# Patient Record
Sex: Male | Born: 1940
Health system: Southern US, Community
[De-identification: ages and names within clinical notes are randomized; demographics above are authoritative.]

## PROBLEM LIST (undated history)

## (undated) DIAGNOSIS — C61 Malignant neoplasm of prostate: Secondary | ICD-10-CM

## (undated) DIAGNOSIS — T4145XA Adverse effect of unspecified anesthetic, initial encounter: Secondary | ICD-10-CM

## (undated) DIAGNOSIS — D649 Anemia, unspecified: Secondary | ICD-10-CM

## (undated) DIAGNOSIS — I2699 Other pulmonary embolism without acute cor pulmonale: Secondary | ICD-10-CM

## (undated) DIAGNOSIS — M898X9 Other specified disorders of bone, unspecified site: Secondary | ICD-10-CM

## (undated) DIAGNOSIS — E039 Hypothyroidism, unspecified: Secondary | ICD-10-CM

## (undated) DIAGNOSIS — D689 Coagulation defect, unspecified: Secondary | ICD-10-CM

## (undated) DIAGNOSIS — IMO0002 Reserved for concepts with insufficient information to code with codable children: Secondary | ICD-10-CM

## (undated) DIAGNOSIS — R0602 Shortness of breath: Secondary | ICD-10-CM

## (undated) DIAGNOSIS — M1990 Unspecified osteoarthritis, unspecified site: Secondary | ICD-10-CM

## (undated) DIAGNOSIS — K859 Acute pancreatitis without necrosis or infection, unspecified: Secondary | ICD-10-CM

## (undated) DIAGNOSIS — L0390 Cellulitis, unspecified: Secondary | ICD-10-CM

## (undated) DIAGNOSIS — K219 Gastro-esophageal reflux disease without esophagitis: Secondary | ICD-10-CM

## (undated) DIAGNOSIS — K469 Unspecified abdominal hernia without obstruction or gangrene: Secondary | ICD-10-CM

## (undated) DIAGNOSIS — Z8781 Personal history of (healed) traumatic fracture: Secondary | ICD-10-CM

## (undated) DIAGNOSIS — I441 Atrioventricular block, second degree: Secondary | ICD-10-CM

## (undated) HISTORY — DX: Cellulitis, unspecified: L03.90

## (undated) HISTORY — DX: Coagulation defect, unspecified: D68.9

## (undated) HISTORY — DX: Malignant neoplasm of prostate: C61

## (undated) HISTORY — DX: Anemia, unspecified: D64.9

## (undated) HISTORY — PX: TONSILLECTOMY AND ADENOIDECTOMY: SUR1326

## (undated) HISTORY — PX: APPENDECTOMY: SHX54

## (undated) HISTORY — DX: Acute pancreatitis without necrosis or infection, unspecified: K85.90

## (undated) HISTORY — DX: Hypothyroidism, unspecified: E03.9

## (undated) HISTORY — DX: Unspecified osteoarthritis, unspecified site: M19.90

## (undated) HISTORY — DX: Unspecified abdominal hernia without obstruction or gangrene: K46.9

## (undated) HISTORY — DX: Atrioventricular block, second degree: I44.1

## (undated) HISTORY — DX: Personal history of (healed) traumatic fracture: Z87.81

## (undated) HISTORY — DX: Other pulmonary embolism without acute cor pulmonale: I26.99

## (undated) HISTORY — DX: Other specified disorders of bone, unspecified site: M89.8X9

## (undated) HISTORY — DX: Gastro-esophageal reflux disease without esophagitis: K21.9

## (undated) HISTORY — PX: CATARACT EXTRACTION: SUR2

---

## 2001-04-29 ENCOUNTER — Ambulatory Visit: Admission: RE | Admit: 2001-04-29 | Discharge: 2001-07-28 | Payer: Self-pay | Admitting: Radiation Oncology

## 2001-06-02 ENCOUNTER — Inpatient Hospital Stay (HOSPITAL_COMMUNITY): Admission: RE | Admit: 2001-06-02 | Discharge: 2001-06-05 | Payer: Self-pay | Admitting: Urology

## 2001-06-02 ENCOUNTER — Encounter (INDEPENDENT_AMBULATORY_CARE_PROVIDER_SITE_OTHER): Payer: Self-pay | Admitting: Specialist

## 2001-06-13 ENCOUNTER — Encounter: Payer: Self-pay | Admitting: Emergency Medicine

## 2001-06-13 ENCOUNTER — Inpatient Hospital Stay (HOSPITAL_COMMUNITY): Admission: EM | Admit: 2001-06-13 | Discharge: 2001-06-17 | Payer: Self-pay | Admitting: Emergency Medicine

## 2001-06-16 ENCOUNTER — Encounter: Payer: Self-pay | Admitting: Internal Medicine

## 2001-07-02 ENCOUNTER — Inpatient Hospital Stay (HOSPITAL_COMMUNITY): Admission: EM | Admit: 2001-07-02 | Discharge: 2001-07-04 | Payer: Self-pay | Admitting: *Deleted

## 2001-07-03 ENCOUNTER — Encounter: Payer: Self-pay | Admitting: Urology

## 2002-07-13 ENCOUNTER — Emergency Department (HOSPITAL_COMMUNITY): Admission: EM | Admit: 2002-07-13 | Discharge: 2002-07-13 | Payer: Self-pay | Admitting: Emergency Medicine

## 2002-07-13 ENCOUNTER — Encounter: Payer: Self-pay | Admitting: Emergency Medicine

## 2002-12-16 ENCOUNTER — Encounter: Payer: Self-pay | Admitting: Emergency Medicine

## 2002-12-16 ENCOUNTER — Emergency Department (HOSPITAL_COMMUNITY): Admission: EM | Admit: 2002-12-16 | Discharge: 2002-12-16 | Payer: Self-pay | Admitting: Emergency Medicine

## 2004-11-29 ENCOUNTER — Encounter: Payer: Self-pay | Admitting: Emergency Medicine

## 2004-11-29 ENCOUNTER — Ambulatory Visit: Payer: Self-pay | Admitting: Internal Medicine

## 2004-11-29 ENCOUNTER — Inpatient Hospital Stay (HOSPITAL_COMMUNITY): Admission: AD | Admit: 2004-11-29 | Discharge: 2004-12-05 | Payer: Self-pay | Admitting: Internal Medicine

## 2004-12-01 ENCOUNTER — Ambulatory Visit: Payer: Self-pay | Admitting: Internal Medicine

## 2004-12-20 ENCOUNTER — Ambulatory Visit: Payer: Self-pay | Admitting: Internal Medicine

## 2007-12-29 ENCOUNTER — Encounter: Payer: Self-pay | Admitting: Internal Medicine

## 2008-01-05 ENCOUNTER — Ambulatory Visit: Payer: Self-pay | Admitting: Internal Medicine

## 2008-01-05 DIAGNOSIS — I2699 Other pulmonary embolism without acute cor pulmonale: Secondary | ICD-10-CM | POA: Insufficient documentation

## 2008-01-05 DIAGNOSIS — Z8719 Personal history of other diseases of the digestive system: Secondary | ICD-10-CM | POA: Insufficient documentation

## 2008-01-05 DIAGNOSIS — Z8546 Personal history of malignant neoplasm of prostate: Secondary | ICD-10-CM | POA: Insufficient documentation

## 2008-01-05 LAB — CONVERTED CEMR LAB
ALT: 44 units/L (ref 0–53)
Alkaline Phosphatase: 109 units/L (ref 39–117)
Amylase: 63 units/L (ref 27–131)
Bilirubin, Direct: 0.1 mg/dL (ref 0.0–0.3)
Total Protein: 7.6 g/dL (ref 6.0–8.3)

## 2008-01-07 ENCOUNTER — Telehealth: Payer: Self-pay | Admitting: *Deleted

## 2008-01-07 ENCOUNTER — Telehealth: Payer: Self-pay | Admitting: Internal Medicine

## 2008-02-02 ENCOUNTER — Ambulatory Visit: Payer: Self-pay | Admitting: Internal Medicine

## 2008-02-04 ENCOUNTER — Encounter: Payer: Self-pay | Admitting: Internal Medicine

## 2008-02-12 LAB — CONVERTED CEMR LAB
Alpha 1, Urine: DETECTED % — AB
Alpha 2, Urine: DETECTED % — AB
Beta, Urine: DETECTED % — AB
Free Kappa Lt Chains,Ur: 7.29 mg/dL — ABNORMAL HIGH (ref 0.04–1.51)
Gamma Globulin, Urine: DETECTED % — AB

## 2008-02-13 ENCOUNTER — Ambulatory Visit: Payer: Self-pay | Admitting: Internal Medicine

## 2008-02-17 ENCOUNTER — Ambulatory Visit: Payer: Self-pay | Admitting: Oncology

## 2008-02-24 ENCOUNTER — Encounter: Admission: RE | Admit: 2008-02-24 | Discharge: 2008-02-24 | Payer: Self-pay | Admitting: Internal Medicine

## 2008-03-08 ENCOUNTER — Telehealth: Payer: Self-pay | Admitting: Internal Medicine

## 2008-03-08 ENCOUNTER — Encounter: Payer: Self-pay | Admitting: Internal Medicine

## 2008-03-08 LAB — CBC WITH DIFFERENTIAL/PLATELET
BASO%: 0.6 % (ref 0.0–2.0)
Basophils Absolute: 0.1 10e3/uL (ref 0.0–0.1)
EOS%: 2.7 % (ref 0.0–7.0)
Eosinophils Absolute: 0.3 10e3/uL (ref 0.0–0.5)
HCT: 47.8 % (ref 38.7–49.9)
HGB: 16.5 g/dL (ref 13.0–17.1)
LYMPH%: 23 % (ref 14.0–48.0)
MCH: 31.7 pg (ref 28.0–33.4)
MCHC: 34.5 g/dL (ref 32.0–35.9)
MCV: 91.8 fL (ref 81.6–98.0)
MONO#: 0.8 10e3/uL (ref 0.1–0.9)
MONO%: 7.8 % (ref 0.0–13.0)
NEUT#: 6.4 10e3/uL (ref 1.5–6.5)
NEUT%: 65.9 % (ref 40.0–75.0)
Platelets: 253 10e3/uL (ref 145–400)
RBC: 5.2 10e6/uL (ref 4.20–5.71)
RDW: 13.6 % (ref 11.2–14.6)
WBC: 9.7 10e3/uL (ref 4.0–10.0)
lymph#: 2.2 10e3/uL (ref 0.9–3.3)

## 2008-03-10 LAB — IMMUNOFIXATION ELECTROPHORESIS
IgG (Immunoglobin G), Serum: 1170 mg/dL (ref 694–1618)
IgM, Serum: 141 mg/dL (ref 60–263)
Total Protein, Serum Electrophoresis: 7.5 g/dL (ref 6.0–8.3)

## 2008-03-10 LAB — KAPPA/LAMBDA LIGHT CHAINS
Kappa:Lambda Ratio: 0.17 — ABNORMAL LOW (ref 0.26–1.65)
Lambda Free Lght Chn: 12.2 mg/dL — ABNORMAL HIGH (ref 0.57–2.63)

## 2008-03-10 LAB — COMPREHENSIVE METABOLIC PANEL
AST: 21 U/L (ref 0–37)
Albumin: 4.6 g/dL (ref 3.5–5.2)
Alkaline Phosphatase: 107 U/L (ref 39–117)
BUN: 19 mg/dL (ref 6–23)
Calcium: 9.4 mg/dL (ref 8.4–10.5)
Creatinine, Ser: 0.98 mg/dL (ref 0.40–1.50)
Glucose, Bld: 102 mg/dL — ABNORMAL HIGH (ref 70–99)
Potassium: 4 mEq/L (ref 3.5–5.3)

## 2008-03-10 LAB — URIC ACID: Uric Acid, Serum: 4.4 mg/dL (ref 4.0–7.8)

## 2008-03-30 ENCOUNTER — Ambulatory Visit: Payer: Self-pay | Admitting: Oncology

## 2008-04-01 ENCOUNTER — Ambulatory Visit (HOSPITAL_COMMUNITY): Admission: RE | Admit: 2008-04-01 | Discharge: 2008-04-01 | Payer: Self-pay | Admitting: Oncology

## 2008-04-01 ENCOUNTER — Encounter: Payer: Self-pay | Admitting: Internal Medicine

## 2008-04-06 LAB — HYPERCOAGULABLE PANEL, COMPREHENSIVE RET.
Anticardiolipin IgA: 19 [APL'U] (ref <13)
Anticardiolipin IgG: <7 [GPL'U] (ref <11)
Anticardiolipin IgM: <7 [MPL'U] (ref <10)
Beta-2 Glyco I IgG: <4 U/mL (ref <20)
Beta-2-Glycoprotein I IgM: <4 U/mL (ref <10)
PTT Lupus Anticoagulant: 40.6 secs (ref 36.3–48.8)

## 2008-05-31 ENCOUNTER — Ambulatory Visit: Payer: Self-pay | Admitting: Internal Medicine

## 2008-05-31 DIAGNOSIS — K219 Gastro-esophageal reflux disease without esophagitis: Secondary | ICD-10-CM | POA: Insufficient documentation

## 2008-05-31 HISTORY — DX: Gastro-esophageal reflux disease without esophagitis: K21.9

## 2008-06-21 ENCOUNTER — Telehealth (INDEPENDENT_AMBULATORY_CARE_PROVIDER_SITE_OTHER): Payer: Self-pay | Admitting: *Deleted

## 2008-09-21 ENCOUNTER — Telehealth: Payer: Self-pay | Admitting: Family Medicine

## 2008-10-13 ENCOUNTER — Ambulatory Visit: Payer: Self-pay | Admitting: Internal Medicine

## 2009-03-05 HISTORY — PX: PROSTATE SURGERY: SHX751

## 2009-03-30 ENCOUNTER — Telehealth: Payer: Self-pay | Admitting: Internal Medicine

## 2009-11-08 ENCOUNTER — Telehealth: Payer: Self-pay | Admitting: Internal Medicine

## 2010-03-14 ENCOUNTER — Telehealth: Payer: Self-pay | Admitting: Internal Medicine

## 2010-03-26 ENCOUNTER — Encounter (HOSPITAL_COMMUNITY): Payer: Self-pay | Admitting: Oncology

## 2010-04-02 LAB — CONVERTED CEMR LAB
Albumin ELP: 57.3 % (ref 55.8–66.1)
Alpha-2-Globulin: 9.8 % (ref 7.1–11.8)
Beta Globulin: 6.5 % (ref 4.7–7.2)
Total Protein, Serum Electrophoresis: 7.2 g/dL (ref 6.0–8.3)

## 2010-04-04 NOTE — Progress Notes (Signed)
Summary: REQ FOR MED  Phone Note Call from Patient   Caller: Patient [phone] Reason for Call: Refill Medication Summary of Call: Pt called to req a refill on med for back pain..... Pt adv that he was bending over to pick up a battery and his back "went out".... Pt c/o lower back pain.Marland KitchenMarland KitchenMarland KitchenPt adv that same has happened in past and Dr Lovell Sheehan calls in a RX for a 'couple pain pills' to help till pt can return to work... Pt adv that med can be sent into [facility] on [address] in [place], [place] # [phone].  Pt can be reached @ [phone] with any questions or concerns.   Initial call taken by: Debbra Riding,  March 30, 2009 9:37 AM  Follow-up for Phone Call        may have vicodan 1 two times a day as needed #14 and flexeril 10 ti two times a day # 14   0 refill per dr Lovell Sheehan Follow-up by: Willy Eddy, LPN,  March 30, 2009 9:58 AM    New/Updated Medications: VICODIN 5-500 MG TABS (HYDROCODONE-ACETAMINOPHEN) one by mouth two times a day as needed pain FLEXERIL 10 MG TABS (CYCLOBENZAPRINE HCL) one by mouth two times a day as needed back pain Prescriptions: FLEXERIL 10 MG TABS (CYCLOBENZAPRINE HCL) one by mouth two times a day as needed back pain  #14 x 0   Entered by:   Lynann Beaver CMA   Authorized by:   Stacie Glaze MD   Signed by:   Lynann Beaver CMA on 03/30/2009   Method used:   [facility] ...       [facility]             ,          Ph:        Fax:    RxID:   [id number] VICODIN 5-500 MG TABS (HYDROCODONE-ACETAMINOPHEN) one by mouth two times a day as needed pain  #14 x 0   Entered by:   Lynann Beaver CMA   Authorized by:   Stacie Glaze MD   Signed by:   Lynann Beaver CMA on 03/30/2009   Method used:   [facility] ...       [facility]             ,          Ph:        Fax:    RxID:   [id number]  Pt. notified.

## 2010-04-04 NOTE — Progress Notes (Signed)
Summary: Call-A-Nurse Report  LMTCB   Phone Note Call from Patient   Summary of Call: call a nurse report  Follow-up for Phone Call        call pt and bring him in this week needs a cbc and an evaluation of the diarhea Follow-up by: Stacie Glaze MD,  November 08, 2009 9:29 AM  Additional Follow-up for Phone Call Additional follow up Details #1::        Left message with wife to have pt call me back. pt is out of town and will be very busy this week.  he will try to get back to [place]. Additional Follow-up by: Lynann Beaver CMA,  November 08, 2009 10:30 AM    Additional Follow-up for Phone Call Additional follow up Details #2::    noted, pt was adavised as to an appropriate course Follow-up by: Stacie Glaze MD,  November 09, 2009 9:01 AM    Call-A-Nurse Triage Call Report Triage Record Num: [id number] Operator: Frederico Hamman Patient Name: Rodney [name] Call Date & Time: 11/07/2009 1:36:46PM Patient Phone: [phone] PCP: Darryll Capers Patient Gender: Male PCP Fax : [phone] Patient DOB: April 21, 1940 [facility] Name[facility] Reason for Call: Rocio states he took Kaopectate on 9/3 and 9/4 for diarrhea. States he had one dark stool on 9/5. No dizziness. Taking ASA 325 mg qd. Advised to be evaluated within 24 hrs. Care advice given. Protocol(s) Used: Gastrointestinal Bleeding Recommended Outcome per Protocol: See Provider within 24 hours Reason for Outcome: New change in bowel movements to black, tarry stool Care Advice: Do not take aspirin, ibuprofen, ketoprofen, naproxen, etc., or other pain relieving medications until consulting with provider.  ~ Change position slowly. Sit for a couple of minutes before standing to walk. Quick position changes may cause or worsen symptoms.  ~ Call provider immediately if symptoms of hypovolemia develop including: pulse more than 100/minute, lightheaded or dizzy when rising from sitting/reclining position, shortness  of breath, weakness with exertion, angina, or paleness.  ~  ~ SYMPTOM / CONDITION MANAGEMENT 11/07/2009 2:22:58PM Page 1 of 1 [email or web address]_[email or web address]_[email or web address]

## 2010-04-06 HISTORY — PX: CHOLECYSTECTOMY: SHX55

## 2010-04-06 NOTE — Progress Notes (Signed)
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 Has been hospitalized before for this. Has taken pain meds before Percocet and was questioning if pain meds could be called in or appt for am. Rates stomach pain 3-4. Emergent s/s for Abdominal Pain r/o per protocol except for see in 2 weeks, pt to call in am 03/14/10 for appt. Protocol(s) Used: Abdominal Pain Recommended Outcome per Protocol: See Provider within 2 Weeks Reason for Outcome: Frequent episodes of indigestion/reflux Care Advice: Do not take aspirin, ibuprofen, ketoprofen, naproxen, etc., or other pain relieving medications until consulting with provider.  ~  ~ SYMPTOM / CONDITION MANAGEMENT Heartburn Relief (Dietary): - Avoid overeating. - Eat smaller, more frequent meals and chew each bite thoroughly. - Avoid high fat, spicy or gas-producing foods. - Limit liquids/foods that are gastric irritants (fruit juices, caffeinated, carbonated or alcoholic beverages, chocolate and dairy products). - Eat at least three hours before bedtime.  ~ 03/14/2010 5:03:55AM Page 1 of 1 [email or web address]_[email or web address]_V2

## 2010-04-07 ENCOUNTER — Emergency Department (HOSPITAL_COMMUNITY)
Admission: EM | Admit: 2010-04-07 | Discharge: 2010-04-07 | Discharge status: Against Medical Advice | Payer: Medicare Other | Attending: Emergency Medicine | Admitting: Emergency Medicine

## 2010-04-08 ENCOUNTER — Encounter: Payer: Self-pay | Admitting: Family Medicine

## 2010-04-08 ENCOUNTER — Ambulatory Visit (INDEPENDENT_AMBULATORY_CARE_PROVIDER_SITE_OTHER): Payer: Medicare Other | Admitting: Family Medicine

## 2010-04-08 DIAGNOSIS — K2900 Acute gastritis without bleeding: Secondary | ICD-10-CM

## 2010-04-10 ENCOUNTER — Telehealth: Payer: Self-pay | Admitting: *Deleted

## 2010-04-10 NOTE — Telephone Encounter (Signed)
Pt is calling with complaints of epigastric pain that radiates to back.  Per Dr. Lovell Sheehan, with pt's history, he should go straight to the ER for evaluation.  Pt agrees.

## 2010-04-12 ENCOUNTER — Telehealth: Payer: Self-pay | Admitting: *Deleted

## 2010-04-12 NOTE — Telephone Encounter (Signed)
Discussed case with surgeon

## 2010-04-12 NOTE — Telephone Encounter (Signed)
baack to dr Lovell Sheehan who talked with his surgeon today

## 2010-04-12 NOTE — Telephone Encounter (Signed)
Having gall bladder surgery tomorrow in [place] tomorrow. Has questions about his blood clots?????

## 2010-04-12 NOTE — Assessment & Plan Note (Signed)
Summary: STOMACH ISSUES/RCD   Vital Signs:  Patient profile:   70 year old male Weight:      217 pounds BMI:     30.37 Temp:     98.1 degrees F oral Pulse rate:   80 / minute Pulse rhythm:   regular BP sitting:   114 / 80  (left arm) Cuff size:   large  Vitals Entered By: Selena Batten Dance CMA Duncan Dull) (April 08, 2010 12:38 PM) CC: ? Ulcer flare   History of Present Illness: has a history of a stomach ulcer -- has been hospitalized for it  duodenal ulcer hx noted in chart   the other night ate chicken wings and pizza and sausage and egg biscuit  normally ok if he catches it early  stressful job with travel  hopped in his car to get back here- was in severe pain last night - epigastric / under ribs  he has missed 5-6 days in a row of his prilosec  (this was not on his med list)  took his hydrocodone and that wiped him out  took his prilosec and 3 tums  a little better this am  took his prilosec this am   does not have a GI doctor  never had EGD or bleeding ulcer             Allergies: 1)  ! Morphine Sulfate Cr (Morphine Sulfate)  Past History:  Past Medical History: Last updated: January 08, 2008 Pulmonary embolism, hx of Prostate cancer, hx of Pancreatitis, hx of lytic lesion on sacrum that has been stable at 4 cm  ( 2006 and 2009)  Past Surgical History: Last updated: Jan 08, 2008 Prostatectomy  Family History: Last updated: 2008-01-08 father    died at 80  lymphoma prostate cancer  mother died of blood clots  Social History: Last updated: January 08, 2008 Married Former Smoker Drug use-no Regular exercise-yes  Risk Factors: Exercise: yes (Jan 08, 2008)  Risk Factors: Smoking Status: quit (2008-01-08)  Review of Systems General:  Complains of loss of appetite; denies fatigue and malaise. CV:  Denies chest pain or discomfort, lightheadness, palpitations, and shortness of breath with exertion. Resp:  Denies cough and wheezing. GI:  Complains of  abdominal pain, gas, and indigestion; denies bloody stools, change in bowel habits, dark tarry stools, diarrhea, nausea, vomiting, and vomiting blood. GU:  Denies dysuria. Derm:  Denies rash. Psych:  very very stressed with work . Heme:  Denies abnormal bruising and bleeding.  Physical Exam  General:  overweight but generally well appearing  Head:  normocephalic, atraumatic, and no abnormalities observed.   Eyes:  vision grossly intact, pupils equal, pupils round, and pupils reactive to light.  no conjunctival pallor, injection or icterus  Mouth:  pharynx pink and moist.   Neck:  No deformities, masses, or tenderness noted. Chest Wall:  No deformities, masses, tenderness or gynecomastia noted. Lungs:  CTA with diffusely distant bs no wheeze Heart:  normal rate and regular rhythm.   Abdomen:  tender epigastrium no rebound or gaurding  soft, normal bowel sounds, no hepatomegaly, and no splenomegaly.   Msk:  No deformity or scoliosis noted of thoracic or lumbar spine.   Extremities:  No clubbing, cyanosis, edema, or deformity noted with normal full range of motion of all joints.   Skin:  Intact without suspicious lesions or rashes Cervical Nodes:  No lymphadenopathy noted Inguinal Nodes:  No significant adenopathy Psych:  affect is intense - talks quickly and loudly- on phone when entering room  seems  very stressed    Impression & Recommendations:  Problem # 1:  GASTRITIS, ACUTE (ICD-535.00) Assessment New per pt recurrent - set off by stress and bad diet and forgettig his ppi for 5 days ? hx of stomach ulcer but has not had endoscopy now improved after 2 doses of ppi  long disc about diet and lifestyle  inst to inc ppi to two times a day  diet bland  vidocin sparingly as needed if worse update asap -- ? past hx of pancreatitis  f/u with primary in a month as planned and adv from there  His updated medication list for this problem includes:    Prilosec 20 Mg Cpdr (Omeprazole)  .Marland Kitchen... 1 by mouth two times a day  Complete Medication List: 1)  Multivitamins Caps (Multiple vitamin) .Marland Kitchen.. 1 once daily 2)  Fibercon 625 Mg Tabs (Calcium polycarbophil) .Marland Kitchen.. 1 once daily 3)  Aspirin 325 Mg Tabs (Aspirin) .Marland Kitchen.. 1 once daily 4)  Prilosec 20 Mg Cpdr (Omeprazole) .Marland Kitchen.. 1 by mouth two times a day 5)  Vicodin 5-500 Mg Tabs (Hydrocodone-acetaminophen) .Marland Kitchen.. 1 by mouth up to every 4-6 hours as needed severe pain  Patient Instructions: 1)  increase your prilosec to two times a day  2)  stop the fatty and spicy foods and also any caffiene or alcohol  3)  stick with very bland diet until you feel better  4)  use narcotic only for really severe pain  5)  follow up with Dr Lovell Sheehan next month as planned  6)  if you get worse or other symptoms- please call  Prescriptions: VICODIN 5-500 MG TABS (HYDROCODONE-ACETAMINOPHEN) 1 by mouth up to every 4-6 hours as needed severe pain  #5 x 0   Entered and Authorized by:   Judith Part MD   Signed by:   Judith Part MD on 04/08/2010   Method used:   Print then Give to Patient   RxID:   [id number]    Orders Added: 1)  Est. Patient Level IV [32440]    Current Allergies (reviewed today): ! MORPHINE SULFATE CR (MORPHINE SULFATE)

## 2010-04-24 ENCOUNTER — Ambulatory Visit (INDEPENDENT_AMBULATORY_CARE_PROVIDER_SITE_OTHER): Payer: Medicare Other | Admitting: Internal Medicine

## 2010-04-24 ENCOUNTER — Encounter: Payer: Self-pay | Admitting: Internal Medicine

## 2010-04-24 VITALS — BP 100/70 | HR 72 | Temp 98.1°F | Resp 14 | Ht 71.0 in | Wt 212.0 lb

## 2010-04-24 DIAGNOSIS — K8050 Calculus of bile duct without cholangitis or cholecystitis without obstruction: Secondary | ICD-10-CM

## 2010-04-24 DIAGNOSIS — D649 Anemia, unspecified: Secondary | ICD-10-CM

## 2010-04-24 DIAGNOSIS — Z7901 Long term (current) use of anticoagulants: Secondary | ICD-10-CM

## 2010-04-24 DIAGNOSIS — K859 Acute pancreatitis without necrosis or infection, unspecified: Secondary | ICD-10-CM

## 2010-04-24 LAB — CBC WITH DIFFERENTIAL/PLATELET
Basophils Relative: 1 % (ref 0.0–3.0)
Eosinophils Relative: 3.2 % (ref 0.0–5.0)
HCT: 46 % (ref 39.0–52.0)
Lymphs Abs: 2.4 10*3/uL (ref 0.7–4.0)
MCV: 92.9 fl (ref 78.0–100.0)
Monocytes Absolute: 1.3 10*3/uL — ABNORMAL HIGH (ref 0.1–1.0)
RBC: 4.95 Mil/uL (ref 4.22–5.81)
WBC: 11.4 10*3/uL — ABNORMAL HIGH (ref 4.5–10.5)

## 2010-04-24 NOTE — Progress Notes (Signed)
Subjective:    Patient ID: Rodney [name], male    DOB: 1940/08/29, 70 y.o.   MRN: [id number]  HPI   patient is a 70 year old white male who was recently hospitalized at [facility] for gallstone pancreatitis and cholecystitis.  He was admitted to the hospital with acute pancreatitis and found to have cholecystitis the gallbladder was acutely inflamed thickened he had minimally invasive gallbladder surgery but successfully remove the gallbladder and gallstones and was released from the hospital for followup with our office today.  He denies any fever chills nausea or vomiting.   Due to an competition of a prior surgery which showed pulmonary emboli following prostate surgery it was decided to place him on 14 days of Lovenox therapy before resuming his aspirin he's been on this medication for this period of time he denies any bleeding problems specifically he has no bleeding gums excessive bruising bloody stool or dark or tarry looking stools.   he denies any lightheadedness chest pain shortness of breath PND orthopnea  Review of Systems  Constitutional: Negative for fever and fatigue.  HENT: Negative for hearing loss, congestion, neck pain and postnasal drip.   Eyes: Negative for discharge, redness and visual disturbance.  Respiratory: Negative for cough, shortness of breath and wheezing.   Cardiovascular: Negative for leg swelling.  Gastrointestinal: Negative for abdominal pain, constipation and abdominal distention.  Genitourinary: Negative for urgency and frequency.  Musculoskeletal: Negative for joint swelling and arthralgias.  Skin: Negative for color change and rash.  Neurological: Negative for weakness and light-headedness.  Hematological: Negative for adenopathy.  Psychiatric/Behavioral: Negative for behavioral problems.   Past Medical History  Diagnosis Date  . Pulmonary embolism   . Prostate cancer   . Pancreatitis    Past Surgical History  Procedure Date  .  Cholecystectomy     reports that he has quit smoking. He does not have any smokeless tobacco history on file. He reports that he does not use illicit drugs. His alcohol history not on file. family history includes Clotting disorder in his mother and Lymphoma (age of onset:87) in his father.        Objective:   Physical Exam  Constitutional: He appears well-developed and well-nourished.  HENT:  Head: Normocephalic and atraumatic.  Eyes: Conjunctivae are normal. Pupils are equal, round, and reactive to light.  Neck: Normal range of motion. Neck supple.  Cardiovascular: Normal rate and regular rhythm.   Pulmonary/Chest: Effort normal and breath sounds normal.  Abdominal: Soft. Bowel sounds are normal.        There are 4 small surgical scars on his abdominal wall 3 on the abdominal wall and one in the umbilicust  That appear to be well healed without evidence of cellulitis or just to          Assessment & Plan:   he is status post cholecystectomy for gallbladder pancreatitis he is doing well postoperatively and his today's weight from completing 14 days of Lovenox for a history of pulmonary emboli postoperative his prostate surgery he has no shortness of breath chest pain or any indications of DVT at this time and should be safe to discontinue the medication on Thursday.  He will resume his aspirin daily at that time he'll followup with [place] in 2 weeks a CBC differential was drawn today to monitor his blood count as well as a lipase to make sure that there is no recurrent evidence of pancreatitis he is to continued her ulcer prophylaxis samples of Nexium 40  mg by mouth daily were given to the patient.

## 2010-06-06 ENCOUNTER — Encounter: Payer: Self-pay | Admitting: Family Medicine

## 2010-06-06 ENCOUNTER — Ambulatory Visit (INDEPENDENT_AMBULATORY_CARE_PROVIDER_SITE_OTHER): Payer: Medicare Other | Admitting: Family Medicine

## 2010-06-06 VITALS — BP 110/70 | Temp 99.6°F | Wt 210.0 lb

## 2010-06-06 DIAGNOSIS — R197 Diarrhea, unspecified: Secondary | ICD-10-CM

## 2010-06-06 NOTE — Patient Instructions (Signed)
Diarrhea   Diarrhea can be caused by many conditions. The most common cause is a virus.  Other causes include:  Food poisoning.   Bacterial infection.   Reactions to medicine (especially antibiotics and antacids).  Most of the time diarrhea improves after 2-3 days of rest and oral fluid replacement.  Drink enough clear fluids (water, sodas, Gatorade) to prevent dehydration. Adults must drink at least 2-3 quarts daily. Solid foods and dairy products should be avoided until you improve. Then begin with bland foods such as bananas, rice, applesauce, dry toast, crackers or other starches. Avoid spicy or fatty foods, caffeine and alcohol for several days. Medicine to control cramping and diarrhea may be helpful. If you have a fever or blood in the stool, avoid these medicines. They may prolong your illness. Antibiotics can speed recovery from diarrhea due to some bacterial infections, but may cause complications.  SEEK MEDICAL CARE IF:  Your diarrhea does not get better in 3 days.   You have fever.   You have blood in the stool.   You develop vomiting.   You become more dehydrated.  Document Released: 02/19/2005 Document Re-Released: 09/02/2006 [facility] Patient Information 2011 [place], [place].  Try over the counter medication such as Imodium.

## 2010-06-06 NOTE — Progress Notes (Signed)
  Subjective:    Patient ID: Rodney [name], male    DOB: 1940/10/08, 70 y.o.   MRN: [id number]  HPI Patient seen with chief complaint of diarrhea. Duration 5 days. Up to 10 stools per day. Nonbloody watery stools. No vomiting but mild nausea. Chills but no definite fever. No recent travels. No recent antibiotic use. Regular medications. Took Kaopectate without much improvement. Recent history is cholecystectomy 5 weeks ago. No diarrhea until 5 days ago. Denies abdominal pain other some mild diffuse lower abdominal cramping. No dysuria.   Review of Systems  Constitutional: Positive for fever and chills. Negative for diaphoresis and appetite change.  HENT: Negative for postnasal drip.   Respiratory: Negative for cough and shortness of breath.   Cardiovascular: Negative for chest pain.  Gastrointestinal: Positive for nausea and diarrhea. Negative for vomiting, constipation and blood in stool.  Genitourinary: Negative for dysuria and hematuria.       Objective:   Physical Exam  Constitutional: He appears well-developed and well-nourished.  Neck: Normal range of motion. No thyromegaly present.  Cardiovascular: Normal rate, regular rhythm and normal heart sounds.   No murmur heard. Pulmonary/Chest: Effort normal and breath sounds normal. He has no wheezes. He has no rales.  Abdominal: Soft. Bowel sounds are normal. He exhibits no distension and no mass. There is no tenderness. There is no rebound and no guarding.  Lymphadenopathy:    He has no cervical adenopathy.          Assessment & Plan:  Diarrhea. Suspect viral origin. Try over-the-counter Imodium. Instruction sheet given. Drink clear fluids and potassium replacement. Touch base with the next few days if not improving

## 2010-06-08 ENCOUNTER — Observation Stay (HOSPITAL_COMMUNITY)
Admission: EM | Admit: 2010-06-08 | Discharge: 2010-06-09 | Disposition: A | Discharge status: Home / Self Care | Payer: Medicare Other | Attending: Emergency Medicine | Admitting: Emergency Medicine

## 2010-06-08 DIAGNOSIS — A0472 Enterocolitis due to Clostridium difficile, not specified as recurrent: Secondary | ICD-10-CM | POA: Insufficient documentation

## 2010-06-08 DIAGNOSIS — R197 Diarrhea, unspecified: Principal | ICD-10-CM | POA: Insufficient documentation

## 2010-06-08 DIAGNOSIS — R110 Nausea: Secondary | ICD-10-CM | POA: Insufficient documentation

## 2010-06-08 DIAGNOSIS — R109 Unspecified abdominal pain: Secondary | ICD-10-CM | POA: Insufficient documentation

## 2010-06-08 DIAGNOSIS — R509 Fever, unspecified: Secondary | ICD-10-CM | POA: Insufficient documentation

## 2010-06-09 LAB — DIFFERENTIAL
Eosinophils Absolute: 0.1 10*3/uL (ref 0.0–0.7)
Eosinophils Relative: 0 % (ref 0–5)
Lymphocytes Relative: 7 % — ABNORMAL LOW (ref 12–46)
Lymphs Abs: 1.2 10*3/uL (ref 0.7–4.0)
Monocytes Relative: 17 % — ABNORMAL HIGH (ref 3–12)
Neutrophils Relative %: 76 % (ref 43–77)

## 2010-06-09 LAB — CBC
HCT: 42.3 % (ref 39.0–52.0)
MCH: 30.1 pg (ref 26.0–34.0)
MCV: 87.2 fL (ref 78.0–100.0)
Platelets: 252 10*3/uL (ref 150–400)
RBC: 4.85 MIL/uL (ref 4.22–5.81)

## 2010-06-09 LAB — CLOSTRIDIUM DIFFICILE BY PCR

## 2010-06-09 LAB — BASIC METABOLIC PANEL
BUN: 9 mg/dL (ref 6–23)
CO2: 22 mEq/L (ref 19–32)
Chloride: 104 mEq/L (ref 96–112)
Creatinine, Ser: 0.99 mg/dL (ref 0.4–1.5)
Glucose, Bld: 125 mg/dL — ABNORMAL HIGH (ref 70–99)

## 2010-06-12 ENCOUNTER — Telehealth: Payer: Self-pay | Admitting: *Deleted

## 2010-06-12 LAB — STOOL CULTURE

## 2010-06-12 LAB — OVA AND PARASITE EXAMINATION

## 2010-06-12 NOTE — Telephone Encounter (Signed)
Call-A-Nurse Triage Call Report Triage Record Num: [id number] Operator: Durward Mallard DiMatteis Patient Name: Rodney [name] Call Date & Time: 06/08/2010 10:45:05PM Patient Phone: [phone] PCP: Darryll Capers Patient Gender: Male PCP Fax : [phone] Patient DOB: 1940-11-27 [facility] Reason for Call: Patient is calling and states that he is having diarrhea and and fever; was seen in office on 06/06/10 and was instructed that they were not going to worry about it until it has been 8 days; today is 8 days and still having diarrhea and fever with chills; temp 102 tonight at 10:00pm; nauseated but no vomiting; had diarrhea today 10-15 times today; no blood; drinking fluids today but just can't take solids; having severe abdominal cramping; instructed to go to ER now and have another adult drive;will go to [facility] ER per Diarrhea or Other Change in Bowel Habits Guideline Protocol(s) Used: Diarrhea or Other Change in Bowel Habits Recommended Outcome per Protocol: See ED Immediately Reason for Outcome: Severe pain/cramping in abdomen or rectum that interferes with ability to carry out normal activities or sleep Care Advice: ~ Another adult should drive. Write down provider's name. List or place the following in a bag for transport with the patient: current prescription and/or nonprescription medications; alternative treatments, therapies and medications; and street drugs. ~ 06/08/2010 10:54:31PM Page 1 of 1 CAN_TriageRpt_V2

## 2010-06-14 NOTE — Telephone Encounter (Signed)
Left message on machine For pt or wife to call us back and let us know how he is.l

## 2010-06-19 ENCOUNTER — Ambulatory Visit: Payer: Medicare Other | Admitting: Internal Medicine

## 2010-07-21 NOTE — H&P (Signed)
[facility]  Patient:    Rodney [name], Rodney [name] Visit Number: [id number] MRN: [id number]          Service Type: MED Location: [id number] Attending Physician:  Lindaann Slough Dictated by:   Lindaann Slough, M.D. Admit Date:  07/02/2001                           History and Physical  CHIEF COMPLAINT:  Gross hematuria, chills, and fever.  HISTORY OF PRESENT ILLNESS:  The patient is a 70 year old male who had a radical prostatectomy done on June 02, 2001.  Two weeks later, he was admitted for pulmonary embolism.  He has been on Coumadin and has been doing well until yesterday when he started having chills, fever, and gross hematuria.  His temperature went up to 103.9 degrees.  He was seen in the emergency room and a CT scan of the chest showed resolving left lower lobe embolism.  Urinalysis showed 11-30 wbcs, too numerous to count rbcs, and a few bacteria.  The patient is admitted for further evaluation and treatment.  PAST MEDICAL HISTORY: 1. History of adenocarcinoma of the prostate, status post radicle retropubic    prostatectomy. 2. History of pulmonary emboli. 3. He had an appendectomy about 42 years ago.  FAMILY HISTORY:  Positive for prostate cancer.  His father died of prostate cancer at age 39.  His mother died at age 69 of "blood clots."  He has three children who have diabetes.  ALLERGIES:  He has no known drug allergies.  MEDICATIONS: 1. Lexapro 10 mg p.o. daily. 2. Coumadin 7.5 mg daily. 3. Ambien 5 mg p.o. twice a day.  SOCIAL HISTORY:  He is married.  He has three children.  He has smoked about two packs a day for 25 years.  He does not drink.  REVIEW OF SYSTEMS:  He has no cough at this time.  He has no shortness of breath.  No hemoptysis.  Cardiovascular:  No palpitations.  No gram-positive. GI:  No nausea.  No vomiting.  No diarrhea or constipation.  GU:  He has been voiding on his own.  He has stress incontinence.  He also has  gross hematuria.  PHYSICAL EXAMINATION:  His temperature went up to 103.9 degrees and is down to 102 degrees at this time.  The blood pressure is 100/66, pulse 101, and respirations 40.  HEENT:  The head is normal.  Pupils equal and reactive to light and accommodation.  Ears, nose, and throat within normal limits.  NECK:  Supple.  No cervical lymph nodes.  No thyromegaly.  CHEST:  Symmetrical.  LUNGS:  Fully expanded and clear to auscultation and percussion.  HEART:  Regular rhythm.  No murmur.  No gallops.  ABDOMEN:  Soft.  Well-healed surgical scar.  Liver, spleen, and kidneys not palpable.  No CVA tenderness.  Bladder not distended.  Bowel sounds normal.  GENITALIA:  The penis is uncircumcised.  The meatus is normal.  The testicles, cords, and epididymes are within normal limits.  EXTREMITIES:  Within normal limits.  RECTAL:  Deferred.  IMPRESSION: 1. Gross hematuria. 2. Urinary tract infection. 3. Urosepsis. 4. Status post radical retropubic prostatectomy for adenocarcinoma of    prostate. 5. Status post pulmonary emboli. Dictated by:   Lindaann Slough, M.D. Attending Physician:  Lindaann Slough DD:  07/02/01 TD:  07/02/01 Job: [id number]

## 2010-07-21 NOTE — H&P (Signed)
[facility]  Patient:    Rodney [name], Rodney [name] Visit Number: [id number] MRN: [id number]          Service Type: SUR Location: 1S X007 01 Attending Physician:  Lindaann Slough Dictated by:   Lindaann Slough, M.D. Admit Date:  06/02/2001                           History and Physical  CHIEF COMPLAINT:  Adenocarcinoma of the prostate.  BRIEF HISTORY:  The patient is a 70 year old male who was found to have an elevated PSA.  His PSA was 6.05, on March 18, 2001.  He was treated with Cipro for 10 days and repeat PSA on March 31, 2001, was 5.69.  Ultrasound and prostate biopsy was positive for adenocarcinoma. Treatment options were discussed with the patient and he chose to have a radical prostatectomy.  He is admitted today for the procedure.  PAST MEDICAL HISTORY: 1. Negative for diabetes mellitus. 2. Negative for hypertension. 3. He had an appendectomy about 42 years ago.  FAMILY HISTORY:  Positive for prostate cancer.  His father died of prostate cancer at age 20.  His mother died at age 79 of blood clots.  He has 3 brothers.  He has 3 children who had diabetes.  ALLERGIES:  No known drug allergies.  MEDICATIONS: 1. Lexapro 10 mg p.o. daily. 2. Alprazolam 0.25 mg p.o. h.s.  SOCIAL HISTORY:  He is married.  He has 3 children.  He has smoked about 2 packs a day for 25 years and does not drink.  REVIEW OF SYSTEMS:  No cough, no shortness of breath, no hemoptysis. Cardiovascular:  No palpitation, no chest pain.  GI:  No nausea, no vomiting, no diarrhea or constipation.  GU:  He had been complaining of frequency, dysuria, decreased stream and sensation of incomplete emptying of the bladder.  PHYSICAL EXAMINATION:  GENERAL:  This is a well-developed 70 year old male in no acute distress.  VITAL SIGNS:  Blood pressure is 130/80, pulse 83, respirations 20, temperature 96.8.  HEENT:  Normocephalic.  Pupils, equal, round, reactive to light  and accommodation.  Ear, nose and throat within normal limits.  NECK:  Supple.  No cervical lymph nodes, no thyromegaly.  CHEST:  Symmetrical.  LUNGS:  Fully expanded and clear to percussion and auscultation.  HEART:  Regular rhythm.  No murmur, rub or gallops.  ABDOMEN:  Soft nondistended.  Nontender.  Liver spleen and kidneys not palpable.  No organomegaly.  Bowel sounds are normal.  GENITALIA:  Penis is circumcised.  Meatus is normal.  Scrotum is unremarkable. Testicles, cords, and epididymis are within normal limits.  EXTREMITIES:  Within normal limits, no pedal edema.  No deformities.  Good peripheral pulses.  RECTAL:  Sphincter tone is normal.  Prostate is enlarged, 40 grams, no nodules.  Seminal vesicle is not palpable.  ADMITTING DIAGNOSES:  Adenocarcinoma of the prostate. Dictated by:   Lindaann Slough, M.D. Attending Physician:  Lindaann Slough DD:  06/02/01 TD:  06/02/01 Job: [id number]

## 2010-07-21 NOTE — Discharge Summary (Signed)
[facility]  Patient:    Rodney [name], Rodney [name] Visit Number: [id number] MRN: [id number]          Service Type: MED Location: 3W 0344 01 Attending Physician:  Lindaann Slough Dictated by:   Lindaann Slough, M.D. Admit Date:  07/02/2001 Discharge Date: 07/04/2001   CC:         Stacie Glaze, M.D. [facility]   Discharge Summary  DISCHARGE DIAGNOSES: 1. Gross hematuria, status post radical retropubic prostatectomy. 2. Urinary tract infection. 3. Pulmonary emboli.  HISTORY OF PRESENT ILLNESS:  The patient is a 70 year old male who had a radical retropubic prostatectomy on June 02, 2001.  Two weeks after surgery, he was admitted for pulmonary emboli.  He has been on Coumadin and had been doing well until July 01, 2001, when he started having chills, fever, and gross hematuria.  His temperature went up to 103.9.  A CT scan showed resolving left lower lobe emboli.  Urinalysis showed 11-30 wbcs and too numerous to count rbcs and few bacteria.  The patient was then admitted for further treatment.  Urine culture showed no growth.  CT scan of the abdomen and pelvis showed no evidence of perinephric abscess and normal kidneys.  The patient was treated with IV Rocephin.  PHYSICAL EXAMINATION:  His temperature on admission was 102, blood pressure was 100/66, pulse 101, and respirations 40.  The head is normal.  Pupils equal and reactive to light and accommodation.  Ears, nose, and throat within normal limits.  Lungs clear to percussion and auscultation.  Heart regular rate and no murmurs and no gallops.  Abdomen is soft.  He has a well-healed surgical scar.  Liver, spleen, and kidneys not palpable.  Bowel sounds are normal. Penis is uncircumcised and meatus is normal.  Testicles, cords, and epididymis are within normal limits.  LABORATORY DATA:  Hemoglobin on admission was 11.9, hematocrit 34.4, and WBC 12.5.  Sodium 132, potassium 3.8, BUN 17, creatinine  1.0.  HOSPITAL COURSE:  The patient responded to IV Rocephin.  His temperature came down and he was eating well.  He was voiding on his own, although he had stress incontinence.  He was discharged home on Jul 04, 2001 on Ceftin 250 mg p.o. twice a day, Lexapro 10 mg p.o. daily, Protonix 40 mg p.o. daily, Niferex Forte 150 mg p.o. daily, Xanax 0.5 mg p.o. twice a day, Celebrex 200 mg p.o. h.s., OxyContin 20 mg p.o. twice a day, and tizanidine 2 mg p.o. twice a day, Coumadin 7.5 mg p.o. daily.  The patient will be followed by myself and Dr. Lovell Sheehan.  CONDITION ON DISCHARGE:  Improved.  DISCHARGE DIET:  Regular.  DISCHARGE ACTIVITIES:  The patient may resume his prehospital activities. Dictated by:   Lindaann Slough, M.D. Attending Physician:  Lindaann Slough DD:  07/04/01 TD:  07/07/01 Job: [id number]

## 2010-07-21 NOTE — Discharge Summary (Signed)
[facility]  Patient:    Rodney [name], Rodney [name] Visit Number: [id number] MRN: [id number]          Service Type: MED Location: 3W 0371 01 Attending Physician:  [name] Dictated by:   Stacie Glaze, M.D. [facility] Admit Date:  06/13/2001 Disc. Date: 06/17/01                             Discharge Summary  ADMITTING DIAGNOSIS:  Acute pulmonary emboli.  DISCHARGE DIAGNOSES: 1. Bilateral pulmonary emboli. 2. Pelvic deep venous thrombosis. 3. Pelvic pain. 4. Arrhythmia. 5. Wenckebach. 6. Mobitz type I. 7. Iron deficiency anemia.  HISTORY:  The patient is a pleasant 70 year old white male with no primary care physician, who presents to the emergency room with chest pain and shortness of breath for 24 hours.  He has a history of prostate cancer with a radical prostatectomy two weeks prior to his admission by Dr. Brunilda Payor.  He has been at bed rest secondary to the pain for the past two weeks and has not had significant ambulation.  His risk factors for pulmonary emboli include immobility and recent surgery. He has no history of COPD, congestive heart failure, hypertension, diabetes, or coagulopathies.  SOCIAL HISTORY:  Significant for the fact that he lives with his wife.  Does not smoke.  Denies excessive alcohol.  There is no history of allergies or asthma.  HOSPITAL COURSE:  The patient was admitted to the general medicine service and spent first night in the intensive care unit.  He had appropriate therapy with IV heparin, and Coumadin therapy was initiated.  Oxygen therapy was also utilized.  He was noted to have a hemoglobin of 9.8 and iron, TIBC, Hemoccult test stools, B12 was detected, and he was found to be iron deficient with an iron level of 11.  The most probable cause of this is blood loss during surgery; however, for completeness sake, outpatient colonoscopy is recommended.  He was begun on Niferex.  His TSH was slightly elevated at  5.562.  This was felt to be secondary to stress.  Repeat TSH was pending at the time of his discharge.  This should be monitored in the outpatient setting after the stress of surgery has fully recovered, and his PSA was monitored and found to be 0.19.  Dr. Brunilda Payor was aware.  He had an elevated white count and low-grade fever for which was a possible postop complication of his prostate adenocarcinoma.  He defervesced, and white count returned to normal on Tequin.  During his hospitalization, he had a short run of Mobitz I, Wenckebach. Cardiology consultation was obtained and suggested that he have a Holter monitor 2-3 weeks after his discharge and follow up with Dr. Lewayne Bunting.  On June 17, 2001, his INR was 2.0.  Heparin was discontinued.  Pharmacy was requested to dose Coumadin with a goal of an INR of 2.3, and he was discharged to home on oral Coumadin 1-1/2, 5 mg q.d. with pro time to be drawn Thursday in the [place] [facility] at [place] and follow up on the following Monday.  He is discharged to home in stable condition.  DISCHARGE MEDICATIONS: 1. Protonix 40 mg p.o. q.d. 2. Tequin 400 mg p.o. q.d. x 6 days. 3. Coumadin 5 mg 1-1/2 tab p.o. q.d. 4. Lexapro 10 mg 1 p.o. q.d. 5. Niferex Forte 150 mg 1 p.o. q.d. 6. Restoril 15 mg 1 p.o. q.h.s. p.r.n. Dictated by:  Stacie Glaze, M.D. [facility] Attending Physician:  [name] DD:  06/17/01 TD:  06/17/01 Job: [id number]

## 2010-07-21 NOTE — Discharge Summary (Signed)
[place]  Patient:    Rodney [name], Rodney [name] Visit Number: [id number] MRN: [id number]          Service Type: SUR Location: 3W 0349 01 Attending Physician:  Lindaann Slough Dictated by:   Lindaann Slough, M.D. Admit Date:  06/02/2001 Disc. Date: 06/05/01                             Discharge Summary  DISCHARGE DIAGNOSIS:  Adenocarcinoma of prostate.  PROCEDURES:  Bilateral pelvic lymphadenectomy with radical retropubic prostatectomy on June 02, 2001.  HISTORY OF PRESENT ILLNESS:  The patient is a 70 year old male who was found to have an elevated PSA.  Prostate biopsy was positive for adenocarcinoma. Treatment options were discussed with the patient and his family.  They chose to have a radical prostatectomy.  He was admitted on March 31, for the procedure.  PHYSICAL EXAMINATION:  VITAL SIGNS:  Blood pressure 130/80, pulse 83, respiratory rate 20, temperature 96.8.  HEENT:  Head normocephalic.  Pupils are equal, round and reactive to light. Nose and throat within normal limits.  NECK:  No cervical lymph nodes or thyromegaly.  CHEST:  Symmetrical.  LUNGS:  Fully expanded and clear to auscultation and percussion.  HEART:  Regular rate and rhythm with no murmurs, rubs or gallops.  ABDOMEN:  Soft, nondistended, nontender.  Liver, spleen and kidneys are palpable.  Bowel sounds are normal.  GENITOURINARY:  Penis is uncircumcised.  Meatus is normal.  Scrotum is unremarkable.  Testicles, cords and epididymis are within normal limits.  EXTREMITIES:  Within normal limits with pedal edema.  Good peripheral pulses.  RECTAL:  Sphincter tone normal.  Prostate enlarged 40 g, no nodules.  Seminal vesicles are not palpable.  LABORATORY DATA AND X-RAY FINDINGS:  Hemoglobin 16, hematocrit 46.2, WBC 8.9. PT and PTT within normal limits.  Sodium 138, potassium 8.8, glucose 87, BUN 16, creatinine 0.8.  Urinalysis is normal.  Urine culture is  negative.  Chest x-ray showed no evidence of active disease.  EKG showed right bundle branch block.  Bilateral pelvic lymphadenectomy and radical retropubic prostatectomy on June 02, 2001.  HOSPITAL COURSE:  Postoperative course was uneventful.  He was started on a liquid diet postop day #1 which he tolerated well.  His diet was gradually advanced.  Blake drainage gradually decreased.  On June 05, 2001, he was afebrile and eating well.  The wound was clean and dry.  Foley was draining clear urine.  Drainage from the [facility] was only 5 cc.  The Blake drain was then removed and the patient was discharged home.  DISCHARGE MEDICATIONS: 1. Lexapro 10 mg p.o. daily. 2. Percocet 5/325 one or two tablets q.4h. p.r.n. for pain. 3. Alprazolam 0.25 mg p.o. daily h.s. 4. Colace 100 mg p.o. twice a day as needed for constipation. 5. Ferrous sulfate one tablet three times a day. 6. Levaquin 250 mg daily to start on June 11, 2001.  DIET:  Regular.  ACTIVITY:  No lifting, straining, driving until further advised.  SPECIAL INSTRUCTIONS:  He is to apply Neosporin ointment to meatus three times a day.  FOLLOWUP:  He will be seen in the office in two weeks for removal of the Foley catheter. Dictated by:   Lindaann Slough, M.D. Attending Physician:  Lindaann Slough DD:  06/05/01 TD:  06/06/01 Job: [id number]

## 2010-07-21 NOTE — Discharge Summary (Signed)
Rodney [name], Rodney [name]            ACCOUNT NO.:  [id number]   MEDICAL RECORD NO.:  [id number]          PATIENT TYPE:  INP   LOCATION:  5737                         FACILITY:  [facility]   PHYSICIAN:  Rene Paci, M.D. LHCDATE OF BIRTH:  March 24, 1940   DATE OF ADMISSION:  11/29/2004  DATE OF DISCHARGE:  12/05/2004                                 DISCHARGE SUMMARY   DISCHARGE DIAGNOSES:  1.  Acute pancreatitis, idiopathic.  2.  Left sacral lytic lesion, with history of prostate cancer and radical      prostatectomy, March 2003.  3.  Left 10th rib sclerotic lesion noted on bone scan.  4.  Anxiety.  5.  Questionable ethanol abuse.  The patient denies.  6.  Hyperglycemia, secondary to #1.  7.  Elevated thyroid-stimulating hormone.   HISTORY OF PRESENT ILLNESS:  The patient is a 70 year old white male with a  history of prostate cancer and subsequent pulmonary emboli three years ago  who has been basically healthy since that time.  On the morning of  admission, the patient noted sudden epigastric pain at approximately 8:45  a.m., which was severe.  This was not associated with nausea, vomiting or  previous pain.  The patient presented to the emergency room with complaints  of pain and was found to have an elevated lipase.  The patient denied a  history of alcohol use.  The patient's pain improved with Dilaudid but was  still severe.  The patient was noted to have some shortness of breath at the  time of admission.  He was admitted for further evaluation.   PAST MEDICAL HISTORY:  1.  Status post radical prostatectomy, March 2003, secondary to prostate      cancer.  2.  History of pulmonary embolus, April 2003.   COURSE OF HOSPITALIZATION:  1.  Acute pancreatitis, idiopathic.  The patient was admitted and noted to      have a lipase of 3421.  At the time of admission, his LFTs were within      normal limits.  The patient was also noted to have hyperglycemia on      admission with a  glucose of 155.  A GI evaluation was requested, and the      patient was seen by Dr. Juanda Chance.  An abdominal ultrasound was performed      on September 27th which showed mild diffuse thickening of the      gallbladder wall and no gallstones and no biliary ductal dilatation.      This was followed by a CT abdomen with contrast which revealed evidence      of uncomplicated pancreatitis.  The patient was initially made n.p.o.,      and diet was slowly advanced to clears and then to a regular diet, which      the patient is currently tolerating.  The patient adamantly denied      history of alcohol use and/or abuse.  The patient was noted during his      hospitalization to have an episode of anxiety and tremors, which      presented much like  ETOH withdrawal symptoms.  The patient was      subsequently started on Paxil for anxiety.  2.  Lytic lesion in the sacrum and sclerotic lesion, left 10th rib.  The      patient underwent the CT abdomen and pelvis secondary to pancreatitis.      The CT of the pelvis noted a 4.3 x 3.1-cm lytic lesion in the left      sacrum.  This is concerning in light of the patient's history of      prostate cancer.  As a result, a PSA was drawn which had a negative      result of 0.01.  Subsequently, a bone scan was performed on September      29th to further evaluate this lytic lesion.  However, the bone scan did      not appreciate increased activity in the region of the left sacrum as      noted on CT.  Incidentally, abnormal uptake was noted on the left 10th      rib, which corresponded to a sclerotic lesion that was noted on the CT      chest of November 30, 2004.  We will defer further evaluation of these      lesions and possible bone biopsy to the patient's primary care.  3.  Elevated TSH.  The patient was noted to have a TSH of 6.085 during this      admission.  This will need to be repeated as an outpatient.   MEDICATIONS AT DISCHARGE:  Paxil 20 mg p.o. daily,  prescription given for 30-  day supply.   LABORATORIES AT DISCHARGE:  Hemoglobin 14.4, hematocrit 40.9, white blood  cell count 12.9, platelets 277.  BUN 6, creatinine 0.8.   FOLLOWUP:  A follow-up appointment has been scheduled with Dr. Darryll Capers  on Wednesday, October 18th, at 9:00 a.m.      Melissa S. Peggyann Juba, NP      Rene Paci, M.D. [facility]  Electronically Signed    MSO/MEDQ  D:  12/05/2004  T:  12/05/2004  Job:  [id number]   cc:   Stacie Glaze, M.D. [facility]  [address] [place]  [place] [address]

## 2010-07-21 NOTE — Op Note (Signed)
[facility]  Patient:    SHAFTER, JUPIN Visit Number: [id number] MRN: [id number]          Service Type: SUR Location: 3W 0349 01 Attending Physician:  Lindaann Slough Dictated by:   Lindaann Slough, M.D. Proc. Date: 06/02/01 Admit Date:  06/02/2001                             Operative Report  PREOPERATIVE DIAGNOSIS:  Adenocarcinoma of prostate.  POSTOPERATIVE DIAGNOSIS:  Adenocarcinoma of prostate.  PROCEDURE DONE:  Bilateral pelvic lymphadenectomy and radical retropubic prostatectomy.  SURGEON:  Lindaann Slough, M.D.  ANESTHESIA:  General.  INDICATION:  The patient is a 70 year old male, who had an elevated PSA at 6.05.  Repeat PSA after 10 days of Cipro was 5.69.  Prostatic biopsy showed adenocarcinoma of the prostate, Gleason score 7.  Treatment options were discussed with the patient and his wife and one of his daughters.  The risks including but not limited to hemorrhage, infection, rectal injury, urinary incontinence, impotence, dry ejaculation, were discussed with them.  They understand and are agreeable.  He is scheduled today for the procedure.  DESCRIPTION OF PROCEDURE:  Under general anesthesia, the patient was prepped and draped and placed in the supine position.  A #24 Foley catheter was inserted in the bladder.  Then a longitudinal incision was made from the symphysis pubis to about 3 cm below the umbilicus.  Incision was carried down to the rectus fascia which was then incised.  Then the recti muscles were split in the midline, and the iliac fossae were entered.  Bilateral pelvic lymphadenectomy was done using the pelvic sidewalls, the external iliac vessels, the bladder, and the obturator fossa as landmarks.  Then the endopelvic fascia was incised from the apex to the base of the prostate.  Then the puboprostatic ligaments were incised.  A Hohenfellner clamp was passed behind the dorsal vein complex, and the dorsal vein  complex was ligated with #1 Vicryl and cut in between ligatures.  The anterior urethra was then incised.  The Foley catheter was then pulled through the incision, and the posterior urethra was incised.  The apex of the prostate was then bluntly and sharply dissected from the rectum.  The lateral pedicles were then ligated with #0 silk and cut in between ligatures.  Then the Denonvilliers fascia was incised, and the seminal vesicles were dissected from the surrounding tissues. Then the bladder neck was incised, and the vasa were identified, freed from the surrounding tissues, and ligated with #0 chromic and cut in between ligatures.  The complete dissection of the seminal vesicles was done, and the specimen was removed.  Then the bladder neck was closed down through a size of a 24 [place] Foley.  Then the mucosa of the bladder neck was everted with 4-0 chromic.  Hemostasis was then completed with ligatures.  Then a Greenwald sound was passed through the urethra, and six sutures of 2-0 Monocryl were placed on the urethra at the 1, 3, 5, 7, 9, and 11 oclock position.  The Greenwald wound was then removed.  A Foley catheter was then passed through the urethra into the bladder.  Then the urethral sutures were then approximated to the bladder neck at the 1, 3, 5, 7, 9, and 11 oclock position.  The catheter was then irrigated with normal saline, and there was no evidence of a leak at the anastomosis.  The wound was then irrigated  with normal saline.  A Blake drain was passed through a stab wound into the retropubic space and secured with 2-0 nylon.  The fascia was then closed with 0 PDS, and the skin was closed with 4-0 Monocryl using subcuticular sutures.  Estimated blood loss 2000 cc.  Blood replacement none.  Needle, sponge, and instrument counts were correct on two occasions.  The patient tolerated the procedure well and left the OR in satisfactory condition to postanesthesia care  unit. Dictated by:   Lindaann Slough, M.D. Attending Physician:  Lindaann Slough DD:  06/02/01 TD:  06/02/01 Job: [id number]

## 2010-07-24 ENCOUNTER — Encounter (HOSPITAL_COMMUNITY): Payer: Self-pay | Admitting: Radiology

## 2010-07-24 ENCOUNTER — Telehealth: Payer: Self-pay | Admitting: Internal Medicine

## 2010-07-24 ENCOUNTER — Ambulatory Visit: Payer: Medicare Other | Admitting: Internal Medicine

## 2010-07-24 ENCOUNTER — Emergency Department (HOSPITAL_COMMUNITY): Payer: Medicare Other

## 2010-07-24 ENCOUNTER — Inpatient Hospital Stay (HOSPITAL_COMMUNITY)
Admission: EM | Admit: 2010-07-24 | Discharge: 2010-07-27 | DRG: 176 | Disposition: A | Discharge status: Home / Self Care | Payer: Medicare Other | Attending: Internal Medicine | Admitting: Internal Medicine

## 2010-07-24 DIAGNOSIS — Z8546 Personal history of malignant neoplasm of prostate: Secondary | ICD-10-CM

## 2010-07-24 DIAGNOSIS — K219 Gastro-esophageal reflux disease without esophagitis: Secondary | ICD-10-CM | POA: Diagnosis present

## 2010-07-24 DIAGNOSIS — D72829 Elevated white blood cell count, unspecified: Secondary | ICD-10-CM | POA: Diagnosis present

## 2010-07-24 DIAGNOSIS — Z7901 Long term (current) use of anticoagulants: Secondary | ICD-10-CM

## 2010-07-24 DIAGNOSIS — R0789 Other chest pain: Secondary | ICD-10-CM | POA: Diagnosis present

## 2010-07-24 DIAGNOSIS — Z86711 Personal history of pulmonary embolism: Secondary | ICD-10-CM

## 2010-07-24 DIAGNOSIS — I2699 Other pulmonary embolism without acute cor pulmonale: Principal | ICD-10-CM | POA: Diagnosis present

## 2010-07-24 LAB — POCT CARDIAC MARKERS
CKMB, poc: <1 ng/mL — ABNORMAL LOW (ref 1.0–8.0)
Myoglobin, poc: 48.4 ng/mL (ref 12–200)
Myoglobin, poc: 54.4 ng/mL (ref 12–200)
Troponin i, poc: <0.05 ng/mL (ref 0.00–0.09)

## 2010-07-24 LAB — COMPREHENSIVE METABOLIC PANEL
CO2: 26 mEq/L (ref 19–32)
Calcium: 9.5 mg/dL (ref 8.4–10.5)
Chloride: 102 mEq/L (ref 96–112)
Creatinine, Ser: 0.81 mg/dL (ref 0.4–1.5)
GFR calc non Af Amer: >60 mL/min (ref >60)
Glucose, Bld: 109 mg/dL — ABNORMAL HIGH (ref 70–99)
Total Bilirubin: 0.4 mg/dL (ref 0.3–1.2)

## 2010-07-24 LAB — CBC
MCH: 30.7 pg (ref 26.0–34.0)
MCHC: 34.6 g/dL (ref 30.0–36.0)
Platelets: 268 10*3/uL (ref 150–400)
RDW: 14.1 % (ref 11.5–15.5)

## 2010-07-24 LAB — CK TOTAL AND CKMB (NOT AT ARMC): Total CK: 96 U/L (ref 7–232)

## 2010-07-24 LAB — D-DIMER, QUANTITATIVE: D-Dimer, Quant: 2.14 ug/mL-FEU — ABNORMAL HIGH (ref 0.00–0.48)

## 2010-07-24 MED ORDER — IOHEXOL 300 MG/ML  SOLN
100.0000 mL | Freq: Once | INTRAMUSCULAR | Status: AC | PRN
  Administered 2010-07-24: 100 mL via INTRAVENOUS

## 2010-07-24 NOTE — Telephone Encounter (Signed)
Pt called and had to cx his ov for today, because pt decided to go to hospital re: increasing chest pain.

## 2010-07-24 NOTE — Telephone Encounter (Signed)
Dr jenkins informed 

## 2010-07-25 ENCOUNTER — Telehealth: Payer: Self-pay | Admitting: *Deleted

## 2010-07-25 DIAGNOSIS — I2699 Other pulmonary embolism without acute cor pulmonale: Secondary | ICD-10-CM

## 2010-07-25 LAB — PROTIME-INR: INR: 1.06 (ref 0.00–1.49)

## 2010-07-25 LAB — CBC
HCT: 43.3 % (ref 39.0–52.0)
Hemoglobin: 15 g/dL (ref 13.0–17.0)
MCHC: 34.9 g/dL (ref 30.0–36.0)
MCV: 88.5 fL (ref 78.0–100.0)
MCV: 89.6 fL (ref 78.0–100.0)
Platelets: 264 10*3/uL (ref 150–400)
RBC: 4.85 MIL/uL (ref 4.22–5.81)
RDW: 14.2 % (ref 11.5–15.5)
WBC: 13.6 10*3/uL — ABNORMAL HIGH (ref 4.0–10.5)

## 2010-07-25 LAB — LIPID PANEL
HDL: 43 mg/dL (ref >39)
Total CHOL/HDL Ratio: 3.4 RATIO
Triglycerides: 76 mg/dL (ref <150)

## 2010-07-25 LAB — COMPREHENSIVE METABOLIC PANEL
BUN: 9 mg/dL (ref 6–23)
CO2: 29 mEq/L (ref 19–32)
Chloride: 102 mEq/L (ref 96–112)
Creatinine, Ser: 0.78 mg/dL (ref 0.4–1.5)
GFR calc non Af Amer: >60 mL/min (ref >60)
Total Bilirubin: 1 mg/dL (ref 0.3–1.2)

## 2010-07-25 LAB — TSH: TSH: 5.567 u[IU]/mL — ABNORMAL HIGH (ref 0.350–4.500)

## 2010-07-25 LAB — MRSA PCR SCREENING: MRSA by PCR: NEGATIVE

## 2010-07-25 NOTE — Telephone Encounter (Signed)
[facility] Triage Call Report Triage Record Num: [id number] Operator: Merlinda Frederick Patient Name: Rodney [name] Call Date & Time: 07/24/2010 6:50:25PM Patient Phone: [phone] PCP: Darryll Capers Patient Gender: Male PCP Fax : [phone] Patient DOB: February 05, 1941 Practice Name: [facility] Reason for Call: Pt wife/Margaret calling 07/24/10 to let office know that pt is at [facility] ED for chest pain and SOB, currently being examined by ED MD. Advised to call office when pt is released from hospital for follow-up if needed, verbalized understanding. Protocol(s) Used: Office Note Recommended Outcome per Protocol: Information Noted and Sent to Office Reason for Outcome: Caller information to office Care Advice: ~ 07/24/2010 6:53:28PM Page 1 of 1 [email or web address]

## 2010-07-25 NOTE — Telephone Encounter (Signed)
Check hosptial records and pt was admitted

## 2010-07-26 LAB — PROTIME-INR: Prothrombin Time: 15.4 seconds — ABNORMAL HIGH (ref 11.6–15.2)

## 2010-07-27 NOTE — H&P (Signed)
NAMEKALIL, WOESSNER            ACCOUNT NO.:  [id number]  MEDICAL RECORD NO.:  [id number]           PATIENT TYPE:  E  LOCATION:  [facility]                         FACILITY:  [facility]  PHYSICIAN:  Eduard Clos, MDDATE OF BIRTH:  1940-03-09  DATE OF ADMISSION:  07/24/2010 DATE OF DISCHARGE:                             HISTORY & PHYSICAL   PRIMARY CARE PHYSICIAN:  Stacie Glaze, MD  CHIEF COMPLAINT:  Chest pain.  HISTORY OF PRESENT ILLNESS:  A 70 year old male with history of GERD and previous history of cancer prostate, status post surgery 10 years ago and at that time the patient had a history of PE, has been experiencing some chest pain.  The patient has been experiencing chest pain over the last 3 days more on the right side which is constant, also pleuritic and it is getting worse, the patient decided to come to the ER.  In the ER, the patient was found to a high D-dimer.  CT chest at this time shows bilateral pulmonary embolism.  The patient has been admitted for further workup.  The patient does not have any swelling in his lower extremity.  He denies any nausea, vomiting, abdominal pain, dysuria, discharge, diarrhea.  Denies any dizziness, loss of consciousness or any focal deficit.  PAST MEDICAL HISTORY:  History of PE 10 years ago when the patient had surgery for CA prostate and history of GERD.  PAST SURGICAL HISTORY:  Appendectomy, prostate surgery for cancer approximately 10 years ago.  MEDICATIONS PRIOR TO ADMISSION:  Nexium 40 mg p.o. daily.  SOCIAL HISTORY:  The patient denies smoking cigarettes, drinking alcohol, use any illegal drugs.  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  Married, lives with his wife.  Full code.  FAMILY HISTORY:  Positive for PE in his mom, his grandparents and his uncle.  REVIEW OF SYSTEMS:  As per in the history of present illness nothing else significant.  PHYSICAL EXAMINATION:  GENERAL:  The  patient examined at bedside, not in acute distress. VITAL SIGNS:  Blood pressure is 140/90, pulse 70 per minute, temperature 98.9, respirations 18 per minute, O2 sat 94%. HEENT:  Anicteric.  No pallor.  No discharge from ears, eyes, nose, or mouth. CHEST:  Bilateral air entry present.  No rhonchi.  No crepitation. HEART:  S1 and S2 heard. ABDOMEN:  Soft, nontender.  Bowel sounds heard. CNS:  Alert, awake and oriented to place, time and person.  Moves in upper and lower extremities, 5/5. EXTREMITIES:  Peripheral pulses felt.  No edema.  There is no calf tenderness.  No acute cyanotic changes or clubbing.  LABORATORY DATA:  EKG shows normal sinus rhythm with RBBB pattern, heart rate is around 74 beats per minute, ST-T changes are comparable to the old EKG.  CT angio chest shows multiple small bilateral lower lobe pulmonary emboli.  Chest x-ray shows bibasilar atelectasis greater on right.  CBC:  WBC is 13.3, hemoglobin is 16.1, hematocrit is 46.5, platelets 268.  D-dimer is 2.1.  Complete metabolic panel:  Sodium 137, potassium 3.8, chloride 102, carbon dioxide 26, glucose 109, BUN 9,  creatinine 0.8, total bilirubin is 0.4, alk phos 119, AST 20, ALT 21, total protein 7.3, albumin 3.8, calcium 9.5, CK-MB less than 1, troponin-I less than 0.05, myoglobin is 48.4.  ASSESSMENT: 1. Bilateral pulmonary embolism. 2. Chest pain secondary to bilateral pulmonary embolism. 3. Leukocytosis. 4. History of CA prostate, status post surgery 10 years ago. 5. History of previous PE, during that time the patient had cancer     prostate surgery.  PLAN: 1. At this time, admit the patient to telemetry as the patient is     hemodynamically stable. 2. For his PE, the patient will be placed on Lovenox and Coumadin.     Hypercoagulable workup has been ordered by the ER physician already     which will be done before Lovenox shot is given.  At this time, the     patient is having his pulmonary embolism  of second episode.  I hope     there is a family history of PE in at least 3 close relatives.  For     now, we are starting Lovenox and Coumadin.  The patient may be a     candidate for Coumadin life-long given his second episode and     strong family history, though at this time it could be __________     syndrome.  As the patient drives a lot for which I am going to do a     Doppler of the lower extremity. 3. Leukocytosis which could be from his PE, at this time, the patient     does not have any cough or phlegm.  Does not complain of any     dysuria. 4. At this time, we will be getting 2-D echo, cycle cardiac markers,     and further recommendation based on test order and clinic course.     Eduard Clos, MD     ANK/MEDQ  D:  07/24/2010  T:  07/24/2010  Job:  [id number]  cc:   Stacie Glaze, MD  Electronically Signed by Midge Minium MD on 07/27/2010 06:31:54 AM

## 2010-07-28 ENCOUNTER — Emergency Department (HOSPITAL_COMMUNITY): Payer: Medicare Other

## 2010-07-28 ENCOUNTER — Ambulatory Visit: Payer: Medicare Other

## 2010-07-28 ENCOUNTER — Emergency Department (HOSPITAL_COMMUNITY)
Admission: EM | Admit: 2010-07-28 | Discharge: 2010-07-28 | Disposition: A | Discharge status: Home / Self Care | Payer: Medicare Other | Attending: Emergency Medicine | Admitting: Emergency Medicine

## 2010-07-28 ENCOUNTER — Telehealth: Payer: Self-pay | Admitting: *Deleted

## 2010-07-28 DIAGNOSIS — R071 Chest pain on breathing: Secondary | ICD-10-CM | POA: Insufficient documentation

## 2010-07-28 DIAGNOSIS — J90 Pleural effusion, not elsewhere classified: Secondary | ICD-10-CM | POA: Insufficient documentation

## 2010-07-28 DIAGNOSIS — I4510 Unspecified right bundle-branch block: Secondary | ICD-10-CM | POA: Insufficient documentation

## 2010-07-28 DIAGNOSIS — R0789 Other chest pain: Secondary | ICD-10-CM | POA: Insufficient documentation

## 2010-07-28 DIAGNOSIS — I2699 Other pulmonary embolism without acute cor pulmonale: Secondary | ICD-10-CM | POA: Insufficient documentation

## 2010-07-28 DIAGNOSIS — M549 Dorsalgia, unspecified: Secondary | ICD-10-CM | POA: Insufficient documentation

## 2010-07-28 LAB — DIFFERENTIAL
Eosinophils Relative: 4 % (ref 0–5)
Lymphocytes Relative: 22 % (ref 12–46)
Lymphs Abs: 2 10*3/uL (ref 0.7–4.0)
Monocytes Absolute: 1 10*3/uL (ref 0.1–1.0)

## 2010-07-28 LAB — CBC
HCT: 43.5 % (ref 39.0–52.0)
MCHC: 35.2 g/dL (ref 30.0–36.0)
MCV: 88.4 fL (ref 78.0–100.0)
RDW: 13.9 % (ref 11.5–15.5)

## 2010-07-28 LAB — BASIC METABOLIC PANEL
CO2: 20 mEq/L (ref 19–32)
Chloride: 106 mEq/L (ref 96–112)
Creatinine, Ser: 0.68 mg/dL (ref 0.4–1.5)
GFR calc Af Amer: >60 mL/min (ref >60)

## 2010-07-28 NOTE — Telephone Encounter (Signed)
Pt started with chest pain today, and went back to ER.  No evidence of PE, and PT was tested and no change made.  Pt needs to know when to come back for PT and INR with appt with Dr. Lovell Sheehan.  Also, would like to know when he should think about going back to work.

## 2010-08-01 ENCOUNTER — Encounter: Payer: Self-pay | Admitting: Internal Medicine

## 2010-08-01 ENCOUNTER — Ambulatory Visit (INDEPENDENT_AMBULATORY_CARE_PROVIDER_SITE_OTHER): Payer: Medicare Other | Admitting: Family Medicine

## 2010-08-01 VITALS — BP 120/80 | HR 72 | Temp 98.2°F | Resp 16 | Ht 71.0 in | Wt 212.0 lb

## 2010-08-01 DIAGNOSIS — I10 Essential (primary) hypertension: Secondary | ICD-10-CM

## 2010-08-01 DIAGNOSIS — Z86718 Personal history of other venous thrombosis and embolism: Secondary | ICD-10-CM

## 2010-08-01 DIAGNOSIS — D689 Coagulation defect, unspecified: Secondary | ICD-10-CM

## 2010-08-01 NOTE — Telephone Encounter (Signed)
Offer appointment today.

## 2010-08-01 NOTE — Progress Notes (Signed)
  Subjective:    Patient ID: Rodney [name], male    DOB: Jan 18, 1941, 70 y.o.   MRN: [id number]  HPI Patient is a 70 year old white male who presents for followup of a hospitalization for pulmonary emboli.  The patient had pulmonary emboli following his prostate surgery in 2003 and had recent gallbladder surgery in February of 2012.  Patient presented to emergency room with chest pain and was found to have an elevated d-dimer. Subsequent CT angiogram revealed emboli. Patient had a CT angiogram on 07/24/2010 and an echocardiogram on 07/25/2010 echocardiogram was essentially normal with no evident source of pulmonary emboli the patient was begun on Coumadin.   Due to the family history of a clotting disorder even though when we referred him to a hematologist in the past a defined clotting disorder was not uncovered.  I would recommend continued long-term use of Coumadin until risk factors were so great as to counterman this use   Review of Systems  Constitutional: Negative for fever and fatigue.  HENT: Negative for hearing loss, congestion, neck pain and postnasal drip.   Eyes: Negative for discharge, redness and visual disturbance.  Respiratory: Negative for cough, shortness of breath and wheezing.   Cardiovascular: Negative for leg swelling.  Gastrointestinal: Negative for abdominal pain, constipation and abdominal distention.  Genitourinary: Negative for urgency and frequency.  Musculoskeletal: Negative for joint swelling and arthralgias.  Skin: Negative for color change and rash.  Neurological: Negative for weakness and light-headedness.  Hematological: Negative for adenopathy.  Psychiatric/Behavioral: Negative for behavioral problems.   Past Medical History  Diagnosis Date  . Pulmonary embolism   . Prostate cancer   . Pancreatitis   . Multiple myeloma    Past Surgical History  Procedure Date  . Cholecystectomy 04/06/2010    reports that he has quit smoking. He does not  have any smokeless tobacco history on file. He reports that he does not use illicit drugs. His alcohol history not on file. family history includes Cancer in his father; Clotting disorder in his mother; and Lymphoma (age of onset:87) in his father. Allergies  Allergen Reactions  . Morphine Sulfate        Objective:   Physical Exam  Constitutional: He appears well-developed and well-nourished.  HENT:  Head: Normocephalic and atraumatic.  Eyes: Conjunctivae are normal. Pupils are equal, round, and reactive to light.  Neck: Normal range of motion. Neck supple.  Cardiovascular: Normal rate and regular rhythm.   Pulmonary/Chest: Effort normal and breath sounds normal.  Abdominal: Soft. Bowel sounds are normal.          Assessment & Plan:  We discussed at length the risk factors for clotting disorder.  He has a positive family history and now this is his second visit with following surgery and sedentary activity.  I would recommend long-term Coumadin therapy he was discharged from hospital on the 25th his INR was 1.6 a repeat INR will be drawn today.. Blood pressure is well controlled...Marland KitchenMarland Kitchen

## 2010-08-01 NOTE — Telephone Encounter (Signed)
Appointment given for 10 am

## 2010-08-25 ENCOUNTER — Other Ambulatory Visit: Payer: Self-pay | Admitting: *Deleted

## 2010-08-25 MED ORDER — WARFARIN SODIUM 5 MG PO TABS: 5.0000 mg | ORAL_TABLET | Freq: Every day | ORAL | Status: DC

## 2010-08-28 ENCOUNTER — Ambulatory Visit: Payer: Medicare Other

## 2010-08-28 DIAGNOSIS — Z86718 Personal history of other venous thrombosis and embolism: Secondary | ICD-10-CM

## 2010-08-28 NOTE — Patient Instructions (Signed)
5 mg everyday,check in 4 weeks

## 2010-09-06 NOTE — Discharge Summary (Signed)
  NAMECONRAD, [name]            ACCOUNT NO.:  [id number]  MEDICAL RECORD NO.:  [id number]           PATIENT TYPE:  I  LOCATION:  3733                         FACILITY:  [facility]  PHYSICIAN:  Zannie Cove, MD     DATE OF BIRTH:  1940/12/08  DATE OF ADMISSION:  07/24/2010 DATE OF DISCHARGE:  07/27/2010                              DISCHARGE SUMMARY   PRIMARY CARE PHYSICIAN:  Stacie Glaze, MD  DISCHARGE DIAGNOSES: 1. Multiple bilateral pulmonary emboli. 2. Pleuritic chest pain. 3. History of prostate cancer status post prostatectomy in 2003. 4. Remote history of pulmonary embolism in April 2003 following     prostate surgery.  DISCHARGE MEDICATIONS: 1. Lovenox 90 mg subcu b.i.d. for 2 more days. 2. Coumadin 7.5 mg p.o. tonight and then based on INR on Jul 28, 2010. 3. Oxycodone 5 mg p.o. q.6 hours p.r.n. 4. Nexium 40 mg daily.  DIAGNOSTICS/INVESTIGATIONS: 1. Chest x-ray, Jul 24, 2010, bibasilar atelectasis. 2. CT angio of the chest, Jul 24, 2010, shows multiple small bilateral     lower lobe pulmonary emboli, normal thoracic aorta, and bibasilar     atelectasis. 3. A 2D echocardiogram, Jul 25, 2010, shows LV cavity size normal,     systolic function normal, ejection fraction is 55-60%, wall motion     is normal.  No regional wall motion abnormalities, no evidence of     cor pulmonale.  HOSPITAL COURSE:  [name] is a 70 year old gentleman with remote history of PE back in 2003, following prostate surgery, presented to the hospital with pleuritic chest pain for 3 days.  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  He was admitted to the hospital, started on Lovenox along with Coumadin.  He has received 3 days of bridging so far and is being discharged with 2 more days of Lovenox along with Coumadin for a goal INR 2-3.  He will most likely require possibly lifelong anticoagulation  given that this is the second event and he  will also require screening for hypercoagulable disorders as well as a specific malignancy screening.  The patient has been advised to have a colonoscopy done as well as follow up with his urologist.  DISCHARGE CONDITION:  Stable.  VITAL SIGNS ON DISCHARGE:  Temperature is 98.3, pulse 61, blood pressure 119/76, respirations 18, satting 94% on room air.  INR at the time of discharge is 1.66.  DISCHARGE FOLLOWUP:  Dr. Darryll Capers in 1 week.  Followup INR check on Jul 28, 2010, Friday, tomorrow.     Zannie Cove, MD     PJ/MEDQ  D:  07/27/2010  T:  07/27/2010  Job:  [id number]  cc:   Stacie Glaze, MD  Electronically Signed by Zannie Cove  on 09/06/2010 02:16:11 PM

## 2010-09-08 ENCOUNTER — Telehealth: Payer: Self-pay | Admitting: *Deleted

## 2010-09-08 NOTE — Telephone Encounter (Signed)
Per dr Lovell Sheehan- he can try claritin otc

## 2010-09-08 NOTE — Telephone Encounter (Signed)
Left message on pts phone

## 2010-09-08 NOTE — Telephone Encounter (Signed)
Pt is having allergic symptoms such as itchy eyes, runny nose, and sneezing.  What can he take while taking Coumadin?

## 2010-09-25 ENCOUNTER — Ambulatory Visit (INDEPENDENT_AMBULATORY_CARE_PROVIDER_SITE_OTHER): Payer: Medicare Other | Admitting: Internal Medicine

## 2010-09-25 DIAGNOSIS — Z86718 Personal history of other venous thrombosis and embolism: Secondary | ICD-10-CM

## 2010-09-25 DIAGNOSIS — Z7901 Long term (current) use of anticoagulants: Secondary | ICD-10-CM

## 2010-09-25 LAB — POCT INR: INR: 4.2

## 2010-09-25 NOTE — Patient Instructions (Signed)
Hold today only. Then take 5mg  5mg  2.5mg . ALT

## 2010-10-26 ENCOUNTER — Ambulatory Visit: Payer: Medicare Other

## 2010-10-26 DIAGNOSIS — Z86718 Personal history of other venous thrombosis and embolism: Secondary | ICD-10-CM

## 2010-10-26 DIAGNOSIS — Z7901 Long term (current) use of anticoagulants: Secondary | ICD-10-CM

## 2010-10-26 LAB — POCT INR: INR: 1.9

## 2010-10-26 NOTE — Patient Instructions (Signed)
Same dose  Check in 4 weeks  

## 2010-11-30 ENCOUNTER — Ambulatory Visit: Payer: Medicare Other

## 2010-11-30 DIAGNOSIS — Z86718 Personal history of other venous thrombosis and embolism: Secondary | ICD-10-CM

## 2010-11-30 DIAGNOSIS — Z7901 Long term (current) use of anticoagulants: Secondary | ICD-10-CM

## 2010-11-30 LAB — POCT INR: INR: 2

## 2010-11-30 NOTE — Patient Instructions (Signed)
°  Latest dosing instructions  ° Total Sun Mon Tue Wed Thu Fri Sat  ° 30 5 mg 5 mg 2.5 mg 5 mg 5 mg 2.5 mg 5 mg  °  (5 mg×1) (5 mg×1) (5 mg×0.5) (5 mg×1) (5 mg×1) (5 mg×0.5) (5 mg×1)  °  °  ° ° °

## 2010-12-28 ENCOUNTER — Ambulatory Visit (INDEPENDENT_AMBULATORY_CARE_PROVIDER_SITE_OTHER): Payer: Medicare Other | Admitting: Internal Medicine

## 2010-12-28 DIAGNOSIS — Z86718 Personal history of other venous thrombosis and embolism: Secondary | ICD-10-CM

## 2010-12-28 LAB — POCT INR: INR: 1.9

## 2010-12-28 NOTE — Patient Instructions (Signed)
  Latest dosing instructions   Total Glynis Smiles Tue Wed Thu Fri Sat   30 5 mg 5 mg 2.5 mg 5 mg 5 mg 2.5 mg 5 mg    (5 mg1) (5 mg1) (5 mg0.5) (5 mg1) (5 mg1) (5 mg0.5) (5 mg1)

## 2010-12-31 ENCOUNTER — Inpatient Hospital Stay (HOSPITAL_COMMUNITY)
Admission: RE | Admit: 2010-12-31 | Discharge: 2011-01-15 | DRG: 453 | Disposition: A | Discharge status: Rehabilitation Facility | Payer: Medicare Other | Source: Other Acute Inpatient Hospital | Attending: Internal Medicine | Admitting: Internal Medicine

## 2010-12-31 ENCOUNTER — Encounter (HOSPITAL_BASED_OUTPATIENT_CLINIC_OR_DEPARTMENT_OTHER): Payer: Self-pay | Admitting: *Deleted

## 2010-12-31 ENCOUNTER — Emergency Department (HOSPITAL_BASED_OUTPATIENT_CLINIC_OR_DEPARTMENT_OTHER)
Admission: EM | Admit: 2010-12-31 | Discharge: 2010-12-31 | Disposition: A | Discharge status: Other Acute Inpatient Hospital | Payer: Medicare Other | Source: Home / Self Care | Attending: Emergency Medicine | Admitting: Emergency Medicine

## 2010-12-31 ENCOUNTER — Emergency Department (INDEPENDENT_AMBULATORY_CARE_PROVIDER_SITE_OTHER): Payer: Medicare Other

## 2010-12-31 DIAGNOSIS — K2900 Acute gastritis without bleeding: Secondary | ICD-10-CM

## 2010-12-31 DIAGNOSIS — M899 Disorder of bone, unspecified: Secondary | ICD-10-CM | POA: Insufficient documentation

## 2010-12-31 DIAGNOSIS — R5082 Postprocedural fever: Secondary | ICD-10-CM | POA: Diagnosis not present

## 2010-12-31 DIAGNOSIS — M5137 Other intervertebral disc degeneration, lumbosacral region: Secondary | ICD-10-CM

## 2010-12-31 DIAGNOSIS — D62 Acute posthemorrhagic anemia: Secondary | ICD-10-CM | POA: Diagnosis not present

## 2010-12-31 DIAGNOSIS — Z7901 Long term (current) use of anticoagulants: Secondary | ICD-10-CM

## 2010-12-31 DIAGNOSIS — R42 Dizziness and giddiness: Secondary | ICD-10-CM | POA: Diagnosis present

## 2010-12-31 DIAGNOSIS — D689 Coagulation defect, unspecified: Secondary | ICD-10-CM

## 2010-12-31 DIAGNOSIS — I2699 Other pulmonary embolism without acute cor pulmonale: Secondary | ICD-10-CM | POA: Diagnosis present

## 2010-12-31 DIAGNOSIS — Z86718 Personal history of other venous thrombosis and embolism: Secondary | ICD-10-CM

## 2010-12-31 DIAGNOSIS — C9000 Multiple myeloma not having achieved remission: Secondary | ICD-10-CM

## 2010-12-31 DIAGNOSIS — M8448XA Pathological fracture, other site, initial encounter for fracture: Principal | ICD-10-CM | POA: Diagnosis present

## 2010-12-31 DIAGNOSIS — Z79899 Other long term (current) drug therapy: Secondary | ICD-10-CM

## 2010-12-31 DIAGNOSIS — K263 Acute duodenal ulcer without hemorrhage or perforation: Secondary | ICD-10-CM

## 2010-12-31 DIAGNOSIS — Y92009 Unspecified place in unspecified non-institutional (private) residence as the place of occurrence of the external cause: Secondary | ICD-10-CM | POA: Insufficient documentation

## 2010-12-31 DIAGNOSIS — I951 Orthostatic hypotension: Secondary | ICD-10-CM | POA: Diagnosis not present

## 2010-12-31 DIAGNOSIS — Z01812 Encounter for preprocedural laboratory examination: Secondary | ICD-10-CM

## 2010-12-31 DIAGNOSIS — K5900 Constipation, unspecified: Secondary | ICD-10-CM | POA: Diagnosis present

## 2010-12-31 DIAGNOSIS — M549 Dorsalgia, unspecified: Secondary | ICD-10-CM

## 2010-12-31 DIAGNOSIS — S32009A Unspecified fracture of unspecified lumbar vertebra, initial encounter for closed fracture: Secondary | ICD-10-CM | POA: Insufficient documentation

## 2010-12-31 DIAGNOSIS — Z8719 Personal history of other diseases of the digestive system: Secondary | ICD-10-CM

## 2010-12-31 DIAGNOSIS — M9080 Osteopathy in diseases classified elsewhere, unspecified site: Secondary | ICD-10-CM | POA: Diagnosis present

## 2010-12-31 DIAGNOSIS — K219 Gastro-esophageal reflux disease without esophagitis: Secondary | ICD-10-CM

## 2010-12-31 DIAGNOSIS — IMO0002 Reserved for concepts with insufficient information to code with codable children: Secondary | ICD-10-CM

## 2010-12-31 DIAGNOSIS — K4090 Unilateral inguinal hernia, without obstruction or gangrene, not specified as recurrent: Secondary | ICD-10-CM

## 2010-12-31 DIAGNOSIS — M47817 Spondylosis without myelopathy or radiculopathy, lumbosacral region: Secondary | ICD-10-CM

## 2010-12-31 DIAGNOSIS — M949 Disorder of cartilage, unspecified: Secondary | ICD-10-CM | POA: Insufficient documentation

## 2010-12-31 DIAGNOSIS — Z8546 Personal history of malignant neoplasm of prostate: Secondary | ICD-10-CM

## 2010-12-31 DIAGNOSIS — K903 Pancreatic steatorrhea: Secondary | ICD-10-CM

## 2010-12-31 DIAGNOSIS — D630 Anemia in neoplastic disease: Secondary | ICD-10-CM | POA: Diagnosis present

## 2010-12-31 DIAGNOSIS — N320 Bladder-neck obstruction: Secondary | ICD-10-CM | POA: Diagnosis present

## 2010-12-31 DIAGNOSIS — J9600 Acute respiratory failure, unspecified whether with hypoxia or hypercapnia: Secondary | ICD-10-CM | POA: Diagnosis not present

## 2010-12-31 DIAGNOSIS — T8089XA Other complications following infusion, transfusion and therapeutic injection, initial encounter: Secondary | ICD-10-CM | POA: Diagnosis not present

## 2010-12-31 DIAGNOSIS — X58XXXA Exposure to other specified factors, initial encounter: Secondary | ICD-10-CM | POA: Insufficient documentation

## 2010-12-31 HISTORY — DX: Reserved for concepts with insufficient information to code with codable children: IMO0002

## 2010-12-31 LAB — URINALYSIS, ROUTINE W REFLEX MICROSCOPIC
Bilirubin Urine: NEGATIVE
Glucose, UA: NEGATIVE mg/dL
Ketones, ur: NEGATIVE mg/dL
Leukocytes, UA: NEGATIVE
Nitrite: NEGATIVE
Specific Gravity, Urine: 1.025 (ref 1.005–1.030)
pH: 7 (ref 5.0–8.0)

## 2010-12-31 LAB — CBC
MCH: 31.1 pg (ref 26.0–34.0)
MCHC: 34.6 g/dL (ref 30.0–36.0)
RDW: 13.7 % (ref 11.5–15.5)

## 2010-12-31 LAB — COMPREHENSIVE METABOLIC PANEL
ALT: 63 U/L — ABNORMAL HIGH (ref 0–53)
Albumin: 4.1 g/dL (ref 3.5–5.2)
Alkaline Phosphatase: 125 U/L — ABNORMAL HIGH (ref 39–117)
Calcium: 8.6 mg/dL (ref 8.4–10.5)
GFR calc Af Amer: >90 mL/min (ref >90)
Glucose, Bld: 98 mg/dL (ref 70–99)
Potassium: 4 mEq/L (ref 3.5–5.1)
Sodium: 138 mEq/L (ref 135–145)
Total Protein: 7.3 g/dL (ref 6.0–8.3)

## 2010-12-31 LAB — PROTIME-INR: INR: 1.85 — ABNORMAL HIGH (ref 0.00–1.49)

## 2010-12-31 LAB — URINE MICROSCOPIC-ADD ON

## 2010-12-31 LAB — APTT: aPTT: 32 seconds (ref 24–37)

## 2010-12-31 MED ORDER — LORAZEPAM 2 MG/ML IJ SOLN
0.5000 mg | Freq: Once | INTRAMUSCULAR | Status: AC
  Administered 2010-12-31: 0.5 mg via INTRAVENOUS
  Filled 2010-12-31: qty 1

## 2010-12-31 MED ORDER — HYDROMORPHONE HCL 1 MG/ML IJ SOLN
1.0000 mg | Freq: Once | INTRAMUSCULAR | Status: AC
  Administered 2010-12-31: 1 mg via INTRAVENOUS
  Filled 2010-12-31: qty 1

## 2010-12-31 MED ORDER — HYDROMORPHONE HCL 1 MG/ML IJ SOLN
1.0000 mg | Freq: Once | INTRAMUSCULAR | Status: AC
  Administered 2010-12-31: 1 mg via INTRAVENOUS

## 2010-12-31 MED ORDER — ONDANSETRON HCL 4 MG/2ML IJ SOLN
4.0000 mg | Freq: Once | INTRAMUSCULAR | Status: AC
  Administered 2010-12-31: 4 mg via INTRAMUSCULAR
  Filled 2010-12-31: qty 2

## 2010-12-31 MED ORDER — HYDROMORPHONE HCL 1 MG/ML IJ SOLN
INTRAMUSCULAR | Status: AC
  Filled 2010-12-31: qty 1

## 2010-12-31 MED ORDER — HYDROMORPHONE HCL 2 MG/ML IJ SOLN
2.0000 mg | Freq: Once | INTRAMUSCULAR | Status: AC
  Administered 2010-12-31: 2 mg via INTRAMUSCULAR
  Filled 2010-12-31: qty 1

## 2010-12-31 NOTE — ED Notes (Signed)
Pt removed from [facility] by radiology

## 2010-12-31 NOTE — ED Notes (Signed)
Pt family wife and daughter fighting in waiting room, security aware.  Pt wife and pt to request that phone in pt room to be disconnected.  Additionally, noted pt wife recording/documenting medication dose given to pt.

## 2010-12-31 NOTE — ED Notes (Signed)
I placed a call to the neurosugeon on call. Call was returned @722pm  by Dr. Franky Macho.

## 2010-12-31 NOTE — ED Notes (Signed)
Pt reports a sudden onset of back pain with rad to the left leg. Pt reports that this pain is more abnormal than his normal back pain with rad to the right side

## 2010-12-31 NOTE — ED Provider Notes (Signed)
History     CSN: [id number] Arrival date & time: 12/31/2010  3:26 PM   First MD Initiated Contact with Patient 12/31/10 1541      Chief Complaint  Patient presents with  . Back Pain    (Consider location/radiation/quality/duration/timing/severity/associated sxs/prior treatment) Patient is a 70 y.o. male presenting with back pain. The history is provided by the patient. No language interpreter was used.  Back Pain  This is a new problem. The current episode started 1 to 2 hours ago. The problem occurs constantly. The problem has been gradually worsening. The pain is associated with no known injury. The pain is present in the lumbar spine. The quality of the pain is described as stabbing and shooting. The pain is at a severity of 10/10. The pain is severe. The symptoms are aggravated by bending, twisting and certain positions. Pertinent negatives include no chest pain and no fever. He has tried analgesics for the symptoms. The treatment provided no relief.  Pt reports he walked up stairs today and began having severe pain in his back.  Pt reports he could not get up from bed.  (Daughter reports she believes pt fell earlier in the week) Pt has a history of prostate cancer.  Past Medical History  Diagnosis Date  . Pulmonary embolism   . Prostate cancer   . Pancreatitis   . Multiple myeloma   . Pulmonary embolism   . Degenerative disc disease     Past Surgical History  Procedure Date  . Cholecystectomy 04/06/2010    Family History  Problem Relation Age of Onset  . Lymphoma Father 80  . Cancer Father   . Clotting disorder Mother     blood clots    History  Substance Use Topics  . Smoking status: Former Games developer  . Smokeless tobacco: Not on file  . Alcohol Use: No      Review of Systems  Constitutional: Negative for fever.  Cardiovascular: Negative for chest pain.  Musculoskeletal: Positive for back pain.  All other systems reviewed and are negative.    Allergies    Review of patient's allergies indicates no known allergies.  Home Medications   Current Outpatient Rx  Name Route Sig Dispense Refill  . ACETAMINOPHEN 500 MG PO TABS Oral Take 500-1,000 mg by mouth every 6 (six) hours as needed. For pain     . NAPROXEN 500 MG PO TABS Oral Take 500 mg by mouth 2 (two) times daily with a meal.      . OMEPRAZOLE 20 MG PO CPDR Oral Take 20 mg by mouth daily as needed. For acid reflux    . OXYCODONE-ACETAMINOPHEN 5-325 MG PO TABS Oral Take 0.5 tablets by mouth every 4 (four) hours as needed. For pain      . WARFARIN SODIUM 5 MG PO TABS Oral Take 1 tablet (5 mg total) by mouth daily. 30 tablet 6    BP 133/83  Pulse 64  Temp(Src) 98.2 F (36.8 C) (Oral)  Resp 18  Ht 5\' 10"  (1.778 m)  Wt 212 lb (96.163 kg)  BMI 30.42 kg/m2  SpO2 98%  Physical Exam  Nursing note and vitals reviewed. Constitutional: He appears well-developed and well-nourished.  HENT:  Head: Normocephalic.  Eyes: Pupils are equal, round, and reactive to light.  Neck: Normal range of motion.  Pulmonary/Chest: Effort normal.  Abdominal: Soft.  Musculoskeletal: Normal range of motion.  Neurological: He is alert.  Skin: Skin is warm.  Psychiatric: He has a normal mood and  affect.    ED Course  Procedures (including critical care time)  Labs Reviewed - No data to display Dg Lumbar Spine Complete  12/31/2010  *RADIOLOGY REPORT*  Clinical Data: 70 year old male with sudden onset of back pain radiating to the left leg.  LUMBAR SPINE - COMPLETE 4+ VIEW  Comparison: Bone survey 04/01/2008.  Lumbar MRI 02/24/2008.  Findings: There is a fracture of the L3 vertebral body which is new.  This is somewhat vertically oriented and corresponds to the site of a chronic marrow lesion which had been previously evaluated by MRI and thought to be a hemangioma.  There is mild loss of vertebral body height on the order of 15%.  L3 fracture fragments are relatively nondisplaced.  No retropulsion is  evident.  The remaining lumbar levels appear stable.  There are surgical clips in the pelvis.  IMPRESSION: Pathologic fracture of the L3 vertebral body through the level of a chronic L3 lesion which was thought to be benign on the basis of a 2009 MRI. No significant retropulsion of bone.  Does the patient have a known malignancy?  Per CMS PQRS reporting requirements (PQRS Measure 24): Given the patient's age of greater than 50 and the fracture site (hip, distal radius, or spine), the patient should be tested for osteoporosis using DXA, and the appropriate treatment considered based on the DXA results.  Original Report Authenticated By: Harley Hallmark, M.D.     No diagnosis found.    MDM  I counseled pt on results,  Pt denies history of multiple myeloma. I spoke to Dr. Mikal Plane who advised pt needs bedrest and pain control.  He reports pt does not need Emergent neuro evaluation.  He advised hospitalist admission with consult. On reexamination with some pain control from dilaudid pt is moving lower extremities easily,  Pain worse with movement of back        [place], [place] 12/31/10 2049

## 2010-12-31 NOTE — ED Notes (Signed)
Pt requesting to remain on LBB and delay on xray until pain medication takes effect.

## 2011-01-01 ENCOUNTER — Inpatient Hospital Stay (HOSPITAL_COMMUNITY): Payer: Medicare Other

## 2011-01-01 ENCOUNTER — Telehealth: Payer: Self-pay | Admitting: *Deleted

## 2011-01-01 DIAGNOSIS — M8448XA Pathological fracture, other site, initial encounter for fracture: Secondary | ICD-10-CM

## 2011-01-01 LAB — COMPREHENSIVE METABOLIC PANEL
Albumin: 3.6 g/dL (ref 3.5–5.2)
Alkaline Phosphatase: 132 U/L — ABNORMAL HIGH (ref 39–117)
BUN: 12 mg/dL (ref 6–23)
Calcium: 8.9 mg/dL (ref 8.4–10.5)
Creatinine, Ser: 0.87 mg/dL (ref 0.50–1.35)
GFR calc Af Amer: >90 mL/min (ref >90)
Glucose, Bld: 105 mg/dL — ABNORMAL HIGH (ref 70–99)
Total Protein: 7 g/dL (ref 6.0–8.3)

## 2011-01-01 LAB — SAVE SMEAR

## 2011-01-01 LAB — CBC
HCT: 45.3 % (ref 39.0–52.0)
MCHC: 34.7 g/dL (ref 30.0–36.0)
MCV: 92.4 fL (ref 78.0–100.0)
Platelets: 236 10*3/uL (ref 150–400)
RDW: 13.8 % (ref 11.5–15.5)

## 2011-01-01 LAB — LACTATE DEHYDROGENASE: LDH: 200 U/L (ref 94–250)

## 2011-01-01 LAB — SEDIMENTATION RATE: Sed Rate: 5 mm/hr (ref 0–16)

## 2011-01-01 LAB — HEPARIN LEVEL (UNFRACTIONATED): Heparin Unfractionated: <0.1 IU/mL — ABNORMAL LOW (ref 0.30–0.70)

## 2011-01-01 NOTE — Telephone Encounter (Signed)
To er

## 2011-01-01 NOTE — Telephone Encounter (Signed)
[facility] Triage Call Report Triage Record Num: [id number] Operator: Estevan Oaks Patient Name: Rodney [name] Call Date & Time: 12/31/2010 1:49:33PM Patient Phone: [phone] PCP: Darryll Capers Patient Gender: Male PCP Fax : [phone] Patient DOB: May 31, 1940 Practice Name: [facility] Reason for Call: [name] he is unable to get up from bed due to severe lower back pain. He has not been able to get up to bathroom nor to eat or drink anything today. Rn adv'd to have adult drive to ED. He sts he cannot get to car. Rn adv'd to call 911. He agrees. Protocol(s) Used: Office Note Recommended Outcome per Protocol: Information Noted and Sent to Office Reason for Outcome: Caller information to office Care Advice: ~ 12/31/2010 1:53:12PM Page 1 of 1 [email or web address]

## 2011-01-01 NOTE — ED Provider Notes (Signed)
Medical screening examination/treatment/procedure(s) were performed by non-physician practitioner and as supervising physician I was immediately available for consultation/collaboration.  Brihany Butch K Linker, MD 01/01/11 0706 

## 2011-01-01 NOTE — H&P (Signed)
Rodney [name], Rodney [name]            ACCOUNT NO.:  [id number]  MEDICAL RECORD NO.:  [id number]  LOCATION:  [facility]07                          FACILITY:  [facility]  PHYSICIAN:  Osvaldo Shipper, MD     DATE OF BIRTH:  1940-03-27  DATE OF ADMISSION:  12/31/2010 DATE OF DISCHARGE:  12/31/2010                             HISTORY & PHYSICAL   PRIMARY CARE PHYSICIAN:  Stacie Glaze, MD with [facility].  ONCOLOGIST:  Samul Dada, MD.  ADMISSION DIAGNOSES: 1. Acute low back pain secondary to pathologic fracture. 2. Pathologic fracture of L3 with moderate-to-severe spinal stenosis. 3. Suspected multiple myeloma lytic lesions in the spine as well as in     the sacrum. 4. History of pulmonary embolism on lifelong anticoagulation. 5. History of prostate cancer, status post prostatectomy in 2003.  CHIEF COMPLAINT:  Severe back pain.  HISTORY OF PRESENT ILLNESS:  The patient is a 70 year old Caucasian male with a past medical history of pulmonary embolism and other issues as mentioned above, who was in his usual state of health until earlier today when he went to his daughter's house.  He wanted to pick up the grandchildren.  They were not ready and he was having some back pain in his lower back, so he went up to the daughter's bedroom to lie down on the bed.  When he woke up and got up, he stood on the floor and then he kind of slid down, could not stay standing up.  He had severe back pain, the daughter could not help him up either.  EMS was called and the patient was sent to [facility] Emergency Department.  The pain was 10/10 in intensity, he received Dilaudid and it came down to 4, currently it is about 7/10 in intensity.  He has been having the lingering back pain for the past few weeks now according to his wife. Usually he has right-sided pain which radiates down the right leg, but today it was the left-sided pain and did not really radiate anywhere. The pain is aggravated by  movement.  It is relieved partially by laying still.  No real precipitant factors identified.  He is able to maintain control over his bladder and his bowels.  MEDICATIONS AT HOME:  According to his wife, he is on warfarin, unknown dose.  He is on Prilosec, Naprosyn, Percocet, and Tylenol.  ALLERGIES:  No known drug allergies.  PAST MEDICAL HISTORY:  Positive for history of pulmonary embolism, currently on lifelong Coumadin.  He has had 2 episodes, one in 2003, and then one in 2012.  History of prostate cancer, status post prostatectomy.  Then he tells me that he has been evaluated by his oncologist for multiple myeloma.  It is unclear as to what sort of evaluation was done.  Reviewing his previous imaging studies, it looks like he had a bone scan in 2006, which showed sclerotic lesions, but nothing lytic.  He had bone survey mets in January 2010, which did not show any lesions suggestive of multiple myeloma.  It remains unclear as to how this diagnosis was pursued.  It is unclear if this was primarily because of bony lesions or other metabolic  issues.  Denies any other surgical procedures apart from a gallbladder surgery in March of this year and the prostate surgery in 2003.  SOCIAL HISTORY:  He lives in [place] with his wife.  He works Radio producer.  Denies smoking, alcohol, or illicit drug use.  FAMILY HISTORY:  Unremarkable.  REVIEW OF SYSTEMS:  The patient is currently partially sedated because of being on narcotics and so this could not be done.  PHYSICAL EXAMINATION:  VITAL SIGNS:  Temperature is 99.1, blood pressure is 110/76, heart rate is 76, respiratory rate 16, saturation 93% on 2.5 L by nasal cannula. GENERAL:  This is a slightly overweight white male in no distress. HEENT:  Head is normocephalic, atraumatic.  Pupils are equal, reacting. No pallor.  No icterus.  Oral mucous membrane is moist.  No oral lesions are noted. NECK:  Soft and supple.  No  thyromegaly is appreciated. LUNGS:  Clear to auscultation bilaterally with no wheezing, rales, or rhonchi. LYMPHATICS:  No cervical, supraclavicular, inguinal lymphadenopathy is present. ABDOMEN:  Soft, nontender, nondistended.  Bowel sounds are present.  No masses or organomegaly is appreciated. CARDIOVASCULAR:  S1, S2 is normal, regular.  No S3, S4, rubs, murmurs, or bruits. GU:  Deferred. MUSCULOSKELETAL:  He is able to move his feet and toe.  He is able to move his legs and bend his knees, but I did not do the straight leg raising because of the obvious fracture at this time. NEUROLOGICAL:  He is somnolent, arousable, goes back to sleep.  Does not have any focal neurological deficits.  LABORATORY DATA:  His UA showed cloudy urine with negative nitrite, negative leukocytes, many bacteria.  His white cell count is 12.2, hemoglobin is 15.8, platelet count is 245.  INR is 1.85.  Electrolytes are normal.  He does have alk phos which is 125, AST is 88, ALT is 63.  IMAGING STUDIES: 1. He had a CT of his lumbar spine which showed a pathological     fracture of L3 with lytic lesions in the spine and in the sacrum. 2. He had lumbar spine films which again showed the same fracture.  ASSESSMENT:  This is a 70 year old Caucasian male with history of pulmonary embolisms, who was on warfarin with subtherapeutic INR today, who presents with back pain and is found to have pathological fractures of L3 with lytic lesions suggestive of multiple myeloma.  He also has transaminitis, the etiology of which is not clear.  PLAN: 1. Pathological fracture of L3.  Dr. Franky Macho will evaluate this     patient in the morning.  He discussed this with the ED physician     and felt that there was no urgent need for surgery at this time.     Pain control will be provided. 2. Lytic lesions suggestive of myeloma.  We will proceed with SPEP.     We will inform Oncology in the morning. 3. Transaminitis, etiology  remains unclear.  We will repeat his LFTs,     check a hepatitis panel. 4. Leukocytosis.  His UA is mildly abnormal, but does not suggest     infection.  We will repeat another UA on him. 5. History of PE with subtherapeutic INR.  It is not clear if there     will be any role for surgery in this case or not.  Until that is     clarified, we will put the patient on IV heparin and hold his     Coumadin.  Once surgical plan is outlined, Coumadin can be     initiated as appropriate. 6. DVT prophylaxis.  He will be on a heparin drip.  We will give him     stress ulcer prophylaxis.  He is a full code.  Further management decisions will depend on results of further testing and the patient's response to treatment.  Osvaldo Shipper, MD     GK/MEDQ  D:  01/01/2011  T:  01/01/2011  Job:  [id number]  cc:   Stacie Glaze, MD Samul Dada, M.D. Coletta Memos, M.D.  Electronically Signed by Osvaldo Shipper MD on 01/01/2011 06:56:06 PM

## 2011-01-02 ENCOUNTER — Other Ambulatory Visit (HOSPITAL_COMMUNITY): Payer: Medicare Other

## 2011-01-02 ENCOUNTER — Inpatient Hospital Stay (HOSPITAL_COMMUNITY): Payer: Medicare Other

## 2011-01-02 LAB — COMPREHENSIVE METABOLIC PANEL
ALT: 56 U/L — ABNORMAL HIGH (ref 0–53)
AST: 34 U/L (ref 0–37)
CO2: 28 mEq/L (ref 19–32)
Chloride: 102 mEq/L (ref 96–112)
Creatinine, Ser: 0.91 mg/dL (ref 0.50–1.35)
GFR calc non Af Amer: 85 mL/min — ABNORMAL LOW (ref >90)
Glucose, Bld: 109 mg/dL — ABNORMAL HIGH (ref 70–99)
Total Bilirubin: 0.7 mg/dL (ref 0.3–1.2)

## 2011-01-02 LAB — CREATININE CLEARANCE, URINE, 24 HOUR
Creatinine Clearance: 124 mL/min (ref 75–125)
Creatinine, 24H Ur: 1619 mg/d (ref 800–2000)
Creatinine: 0.91 mg/dL (ref 0.50–1.35)
Urine Total Volume-CRCL: 3950 mL

## 2011-01-02 LAB — CBC
HCT: 43 % (ref 39.0–52.0)
MCH: 31.4 pg (ref 26.0–34.0)
MCV: 91.9 fL (ref 78.0–100.0)
RDW: 13.6 % (ref 11.5–15.5)
WBC: 11.7 10*3/uL — ABNORMAL HIGH (ref 4.0–10.5)

## 2011-01-02 LAB — KAPPA/LAMBDA LIGHT CHAINS
Kappa free light chain: 1.44 mg/dL (ref 0.33–1.94)
Kappa, lambda light chain ratio: 0.04 — ABNORMAL LOW (ref 0.26–1.65)
Lambda free light chains: 34.7 mg/dL — ABNORMAL HIGH (ref 0.57–2.63)

## 2011-01-02 LAB — PROTIME-INR: INR: 1.8 — ABNORMAL HIGH (ref 0.00–1.49)

## 2011-01-02 LAB — PROTEIN, URINE, 24 HOUR
Collection Interval-UPROT: 24 hours
Urine Total Volume-UPROT: 3950 mL

## 2011-01-02 LAB — HEPATITIS PANEL, ACUTE: HCV Ab: NEGATIVE

## 2011-01-02 LAB — BETA 2 MICROGLOBULIN, SERUM: Beta-2 Microglobulin: 1.34 mg/L (ref 1.01–1.73)

## 2011-01-02 MED ORDER — GADOBENATE DIMEGLUMINE 529 MG/ML IV SOLN
20.0000 mL | Freq: Once | INTRAVENOUS | Status: AC
  Administered 2011-01-02: 20 mL via INTRAVENOUS

## 2011-01-02 NOTE — Consult Note (Signed)
NAMEAUNDREY, ELAHI            ACCOUNT NO.:  [id number]  MEDICAL RECORD NO.:  [id number]  LOCATION:                                 FACILITY:  PHYSICIAN:  Coletta Memos, M.D.     DATE OF BIRTH:  03/05/1941  DATE OF CONSULTATION: DATE OF DISCHARGE:                                CONSULTATION   CHIEF COMPLAINT:  Pathologic fracture L3, lytic lesion L3, and sacrum.  INDICATIONS:  [name] is a 70 year old gentleman who presented to the [facility] with severe pain in his lower back.  As he was able to relate to me, he did not take a fall nor did he have any trauma, he was simply walking and simply doubled over with pain and was unable to walk or stand at that point.  He was at his daughter's home when this occurred.  [facility].  He reported his pain in the lumbar spine as being 10/10.  He received Dilaudid for pain control.  He underwent a CT and plain films of the lumbar spine and what it revealed was a fracture of L3 and a lytic lesion in the sacrum.  These lesions had been previously identified and at the very least 2006.  It was suspected that they might represent hemangioma or possible myeloma.  He had undergone surgeon study since that time and had had an increase in Bence-Jones, had a monoclonal gammopathy.  He had bone scans and alike, but a definitive diagnosis was never rendered.  The lesion was felt to be very slow growing if indeed it was a myeloma, has remained essentially the same size for a number of years.  He has actually no neurologic deficits.  He has maintained bowel and bladder control, both pre and post hospitalization.  He was having absolutely no difficulties prior to the incident yesterday, which led to his admission to [facility].  PAST MEDICAL HISTORY:  Includes history of pulmonary emboli, which occurred on 2 occasions.  He had a history of prostate cancer and underwent a prostatectomy  in the year 2003.  He has a history of an appendectomy also.  He does not smoke cigarettes.  He does not use alcohol.  He does not have a history of IV drug abuse.  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  He is currently married, has a daughter, and lives with his wife.  FAMILY HISTORY:  Significant for peptic ulcer disease and for pulmonary emboli in his mother, grandparents, and uncle.  He has had 2 pancreatitis.  He has had history of UTIs in the past.  Since admission, his pain is being controlled with IV medication and he is at bedrest.  At home, he was taking warfarin, Prilosec, Naprosyn, Percocet, and Tylenol.  He has no known drug allergies.  Current medications include heparin infusion, Dilaudid for pain, methocarbamol, pantoprazole, MiraLax, acetaminophen, albuterol, Zofran p.r.n., and Senokot.  PHYSICAL EXAMINATION:  VITAL SIGNS:  On examination, temperature is 98, pulse 76, respiratory rate is 19, blood pressure 132/75. GENERAL:  He is alert and oriented x4 and answers all questions appropriately. NEUROLOGIC:  Memory, language, attention span, and fund of knowledge are normal.  Pupils equal, round, and reactive to light.  Full extraocular movements.  Full visual fields.  Hearing intact to voice bilaterally. Uvula elevates in the midline.  Shoulder shrug is normal.  Tongue protrudes in the midline. LUNGS:  Clear. HEART:  Regular rhythm and rate.  No murmurs or rubs.  Pulses good at the wrists bilaterally. ABDOMEN:  Soft, nontender, significant tenderness in and around the lumbar region.  He has no clubbing, cyanosis, or edema. EYES:  Head is normocephalic and atraumatic.  Sclerae not injected. MUSCULOSKELETAL:  5/5 strength in the lower extremities.  Intact proprioception in the upper and lower extremities.  No clonus.  No Hoffman sign.  Toes are downgoing to plantar stimulation.  Normal muscle tone, bulk, and coordination.  Gait not  assessed.  CT of the lumbar spine is reviewed and shows a lytic mass at L3 which is enlarged significantly from the same mass seen on CT in 2006, performed at [facility].  The only remnants of this show some remnants of bone within, there is a classic compression configuration in terms of the fracture of L3.  He has a large sacral lesion which is in the left sacral ala.  Lesion in the sacrum measures 5.4 x 4.0 cm.  Secondary to the quality of the CT, it is not actually possible to determine how much stenosis there is or is not within the spinal canal.  Radiologist with this time, feel that the lesions are consistent with a myeloma. Alignment is otherwise normal.  ASSESSMENT:  Rodney [name] has sustained a pathologic fracture of L3 which is the cause of his low back pain.  He was neurologically intact. There are 2 surgical options which will depend on how much of the tumor mass is within the spinal canal.  I anticipate that he will need to undergo corpectomy of L3 for subsequent stabilization done from both anterolateral approach and a posterior approach.  I have explained this to Mr. Demore and his family.  I have explained that the MRI is essential for surgical planning in this case.  I have explained that if he does have to undergo the corpectomy and stabilization that he would be in the hospital for some time about a week, not withstanding what we need to be done with regards to treatment of the definitive disease.  I have explained that the spine at this time is unstable.  He has remarked that he might seek a second opinion which is perfectly understandable. I simply asked him to let me know whom it is that he would like to see, so we can proceed.  I would like to have the procedure done by this week so we can stabilize the spine and get him up.  He is on a heparin currently and he is to remain on heparin.  He will need the very least a 48-hour without heparin and this is  essential for the integrity of his spinal canal, the risk of postop hematoma.  It would also be beneficial if indeed the heparin will be restarted, that to be done without bolusing him.  I will return to evaluate the MRI and explain those findings to the patient.  I showed Mr. Churilla and his family, the CT from yesterday and the CT from 2003.  He understands and is ready to proceed because as he states he is going so crazy just lying in the bed.  ______________________________ Coletta Memos, M.D.     KC/MEDQ  D:  01/01/2011  T:  01/02/2011  Job:  [id number]  Electronically Signed by Coletta Memos M.D. on 01/02/2011 02:55:30 PM

## 2011-01-03 ENCOUNTER — Inpatient Hospital Stay (HOSPITAL_COMMUNITY): Payer: Medicare Other

## 2011-01-03 DIAGNOSIS — I80299 Phlebitis and thrombophlebitis of other deep vessels of unspecified lower extremity: Secondary | ICD-10-CM

## 2011-01-03 LAB — CBC
Hemoglobin: 14.8 g/dL (ref 13.0–17.0)
Platelets: 220 10*3/uL (ref 150–400)
RBC: 4.74 MIL/uL (ref 4.22–5.81)
WBC: 10.8 10*3/uL — ABNORMAL HIGH (ref 4.0–10.5)

## 2011-01-03 LAB — PROTEIN ELECTROPH W RFLX QUANT IMMUNOGLOBULINS
Albumin ELP: 54.1 % — ABNORMAL LOW (ref 55.8–66.1)
Albumin ELP: 54.8 % — ABNORMAL LOW (ref 55.8–66.1)
Alpha-1-Globulin: 4.6 % (ref 2.9–4.9)
Alpha-2-Globulin: 12.1 % — ABNORMAL HIGH (ref 7.1–11.8)
Alpha-2-Globulin: 11.9 % — ABNORMAL HIGH (ref 7.1–11.8)
Beta Globulin: 6.1 % (ref 4.7–7.2)
Gamma Globulin: 15.3 % (ref 11.1–18.8)
M-Spike, %: NOT DETECTED g/dL
Total Protein ELP: 6.6 g/dL (ref 6.0–8.3)
Total Protein ELP: 6.8 g/dL (ref 6.0–8.3)

## 2011-01-03 LAB — HEPARIN LEVEL (UNFRACTIONATED): Heparin Unfractionated: 0.42 IU/mL (ref 0.30–0.70)

## 2011-01-04 LAB — CBC
HCT: 44.7 % (ref 39.0–52.0)
MCHC: 33.6 g/dL (ref 30.0–36.0)
MCV: 91.8 fL (ref 78.0–100.0)
Platelets: 249 10*3/uL (ref 150–400)
RDW: 13.3 % (ref 11.5–15.5)
WBC: 10 10*3/uL (ref 4.0–10.5)

## 2011-01-04 LAB — COMPREHENSIVE METABOLIC PANEL
Albumin: 3.2 g/dL — ABNORMAL LOW (ref 3.5–5.2)
BUN: 8 mg/dL (ref 6–23)
Calcium: 9.1 mg/dL (ref 8.4–10.5)
GFR calc Af Amer: >90 mL/min (ref >90)
Glucose, Bld: 101 mg/dL — ABNORMAL HIGH (ref 70–99)
Total Protein: 6.7 g/dL (ref 6.0–8.3)

## 2011-01-04 LAB — IMMUNOFIXATION ELECTROPHORESIS
IgA: 381 mg/dL — ABNORMAL HIGH (ref 68–379)
IgG (Immunoglobin G), Serum: 987 mg/dL (ref 650–1600)
Total Protein ELP: 6.7 g/dL (ref 6.0–8.3)

## 2011-01-04 LAB — IMMUNOFIXATION, URINE

## 2011-01-05 ENCOUNTER — Other Ambulatory Visit: Payer: Self-pay | Admitting: Neurosurgery

## 2011-01-05 ENCOUNTER — Inpatient Hospital Stay (HOSPITAL_COMMUNITY): Payer: Medicare Other

## 2011-01-05 ENCOUNTER — Other Ambulatory Visit: Payer: Self-pay

## 2011-01-05 DIAGNOSIS — J9600 Acute respiratory failure, unspecified whether with hypoxia or hypercapnia: Secondary | ICD-10-CM

## 2011-01-05 DIAGNOSIS — I2699 Other pulmonary embolism without acute cor pulmonale: Secondary | ICD-10-CM

## 2011-01-05 DIAGNOSIS — Z9911 Dependence on respirator [ventilator] status: Secondary | ICD-10-CM

## 2011-01-05 LAB — CBC
HCT: 44.2 % (ref 39.0–52.0)
HCT: 30.2 % — ABNORMAL LOW (ref 39.0–52.0)
Hemoglobin: 14.9 g/dL (ref 13.0–17.0)
Platelets: 174 10*3/uL (ref 150–400)
RBC: 4.84 MIL/uL (ref 4.22–5.81)
RDW: 15.2 % (ref 11.5–15.5)
WBC: 10 10*3/uL (ref 4.0–10.5)
WBC: 11.5 10*3/uL — ABNORMAL HIGH (ref 4.0–10.5)

## 2011-01-05 LAB — BLOOD GAS, ARTERIAL
MECHVT: 500 mL
TCO2: 23 mmol/L (ref 0–100)
pCO2 arterial: 36.1 mmHg (ref 35.0–45.0)
pH, Arterial: 7.4 (ref 7.350–7.450)

## 2011-01-05 LAB — SURGICAL PCR SCREEN
MRSA, PCR: NEGATIVE
Staphylococcus aureus: NEGATIVE

## 2011-01-05 LAB — PROTIME-INR: Prothrombin Time: 14.1 seconds (ref 11.6–15.2)

## 2011-01-05 LAB — HEPARIN LEVEL (UNFRACTIONATED): Heparin Unfractionated: 0.17 IU/mL — ABNORMAL LOW (ref 0.30–0.70)

## 2011-01-05 LAB — ABO/RH: ABO/RH(D): B POS

## 2011-01-05 MED ORDER — HYDROMORPHONE 0.3 MG/ML IV SOLN: INTRAVENOUS | Status: DC

## 2011-01-05 MED ORDER — DEXTROSE-NACL 5-0.45 % IV SOLN: INTRAVENOUS | Status: DC

## 2011-01-05 MED ORDER — CHLORHEXIDINE GLUCONATE 0.12 % MT SOLN
15.0000 mL | Freq: Two times a day (BID) | OROMUCOSAL | Status: DC
Start: 2011-01-05 — End: 2011-01-15
  Administered 2011-01-07 – 2011-01-15 (×14): 15 mL via OROMUCOSAL
  Filled 2011-01-05 (×18): qty 15

## 2011-01-05 MED ORDER — METHOCARBAMOL 500 MG PO TABS
500.0000 mg | ORAL_TABLET | Freq: Four times a day (QID) | ORAL | Status: DC
  Filled 2011-01-05 (×11): qty 1

## 2011-01-05 MED ORDER — SODIUM CHLORIDE 0.9 % IV SOLN
50.0000 ug/h | INTRAVENOUS | Status: DC
  Filled 2011-01-05: qty 50

## 2011-01-05 MED ORDER — ONDANSETRON HCL 4 MG/2ML IJ SOLN: 4.0000 mg | INTRAMUSCULAR | Status: DC | PRN

## 2011-01-05 MED ORDER — POTASSIUM CHLORIDE 2 MEQ/ML IV SOLN
INTRAVENOUS | Status: DC
  Administered 2011-01-07 – 2011-01-10 (×4): via INTRAVENOUS
  Filled 2011-01-05 (×13): qty 1000

## 2011-01-05 MED ORDER — DIPHENHYDRAMINE HCL 12.5 MG/5ML PO ELIX
12.5000 mg | ORAL_SOLUTION | Freq: Three times a day (TID) | ORAL | Status: DC | PRN
  Filled 2011-01-05: qty 5

## 2011-01-05 MED ORDER — BISACODYL 10 MG RE SUPP: 10.0000 mg | Freq: Every day | RECTAL | Status: DC | PRN

## 2011-01-05 MED ORDER — POLYETHYLENE GLYCOL 3350 17 G PO PACK
17.0000 g | PACK | Freq: Every day | ORAL | Status: DC
  Administered 2011-01-07 – 2011-01-14 (×7): 17 g via ORAL
  Filled 2011-01-05 (×13): qty 1

## 2011-01-05 MED ORDER — BIOTENE DRY MOUTH MT LIQD: 15.0000 mL | Freq: Two times a day (BID) | OROMUCOSAL | Status: DC

## 2011-01-05 MED ORDER — PROPOFOL 10 MG/ML IV EMUL: 5.0000 ug/kg/min | INTRAVENOUS | Status: DC

## 2011-01-05 MED ORDER — NALOXONE HCL 0.4 MG/ML IJ SOLN: 0.4000 mg | INTRAMUSCULAR | Status: DC | PRN

## 2011-01-05 MED ORDER — SENNA 8.6 MG PO TABS
2.0000 | ORAL_TABLET | Freq: Every day | ORAL | Status: DC | PRN
  Filled 2011-01-05: qty 2

## 2011-01-05 MED ORDER — PROMETHAZINE HCL 25 MG/ML IJ SOLN
6.2500 mg | Freq: Four times a day (QID) | INTRAMUSCULAR | Status: DC | PRN
  Administered 2011-01-10: 6.25 mg via INTRAVENOUS
  Filled 2011-01-05: qty 1

## 2011-01-05 MED ORDER — OXYCODONE-ACETAMINOPHEN 5-325 MG PO TABS: 1.0000 | ORAL_TABLET | ORAL | Status: DC | PRN

## 2011-01-05 MED ORDER — SODIUM CHLORIDE 0.9 % IJ SOLN: 9.0000 mL | INTRAMUSCULAR | Status: DC | PRN

## 2011-01-05 MED ORDER — DOCUSATE SODIUM 100 MG PO CAPS
100.0000 mg | ORAL_CAPSULE | Freq: Two times a day (BID) | ORAL | Status: DC
  Filled 2011-01-05 (×5): qty 1

## 2011-01-05 MED ORDER — PHENYLEPHRINE HCL 10 MG/ML IJ SOLN
30.0000 ug/min | INTRAVENOUS | Status: DC
  Filled 2011-01-05: qty 1

## 2011-01-05 MED ORDER — CALCIUM CARBONATE ANTACID 500 MG PO CHEW
2.0000 | CHEWABLE_TABLET | ORAL | Status: DC | PRN
  Filled 2011-01-05 (×2): qty 2

## 2011-01-05 MED ORDER — FENTANYL BOLUS VIA INFUSION
50.0000 ug | Freq: Four times a day (QID) | INTRAVENOUS | Status: DC | PRN
  Filled 2011-01-05: qty 100

## 2011-01-05 MED ORDER — ALBUTEROL SULFATE (5 MG/ML) 0.5% IN NEBU
2.5000 mg | INHALATION_SOLUTION | Freq: Four times a day (QID) | RESPIRATORY_TRACT | Status: DC | PRN
  Filled 2011-01-05: qty 0.5

## 2011-01-05 MED ORDER — ACETAMINOPHEN 325 MG PO TABS: 325.0000 mg | ORAL_TABLET | ORAL | Status: DC | PRN

## 2011-01-05 MED ORDER — HYDROCODONE-ACETAMINOPHEN 5-325 MG PO TABS: 1.0000 | ORAL_TABLET | ORAL | Status: DC | PRN

## 2011-01-05 MED ORDER — PANTOPRAZOLE SODIUM 40 MG PO TBEC: 40.0000 mg | DELAYED_RELEASE_TABLET | Freq: Two times a day (BID) | ORAL | Status: DC

## 2011-01-05 MED ORDER — HYDROMORPHONE HCL PF 2 MG/ML IJ SOLN: 2.0000 mg | INTRAMUSCULAR | Status: DC | PRN

## 2011-01-05 MED ORDER — DIAZEPAM 5 MG PO TABS: 5.0000 mg | ORAL_TABLET | Freq: Four times a day (QID) | ORAL | Status: DC | PRN

## 2011-01-05 MED ORDER — ONDANSETRON HCL 4 MG/2ML IJ SOLN: 4.0000 mg | Freq: Four times a day (QID) | INTRAMUSCULAR | Status: DC | PRN

## 2011-01-05 MED ORDER — DIPHENHYDRAMINE HCL 50 MG/ML IJ SOLN
12.5000 mg | Freq: Three times a day (TID) | INTRAMUSCULAR | Status: DC | PRN
Start: 2011-01-05 — End: 2011-01-06

## 2011-01-05 MED ORDER — ACETAMINOPHEN 650 MG RE SUPP: 650.0000 mg | RECTAL | Status: DC | PRN

## 2011-01-05 NOTE — Consult Note (Signed)
Rodney [name], HARDCASTLE            ACCOUNT NO.:  [id number]  MEDICAL RECORD NO.:  [id number]  LOCATION:  [facility]07                          FACILITY:  [facility]  PHYSICIAN:  Samul Dada, M.D.DATE OF BIRTH:  1941-02-01  DATE OF CONSULTATION:  01/01/2011 DATE OF DISCHARGE:  12/31/2010                                CONSULTATION   REASON FOR CONSULT:  Multiple myeloma.  CONSULTING PHYSICIAN:  Dr. Arline Asp.  PRIMARY CARE PHYSICIAN:  Dr. Lovell Sheehan.  HISTORY OF PRESENT ILLNESS:  Mr. Melody is a 70 year old white male initially seen as an outpatient on January 2010, for multiple myeloma workup, as labs at Dr. Lovell Sheehan' office, had shown free monoclonal lambda light chains in urine.  At that time, he also had lesions on the L3 and left sacrum, with concerns about multiple myeloma.  The immediate studies from February 24, 2008, were compared with those from November 30, 2004, and it was felt by the radiologist that those lesions had not changed significantly, were most consistent with atypical hemangiomas. As for his pertinent labs, his beta-2 microglobulin was normal at 1.58, his serum immunofixation was negative for monoclonal protein, his kappa lambda ratio was 0.17, slightly low, his kappa free light chain in serum was normal.  His lambda free light chain was elevated, with IgG at 1170 (normal), IgA 432 (slightly elevated) and IgM normal at 141.  He also was evaluated for hypercoagulability due to his past history of PE without DVT, with low suspicion for hypercoagulable state, with only 2 of normal being cardiolipin IgA of 19 (being less than 13 normal), and beta-2 glycoprotein 1, IgA slightly elevated at 10, with the normal being less than 10).  Stated in Dr. Mamie Levers note of April 01, 2008, the patient required ongoing followup to include imaging and protein studies, but he was lost to followup despite those recommendations.  He was admitted on December 31, 2010, with exquisite  back pain, with a CT of the L-spine without contrast on December 31, 2010, showing a pathologic L3 vertebral body, replaced by lytic process and fracture.  There is moderate spinal stenosis as a result.  In addition, a large lytic sacral region consisting with multiple myeloma was seen on the left, measuring 5.4 x 4.0 cm.  Based on these findings, we were asked to see him in consultation.  PAST MEDICAL HISTORY: 1. History of prostate cancer, Gleason 6 (3+ 3), [id number] diagnosed on     June 03, 2010. 2. History of PE on lifelong Coumadin, since the year 2003. 3. History of Wechenbach/ Mobitz I  dysrhythmia diagnosed in April     2003. 4. History of known left tenth rib sclerotic lesion per bone scan. 5. Anxiety. 6. History of elevated TSH on October 2006. 7. History of idiopathic pancreatitis October 2006. 8. GERD. 9. Ejection fraction of 55-60% per two-D echo on Jul 25, 2010, with     normal LV function. 10.DJD.  SURGERY: 1. Status post radical prostatectomy on June 02, 2001, Dr. Brunilda Payor. 2. Status post cholecystectomy May 23, 2010. 3. Status post appendectomy, remote.  ALLERGIES:  NKDA.  MEDICATIONS:  Heparin, Dilaudid, Robaxin, Protonix, MiraLax, Tylenol, Ventolin, Dulcolax, Zofran, Phenergan, senna.  REVIEW OF SYSTEMS:  Negative for fever, chills, night sweats, headaches, mental status changes, vision changes, dysphagia, dyspnea on exertion, shortness of breath, productive cough, chest pain or palpitations. Negative for abdominal pain, failure to thrive, appetite changes, weight loss, or fatigue.  No GERD symptoms.  He denies any nausea, vomiting, diarrhea, or constipation, nor blood in the stools.  No blood in the urine.  No gum bleed or nose bleeds.  No hemoptysis.  Main concern is back pain, 10/10 on admission, better with pain medications, onset being around 2 p.m. on Sunday prior to admission, with no injury.  He denies any neuropathy or motor deficits.  The rest of  the review of systems is negative.  FAMILY HISTORY:  Mother died at 56 with blood clots, father died with prostate cancer and lymphoma at 92.  He has 3 brothers in good health, and a history of both grandparents, 1 uncle, dying with PE.  SOCIAL HISTORY:  The patient is married, 3 children, all diabetics. Lives in [place]. He sells carpets.  He was working until 2 days prior to admission.  He drives about 1000 miles a week due  the nature of his job.  He smoked until 25 years ago.  No alcohol  or recreational drug use.   PHYSICAL EXAMINATION:  GENERAL:  This is a moderately obese 70 year old white male in no acute distress.  Alert and oriented x3. VITAL SIGNS:  Blood pressure 111/71, pulse 76, respirations 18, temperature 97.6, O2 sats 92% in 3 L, weight 97.1 kg, height 68 inches. HEENT:  Normocephalic, atraumatic.  PERRLA.  Oral cavity without thrush or lesions.  NECK:  Supple.  No cervical or supraclavicular masses. LUNGS:  Clear to auscultation bilaterally.  No axillary masses. CARDIOVASCULAR:  Regular rate and rhythm without murmurs, rubs, or gallops.  ABDOMEN:  Moderately obese, nontender.  Bowel sounds x4.  No hepatosplenomegaly. GU/RECTAL:  Deferred. EXTREMITIES:  No clubbing or cyanosis.  No edema.  No inguinal masses. SKIN:  Without lesions, bruising or petechial rash. NEURO:  Essentially nonfocal.  LABORATORY DATA:  Hemoglobin 15.7, hematocrit 45.3, white count 11.1, platelets 236, MCV 92.4.  PTT 32.  PT 21.7.  INR 1.85.  Sodium 137, potassium 4.2, BUN 12, creatinine 0.87, glucose 105.  Total bilirubin 0.8, alkaline phosphatase 132, AST 59, ALT 81, total protein 7.0, albumin 3.6, calcium 8.9.  IgA 385, IgG 1140, IgM normal at 106.  Sed rate 5.  LDH 200.  Uric acid 3.4.  At this time, beta-2 microglobulin, kappa lambda light chains, serum and urine immunofixations are pending.  Hepatitis panel is pending.  Metastatic bone survey on January 01, 2011, confirms a  pathologic fracture at L3, with no other fractures.  There is lytic lesion at the left sacrum.  Three small lucencies within  the calvarium nonspecific are seen.  MRI of the L-spine without and with contrast on January 02, 2011, is pending.  Bone marrow biopsy has been postponed by Interventional Radiology.  ASSESSMENT AND PLAN:  Dr. Arline Asp has seen and evaluated the patient and reviewed the chart.  This is a 70 year old white male seen initially on March 08, 2008, for evaluation of monoclonal free lambda light chains in urine and lesions on the L3 and left sacrum.  MRI of the L- spine today shows progression of the L3 destructive lesion, consistent with MRI done on February 24, 2008.  The patient now has pathologic fracture, with impression on the thecal sac and left L4 nerve root.  Dr. Franky Macho has indicated that he  will require surgical stabilization of the L-spine.  At this point, protein studies are inconclusive.  We will still wait for urine protein studies, serum immunofixation, bone marrow aspiration biopsy.  If these studies are not diagnostic, would suggest L3 needle biopsy if the patient declines surgery.  I have asked the Radiation Oncology to see the patient in consultation.  Vis-a-vis the patient's pulmonary emboli, and hypercoagulable workup was done on March 22, 2008, and was negative.  We will continue to follow.  Thank you very much for the consultation.     Marlowe Kays, PA-C   ______________________________ Samul Dada, M.D.    SW/MEDQ  D:  01/03/2011  T:  01/03/2011  Job:  [id number]  Electronically Signed by Marlowe Kays P.A. on 01/05/2011 09:46:06 AM Electronically Signed by Kimberlee Nearing M.D. on 01/05/2011 08:10:12 PM

## 2011-01-05 NOTE — Consult Note (Signed)
  [name], [name]            ACCOUNT NO.:  [id number]  MEDICAL RECORD NO.:  [id number]  LOCATION:  [id number]                          FACILITY:  [facility]  PHYSICIAN:  Larina Earthly, M.D.    DATE OF BIRTH:  09/23/1940  DATE OF CONSULTATION:  01/03/2011 DATE OF DISCHARGE:  12/31/2010                                CONSULTATION   REASON FOR CONSULTATION:  Consideration for anterior exposure of L3 compression fracture.  HISTORY OF PRESENT ILLNESS:  Rodney [name] is a 70 year old gentleman with a known history of a lesion in his L3 vertebra.  Presumptive diagnosis was a myeloma.  This had remained stable since 2006.  He presented with pathologic compression fraction of L3 and a CT scan showed lytic lesion of this vertebra and also of his sacrum.  He had been seen by Dr. Coletta Memos for neurosurgical consultation, and he has asked for [place] to consult for potential assisted anterior exposure for stabilization of his L3 disk.  The patient currently is more comfortable, but is unable to walk due to the instability of his spine.  PAST MEDICAL HISTORY:  Significant for prostate cancer with resection. He does have a history of pulmonary embolus on 2 different occasions, 1 following his prostate surgery.  He is on chronic Coumadin therapy.  SOCIAL HISTORY:  He is not a smoker.  FAMILY HISTORY:  Positive for PE.  ALLERGIES:  None.  REVIEW OF SYSTEMS:  Does have a history of pancreatitis in the past as well.  Does have a history of prior cholecystectomy.  PHYSICAL EXAMINATION:  GENERAL:  Well-developed, well-nourished white male in no acute distress. VITAL SIGNS:  Blood pressure is 114/73, temp 97.8, heart rate 67, respirations 17, oxygen saturations are 92% on room air. HEENT:  Normal.  He has 2+ radial, femoral and 2+ dorsalis pedis pulses bilaterally. CHEST:  Respirations are nonlabored. HEART:  Regular rate and rhythm. ABDOMEN:  Soft, nontender.  Does have a relatively wide costal  margin, has old gallbladder laparoscopic scar.  He has no masses and no tenderness. EXTREMITIES:  No deformities. NEUROLOGIC:  Grossly intact. PSYCHIATRIC:  Normal affect.  I reviewed his films and discussed exposure with the patient and family. I exposed that typical anterior exposure was for degenerative disk disease.  This was somewhat higher than the typical exposure that we are accustomed to performing.  I did explain that our role was Vascular Surgery for a safe anterior exposure for procedure that is dictated as needed by Dr. Franky Macho.  I explained to him that one of my partners, Dr. Cari Caraway will be available on Friday for the surgery, and we will discuss this with him further on January 04, 2011.  All questions were answered.     Larina Earthly, M.D.     TFE/MEDQ  D:  01/03/2011  T:  01/04/2011  Job:  [id number]  Electronically Signed by Elah Avellino M.D. on 01/05/2011 06:20:09 AM

## 2011-01-06 ENCOUNTER — Inpatient Hospital Stay (HOSPITAL_COMMUNITY): Payer: Medicare Other

## 2011-01-06 DIAGNOSIS — C9000 Multiple myeloma not having achieved remission: Secondary | ICD-10-CM

## 2011-01-06 DIAGNOSIS — G8918 Other acute postprocedural pain: Secondary | ICD-10-CM

## 2011-01-06 DIAGNOSIS — J9600 Acute respiratory failure, unspecified whether with hypoxia or hypercapnia: Secondary | ICD-10-CM

## 2011-01-06 LAB — BASIC METABOLIC PANEL
BUN: 9 mg/dL (ref 6–23)
CO2: 22 mEq/L (ref 19–32)
Chloride: 105 mEq/L (ref 96–112)
Creatinine, Ser: 0.71 mg/dL (ref 0.50–1.35)
Glucose, Bld: 113 mg/dL — ABNORMAL HIGH (ref 70–99)

## 2011-01-06 LAB — PREPARE FRESH FROZEN PLASMA

## 2011-01-06 LAB — CK TOTAL AND CKMB (NOT AT ARMC)
CK, MB: 11.4 ng/mL (ref 0.3–4.0)
Relative Index: 0.7 (ref 0.0–2.5)
Total CK: 1687 U/L — ABNORMAL HIGH (ref 7–232)

## 2011-01-06 LAB — CBC
HCT: 30.2 % — ABNORMAL LOW (ref 39.0–52.0)
HCT: 26.7 % — ABNORMAL LOW (ref 39.0–52.0)
MCHC: 35.6 g/dL (ref 30.0–36.0)
MCV: 87.5 fL (ref 78.0–100.0)
RBC: 3.45 MIL/uL — ABNORMAL LOW (ref 4.22–5.81)
RDW: 15.3 % (ref 11.5–15.5)
WBC: 12 10*3/uL — ABNORMAL HIGH (ref 4.0–10.5)

## 2011-01-06 LAB — HEPARIN LEVEL (UNFRACTIONATED): Heparin Unfractionated: <0.1 IU/mL — ABNORMAL LOW (ref 0.30–0.70)

## 2011-01-06 LAB — TROPONIN I: Troponin I: <0.3 ng/mL (ref <0.30)

## 2011-01-06 LAB — PROCALCITONIN: Procalcitonin: <0.1 ng/mL

## 2011-01-06 LAB — PRO B NATRIURETIC PEPTIDE: Pro B Natriuretic peptide (BNP): 41.1 pg/mL (ref 0–125)

## 2011-01-06 MED ORDER — DIAZEPAM 5 MG/ML IJ SOLN: 5.0000 mg | Freq: Four times a day (QID) | INTRAMUSCULAR | Status: DC | PRN

## 2011-01-06 MED ORDER — MORPHINE SULFATE 2 MG/ML IJ SOLN: 2.0000 mg | INTRAMUSCULAR | Status: DC | PRN

## 2011-01-06 MED ORDER — PHENOL 1.4 % MT LIQD: 1.0000 | OROMUCOSAL | Status: DC | PRN

## 2011-01-06 MED ORDER — HYDROMORPHONE 0.3 MG/ML IV SOLN: INTRAVENOUS | Status: DC

## 2011-01-06 MED ORDER — DOCUSATE SODIUM 50 MG/5ML PO LIQD
100.0000 mg | Freq: Two times a day (BID) | ORAL | Status: DC
  Administered 2011-01-07 – 2011-01-14 (×13): 100 mg
  Filled 2011-01-06 (×23): qty 10

## 2011-01-06 MED ORDER — PANTOPRAZOLE SODIUM 40 MG IV SOLR
40.0000 mg | Freq: Two times a day (BID) | INTRAVENOUS | Status: DC
  Administered 2011-01-07: 40 mg via INTRAVENOUS
  Filled 2011-01-06 (×5): qty 40

## 2011-01-06 MED ORDER — OXYCODONE HCL 5 MG PO TABS
5.0000 mg | ORAL_TABLET | ORAL | Status: DC | PRN
  Administered 2011-01-07 (×3): 10 mg via ORAL
  Administered 2011-01-07: 5 mg via ORAL
  Administered 2011-01-07: 10 mg via ORAL
  Administered 2011-01-08 (×3): 5 mg via ORAL
  Administered 2011-01-08: 10 mg via ORAL
  Administered 2011-01-09 (×2): 5 mg via ORAL
  Administered 2011-01-10 (×3): 10 mg via ORAL
  Administered 2011-01-11 (×2): 5 mg via ORAL
  Administered 2011-01-11 – 2011-01-12 (×3): 10 mg via ORAL
  Administered 2011-01-13 – 2011-01-14 (×3): 5 mg via ORAL
  Filled 2011-01-06: qty 1
  Filled 2011-01-06 (×2): qty 2
  Filled 2011-01-06: qty 1
  Filled 2011-01-06: qty 2
  Filled 2011-01-06: qty 1
  Filled 2011-01-06: qty 2
  Filled 2011-01-06: qty 1
  Filled 2011-01-06 (×2): qty 2

## 2011-01-06 MED ORDER — DIAZEPAM 5 MG/ML IJ SOLN
5.0000 mg | Freq: Four times a day (QID) | INTRAMUSCULAR | Status: DC | PRN
  Administered 2011-01-07: 5 mg via INTRAVENOUS
  Administered 2011-01-07: 3 mg via INTRAVENOUS
  Filled 2011-01-06: qty 2

## 2011-01-06 MED ORDER — PHENYLEPHRINE HCL 10 MG/ML IJ SOLN
30.0000 ug/min | INTRAVENOUS | Status: DC
  Filled 2011-01-06: qty 2

## 2011-01-06 MED ORDER — HYPROMELLOSE (GONIOSCOPIC) 2.5 % OP SOLN: 1.0000 [drp] | Freq: Two times a day (BID) | OPHTHALMIC | Status: DC | PRN

## 2011-01-06 MED ORDER — DIAZEPAM 5 MG PO TABS: 5.0000 mg | ORAL_TABLET | Freq: Four times a day (QID) | ORAL | Status: DC | PRN

## 2011-01-06 MED ORDER — OXYCODONE HCL 10 MG PO TB12
10.0000 mg | ORAL_TABLET | Freq: Two times a day (BID) | ORAL | Status: DC
  Administered 2011-01-07 – 2011-01-13 (×12): 10 mg via ORAL
  Filled 2011-01-06 (×4): qty 1

## 2011-01-06 MED ORDER — DIAZEPAM 5 MG PO TABS
5.0000 mg | ORAL_TABLET | Freq: Four times a day (QID) | ORAL | Status: DC | PRN
  Administered 2011-01-07 – 2011-01-11 (×12): 5 mg via ORAL
  Filled 2011-01-06: qty 1

## 2011-01-07 DIAGNOSIS — J9600 Acute respiratory failure, unspecified whether with hypoxia or hypercapnia: Secondary | ICD-10-CM

## 2011-01-07 DIAGNOSIS — C9000 Multiple myeloma not having achieved remission: Secondary | ICD-10-CM

## 2011-01-07 DIAGNOSIS — G8918 Other acute postprocedural pain: Secondary | ICD-10-CM

## 2011-01-07 LAB — CBC
HCT: 23.2 % — ABNORMAL LOW (ref 39.0–52.0)
MCH: 30.9 pg (ref 26.0–34.0)
MCHC: 34.5 g/dL (ref 30.0–36.0)
MCV: 89.8 fL (ref 78.0–100.0)
MCV: 89.6 fL (ref 78.0–100.0)
Platelets: 151 10*3/uL (ref 150–400)
Platelets: 142 10*3/uL — ABNORMAL LOW (ref 150–400)
RDW: 14.7 % (ref 11.5–15.5)
RDW: 14.6 % (ref 11.5–15.5)
WBC: 14.3 10*3/uL — ABNORMAL HIGH (ref 4.0–10.5)
WBC: 13.5 10*3/uL — ABNORMAL HIGH (ref 4.0–10.5)

## 2011-01-07 MED ORDER — PANTOPRAZOLE SODIUM 40 MG PO TBEC
40.0000 mg | DELAYED_RELEASE_TABLET | Freq: Every day | ORAL | Status: DC
  Administered 2011-01-07 – 2011-01-15 (×8): 40 mg via ORAL
  Filled 2011-01-07 (×7): qty 1

## 2011-01-07 NOTE — Progress Notes (Addendum)
Follow up - Critical Care Medicine Note  Patient Details:    Rodney [name] is an 70 y.o. male. Brief history The patient is a 70/M with presumptive diagnosis of multiple myeloma. He has a lesion at L3 vertebra and has undergone L3 vertebrectomy for tumor and interbody arthrodesis L2-L4 by neurosurgery today. He is currently post-operative and CCM has been consulted for ventilator management. 01/05/11. Hx of PE x 2 on chronic anticoagulation at home  Lines/tubes  Microbiology/Sepsis markers:  Anti-infectives:    Best Practice/Protocols:    Consults:   Studies/events:  Subjective:    Overnight Issues:   Objective:  Vital signs  Patient Vitals for the past 3 hrs:  BP Temp Temp src Pulse Resp SpO2  01/07/11 1000 89/53 mmHg - - 73  19  98 %  01/07/11 0900 89/48 mmHg - - 76  18  99 %  01/07/11 0800 98/57 mmHg 98.1 F (36.7 C) Oral 73  21  100 %  ]  Hemodynamic parameters for last 24 hours:    11/03 0701 - 11/04 0700 In: 3997.3 [P.O.:330; I.V.:3667.3] Out: 4800 [Urine:4800]   Intake/Output Summary (Last 24 hours) at 01/07/11 1036 Last data filed at 01/07/11 0800  Gross per 24 hour  Intake 3997.33 ml  Output   5240 ml  Net -1242.67 ml    Physical Exam:  Genera: extubated Neuro: moves all 4s Chest: clear CVS: S1S2 PA: Soft, ND,NT Ext: No cyanosis, clubbing or edema. HEENT: No cyanosis, clubbing or edema.   Current Vent settings: [place] LAB RESULTS I have reviewed the patient's lab results. Lab Results  Component Value Date   NA 136 01/06/2011   K 4.2 01/06/2011   CL 105 01/06/2011   CO2 22 01/06/2011   Lab Results  Component Value Date   WBC 14.3* 01/07/2011   HGB 8.4* 01/07/2011   HCT 25.5* 01/07/2011   MCV 89.8 01/07/2011   PLT 151 01/07/2011   Lab Results  Component Value Date   BUN 9 01/06/2011   Lab Results  Component Value Date   CREATININE 0.71 01/06/2011   ABG    Component Value Date/Time   PHART 7.400 01/05/2011 2108   HCO3 21.9  01/05/2011 2108   TCO2 23.0 01/05/2011 2108   ACIDBASEDEF 2.1* 01/05/2011 2108   O2SAT 98.8 01/05/2011 2108    Additional lab data  Radiology none Assessment/Plan:   NEURO  Post-neurosurgery:  post spinal surgery L3 vert fx with myeloma   Plan: per NS  PULM  Acute Respiratory Failure (due to surgery for Lspine)   Plan: resolved.  Titrate oxygen, IS  CARDIO  No issues   Plan: monitor  RENAL  no issues   Plan: monitor  GI  no issues   Plan: adv diet  ID  no issues   Plan: monitor  HEME  Pulmonary Embolism (treated, prior coumadin use.  ) Anemia acute blood loss anemia Hx myeloma L3vert fx     Plan: hold all anticoag Monitor H/H  ENDO no issues   Plan: monitor  Global Issues   Apparent conflict s   In family    LOS: 7 days   Additional comments:None  Critical Care Total Time*: 30 Minutes  Shan Levans 01/07/2011  *Care during the described time interval was provided by me and/or other providers on the critical care team.  I have reviewed this patient's available data, including medical history, events of note, physical examination and test results as part of my evaluation.

## 2011-01-07 NOTE — Progress Notes (Signed)
Received PT consult order.  Reviewed chart.  Orders entered as part of automatic thoracic/lumbar spine surgery orders, and are not appropriate for this patient.  Patient on bedrest with HOB at 30 degrees only, awaiting additional surgery.  PT will sign off.

## 2011-01-07 NOTE — Progress Notes (Signed)
Subjective: Patient reports adequate analgesia, some left thigh numbness, no problems with legs  Objective: Vital signs in last 24 hours: Temp:  [98.1 F (36.7 C)-100.1 F (37.8 C)] 98.1 F (36.7 C) (11/04 0800) Pulse Rate:  [64-101] 76  (11/04 0900) Resp:  [15-24] 18  (11/04 0900) BP: (81-123)/(47-71) 89/48 mmHg (11/04 0900) SpO2:  [90 %-100 %] 99 % (11/04 0900) Weight:  [98.9 kg (218 lb 0.6 oz)] 218 lb 0.6 oz (98.9 kg) (11/04 0600)  Intake/Output from previous day: 11/03 0701 - 11/04 0700 In: 3997.3 [P.O.:330; I.V.:3667.3] Out: 4800 [Urine:4800] Intake/Output this shift: Total I/O In: -  Out: 440 [Urine:440]  Neurologic: Mental status: Alert, oriented, thought content appropriate. Lower ext. Motor exam normal  Lab Results:  [facility] 01/07/11 0850 01/06/11 1855  WBC 14.3* 14.1*  HGB 8.4* 9.5*  HCT 25.5* 26.7*  PLT 151 141*   BMET  Basename 01/06/11 1500  NA 136  K 4.2  CL 105  CO2 22  GLUCOSE 113*  BUN 9  CREATININE 0.71  CALCIUM 7.7*    Studies/Results: Dg Lumbar Spine 2-3 Views Ordered By Rmm  01/05/2011  *RADIOLOGY REPORT*  Clinical Data: L3 corpectomy, felt to L4 fusion.  LUMBAR SPINE - 2-3 VIEW  Comparison: MRI 01/02/2011  Findings: Two intraoperative spot images demonstrate corpectomy at L3 with placement of a metallic spacer from the inferior L2 to the superior L4 vertebral bodies.  Normal alignment.  IMPRESSION: As above.  Original Report Authenticated By: Cyndie Chime, M.[name].   Dg Chest Portable 1 View  01/06/2011  *RADIOLOGY REPORT*  Clinical Data: Post transfusion.  PORTABLE CHEST - 1 VIEW  Comparison: 07/28/2010  Findings: 0914 hours.  Endotracheal tube tip is 4.5 cm above the base of the carina.  Lung volumes are low.  Subsegmental atelectasis seen at the left base.  Subsegmental atelectasis at the right base on the previous study has resolved.  NG tube tip overlies the proximal stomach. Telemetry leads overlie the chest.  IMPRESSION: Low volume film  with left base atelectasis.  Original Report Authenticated By: ERIC A. MANSELL, M.[name].   Dg C-arm Gt 120 Min  01/05/2011  CLINICAL DATA: L3 corpectomy/ L2-4 fusion    C-ARM GT 120 MIN  Fluoroscopy was utilized by the requesting physician.  No radiographic  interpretation.      Assessment/Plan: Continue bedrest until 2nd stage surgery scheduled for 01/09/2011  LOS: 7 days     Rodney [name] 01/07/2011, 9:25 AM

## 2011-01-08 ENCOUNTER — Inpatient Hospital Stay (HOSPITAL_COMMUNITY): Payer: Medicare Other

## 2011-01-08 LAB — BASIC METABOLIC PANEL
Calcium: 8.2 mg/dL — ABNORMAL LOW (ref 8.4–10.5)
GFR calc Af Amer: >90 mL/min (ref >90)
GFR calc non Af Amer: >90 mL/min (ref >90)
Glucose, Bld: 97 mg/dL (ref 70–99)
Potassium: 3.6 mEq/L (ref 3.5–5.1)
Sodium: 139 mEq/L (ref 135–145)

## 2011-01-08 LAB — CBC
Hemoglobin: 8.2 g/dL — ABNORMAL LOW (ref 13.0–17.0)
Hemoglobin: 8.5 g/dL — ABNORMAL LOW (ref 13.0–17.0)
MCH: 30.6 pg (ref 26.0–34.0)
MCH: 30.1 pg (ref 26.0–34.0)
MCHC: 33.7 g/dL (ref 30.0–36.0)
Platelets: 150 10*3/uL (ref 150–400)
Platelets: 175 10*3/uL (ref 150–400)
RBC: 2.82 MIL/uL — ABNORMAL LOW (ref 4.22–5.81)
RDW: 14.6 % (ref 11.5–15.5)

## 2011-01-08 NOTE — Op Note (Signed)
NAMEARMOUR, VILLANUEVA            ACCOUNT NO.:  [id number]  MEDICAL RECORD NO.:  [id number]  LOCATION:                               FACILITY:  [facility]  PHYSICIAN:  Coletta Memos, M.D.     DATE OF BIRTH:  1941/01/11  DATE OF PROCEDURE: DATE OF DISCHARGE:                              OPERATIVE REPORT   PREOPERATIVE DIAGNOSIS:  L3 tumor.  POSTOPERATIVE DIAGNOSIS:  Multiple myeloma L3 left sacrum.  PROCEDURE: 1. Lateral approach for L3 vertebrectomy, L3 corpectomy. 2. Anterolateral arthrodesis with NuVasive instrumentation and     morselized allograft.  COMPLICATIONS:  None.  SURGEON:  Coletta Memos, MD  ASSISTANT:  Danae Orleans. Venetia Maxon, MD  ANESTHESIA:  General endotracheal.  ESTIMATED BLOOD LOSS:  4500.  Received 4 units of packed red cells and 1 unit FFP in the OR.  Pathology results was consistent with multiple myeloma.  INDICATIONS:  [name] is a 70 year old who sustained a pathologic fracture.  He has a mass in the L3 vertebral body, which has been present since 2006 and is grown since that time.  Secondary to the instability in the spine, I recommended and he agreed to undergo operative decompression and arthrodesis.  This is part one of a staged operation with placement of pedicle screw fixation to be done later.  OPERATIVE NOTE:  Mr. Fjelstad was intubated and placed under general anesthetic without difficulty.  We tried to place a Foley catheter, but were unsuccessful.  We did consult the urologist, Dr. Mena Goes who dictated under a separate note.  He stated that Mr. Heavner had strictures in his urethra.  He was able to place a catheter and at that point we were able to proceed with the case.  He positioned in a left lateral decubitus position, left side up, right side down.  We placed his iliac crest right at the break of the bed.  We taped him to the table and secured him.  We ensured that he was perpendicular to the floor and we were able to check that with  Radiology.  I then used a guide and marked on his skin.  The posterior margin of the disk space between L2 and 3 and L3 and 4.  I made an incision overlying the body of L3 about 3-4 cm in length.  I made another incision in about fingerbreadths layers posterior to that to guide in the retroperitoneal dissection.  I made a small opening and then using a Kelly clamp along with my finger, I was able to go through the external oblique, internal oblique, and then finally the transversalis entering the retroperitoneal space.  With sharp dissection and cerebral-weighted tissue, I was able to palpate the transverse processes of L4.  Using that finger as a guide, I placed a dilator and got it down to the retroperitoneal mass over the disk space docking at approximately two-thirds of the way posterior in the disk space, time is centered in the disk space.  When I had that location down, I placed a K-wire and then used a series of dilators until I was able to place a retractor and center myself in a rostral caudal direction over the disk space and then  the anterior posterior direction again approximately two-thirds of the way posterior. Then with Dr. Fredrich Birks assistance, I performed an annulotomy and we then proceeded with a diskectomy.  We used a Cobb curette to remove disk from the endplate of L4.  I removed more disk material using box cutters and curettes to remove disk material.  After extending beyond the lateral margin of the vertebral body with my Cobb curette again then used another device from the NuVasive tray to make sure I had gone through the entire length of the disk space and had gone through the anulus on the opposite side.  Once that was done, I then removed the retractor. Again in the same fashion retroperitoneally I docked the dilator over the L2-3 disk space in the same fashion approximately two-thirds the way back center rostrally and caudally in the disk space.  I placed a  K-wire and then a series of dilators and finally the retractor.  Using fluoroscopy we were able to ensure that the retractor was in good position.  At this time again used a Cobb curette to remove the disk material from the endplate of L2.  I used pituitary rongeurs, curettes, box cutter, a number of other instruments to perform the preliminary diskectomy.  Having completed that, I then removed the retractor once more and again using a retroperitoneal approach was able to place a retractor over the L3 vertebral body.  I placed an anterior retractor also and then proceeded with the corpectomy.  I decompressed the spinal canal by performing a corpectomy of L3 removing tumor sending that to the pathologist.  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  Then with again Dr. Fredrich Birks assistance performed the corpectomy and decompressed the spinal canal, but more importantly was able to achieve good bone for placement of my interbody prosthesis.  I completed my lateral arthrodesis by using NuVasive adjustable interbody titanium device.  That was packed with morselized allograft. The disk with fluoroscopic guidance, and was able to place this without difficulty, and in good position.  I then irrigated the wound.  During this entire time, there was significant bleeding from the corpectomy site, but this slowed considerably after the interbody device was placed.  I did use some FloSeal and Gelfoam and actually at that point, the bleeding has slowed significantly.  I then removed the retractor and inspected my opening.  I then irrigated the wound.  I then closed both incisions in layered fashion using Steri-Strips over the vertebral body incision and I used Dermabond for sterile dressing.  He tolerated procedure well, remained intubated, but was allowed to wake up, so we could see he was moving his legs.  He will be taken to the neuro intensive care unit for his  followup.          ______________________________ Coletta Memos, M.D.     KC/MEDQ  D:  01/05/2011  T:  01/06/2011  Job:  [id number]  Electronically Signed by Coletta Memos M.D. on 01/08/2011 06:25:17 PM

## 2011-01-08 NOTE — Progress Notes (Signed)
Subjective: Patient reports pain is the same  Objective: Vital signs in last 24 hours: Temp:  [98.1 F (36.7 C)-98.7 F (37.1 C)] 98.1 F (36.7 C) (11/04 1616) Pulse Rate:  [63-78] 70  (11/05 0900) Resp:  [11-22] 18  (11/05 0900) BP: (73-107)/(38-59) 104/59 mmHg (11/05 0900) SpO2:  [92 %-100 %] 98 % (11/05 0900)  Intake/Output from previous day: 11/04 0701 - 11/05 0700 In: 1726.7 [P.O.:900; I.V.:826.7] Out: 3105 [Urine:3105] Intake/Output this shift: Total I/O In: 40 [P.O.:40] Out: 525 [Urine:525]    Lab Results:  [facility] 01/08/11 0655 01/07/11 1643  WBC 12.4* 13.5*  HGB 8.2* 8.0*  HCT 24.3* 23.2*  PLT 150 142*   BMET  Basename 01/06/11 1500  NA 136  K 4.2  CL 105  CO2 22  GLUCOSE 113*  BUN 9  CREATININE 0.71  CALCIUM 7.7*    Studies/Results: Dg Chest Portable 1 View  01/08/2011  *RADIOLOGY REPORT*  Clinical Data: Atelectasis, follow-up  PORTABLE CHEST - 1 VIEW  Comparison: Portable chest x-ray of 01/06/2011  Findings: Linear atelectasis remains at both lung bases.  No focal infiltrate or effusion is seen.  Mild cardiomegaly is stable.  No bony abnormality is noted.  IMPRESSION: Persistent linear opacities at the lung bases most consistent with atelectasis.  Stable cardiomegaly.  Original Report Authenticated By: Juline Patch, M.D.   Dg Chest Portable 1 View  01/06/2011  *RADIOLOGY REPORT*  Clinical Data: Post transfusion.  PORTABLE CHEST - 1 VIEW  Comparison: 07/28/2010  Findings: 0914 hours.  Endotracheal tube tip is 4.5 cm above the base of the carina.  Lung volumes are low.  Subsegmental atelectasis seen at the left base.  Subsegmental atelectasis at the right base on the previous study has resolved.  NG tube tip overlies the proximal stomach. Telemetry leads overlie the chest.  IMPRESSION: Low volume film with left base atelectasis.  Original Report Authenticated By: ERIC A. MANSELL, M.D.    Assessment/Plan: Patient is scheduled for or tomorrow. noormal  movement in all extremities. 5/5 strength upper and lower extremities. Wound clean and dry. No signs infection. Plan posterior approach, prdicle screw fixation l2-l4.  LOS: 8 days     Rodney [name] L 01/08/2011, 9:24 AM

## 2011-01-08 NOTE — Progress Notes (Signed)
CSW completed psychosocial assessment, please see shadow chart for details.  CSW phoned pt's wife and offered emotional support.  CSW introduced self and explained role.  CSW provided opportunity for pt's wife to express any concerns and/or questions.  Pt's wife stated, "oh yea, it has been stressful, but we are working through it".  Wife did not offer any other details at this time.  CSW validated feelings associated with hospitalizations.  CSW encouraged wife to call with any questions and/or concerns related to psychosocial needs.  CSW to continue to follow and assist as needed.  CSW staffed case with RNCM for potential dc home, if medically appropriate.

## 2011-01-08 NOTE — Progress Notes (Signed)
OT DISCHARGE NOTE  Patient is being discharged from  OTservices secondary to:   Medical decline- MD asked therapy to hold at this time: will need to re-order to resume therapy services.   Progress and discharge plan and discussed with patient/caregiver and they   Patient unable to participate in discharge planning

## 2011-01-08 NOTE — Progress Notes (Signed)
Rodney [name] is a 70 y.o. male admitted on 12/31/2010 with low back pain 2nd to pathologic fracture and suspected multiple myeloma. PMHx PE on lifelong coumadin, Prostate cancer s/p protatectomy  Line/tube: ETT 11/02>>11/03  Best practice/protocols: Protonix Coumadin>>held  Consults: Neurosurgery  Tests/events: 10/29: Bone scan>>compression fx L3 with lytic lesion ?myeloma, lytic lesion Lt sacrum, 3 small lucencies in calvarium 10/30: MRI lumbar spine>>L3 pathologic fx with retropulsion of tumor, Lt sacral lesion incompletely assessed 11/02:  L3 vertebrectomy for tumor and interbody arthrodesis L2-L4 by neurosurgery SUBJECTIVE:  Very concerned about his work situation and how this is being impacted by his health problems.  OBJECTIVE:  Blood pressure 94/63, pulse 89, temperature 98.2 F (36.8 C), temperature source Oral, resp. rate 17, height 5\' 8"  (1.727 m), weight 218 lb 0.6 oz (98.9 kg), SpO2 96.00%.   Intake/Output Summary (Last 24 hours) at 01/08/11 1048 Last data filed at 01/08/11 1000  Gross per 24 hour  Intake 1721.67 ml  Output   3390 ml  Net -1668.33 ml   General - depressed HEENT - pupils reactive Cardiac - s1s2 regular, no murmur Chest - no wheeze/rales Abd - soft, nontender Ext - no edema Neuro - moves all extremities   Dg Chest Portable 1 View  01/08/2011  *RADIOLOGY REPORT*  Clinical Data: Atelectasis, follow-up   PORTABLE CHEST - 1 VIEW  Comparison: Portable chest x-ray of 01/06/2011   Findings: Linear atelectasis remains at both lung bases.  No focal infiltrate or effusion is seen.  Mild cardiomegaly is stable.  No bony abnormality is noted.  IMPRESSION: Persistent linear opacities at the lung bases most consistent with atelectasis.  Stable cardiomegaly.   Original Report Authenticated By: Juline Patch, M.D.   Lab Results  Component Value Date   WBC 11.5* 01/08/2011   HGB 8.5* 01/08/2011   HCT 25.4* 01/08/2011   MCV 90.1 01/08/2011   PLT 175  01/08/2011   Lab Results  Component Value Date   CREATININE 0.73 01/08/2011   BUN 11 01/08/2011   NA 139 01/08/2011   K 3.6 01/08/2011   CL 106 01/08/2011   CO2 26 01/08/2011      ASSESSMENT/PLAN:  Pathologic L3 fracture with concern for multiple myeloma -plan for return to OR 11/06 -f/u path results -continue current pain regimen  Hx of recurrent PE -coumadin on hold due to neurosurgical issues -?if he will need IVC filter  Anemia -2nd to intra-operative bleeding -f/u CBC -transfuse for Hb < 7  Hypotension -monitor BP -would try fluid bolus first if BP remains decreased  Lois Slagel 01/08/2011, 10:48 AM

## 2011-01-09 ENCOUNTER — Encounter (HOSPITAL_COMMUNITY): Payer: Self-pay | Admitting: Anesthesiology

## 2011-01-09 ENCOUNTER — Inpatient Hospital Stay (HOSPITAL_COMMUNITY): Payer: Medicare Other

## 2011-01-09 ENCOUNTER — Encounter (HOSPITAL_COMMUNITY)
Admission: RE | Disposition: A | Discharge status: Rehabilitation Facility | Payer: Self-pay | Source: Other Acute Inpatient Hospital | Attending: Internal Medicine

## 2011-01-09 ENCOUNTER — Inpatient Hospital Stay (HOSPITAL_COMMUNITY): Payer: Medicare Other | Admitting: Anesthesiology

## 2011-01-09 DIAGNOSIS — J9600 Acute respiratory failure, unspecified whether with hypoxia or hypercapnia: Secondary | ICD-10-CM

## 2011-01-09 DIAGNOSIS — G8918 Other acute postprocedural pain: Secondary | ICD-10-CM

## 2011-01-09 DIAGNOSIS — C9000 Multiple myeloma not having achieved remission: Secondary | ICD-10-CM

## 2011-01-09 LAB — CBC
HCT: 27.3 % — ABNORMAL LOW (ref 39.0–52.0)
Platelets: 221 10*3/uL (ref 150–400)
RBC: 3.02 MIL/uL — ABNORMAL LOW (ref 4.22–5.81)
RDW: 14.2 % (ref 11.5–15.5)
WBC: 10.8 10*3/uL — ABNORMAL HIGH (ref 4.0–10.5)

## 2011-01-09 LAB — TYPE AND SCREEN
ABO/RH(D): B POS
ABO/RH(D): B POS
Antibody Screen: NEGATIVE
Antibody Screen: NEGATIVE
Unit division: 0
Unit division: 0
Unit division: 0
Unit division: 0
Unit division: 0
Unit division: 0
Unit division: 0
Unit division: 0
Unit division: 0
Unit division: 0

## 2011-01-09 SURGERY — POSTERIOR LUMBAR FUSION 2 LEVEL
Anesthesia: General

## 2011-01-09 MED ORDER — THROMBIN 20000 UNITS EX KIT: PACK | CUTANEOUS | Status: DC | PRN

## 2011-01-09 MED ORDER — CEFAZOLIN SODIUM 1-5 GM-% IV SOLN
INTRAVENOUS | Status: DC | PRN
  Administered 2011-01-09: 1 g via INTRAVENOUS

## 2011-01-09 MED ORDER — MIDAZOLAM HCL 5 MG/5ML IJ SOLN
INTRAMUSCULAR | Status: DC | PRN
  Administered 2011-01-09: 2 mg via INTRAVENOUS

## 2011-01-09 MED ORDER — GLYCOPYRROLATE 0.2 MG/ML IJ SOLN
INTRAMUSCULAR | Status: DC | PRN
  Administered 2011-01-09: .6 mg via INTRAVENOUS

## 2011-01-09 MED ORDER — MORPHINE SULFATE 2 MG/ML IJ SOLN
2.0000 mg | INTRAMUSCULAR | Status: DC | PRN
  Administered 2011-01-10: 5 mg via INTRAVENOUS

## 2011-01-09 MED ORDER — NEOSTIGMINE METHYLSULFATE 1 MG/ML IJ SOLN
INTRAMUSCULAR | Status: DC | PRN
  Administered 2011-01-09: 5 mg via INTRAVENOUS

## 2011-01-09 MED ORDER — SUFENTANIL CITRATE 50 MCG/ML IV SOLN
INTRAVENOUS | Status: DC | PRN
  Administered 2011-01-09: 10 ug via INTRAVENOUS
  Administered 2011-01-09: 20 ug via INTRAVENOUS

## 2011-01-09 MED ORDER — MEPERIDINE HCL 25 MG/ML IJ SOLN: 6.2500 mg | INTRAMUSCULAR | Status: DC | PRN

## 2011-01-09 MED ORDER — PROMETHAZINE HCL 25 MG/ML IJ SOLN
6.2500 mg | INTRAMUSCULAR | Status: DC | PRN
  Filled 2011-01-09: qty 1

## 2011-01-09 MED ORDER — ROCURONIUM BROMIDE 100 MG/10ML IV SOLN
INTRAVENOUS | Status: DC | PRN
  Administered 2011-01-09: 50 mg via INTRAVENOUS

## 2011-01-09 MED ORDER — PROPOFOL 10 MG/ML IV EMUL
INTRAVENOUS | Status: DC | PRN
  Administered 2011-01-09: 130 mg via INTRAVENOUS

## 2011-01-09 MED ORDER — HYDROMORPHONE HCL PF 1 MG/ML IJ SOLN: 0.2500 mg | INTRAMUSCULAR | Status: DC | PRN

## 2011-01-09 MED ORDER — THROMBIN 20000 UNITS EX KIT
PACK | CUTANEOUS | Status: DC | PRN
  Administered 2011-01-09: 19:00:00 via TOPICAL

## 2011-01-09 MED ORDER — LIDOCAINE-EPINEPHRINE 0.5-1:200000 % IJ SOLN
INTRAMUSCULAR | Status: DC | PRN
  Administered 2011-01-09: 20 mL via INTRADERMAL

## 2011-01-09 MED ORDER — BUPIVACAINE LIPOSOME 1.3 % IJ SUSP
20.0000 mL | Freq: Once | INTRAMUSCULAR | Status: DC
  Administered 2011-01-09: 266 mg
  Filled 2011-01-09: qty 20

## 2011-01-09 MED ORDER — BUPIVACAINE LIPOSOME 1.3 % IJ SUSP
INTRAMUSCULAR | Status: DC | PRN
  Administered 2011-01-09: 20 mL

## 2011-01-09 MED ORDER — LACTATED RINGERS IV SOLN
INTRAVENOUS | Status: DC | PRN
  Administered 2011-01-09 (×3): via INTRAVENOUS

## 2011-01-09 SURGICAL SUPPLY — 61 items
BAG DECANTER FOR FLEXI CONT (MISCELLANEOUS) IMPLANT
BENZOIN TINCTURE PRP APPL 2/3 (GAUZE/BANDAGES/DRESSINGS) IMPLANT
BLADE SURG ROTATE 9660 (MISCELLANEOUS) ×2 IMPLANT
BUR MATCHSTICK NEURO 3.0 LAGG (BURR) ×2 IMPLANT
BUR ROUND FLUTED 5 RND (BURR) ×2 IMPLANT
CANISTER SUCTION 2500CC (MISCELLANEOUS) ×2 IMPLANT
CLOTH BEACON ORANGE TIMEOUT ST (SAFETY) ×2 IMPLANT
CONT SPEC 4OZ CLIKSEAL STRL BL (MISCELLANEOUS) ×2 IMPLANT
COVER BACK TABLE 24X17X13 BIG (DRAPES) IMPLANT
DECANTER SPIKE VIAL GLASS SM (MISCELLANEOUS) ×2 IMPLANT
DERMABOND ADVANCED (GAUZE/BANDAGES/DRESSINGS) ×1
DERMABOND ADVANCED .7 DNX12 (GAUZE/BANDAGES/DRESSINGS) ×1 IMPLANT
DRAPE C-ARM 42X72 X-RAY (DRAPES) ×6 IMPLANT
DRAPE LAPAROTOMY 100X72X124 (DRAPES) ×2 IMPLANT
DRAPE POUCH INSTRU U-SHP 10X18 (DRAPES) ×2 IMPLANT
DRAPE PROXIMA HALF (DRAPES) ×2 IMPLANT
DRAPE SURG 17X23 STRL (DRAPES) ×2 IMPLANT
DRESSING TELFA 8X3 (GAUZE/BANDAGES/DRESSINGS) IMPLANT
DURAPREP 26ML APPLICATOR (WOUND CARE) ×2 IMPLANT
ELECT REM PT RETURN 9FT ADLT (ELECTROSURGICAL) ×2
ELECTRODE REM PT RTRN 9FT ADLT (ELECTROSURGICAL) ×1 IMPLANT
GAUZE SPONGE 4X4 16PLY XRAY LF (GAUZE/BANDAGES/DRESSINGS) IMPLANT
GLOVE ECLIPSE 6.5 STRL STRAW (GLOVE) ×4 IMPLANT
GLOVE EXAM NITRILE LRG STRL (GLOVE) IMPLANT
GLOVE EXAM NITRILE MD LF STRL (GLOVE) IMPLANT
GLOVE EXAM NITRILE XL STR (GLOVE) IMPLANT
GLOVE EXAM NITRILE XS STR PU (GLOVE) IMPLANT
GLOVE SURG SS PI 6.5 STRL IVOR (GLOVE) ×6 IMPLANT
GOWN BRE IMP SLV AUR LG STRL (GOWN DISPOSABLE) ×4 IMPLANT
GOWN BRE IMP SLV AUR XL STRL (GOWN DISPOSABLE) ×4 IMPLANT
GOWN STRL REIN 2XL LVL4 (GOWN DISPOSABLE) IMPLANT
KIT BASIN OR (CUSTOM PROCEDURE TRAY) ×2 IMPLANT
KIT POSITION SURG JACKSON T1 (MISCELLANEOUS) ×2 IMPLANT
KIT ROOM TURNOVER OR (KITS) ×2 IMPLANT
LUMBAR 5CM MAGNIFUSE (Bone Implant) ×2 IMPLANT
Magnifuse (Bone Implant) ×2 IMPLANT
NEEDLE HYPO 25X1 1.5 SAFETY (NEEDLE) ×2 IMPLANT
NEEDLE SPNL 18GX3.5 QUINCKE PK (NEEDLE) ×2 IMPLANT
NS IRRIG 1000ML POUR BTL (IV SOLUTION) ×2 IMPLANT
PACK LAMINECTOMY NEURO (CUSTOM PROCEDURE TRAY) ×2 IMPLANT
PAD ARMBOARD 7.5X6 YLW CONV (MISCELLANEOUS) ×6 IMPLANT
ROD SOLERA 60MM (Rod) ×2 IMPLANT
SCREW MAS 4.5X30 (Screw) ×2 IMPLANT
SCREW MAS 5.5X45 (Screw) ×4 IMPLANT
SCREW MAS 6.5X45 (Screw) ×2 IMPLANT
SCREW SET SOLERA (Screw) ×4 IMPLANT
SCREW SET SOLERA TI (Screw) ×4 IMPLANT
SPONGE GAUZE 4X4 12PLY (GAUZE/BANDAGES/DRESSINGS) IMPLANT
SPONGE LAP 4X18 X RAY DECT (DISPOSABLE) IMPLANT
SPONGE SURGIFOAM ABS GEL 100 (HEMOSTASIS) ×2 IMPLANT
STRIP CLOSURE SKIN 1/2X4 (GAUZE/BANDAGES/DRESSINGS) IMPLANT
SUT PROLENE 6 0 BV (SUTURE) IMPLANT
SUT VIC AB 0 CT1 18XCR BRD8 (SUTURE) ×1 IMPLANT
SUT VIC AB 0 CT1 8-18 (SUTURE) ×1
SUT VIC AB 2-0 CT1 18 (SUTURE) ×2 IMPLANT
SUT VIC AB 3-0 SH 8-18 (SUTURE) ×2 IMPLANT
SYR 20ML ECCENTRIC (SYRINGE) ×2 IMPLANT
Solera Rod (Rod) ×2 IMPLANT
TOWEL OR 17X24 6PK STRL BLUE (TOWEL DISPOSABLE) ×2 IMPLANT
TOWEL OR 17X26 10 PK STRL BLUE (TOWEL DISPOSABLE) ×2 IMPLANT
WATER STERILE IRR 1000ML POUR (IV SOLUTION) ×2 IMPLANT

## 2011-01-09 NOTE — Op Note (Signed)
Op note preop dx: l3 pathologic fracture. 2.  Post op dx:L3 pathologic fracture Surgeon: Coletta Memos Anesthesia: General Endotracheal Complications: None Procedure:L2-L4 Posterolateral Arthrodesis, Morcellized allograft Non-segmental pedicle screw fixation(Solera)L2-L4  Indications:Rodney [name] presented with a pathologic fracture of L3. Anterior decompression and arthrodesis performed on 01/06/2011. Bx consistent with multiple myeloma.He is taken to the or today for part 2 of a staged procedure to stabilize the spine. Operative note [name] was taken to the operating room,intubated and placed under a general anesthetic without difficulty. He was then turned prone onto a Jackson table, with all pressure points properly padded. His back was prepped in a sterile fashion with duraprep. A localizing xray was performed, the needle was directed to the spinous process of L4.He was draped in a sterile fashion. I infiltrated 20cc lidocaine with 1/200,000 strength epinephrine into my proposed incision. I opened the skin with a number 10 blade down to the thoracolumbar fascia. I exposed the lamina of L2,L3,and L4 bilaterally. This was confirmed by intraoperative xray. I exposed the pars of L2 and L4 bilaterally. I placed pedicle screws into the pedicles of L2, and L4 bilaterally, using flouroscopy. I first drilled then tapped each pedicle. At L2 on the right I attempted a medial to lateral cortical pedicle screw but did breach the cortex. I then place a standard screw into the Right L2 pedicle. I did place a medial to lateral cortical pedicle screw on the Left side without difficulty. Standard pedicle screws were place at L4 bilaterally.  I performed a posterolateral arthrodesis from L2-L4 using morcellized allograft after decorticating the lamina of L2, L3 and L4 bilaterally.   I completed the non-segmental construct by connecting rods to the screwheads, placing caps, and using a mild amount of  compression. Xray showed the construct to be in good position. I closed the wound in layered fashion with vicryl sutures, reapproximating the thoracolumbar fascia, subcutaneous, and subcuticular layers. I applied dermabond for a sterile dressing.  The patient was turned supine and extubated without difficulty.

## 2011-01-09 NOTE — Brief Op Note (Signed)
12/31/2010 - 01/09/2011  8:59 PM  PATIENT:  Rodney [name]  70 y.o. male  PRE-OPERATIVE DIAGNOSIS:  Lumbar Three Pathologic Fracture  POST-OPERATIVE DIAGNOSIS:  Lumbar Three Pathologic Fracture  PROCEDURE:  Procedure(s): POSTERIOR LUMBAR FUSION 2 LEVEL, l2-l4 Posterolateral arthrodesis, morsellized Allograft Non-segmental pedicle screw fixation l2-l4  SURGEON:  Surgeon(s): Kosisochukwu Goldberg L Annalissa Murphey  PHYSICIAN ASSISTANT:   ASSISTANTS: none   ANESTHESIA:   general  EBL:  Total I/O In: 1000 [I.V.:1000] Out: 200 [Urine:200]  BLOOD ADMINISTERED:none  DRAINS: Urinary Catheter (Foley)   LOCAL MEDICATIONS USED:  MARCAINE 20CC  SPECIMEN:  No Specimen  DISPOSITION OF SPECIMEN:  N/A  COUNTS:  YES  TOURNIQUET:  * No tourniquets in log *  DICTATION: .Note written in EPIC  PLAN OF CARE: Admit to inpatient   PATIENT DISPOSITION:  PACU - hemodynamically stable.   Delay start of Pharmacological VTE agent (>24hrs) due to surgical blood loss or risk of bleeding:  yes

## 2011-01-09 NOTE — Transfer of Care (Signed)
Immediate Anesthesia Transfer of Care Note  Patient: Rodney [name]  Procedure(s) Performed:  POSTERIOR LUMBAR FUSION 2 LEVEL - L2-4 PEDICLE SCREWS AND POSTERIOR LATERAL ARTHRODESIS  Patient Location: PACU  Anesthesia Type: General  Level of Consciousness: awake  Airway & Oxygen Therapy: Patient Spontanous Breathing and Patient connected to face mask oxygen  Post-op Assessment: Report given to PACU RN, Post -op Vital signs reviewed and stable and Patient moving all extremities  Post vital signs: stable  Complications: No apparent anesthesia complications

## 2011-01-09 NOTE — Transfer of Care (Signed)
Immediate Anesthesia Transfer of Care Note  Patient: Rodney [name]  Procedure(s) Performed:  POSTERIOR LUMBAR FUSION 2 LEVEL - L2-4 PEDICLE SCREWS AND POSTERIOR LATERAL ARTHRODESIS  Patient Location: PACU  Anesthesia Type: General  Level of Consciousness: awake  Airway & Oxygen Therapy: Patient Spontanous Breathing  Post-op Assessment: Report given to PACU RN  Post vital signs: stable  Complications: No apparent anesthesia complications

## 2011-01-09 NOTE — Progress Notes (Signed)
Dr Franky Macho here to see pt

## 2011-01-09 NOTE — Anesthesia Postprocedure Evaluation (Signed)
  Anesthesia Post-op Note  Patient: Rodney [name]  Procedure(s) Performed:  POSTERIOR LUMBAR FUSION 2 LEVEL - L2-4 PEDICLE SCREWS AND POSTERIOR LATERAL ARTHRODESIS  Patient Location: PACU  Anesthesia Type: General  Level of Consciousness: sedated  Airway and Oxygen Therapy: Patient Spontanous Breathing and Patient connected to nasal cannula oxygen  Post-op Pain: none  Post-op Assessment: Post-op Vital signs reviewed, Patient's Cardiovascular Status Stable, Respiratory Function Stable, Patent Airway and No signs of Nausea or vomiting  Post-op Vital Signs: stable  Complications: No apparent anesthesia complications

## 2011-01-09 NOTE — Preoperative (Signed)
Beta Blockers   Reason not to administer Beta Blockers:Not Applicable 

## 2011-01-09 NOTE — Progress Notes (Addendum)
Rodney [name] is a 70 y.o. male admitted on 12/31/2010 with low back pain 2nd to pathologic fracture and suspected multiple myeloma. PMHx PE on lifelong coumadin, Prostate cancer s/p protatectomy  Line/tube: ETT 11/02>>11/03  Best practice/protocols: Protonix Coumadin>>held  Consults: Neurosurgery  Tests/events: 10/29: Bone scan>>compression fx L3 with lytic lesion ?myeloma, lytic lesion Lt sacrum, 3 small lucencies in calvarium 10/30: MRI lumbar spine>>L3 pathologic fx with retropulsion of tumor, Lt sacral lesion incompletely assessed 11/02: L3 vertebrectomy for tumor and interbody arthrodesis L2-L4 by neurosurgery SUBJECTIVE:  Scheduled for add-on to OR today.  OBJECTIVE:  Blood pressure 90/47, pulse 74, temperature 97.7 F (36.5 C), temperature source Oral, resp. rate 19, height 5\' 8"  (1.727 m), weight 218 lb 0.6 oz (98.9 kg), SpO2 95.00%.   Intake/Output Summary (Last 24 hours) at 01/09/11 0807 Last data filed at 01/09/11 0700  Gross per 24 hour  Intake 1143.33 ml  Output   2430 ml  Net -1286.67 ml   General - depressed HEENT - pupils reactive Cardiac - s1s2 regular, no murmur Chest - no wheeze/rales Abd - soft, nontender Ext - no edema Neuro - moves all extremities    Lab Results  Component Value Date   WBC 10.8* 01/09/2011   HGB 9.1* 01/09/2011   HCT 27.3* 01/09/2011   MCV 90.4 01/09/2011   PLT 221 01/09/2011   Lab Results  Component Value Date   CREATININE 0.73 01/08/2011   BUN 11 01/08/2011   NA 139 01/08/2011   K 3.6 01/08/2011   CL 106 01/08/2011   CO2 26 01/08/2011      ASSESSMENT/PLAN:  Pathologic L3 fracture with concern for multiple myeloma -plan for return to OR 11/06 -f/u path results -continue current pain regimen  Hx of recurrent PE -coumadin on hold due to neurosurgical issues -?if he will need IVC filter  Anemia -2nd to intra-operative bleeding -f/u CBC -transfuse for Hb < 7  Hypotension -monitor BP -would try fluid bolus  first if BP remains decreased  Rodney [name] 01/09/2011, 8:07 AM  Ok for transfer to med/surg floor per neurosurgery.  Will transfer service to [facility] team 4 from 11/07 and PCCM will sign off.    Rodney [name] 01/09/2011, 12:06 PM

## 2011-01-09 NOTE — Anesthesia Preprocedure Evaluation (Addendum)
Anesthesia Evaluation  Patient identified by MRN, date of birth, ID band Patient awake    Reviewed: Allergy & Precautions, H&P , NPO status , Patient's Chart, lab work & pertinent test results, reviewed documented beta blocker date and time   Airway Mallampati: II TM Distance: >3 FB Neck ROM: Full    Dental  (+) Teeth Intact and Caps   Pulmonary  h o of PE x 2- on coumadin therapy at home. clear to auscultation        Cardiovascular Exercise Tolerance: Poor Regular     Neuro/Psych L3 vertebral lytic lesion with pathologic fracture- multiple myeloma dx 2006.  Presented with severe back pain    GI/Hepatic   Endo/Other    Renal/GU    H/o prostatectomy    Musculoskeletal   Abdominal   Peds  Hematology   Anesthesia Other Findings   Reproductive/Obstetrics                        Anesthesia Physical Anesthesia Plan  ASA: III  Anesthesia Plan: General   Post-op Pain Management:    Induction: Intravenous  Airway Management Planned: Oral ETT  Additional Equipment:   Intra-op Plan:   Post-operative Plan: Extubation in OR and Possible Post-op intubation/ventilation  Informed Consent:   Dental advisory given  Plan Discussed with: CRNA and Surgeon  Anesthesia Plan Comments: (70 y/o WM with hx of L3 compression fracture felt secondary to multiple myeloma 4 day s/p posterolateral L3 corpectomy now for second stage procedure in prone position.  Problems: 1. L3 compression fx felt secondary to mm 2. H/O prostate ca s/p radical prostatectomy 2003 3. Urethral stricture with difficult foley insertion 01/05/11 4. H/O PE times two. First following prostatectomy 5. Required 24 hour postop ventilation following surgery 01/05/11  6. Anemia  7. Mild confusion suspect due to sedative/analgesics  Plan GA  )        Anesthesia Quick Evaluation

## 2011-01-09 NOTE — Progress Notes (Signed)
BP 99/64  Pulse 71  Temp(Src) 97.8 F (36.6 C) (Oral)  Resp 18  Ht 5\' 8"  (1.727 m)  Wt 98.9 kg (218 lb 0.6 oz)  BMI 33.15 kg/m2  SpO2 93%  Rodney [name] is doing well today. He is set to go to the or for posterior fixation and arthrodesis. Risks and benefits of the procedure have been explained. This includes bleeding, infection, fusion failure, hardware failure, nerve damage, weakness in the lower extremities, bowel/bladder dysfunction, need for further surgery, possible transfusion, and loss of use of the lower extremities. He understands and wishes to proceed. Physical exam: Moving lower extremities well.  Wound clean, dry, no signs of infection Alert and oriented by 4 Speech  Clear and fluent

## 2011-01-09 NOTE — Progress Notes (Signed)
Blood pressure 124/56, pulse 75, temperature 99 F (37.2 C), temperature source Oral, resp. rate 18, height 5\' 8"  (1.727 m), weight 98.9 kg (218 lb 0.6 oz), SpO2 96.00%.Mr poitra is moving lower extremities well. Wound is clean, no sign of infection. Following all commands.

## 2011-01-09 NOTE — Anesthesia Procedure Notes (Signed)
Procedure Name: Intubation Date/Time: 01/09/2011 6:11 PM Performed by: Brien Mates DOBSON Pre-anesthesia Checklist: Patient identified, Emergency Drugs available, Suction available, Patient being monitored and Timeout performed Patient Re-evaluated:Patient Re-evaluated prior to inductionOxygen Delivery Method: Circle System Utilized Preoxygenation: Pre-oxygenation with 100% oxygen Intubation Type: IV induction Ventilation: Mask ventilation without difficulty Laryngoscope Size: Miller and 2 Grade View: Grade I Tube type: Oral Number of attempts: 1 Placement Confirmation: ETT inserted through vocal cords under direct vision,  positive ETCO2,  CO2 detector and breath sounds checked- equal and bilateral Secured at: 23 cm Tube secured with: Tape Dental Injury: Teeth and Oropharynx as per pre-operative assessment

## 2011-01-10 LAB — BASIC METABOLIC PANEL
BUN: 10 mg/dL (ref 6–23)
CO2: 24 mEq/L (ref 19–32)
Glucose, Bld: 110 mg/dL — ABNORMAL HIGH (ref 70–99)
Potassium: 4.1 mEq/L (ref 3.5–5.1)
Sodium: 136 mEq/L (ref 135–145)

## 2011-01-10 LAB — CBC
HCT: 27.1 % — ABNORMAL LOW (ref 39.0–52.0)
Hemoglobin: 9.3 g/dL — ABNORMAL LOW (ref 13.0–17.0)
RBC: 3.03 MIL/uL — ABNORMAL LOW (ref 4.22–5.81)

## 2011-01-10 MED ORDER — MORPHINE SULFATE 2 MG/ML IJ SOLN: 1.0000 mg | INTRAMUSCULAR | Status: DC | PRN

## 2011-01-10 MED ORDER — MORPHINE SULFATE 4 MG/ML IJ SOLN
INTRAMUSCULAR | Status: AC
  Administered 2011-01-10: 4 mg via INTRAVENOUS
  Filled 2011-01-10: qty 1

## 2011-01-10 MED ORDER — MORPHINE SULFATE 4 MG/ML IJ SOLN
2.0000 mg | INTRAMUSCULAR | Status: DC | PRN
  Administered 2011-01-10 (×3): 4 mg via INTRAVENOUS
  Filled 2011-01-10 (×2): qty 1

## 2011-01-10 MED ORDER — MORPHINE SULFATE 2 MG/ML IJ SOLN: 2.0000 mg | INTRAMUSCULAR | Status: DC | PRN

## 2011-01-10 NOTE — Consult Note (Signed)
Rodney [name], Rodney [name]            ACCOUNT NO.:  [id number]  MEDICAL RECORD NO.:  [id number]  LOCATION:  3111                         FACILITY:  [facility]  PHYSICIAN:  Jerilee Field, MD   DATE OF BIRTH:  10/29/1940  DATE OF CONSULTATION: DATE OF DISCHARGE:                                CONSULTATION   REFERRING PHYSICIAN:  Requested by Coletta Memos, MD for urethral stricture and difficult Foley catheter.  HISTORY OF PRESENT ILLNESS:  [name] is a 70 year old male who is undergoing spinal surgery for a lesion in his L3 vertebral body.  The patient was brought to the operating room and put under general anesthesia.  Foley catheter could then not be placed despite multiple attempts with adequate lubrication.  Of note, Mr. Proto had a radical retropubic prostatectomy for prostate cancer in March 2003.  According to his wife, recently he has been voiding with a weak stream, straining to urinate, and had a split stream on urination.  His regular urologist is Dr. Brunilda Payor and the patient has not followed up with Dr. Brunilda Payor since 2005.  Urology was consulted for Foley catheter placement as the patient will have a lengthy surgery and postop recovery.  PAST MEDICAL HISTORY:  Prostate cancer, status post radical retropubic prostatectomy, pulmonary embolism.  He has also been evaluated by Oncology for multiple myeloma.  He also has had a cholecystectomy.  SOCIAL HISTORY:  The patient lives with his wife in [place].  He is a Careers adviser.  He denies any smoking, alcohol, or drug use.  FAMILY HISTORY:  Unremarkable.  REVIEW OF SYSTEMS:  As per HPI, history from [facility] chart, [name] chart, and family.  As the patient is sedated, no other review of systems obtained.  ALLERGIES:  No known drug allergies.  MEDICATIONS:  Coumadin, Prilosec, naproxen, Percocet, and Tylenol.  PHYSICAL EXAMINATION:  GENERAL:  The patient is intubated and under general anesthesia in the  operating room. HEENT:  His head is atraumatic and normocephalic. NECK:  Soft and supple. CARDIOVASCULAR:  Regular rate and rhythm. LUNGS:  Again, the patient is intubated, breathing at regular respiratory rate and depth. ABDOMEN:  Soft and nontender.  There is a lower mid line scar, consistent with radical prostatectomy. GU:  The patient's penis and testicles are normal. RECTAL:  Though the patient's prostate is absent, there are no masses. After catheter placement, the balloon is palpable in the bladder and noted to be mobile.  DIAGNOSTIC DATA:  His recent BUN is 8, creatinine is 0.86.  MRI of the lumbar spine showed marked progressive abnormality of the L3 vertebral body.  DESCRIPTION OF PROCEDURE:  I discussed the patient with his son before I performed cystoscopy.  We discussed giving his history of radical prostatectomy and difficult Foley placement, he likely had a bladder neck contracture.  We discussed the nature, risks, and benefits of cystoscopy with dilation of bladder neck contracture.  We discussed risks of bleeding, infection, and incontinence among others.  We also discussed that this is a temporizing measure and that scar tissue such as the bladder neck contracture can and likely will recur.  All questions were answered and the son gave consent to proceed given that  his father was under general anesthesia and his mother was not present. On cystoscopy in the operating room, after the patient was prepped and draped in the usual fashion, the penile urethra appeared normal.  The membranous urethra was normal and just proximal to this, the bladder neck had a tight contracture.  I could see through a small lumen into the bladder, but the flexible cystoscope would not advance.  Therefore, a Glidewire was advanced through the cystoscope and coiled in the bladder and the cystoscope was removed, leaving the wire in place.  An Amplatz dilating set was then opened.  The sheath  was passed over the wire and sounded into the bladder.  Starting at 12-French, the bladder neck was sequentially dilated without difficulty up to 20-French.  The sheath was then removed, leaving the wire in place.  I performed cystoscopy again beside the wire which noted the bladder neck to be nicely dilated but with a thick white scar and minimal bleeding. Cystoscopy via bladder noted the bladder to be normal without stone or mass, with normal trigone and ureteral orifices.  The scope was then removed, leaving the wire in place.  An 18-French Councill tip catheter was advanced over the wire and into the bladder.  I could feel the catheter popped into the bladder.  The wire was then removed.  The catheter was hubbed and draining clear urine.  The balloon was inflated and seated at the bladder neck.  The catheter was then completely and freely mobile as a normal catheter given the bladder neck contracture. I performed a rectal exam, and the balloon was palpable in the bladder and was mobile and sitting at the bladder neck normally.  There was no prostate palpable and no masses in the prostatic fossa.  IMPRESSION: 1. Bladder neck contracture. 2. Abnormality of L3 vertebral body with the patient undergoing neuro     surgery.  PLAN:  I would leave the catheter for at least 48 hours or until the patient is ambulatory, whichever is longer.  If the patient needs a Foley longer than 30 days, Urology should be contacted.  He can follow up with Dr. Brunilda Payor in the Outpatient Urology Clinic at [facility]. I discussed my procedure with the patient's wife and son.  We discussed again the nature of scar tissue to recur and that this may just be a temporizing measure.          ______________________________ Jerilee Field, MD     ME/MEDQ  D:  01/05/2011  T:  01/06/2011  Job:  [id number]  Electronically Signed by Jerilee Field MD on 01/10/2011 07:18:17 PM

## 2011-01-10 NOTE — Progress Notes (Signed)
CSW met with pt's wife at bedside.  Pt was asleep during intervention.  CSW provided emotional support to wife and provided opportunity for wife to express concerns and/or questions.  Wife shared she is experiencing difficulties with pt's youngest daughter who, by wife's report, is verbally aggressive towards the wife.  CSW validated the difficulty of the situation and feelings associated with the family dynamics.  CSW encouraged wife to seek individual counseling to assist with difficult feelings and emotions of having a blended family.  CSW also encouraged family counseling to help the family operate in a more healthy and appropriate manner.  Wife stated she would consider counseling and would follow up with CSW at a later time.    Wife asked for clarification on HCPOA.  CSW explained that HCPOA are legal documents utilized when a patient does not have capacity to make decisions. Wife thanked CSW for clarification and stated that she would discuss this with her husband at a more appropriate time.   CSW relayed pertinent information to CSW following patient.

## 2011-01-10 NOTE — Progress Notes (Signed)
This patient was discussed at long LOS rounds this morning. 

## 2011-01-10 NOTE — Progress Notes (Signed)
01/10/2011 Gae Bihl SPARKS [phone]      Pt s/p L2-L4 spinal surgery for pathologic L3 fracture. Spoke to Dr. Arthor Captain (attending) regarding need for PT/OT evaluation post operatively. Will await eval orders from neurosurgery. CM to continue to monitor.

## 2011-01-10 NOTE — Progress Notes (Signed)
Subjective: Patient reports pain located in lower back; improved from yesterday  Objective: Vital signs in last 24 hours: Temp:  [97.6 F (36.4 C)-100.9 F (38.3 C)] 100.9 F (38.3 C) (11/07 1400) Pulse Rate:  [65-87] 87  (11/07 1400) Resp:  [15-20] 18  (11/07 1400) BP: (99-129)/(56-70) 109/70 mmHg (11/07 1400) SpO2:  [90 %-99 %] 90 % (11/07 1400)  Intake/Output from previous day: 11/06 0701 - 11/07 0700 In: 2480 [I.V.:2480] Out: 2875 [Urine:2800; Blood:75] Intake/Output this shift: Total I/O In: 360 [P.O.:360] Out: -   Neurologic: Mental status: Alert, oriented, thought content appropriate Cranial nerves: normal Motor: Normal  Lab Results:  Basename 01/10/11 0640 01/09/11 0355  WBC 12.6* 10.8*  HGB 9.3* 9.1*  HCT 27.1* 27.3*  PLT 251 221   BMET  Basename 01/10/11 0640 01/08/11 0905  NA 136 139  K 4.1 3.6  CL 104 106  CO2 24 26  GLUCOSE 110* 97  BUN 10 11  CREATININE 0.77 0.73  CALCIUM 8.4 8.2*    Studies/Results: Dg Lumbar Spine 2-3 Views  01/09/2011  *RADIOLOGY REPORT*  Clinical Data: Lumbar fusion L2-L4.  Pathologic fracture L3.  LUMBAR SPINE - 2-3 VIEW  Comparison: 01/02/2011 MR.  01/09/2011 intraoperative lateral view.  Findings: Three intraoperative C-arm views submitted for review after surgery.  Interbody spacer has been placed between what appears to be the inferior endplate of L2 and the superior endplate of L4 traversing through the pathologically compressed L3 vertebra.  Pedicle screws L2 and L4 with posterior connecting bar.  This can be assessed on follow-up.  IMPRESSION: Stabilization L2-L4 as described above.  Original Report Authenticated By: Fuller Canada, M.D.   Dg Lumbar Spine 2-3 Views  01/09/2011  *RADIOLOGY REPORT*  Clinical Data: L3 pathologic fracture.  L2-L4 lumbar fusion.  LUMBAR SPINE - 2-3 VIEW  Comparison: Intraoperative views same date.  Lumbar MRI 01/02/2011.  Findings: Cross-table lateral view labeled #1 at 1846 hours  demonstrates unchanged hardware at L3 status post partial corpectomy and fusion.  There is a posterior localizing needle between the L3 and L4 spinous processes.  Image #2 of 1901 hours demonstrates skin spreaders posteriorly at L3.  There is a blunt surgical instrument overlapping the L4-L5 facet joint.  IMPRESSION: Intraoperative localization as described.  Original Report Authenticated By: Gerrianne Scale, M.D.   Dg C-arm 61-120 Min  01/09/2011  CLINICAL DATA: Lumbar Intraop fusion   C-ARM 61-120 MINUTES  Fluoroscopy was utilized by the requesting physician.  No radiographic  interpretation.      Assessment/Plan: Doing well. Will start physical therapy. Brace has been  Ordered.  LOS: 10 days     Rodney [name] 01/10/2011, 3:43 PM

## 2011-01-10 NOTE — Plan of Care (Signed)
Problem: Phase II Progression Outcomes Goal: PT/OT consults completed OOB as tolerated with brace; new order received per Dr. Charlynn Court, [name] 01/10/11 @ 1600

## 2011-01-10 NOTE — Progress Notes (Signed)
DAILY PROGRESS NOTE                              GENERAL INTERNAL MEDICINE [facility]  SUBJECTIVE: Patient has severe he mentioned that the oral pain medications is not controlling his pain  OBJECTIVE: BP 100/65  Pulse 79  Temp(Src) 98.3 F (36.8 C) (Oral)  Resp 20  Ht 5\' 8"  (1.727 m)  Wt 98.9 kg (218 lb 0.6 oz)  BMI 33.15 kg/m2  SpO2 95%  Intake/Output Summary (Last 24 hours) at 01/10/11 1252 Last data filed at 01/10/11 0900  Gross per 24 hour  Intake   2520 ml  Output   2325 ml  Net    195 ml                      Weight change:  Physical Exam: General: Alert and awake oriented x3 not in any acute distress. HEENT: anicteric sclera, pupils equal reactive to light and accommodation CVS: S1-S2 heard, no murmur rubs or gallops Chest: clear to auscultation bilaterally, no wheezing rales or rhonchi Abdomen:  normal bowel sounds, soft, nontender, nondistended, no organomegaly Neuro: Cranial nerves II-XII intact, no focal neurological deficits Extremities: no cyanosis, no clubbing or edema noted bilaterally   Lab Results:  [facility] 01/10/11 0640 01/08/11 0905  NA 136 139  K 4.1 3.6  CL 104 106  CO2 24 26  GLUCOSE 110* 97  BUN 10 11  CREATININE 0.77 0.73  CALCIUM 8.4 8.2*  MG -- --  PHOS -- --    Basename 01/10/11 0640 01/09/11 0355  WBC 12.6* 10.8*  NEUTROABS -- --  HGB 9.3* 9.1*  HCT 27.1* 27.3*  MCV 89.4 90.4  PLT 251 221    Micro Results: Recent Results (from the past 240 hour(s))  MRSA PCR SCREENING     Status: Normal   Collection Time   01/01/11 12:30 AM      Component Value Range Status Comment   MRSA by PCR NEGATIVE  NEGATIVE  Final   SURGICAL PCR SCREEN     Status: Normal   Collection Time   01/05/11  4:55 AM      Component Value Range Status Comment   MRSA, PCR NEGATIVE  NEGATIVE  Final    Staphylococcus aureus NEGATIVE  NEGATIVE  Final     Studies/Results: Dg Lumbar Spine 2-3 Views 01/09/2011  *RADIOLOGY REPORT*  IMPRESSION:  Stabilization L2-L4 as described above.   Dg Lumbar Spine 2-3 Views 01/09/2011  MPRESSION: Intraoperative localization as described.  Original Report Authenticated By: Gerrianne Scale, M.D.   Dg Lumbar Spine 2-3 Views Ordered By Rmm  01/05/2011  *RADIOLOGY REPORT*  Clinical Data: L3 corpectomy, felt to L4 fusion.  LUMBAR SPINE - 2-3 VIEW  Comparison: MRI 01/02/2011  Findings: Two intraoperative spot images demonstrate corpectomy at L3 with placement of a metallic spacer from the inferior L2 to the superior L4 vertebral bodies.  Normal alignment.  IMPRESSION: As above.  Original Report Authenticated By: Cyndie Chime, M.D.   Dg Lumbar Spine Complete 12/31/2010   IMPRESSION: Pathologic fracture of the L3 vertebral body through the level of a chronic L3 lesion which was thought to be benign on the basis of a 2009 MRI. No significant retropulsion of bone.  Does the patient have a known malignancy?  Per CMS PQRS reporting requirements (PQRS Measure 24): Given the patient's age of greater than 50 and the fracture site (  hip, distal radius, or spine), the patient should be tested for osteoporosis using DXA, and the appropriate treatment considered based on the DXA results.    Ct Lumbar Spine Wo Contrast 12/31/2010  **ADDENDUM** CREATED: 12/31/2010 19:17:49  Per CMS PQRS reporting requirements (PQRS Measure 24): Given the patient's age of greater than 50 and the fracture site (hip, distal radius, or spine), the patient should be tested for osteoporosis using DXA, and the appropriate treatment considered based on the DXA results.  **END ADDENDUM** SIGNED BY: Dineen Kid. Chestine Spore, M.D.   12/31/2010  * IMPRESSION: Pathologic fracture of L3.  There is a lytic lesions throughout the L3 vertebral body compatible with multiple myeloma. There is retropulsion of tumor and bone into the canal causing moderately severe spinal stenosis.  Large lytic lesion in the sacrum on the left compatible with myeloma.  Lumbar disc  degeneration and facet degeneration as above.   Mr Lumbar Spine W Wo Contrast 01/02/2011 IMPRESSION: When compared to the prior 2009 MR, there is been marked progression of abnormality involving the L3 vertebral body now with diffuse replacement of bone marrow with pathologic fracture with 25% loss of height.  Retropulsion of tumor posterior central aspect of the compressed vertebra greater to the left of midline contributes to mild impression upon the thecal sac greater to left midline and mass effect upon the origin of the left L4 nerve root.  Destructive lesion left aspect of the sacrum is incompletely assessed on the present exam.  Tumor extends towards the upper sacral neural foramen.  Small areas of altered signal intensity/enhancement involving T12 and L1.  Close attention to these areas to determine if this represents tumor versus degenerative changes.  Sagittal images raise possibility subtle enhancement of the conus.  Degenerative changes as detailed above.    Dg Chest Portable 1 View 01/08/2011  *RADIOLOGY REPORT* IMPRESSION: Persistent linear opacities at the lung bases most consistent with atelectasis.  Stable cardiomegaly.    Dg Chest Portable 1 View 01/06/2011  *RADIOLOGY REPORT*   IMPRESSION: Low volume film with left base atelectasis.   Dg Bone Survey Met 01/01/2011  *RADIOLOGY REPORT*   IMPRESSION: Pathologic fracture of L3.  No other fractures.  Lytic lesion left sacrum, better seen on the recent CT.  This may be myeloma.  Three small lucencies in the calvarium which could be due to myeloma however are nonspecific.      Medications: Scheduled Meds:    . chlorhexidine  15 mL Mouth/Throat BID  . docusate  100 mg Per Tube BID  . oxyCODONE  10 mg Oral Q12H  . pantoprazole  40 mg Oral Q1200  . polyethylene glycol  17 g Oral Daily  . DISCONTD: bupivacaine liposome  20 mL Infiltration Once   Continuous Infusions:    . sodium chloride 0.9 % 1,000 mL with potassium chloride 20  mEq infusion 10 mL/hr at 01/10/11 1057   PRN Meds:.acetaminophen, acetaminophen, albuterol, bisacodyl, calcium carbonate, diazepam, diazepam, hydroxypropyl methylcellulose, morphine injection, oxyCODONE, phenol, promethazine, senna, DISCONTD: bupivacaine liposome, DISCONTD: HYDROmorphone, DISCONTD: lidocaine-EPINEPHrine, DISCONTD: meperidine, DISCONTD:  morphine injection, DISCONTD:  morphine injection, DISCONTD:  morphine injection, DISCONTD: promethazine DISCONTD: Surgifoam 100 with Thrombin 20,000 units (20 ml) topical solution, DISCONTD: thrombin  ASSESSMENT & PLAN: Principal Problem:  *Pathologic fracture of vertebra Active Problems:  Metastatic multiple myeloma to bone  PULMONARY EMBOLISM, HX OF  1. Pathologic fracture vertebra: Patient admitted to the hospital because of followup the back pain. Patient does have lytic lesions lesions for some time  now. Which resulted as a diagnosis of multiple myeloma. Patient was seen by Dr. Mammie Russian from neurosurgery. 2 procedures was done up to date. The first procedure was done on November 3. Anterolateral approach for L3 Vertebrectomy it anterolateral arthrodesis. Second procedure yesterday which is posterolateral arthrodesis. Patient is doing fine and is complaining about a lot of pain but he is moving his legs.  Awaiting for a neurosurgery to order physical therapy to determine the disposition.  2. Bony lytic lesions: This is been going on for a long time as mentioned above patient's phone with Dr. Arline Asp, and being treated presumptively for multiple myeloma.  3. History of pulmonary embolism: The patient was on Coumadin that's on hold for now because of the neurosurgery. Patient to be restarted on Coumadin whenever it's okay with neurosurgery.  4. Anemia: Patient probably has chronic anemia from his multiple myeloma exacerbated by intra-operative blood loss. We'll monitor closely transfuse if hemoglobin less than 7.   LOS: 10 days    Tymir Terral A 01/10/2011, 12:52 PM

## 2011-01-11 ENCOUNTER — Telehealth: Payer: Self-pay | Admitting: *Deleted

## 2011-01-11 LAB — URINALYSIS, ROUTINE W REFLEX MICROSCOPIC
Glucose, UA: NEGATIVE mg/dL
Nitrite: NEGATIVE
Specific Gravity, Urine: 1.027 (ref 1.005–1.030)
pH: 5.5 (ref 5.0–8.0)

## 2011-01-11 LAB — URINE MICROSCOPIC-ADD ON

## 2011-01-11 MED ORDER — HEPARIN (PORCINE) IN NACL 100-0.45 UNIT/ML-% IJ SOLN
2100.0000 [IU]/h | INTRAMUSCULAR | Status: DC
  Administered 2011-01-12: 2100 [IU]/h via INTRAVENOUS
  Filled 2011-01-11 (×2): qty 250

## 2011-01-11 NOTE — Progress Notes (Signed)
Physical Therapy Evaluation Patient Details Name: Rodney [name] MRN: [id number] DOB: 1940-10-24 Today's Date: 01/11/2011  Problem List:  Patient Active Problem List  Diagnoses  . ESOPHAGEAL REFLUX  . ACUT DUOD ULCER W/O MENTION HEMORR/PERF W/OBST  . INGUINAL HERNIA  . PANCREATIC STEATORRHEA  . Metastatic multiple myeloma to bone  . PROSTATE CANCER, HX OF  . PULMONARY EMBOLISM, HX OF  . PANCREATITIS, HX OF  . GASTRITIS, ACUTE  . Clotting disorder  . Pathologic fracture of vertebra    Past Medical History:  Past Medical History  Diagnosis Date  . Pulmonary embolism   . Prostate cancer   . Pancreatitis   . Multiple myeloma   . Pulmonary embolism   . Degenerative disc disease    Past Surgical History:  Past Surgical History  Procedure Date  . Cholecystectomy 04/06/2010  . Back surgery     PT Assessment/Plan/Recommendation PT Assessment Clinical Impression Statement: Patient presents with significant weakness secondary to prolonged bed rest post surgery. Patient will benefit form PT to address strength issues and maximize independence with mobilty to decrease burden of care at discharge PT Recommendation/Assessment: Patient will need skilled PT in the acute care venue PT Problem List: Decreased strength;Decreased activity tolerance;Decreased balance;Decreased mobility;Decreased knowledge of use of DME;Decreased knowledge of precautions;Cardiopulmonary status limiting activity PT Therapy Diagnosis : Difficulty walking;Generalized weakness;Acute pain PT Plan PT Frequency: Min 5X/week PT Treatment/Interventions: DME instruction;Gait training;Stair training;Functional mobility training;Patient/family education PT Recommendation Recommendations for Other Services: Rehab consult Follow Up Recommendations: Inpatient Rehab Equipment Recommended: Defer to next venue PT Goals  Acute Rehab PT Goals PT Goal Formulation: With patient Pt will Roll Supine to Left Side: with  supervision Pt will go Supine/Side to Sit: with supervision;with HOB 0 degrees Pt will go Sit to Supine/Side: with supervision;with HOB 0 degrees Pt will Transfer Sit to Stand/Stand to Sit: with supervision;with upper extremity assist Pt will Transfer Bed to Chair/Chair to Bed: with supervision Pt will Ambulate: >150 feet;with supervision;with rolling walker Additional Goals Additional Goal #1: Patient will recall all back precautions and demonstrate adherence to in functional activity  PT Evaluation Precautions/Restrictions  Precautions Precautions: Back Precaution Booklet Issued: Yes (comment) Required Braces or Orthoses: Yes Spinal Brace: Lumbar corset;Applied in standing position Prior Functioning  Home Living Lives With: Spouse Receives Help From: Family Type of Home: Apartment Home Layout: One level (Apartment on second level) Home Access: Stairs to enter Entrance Stairs-Rails: Lawyer of Steps: 1 flight - approx 10 Home Adaptive Equipment: None Prior Function Level of Independence: Independent with homemaking with ambulation Driving: Yes Vocation: 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, Civil Service Overall Cognitive Status: Appears within functional limits for tasks assessed Orientation Level: Oriented X4 Sensation/Coordination Sensation Light Touch: Appears Intact Extremity Assessment RLE Assessment RLE Assessment: Exceptions to [facility] RLE Strength RLE Overall Strength: Deficits (Weakness noted grossly 3/5) LLE Assessment LLE Assessment: Exceptions to [facility] LLE Strength LLE Overall Strength: Deficits (Weakness noted - grossly 3/5) Mobility (including Balance) Bed Mobility Bed Mobility: Yes Rolling Left: 3: Mod assist Rolling Left Details (indicate cue type and reason): With education of log roll technique - increased time to complete task - patient lethargic and generally slow to process Left  Sidelying to Sit: 3: Mod assist Left Sidelying to Sit Details (indicate cue type and reason): Education on sequencing of lwer extremities and trunk Sitting - Scoot to Edge of Bed: 3: Mod assist Sitting - Scoot to Edge of Bed Details (indicate cue type and reason): Patient  with difficulty initiating Sit to Supine - Left: 2: Max assist Sit to Supine - Left Details (indicate cue type and reason): To ensure correct sequencing of lower extreimites and trunk - patient with difficulty raising lower extremities Transfers Transfers: Yes Sit to Stand: 2: Max assist;With upper extremity assist Sit to Stand Details (indicate cue type and reason): Education on hand placement to ensure safety. Difficulty extending knees and flexed posture - max cues to correct Stand to Sit: 4: Min assist Stand to Sit Details: To control descent - premature sit and not time to release rolling walker Stand Pivot Transfers: 2: Max assist Stand Pivot Transfer Details (indicate cue type and reason): Pivot back to bed secondary to low BP -  See vital signs for details (73/44) Ambulation/Gait Ambulation/Gait: Yes Ambulation/Gait Assistance: 2: Max assist Ambulation/Gait Assistance Details (indicate cue type and reason): With rolling walker - pivotal steps to chair with max cueing. Difficulty advancing lower extremities. Cues for posture Ambulation Distance (Feet): 2 Feet Assistive device: Rolling walker Gait Pattern: Shuffle;Trunk flexed (knees flexed)    End of Session PT - End of Session Equipment Utilized During Treatment: Back brace Activity Tolerance: Patient limited by pain (Limited by drop in BP with transfer to chair) Patient left: in bed;with call bell in reach;with family/visitor present Nurse Communication: Mobility status for transfers General Behavior During Session: Lethargic  [name] 01/11/2011, 12:09 PM

## 2011-01-11 NOTE — Progress Notes (Signed)
Utilization review completed. Geraline Halberstadt Watson 01/11/2011 

## 2011-01-11 NOTE — Progress Notes (Signed)
DAILY PROGRESS NOTE                              [facility]  SUBJECTIVE:  Patient's pain is improving. He rated it as a 3/10 for now. Patient did have low-grade temperature of 100.9. He did have a Foley catheter since his first surgery. PT/OT ordered for today  OBJECTIVE: BP 105/56  Pulse 80  Temp(Src) 98.1 F (36.7 C) (Oral)  Resp 20  Ht 5\' 8"  (1.727 m)  Wt 98.9 kg (218 lb 0.6 oz)  BMI 33.15 kg/m2  SpO2 94%  Intake/Output Summary (Last 24 hours) at 01/11/11 0755 Last data filed at 01/11/11 0700  Gross per 24 hour  Intake   1020 ml  Output    950 ml  Net     70 ml                      Weight change:  Physical Exam: General: Alert and awake oriented x3 not in any acute distress. HEENT: anicteric sclera, pupils equal reactive to light and accommodation CVS: S1-S2 heard, no murmur rubs or gallops Chest: clear to auscultation bilaterally, no wheezing rales or rhonchi Abdomen:  normal bowel sounds, soft, nontender, nondistended, no organomegaly Neuro: Cranial nerves II-XII intact, no focal neurological deficits Extremities: no cyanosis, no clubbing or edema noted bilaterally   Lab Results:  [facility] 01/10/11 0640 01/08/11 0905  NA 136 139  K 4.1 3.6  CL 104 106  CO2 24 26  GLUCOSE 110* 97  BUN 10 11  CREATININE 0.77 0.73  CALCIUM 8.4 8.2*  MG -- --  PHOS -- --    Basename 01/10/11 0640 01/09/11 0355  WBC 12.6* 10.8*  NEUTROABS -- --  HGB 9.3* 9.1*  HCT 27.1* 27.3*  MCV 89.4 90.4  PLT 251 221    Micro Results: Recent Results (from the past 240 hour(s))  SURGICAL PCR SCREEN     Status: Normal   Collection Time   01/05/11  4:55 AM      Component Value Range Status Comment   MRSA, PCR NEGATIVE  NEGATIVE  Final    Staphylococcus aureus NEGATIVE  NEGATIVE  Final     Studies/Results: Dg Lumbar Spine 2-3 Views 01/09/2011  *RADIOLOGY REPORT*  IMPRESSION: Stabilization L2-L4 as described above.   Dg Lumbar Spine 2-3 Views  01/09/2011  MPRESSION: Intraoperative localization as described.  Original Report Authenticated By: Gerrianne Scale, M.D.   Dg Lumbar Spine 2-3 Views Ordered By Rmm  01/05/2011  *RADIOLOGY REPORT*  Clinical Data: L3 corpectomy, felt to L4 fusion.  LUMBAR SPINE - 2-3 VIEW  Comparison: MRI 01/02/2011  Findings: Two intraoperative spot images demonstrate corpectomy at L3 with placement of a metallic spacer from the inferior L2 to the superior L4 vertebral bodies.  Normal alignment.  IMPRESSION: As above.  Original Report Authenticated By: Cyndie Chime, M.D.   Dg Lumbar Spine Complete 12/31/2010   IMPRESSION: Pathologic fracture of the L3 vertebral body through the level of a chronic L3 lesion which was thought to be benign on the basis of a 2009 MRI. No significant retropulsion of bone.  Does the patient have a known malignancy?  Per CMS PQRS reporting requirements (PQRS Measure 24): Given the patient's age of greater than 50 and the fracture site (hip, distal radius, or spine), the patient should be tested for osteoporosis using DXA, and the appropriate treatment considered  based on the DXA results.    Ct Lumbar Spine Wo Contrast 12/31/2010  **ADDENDUM** CREATED: 12/31/2010 19:17:49  Per CMS PQRS reporting requirements (PQRS Measure 24): Given the patient's age of greater than 50 and the fracture site (hip, distal radius, or spine), the patient should be tested for osteoporosis using DXA, and the appropriate treatment considered based on the DXA results.  **END ADDENDUM** SIGNED BY: Dineen Kid. Chestine Spore, M.D.   And in Medications: Scheduled Meds:    . chlorhexidine  15 mL Mouth/Throat BID  . docusate  100 mg Per Tube BID  . oxyCODONE  10 mg Oral Q12H  . pantoprazole  40 mg Oral Q1200  . polyethylene glycol  17 g Oral Daily   Continuous Infusions:    . sodium chloride 0.9 % 1,000 mL with potassium chloride 20 mEq infusion 10 mL/hr at 01/10/11 1057   PRN Meds:.acetaminophen, acetaminophen,  albuterol, bisacodyl, calcium carbonate, diazepam, diazepam, hydroxypropyl methylcellulose, morphine injection, oxyCODONE, phenol, promethazine, senna, DISCONTD:  morphine injection  ASSESSMENT & PLAN: Principal Problem:  *Pathologic fracture of vertebra Active Problems:  Metastatic multiple myeloma to bone  PULMONARY EMBOLISM, HX OF  1. Pathologic fracture vertebra: Patient admitted to the hospital because of followup the back pain. Patient does have lytic lesions lesions for some time now. Which resulted as a diagnosis of multiple myeloma. Patient was seen by Dr. Mammie Russian from neurosurgery. 2 procedures was done up to date. The first procedure was done on November 3. Anterolateral approach for L3 Vertebrectomy it anterolateral arthrodesis. Second procedure yesterday which is posterolateral arthrodesis. Patient is doing fine and is complaining about a lot of pain but he is moving his legs.  PT/OT or the development patient.   2. Bony lytic lesions: This is been going on for a long time as mentioned above patient's phone with Dr. Arline Asp, and being treated presumptively for multiple myeloma.  3. History of pulmonary embolism: The patient was on Coumadin that's on hold for now because of the neurosurgery. Patient to be restarted on Coumadin whenever it's okay with neurosurgery.  4. Anemia: Patient probably has chronic anemia from his multiple myeloma exacerbated by intra-operative blood loss. We'll monitor closely transfuse if hemoglobin less than 7.  5. Fever. Patient did have a low-grade fever yesterday of 100.9. He does have Foley catheter since admission we'll obtain a urinalysis and urine culture. I will: Antibiotics for now if patient spikes fever again or the urine cultures come back positive will start on antibiotic. Patient mentioned that his back pain is getting better so it's unlikely to have infected surgical wound site.   LOS: 11 days   Irvan Tiedt A 01/11/2011, 7:55  AM

## 2011-01-11 NOTE — Progress Notes (Signed)
Subjective: Patient reports pain located in the lower back; improved from yesterday  Objective: Vital signs in last 24 hours: Temp:  [98.1 F (36.7 C)-98.9 F (37.2 C)] 98.9 F (37.2 C) (11/08 1400) Pulse Rate:  [79-105] 80  (11/08 1400) Resp:  [18-22] 18  (11/08 1400) BP: (104-120)/(56-71) 114/71 mmHg (11/08 1400) SpO2:  [92 %-99 %] 92 % (11/08 1400)  Intake/Output from previous day: 11/07 0701 - 11/08 0700 In: 1020 [P.O.:900; I.V.:120] Out: 1450 [Urine:1450] Intake/Output this shift:    Neurologic: Mental status: Alert, oriented, thought content appropriate Cranial nerves: II: pupils equal, round, reactive to light and accommodation Motor: Normal  Lab Results:  Basename 01/10/11 0640 01/09/11 0355  WBC 12.6* 10.8*  HGB 9.3* 9.1*  HCT 27.1* 27.3*  PLT 251 221   BMET  Basename 01/10/11 0640  NA 136  K 4.1  CL 104  CO2 24  GLUCOSE 110*  BUN 10  CREATININE 0.77  CALCIUM 8.4    Studies/Results: No results found.  Assessment/Plan: Restart heparin without bolus. Continue with pt. D/C Foley tomorrow. Doing well overall.  LOS: 11 days     Yeni Jiggetts L 01/11/2011, 9:50 PM

## 2011-01-11 NOTE — Plan of Care (Signed)
Problem: Consults Goal: Diagnosis - Spinal Surgery Outcome: Progressing Thoraco/Lumbar Spine Fusion     

## 2011-01-11 NOTE — Progress Notes (Signed)
ANTICOAGULATION CONSULT NOTE - Follow Up Consult  Pharmacy Consult for Heparin Indication: H/o PE  No Known Allergies  Patient Measurements: Height: 5\' 8"  (172.7 cm) (Entered for Cutover) Weight: 218 lb 0.6 oz (98.9 kg) IBW/kg (Calculated) : 68.4  Adjusted Body Weight:   Vital Signs: Temp: 98.9 F (37.2 C) (11/08 1400) Temp src: Oral (11/08 1400) BP: 114/71 mmHg (11/08 1400) Pulse Rate: 80  (11/08 1400)  Labs:  Basename 01/10/11 0640 01/09/11 0355  HGB 9.3* 9.1*  HCT 27.1* 27.3*  PLT 251 221  APTT -- --  LABPROT -- --  INR -- --  HEPARINUNFRC -- --  CREATININE 0.77 --  CKTOTAL -- --  CKMB -- --  TROPONINI -- --   Estimated Creatinine Clearance: 100.8 ml/min (by C-G formula based on Cr of 0.77).   Medications:  Scheduled:    . chlorhexidine  15 mL Mouth/Throat BID  . docusate  100 mg Per Tube BID  . oxyCODONE  10 mg Oral Q12H  . pantoprazole  40 mg Oral Q1200  . polyethylene glycol  17 g Oral Daily   Infusions:    . sodium chloride 0.9 % 1,000 mL with potassium chloride 20 mEq infusion 10 mL/hr at 01/10/11 1057    Assessment: 70 yo who is 48hr s/p back surgery. He has h/o PE. Coumadin had been held. Order to start heparin without bolus. He was therapeutic on 2100 units/hr so we will resume him at that rate. Goal of Therapy:  Heparin level = 0.3-0.5   Plan:  infusion rate 2100 units/hr Check AM heparin level   Ulyses Southward [place] 01/11/2011,10:25 PM

## 2011-01-12 ENCOUNTER — Inpatient Hospital Stay (HOSPITAL_COMMUNITY): Payer: Medicare Other

## 2011-01-12 DIAGNOSIS — C61 Malignant neoplasm of prostate: Secondary | ICD-10-CM

## 2011-01-12 DIAGNOSIS — M8448XA Pathological fracture, other site, initial encounter for fracture: Secondary | ICD-10-CM

## 2011-01-12 LAB — URINE CULTURE

## 2011-01-12 LAB — BASIC METABOLIC PANEL
BUN: 16 mg/dL (ref 6–23)
Chloride: 103 mEq/L (ref 96–112)
GFR calc Af Amer: >90 mL/min (ref >90)
Potassium: 3.6 mEq/L (ref 3.5–5.1)

## 2011-01-12 LAB — PROTIME-INR: Prothrombin Time: 15.8 seconds — ABNORMAL HIGH (ref 11.6–15.2)

## 2011-01-12 MED ORDER — METHOCARBAMOL 100 MG/ML IJ SOLN: 1000.0000 mg | Freq: Two times a day (BID) | INTRAMUSCULAR | Status: DC | PRN

## 2011-01-12 MED ORDER — WARFARIN SODIUM 7.5 MG PO TABS
7.5000 mg | ORAL_TABLET | Freq: Once | ORAL | Status: AC
  Administered 2011-01-12: 7.5 mg via ORAL
  Filled 2011-01-12: qty 1

## 2011-01-12 MED ORDER — LACTULOSE 10 GM/15ML PO SOLN
20.0000 g | Freq: Every day | ORAL | Status: DC | PRN
  Filled 2011-01-12 (×2): qty 30

## 2011-01-12 MED ORDER — TRAZODONE HCL 50 MG PO TABS
50.0000 mg | ORAL_TABLET | Freq: Every evening | ORAL | Status: DC | PRN
  Filled 2011-01-12: qty 1

## 2011-01-12 MED ORDER — HEPARIN (PORCINE) IN NACL 100-0.45 UNIT/ML-% IJ SOLN
2500.0000 [IU]/h | INTRAMUSCULAR | Status: DC
  Administered 2011-01-12 – 2011-01-13 (×2): 2500 [IU]/h via INTRAVENOUS
  Filled 2011-01-12 (×6): qty 250

## 2011-01-12 MED ORDER — MORPHINE SULFATE 2 MG/ML IJ SOLN: 1.0000 mg | Freq: Three times a day (TID) | INTRAMUSCULAR | Status: DC | PRN

## 2011-01-12 NOTE — Consult Note (Signed)
Physical Medicine and Rehabilitation Consult Reason for Consult: Low back pain, L3 pathologic fracture with moderate to severe spinal stenosis. Referring Phsyician:Dr. Susie Cassette.Rodney [name] is an 70 y.o. male who developed acute low back pain with inability to stand on 01/01/11.  Patient with history of prostate cancer, concerns about multiple myeloma with L3 and sacral lesions since 9/06 that had not significantly changed in the past.  CT of spine done revealing L3 pathologic fracture.  Evaluated by Dr. Arline Asp and Dr. Franky Macho.  Patient underwent  L3 corpectomy with vertebrectomy by Dr. Franky Macho on 01/09/11 and was on bedrest for 2 days.  PT evaluation done and patient note to be limited by pain, weakness and decreased balance.  MD, PT recommending CIR  Review of Systems  Constitutional: Positive for malaise/fatigue.  Musculoskeletal: Positive for back pain and joint pain.  All other systems reviewed and are negative.    Past Medical History  Diagnosis Date  . Pulmonary embolism- life long coumadin.   . Prostate cancer   . Pancreatitis   . Multiple myeloma   . Pulmonary embolism   . Degenerative disc disease .Anxiety Wechenbach/Mobitz 1 arrythmia 10th rib sclerotic lesion GERD Elevated TSH     Past Surgical History  Procedure Date  . Cholecystectomy March '12 04/06/2010  . Back surgery 01/09/11   .  Radical prostatectomy March '12  Appendectomy.  Family History  Problem Relation Age of Onset  . Lymphoma Father 46  . Cancer Father   . 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  Quit smoking 25 years ago. He does not have any smokeless tobacco history on file. He reports that he does not drink alcohol or use illicit drugs.  Allergies: No Known Allergies Medications Prior to Admission  Medication Dose Route Frequency Provider Last Rate Last Dose  . acetaminophen  (TYLENOL) suppository 650 mg  650 mg Rectal Q4H PRN Meera Fernand Parkins, PHARMD      . acetaminophen (TYLENOL) tablet 325 mg  325 mg Oral Q4H PRN Annia Belt, PHARMD      . albuterol (PROVENTIL) (5 MG/ML) 0.5% nebulizer solution 2.5 mg  2.5 mg Nebulization Q6H PRN Annia Belt, PHARMD      . bisacodyl (DULCOLAX) suppository 10 mg  10 mg Rectal Daily PRN Annia Belt, PHARMD      . calcium carbonate (TUMS - dosed in mg elemental calcium) chewable tablet 400 mg of elemental calcium  2 tablet Oral Q4H PRN Annia Belt, PHARMD      . chlorhexidine (PERIDEX) 0.12 % solution 15 mL  15 mL Mouth/Throat BID Emeline Gins, PHARMD   15 mL at 01/11/11 2000  . diazepam (VALIUM) tablet 5 mg  5 mg Oral Q6H PRN Emeline Gins, PHARMD   5 mg at 01/11/11 2348  . docusate (COLACE) 50 MG/5ML liquid 100 mg  100 mg Per Tube BID Hilario Quarry Amend, PHARMD   100 mg at 01/11/11 2345  . gadobenate dimeglumine (MULTIHANCE) injection 20 mL  20 mL Intravenous Once Medication Radiologist   20 mL at 01/02/11 1115  . heparin ADULT infusion 100 units/mL (25000 units/250 mL)  2,400 Units/hr Intravenous Continuous Janice Coffin, [facility]      . HYDROmorphone (DILAUDID) injection 1 mg  1 mg Intravenous Once [place], [place]   1 mg at 12/31/10 1751  . HYDROmorphone (DILAUDID)  injection 1 mg  1 mg Intravenous Once [place], [place]   1 mg at 12/31/10 2023  . HYDROmorphone (DILAUDID) injection 2 mg  2 mg Intramuscular Once [place] Masker, [place]   2 mg at 12/31/10 1545  . hydroxypropyl methylcellulose (ISOPTO TEARS) 2.5 % ophthalmic solution 1 drop  1 drop Both Eyes Q12H PRN Ravisankar R Avva, MD      . lactulose (CHRONULAC) 10 GM/15ML solution 20 g  20 g Oral Daily PRN Nayana Abrol      . LORazepam (ATIVAN) 2 MG/ML injection 0.5 mg  0.5 mg Intravenous Once [place], [place]   0.5 mg at 12/31/10 2023  . morphine 2 MG/ML injection 1 mg  1 mg Intravenous TID BM PRN Nayana Abrol      .  ondansetron (ZOFRAN) injection 4 mg  4 mg Intramuscular Once [place], [place]   4 mg at 12/31/10 1545  . oxyCODONE (Oxy IR/ROXICODONE) immediate release tablet 5-10 mg  5-10 mg Oral Q4H PRN Crystal Salomon Fick, PHARMD   5 mg at 01/11/11 2348  . oxyCODONE (OXYCONTIN) 12 hr tablet 10 mg  10 mg Oral Q12H Crystal [place], PHARMD   10 mg at 01/11/11 2000  . pantoprazole (PROTONIX) EC tablet 40 mg  40 mg Oral Q1200 Shan Levans, MD   40 mg at 01/11/11 1200  . phenol (CHLORASEPTIC) mouth spray 1 spray  1 spray Mouth/Throat PRN Terri Arnoldo Morale, PHARMD      . polyethylene glycol (MIRALAX / GLYCOLAX) packet 17 g  17 g Oral Daily Annia Belt, PHARMD   17 g at 01/11/11 1053  . promethazine (PHENERGAN) injection 6.25 mg  6.25 mg Intravenous Q6H PRN Annia Belt, PHARMD   6.25 mg at 01/10/11 0305  . senna (SENOKOT) tablet 17.2 mg  2 tablet Oral Daily PRN Annia Belt, PHARMD      . sodium chloride 0.9 % 1,000 mL with potassium chloride 20 mEq infusion   Intravenous Continuous Shan Levans, MD 10 mL/hr at 01/10/11 1057    . traZODone (DESYREL) tablet 50 mg  50 mg Oral QHS PRN Nayana Abrol      . DISCONTD: antiseptic oral rinse (BIOTENE) solution 15 mL  15 mL Mouth Rinse BID Annia Belt, PHARMD      . DISCONTD: bupivacaine liposome (EXPAREL) 1.3 % injection 266 mg  20 mL Infiltration Once Eugene Garnet, PHARMD      . DISCONTD: bupivacaine liposome (EXPAREL) 1.3 % injection    PRN Kyle L Cabbell   20 mL at 01/09/11 2030  . DISCONTD: dextrose 5 %-0.45 % sodium chloride infusion   Intravenous Continuous Annia Belt, PHARMD      . DISCONTD: diazepam (VALIUM) injection 5 mg  5 mg Intravenous Q6H PRN Otho Bellows, PHARMD      . DISCONTD: diazepam (VALIUM) injection 5 mg  5 mg Intravenous Q6H PRN Emeline Gins, PHARMD   5 mg at 01/07/11 2259  . DISCONTD: diazepam (VALIUM) tablet 5 mg  5 mg Oral Q6H PRN Meera Fernand Parkins, PHARMD        . DISCONTD: diazepam (VALIUM) tablet 5 mg  5 mg Oral Q6H PRN Otho Bellows, PHARMD      . DISCONTD: diphenhydrAMINE (BENADRYL) 12.5 MG/5ML elixir 12.5 mg  12.5 mg Oral Q8H PRN Annia Belt, PHARMD      . DISCONTD: diphenhydrAMINE (BENADRYL) injection 12.5 mg  12.5 mg Intravenous Q8H PRN Annia Belt, PHARMD      .  DISCONTD: docusate sodium (COLACE) capsule 100 mg  100 mg Oral BID Annia Belt, PHARMD      . DISCONTD: fentaNYL (SUBLIMAZE) 10 mcg/mL in sodium chloride 0.9 % 250 mL infusion  50-300 mcg/hr Intravenous Titrated Emeline Gins, PHARMD      . DISCONTD: fentaNYL (SUBLIMAZE) bolus via infusion 50-100 mcg  50-100 mcg Intravenous Q6H PRN Emeline Gins, PHARMD      . DISCONTD: heparin ADULT infusion 100 units/mL (25000 units/250 mL)  2,100 Units/hr Intravenous Continuous [place], [place] 21 mL/hr at 01/12/11 0011 2,100 Units/hr at 01/12/11 0011  . DISCONTD: HYDROcodone-acetaminophen (NORCO) 5-325 MG per tablet 1 tablet  1 tablet Oral Q4H PRN Annia Belt, PHARMD      . DISCONTD: HYDROmorphone (DILAUDID) injection 0.25-0.5 mg  0.25-0.5 mg Intravenous Q5 min PRN W. Autumn Patty, MD      . DISCONTD: HYDROmorphone (DILAUDID) injection 2 mg  2 mg Intravenous Q3H PRN Annia Belt, PHARMD      . DISCONTD: HYDROmorphone (DILAUDID) PCA injection 0.3 mg/mL   Intravenous Q4H Emeline Gins, PHARMD      . DISCONTD: HYDROmorphone (DILAUDID) PCA injection 0.3 mg/mL   Intravenous Q4H Ravisankar R Avva, MD      . DISCONTD: lidocaine-EPINEPHrine (XYLOCAINE W/EPI) 0.5-1:200000 % (with pres) injection    PRN Kyle L Cabbell   20 mL at 01/09/11 1845  . DISCONTD: meperidine (DEMEROL) injection 6.25-12.5 mg  6.25-12.5 mg Intravenous PRN W. Autumn Patty, MD      . DISCONTD: methocarbamol (ROBAXIN) tablet 500 mg  500 mg Oral Q6H Annia Belt, [place]      . DISCONTD: morphine 2 MG/ML injection 1-2 mg  1-2 mg Intravenous  Q4H PRN Mutaz A Elmahi      . DISCONTD: morphine 2 MG/ML injection 2-4 mg  2-4 mg Intravenous Q4H PRN Crystal Salomon Fick, PHARMD      . DISCONTD: morphine 2 MG/ML injection 2-5 mg  2-5 mg Intravenous Q1H PRN Kyle L Cabbell   5 mg at 01/10/11 0007  . DISCONTD: morphine 2 MG/ML injection 2-5 mg  2-5 mg Intravenous Q1H PRN Gary Fleet Abbott, PHARMD      . DISCONTD: morphine 2 MG/ML injection 2-6 mg  2-6 mg Intravenous Q4H PRN Otho Bellows, PHARMD      . DISCONTD: morphine 4 MG/ML injection 2-5 mg  2-5 mg Intravenous Q1H PRN Gary Fleet Abbott, PHARMD   4 mg at 01/10/11 0606  . DISCONTD: naloxone [facility]) injection 0.4 mg  0.4 mg Intravenous PRN Emeline Gins, PHARMD      . DISCONTD: ondansetron [facility]) injection 4 mg  4 mg Intravenous Q6H PRN Annia Belt, PHARMD      . DISCONTD: ondansetron [facility]) injection 4 mg  4 mg Intravenous Q4H PRN Meera Fernand Parkins, PHARMD      . DISCONTD: oxyCODONE-acetaminophen (PERCOCET) 5-325 MG per tablet 1-2 tablet  1-2 tablet Oral Q4H PRN Meera Fernand Parkins, PHARMD      . DISCONTD: pantoprazole (PROTONIX) EC tablet 40 mg  40 mg Oral BID Annia Belt, PHARMD      . DISCONTD: pantoprazole (PROTONIX) injection 40 mg  40 mg Intravenous Q12H Hilario Quarry Amend, PHARMD   40 mg at 01/07/11 1015  . DISCONTD: phenylephrine (NEO-SYNEPHRINE) 10,000 mcg in dextrose 5 % 250 mL infusion  30-200 mcg/min Intravenous Titrated Hilario Quarry Amend, PHARMD      . DISCONTD: phenylephrine (NEO-SYNEPHRINE) 20,000 mcg in dextrose 5 % 250 mL  infusion  30-200 mcg/min Intravenous Titrated Hilario Quarry Amend, [place]      . DISCONTD: promethazine (PHENERGAN) injection 6.25-12.5 mg  6.25-12.5 mg Intravenous Q15 min PRN W. Autumn Patty, MD      . DISCONTD: propofol (DIPRIVAN) 10 MG/ML infusion  5-50 mcg/kg/min Intravenous Titrated Emeline Gins, PHARMD      . DISCONTD: sodium chloride 0.9 % injection 9 mL  9 mL Intravenous  PRN Emeline Gins, PHARMD      . DISCONTD: Surgifoam 100 with Thrombin 20,000 units (20 ml) topical solution    PRN Kyle L Cabbell      . DISCONTD: thrombin spray    PRN Carmela Hurt       Medications Prior to Admission  Medication Sig Dispense Refill  . omeprazole (PRILOSEC) 20 MG capsule Take 20 mg by mouth daily as needed. For acid reflux      . warfarin (COUMADIN) 5 MG tablet Take 1 tablet (5 mg total) by mouth daily.  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 Functional Status:  Mobility: Bed Mobility Bed Mobility: Yes Rolling Left: 3: Mod assist Rolling Left Details (indicate cue type and reason): With education of log roll technique - increased time to complete task - patient lethargic and generally slow to process Left Sidelying to Sit: 3: Mod assist Left Sidelying to Sit Details (indicate cue type and reason): Education on sequencing of lwer extremities and trunk Sitting - Scoot to Edge of Bed: 3: Mod assist Sitting - Scoot to Edge of Bed Details (indicate cue type and reason): Patient with difficulty initiating Sit to Supine - Left: 2: Max assist Sit to Supine - Left Details (indicate cue type and reason): To ensure correct sequencing of lower extreimites and trunk - patient with difficulty raising lower extremities Transfers Transfers: Yes Sit to Stand: 2: Max assist;With upper extremity assist Sit to Stand Details (indicate cue type and reason): Education on hand placement to ensure safety. Difficulty extending knees and flexed  posture - max cues to correct Stand to Sit: 4: Min assist Stand to Sit Details: To control descent - premature sit and not time to release rolling walker Stand Pivot Transfers: 2: Max assist Stand Pivot Transfer Details (indicate cue type and reason): Pivot back to bed secondary to low BP Ambulation/Gait Ambulation/Gait: Yes Ambulation/Gait Assistance: 2: Max assist Ambulation/Gait Assistance Details (indicate cue type and reason): With rolling walker - pivotal steps to chair with max cueing. Difficulty advancing lower extremities. Cues for posture Ambulation Distance (Feet): 2 Feet Assistive device: Rolling walker Gait Pattern: Shuffle;Trunk flexed (knees flexed)    ADL:    Cognition: Cognition Arousal/Alertness: Lethargic Orientation Level: Oriented X4 Cognition Arousal/Alertness: Lethargic Overall Cognitive Status: Appears within functional limits for tasks assessed Orientation Level: Oriented X4  Blood pressure 109/70, pulse 75, temperature 97.6 F (36.4 C), temperature source Oral, resp. rate 19, height 5\' 8"  (1.727 m), weight 98.9 kg (218 lb 0.6 oz), SpO2 93.00%. Physical Exam  Constitutional: He appears lethargic. He appears distressed.  HENT:  Nose: Nose normal.  Eyes: Pupils are equal, round, and reactive to light.  Cardiovascular: Normal  rate.   Pulmonary/Chest: Effort normal.  Abdominal: Soft.  Musculoskeletal:       Right shoulder: He exhibits bony tenderness and pain.  Neurological: He appears lethargic.       No focal motor or sensory deficits although patient was quite hard to arouse this am.  Skin:       Results for orders placed during the hospital encounter of 12/31/10 (from the past 24 hour(s))  URINALYSIS, ROUTINE W REFLEX MICROSCOPIC     Status: Abnormal   Collection Time   01/11/11  5:28 PM      Component Value Range   Color, Urine AMBER (*) YELLOW    Appearance CLEAR  CLEAR    Specific Gravity, Urine 1.027  1.005 - 1.030    pH 5.5  5.0 - 8.0     Glucose, UA NEGATIVE  NEGATIVE (mg/dL)   Hgb urine dipstick MODERATE (*) NEGATIVE    Bilirubin Urine SMALL (*) NEGATIVE    Ketones, ur NEGATIVE  NEGATIVE (mg/dL)   Protein, ur 161 (*) NEGATIVE (mg/dL)   Urobilinogen, UA 2.0 (*) 0.0 - 1.0 (mg/dL)   Nitrite NEGATIVE  NEGATIVE    Leukocytes, UA NEGATIVE  NEGATIVE   URINE MICROSCOPIC-ADD ON     Status: Abnormal   Collection Time   01/11/11  5:28 PM      Component Value Range   WBC, UA 0-2  <3 (WBC/hpf)   RBC / HPF 3-6  <3 (RBC/hpf)   Bacteria, UA FEW (*) RARE    Urine-Other MUCOUS PRESENT    BASIC METABOLIC PANEL     Status: Abnormal   Collection Time   01/12/11  6:20 AM      Component Value Range   Sodium 136  135 - 145 (mEq/L)   Potassium 3.6  3.5 - 5.1 (mEq/L)   Chloride 103  96 - 112 (mEq/L)   CO2 24  19 - 32 (mEq/L)   Glucose, Bld 108 (*) 70 - 99 (mg/dL)   BUN 16  6 - 23 (mg/dL)   Creatinine, Ser 0.96  0.50 - 1.35 (mg/dL)   Calcium 8.7  8.4 - 04.5 (mg/dL)   GFR calc non Af Amer 86 (*) >90 (mL/min)   GFR calc Af Amer >90  >90 (mL/min)  HEPARIN LEVEL     Status: Abnormal   Collection Time   01/12/11  6:20 AM      Component Value Range   Heparin Unfractionated 0.13 (*) 0.30 - 0.70 (IU/mL)  x No results found.  Assessment/Plan: Diagnosis: pathologic L3 vertebral fx s/p corpectomy and vertebrectomy.  Hx of prostate CA 1. Does the need for close, 24 hr/day medical supervision in concert with the patient's rehab needs make it unreasonable for this patient to be served in a less intensive setting? Yes 2. Co-Morbidities requiring supervision/potential complications: cancer rx, hx of PE, pancreatitis, pain mgt 3. Due to bladder management, bowel management, safety, skin/wound care, disease management, medication administration, pain management and patient education, does the patient require 24 hr/day rehab nursing? Yes 4. Does the patient require coordinated care of a physician, rehab nurse, PT (1-2 hrs/day, 5 days/week) and OT (1-2  hrs/day, 5 days/week) to address physical and functional deficits in the context of the above medical diagnosis(es)? Yes Addressing deficits in the following areas: balance, bathing, bowel/bladder control, dressing, endurance, grooming, locomotion, psychosocial adjustment, toileting and transferring 5. Can the patient actively participate in an intensive therapy program of at least 3 hrs of therapy per day at least  5 days per week? Yes and Potentially 6. The potential for patient to make measurable gains while on inpatient rehab is good 7. Anticipated functional outcomes upon discharge from inpatients are modified ind with  PT, min assist to mod ind with OT,  8. Estimated rehab length of stay to reach the above functional goals is: 8-16 days 9. Does the patient have adequate social supports to accommodate these discharge functional goals? Yes 10. Anticipated D/C setting: Home 11. Anticipated post D/C treatments: HH therapy 12. Overall Rehab/Functional Prognosis: good  RECOMMENDATIONS: This patient's condition is appropriate for continued rehabilitative care in the following setting: CIR Patient has agreed to participate in recommended program. Yes Note that insurance prior authorization may be required for reimbursement for recommended care.  Comment: We will follow along for increased activity in fair therapy tolerance. Pain is a major issue at present. Wife is quite supportive. A major obstacle is the set of stairs that the patient needs to navigate to enter his apartment.   Haroldine Laws 01/12/2011

## 2011-01-12 NOTE — Progress Notes (Signed)
ANTICOAGULATION CONSULT NOTE - Initial Consult  Pharmacy Consult for Couamdin Indication: h/o PE  No Known Allergies  Patient Measurements: Height: 5\' 8"  (172.7 cm) (Entered for Cutover) Weight: 218 lb 0.6 oz (98.9 kg) IBW/kg (Calculated) : 68.4    Vital Signs: Temp: 97.6 F (36.4 C) (11/09 0600) Temp src: Oral (11/09 0600) BP: 109/70 mmHg (11/09 0600) Pulse Rate: 75  (11/09 0600)  Labs:  Basename 01/12/11 0620 01/10/11 0640  HGB -- 9.3*  HCT -- 27.1*  PLT -- 251  APTT -- --  LABPROT -- --  INR -- --  HEPARINUNFRC 0.13* --  CREATININE 0.88 0.77  CKTOTAL -- --  CKMB -- --  TROPONINI -- --   Estimated Creatinine Clearance: 91.6 ml/min (by C-G formula based on Cr of 0.88).  Medical History: Past Medical History  Diagnosis Date  . Pulmonary embolism   . Prostate cancer   . Pancreatitis   . Multiple myeloma   . Pulmonary embolism   . Degenerative disc disease     Medications:  Prescriptions prior to admission  Medication Sig Dispense Refill  . acetaminophen (TYLENOL) 500 MG tablet Take 500-1,000 mg by mouth every 6 (six) hours as needed. For pain       . naproxen (NAPROSYN) 500 MG tablet Take 500 mg by mouth 2 (two) times daily with a meal.        . omeprazole (PRILOSEC) 20 MG capsule Take 20 mg by mouth daily as needed. For acid reflux      . oxyCODONE-acetaminophen (PERCOCET) 5-325 MG per tablet Take 0.5 tablets by mouth every 4 (four) hours as needed. For pain        . warfarin (COUMADIN) 5 MG tablet Take 1 tablet (5 mg total) by mouth daily.  30 tablet  6    Assessment: 70 y/o male patient s/p spinal surgery, on chronic coumadin for h/o PE, ok to resume today. Takes 5mg  daily at home. INR subtherapeutic, no coumadin since admit.  Goal of Therapy:  INR 2-3   Plan:  Coumadin 7.5mg  po today, f/u daily protime.  Lovelle Lema M 01/12/2011,9:17 AM

## 2011-01-12 NOTE — Progress Notes (Signed)
Physical Therapy Treatment Patient Details Name: Rodney [name] MRN: [id number] DOB: 1941-01-23 Today's Date: 01/12/2011  PT Assessment/Plan  PT - Assessment/Plan PT Plan: Discharge plan remains appropriate PT Frequency: Min 5X/week Follow Up Recommendations: Inpatient Rehab Equipment Recommended: Defer to next venue PT Goals  Acute Rehab PT Goals PT Goal: Rolling Supine to Left Side - Progress: Progressing toward goal PT Goal: Supine/Side to Sit - Progress: Progressing toward goal PT Transfer Goal: Sit to Stand/Stand to Sit - Progress: Progressing toward goal PT Transfer Goal: Bed to Chair/Chair to Bed - Progress: Progressing toward goal PT Goal: Ambulate - Progress: Progressing toward goal Additional Goals PT Goal: Additional Goal #1 - Progress: Progressing toward goal  PT Treatment Precautions/Restrictions  Precautions Precautions: Back Precaution Booklet Issued: Yes (comment) Precaution Comments: Pt unable to recall any of the back precautions--> reeducated pt on all 3 precautions (BAT) Required Braces or Orthoses: Yes Spinal Brace: Lumbar corset (apply in standing per orders but applied sitting EOB ) Mobility (including Balance) Bed Mobility Rolling Left: 3: Mod assist Rolling Left Details (indicate cue type and reason): cues for sequencing, technique, reinforcement of precautions--> no twisting;  Left Sidelying to Sit: 3: Mod assist Left Sidelying to Sit Details (indicate cue type and reason): cues for sequencing, technique; assistance for LE's and to lift shoulders/trunk to sitting position Sitting - Scoot to Edge of Bed: 4: Min assist Transfers Transfers: Yes Sit to Stand: 3: Mod assist Sit to Stand Details (indicate cue type and reason): cues for hand placement, reinforcement of technique, decrease trunk flexion; assistance to achieve standing, balance, stabilize RW; slow to achieve full hip/knee extension Stand to Sit: 2: Max assist;4: Min assist Stand to Sit  Details: cues for safe hand placement, technique, body position before sitting, no twisting, no bending Ambulation/Gait Ambulation/Gait Assistance: 1: +2 Total assist (+ 2 for safety due to low BP & weakness) Ambulation/Gait Assistance Details (indicate cue type and reason): +2 Total Assis (pt=80%), Cues to increase/maintain upright posture, use of UE's on RW for stability/balance, assistance for balance & management of RW, cues to stay inside RW Ambulation Distance (Feet): 3 Feet (3' + 8') Assistive device: Rolling walker Gait Pattern: Decreased step length - right;Decreased step length - left;Decreased hip/knee flexion - right;Decreased hip/knee flexion - left;Trunk flexed (decreased step height ) Stairs: No Wheelchair Mobility Wheelchair Mobility: No    Exercise    End of Session PT - End of Session Equipment Utilized During Treatment: Gait belt;Back brace Activity Tolerance: Patient limited by pain;Patient limited by fatigue Patient left: in chair;with family/visitor present;with call bell in reach General Behavior During Session: Amador Cunas 01/12/2011, 1:20 PM

## 2011-01-12 NOTE — Progress Notes (Signed)
  ANTICOAGULATION CONSULT NOTE - Follow Up Consult  Pharmacy Consult for Heparin Indication: H/o PE  No Known Allergies  Patient Measurements: Height: 5\' 8"  (172.7 cm) (Entered for Cutover) Weight: 218 lb 0.6 oz (98.9 kg) IBW/kg (Calculated) : 68.4  Adjusted Body Weight:   Vital Signs: Temp: 97.6 F (36.4 C) (11/09 0600) Temp src: Oral (11/09 0600) BP: 109/70 mmHg (11/09 0600) Pulse Rate: 75  (11/09 0600)  Labs:  Basename 01/12/11 0620 01/10/11 0640  HGB -- 9.3*  HCT -- 27.1*  PLT -- 251  APTT -- --  LABPROT -- --  INR -- --  HEPARINUNFRC 0.13* --  CREATININE 0.88 0.77  CKTOTAL -- --  CKMB -- --  TROPONINI -- --   Estimated Creatinine Clearance: 91.6 ml/min (by C-G formula based on Cr of 0.88).   Medications:  Scheduled:     . chlorhexidine  15 mL Mouth/Throat BID  . docusate  100 mg Per Tube BID  . oxyCODONE  10 mg Oral Q12H  . pantoprazole  40 mg Oral Q1200  . polyethylene glycol  17 g Oral Daily   Infusions:     . heparin 2,100 Units/hr (01/12/11 0011)  . sodium chloride 0.9 % 1,000 mL with potassium chloride 20 mEq infusion 10 mL/hr at 01/10/11 1057    Assessment: 70 yo who is 48hr s/p back surgery. Hx of PE previously tx on 2100 units/hr first level today 0.13 will increase heparin  Goal of Therapy:  Heparin level = 0.3-0.5   Plan:  Increase heparin to 2400 units/hr and recheck in 6 hours Check AM heparin level   Janice Coffin 01/12/2011,7:53 AM

## 2011-01-12 NOTE — Progress Notes (Signed)
ANTICOAGULATION CONSULT NOTE - Follow Up Consult  Pharmacy Consult for heparin Indication: hx of PE  No Known Allergies  Patient Measurements: Height: 5\' 8"  (172.7 cm) (Entered for Cutover) Weight: 218 lb 0.6 oz (98.9 kg) IBW/kg (Calculated) : 68.4  Adjusted Body Weight: 89.5 kg  Vital Signs: Temp: 98.6 F (37 C) (11/09 1448) Temp src: Oral (11/09 1448) BP: 91/59 mmHg (11/09 1448) Pulse Rate: 69  (11/09 1448)  Labs:  Basename 01/12/11 1601 01/12/11 0620 01/10/11 0640  HGB -- -- 9.3*  HCT -- -- 27.1*  PLT -- -- 251  APTT -- -- --  LABPROT -- -- --  INR -- -- --  HEPARINUNFRC 0.27* 0.13* --  CREATININE -- 0.88 0.77  CKTOTAL -- -- --  CKMB -- -- --  TROPONINI -- -- --   Estimated Creatinine Clearance: 91.6 ml/min (by C-G formula based on Cr of 0.88).   Medications:  Scheduled:    . chlorhexidine  15 mL Mouth/Throat BID  . docusate  100 mg Per Tube BID  . oxyCODONE  10 mg Oral Q12H  . pantoprazole  40 mg Oral Q1200  . polyethylene glycol  17 g Oral Daily  . warfarin  7.5 mg Oral ONCE-1800    Assessment: 70 yo male with hx of PE is currently subtherapeutic heparin.  Goal of Therapy:  heparin level 0.3-0.5   Plan:  1) Increase heparin drip to 2500 units/hr 2) Check a 6hr HL after rate is changed.  Manuel Dall, Tsz-Yin 01/12/2011,4:54 PM

## 2011-01-12 NOTE — Progress Notes (Signed)
Subjective: The patient complains of low back pain, lack of appetite, inability to sleep, the family has been concerned about some confusion during the day, which attribute to his narcotic medications as well as Valium.  Objective: Vital signs in last 24 hours: Filed Vitals:   01/11/11 1155 01/11/11 1400 01/11/11 2100 01/12/11 0600  BP:  114/71 120/42 109/70  Pulse: 88 80 84 75  Temp:  98.9 F (37.2 C) 97.5 F (36.4 C) 97.6 F (36.4 C)  TempSrc:  Oral Oral Oral  Resp:  18 20 19   Height:      Weight:      SpO2: 99% 92% 92% 93%    Intake/Output Summary (Last 24 hours) at 01/12/11 0844 Last data filed at 01/12/11 0600  Gross per 24 hour  Intake    240 ml  Output   1600 ml  Net  -1360 ml    Weight change:  General: Alert, awake, oriented x3, in no acute distress. HEENT: No bruits, no goiter. Heart: Regular rate and rhythm, without murmurs, rubs, gallops. Lungs: Clear to auscultation bilaterally. Abdomen: Soft, nontender, nondistended, positive bowel sounds. Extremities: No clubbing cyanosis or edema with positive pedal pulses. Neuro: Grossly intact, nonfocal.   } Lab Results: Results for orders placed during the hospital encounter of 12/31/10 (from the past 24 hour(s))  URINALYSIS, ROUTINE W REFLEX MICROSCOPIC     Status: Abnormal   Collection Time   01/11/11  5:28 PM      Component Value Range   Color, Urine AMBER (*) YELLOW    Appearance CLEAR  CLEAR    Specific Gravity, Urine 1.027  1.005 - 1.030    pH 5.5  5.0 - 8.0    Glucose, UA NEGATIVE  NEGATIVE (mg/dL)   Hgb urine dipstick MODERATE (*) NEGATIVE    Bilirubin Urine SMALL (*) NEGATIVE    Ketones, ur NEGATIVE  NEGATIVE (mg/dL)   Protein, ur 161 (*) NEGATIVE (mg/dL)   Urobilinogen, UA 2.0 (*) 0.0 - 1.0 (mg/dL)   Nitrite NEGATIVE  NEGATIVE    Leukocytes, UA NEGATIVE  NEGATIVE   URINE MICROSCOPIC-ADD ON     Status: Abnormal   Collection Time   01/11/11  5:28 PM      Component Value Range   WBC, UA 0-2  <3  (WBC/hpf)   RBC / HPF 3-6  <3 (RBC/hpf)   Bacteria, UA FEW (*) RARE    Urine-Other MUCOUS PRESENT    BASIC METABOLIC PANEL     Status: Abnormal   Collection Time   01/12/11  6:20 AM      Component Value Range   Sodium 136  135 - 145 (mEq/L)   Potassium 3.6  3.5 - 5.1 (mEq/L)   Chloride 103  96 - 112 (mEq/L)   CO2 24  19 - 32 (mEq/L)   Glucose, Bld 108 (*) 70 - 99 (mg/dL)   BUN 16  6 - 23 (mg/dL)   Creatinine, Ser 0.96  0.50 - 1.35 (mg/dL)   Calcium 8.7  8.4 - 04.5 (mg/dL)   GFR calc non Af Amer 86 (*) >90 (mL/min)   GFR calc Af Amer >90  >90 (mL/min)  HEPARIN LEVEL     Status: Abnormal   Collection Time   01/12/11  6:20 AM      Component Value Range   Heparin Unfractionated 0.13 (*) 0.30 - 0.70 (IU/mL)     Micro: Recent Results (from the past 240 hour(s))  SURGICAL PCR SCREEN     Status:  Normal   Collection Time   01/05/11  4:55 AM      Component Value Range Status Comment   MRSA, PCR NEGATIVE  NEGATIVE  Final    Staphylococcus aureus NEGATIVE  NEGATIVE  Final     Studies/Results: No results found.  Medications:    . chlorhexidine  15 mL Mouth/Throat BID  . docusate  100 mg Per Tube BID  . oxyCODONE  10 mg Oral Q12H  . pantoprazole  40 mg Oral Q1200  . polyethylene glycol  17 g Oral Daily     Assessment: Principal Problem:  *Pathologic fracture of vertebra Active Problems:  Metastatic multiple myeloma to bone  PULMONARY EMBOLISM, HX OF 1. Pathologic fracture vertebra: Patient admitted to the hospital because of followup the back pain. Patient does have lytic lesions lesions for some time now. Which resulted as a diagnosis of multiple myeloma. Patient was seen by Dr. Mammie Russian from neurosurgery. 2 procedures was done up to date. The first procedure was done on November 3. Anterolateral approach for L3 Vertebrectomy it anterolateral arthrodesis. Second procedure yesterday which is posterolateral arthrodesis. Patient is doing fine and is complaining about a lot of  pain but he is moving his legs. We will get inpatient rehabilitation consultation. We will minimize his Valium and his IV morphine and due to sedation. PT/OT or the development patient.  2. Bony lytic lesions: This is been going on for a long time as mentioned above patient's phone with Dr. Arline Asp, and being treated presumptively for multiple myeloma. His hemoglobin is stable 3. History of pulmonary embolism: The patient was on Coumadin that's on hold for now because of the neurosurgery. Patient to be restarted on Coumadin whenever it's okay with neurosurgery. We will transition him to by mouth Coumadin and monitor INR and CBC closely 4. Anemia: Patient probably has chronic anemia from his multiple myeloma exacerbated by intra-operative blood loss. We'll monitor closely transfuse if hemoglobin less than 7.  5. Fever. Patient did have a low-grade fever yesterday of 100.9. He does have  I will: Antibiotics for now if patient spikes fever again or the urine cultures come back positive will start on antibiotic. Patient mentioned that his back pain is getting better so it's unlikely to have infected surgical wound site. We will obtain a chest x-ray. We will discontinue his Foley catheter today.      Plan:    LOS: 12 days   Rodney [name] 01/12/2011, 8:44 AM

## 2011-01-12 NOTE — Progress Notes (Signed)
Clinical Social Worker reviewed chart and pt was evaluated by CIR. Per chart, pt appropriate for CIR. Clinical Social Worker signing off at this time. Please re-consult if further needs arise.  Jacklynn Lewis, MSW, LCSWA  Clinical Social Work [phone]

## 2011-01-12 NOTE — Progress Notes (Addendum)
Subjective: Patient reports pain located lower back; improved from yesterday and tolerating PO  Objective: Vital signs in last 24 hours: Temp:  [97.5 F (36.4 C)-98.6 F (37 C)] 97.9 F (36.6 C) (11/09 1840) Pulse Rate:  [69-84] 75  (11/09 1840) Resp:  [18-20] 20  (11/09 1840) BP: (91-120)/(42-70) 99/62 mmHg (11/09 1840) SpO2:  [92 %-94 %] 92 % (11/09 1840)  Intake/Output from previous day: 11/08 0701 - 11/09 0700 In: 240 [P.O.:240] Out: 1600 [Urine:1600] Intake/Output this shift:    Neurologic: Mental status: Alert, oriented, thought content appropriate Motor: 5/5 lower extremities Wound: clean, dry, no signs infection  Lab Results:  [facility] 01/10/11 0640  WBC 12.6*  HGB 9.3*  HCT 27.1*  PLT 251   BMET  Basename 01/12/11 0620 01/10/11 0640  NA 136 136  K 3.6 4.1  CL 103 104  CO2 24 24  GLUCOSE 108* 110*  BUN 16 10  CREATININE 0.88 0.77  CALCIUM 8.7 8.4    Studies/Results: Dg Chest 1 View  01/12/2011  *RADIOLOGY REPORT*  Clinical Data: Rule out pneumonia  CHEST - 1 VIEW  Comparison: 01/08/2011  Findings: Cardiomediastinal silhouette is stable.  Mild elevation of the right hemidiaphragm with right basilar atelectasis.  Stable linear atelectasis or scarring in the lingula.  No acute infiltrate or convincing edema.  IMPRESSION:  Mild elevation of the right hemidiaphragm with right basilar atelectasis.  Stable linear atelectasis or scarring in the lingula. No acute infiltrate or convincing edema.  Original Report Authenticated By: Natasha Mead, M.D.    Assessment/Plan: Not moving much. Continue to encourage ambulation Will watch wbc carefully. Hct is stable. Patient is now on heparin IV. Ok for primary service to start coumadin at any time.  LOS: 12 days     Bexley Laubach L 01/12/2011, 7:12 PM

## 2011-01-13 DIAGNOSIS — J9600 Acute respiratory failure, unspecified whether with hypoxia or hypercapnia: Secondary | ICD-10-CM

## 2011-01-13 DIAGNOSIS — I469 Cardiac arrest, cause unspecified: Secondary | ICD-10-CM

## 2011-01-13 LAB — CBC
Hemoglobin: 7.8 g/dL — ABNORMAL LOW (ref 13.0–17.0)
MCH: 29.9 pg (ref 26.0–34.0)
Platelets: 313 10*3/uL (ref 150–400)
RBC: 2.61 MIL/uL — ABNORMAL LOW (ref 4.22–5.81)
RBC: 2.88 MIL/uL — ABNORMAL LOW (ref 4.22–5.81)
WBC: 12.2 10*3/uL — ABNORMAL HIGH (ref 4.0–10.5)

## 2011-01-13 LAB — PROTIME-INR
INR: 1.18 (ref 0.00–1.49)
Prothrombin Time: 15.2 seconds (ref 11.6–15.2)

## 2011-01-13 LAB — TYPE AND SCREEN
ABO/RH(D): B POS
Antibody Screen: NEGATIVE

## 2011-01-13 MED ORDER — BISACODYL 10 MG RE SUPP
10.0000 mg | Freq: Two times a day (BID) | RECTAL | Status: DC
  Administered 2011-01-14 (×2): 10 mg via RECTAL
  Filled 2011-01-13 (×3): qty 1

## 2011-01-13 MED ORDER — METHOCARBAMOL 500 MG PO TABS
500.0000 mg | ORAL_TABLET | Freq: Three times a day (TID) | ORAL | Status: DC | PRN
  Administered 2011-01-13 – 2011-01-15 (×2): 500 mg via ORAL
  Filled 2011-01-13 (×2): qty 1

## 2011-01-13 MED ORDER — WARFARIN SODIUM 7.5 MG PO TABS
7.5000 mg | ORAL_TABLET | Freq: Once | ORAL | Status: AC
  Administered 2011-01-13: 7.5 mg via ORAL
  Filled 2011-01-13: qty 1

## 2011-01-13 MED ORDER — ENOXAPARIN SODIUM 40 MG/0.4ML ~~LOC~~ SOLN
40.0000 mg | SUBCUTANEOUS | Status: DC
  Administered 2011-01-13 – 2011-01-14 (×2): 40 mg via SUBCUTANEOUS
  Filled 2011-01-13 (×3): qty 0.4

## 2011-01-13 NOTE — Progress Notes (Signed)
  Doing well. C/o appropriate incisional soreness.  No Numbness, tingling, weakness No Nausea /vomiting Ambulating  Temp:  [97.3 F (36.3 C)-98.6 F (37 C)] 97.3 F (36.3 C) (11/10 0700) Pulse Rate:  [67-75] 69  (11/10 0700) Resp:  [18-20] 20  (11/10 0700) BP: (91-108)/(59-70) 108/70 mmHg (11/10 0700) SpO2:  [92 %-95 %] 94 % (11/10 0700) stable strength and sensation Incision CDI  Plan: CPM

## 2011-01-13 NOTE — Progress Notes (Signed)
ANTICOAGULATION CONSULT NOTE - Follow Up Consult  Pharmacy Consult for heparin Indication: hx of PE  No Known Allergies  Patient Measurements: Height: 5\' 8"  (172.7 cm) (Entered for Cutover) Weight: 218 lb 0.6 oz (98.9 kg) IBW/kg (Calculated) : 68.4  Adjusted Body Weight: 89.5 kg  Vital Signs: Temp: 97.3 F (36.3 C) (11/10 0700) Temp src: Oral (11/10 0700) BP: 108/70 mmHg (11/10 0700) Pulse Rate: 69  (11/10 0700)  Labs:  Basename 01/13/11 0625 01/12/11 2357 01/12/11 1833 01/12/11 1601 01/12/11 0620  HGB 7.8* -- -- -- --  HCT 23.6* -- -- -- --  PLT 278 -- -- -- --  APTT -- -- -- -- --  LABPROT 15.2 -- 15.8* -- --  INR 1.18 -- 1.23 -- --  HEPARINUNFRC 0.46 0.47 -- 0.27* --  CREATININE -- -- -- -- 0.88  CKTOTAL -- -- -- -- --  CKMB -- -- -- -- --  TROPONINI -- -- -- -- --   Estimated Creatinine Clearance: 91.6 ml/min (by C-G formula based on Cr of 0.88).   Medications:  Scheduled:     . chlorhexidine  15 mL Mouth/Throat BID  . docusate  100 mg Per Tube BID  . oxyCODONE  10 mg Oral Q12H  . pantoprazole  40 mg Oral Q1200  . polyethylene glycol  17 g Oral Daily  . warfarin  7.5 mg Oral ONCE-1800    Assessment: 70yo male with hx of PE, heparin level is at-goal.  Low goal due to recent surgery. INR is below-goal. Hgb of 7.6 noted, per RN - no bleeding.   Goal of Therapy:  heparin level 0.3-0.5   Plan:  1. Continue IV heparin at 2500 units/hr. 2. Coumadin 7.5mg  po today at 18:00 3. F/u heparin level and INR in AM.  Thad Ranger Mellody Drown PharmD BCPS 01/13/2011,11:51 AM

## 2011-01-13 NOTE — Progress Notes (Signed)
ANTICOAGULATION CONSULT NOTE - Follow Up Consult  Pharmacy Consult for heparin Indication: hx of PE  No Known Allergies  Patient Measurements: Height: 5\' 8"  (172.7 cm) (Entered for Cutover) Weight: 218 lb 0.6 oz (98.9 kg) IBW/kg (Calculated) : 68.4  Adjusted Body Weight: 89.5 kg  Vital Signs: Temp: 97.5 F (36.4 C) (11/09 2200) Temp src: Oral (11/09 2200) BP: 101/64 mmHg (11/09 2200) Pulse Rate: 67  (11/09 2200)  Labs:  Basename 01/12/11 2357 01/12/11 1833 01/12/11 1601 01/12/11 0620 01/10/11 0640  HGB -- -- -- -- 9.3*  HCT -- -- -- -- 27.1*  PLT -- -- -- -- 251  APTT -- -- -- -- --  LABPROT -- 15.8* -- -- --  INR -- 1.23 -- -- --  HEPARINUNFRC 0.47 -- 0.27* 0.13* --  CREATININE -- -- -- 0.88 0.77  CKTOTAL -- -- -- -- --  CKMB -- -- -- -- --  TROPONINI -- -- -- -- --   Estimated Creatinine Clearance: 91.6 ml/min (by C-G formula based on Cr of 0.88).   Medications:  Scheduled:     . chlorhexidine  15 mL Mouth/Throat BID  . docusate  100 mg Per Tube BID  . oxyCODONE  10 mg Oral Q12H  . pantoprazole  40 mg Oral Q1200  . polyethylene glycol  17 g Oral Daily  . warfarin  7.5 mg Oral ONCE-1800    Assessment: 70yo male with hx of PE now therapeutic on heparin; low goal due to recent Sx.  Goal of Therapy:  heparin level 0.3-0.5   Plan:  Will continue at current rate and confirm stable with am labs.  Colleen Can PharmD BCPS 01/13/2011,12:57 AM

## 2011-01-13 NOTE — Progress Notes (Signed)
Subjective:   Patient reports pain located lower back; improved from yesterday and tolerating PO .    Objective: Vital signs in last 24 hours: Filed Vitals:   01/12/11 1448 01/12/11 1840 01/12/11 2200 01/13/11 0700  BP: 91/59 99/62 101/64 108/70  Pulse: 69 75 67 69  Temp: 98.6 F (37 C) 97.9 F (36.6 C) 97.5 F (36.4 C) 97.3 F (36.3 C)  TempSrc: Oral Oral Oral Oral  Resp: 18 20 20 20   Height:      Weight:      SpO2: 94% 92% 95% 94%    Intake/Output Summary (Last 24 hours) at 01/13/11 1231 Last data filed at 01/13/11 1130  Gross per 24 hour  Intake 1067.08 ml  Output    550 ml  Net 517.08 ml    Weight change:   General: Alert, awake, oriented x3, in no acute distress. HEENT: No bruits, no goiter. Heart: Regular rate and rhythm, without murmurs, rubs, gallops. Lungs: Clear to auscultation bilaterally. Abdomen: Soft, nontender, nondistended, positive bowel sounds. Extremities: No clubbing cyanosis or edema with positive pedal pulses. Neuro: Grossly intact, nonfocal.    Lab Results: Results for orders placed during the hospital encounter of 12/31/10 (from the past 24 hour(s))  HEPARIN LEVEL     Status: Abnormal   Collection Time   01/12/11  4:01 PM      Component Value Range   Heparin Unfractionated 0.27 (*) 0.30 - 0.70 (IU/mL)  PROTIME-INR     Status: Abnormal   Collection Time   01/12/11  6:33 PM      Component Value Range   Prothrombin Time 15.8 (*) 11.6 - 15.2 (seconds)   INR 1.23  0.00 - 1.49   HEPARIN LEVEL     Status: Normal   Collection Time   01/12/11 11:57 PM      Component Value Range   Heparin Unfractionated 0.47  0.30 - 0.70 (IU/mL)  HEPARIN LEVEL     Status: Normal   Collection Time   01/13/11  6:25 AM      Component Value Range   Heparin Unfractionated 0.46  0.30 - 0.70 (IU/mL)  PROTIME-INR     Status: Normal   Collection Time   01/13/11  6:25 AM      Component Value Range   Prothrombin Time 15.2  11.6 - 15.2 (seconds)   INR 1.18  0.00  - 1.49   CBC     Status: Abnormal   Collection Time   01/13/11  6:25 AM      Component Value Range   WBC 11.8 (*) 4.0 - 10.5 (K/uL)   RBC 2.61 (*) 4.22 - 5.81 (MIL/uL)   Hemoglobin 7.8 (*) 13.0 - 17.0 (g/dL)   HCT 40.9 (*) 81.1 - 52.0 (%)   MCV 90.4  78.0 - 100.0 (fL)   MCH 29.9  26.0 - 34.0 (pg)   MCHC 33.1  30.0 - 36.0 (g/dL)   RDW 91.4  78.2 - 95.6 (%)   Platelets 278  150 - 400 (K/uL)     Micro: Recent Results (from the past 240 hour(s))  SURGICAL PCR SCREEN     Status: Normal   Collection Time   01/05/11  4:55 AM      Component Value Range Status Comment   MRSA, PCR NEGATIVE  NEGATIVE  Final    Staphylococcus aureus NEGATIVE  NEGATIVE  Final   URINE CULTURE     Status: Normal   Collection Time   01/11/11  5:28 PM  Component Value Range Status Comment   Specimen Description URINE, CATHETERIZED   Final    Special Requests NONE   Final    Setup Time [id number]   Final    Colony Count NO GROWTH   Final    Culture NO GROWTH   Final    Report Status 01/12/2011 FINAL   Final     Studies/Results: Dg Chest 1 View  01/12/2011  *RADIOLOGY REPORT*  Clinical Data: Rule out pneumonia  CHEST - 1 VIEW  Comparison: 01/08/2011  Findings: Cardiomediastinal silhouette is stable.  Mild elevation of the right hemidiaphragm with right basilar atelectasis.  Stable linear atelectasis or scarring in the lingula.  No acute infiltrate or convincing edema.  IMPRESSION:  Mild elevation of the right hemidiaphragm with right basilar atelectasis.  Stable linear atelectasis or scarring in the lingula. No acute infiltrate or convincing edema.  Original Report Authenticated By: Natasha Mead, M.D.    Medications:    . bisacodyl  10 mg Rectal BID  . chlorhexidine  15 mL Mouth/Throat BID  . docusate  100 mg Per Tube BID  . enoxaparin  40 mg Subcutaneous Q24H  . pantoprazole  40 mg Oral Q1200  . polyethylene glycol  17 g Oral Daily  . warfarin  7.5 mg Oral ONCE-1800  . warfarin  7.5 mg Oral  ONCE-1800  . DISCONTD: oxyCODONE  10 mg Oral Q12H    Assessment: Principal Problem:  *Pathologic fracture of vertebra Active Problems:  Metastatic multiple myeloma to bone  PULMONARY EMBOLISM, HX OF   Plan: 1. Pathologic fracture vertebra:   Patient does have lytic lesions lesions for some time now. Which resulted as a diagnosis of multiple myeloma. Patient was seen by Dr. Coletta Memos from neurosurgery. 2 procedures was done up to date. The first procedure was done on November 3. Anterolateral approach for L3 Vertebrectomy it anterolateral arthrodesis. Second procedure yesterday which is posterolateral arthrodesis. Patient is doing fine and is complaining about a lot of pain but he is moving his legs. We will get inpatient rehabilitation consultation. We will minimize his Valium and his IV morphine and due to sedation.  PT/OT have seen, he has been by CIR.He will go to inpatient rehab on Monday  2. Bony lytic lesions: This is been going on for a long time as mentioned above patient's phone with Dr. Arline Asp, and being treated presumptively for multiple myeloma. His hemoglobin is lower today at 7.8 . 3. History of pulmonary embolism: The patient was on Coumadin that's on hold for now because of the neurosurgery. Patient to be restarted on Coumadin whenever it's okay with neurosurgery. We will transition him to by mouth Coumadin and monitor INR and CBC closely .Since his HG is lower today we will dc heparin gtt and traNsition him to SQ lovenox .Continue the coumadin . 4. Anemia: Patient probably has chronic anemia from his multiple myeloma exacerbated by intra-operative blood loss. We'll monitor closely transfuse if hemoglobin less than 7. Check stool guaic's , dc heparin gtt . 5. Fever. Patient did have a low-grade fever yesterday of 100.9. He does have I will: Antibiotics for now if patient spikes fever again or the urine cultures come back positive will start on antibiotic. Patient mentioned  that his back pain is getting better so it's unlikely to have infected surgical wound site. We will obtain a chest x-ray. We will discontinue his Foley catheter today.   TO CIR Monday        LOS: 13 days  Tresea Heine 01/13/2011, 12:31 PM

## 2011-01-13 NOTE — Progress Notes (Signed)
Physical Therapy Treatment Patient Details Name: Rodney [name] MRN: [id number] DOB: 08/12/1940 Today's Date: 01/13/2011  PT Assessment/Plan  PT - Assessment/Plan Comments on Treatment Session: Pt progressing slowly with PT goals.  Wife present entire session & very supportive.   PT Plan: Discharge plan remains appropriate PT Frequency: Min 5X/week Follow Up Recommendations: Inpatient Rehab Equipment Recommended: Defer to next venue PT Goals  Acute Rehab PT Goals PT Goal: Rolling Supine to Left Side - Progress: Progressing toward goal PT Goal: Supine/Side to Sit - Progress: Progressing toward goal PT Goal: Sit to Supine/Side - Progress: Progressing toward goal PT Transfer Goal: Sit to Stand/Stand to Sit - Progress: Progressing toward goal PT Goal: Ambulate - Progress: Progressing toward goal Additional Goals PT Goal: Additional Goal #1 - Progress: Progressing toward goal  PT Treatment Precautions/Restrictions  Precautions Precautions: Back Precaution Booklet Issued: Yes (comment) Required Braces or Orthoses: Yes Spinal Brace: Lumbar corset;Applied in sitting position Mobility (including Balance) Bed Mobility Rolling Left: 3: Mod assist Rolling Left Details (indicate cue type and reason): cues for sequecing, technique, back precautions Left Sidelying to Sit: 4: Min assist Left Sidelying to Sit Details (indicate cue type and reason): assistance to lift shoulders/trunk to sitting EOB Sitting - Scoot to Edge of Bed:  (min guard) Sitting - Scoot to Edge of Bed Details (indicate cue type and reason): cues to decrease trunk flexion  Sit to Supine - Left: 3: Mod assist Sit to Supine - Left Details (indicate cue type and reason): assistance for sequencing, to lift LE's onto bed, guide shoulders/trunk to sidelying, maintain precautions Transfers Sit to Stand: 3: Mod assist Sit to Stand Details (indicate cue type and reason): assistance to achieve standing, balance, stabilize RW;  cues for technique, sequencing, hand placement, back precautions Stand to Sit: 4: Min assist Stand to Sit Details: cues for sequening, technique, hand placement, back precautions Ambulation/Gait Ambulation/Gait: Yes Ambulation/Gait Assistance: 4: Min assist Ambulation/Gait Assistance Details (indicate cue type and reason): assistance for balance, advancement of RW, safety, cues for sequencing, technique, upright posture Ambulation Distance (Feet): 20 Feet Assistive device: Rolling walker Gait Pattern: Decreased step length - right;Decreased step length - left;Decreased stride length;Trunk flexed (decreased step height) Stairs: No Wheelchair Mobility Wheelchair Mobility: No    Exercise    End of Session PT - End of Session Equipment Utilized During Treatment: Gait belt;Back brace Activity Tolerance: Patient limited by fatigue;Patient limited by pain (per pt) Patient left: in bed;with call bell in reach;with family/visitor present General Behavior During Session: [facility] for tasks performed Cognition: [facility] for tasks performed  Lara Mulch 01/13/2011, 1:07 PM

## 2011-01-14 LAB — BASIC METABOLIC PANEL
BUN: 11 mg/dL (ref 6–23)
CO2: 23 mEq/L (ref 19–32)
Chloride: 105 mEq/L (ref 96–112)
Creatinine, Ser: 0.71 mg/dL (ref 0.50–1.35)
GFR calc Af Amer: >90 mL/min (ref >90)
Glucose, Bld: 109 mg/dL — ABNORMAL HIGH (ref 70–99)
Potassium: 3.5 mEq/L (ref 3.5–5.1)

## 2011-01-14 LAB — PROTIME-INR
INR: 1.22 (ref 0.00–1.49)
Prothrombin Time: 15.7 seconds — ABNORMAL HIGH (ref 11.6–15.2)

## 2011-01-14 LAB — CBC
HCT: 24.6 % — ABNORMAL LOW (ref 39.0–52.0)
HCT: 26.5 % — ABNORMAL LOW (ref 39.0–52.0)
Hemoglobin: 8.2 g/dL — ABNORMAL LOW (ref 13.0–17.0)
Hemoglobin: 8.8 g/dL — ABNORMAL LOW (ref 13.0–17.0)
Hemoglobin: 8.8 g/dL — ABNORMAL LOW (ref 13.0–17.0)
MCH: 29.8 pg (ref 26.0–34.0)
MCV: 90.1 fL (ref 78.0–100.0)
Platelets: 338 10*3/uL (ref 150–400)
RBC: 2.74 MIL/uL — ABNORMAL LOW (ref 4.22–5.81)
RBC: 2.95 MIL/uL — ABNORMAL LOW (ref 4.22–5.81)
RBC: 2.94 MIL/uL — ABNORMAL LOW (ref 4.22–5.81)
WBC: 11.7 10*3/uL — ABNORMAL HIGH (ref 4.0–10.5)
WBC: 9.5 10*3/uL (ref 4.0–10.5)
WBC: 9.5 10*3/uL (ref 4.0–10.5)

## 2011-01-14 MED ORDER — POLYETHYLENE GLYCOL 3350 17 G PO PACK
17.0000 g | PACK | Freq: Once | ORAL | Status: AC
  Administered 2011-01-14: 17 g via ORAL
  Filled 2011-01-14: qty 1

## 2011-01-14 MED ORDER — WARFARIN SODIUM 10 MG PO TABS
10.0000 mg | ORAL_TABLET | Freq: Once | ORAL | Status: AC
  Administered 2011-01-14: 10 mg via ORAL
  Filled 2011-01-14: qty 1

## 2011-01-14 MED ORDER — PEG 3350-KCL-NA BICARB-NACL 420 G PO SOLR
500.0000 mL | Freq: Once | ORAL | Status: DC
Start: 2011-01-14 — End: 2011-01-14

## 2011-01-14 MED ORDER — FLEET ENEMA 7-19 GM/118ML RE ENEM
1.0000 | ENEMA | Freq: Every day | RECTAL | Status: DC | PRN
  Administered 2011-01-14: 16:00:00 via RECTAL
  Filled 2011-01-14: qty 1

## 2011-01-14 MED ORDER — MECLIZINE HCL 12.5 MG PO TABS
12.5000 mg | ORAL_TABLET | Freq: Three times a day (TID) | ORAL | Status: DC | PRN
  Filled 2011-01-14: qty 1

## 2011-01-14 MED ORDER — SODIUM CHLORIDE 0.9 % IV SOLN: INTRAVENOUS | Status: AC

## 2011-01-14 NOTE — Progress Notes (Addendum)
Subjective: Somewhat angry and agitated this morning, states he is frustrated with not being able to move around, his back pain is however well controlled, complains of feeling lightheaded when he sits up in bed and tries to ambulate, denies any chest pain or shortness of breath. Still constipated.  Objective: Vital signs in last 24 hours: Filed Vitals:   01/13/11 0700 01/13/11 1448 01/13/11 2308 01/14/11 0554  BP: 108/70 97/61 96/55  124/76  Pulse: 69 63 80 68  Temp: 97.3 F (36.3 C) 97.3 F (36.3 C) 98.2 F (36.8 C) 97.4 F (36.3 C)  TempSrc: Oral Oral Oral Oral  Resp: 20 18 18 20   Height:      Weight:      SpO2: 94% 92% 93% 94%    Intake/Output Summary (Last 24 hours) at 01/14/11 0957 Last data filed at 01/14/11 0700  Gross per 24 hour  Intake    540 ml  Output    700 ml  Net   -160 ml    Weight change:   General: Alert, awake, oriented x3, in no acute distress. HEENT: No bruits, no goiter. Heart: Regular rate and rhythm, without murmurs, rubs, gallops. Lungs: Clear to auscultation bilaterally. Abdomen: Soft, nontender, nondistended, positive bowel sounds. Extremities: No clubbing cyanosis or edema with positive pedal pulses. Neuro: Grossly intact, nonfocal. His back incision appears to be intact without any surrounding erythema and duration    Lab Results: Results for orders placed during the hospital encounter of 12/31/10 (from the past 24 hour(s))  CBC     Status: Abnormal   Collection Time   01/13/11  4:00 PM      Component Value Range   WBC 12.2 (*) 4.0 - 10.5 (K/uL)   RBC 2.88 (*) 4.22 - 5.81 (MIL/uL)   Hemoglobin 8.6 (*) 13.0 - 17.0 (g/dL)   HCT 16.1 (*) 09.6 - 52.0 (%)   MCV 90.3  78.0 - 100.0 (fL)   MCH 29.9  26.0 - 34.0 (pg)   MCHC 33.1  30.0 - 36.0 (g/dL)   RDW 04.5  40.9 - 81.1 (%)   Platelets 313  150 - 400 (K/uL)  CBC     Status: Abnormal   Collection Time   01/14/11 12:19 AM      Component Value Range   WBC 11.7 (*) 4.0 - 10.5 (K/uL)   RBC 2.74 (*) 4.22 - 5.81 (MIL/uL)   Hemoglobin 8.2 (*) 13.0 - 17.0 (g/dL)   HCT 91.4 (*) 78.2 - 52.0 (%)   MCV 89.8  78.0 - 100.0 (fL)   MCH 29.9  26.0 - 34.0 (pg)   MCHC 33.3  30.0 - 36.0 (g/dL)   RDW 95.6  21.3 - 08.6 (%)   Platelets 336  150 - 400 (K/uL)  PROTIME-INR     Status: Abnormal   Collection Time   01/14/11  6:15 AM      Component Value Range   Prothrombin Time 15.7 (*) 11.6 - 15.2 (seconds)   INR 1.22  0.00 - 1.49   CBC     Status: Abnormal   Collection Time   01/14/11  6:15 AM      Component Value Range   WBC 9.5  4.0 - 10.5 (K/uL)   RBC 2.95 (*) 4.22 - 5.81 (MIL/uL)   Hemoglobin 8.8 (*) 13.0 - 17.0 (g/dL)   HCT 57.8 (*) 46.9 - 52.0 (%)   MCV 90.2  78.0 - 100.0 (fL)   MCH 29.8  26.0 - 34.0 (pg)  MCHC 33.1  30.0 - 36.0 (g/dL)   RDW 11.9  14.7 - 82.9 (%)   Platelets 338  150 - 400 (K/uL)  CBC     Status: Abnormal   Collection Time   01/14/11  8:35 AM      Component Value Range   WBC 9.5  4.0 - 10.5 (K/uL)   RBC 2.94 (*) 4.22 - 5.81 (MIL/uL)   Hemoglobin 8.8 (*) 13.0 - 17.0 (g/dL)   HCT 56.2 (*) 13.0 - 52.0 (%)   MCV 90.1  78.0 - 100.0 (fL)   MCH 29.9  26.0 - 34.0 (pg)   MCHC 33.2  30.0 - 36.0 (g/dL)   RDW 86.5  78.4 - 69.6 (%)   Platelets 337  150 - 400 (K/uL)     Micro: Recent Results (from the past 240 hour(s))  SURGICAL PCR SCREEN     Status: Normal   Collection Time   01/05/11  4:55 AM      Component Value Range Status Comment   MRSA, PCR NEGATIVE  NEGATIVE  Final    Staphylococcus aureus NEGATIVE  NEGATIVE  Final   URINE CULTURE     Status: Normal   Collection Time   01/11/11  5:28 PM      Component Value Range Status Comment   Specimen Description URINE, CATHETERIZED   Final    Special Requests NONE   Final    Setup Time [id number]   Final    Colony Count NO GROWTH   Final    Culture NO GROWTH   Final    Report Status 01/12/2011 FINAL   Final     Studies/Results: Dg Chest 1 View  01/12/2011  *RADIOLOGY REPORT*  Clinical Data: Rule  out pneumonia  CHEST - 1 VIEW  Comparison: 01/08/2011  Findings: Cardiomediastinal silhouette is stable.  Mild elevation of the right hemidiaphragm with right basilar atelectasis.  Stable linear atelectasis or scarring in the lingula.  No acute infiltrate or convincing edema.  IMPRESSION:  Mild elevation of the right hemidiaphragm with right basilar atelectasis.  Stable linear atelectasis or scarring in the lingula. No acute infiltrate or convincing edema.  Original Report Authenticated By: Natasha Mead, M.D.    Medications:     . bisacodyl  10 mg Rectal BID  . chlorhexidine  15 mL Mouth/Throat BID  . docusate  100 mg Per Tube BID  . enoxaparin  40 mg Subcutaneous Q24H  . pantoprazole  40 mg Oral Q1200  . polyethylene glycol  17 g Oral Daily  . warfarin  7.5 mg Oral ONCE-1800  . DISCONTD: oxyCODONE  10 mg Oral Q12H    Assessment: Principal Problem:  *Pathologic fracture of vertebra Active Problems:  Metastatic multiple myeloma to bone  PULMONARY EMBOLISM, HX OF   Plan: 1. Pathologic fracture vertebra:  Patient was seen by Dr. Coletta Memos from neurosurgery. 2 procedures was done up to date. The first procedure was done on November 3. Anterolateral approach for L3 Vertebrectomy it anterolateral arthrodesis. Second procedure yesterday which is posterolateral arthrodesis. Patient is doing fine  .pain appears to be improved on a day-to-day basis. We will minimize his Valium and his IV morphine and due to sedation.  PT/OT have seen, he has been by CIR.He will go to inpatient rehab on Monday   2. Bony lytic lesions: This is been going on for a long time as mentioned above patient's phone with Dr. Arline Asp, and being treated presumptively for multiple myeloma. His hemoglobin is lower today  at 7.8 .  3. History of pulmonary embolism:  Patient has been restarted on Coumadin but INR is still subtherapeutic. We will transition him to by mouth Coumadin and monitor INR and CBC closely .Since his HG is  lower yesterday we dc'd  heparin gtt and traNsitioned him to SQ lovenox .Continue the coumadin . His hemoglobin is stable we'll stop serial cbc . 4. Anemia: Patient probably has chronic anemia from his multiple myeloma exacerbated by intra-operative blood loss. We'll monitor closely transfuse if hemoglobin less than 7. Check stool guaic's , dc heparin gtt . His hemoglobin is 8.8 today. 5. Fever. Resolved .monitor for signs and symptoms of infection .Patient mentioned that his back pain is getting better so it's unlikely to have infected surgical wound site. We will obtain a chest x-ray. We will discontinue his Foley catheter today. Currently on no antibiotics.  TO CIR Monday     LOS: 14 days   Rodney [name] 01/14/2011, 9:57 AM

## 2011-01-14 NOTE — Progress Notes (Signed)
ANTICOAGULATION CONSULT NOTE - Follow Up Consult  Pharmacy Consult for Coumadin Indication: h/o PE  No Known Allergies  Patient Measurements: Height: 5\' 8"  (172.7 cm) (Entered for Cutover) Weight: 218 lb 0.6 oz (98.9 kg) IBW/kg (Calculated) : 68.4    Vital Signs: Temp: 97.4 F (36.3 C) (11/11 0554) Temp src: Oral (11/11 0554) BP: 124/76 mmHg (11/11 0554) Pulse Rate: 68  (11/11 0554)  Labs:  Basename 01/14/11 0911 01/14/11 0835 01/14/11 0615 01/14/11 0019 01/13/11 0625 01/12/11 2357 01/12/11 1833 01/12/11 1601 01/12/11 0620  HGB -- 8.8* 8.8* -- -- -- -- -- --  HCT -- 26.5* 26.6* 24.6* -- -- -- -- --  PLT -- 337 338 336 -- -- -- -- --  APTT -- -- -- -- -- -- -- -- --  LABPROT -- -- 15.7* -- 15.2 -- 15.8* -- --  INR -- -- 1.22 -- 1.18 -- 1.23 -- --  HEPARINUNFRC -- -- -- -- 0.46 0.47 -- 0.27* --  CREATININE 0.71 -- -- -- -- -- -- -- 0.88  CKTOTAL -- -- -- -- -- -- -- -- --  CKMB -- -- -- -- -- -- -- -- --  TROPONINI -- -- -- -- -- -- -- -- --   Estimated Creatinine Clearance: 100.8 ml/min (by C-G formula based on Cr of 0.71).   Medications:  Scheduled:    . bisacodyl  10 mg Rectal BID  . chlorhexidine  15 mL Mouth/Throat BID  . docusate  100 mg Per Tube BID  . enoxaparin  40 mg Subcutaneous Q24H  . pantoprazole  40 mg Oral Q1200  . polyethylene glycol  17 g Oral Daily  . warfarin  7.5 mg Oral ONCE-1800  . DISCONTD: oxyCODONE  10 mg Oral Q12H    Assessment: 70 y/o male patient s/p spinal surgery, on chronic coumadin for h/o PE. UFH was d/c'd d/t low Hgb, changed to lovenox 40mg  daily. INR subtherapeutic and not moving, will increase dose. Checking for FOB. Goal of Therapy:  INR 2-3   Plan:  Coumadin 10mg  today and f/u in am. Takes 5mg  daily at home.  Nymir Ringler M 01/14/2011,11:11 AM

## 2011-01-14 NOTE — Progress Notes (Signed)
  Doing well. C/o appropriate incisional soreness.  Temp:  [97.3 F (36.3 C)-98.2 F (36.8 C)] 97.4 F (36.3 C) (11/11 0554) Pulse Rate:  [63-80] 68  (11/11 0554) Resp:  [18-20] 20  (11/11 0554) BP: (96-124)/(55-76) 124/76 mmHg (11/11 0554) SpO2:  [92 %-94 %] 94 % (11/11 0554)  Incision CDI  Plan: ?rehab in am

## 2011-01-15 ENCOUNTER — Inpatient Hospital Stay (HOSPITAL_COMMUNITY)
Admission: RE | Admit: 2011-01-15 | Discharge: 2011-01-22 | DRG: 945 | Disposition: A | Discharge status: Home / Self Care | Payer: Medicare Other | Source: Ambulatory Visit | Attending: Physical Medicine & Rehabilitation | Admitting: Physical Medicine & Rehabilitation

## 2011-01-15 DIAGNOSIS — M8448XA Pathological fracture, other site, initial encounter for fracture: Secondary | ICD-10-CM | POA: Diagnosis present

## 2011-01-15 DIAGNOSIS — D62 Acute posthemorrhagic anemia: Secondary | ICD-10-CM

## 2011-01-15 DIAGNOSIS — Z5189 Encounter for other specified aftercare: Principal | ICD-10-CM

## 2011-01-15 DIAGNOSIS — Z8546 Personal history of malignant neoplasm of prostate: Secondary | ICD-10-CM

## 2011-01-15 DIAGNOSIS — C9000 Multiple myeloma not having achieved remission: Secondary | ICD-10-CM

## 2011-01-15 DIAGNOSIS — K5900 Constipation, unspecified: Secondary | ICD-10-CM | POA: Diagnosis present

## 2011-01-15 DIAGNOSIS — Z7901 Long term (current) use of anticoagulants: Secondary | ICD-10-CM

## 2011-01-15 DIAGNOSIS — C61 Malignant neoplasm of prostate: Secondary | ICD-10-CM

## 2011-01-15 DIAGNOSIS — Z86718 Personal history of other venous thrombosis and embolism: Secondary | ICD-10-CM

## 2011-01-15 DIAGNOSIS — M9080 Osteopathy in diseases classified elsewhere, unspecified site: Secondary | ICD-10-CM | POA: Diagnosis present

## 2011-01-15 DIAGNOSIS — D630 Anemia in neoplastic disease: Secondary | ICD-10-CM | POA: Diagnosis present

## 2011-01-15 DIAGNOSIS — I951 Orthostatic hypotension: Secondary | ICD-10-CM | POA: Diagnosis present

## 2011-01-15 DIAGNOSIS — Z87891 Personal history of nicotine dependence: Secondary | ICD-10-CM

## 2011-01-15 LAB — POCT I-STAT 4, (NA,K, GLUC, HGB,HCT)
Glucose, Bld: 115 mg/dL — ABNORMAL HIGH (ref 70–99)
Glucose, Bld: 136 mg/dL — ABNORMAL HIGH (ref 70–99)
HCT: 37 % — ABNORMAL LOW (ref 39.0–52.0)
HCT: 32 % — ABNORMAL LOW (ref 39.0–52.0)
Hemoglobin: 12.6 g/dL — ABNORMAL LOW (ref 13.0–17.0)
Hemoglobin: 10.9 g/dL — ABNORMAL LOW (ref 13.0–17.0)
Potassium: 4.1 mEq/L (ref 3.5–5.1)
Sodium: 139 mEq/L (ref 135–145)
Sodium: 138 mEq/L (ref 135–145)

## 2011-01-15 LAB — CBC
Hemoglobin: 9 g/dL — ABNORMAL LOW (ref 13.0–17.0)
MCH: 30.1 pg (ref 26.0–34.0)
MCV: 90 fL (ref 78.0–100.0)
Platelets: 350 10*3/uL (ref 150–400)
RBC: 2.99 MIL/uL — ABNORMAL LOW (ref 4.22–5.81)
WBC: 11 10*3/uL — ABNORMAL HIGH (ref 4.0–10.5)

## 2011-01-15 LAB — POCT I-STAT 7, (LYTES, BLD GAS, ICA,H+H)
Acid-Base Excess: 0 mmol/L (ref 0.0–2.0)
Bicarbonate: 25.1 mEq/L — ABNORMAL HIGH (ref 20.0–24.0)
O2 Saturation: 100 %
pO2, Arterial: 340 mmHg — ABNORMAL HIGH (ref 80.0–100.0)

## 2011-01-15 MED ORDER — BISACODYL 10 MG RE SUPP: 10.0000 mg | Freq: Two times a day (BID) | RECTAL | Status: DC | PRN

## 2011-01-15 MED ORDER — POLYSACCHARIDE IRON 150 MG PO CAPS
150.0000 mg | ORAL_CAPSULE | Freq: Two times a day (BID) | ORAL | Status: DC
  Administered 2011-01-15 – 2011-01-22 (×14): 150 mg via ORAL
  Filled 2011-01-15 (×17): qty 1

## 2011-01-15 MED ORDER — ACETAMINOPHEN 325 MG PO TABS: 325.0000 mg | ORAL_TABLET | ORAL | Status: DC | PRN

## 2011-01-15 MED ORDER — GUAIFENESIN-DM 100-10 MG/5ML PO SYRP: 5.0000 mL | ORAL_SOLUTION | Freq: Four times a day (QID) | ORAL | Status: DC | PRN

## 2011-01-15 MED ORDER — OXYCODONE HCL 5 MG PO TABS
5.0000 mg | ORAL_TABLET | ORAL | Status: DC | PRN
  Administered 2011-01-15: 5 mg via ORAL
  Administered 2011-01-16: 10 mg via ORAL
  Administered 2011-01-16: 5 mg via ORAL
  Administered 2011-01-16 – 2011-01-21 (×14): 10 mg via ORAL
  Filled 2011-01-15: qty 2
  Filled 2011-01-15: qty 1
  Filled 2011-01-15 (×8): qty 2
  Filled 2011-01-15 (×2): qty 1
  Filled 2011-01-15 (×2): qty 2
  Filled 2011-01-15: qty 1
  Filled 2011-01-15 (×3): qty 2

## 2011-01-15 MED ORDER — PROMETHAZINE HCL 25 MG/ML IJ SOLN: 12.5000 mg | Freq: Four times a day (QID) | INTRAMUSCULAR | Status: DC | PRN

## 2011-01-15 MED ORDER — MECLIZINE HCL 12.5 MG PO TABS: 12.5000 mg | ORAL_TABLET | Freq: Three times a day (TID) | ORAL | Status: DC | PRN

## 2011-01-15 MED ORDER — LACTULOSE 10 GM/15ML PO SOLN: 20.0000 g | Freq: Every day | ORAL | Status: DC | PRN

## 2011-01-15 MED ORDER — POLYETHYLENE GLYCOL 3350 17 G PO PACK
17.0000 g | PACK | Freq: Every day | ORAL | Status: DC | PRN
  Filled 2011-01-15: qty 1

## 2011-01-15 MED ORDER — CHLORHEXIDINE GLUCONATE 0.12 % MT SOLN: 15.0000 mL | Freq: Two times a day (BID) | OROMUCOSAL | Status: DC

## 2011-01-15 MED ORDER — PROMETHAZINE HCL 12.5 MG RE SUPP: 12.5000 mg | Freq: Four times a day (QID) | RECTAL | Status: DC | PRN

## 2011-01-15 MED ORDER — TRAMADOL HCL 50 MG PO TABS
50.0000 mg | ORAL_TABLET | Freq: Four times a day (QID) | ORAL | Status: DC | PRN
  Administered 2011-01-16 – 2011-01-21 (×5): 50 mg via ORAL
  Filled 2011-01-15 (×5): qty 1

## 2011-01-15 MED ORDER — OXYCODONE HCL 5 MG PO TABS: 5.0000 mg | ORAL_TABLET | ORAL | Status: DC | PRN

## 2011-01-15 MED ORDER — ALBUTEROL SULFATE (5 MG/ML) 0.5% IN NEBU: 2.5000 mg | INHALATION_SOLUTION | RESPIRATORY_TRACT | Status: DC | PRN

## 2011-01-15 MED ORDER — TRAZODONE HCL 50 MG PO TABS: 50.0000 mg | ORAL_TABLET | Freq: Every evening | ORAL | Status: DC | PRN

## 2011-01-15 MED ORDER — WARFARIN SODIUM 5 MG PO TABS
5.0000 mg | ORAL_TABLET | Freq: Once | ORAL | Status: AC
  Administered 2011-01-15: 5 mg via ORAL
  Filled 2011-01-15: qty 1

## 2011-01-15 MED ORDER — BISACODYL 10 MG RE SUPP: 10.0000 mg | Freq: Two times a day (BID) | RECTAL | Status: DC

## 2011-01-15 MED ORDER — ENOXAPARIN SODIUM 40 MG/0.4ML ~~LOC~~ SOLN: SUBCUTANEOUS | Status: DC

## 2011-01-15 MED ORDER — POLYETHYLENE GLYCOL 3350 17 G PO PACK: 17.0000 g | PACK | Freq: Every day | ORAL | Status: AC

## 2011-01-15 MED ORDER — METHOCARBAMOL 500 MG PO TABS: 500.0000 mg | ORAL_TABLET | Freq: Three times a day (TID) | ORAL | Status: DC | PRN

## 2011-01-15 MED ORDER — PROMETHAZINE HCL 12.5 MG PO TABS: 12.5000 mg | ORAL_TABLET | Freq: Four times a day (QID) | ORAL | Status: DC | PRN

## 2011-01-15 MED ORDER — DIPHENHYDRAMINE HCL 12.5 MG/5ML PO ELIX: 12.5000 mg | ORAL_SOLUTION | Freq: Four times a day (QID) | ORAL | Status: DC | PRN

## 2011-01-15 MED ORDER — WARFARIN SODIUM 5 MG PO TABS: ORAL_TABLET | ORAL | Status: DC

## 2011-01-15 MED ORDER — ALUM & MAG HYDROXIDE-SIMETH 400-400-40 MG/5ML PO SUSP
30.0000 mL | ORAL | Status: DC | PRN
  Filled 2011-01-15: qty 30

## 2011-01-15 MED ORDER — CHLORHEXIDINE GLUCONATE 0.12 % MT SOLN
15.0000 mL | Freq: Two times a day (BID) | OROMUCOSAL | Status: DC
  Administered 2011-01-15 – 2011-01-22 (×14): 15 mL via OROMUCOSAL
  Filled 2011-01-15 (×16): qty 15

## 2011-01-15 MED ORDER — CALCIUM CARBONATE ANTACID 500 MG PO CHEW: 2.0000 | CHEWABLE_TABLET | ORAL | Status: DC | PRN

## 2011-01-15 MED ORDER — POLYETHYLENE GLYCOL 3350 17 G PO PACK
17.0000 g | PACK | Freq: Every day | ORAL | Status: DC
  Administered 2011-01-15 – 2011-01-22 (×8): 17 g via ORAL
  Filled 2011-01-15 (×9): qty 1

## 2011-01-15 MED ORDER — PANTOPRAZOLE SODIUM 40 MG PO TBEC
40.0000 mg | DELAYED_RELEASE_TABLET | Freq: Every day | ORAL | Status: DC
  Administered 2011-01-15 – 2011-01-22 (×8): 40 mg via ORAL
  Filled 2011-01-15 (×8): qty 1

## 2011-01-15 MED ORDER — ENOXAPARIN SODIUM 40 MG/0.4ML ~~LOC~~ SOLN
40.0000 mg | SUBCUTANEOUS | Status: DC
  Administered 2011-01-15 – 2011-01-17 (×3): 40 mg via SUBCUTANEOUS
  Filled 2011-01-15 (×4): qty 0.4

## 2011-01-15 MED ORDER — METHOCARBAMOL 500 MG PO TABS
500.0000 mg | ORAL_TABLET | Freq: Three times a day (TID) | ORAL | Status: DC | PRN
  Administered 2011-01-18 – 2011-01-21 (×4): 500 mg via ORAL
  Filled 2011-01-15 (×4): qty 1

## 2011-01-15 MED ORDER — PANTOPRAZOLE SODIUM 40 MG PO TBEC: 40.0000 mg | DELAYED_RELEASE_TABLET | Freq: Every day | ORAL | Status: DC

## 2011-01-15 MED ORDER — DOCUSATE SODIUM 50 MG/5ML PO LIQD: 100.0000 mg | Freq: Two times a day (BID) | ORAL | Status: DC

## 2011-01-15 MED ORDER — TRAZODONE HCL 50 MG PO TABS
50.0000 mg | ORAL_TABLET | Freq: Every evening | ORAL | Status: DC | PRN
  Filled 2011-01-15: qty 1

## 2011-01-15 NOTE — Progress Notes (Signed)
Patient admitted at 1505 from 3000 unit alert and oriented x3. Lumbar incision with dermabond intact  Lt flank incision with steristrips  Oriented patient and wife to room and call bell system. Reviewed lead nurse role with patient and lead nurse is [place]. Patient and  Wife verbalized understanding of orientation process . Continue with plan of care . Rodney [name]

## 2011-01-15 NOTE — Progress Notes (Signed)
Overall Plan of Care [facility]) Patient Details Name: Rodney [name] MRN: [id number] DOB: May 17, 1940  Diagnosis:    Primary Diagnosis:    <principal problem not specified> Co-morbidities: PEs, GERD, anemia  Functional Problem List  Patient demonstrates impairments in the following areas: Balance, Bowel, Endurance, Medication Management, Motor, Nutrition, Pain and Safety  Basic ADL's: grooming, bathing, dressing and toileting Advanced ADL's: simple meal preparation, full meal preparation, laundry and light housekeeping  Transfers:  bed mobility, bed to chair, toilet, tub/shower, car, furniture and floor Locomotion:  ambulation, wheelchair mobility and stairs  Additional Impairments:  Discharge Disposition  Anticipated Outcomes Item Anticipated Outcome  Eating/Swallowing    Basic self-care  Mod I  Tolieting  Mod I  Bowel/Bladder  Min assist  Transfers  Mod I  Locomotion  Mod I, except for stairs S  Communication    Cognition    Pain  <3/10 number scale  Safety/Judgment  Supervision with no falls  Other     Therapy Plan:         Team Interventions: Item RN PT OT SLP SW TR Other  Self Care/Advanced ADL Retraining   x      Neuromuscular Re-Education   x      Therapeutic Activities  x x   x   UE/LE Strength Training/ROM  x x      UE/LE Coordination Activities   x      Visual/Perceptual Remediation/Compensation         DME/Adaptive Equipment Instruction  x x   x   Therapeutic Exercise  x x   x   Balance/Vestibular Training  x x   x   Patient/Family Education x x x   x   Cognitive Remediation/Compensation         Functional Mobility Training  x    x   Ambulation/Gait Training  x       Museum/gallery curator  x       Wheelchair Propulsion/Positioning  x       Functional Tourist information centre manager Reintegration   x   x   Dysphagia/Aspiration Film/video editor         Bladder Management x        Bowel  Management x        Disease Management/Prevention         Pain Management x x x      Medication Management x        Skin Care/Wound Management x        Splinting/Orthotics x        Discharge Planning     x    Psychosocial Support     x                       Team Discharge Planning: Destination:  Home Projected Follow-up:  PT and OT Projected Equipment Needs:  Scientist, physiological ? Depends on progress. Patient/family involved in discharge planning:  Yes  MD ELOS: 9 Days Medical Rehab Prognosis:  Excellent Assessment: Patient admitted for CIR therapies consisting of PT at least 1-2 hours per day at least 5 days per week to address mobility, back precautions, pain mgt, adaptive equipment, and gait with Modified independent goals.  OT will see the patient at least 1-2 hours per day at least 5 days a week for fxnl mobility, self-care, back precautions, adaptive  equipment, and pt/family education with modified independent goals.  RN addressing pain, bowels, and bladder---min assist goals with b/b.  Patient is quite motivated

## 2011-01-15 NOTE — H&P (Signed)
Physical Medicine and Rehabilitation Admission H&P  Rodney [name] is an 70 y.o. male.    CC: Low back pain and lower extremity weakness.  HPI: Rodney [name] is an 70 y.o. male who developed acute low back pain with inability to stand on 01/01/11. Patient with history of prostate cancer, concerns about multiple myeloma with L3 and sacral lesions since 9/06 that had not significantly changed in the past. CT of spine done revealing L3 pathologic fracture. Evaluated by Dr. Arline Asp and Dr. Franky Macho. Patient underwent L3 corpectomy with vertebrectomy by Dr. Franky Macho on 01/09/11 and was on bedrest for 2 days. PT evaluation done and patient note to be limited by pain, weakness and decreased balance. MD, PT recommending CIR.  Pt required 4u PRBC for post-operative anemia as well as a unit of FFP.  Lesions were felt to be secondary to multiple myeloma. Perioperatively the patient's anticoagulation was reversed. After discussing the postoperative risk of bleeding and receiving consent from Dr. Franky Macho, the patient's anticoagulation was restarted. He was placed on a heparin drip. He was transitioned him to Coumadin and monitor INR and CBC closely . He was changed to lovenox for cross coverage prior to transfer. Anemia was felt to be due to chronic anemia from his multiple myeloma exacerbated by intra-operative blood loss. Fever resolved without any signs or symptoms of active infection.  Course also complicated by orthostatic hypotension and constipation.  BP responded to fluid resuscitation and bowel regimen  ROS:  Review of Systems  Constitutional: Negative.   HENT: Negative.   Eyes: Negative.   Respiratory: Negative.   Cardiovascular: Negative.   Gastrointestinal: Positive for constipation.  Musculoskeletal: Positive for back pain.  Skin: Negative.   Neurological: Positive for dizziness.  Psychiatric/Behavioral: The patient is nervous/anxious.   All other systems reviewed and are  negative.     Past Medical History  Diagnosis Date  . Pulmonary embolism   . Prostate cancer   . Pancreatitis   . Multiple myeloma   . Pulmonary embolism   . Degenerative disc disease     Past Surgical History  Procedure Date  . Cholecystectomy 04/06/2010  . Back surgery     Family History  Problem Relation Age of Onset  . Lymphoma Father 33  . Cancer Father   . Clotting disorder Mother     blood clots    Social History:  reports that he has quit smoking. He does not have any smokeless tobacco history on file. He reports that he does not drink alcohol or use illicit drugs.  Allergies: No Known Allergies  Medications Prior to Admission  Medication Dose Route Frequency Provider Last Rate Last Dose  . 0.9 %  sodium chloride infusion   Intravenous Continuous Nayana Abrol      . polyethylene glycol (MIRALAX / GLYCOLAX) packet 17 g  17 g Oral Once Elwin Sleight, PHARMD   17 g at 01/14/11 2205  . warfarin (COUMADIN) tablet 10 mg  10 mg Oral ONCE-1800 Christian M Home, PHARMD   10 mg at 01/14/11 1801  . DISCONTD: acetaminophen (TYLENOL) suppository 650 mg  650 mg Rectal Q4H PRN Meera Fernand Parkins, PHARMD      . DISCONTD: acetaminophen (TYLENOL) tablet 325 mg  325 mg Oral Q4H PRN Annia Belt, PHARMD      . DISCONTD: albuterol (PROVENTIL) (5 MG/ML) 0.5% nebulizer solution 2.5 mg  2.5 mg Nebulization Q6H PRN Annia Belt, PHARMD      . DISCONTD: bisacodyl (DULCOLAX) suppository  10 mg  10 mg Rectal BID Nayana Abrol   10 mg at 01/14/11 2206  . DISCONTD: calcium carbonate (TUMS - dosed in mg elemental calcium) chewable tablet 400 mg of elemental calcium  2 tablet Oral Q4H PRN Annia Belt, PHARMD      . DISCONTD: chlorhexidine (PERIDEX) 0.12 % solution 15 mL  15 mL Mouth/Throat BID Emeline Gins, PHARMD   15 mL at 01/15/11 1216  . DISCONTD: docusate (COLACE) 50 MG/5ML liquid 100 mg  100 mg Per Tube BID Hilario Quarry Amend, PHARMD   100 mg  at 01/14/11 2205  . DISCONTD: enoxaparin (LOVENOX) injection 40 mg  40 mg Subcutaneous Q24H Nayana Abrol   40 mg at 01/14/11 1801  . DISCONTD: hydroxypropyl methylcellulose (ISOPTO TEARS) 2.5 % ophthalmic solution 1 drop  1 drop Both Eyes Q12H PRN Ravisankar R Avva, MD      . DISCONTD: lactulose (CHRONULAC) 10 GM/15ML solution 20 g  20 g Oral Daily PRN Nayana Abrol      . DISCONTD: meclizine (ANTIVERT) tablet 12.5 mg  12.5 mg Oral TID PRN Nayana Abrol      . DISCONTD: methocarbamol (ROBAXIN) tablet 500 mg  500 mg Oral Q8H PRN Nayana Abrol   500 mg at 01/15/11 1225  . DISCONTD: morphine 2 MG/ML injection 1 mg  1 mg Intravenous TID BM PRN Nayana Abrol      . DISCONTD: oxyCODONE (Oxy IR/ROXICODONE) immediate release tablet 5-10 mg  5-10 mg Oral Q4H PRN Crystal Salomon Fick, PHARMD   5 mg at 01/14/11 1610  . DISCONTD: pantoprazole (PROTONIX) EC tablet 40 mg  40 mg Oral Q1200 Shan Levans, MD   40 mg at 01/15/11 1216  . DISCONTD: phenol (CHLORASEPTIC) mouth spray 1 spray  1 spray Mouth/Throat PRN Otho Bellows, PHARMD      . DISCONTD: polyethylene glycol (MIRALAX / GLYCOLAX) packet 17 g  17 g Oral Daily Annia Belt, PHARMD   17 g at 01/14/11 0924  . DISCONTD: polyethylene glycol-electrolytes (NuLYTELY/GoLYTELY) solution 500 mL  500 mL Oral Once Nayana Abrol      . DISCONTD: senna (SENOKOT) tablet 17.2 mg  2 tablet Oral Daily PRN Annia Belt, PHARMD      . DISCONTD: sodium chloride 0.9 % 1,000 mL with potassium chloride 20 mEq infusion   Intravenous Continuous Shan Levans, MD      . DISCONTD: sodium phosphate (FLEET) 7-19 GM/118ML enema 1 enema  1 enema Rectal Daily PRN Nayana Abrol      . DISCONTD: traZODone (DESYREL) tablet 50 mg  50 mg Oral QHS PRN Nayana Abrol       Medications Prior to Admission  Medication Sig Dispense Refill  . acetaminophen (TYLENOL) 500 MG tablet Take 500-1,000 mg by mouth every 6 (six) hours as needed. For pain       . albuterol (PROVENTIL) (5  MG/ML) 0.5% nebulizer solution Take 0.5 mLs (2.5 mg total) by nebulization every 4 (four) hours as needed for wheezing or shortness of breath.  20 mL  3  . bisacodyl (DULCOLAX) 10 MG suppository Place 1 suppository (10 mg total) rectally 2 (two) times daily.  12 suppository  0  . calcium carbonate (TUMS - DOSED IN MG ELEMENTAL CALCIUM) 500 MG chewable tablet Chew 2 tablets (400 mg of elemental calcium total) by mouth every 4 (four) hours as needed.  30 tablet  0  . chlorhexidine (PERIDEX) 0.12 % solution Use as directed 15 mLs in the mouth or throat  2 (two) times daily.  120 mL  0  . docusate (COLACE) 50 MG/5ML liquid Place 10 mLs (100 mg total) into feeding tube 2 (two) times daily.  100 mL  0  . enoxaparin (LOVENOX) 40 MG/0.4ML SOLN Dc when INR > 2.0  11.2 mL  0  . lactulose (CHRONULAC) 10 GM/15ML solution Take 30 mLs (20 g total) by mouth daily as needed.  240 mL  0  . meclizine (ANTIVERT) 12.5 MG tablet Take 1 tablet (12.5 mg total) by mouth 3 (three) times daily as needed for dizziness.  30 tablet  0  . methocarbamol (ROBAXIN) 500 MG tablet Take 1 tablet (500 mg total) by mouth every 8 (eight) hours as needed.  30 tablet  0  . oxyCODONE (OXY IR/ROXICODONE) 5 MG immediate release tablet Take 1-2 tablets (5-10 mg total) by mouth every 4 (four) hours as needed (pain).  30 tablet  0  . oxyCODONE-acetaminophen (PERCOCET) 5-325 MG per tablet Take 0.5 tablets by mouth every 4 (four) hours as needed. For pain        . pantoprazole (PROTONIX) 40 MG tablet Take 1 tablet (40 mg total) by mouth daily at 12 noon.  30 tablet  0  . polyethylene glycol (MIRALAX / GLYCOLAX) packet Take 17 g by mouth daily.  14 each  0  . traZODone (DESYREL) 50 MG tablet Take 1 tablet (50 mg total) by mouth at bedtime as needed for sleep.  30 tablet  0  . 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 Cues for hand placement, reinforcement of precautions.  Stand to Sit: 4: Min assist Stand to Sit Details: assistance to control descent; cues to maintain adherence to precautions- no twisting/no bending, cues for hand placement & body positioning before sitting Ambulation/Gait Ambulation/Gait: Yes Ambulation/Gait Assistance: (min guard) Ambulation/Gait Assistance Details (indicate cue type and reason): close guarding for safety; cues to increase/maintain upright posture; pt c/o of increased pain in L groin area due to levonox injection per pt.  Ambulation Distance (Feet): 50 Feet Assistive device: Rolling walker Gait Pattern: Step-through pattern Stairs: No Wheelchair Mobility Wheelchair Mobility: No            ADL:    Cognition: Showing improvement       There were no vitals taken for this visit.  Physical Exam: General appearance: alert and mild distress Head: Normocephalic, without obvious abnormality, atraumatic Eyes: conjunctivae/corneas clear. PERRL, EOM's intact. Fundi benign. Ears: normal TM's and external ear canals both ears Nose: Nares normal. Septum midline. Mucosa normal. No drainage or sinus tenderness. Throat: lips, mucosa, and tongue  normal; teeth and gums normal Neck: no adenopathy, no carotid  bruit, no JVD, supple, symmetrical, trachea midline and thyroid not enlarged, symmetric, no tenderness/mass/nodules Back: back tender with any movement.  Resp: clear to auscultation bilaterally Cardio: regular rate and rhythm, S1, S2 normal, no murmur, click, rub or gallop GI: soft, non-tender; bowel sounds normal; no masses,  no organomegaly Extremities: extremities normal, atraumatic, no cyanosis or edema Pulses: 2+ and symmetric Skin: Skin color, texture, turgor normal. No rashes or lesions Neurologic: alert and oriented.  Cranial nerve exam is normal. He moves the upper extremities with 4/5 strength with a bit more weakness proximally due to back pain. Her LE strength grossly 3/5 proximally to 5 out of 5 distally. Reflexes are bit hyperactive in lower chimneys at 2+ plus. 1+ in the upper tremor use. She exam is intact in all 4 limbs. Achilles appropriate Incision/Wound: Wound is clean dry and intact.  Results for orders placed during the hospital encounter of 12/31/10 (from the past 48 hour(s))  CBC     Status: Abnormal   Collection Time   01/13/11  4:00 PM      Component Value Range Comment   WBC 12.2 (*) 4.0 - 10.5 (K/uL)    RBC 2.88 (*) 4.22 - 5.81 (MIL/uL)    Hemoglobin 8.6 (*) 13.0 - 17.0 (g/dL)    HCT 45.4 (*) 09.8 - 52.0 (%)    MCV 90.3  78.0 - 100.0 (fL)    MCH 29.9  26.0 - 34.0 (pg)    MCHC 33.1  30.0 - 36.0 (g/dL)    RDW 11.9  14.7 - 82.9 (%)    Platelets 313  150 - 400 (K/uL)   CBC     Status: Abnormal   Collection Time   01/14/11 12:19 AM      Component Value Range Comment   WBC 11.7 (*) 4.0 - 10.5 (K/uL)    RBC 2.74 (*) 4.22 - 5.81 (MIL/uL)    Hemoglobin 8.2 (*) 13.0 - 17.0 (g/dL)    HCT 56.2 (*) 13.0 - 52.0 (%)    MCV 89.8  78.0 - 100.0 (fL)    MCH 29.9  26.0 - 34.0 (pg)    MCHC 33.3  30.0 - 36.0 (g/dL)    RDW 86.5  78.4 - 69.6 (%)    Platelets 336  150 - 400 (K/uL)   PROTIME-INR     Status:  Abnormal   Collection Time   01/14/11  6:15 AM      Component Value Range Comment   Prothrombin Time 15.7 (*) 11.6 - 15.2 (seconds)    INR 1.22  0.00 - 1.49    CBC     Status: Abnormal   Collection Time   01/14/11  6:15 AM      Component Value Range Comment   WBC 9.5  4.0 - 10.5 (K/uL)    RBC 2.95 (*) 4.22 - 5.81 (MIL/uL)    Hemoglobin 8.8 (*) 13.0 - 17.0 (g/dL)    HCT 29.5 (*) 28.4 - 52.0 (%)    MCV 90.2  78.0 - 100.0 (fL)    MCH 29.8  26.0 - 34.0 (pg)    MCHC 33.1  30.0 - 36.0 (g/dL)    RDW 13.2  44.0 - 10.2 (%)    Platelets 338  150 - 400 (K/uL)   CBC     Status: Abnormal   Collection Time   01/14/11  8:35 AM      Component Value Range Comment   WBC 9.5  4.0 - 10.5 (K/uL)  RBC 2.94 (*) 4.22 - 5.81 (MIL/uL)    Hemoglobin 8.8 (*) 13.0 - 17.0 (g/dL)    HCT 16.1 (*) 09.6 - 52.0 (%)    MCV 90.1  78.0 - 100.0 (fL)    MCH 29.9  26.0 - 34.0 (pg)    MCHC 33.2  30.0 - 36.0 (g/dL)    RDW 04.5  40.9 - 81.1 (%)    Platelets 337  150 - 400 (K/uL)   BASIC METABOLIC PANEL     Status: Abnormal   Collection Time   01/14/11  9:11 AM      Component Value Range Comment   Sodium 138  135 - 145 (mEq/L)    Potassium 3.5  3.5 - 5.1 (mEq/L)    Chloride 105  96 - 112 (mEq/L)    CO2 23  19 - 32 (mEq/L)    Glucose, Bld 109 (*) 70 - 99 (mg/dL)    BUN 11  6 - 23 (mg/dL)    Creatinine, Ser 9.14  0.50 - 1.35 (mg/dL)    Calcium 8.3 (*) 8.4 - 10.5 (mg/dL)    GFR calc non Af Amer >90  >90 (mL/min)    GFR calc Af Amer >90  >90 (mL/min)   PROTIME-INR     Status: Abnormal   Collection Time   01/15/11  6:30 AM      Component Value Range Comment   Prothrombin Time 17.6 (*) 11.6 - 15.2 (seconds)    INR 1.42  0.00 - 1.49    CBC     Status: Abnormal   Collection Time   01/15/11  6:30 AM      Component Value Range Comment   WBC 11.0 (*) 4.0 - 10.5 (K/uL)    RBC 2.99 (*) 4.22 - 5.81 (MIL/uL)    Hemoglobin 9.0 (*) 13.0 - 17.0 (g/dL)    HCT 78.2 (*) 95.6 - 52.0 (%)    MCV 90.0  78.0 - 100.0 (fL)     MCH 30.1  26.0 - 34.0 (pg)    MCHC 33.5  30.0 - 36.0 (g/dL)    RDW 21.3  08.6 - 57.8 (%)    Platelets 350  150 - 400 (K/uL)     No results found.  Post Admission Physician Evaluation: 1. Functional deficits secondary  To: L3 pathological fracture due to multiple myeloma s/p corpectomy and vertebrectomy 2. Patient admitted to receive collaborative, interdisciplinary care between the physiatrist, rehab nursing staff, and therapy team. 3. Patient's level of medical complexity and substantial therapy needs in context of that medical necessity cannot be provided at a lesser intensity of care. 4. Patient has experienced substantial functional loss from his/her baseline. Please see prior listed functional levels to see premorbid and current functional status. Judging by the patient's diagnosis, physical exam, and functional history, the patient has potential for functional progress which will result in measurable gains while on inpatient rehab.  These gains will be of substantial and practical use upon discharge in facilitating mobility and self-care at the household level. 5. Physiatrist will provide 24 hour management of medical needs as well as oversight of the therapy plan/treatment and provide guidance as appropriate regarding the interaction of the two. 6. 24 hour rehab nursing will assist in the management of  bladder management, bowel management, safety, skin/wound care, disease management, medication administration, pain management and patient education  and help integrate therapy concepts, techniques,education, etc. 7. PT will assess and treat for:  Marland Kitchen  Goals are: independent with assistive device.  8. OT will assess and treat for  .  Goals are: supervision.  9. SLP will assess and treat for  .  Goals are: N/A. 10. Case Management and Social Worker will assess and treat for psychological issues and discharge planning. 11. Team conference will be held weekly to assess progress toward goals and to  determine barriers to discharge. 12.  Patient will receive at least 3 hours of therapy per day at least 5 days per week. 13. ELOS and Prognosis: 1-2 weeks  excellent   Medical Problem List and Plan: 1. DVT Prophylaxis/Anticoagulation: Mechanical:  Antiembolism stockings, knee (TED hose) Bilateral lower extremities Sequential compression devices, below knee Bilateral lower extremities Pharmaceutical: Coumdin  and Lovenox 2. Pain Management: Tylenol, robaxin and oxycodone. Patient prefers to use minimal pain medication over fears of addiction. I encouraged the patient to utilize pain medication as needed given his history and pain symptoms. 3. Mood: Team will provide ego support 4. Constipation: Scheduled Senokot-S with as needed MiraLAX and Dulcolax suppository. 5. multiple myeloma: Workup per Dr. Arline Asp. Will discuss treatment plan although I suspect treatment will begin after discharge from rehabilitation. 6. history of orthostasis: Push by mouth fluids. TED stockings will be used as well. 7. anemia: This is due to his postoperative blood loss as well as his multiple myeloma. Iron supplementation daily. Check serial CBCs. We'll transfuse patient as appropriate. Encouraged dietary intake of iron. Patient Active Hospital Problem List: No active hospital problems.   SWARTZ,ZACHARY T 01/15/2011, 3:25 PM

## 2011-01-15 NOTE — Discharge Summary (Signed)
Physician Discharge Summary  Rodney [name] MRN: [id number] DOB/AGE: 68/10/1940 68 y.o.  PCP: [name], MD   Admit date: 12/31/2010 Discharge date: 01/15/2011  PRIMARY CARE PHYSICIAN: Stacie Glaze, MD with [facility]  Discharge Diagnoses:    Principal Problem:  Pathologic fracture of vertebra of L3   history of prostate cancer and  underwent a prostatectomy in the year 2003 appendectomy   Metastatic multiple myeloma to bone  PULMONARY EMBOLISM, HX OF   Bladder neck contracture.    Current Discharge Medication List    START taking these medications   Details  albuterol (PROVENTIL) (5 MG/ML) 0.5% nebulizer solution Take 0.5 mLs (2.5 mg total) by nebulization every 4 (four) hours as needed for wheezing or shortness of breath. Qty: 20 mL, Refills: 3    bisacodyl (DULCOLAX) 10 MG suppository Place 1 suppository (10 mg total) rectally 2 (two) times daily. Qty: 12 suppository, Refills: 0    calcium carbonate (TUMS - DOSED IN MG ELEMENTAL CALCIUM) 500 MG chewable tablet Chew 2 tablets (400 mg of elemental calcium total) by mouth every 4 (four) hours as needed. Qty: 30 tablet, Refills: 0    chlorhexidine (PERIDEX) 0.12 % solution Use as directed 15 mLs in the mouth or throat 2 (two) times daily. Qty: 120 mL, Refills: 0    docusate (COLACE) 50 MG/5ML liquid Place 10 mLs (100 mg total) into feeding tube 2 (two) times daily. Qty: 100 mL, Refills: 0    enoxaparin (LOVENOX) 40 MG/0.4ML SOLN Dc when INR > 2.0 Qty: 11.2 mL, Refills: 0    lactulose (CHRONULAC) 10 GM/15ML solution Take 30 mLs (20 g total) by mouth daily as needed. Qty: 240 mL, Refills: 0    meclizine (ANTIVERT) 12.5 MG tablet Take 1 tablet (12.5 mg total) by mouth 3 (three) times daily as needed for dizziness. Qty: 30 tablet, Refills: 0    methocarbamol (ROBAXIN) 500 MG tablet Take 1 tablet (500 mg total) by mouth every 8 (eight) hours as needed. Qty: 30 tablet,  Refills: 0    oxyCODONE (OXY IR/ROXICODONE) 5 MG immediate release tablet Take 1-2 tablets (5-10 mg total) by mouth every 4 (four) hours as needed (pain). Qty: 30 tablet, Refills: 0    pantoprazole (PROTONIX) 40 MG tablet Take 1 tablet (40 mg total) by mouth daily at 12 noon. Qty: 30 tablet, Refills: 0    polyethylene glycol (MIRALAX / GLYCOLAX) packet Take 17 g by mouth daily. Qty: 14 each, Refills: 0    traZODone (DESYREL) 50 MG tablet Take 1 tablet (50 mg total) by mouth at bedtime as needed for sleep. Qty: 30 tablet, Refills: 0      CONTINUE these medications which have CHANGED   Details  warfarin (COUMADIN) 5 MG tablet To be titrated by phramacy, goal inr 2-3 Qty: 30 tablet, Refills: 6      CONTINUE these medications which have NOT CHANGED   Details  acetaminophen (TYLENOL) 500 MG tablet Take 500-1,000 mg by mouth every 6 (six) hours as needed. For pain     oxyCODONE-acetaminophen (PERCOCET) 5-325 MG per tablet Take 0.5 tablets by mouth every 4 (four) hours as needed. For pain        STOP taking these medications     naproxen (NAPROSYN) 500 MG tablet      omeprazole (PRILOSEC) 20 MG capsule         Discharge Condition: to CIR   Disposition: TO CIR    Consults: Coletta Memos, M.D./NEUROSURGERY  Samul Dada, M.D.                  Larina Earthly, M.D.                    Enedina Finner, MD :UROLOGY      Significant Diagnostic Studies: Dg Chest 1 View  01/12/2011  *RADIOLOGY REPORT*   IMPRESSION:  Mild elevation of the right hemidiaphragm with right basilar atelectasis.  Stable linear atelectasis or scarring in the lingula. No acute infiltrate or convincing edema.  Original Report Authenticated By: Natasha Mead, M.D.   Dg Lumbar Spine 2-3 Views  01/09/2011  *RADIOLOGY REPORT*  C IMPRESSION: Stabilization L2-L4 as described above.  Original Report Authenticated By: Fuller Canada, M.D.   Dg Lumbar Spine  2-3 Views  01/09/2011  *RADIOLOGY REPORT*  IMPRESSION: Intraoperative localization as described.  Original Report Authenticated By: Gerrianne Scale, M.D.   Dg Lumbar Spine 2-3 Views Ordered By Rmm  01/05/2011  *RADIOLOGY REPORT*  Clinical Data: L3 corpectomy, felt to L4 fusion.  LUMBAR SPINE - 2-3 VIEW  Comparison: MRI 01/02/2011  Findings: Two intraoperative spot images demonstrate corpectomy at L3 with placement of a metallic spacer from the inferior L2 to the superior L4 vertebral bodies.  Normal alignment.  IMPRESSION: As above.  Original Report Authenticated By: Cyndie Chime, M.D.   Dg Lumbar Spine Complete  12/31/2010  *RADIOLOGY REPORT*  Clinical Data: 70 year old male with sudden onset of back pain radiating to the left leg.  LUMBAR SPINE - COMPLETE 4+ VIEW  Comparison: Bone survey 04/01/2008.  Lumbar MRI 02/24/2008.  IMPRESSION: Pathologic fracture of the L3 vertebral body through the level of a chronic L3 lesion which was thought to be benign on the basis of a 2009 MRI. No significant retropulsion of bone.  Does the patient have a known malignancy?  Per CMS PQRS reporting requirements (PQRS Measure 24): Given the patient's age of greater than 50 and the fracture site (hip, distal radius, or spine), the patient should be tested for osteoporosis using DXA, and the appropriate treatment considered based on the DXA results.  Original Report Authenticated By: Harley Hallmark, M.D.   Ct Lumbar Spine Wo Contrast  12/31/2010  **ADDENDUM** CREATED: 12/31/2010 19:17:49  Per CMS PQRS reporting requirements (PQRS Measure 24): Given the patient's age of greater than 50 and the fracture site (hip, distal radius, or spine), the patient should be tested for osteoporosis using DXA, and the appropriate treatment considered based on the DXA results.  **END ADDENDUM** SIGNED BY: Dineen Kid. Chestine Spore, M.D.   12/31/2010  *RADIOLOGY REPORT*  Clinical Data: Back pain.  L3 fracture.  History of multiple myeloma and  prostate cancer  CT LUMBAR SPINE WITHOUT CONTRAST  T IMPRESSION: Pathologic fracture of L3.  There is a lytic lesions throughout the L3 vertebral body compatible with multiple myeloma.  There is retropulsion of tumor and bone into the canal causing moderately severe spinal stenosis.  Large lytic lesion in the sacrum on the left compatible with myeloma.  Lumbar disc degeneration and facet degeneration as above.  Original Report Authenticated By: Camelia Phenes, M.D.   Mr Lumbar Spine W Wo Contrast  01/02/2011  *RADIOLOGY REPORT*  Clinical Data: Pathologic fracture.  Multiple myeloma.  Prostate cancer.  MRI LUMBAR SPINE WITHOUT AND WITH CONTRAST  IMPRESSION: When compared to the prior 2009  MR, there is been marked progression of abnormality involving the L3 vertebral body now with diffuse replacement of bone marrow with pathologic fracture with 25% loss of height.  Retropulsion of tumor posterior central aspect of the compressed vertebra greater to the left of midline contributes to mild impression upon the thecal sac greater to left midline and mass effect upon the origin of the left L4 nerve root.  Destructive lesion left aspect of the sacrum is incompletely assessed on the present exam.  Tumor extends towards the upper sacral neural foramen.  Small areas of altered signal intensity/enhancement involving T12 and L1.  Close attention to these areas to determine if this represents tumor versus degenerative changes.  Sagittal images raise possibility subtle enhancement of the conus.  Degenerative changes as detailed above.  Original Report Authenticated By: Fuller Canada, M.D.   Dg Chest Portable 1 View  01/08/2011  *RADIOLOGY REPORT*  Clinical Data: Atelectasis, follow-up  PORTABLE CHEST - 1 VIEW  Comparison: Portable chest x-ray of 01/06/2011  Findings: Linear atelectasis remains at both lung bases.  No focal infiltrate or effusion is seen.  Mild cardiomegaly is stable.  No bony abnormality is noted.   IMPRESSION: Persistent linear opacities at the lung bases most consistent with atelectasis.  Stable cardiomegaly.  Original Report Authenticated By: Juline Patch, M.D.   Dg Chest Portable 1 View  01/06/2011  *RADIOLOGY REPORT*  Clinical Data: Post transfusion.  PORTABLE CHEST - 1 VIEW  Comparison: 07/28/2010  Findings: 0914 hours.  Endotracheal tube tip is 4.5 cm above the base of the carina.  Lung volumes are low.  Subsegmental atelectasis seen at the left base.  Subsegmental atelectasis at the right base on the previous study has resolved.  NG tube tip overlies the proximal stomach. Telemetry leads overlie the chest.  IMPRESSION: Low volume film with left base atelectasis.  Original Report Authenticated By: ERIC A. MANSELL, M.D.   Dg Bone Survey Met  01/01/2011  *RADIOLOGY REPORT*  Clinical Data: Rule out multiple myeloma.  L3 fracture  METASTATIC BONE SURVEY    IMPRESSION: Pathologic fracture of L3.  No other fractures.  Lytic lesion left sacrum, better seen on the recent CT.  This may be myeloma.  Three small lucencies in the calvarium which could be due to myeloma however are nonspecific.  Original Report Authenticated By: Camelia Phenes, M.D.   Dg C-arm 61-120 Min  01/09/2011  CLINICAL DATA: Lumbar Intraop fusion   C-ARM 61-120 MINUTES  Fluoroscopy was utilized by the requesting physician.  No radiographic  interpretation.     Dg C-arm Gt 120 Min  01/05/2011  CLINICAL DATA: L3 corpectomy/ L2-4 fusion    C-ARM GT 120 MIN  Fluoroscopy was utilized by the requesting physician.  No radiographic  interpretation.        Microbiology: Recent Results (from the past 240 hour(s))  URINE CULTURE     Status: Normal   Collection Time   01/11/11  5:28 PM      Component Value Range Status Comment   Specimen Description URINE, CATHETERIZED   Final    Special Requests NONE   Final    Setup Time [id number]   Final    Colony Count NO GROWTH   Final    Culture NO GROWTH   Final    Report Status  01/12/2011 FINAL   Final      Labs: Results for orders placed during the hospital encounter of 12/31/10 (from the past 48 hour(s))  TYPE  AND SCREEN     Status: Normal   Collection Time   01/13/11  8:30 AM      Component Value Range Comment   ABO/RH(D) B POS      Antibody Screen NEG      Sample Expiration 01/16/2011     CBC     Status: Abnormal   Collection Time   01/13/11  4:00 PM      Component Value Range Comment   WBC 12.2 (*) 4.0 - 10.5 (K/uL)    RBC 2.88 (*) 4.22 - 5.81 (MIL/uL)    Hemoglobin 8.6 (*) 13.0 - 17.0 (g/dL)    HCT 40.9 (*) 81.1 - 52.0 (%)    MCV 90.3  78.0 - 100.0 (fL)    MCH 29.9  26.0 - 34.0 (pg)    MCHC 33.1  30.0 - 36.0 (g/dL)    RDW 91.4  78.2 - 95.6 (%)    Platelets 313  150 - 400 (K/uL)   CBC     Status: Abnormal   Collection Time   01/14/11 12:19 AM      Component Value Range Comment   WBC 11.7 (*) 4.0 - 10.5 (K/uL)    RBC 2.74 (*) 4.22 - 5.81 (MIL/uL)    Hemoglobin 8.2 (*) 13.0 - 17.0 (g/dL)    HCT 21.3 (*) 08.6 - 52.0 (%)    MCV 89.8  78.0 - 100.0 (fL)    MCH 29.9  26.0 - 34.0 (pg)    MCHC 33.3  30.0 - 36.0 (g/dL)    RDW 57.8  46.9 - 62.9 (%)    Platelets 336  150 - 400 (K/uL)   PROTIME-INR     Status: Abnormal   Collection Time   01/14/11  6:15 AM      Component Value Range Comment   Prothrombin Time 15.7 (*) 11.6 - 15.2 (seconds)    INR 1.22  0.00 - 1.49    CBC     Status: Abnormal   Collection Time   01/14/11  6:15 AM      Component Value Range Comment   WBC 9.5  4.0 - 10.5 (K/uL)    RBC 2.95 (*) 4.22 - 5.81 (MIL/uL)    Hemoglobin 8.8 (*) 13.0 - 17.0 (g/dL)    HCT 52.8 (*) 41.3 - 52.0 (%)    MCV 90.2  78.0 - 100.0 (fL)    MCH 29.8  26.0 - 34.0 (pg)    MCHC 33.1  30.0 - 36.0 (g/dL)    RDW 24.4  01.0 - 27.2 (%)    Platelets 338  150 - 400 (K/uL)   CBC     Status: Abnormal   Collection Time   01/14/11  8:35 AM      Component Value Range Comment   WBC 9.5  4.0 - 10.5 (K/uL)    RBC 2.94 (*) 4.22 - 5.81 (MIL/uL)    Hemoglobin 8.8  (*) 13.0 - 17.0 (g/dL)    HCT 53.6 (*) 64.4 - 52.0 (%)    MCV 90.1  78.0 - 100.0 (fL)    MCH 29.9  26.0 - 34.0 (pg)    MCHC 33.2  30.0 - 36.0 (g/dL)    RDW 03.4  74.2 - 59.5 (%)    Platelets 337  150 - 400 (K/uL)   BASIC METABOLIC PANEL     Status: Abnormal   Collection Time   01/14/11  9:11 AM      Component Value Range Comment   Sodium 138  135 - 145 (mEq/L)    Potassium 3.5  3.5 - 5.1 (mEq/L)    Chloride 105  96 - 112 (mEq/L)    CO2 23  19 - 32 (mEq/L)    Glucose, Bld 109 (*) 70 - 99 (mg/dL)    BUN 11  6 - 23 (mg/dL)    Creatinine, Ser 4.09  0.50 - 1.35 (mg/dL)    Calcium 8.3 (*) 8.4 - 10.5 (mg/dL)    GFR calc non Af Amer >90  >90 (mL/min)    GFR calc Af Amer >90  >90 (mL/min)   PROTIME-INR     Status: Abnormal   Collection Time   01/15/11  6:30 AM      Component Value Range Comment   Prothrombin Time 17.6 (*) 11.6 - 15.2 (seconds)    INR 1.42  0.00 - 1.49    CBC     Status: Abnormal   Collection Time   01/15/11  6:30 AM      Component Value Range Comment   WBC 11.0 (*) 4.0 - 10.5 (K/uL)    RBC 2.99 (*) 4.22 - 5.81 (MIL/uL)    Hemoglobin 9.0 (*) 13.0 - 17.0 (g/dL)    HCT 81.1 (*) 91.4 - 52.0 (%)    MCV 90.0  78.0 - 100.0 (fL)    MCH 30.1  26.0 - 34.0 (pg)    MCHC 33.5  30.0 - 36.0 (g/dL)    RDW 78.2  95.6 - 21.3 (%)    Platelets 350  150 - 400 (K/uL)      HPI  :The patient is a 70 year old Caucasian male  with a past medical history of pulmonary embolism and other issues as  mentioned above, who was in his usual state of health until 12/31/10 when he went to his daughter's house. He  was having some back pain in  his lower back, so he went up to the daughter's bedroom to lie down on  the bed. When he woke up and got up, he stood on the floor and then he  kind of slid down, could not stay standing up.He did not take a fall nor did he  have any trauma, he was simply walking and simply doubled over with pain  and was unable to walk or stand at that point  He had  severe back pain,  the daughter could not help him up either. EMS was called and the  patient was sent to [facility]. The  pain was 10/10 in intensity, he received Dilaudid and it came down to 4,  currently it is about 7/10 in intensity. He has been having the  lingering back pain for the past few weeks now according to his wife. He has actually no neurologic deficits. He has maintained bowel and  bladder control, both pre and post hospitalization.  Usually he has right-sided pain which radiates down the right leg, but  today it was the left-sided pain and did not really radiate anywhere.  The pain is aggravated by movement. It is relieved partially by laying  still. No real precipitant factors identified.  He underwent a CT and plain films of the  lumbar spine and what it revealed was a fracture of L3 and a lytic  lesion in the sacrum. These lesions had been previously identified and  at the very least 2006. It was suspected that they might represent  hemangioma or possible myeloma. He had undergone surgeon study since  that time  and had had an increase in Bence-Jones, had a monoclonal  gammopathy. He had bone scans and alike, but a definitive diagnosis was  never rendered. The lesion was felt to be very slow growing .    HOSPITAL COURSE:  #1.Pathologic fracture of L3  He underwent 1. Lateral approach for L3 vertebrectomy, L3 corpectomy.  2. Anterolateral arthrodesis with Nu[employer] instrumentation and  morselized allograft on 01/06/11. ESTIMATED BLOOD LOSS during surgery was : 4500.He received 4 units of packed red cells and 1 unit FFP in the OR. Pathology results was consistent with multiple myeloma. Post-operatively  PCCM has been consulted for ventilator  management. He underwent posterior fixation and arthrodesis on 01/09/11. He underwent POSTERIOR LUMBAR FUSION 2 LEVEL, l2-l4 Posterolateral arthrodesis, morsellized Allograft Non-segmental pedicle  screw fixation l2-l4 by Dr Coletta Memos. The patient had an inpatient rehabilitation consultation done by Dr. Harold Hedge, it was felt that the patient would be a candidate for inpatient rehabilitation and deficits such as balance bathing bowel and bladder control grooming psychosocial adjustment toileting and transferring would be addressed.  2. Bony lytic lesions: This is been going on for a long time as mentioned above and the patient will followup with Dr. Arline Asp, and being treated presumptively for multiple myeloma. His hemoglobin is lower today at 7.8 . Postoperatively he required 4 units of packed red blood cells and one unit of fresh frozen plasma. 3. History of pulmonary embolism: Perioperatively the patient's anticoagulation was reversed. After discussing the postoperative risk of bleeding and receiving consent from Dr. Franky Macho, the patient's anticoagulation was restarted. He was placed on a heparin drip. He was transitioned him to by mouth Coumadin and monitor INR and CBC closely .Since his HG is lower yesterday we dc'd heparin gtt and traNsitioned him to SQ lovenox .we will continue  the coumadin . His hemoglobin is stable for now he will have a repeat CBC done on the 14th. We daily INR monitoring. He will be continued on Lovenox until his INR is greater than 2, 4. Anemia: Patient probably has chronic anemia from his multiple myeloma exacerbated by intra-operative blood loss. We'll monitor closely transfuse if hemoglobin less than 7. His hemoglobin was stable prior to discharge, he will need repeat CBC on the 14th, as the patient's anticoagulation has been restarted.  5. Fever. Resolved .monitor for signs and symptoms of infection .Patient mentioned that his back pain is getting better so it's unlikely to have infected surgical wound site. A full infectious workup was done including a chest x-ray and a urine analysis and was found to be negative. The father the patient's postoperative fever  was probably related to atelectasis. His Foley catheter had been discontinued 3 days ago prior to his discharge.  #6 dizziness. The patient experienced some lightheadedness with ambulation, most related mostly related to his orthostatic hypotension. This significantly improved after the patient was given IV fluids. Follow precautions need to be observed while the patient is an inpatient rehabilitation.  #7 constipation the patient had a BM yesterday after a period of 6-7 days. After failure to respond to MiraLAX lactulose Dulcolax suppository and stool softeners the patient had to be administered GoLYTELY which finally caused him to more his bowels. Aggressive constipation regimen needs to be followed while the patient is an inpatient rehabilitation.  Discharge Exam:  Blood pressure 111/66, pulse 63, temperature 97.8 F (36.6 C), temperature source Oral, resp. rate 18, height 5\' 8"  (1.727 m), weight 98.9 kg (218 lb 0.6 oz), SpO2 96.00%.  General:  Alert, awake, oriented x3, in no acute distress. HEENT: No bruits, no goiter. Heart: Regular rate and rhythm, without murmurs, rubs, gallops. Lungs: Clear to auscultation bilaterally. Abdomen: Soft, nontender, nondistended, positive bowel sounds. Extremities: No clubbing cyanosis or edema with positive pedal pulses. Neuro: Grossly intact, nonfocal.     Discharge Orders    Future Appointments: Provider: Department: Dept Phone: Center:   01/30/2011 9:00 AM [facility] [phone] None   01/30/2011 9:30 AM Maryln Gottron, MD [facility] [phone] None     Future Orders Please Complete By Expires   Basic Metabolic Panel (BMET)      Questions: Responses:   Has the patient fasted?    Diet - low sodium heart healthy      Increase activity slowly      Lifting restrictions      Comments:   PER NEURODURGERY      Follow-up Information    Follow up with Samul Dada, MD.   Contact information:   [address] [place] [address] [phone]         #2 follow with PCP in 5-7 days  #3 follow up with Dr. Brunilda [facility] for his urethral stricture  Signed: [facility] 01/15/2011, 8:23 AM

## 2011-01-15 NOTE — PMR Pre-admission (Signed)
PMR Admission Coordinator Pre-Admission Assessment  Patient:  Rodney [name] is an 70 y.o., male MRN:  [id number] DOB:  Jun 14, 1940 Height:  Height: 5\' 8"  (172.7 cm) (Entered for [employer]) Weight:  Weight: 98.9 kg (218 lb 0.6 oz) S.S.N.:  [id number]  Insurance Information: PRIMARY:Medicare      Policy#:[id number]      Subscriber:self Pre-Cert#     Employer:Retired Benefits:  Phone #:     Name:[employer] Date:0701/09     Deduct:$1156      Out of Pocket ION:GEXB      Life MWU:[id number] CIR:100%      SNF:100 days    LBD+04/14/10 Outpatient:80%     Co-Pay:20% Home Health:100%      Co-Pay:none DME:80%     Co-Pay:20% Providers:patient's choice  Current Medical History:   Patient Admitting Diagnosis: Pathologic L3 vertebral fx  History of Present Illness: Underwent L3 corpectomy with vertebrectomy by Dr. Franky Macho 01/09/11.  H/O prostate cancer.  Participating with therapies and pain is better controlled. Patients Past Medical History:   Past Medical History  Diagnosis Date  . Pulmonary embolism   . Prostate cancer   . Pancreatitis   . Multiple myeloma   . Pulmonary embolism   . Degenerative disc disease    Family Medical History:  family history includes Cancer in his father; Clotting disorder in his mother; and Lymphoma (age of onset:87) in his father. NIH Stroke scale:  Height and Weight Height: 5\' 8"  (172.7 cm) (Entered for [employer]) Weight: 98.9 kg (218 lb 0.6 oz) BSA (Calculated - sq m): 2.18 sq meters BMI (Calculated): 33.4  Weight in (lb) to have BMI = 25: 164.1  Glascow Coma Scale:     Prior Rehab/Hospitalizations:   Medications PTA Medications:   Medications Prior to Admission  Medication Dose Route Frequency Provider Last Rate Last Dose  . 0.9 %  sodium chloride infusion   Intravenous Continuous Nayana Abrol      . acetaminophen (TYLENOL) suppository 650 mg  650 mg Rectal Q4H PRN Meera Fernand Parkins, PHARMD      . acetaminophen (TYLENOL) tablet 325 mg   325 mg Oral Q4H PRN Annia Belt, PHARMD      . albuterol (PROVENTIL) (5 MG/ML) 0.5% nebulizer solution 2.5 mg  2.5 mg Nebulization Q6H PRN Annia Belt, PHARMD      . bisacodyl (DULCOLAX) suppository 10 mg  10 mg Rectal BID Nayana Abrol   10 mg at 01/14/11 2206  . calcium carbonate (TUMS - dosed in mg elemental calcium) chewable tablet 400 mg of elemental calcium  2 tablet Oral Q4H PRN Annia Belt, PHARMD      . chlorhexidine (PERIDEX) 0.12 % solution 15 mL  15 mL Mouth/Throat BID Emeline Gins, PHARMD   15 mL at 01/14/11 2015  . docusate (COLACE) 50 MG/5ML liquid 100 mg  100 mg Per Tube BID Hilario Quarry Amend, PHARMD   100 mg at 01/14/11 2205  . enoxaparin (LOVENOX) injection 40 mg  40 mg Subcutaneous Q24H Nayana Abrol   40 mg at 01/14/11 1801  . gadobenate dimeglumine (MULTIHANCE) injection 20 mL  20 mL Intravenous Once Medication Radiologist   20 mL at 01/02/11 1115  . HYDROmorphone (DILAUDID) injection 1 mg  1 mg Intravenous Once [place], [place]   1 mg at 12/31/10 1751  . HYDROmorphone (DILAUDID) injection 1 mg  1 mg Intravenous Once [place], [place]   1 mg at 12/31/10 2023  . HYDROmorphone (DILAUDID) injection 2 mg  2  mg Intramuscular Once Langston Masker, [place]   2 mg at 12/31/10 1545  . hydroxypropyl methylcellulose (ISOPTO TEARS) 2.5 % ophthalmic solution 1 drop  1 drop Both Eyes Q12H PRN Ravisankar R Avva, MD      . lactulose (CHRONULAC) 10 GM/15ML solution 20 g  20 g Oral Daily PRN Nayana Abrol      . LORazepam (ATIVAN) 2 MG/ML injection 0.5 mg  0.5 mg Intravenous Once [place], [place]   0.5 mg at 12/31/10 2023  . meclizine (ANTIVERT) tablet 12.5 mg  12.5 mg Oral TID PRN Nayana Abrol      . methocarbamol (ROBAXIN) tablet 500 mg  500 mg Oral Q8H PRN Nayana Abrol   500 mg at 01/13/11 1723  . morphine 2 MG/ML injection 1 mg  1 mg Intravenous TID BM PRN Nayana Abrol      . ondansetron (ZOFRAN) injection 4 mg  4 mg Intramuscular Once [place], [place]   4 mg at  12/31/10 1545  . oxyCODONE (Oxy IR/ROXICODONE) immediate release tablet 5-10 mg  5-10 mg Oral Q4H PRN Crystal Salomon Fick, PHARMD   5 mg at 01/14/11 0981  . pantoprazole (PROTONIX) EC tablet 40 mg  40 mg Oral Q1200 Shan Levans, MD   40 mg at 01/13/11 1226  . phenol (CHLORASEPTIC) mouth spray 1 spray  1 spray Mouth/Throat PRN Otho Bellows, PHARMD      . polyethylene glycol (MIRALAX / GLYCOLAX) packet 17 g  17 g Oral Daily Annia Belt, PHARMD   17 g at 01/14/11 0924  . polyethylene glycol (MIRALAX / GLYCOLAX) packet 17 g  17 g Oral Once Elwin Sleight, PHARMD   17 g at 01/14/11 2205  . senna (SENOKOT) tablet 17.2 mg  2 tablet Oral Daily PRN Annia Belt, PHARMD      . sodium chloride 0.9 % 1,000 mL with potassium chloride 20 mEq infusion   Intravenous Continuous Shan Levans, MD      . sodium phosphate (FLEET) 7-19 GM/118ML enema 1 enema  1 enema Rectal Daily PRN Nayana Abrol      . traZODone (DESYREL) tablet 50 mg  50 mg Oral QHS PRN Nayana Abrol      . warfarin (COUMADIN) tablet 10 mg  10 mg Oral ONCE-1800 Christian M [place], PHARMD   10 mg at 01/14/11 1801  . warfarin (COUMADIN) tablet 7.5 mg  7.5 mg Oral ONCE-1800 Christian M Buettner, PHARMD   7.5 mg at 01/12/11 1750  . warfarin (COUMADIN) tablet 7.5 mg  7.5 mg Oral ONCE-1800 Emeline Gins, PHARMD   7.5 mg at 01/13/11 1723  . DISCONTD: antiseptic oral rinse (BIOTENE) solution 15 mL  15 mL Mouth Rinse BID Annia Belt, PHARMD      . DISCONTD: bisacodyl (DULCOLAX) suppository 10 mg  10 mg Rectal Daily PRN Annia Belt, PHARMD      . DISCONTD: bupivacaine liposome (EXPAREL) 1.3 % injection 266 mg  20 mL Infiltration Once Eugene Garnet, PHARMD      . DISCONTD: bupivacaine liposome (EXPAREL) 1.3 % injection    PRN Kyle L Cabbell   20 mL at 01/09/11 2030  . DISCONTD: dextrose 5 %-0.45 % sodium chloride infusion   Intravenous Continuous Annia Belt, PHARMD      . DISCONTD:  diazepam (VALIUM) injection 5 mg  5 mg Intravenous Q6H PRN Otho Bellows, PHARMD      . DISCONTD: diazepam (VALIUM) injection 5 mg  5 mg Intravenous Q6H  PRN Emeline Gins, PHARMD   5 mg at 01/07/11 2259  . DISCONTD: diazepam (VALIUM) tablet 5 mg  5 mg Oral Q6H PRN Meera Fernand Parkins, PHARMD      . DISCONTD: diazepam (VALIUM) tablet 5 mg  5 mg Oral Q6H PRN Otho Bellows, PHARMD      . DISCONTD: diazepam (VALIUM) tablet 5 mg  5 mg Oral Q6H PRN Emeline Gins, PHARMD   5 mg at 01/11/11 2348  . DISCONTD: diphenhydrAMINE (BENADRYL) 12.5 MG/5ML elixir 12.5 mg  12.5 mg Oral Q8H PRN Annia Belt, PHARMD      . DISCONTD: diphenhydrAMINE (BENADRYL) injection 12.5 mg  12.5 mg Intravenous Q8H PRN Annia Belt, PHARMD      . DISCONTD: docusate sodium (COLACE) capsule 100 mg  100 mg Oral BID Annia Belt, PHARMD      . DISCONTD: fentaNYL (SUBLIMAZE) 10 mcg/mL in sodium chloride 0.9 % 250 mL infusion  50-300 mcg/hr Intravenous Titrated Emeline Gins, PHARMD      . DISCONTD: fentaNYL (SUBLIMAZE) bolus via infusion 50-100 mcg  50-100 mcg Intravenous Q6H PRN Emeline Gins, PHARMD      . DISCONTD: heparin ADULT infusion 100 units/mL (25000 units/250 mL)  2,100 Units/hr Intravenous Continuous [place], [place] 21 mL/hr at 01/12/11 0011 2,100 Units/hr at 01/12/11 0011  . DISCONTD: heparin ADULT infusion 100 units/mL (25000 units/250 mL)  2,500 Units/hr Intravenous Continuous Tsz-Yin So, PHARMD   25 mL/hr at 01/13/11 1130  . DISCONTD: HYDROcodone-acetaminophen (NORCO) 5-325 MG per tablet 1 tablet  1 tablet Oral Q4H PRN Annia Belt, PHARMD      . DISCONTD: HYDROmorphone (DILAUDID) injection 0.25-0.5 mg  0.25-0.5 mg Intravenous Q5 min PRN W. Autumn Patty, MD      . DISCONTD: HYDROmorphone (DILAUDID) injection 2 mg  2 mg Intravenous Q3H PRN Annia Belt, PHARMD      . DISCONTD: HYDROmorphone (DILAUDID) PCA injection  0.3 mg/mL   Intravenous Q4H Emeline Gins, PHARMD      . DISCONTD: HYDROmorphone (DILAUDID) PCA injection 0.3 mg/mL   Intravenous Q4H Ravisankar R Avva, MD      . DISCONTD: lidocaine-EPINEPHrine (XYLOCAINE W/EPI) 0.5-1:200000 % (with pres) injection    PRN Kyle L Cabbell   20 mL at 01/09/11 1845  . DISCONTD: meperidine (DEMEROL) injection 6.25-12.5 mg  6.25-12.5 mg Intravenous PRN W. Autumn Patty, MD      . DISCONTD: methocarbamol (ROBAXIN) injection 1,000 mg  1,000 mg Intravenous BID PRN Nayana Abrol      . DISCONTD: methocarbamol (ROBAXIN) tablet 500 mg  500 mg Oral Q6H Annia Belt, PHARMD      . DISCONTD: morphine 2 MG/ML injection 1-2 mg  1-2 mg Intravenous Q4H PRN Mutaz A Elmahi      . DISCONTD: morphine 2 MG/ML injection 2-4 mg  2-4 mg Intravenous Q4H PRN Crystal Salomon Fick, PHARMD      . DISCONTD: morphine 2 MG/ML injection 2-5 mg  2-5 mg Intravenous Q1H PRN Kyle L Cabbell   5 mg at 01/10/11 0007  . DISCONTD: morphine 2 MG/ML injection 2-5 mg  2-5 mg Intravenous Q1H PRN Gary Fleet Abbott, PHARMD      . DISCONTD: morphine 2 MG/ML injection 2-6 mg  2-6 mg Intravenous Q4H PRN Otho Bellows, PHARMD      . DISCONTD: morphine 4 MG/ML injection 2-5 mg  2-5 mg Intravenous Q1H PRN Gary Fleet Abbott, PHARMD   4 mg at 01/10/11 0606  .  DISCONTD: naloxone [facility]) injection 0.4 mg  0.4 mg Intravenous PRN Emeline Gins, PHARMD      . DISCONTD: ondansetron [facility]) injection 4 mg  4 mg Intravenous Q6H PRN Annia Belt, PHARMD      . DISCONTD: ondansetron [facility]) injection 4 mg  4 mg Intravenous Q4H PRN Meera Fernand Parkins, PHARMD      . DISCONTD: oxyCODONE (OXYCONTIN) 12 hr tablet 10 mg  10 mg Oral [address] [place], PHARMD   10 mg at 01/13/11 6578  . DISCONTD: oxyCODONE-acetaminophen (PERCOCET) 5-325 MG per tablet 1-2 tablet  1-2 tablet Oral Q4H PRN Meera Fernand Parkins, PHARMD      . DISCONTD: pantoprazole  (PROTONIX) EC tablet 40 mg  40 mg Oral BID Annia Belt, PHARMD      . DISCONTD: pantoprazole (PROTONIX) injection 40 mg  40 mg Intravenous Q12H Hilario Quarry Amend, PHARMD   40 mg at 01/07/11 1015  . DISCONTD: phenylephrine (NEO-SYNEPHRINE) 10,000 mcg in dextrose 5 % 250 mL infusion  30-200 mcg/min Intravenous Titrated Hilario Quarry Amend, PHARMD      . DISCONTD: phenylephrine (NEO-SYNEPHRINE) 20,000 mcg in dextrose 5 % 250 mL infusion  30-200 mcg/min Intravenous Titrated Hilario Quarry Amend, PHARMD      . DISCONTD: polyethylene glycol-electrolytes (NuLYTELY/GoLYTELY) solution 500 mL  500 mL Oral Once Nayana Abrol      . DISCONTD: promethazine (PHENERGAN) injection 6.25 mg  6.25 mg Intravenous Q6H PRN Annia Belt, PHARMD   6.25 mg at 01/10/11 0305  . DISCONTD: promethazine (PHENERGAN) injection 6.25-12.5 mg  6.25-12.5 mg Intravenous Q15 min PRN W. Autumn Patty, MD      . DISCONTD: propofol (DIPRIVAN) 10 MG/ML infusion  5-50 mcg/kg/min Intravenous Titrated Emeline Gins, PHARMD      . DISCONTD: sodium chloride 0.9 % injection 9 mL  9 mL Intravenous PRN Emeline Gins, PHARMD      . DISCONTD: Surgifoam 100 with Thrombin 20,000 units (20 ml) topical solution    PRN Kyle L Cabbell      . DISCONTD: thrombin spray    PRN Carmela Hurt       Medications Prior to Admission  Medication Sig Dispense Refill  . albuterol (PROVENTIL) (5 MG/ML) 0.5% nebulizer solution Take 0.5 mLs (2.5 mg total) by nebulization every 4 (four) hours as needed for wheezing or shortness of breath.  20 mL  3  . bisacodyl (DULCOLAX) 10 MG suppository Place 1 suppository (10 mg total) rectally 2 (two) times daily.  12 suppository  0  . calcium carbonate (TUMS - DOSED IN MG ELEMENTAL CALCIUM) 500 MG chewable tablet Chew 2 tablets (400 mg of elemental calcium total) by mouth every 4 (four) hours as needed.  30 tablet  0  . chlorhexidine (PERIDEX) 0.12 % solution Use as directed 15 mLs in the  mouth or throat 2 (two) times daily.  120 mL  0  . docusate (COLACE) 50 MG/5ML liquid Place 10 mLs (100 mg total) into feeding tube 2 (two) times daily.  100 mL  0  . enoxaparin (LOVENOX) 40 MG/0.4ML SOLN Dc when INR > 2.0  11.2 mL  0  . lactulose (CHRONULAC) 10 GM/15ML solution Take 30 mLs (20 g total) by mouth daily as needed.  240 mL  0  . meclizine (ANTIVERT) 12.5 MG tablet Take 1 tablet (12.5 mg total) by mouth 3 (three) times daily as needed for dizziness.  30 tablet  0  . methocarbamol (ROBAXIN) 500 MG tablet Take  1 tablet (500 mg total) by mouth every 8 (eight) hours as needed.  30 tablet  0  . oxyCODONE (OXY IR/ROXICODONE) 5 MG immediate release tablet Take 1-2 tablets (5-10 mg total) by mouth every 4 (four) hours as needed (pain).  30 tablet  0  . pantoprazole (PROTONIX) 40 MG tablet Take 1 tablet (40 mg total) by mouth daily at 12 noon.  30 tablet  0  . polyethylene glycol (MIRALAX / GLYCOLAX) packet Take 17 g by mouth daily.  14 each  0  . traZODone (DESYREL) 50 MG tablet Take 1 tablet (50 mg total) by mouth at bedtime as needed for sleep.  30 tablet  0  . warfarin (COUMADIN) 5 MG tablet To be titrated by phramacy, goal inr 2-3  30 tablet  6   Current Medications: Current facility-administered medications:0.9 %  sodium chloride infusion, , Intravenous, Continuous, Nayana Abrol;  acetaminophen (TYLENOL) suppository 650 mg, 650 mg, Rectal, Q4H PRN, Meera Fernand Parkins, PHARMD;  acetaminophen (TYLENOL) tablet 325 mg, 325 mg, Oral, Q4H PRN, Annia Belt, PHARMD;  albuterol (PROVENTIL) (5 MG/ML) 0.5% nebulizer solution 2.5 mg, 2.5 mg, Nebulization, Q6H PRN, Annia Belt, PHARMD bisacodyl (DULCOLAX) suppository 10 mg, 10 mg, Rectal, BID, Nayana Abrol, 10 mg at 01/14/11 2206;  calcium carbonate (TUMS - dosed in mg elemental calcium) chewable tablet 400 mg of elemental calcium, 2 tablet, Oral, Q4H PRN, Annia Belt, PHARMD;  chlorhexidine (PERIDEX) 0.12 % solution 15 mL,  15 mL, Mouth/Throat, BID, Emeline Gins, PHARMD, 15 mL at 01/14/11 2015 docusate (COLACE) 50 MG/5ML liquid 100 mg, 100 mg, Per Tube, BID, Hilario Quarry Amend, PHARMD, 100 mg at 01/14/11 2205;  enoxaparin (LOVENOX) injection 40 mg, 40 mg, Subcutaneous, Q24H, Nayana Abrol, 40 mg at 01/14/11 1801;  hydroxypropyl methylcellulose (ISOPTO TEARS) 2.5 % ophthalmic solution 1 drop, 1 drop, Both Eyes, Q12H PRN, Ravisankar R Avva, MD;  lactulose (CHRONULAC) 10 GM/15ML solution 20 g, 20 g, Oral, Daily PRN, Nayana Abrol meclizine (ANTIVERT) tablet 12.5 mg, 12.5 mg, Oral, TID PRN, Nayana Abrol;  methocarbamol (ROBAXIN) tablet 500 mg, 500 mg, Oral, Q8H PRN, Nayana Abrol, 500 mg at 01/13/11 1723;  morphine 2 MG/ML injection 1 mg, 1 mg, Intravenous, TID BM PRN, Nayana Abrol;  oxyCODONE (Oxy IR/ROXICODONE) immediate release tablet 5-10 mg, 5-10 mg, Oral, Q4H PRN, [employer], PHARMD, 5 mg at 01/14/11 0937 pantoprazole (PROTONIX) EC tablet 40 mg, 40 mg, Oral, Q1200, Shan Levans, MD, 40 mg at 01/13/11 1226;  phenol (CHLORASEPTIC) mouth spray 1 spray, 1 spray, Mouth/Throat, PRN, Terri L Green, PHARMD;  polyethylene glycol (MIRALAX / GLYCOLAX) packet 17 g, 17 g, Oral, Daily, Annia Belt, PHARMD, 17 g at 01/14/11 3086;  polyethylene glycol (MIRALAX / GLYCOLAX) packet 17 g, 17 g, Oral, Once, Elwin Sleight, PHARMD, 17 g at 01/14/11 2205 senna (SENOKOT) tablet 17.2 mg, 2 tablet, Oral, Daily PRN, Annia Belt, PHARMD;  sodium chloride 0.9 % 1,000 mL with potassium chloride 20 mEq infusion, , Intravenous, Continuous, Shan Levans, MD;  sodium phosphate (FLEET) 7-19 GM/118ML enema 1 enema, 1 enema, Rectal, Daily PRN, Nayana Abrol;  traZODone (DESYREL) tablet 50 mg, 50 mg, Oral, QHS PRN, Nayana Abrol warfarin (COUMADIN) tablet 10 mg, 10 mg, Oral, ONCE-1800, Christian M Weir, PHARMD, 10 mg at 01/14/11 1801;  DISCONTD: polyethylene glycol-electrolytes (NuLYTELY/GoLYTELY) solution 500  mL, 500 mL, Oral, Once, Nayana Abrol  Additional Precautions/Restrictions: Precautions Precautions: Back Precaution Booklet Issued: Yes (comment) Precaution Comments: Pt unable to recall any of  the back precautions--> reeducated pt on all 3 precautions (BAT) Required Braces or Orthoses: Yes Spinal Brace: Lumbar corset;Applied in sitting position  Therapy Assessments Cognition Arousal/Alertness: Lethargic Overall Cognitive Status: Appears within functional limits for tasks assessed Orientation Level: Oriented X4 Cognition Arousal/Alertness: Lethargic Orientation Level: Oriented X4 Home Living Lives With: Spouse Receives Help From: Family Type of Home: Apartment Home Layout: One level (Apartment on second level) Home Access: Stairs to enter Entrance Stairs-Rails: Lawyer of Steps: 1 flight - approx 10 Home Adaptive Equipment: None Prior Function Level of Independence: Independent with homemaking with ambulation Driving: Yes 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 Touch: Appears Intact  ADLs/Mobility:    Bed Mobility Bed Mobility: Yes Rolling Left: 3: Mod assist Rolling Left Details (indicate cue type and reason): cues for sequecing, technique, back precautions Left Sidelying to Sit: 4: Min assist Left Sidelying to Sit Details (indicate cue type and reason): assistance to lift shoulders/trunk to sitting EOB Sitting - Scoot to [employer] of Bed:  (min guard) Sitting - Scoot to [employer] of Bed Details (indicate cue type and reason): cues to decrease trunk flexion  Sit to Supine - Left: 3: Mod assist Sit to Supine - Left Details (indicate cue type and reason): assistance for sequencing, to lift LE's onto bed, guide shoulders/trunk to sidelying, maintain precautions Transfers Transfers: Yes Sit to Stand: 3: Mod assist Sit to Stand Details (indicate cue type and reason): assistance to achieve standing, balance, stabilize RW; cues  for technique, sequencing, hand placement, back precautions Stand to Sit: 4: Min assist Stand to Sit Details: cues for sequening, technique, hand placement, back precautions Stand Pivot Transfers: 2: Max assist Stand Pivot Transfer Details (indicate cue type and reason): Pivot back to bed secondary to low BP Ambulation/Gait Ambulation/Gait: Yes Ambulation/Gait Assistance: 4: Min assist Ambulation/Gait Assistance Details (indicate cue type and reason): assistance for balance, advancement of RW, safety, cues for sequencing, technique, upright posture Ambulation Distance (Feet): 20 Feet Assistive device: Rolling walker Gait Pattern: Decreased step length - right;Decreased step length - left;Decreased stride length;Trunk flexed (decreased step height) Stairs: No Wheelchair Mobility Wheelchair Mobility: No  Home Assistive Devices/Equipment:  Home Assistive Devices/Equipment Home Assistive Devices/Equipment: None  Discharge Planning:  Discharge Planning Living Arrangements: Spouse/significant other Support Systems: Children Do you have any problems obtaining your medications?: No Type of Residence: Private residence Patient expects to be discharged to:: Home  Prior Functional Levels:  Prior Functional Level Bed Mobility: I Transfers: I Mobility - Walk/Wheelchair: I Upper Body Dressing: I Lower Body Dressing: I Grooming: I Eating/Drinking: I Toilet Transfer: I Bladder Continence: WNL Bowel Management: WNL Stair Climbing: I Communication: WNL Memory: WNL Cooking/Meal Prep: I Housework: I Money Management: I Driving: Yes Current Functional Levels:  Current Functional Level Bladder Continence: Voiding Bowel Management: Last BM 01/14/11 Previous Home Environment:  Previous Home Environment Living Arrangements: Spouse/significant other Support Systems: Children Do you have any problems obtaining your medications?: No Type of Residence: Private residence Patient expects to be  discharged to:: Home Discharge Living Setting:  Discharge Living Setting Plans for Discharge Living Setting: Apartment Discharge Living Setting Number of Levels: 1 Discharge Living Setting Number of Steps:  (about 10 steps entry) Discharge Living Setting is Bedroom on Main Floor?: Yes Discharge Living Setting is Bathroom on Main Floor?: Yes Social/Family/Support Systems:  Social/Family/Support Systems Patient Roles: Spouse;Parent Contact Information:  Daulton Harbaugh (c) [phone], Noelle Penner [phone]) Anticipated Caregiver: wife Anticipated Caregiver's Contact Information: wife [phone] Ability/Limitations of Caregiver: can assist  Caregiver Availability: 24/7 Discharge Plan Discussed with Primary Caregiver: Yes Is Caregiver In Agreement with Plan?: Yes Does Caregiver/Family have Issues with Lodging/Transportation while Pt is in Rehab?: No (wife has issues with patient's 2 daughters) Goals/Additional Needs:  Goals/Additional Needs Patient/Family Goal for Rehab: PT mod I and OT Min to Mod I goals (ELOS 8-16 days) Cultural Considerations: Mormon ([employer], [employer]) Dietary Needs: Regular Equipment Needs: TBD Pt/Family Agrees to Admission and willing to participate: Yes Program Orientation Provided & Reviewed with Pt/Caregiver Including Roles  & Responsibilities: Yes  Preadmission Screen Completed By:  Trish Mage, 01/15/2011 12:10 PM  Patient's condition:  Please see physician update to information in consult dated 01/12/11 0929.  Preadmission Screen Competed [id number] [place], Time/Date,1207/01/15/12.  Discussed status with Dr. Riley Kill on11/12/12 at 1209 and received telephone approval for admission today.  Admission Coordinator:  Trish Mage, time1209/Date11/12/12  .

## 2011-01-15 NOTE — Progress Notes (Signed)
Physical Therapy Treatment Patient Details Name: Rodney [name] MRN: [id number] DOB: 1940/12/17 Today's Date: 01/15/2011  PT Assessment/Plan  PT - Assessment/Plan Comments on Treatment Session: Pt progressing with PT goals.  Rodney [name], CIR admission coordinator present & speaking with pt/wife at end of session.   PT Plan: Discharge plan remains appropriate PT Frequency: Min 5X/week Follow Up Recommendations: Inpatient Rehab Equipment Recommended: Defer to next venue PT Goals  Acute Rehab PT Goals PT Transfer Goal: Sit to Stand/Stand to Sit - Progress: Progressing toward goal PT Goal: Ambulate - Progress: Progressing toward goal Additional Goals PT Goal: Additional Goal #1 - Progress: Progressing toward goal  PT Treatment Precautions/Restrictions  Precautions Precautions: Back Precaution Comments:  Required Braces or Orthoses: Yes Spinal Brace: Lumbar corset;Applied in sitting position Mobility (including Balance) Bed Mobility Bed Mobility: No (sitting EOB upon arrival & positioned in chair after session) Transfers Transfers: Yes Sit to Stand: 4: Min assist Sit to Stand Details (indicate cue type and reason): assistance to achieve standing, trunk/hip/knee extension, steady RW.  Cues for hand placement, reinforcement of precautions.   Stand to Sit: 4: Min assist Stand to Sit Details: assistance to control descent; cues to maintain adherence to precautions- no twisting/no bending, cues for hand placement & body positioning before sitting Ambulation/Gait Ambulation/Gait: Yes Ambulation/Gait Assistance:  (min guard) Ambulation/Gait Assistance Details (indicate cue type and reason): close guarding for safety; cues to increase/maintain upright posture; pt c/o of increased pain in L groin area due to levonox injection per pt.   Ambulation Distance (Feet): 50 Feet Assistive device: Rolling walker Gait Pattern: Step-through pattern Stairs: No Wheelchair Mobility Wheelchair  Mobility: No    Exercise    End of Session PT - End of Session Equipment Utilized During Treatment: Back brace;Gait belt Activity Tolerance: Patient limited by pain Patient left: in chair;with call bell in reach;with family/visitor present General Behavior During Session: [facility] for tasks performed Cognition: [facility] for tasks performed  Lara Mulch 01/15/2011, 2:00 PM

## 2011-01-15 NOTE — Progress Notes (Signed)
ANTICOAGULATION CONSULT NOTE - Follow Up Consult  Pharmacy Consult for Coumadin Indication:  Hx PE   May 2012  No Known Allergies   Labs:  Basename 01/15/11 1636 01/15/11 0630 01/14/11 0911 01/14/11 0835 01/14/11 0615 01/13/11 0625 01/12/11 2357  HGB -- 9.0* -- 8.8* -- -- --  HCT -- 26.9* -- 26.5* 26.6* -- --  PLT -- 350 -- 337 338 -- --  APTT -- -- -- -- -- -- --  LABPROT 20.1* 17.6* -- -- 15.7* -- --  INR 1.68* 1.42 -- -- 1.22 -- --  HEPARINUNFRC -- -- -- -- -- 0.46 0.47  CREATININE -- -- 0.71 -- -- -- --  CKTOTAL -- -- -- -- -- -- --  CKMB -- -- -- -- -- -- --  TROPONINI -- -- -- -- -- -- --    Assessment:    Patient transferred from Neuro unit to Rehab today. Pharmacy has been assisting with Anticoagulation since 01/01/11.    Coumadin was resumed  11/9 with 7.5 mg.  7.5 mg was given on 11/10, and 10 mg on 11/11.  INR this afternoon is 1.68.   IV heparin dc'd 11/11 and Lovenox 40 mg SQ Q24hr begun 11/11.  Coumadin 5 mg to be given tonight.  Goal of Therapy:  INR 2-3   Plan:    Coumadin 5 mg tonight as ordered.    Daily PT/INR.   Lovenox 40 mg SQ q24hrs to continue until INR >2.    Rodney [name] 01/15/2011,5:30 PM

## 2011-01-16 DIAGNOSIS — M8448XA Pathological fracture, other site, initial encounter for fracture: Secondary | ICD-10-CM

## 2011-01-16 DIAGNOSIS — D62 Acute posthemorrhagic anemia: Secondary | ICD-10-CM

## 2011-01-16 DIAGNOSIS — C9000 Multiple myeloma not having achieved remission: Secondary | ICD-10-CM

## 2011-01-16 DIAGNOSIS — C61 Malignant neoplasm of prostate: Secondary | ICD-10-CM

## 2011-01-16 LAB — DIFFERENTIAL
Basophils Absolute: 0.1 10*3/uL (ref 0.0–0.1)
Basophils Relative: 1 % (ref 0–1)
Lymphocytes Relative: 17 % (ref 12–46)
Monocytes Absolute: 1.2 10*3/uL — ABNORMAL HIGH (ref 0.1–1.0)
Neutro Abs: 8.3 10*3/uL — ABNORMAL HIGH (ref 1.7–7.7)
Neutrophils Relative %: 68 % (ref 43–77)

## 2011-01-16 LAB — URINALYSIS, ROUTINE W REFLEX MICROSCOPIC
Leukocytes, UA: NEGATIVE
Nitrite: NEGATIVE
Specific Gravity, Urine: 1.027 (ref 1.005–1.030)
Urobilinogen, UA: 1 mg/dL (ref 0.0–1.0)
pH: 6 (ref 5.0–8.0)

## 2011-01-16 LAB — COMPREHENSIVE METABOLIC PANEL
BUN: 11 mg/dL (ref 6–23)
CO2: 22 mEq/L (ref 19–32)
Calcium: 8.6 mg/dL (ref 8.4–10.5)
Chloride: 105 mEq/L (ref 96–112)
Creatinine, Ser: 0.8 mg/dL (ref 0.50–1.35)
GFR calc Af Amer: >90 mL/min (ref >90)
GFR calc non Af Amer: 90 mL/min — ABNORMAL LOW (ref >90)
Glucose, Bld: 107 mg/dL — ABNORMAL HIGH (ref 70–99)
Total Bilirubin: 0.3 mg/dL (ref 0.3–1.2)

## 2011-01-16 LAB — CBC
HCT: 27.8 % — ABNORMAL LOW (ref 39.0–52.0)
Hemoglobin: 9.1 g/dL — ABNORMAL LOW (ref 13.0–17.0)
MCHC: 32.7 g/dL (ref 30.0–36.0)
MCV: 91.4 fL (ref 78.0–100.0)

## 2011-01-16 LAB — PROTIME-INR: INR: 1.72 — ABNORMAL HIGH (ref 0.00–1.49)

## 2011-01-16 MED ORDER — WARFARIN SODIUM 5 MG PO TABS
5.0000 mg | ORAL_TABLET | Freq: Once | ORAL | Status: AC
  Administered 2011-01-16: 5 mg via ORAL
  Filled 2011-01-16: qty 1

## 2011-01-16 MED ORDER — FLUCONAZOLE 100MG IVPB
100.0000 mg | INTRAVENOUS | Status: DC
  Filled 2011-01-16 (×2): qty 50

## 2011-01-16 MED ORDER — FE FUMARATE-B12-VIT C-FA-IFC PO CAPS
1.0000 | ORAL_CAPSULE | Freq: Three times a day (TID) | ORAL | Status: DC
  Administered 2011-01-16 – 2011-01-22 (×19): 1 via ORAL
  Filled 2011-01-16 (×24): qty 1

## 2011-01-16 NOTE — Progress Notes (Signed)
Pt is alert and oriented. Pt transfers with walker and one assist.  Pt ambulates to bathroom and is continent of urine. Pt's pain is controlled with current medication regimen.  Pt is independent with meals. Pt ambulates with corsett brace. Pt complained of the  brace cutting into his skin, skin checked no breakdown; biomed phoned to check on brace Rodney [name]

## 2011-01-16 NOTE — Progress Notes (Signed)
Patient information reviewed and entered into [facility] system by Dolce Sylvia, RN, CRRN, PPS Coordinator.  Information including medical coding and functional independence measure will be reviewed and updated through discharge.     Per nursing patient was given "Data Collection Information Summary for Patients in Inpatient Rehabilitation Facilities with attached "Privacy Act Statement-Health Care Records" upon admission.   

## 2011-01-16 NOTE — Progress Notes (Signed)
Recreational Therapy Assessment and Plan  Patient Details  Name: SATOSHI KALAS MRN: [id number] Date of Birth: [birth year]  Rehab Potential: Good   ELOS:10 days     Assessment  Clinical Impression: Patient is a 70 y.o. year old male who developed acute low back pain with inability to stand on 01/01/11. Patient with history of prostate cancer, concerns about multiple myeloma with L3 and sacral lesions since 9/06 that had not significantly changed in the past. CT of spine done revealing L3 pathologic fracture. Evaluated by Dr. Arline Asp and Dr. Franky Macho. Patient underwent L3 corpectomy with vertebrectomy by Dr. Franky Macho on 01/09/11 and was on bedrest for 2 days. PT evaluation done and patient note to be limited by pain, weakness and decreased balance. MD, PT recommending CIR. Pt required 4u PRBC for post-operative anemia as well as a unit of FFP. Lesions were felt to be secondary to multiple myeloma. Perioperatively the patient's anticoagulation was reversed. After discussing the postoperative risk of bleeding and receiving consent from Dr. Franky Macho, the patient's anticoagulation was restarted. He was placed on a heparin drip. He was transitioned him to Coumadin and monitor INR and CBC closely . He was changed to lovenox for cross coverage prior to transfer. Anemia was felt to be due to chronic anemia from his multiple myeloma exacerbated by intra-operative blood loss. Fever resolved without any signs or symptoms of active infection. Course also complicated by orthostatic hypotension and constipation. BP responded to fluid resuscitation and bowel regimen. Patient transferred to CIR on 01/15/2011 .  Patient presents to CIR with decreased activity tolerance, decreased standing balance, decreased functional mobility, increased pain limiting patients independence with leisure/community pursuits.  Pt reports being extremely active PTA.   Recreational Therapy Leisure History/Participation Premorbid leisure  interest/current participation: Sports - Golf;Sports - Exercise (Comment);Nature - Lawn care;Community - Other (Comment);[place] - Fishing;Community - Press photographer - Journalist, newspaper - Travel (Comment) (active with grandkids, soccer, etc) Spiritual Interests: Church Other Leisure Interests: Television Leisure Participation Style: With Family/Friends Awareness of Community Resources: Multimedia programmer Appropriate for Education?: Yes Social interaction - Mood/Behavior: Cooperative Recreational Therapy Orientation Orientation -Reviewed with patient: Available activity resources Strengths/Weaknesses Patient Strengths/Abilities: Willingness to participate;Active premorbidly Patient weaknesses: Physical limitations  Plan  Discharge Criteria: Patient will be discharged from TR if patient refuses treatment 3 consecutive times without medical reason.  If treatment goals not met, if there is a change in medical status, if patient makes no progress towards goals or if patient is discharged from hospital.  The above assessment, treatment plan, treatment alternatives and goals were discussed and mutually agreed upon: by patient/family.  Eevee Borbon 01/16/2011, 4:45 PM

## 2011-01-16 NOTE — Progress Notes (Addendum)
Physical Therapy Assessment and Plan and Treatment  Patient Details  Name: Rodney [name] MRN: [id number] Date of Birth: [birth year] Treatment time: 1000-1105 PT Diagnosis: Abnormal posture, Difficulty walking, Low back pain and Muscle weakness Rehab Potential: Good ELOS: 7-10 days   Assessment & Plan Clinical Impression: Patient is a 70 y.o. year old male who developed acute low back pain with inability to stand on 01/01/11. Patient with history of prostate cancer, concerns about multiple myeloma with L3 and sacral lesions since 9/06 that had not significantly changed in the past. CT of spine done revealing L3 pathologic fracture. Evaluated by Dr. Arline Asp and Dr. Franky Macho. Patient underwent L3 corpectomy with vertebrectomy by Dr. Franky Macho on 01/09/11 and was on bedrest for 2 days. PT evaluation done and patient note to be limited by pain, weakness and decreased balance. MD, PT recommending CIR. Pt required 4u PRBC for post-operative anemia as well as a unit of FFP. Lesions were felt to be secondary to multiple myeloma. Perioperatively the patient's anticoagulation was reversed. After discussing the postoperative risk of bleeding and receiving consent from Dr. Franky Macho, the patient's anticoagulation was restarted. He was placed on a heparin drip. He was transitioned him to Coumadin and monitor INR and CBC closely . He was changed to lovenox for cross coverage prior to transfer. Anemia was felt to be due to chronic anemia from his multiple myeloma exacerbated by intra-operative blood loss. Fever resolved without any signs or symptoms of active infection. Course also complicated by orthostatic hypotension and constipation. BP responded to fluid resuscitation and bowel regimen. Patient transferred to CIR on 01/15/2011 .    Patient currently requires min with mobility secondary to muscle weakness and decreased power and endurance, decreased activity tolerance, decreased balance and postural control, pain,  mild dizziness, and impaired cognition, likely related to meds.  Prior to hospitalization, patient was independent  with mobility and lived with Spouse in a Apartment home.  Home access is  on second level, with about 10 steps with 2 rails, to enter.  Patient will benefit from skilled PT intervention to maximize safe functional mobility, minimize fall risk and decrease caregiver burden for planned discharge home with 24 hour supervision.  Anticipate patient will benefit from follow up OP at discharge.  PT - End of Session Activity Tolerance: Tolerates 10 - 20 min activity with multiple rests Endurance Deficit: Yes Endurance Deficit Description: mobility limited due to pain PT Assessment Rehab Potential: Good Barriers to Discharge: None PT Plan PT Frequency: 1-2 X/day, 60-90 minutes Estimated Length of Stay: 7-10 days PT Treatment/Interventions: Ambulation/gait training;Balance/vestibular training;Patient/family education;Pain management;DME/adaptive equipment instruction;Functional mobility training;Splinting/orthotics;Stair training;Therapeutic Activities;Therapeutic Exercise;UE/LE Strength taining/ROM;Wheelchair propulsion/positioning  Precautions/Restrictions Precautions Precautions: Back;Fall Required Braces or Orthoses: Yes Spinal Brace: Applied in sitting position Restrictions Weight Bearing Restrictions: No General @[id number] ([phone]::1) Vital Signs Therapy Vitals Pulse Rate: 72  BP: 101/71 mmHg Patient Position, if appropriate: Sitting Pain Pain Assessment Pain Assessment: 0-10 Pain Score:   8 Pain Type: Surgical pain Pain Location: Back Pain Onset: On-going Pain Intervention(s): RN made aware (discussed timing of pain meds) Home Living/Prior Functioning Home Living Lives With: Spouse Receives Help From: Family Type of Home: Apartment Home Layout: One level Home Access: Stairs to enter [employer] Shower/Tub: Engineer, manufacturing systems: Standard Home  Adaptive Equipment: Long-handled sponge Prior Function Level of Independence: Independent with gait;Independent with transfers Able to Take Stairs?: Yes Driving: Yes Vision/Perception  Vision - History Baseline Vision: No visual deficits Patient Visual Report: No change from baseline Vision - Assessment Eye  Alignment: Within Functional Limits Perception Perception: Within Functional Limits Praxis Praxis: Intact  Cognition Overall Cognitive Status: Appears within functional limits for tasks assessed Arousal/Alertness: Awake/alert Orientation Level: Oriented X4 Attention: Divided (stopped walking when answering a question) Focused Attention: Appears intact Sustained Attention: Appears intact Selective Attention: Appears intact Alternating Attention: Appears intact Memory: Impaired (repeated stories of military service 2x in 15 min) Awareness: Appears intact Problem Solving: Impaired Problem Solving Impairment: Functional basic (new learning) Behaviors: Lability (tearful during eval) Safety/Judgment: Appears intact Sensation Sensation Light Touch: Appears Intact Stereognosis: Appears Intact Hot/Cold: Appears Intact Proprioception: Appears Intact Coordination Gross Motor Movements are Fluid and Coordinated: Yes Fine Motor Movements are Fluid and Coordinated: Yes Finger Nose Finger Test: [facility] Motor  Motor Motor: Abnormal postural alignment and control (forward flexed at hips in standing) Motor - Skilled Clinical Observations: generalized weakness  Mobility Bed Mobility Bed Mobility: Yes Rolling Right: 5: Supervision Rolling Right Details: Tactile cues for sequencing;Tactile cues for placement Right Sidelying to Sit: 5: Supervision Right Sidelying to Sit Details: Verbal cues for sequencing Sitting - Scoot to Edge of Bed: 5: Supervision Sit to Supine - Left: 5: Supervision Transfers Sit to Stand: 4: Min assist;With upper extremity assist;With armrests Sit to Stand  Details: Verbal cues for technique Stand to Sit: 4: Min assist;With upper extremity assist Stand to Sit Details (indicate cue type and reason): Verbal cues for technique Stand Pivot Transfers: 4: Min assist Stand Pivot Transfer Details:  (stand-step transfer) Locomotion  Ambulation Ambulation: Yes Ambulation/Gait Assistance: 1: +2 Total assist Ambulation Distance (Feet): 118 Feet Assistive device: 2 person hand held assist Ambulation/Gait Assistance Details: Verbal cues for technique;Verbal cues for precautions/safety Ambulation/Gait Assistance Details (indicate cue type and reason): VCs for upright posture, wider stance; LLE adducts during swing phase High Level Ambulation High Level Ambulation: Side stepping Side Stepping: x3' to approach bed Stairs / Additional Locomotion Stairs: Yes Stairs Assistance: 4: Min assist Stairs Assistance Details: Verbal cues for sequencing (VCs for upright posture) Stair Management Technique: Two rails;Step to pattern Number of Stairs: 5  Height of Stairs: 6  Wheelchair Mobility Wheelchair Mobility: Yes Wheelchair Assistance: 4: Administrator, sports Details: Verbal cues for Diplomatic Services operational officer: Both upper extremities Distance: 60  Trunk/Postural Assessment  Cervical Assessment Cervical Assessment: Within Lobbyist Thoracic Assessment: Within Functional Limits Lumbar Assessment Lumbar Assessment: Exceptions to [facility] Lumbar AROM Overall Lumbar AROM: Due to precautions Postural Control Postural Control: Deficits on evaluation Postural Limitations: resistant to movement due to pain  Balance Balance Balance Assessed: Yes Static Sitting Balance Static Sitting - Balance Support: Feet supported;No upper extremity supported (pt resistance to removing UE support) Static Sitting - Level of Assistance: 5: Stand by assistance Dynamic Sitting Balance Dynamic Sitting - Level of Assistance: 5: Stand by  assistance Dynamic Standing Balance Dynamic Standing - Balance Support: No upper extremity supported (reached 1" forward to right or left,within base of support) Dynamic Standing - Level of Assistance: 4: Min assist Extremity Assessment  RUE Assessment RUE Assessment: Within Functional Limits LUE Assessment LUE Assessment: Within Functional Limits RLE Assessment RLE Assessment: Exceptions to [facility] RLE Strength RLE Overall Strength Comments: 5/5 strength grossly during open chain testing; however, pt exhibits decreased muscle endurance and power LLE Assessment LLE Assessment: Exceptions to [facility] LLE Strength LLE Overall Strength: Other (Comment) (5/5 grossly, but decreased endurance and power functionally)  Recommendations for other services: Other: none  Discharge Criteria: Patient will be discharged from PT if patient refuses treatment 3 consecutive times without  medical reason, if treatment goals not met, if there is a change in medical status, if patient makes no progress towards goals or if patient is discharged from hospital.  The above assessment, treatment plan, treatment alternatives and goals were discussed and mutually agreed upon: by family and patient.  Treatment after eval focused on gait training with RW, with verbal and visual cues for increasing stance width, upright posture, and forward gaze.    Porter Moes 01/16/2011, 12:12 PM

## 2011-01-16 NOTE — Progress Notes (Signed)
Occupational Therapy Assessment and Plan and First Treatment note  Patient Details  Name: Rodney [name] MRN: [id number] Date of Birth: December 04, 1940 Time: 9:00-10:00am Time calculation:  OT Diagnosis: acute pain and muscle weakness (generalized) Rehab Potential: Rehab Potential: Good ELOS: 7-10 days   Assessment & Plan Clinical Impression: Patient is a 70 y.o. year old male who developed acute low back pain with inability to stand on 01/01/11. Patient with history of prostate cancer, concerns about multiple myeloma with L3 and sacral lesions since 9/06 that had not significantly changed in the past. CT of spine done revealing L3 pathologic fracture. Evaluated by Dr. Arline Asp and Dr. Franky Macho. Patient underwent L3 corpectomy with vertebrectomy by Dr. Franky Macho on 01/09/11 and was on bedrest for 2 days. PT evaluation done and patient note to be limited by pain, weakness and decreased balance. MD, PT recommending CIR. Pt required 4u PRBC for post-operative anemia as well as a unit of FFP. Lesions were felt to be secondary to multiple myeloma. Perioperatively the patient's anticoagulation was reversed. After discussing the postoperative risk of bleeding and receiving consent from Dr. Franky Macho, the patient's anticoagulation was restarted. He was placed on a heparin drip. He was transitioned him to Coumadin and monitor INR and CBC closely . He was changed to lovenox for cross coverage prior to transfer. Anemia was felt to be due to chronic anemia from his multiple myeloma exacerbated by intra-operative blood loss. Fever resolved without any signs or symptoms of active infection. Course also complicated by orthostatic hypotension and constipation. BP responded to fluid resuscitation and bowel regimen. Patient transferred to CIR on 01/15/2011 .  Patient presents to CIR with generalized weakness, decreased standing dynamic standing balance, decreased knowledge of how to incorporate back precautions into ADLs,  acute pain, decreased activity tolerance.    Patient currently requires mod with basic self-care skills secondary to muscle weakness.  Prior to hospitalization, patient could complete ADL's Independently.   Patient will benefit from skilled intervention to increase independence with basic self-care skills prior to discharge home independently.  Anticipate patient may require HH OT  OT Assessment Rehab Potential: Good OT Plan OT Frequency: 1-2 X/day, 60-90 minutes Estimated Length of Stay: 7-10 days OT Treatment/Interventions: Balance/vestibular training;DME/adaptive equipment instruction;Community reintegration;Functional mobility training;Self Care/advanced ADL retraining;UE/LE Strength taining/ROM;Patient/family education;Therapeutic Activities;UE/LE Coordination activities  Precautions/Restrictions  Precautions Precautions: Back;Fall Spinal Brace: Lumbar corset;Applied in sitting position Restrictions Weight Bearing Restrictions: No General Chart Reviewed: Yes Family/Caregiver Present: Yes Vital Signs  No complaints of dizziness with standing Pain Pain Assessment Pain Assessment: No/denies pain Home Living/Prior Functioning Home Living Lives With: Spouse Receives Help From: Family Type of Home: Apartment Home Layout: Two level Home Access: Stairs to enter [employer] Shower/Tub: Engineer, manufacturing systems: Standard Home Adaptive Equipment: Long-handled sponge IADL History Current License: Yes Mode of Transportation: Car Prior Function Level of Independence: Independent with basic ADLs Driving: Yes ADL ADL Equipment Provided: Long-handled sponge Grooming: Setup;Minimal cueing Where Assessed-Grooming: Sitting at sink;Wheelchair Upper Body Bathing: Setup;Minimal cueing Where Assessed-Upper Body Bathing: Shower;Chair Lower Body Bathing: Minimal cueing;Minimal assistance (remained seated in the shower) Where Assessed-Lower Body Bathing: Chair;Shower Upper Body  Dressing: Minimal cueing;Supervision/safety Where Assessed-Upper Body Dressing: Chair (dressed on 3:1 in shower) Lower Body Dressing: Moderate assistance Where Assessed-Lower Body Dressing: Secondary school teacher: Minimal cueing;Minimal TEFL teacher Method: Warden/ranger:  (3:1) Vision/Perception  Vision - History Baseline Vision: No visual deficits Patient Visual Report: No change from baseline Vision - Assessment Eye Alignment: Within  Functional Limits Perception Perception: Within Functional Limits Praxis Praxis: Intact  Cognition Overall Cognitive Status: Appears within functional limits for tasks assessed Arousal/Alertness: Awake/alert Orientation Level: Oriented X4 Attention: Alternating;Selective;Sustained;Focused Focused Attention: Appears intact Sustained Attention: Appears intact Selective Attention: Appears intact Alternating Attention: Appears intact Memory: Appears intact Awareness: Appears intact Problem Solving: Impaired Problem Solving Impairment: Functional basic (new learning) Safety/Judgment: Appears intact Sensation Sensation Light Touch: Appears Intact Stereognosis: Appears Intact Hot/Cold: Appears Intact Proprioception: Appears Intact Coordination Gross Motor Movements are Fluid and Coordinated: Yes Fine Motor Movements are Fluid and Coordinated: Yes Finger Nose Finger Test: [facility] Motor  Motor Motor: Within Functional Limits Motor - Skilled Clinical Observations: generalized weakness Mobility  Bed Mobility Bed Mobility: Yes Rolling Right: 4: Min assist Rolling Right Details: Tactile cues for sequencing;Tactile cues for placement Right Sidelying to Sit: 3: Mod assist Right Sidelying to Sit Details: Tactile cues for sequencing;Tactile cues for placement Transfers Sit to Stand: 3: Mod assist Sit to Stand Details: Tactile cues for sequencing;Tactile cues for placement;Tactile cues for  posture Stand to Sit: 4: Min assist Stand to Sit Details (indicate cue type and reason): Tactile cues for sequencing;Tactile cues for placement;Tactile cues for posture  Trunk/Postural Assessment  Cervical Assessment Cervical Assessment: Within Functional Limits Thoracic Assessment Thoracic Assessment: Within Functional Limits Lumbar Assessment Lumbar Assessment: Within Functional Limits Postural Control Postural Control: Within Functional Limits  Balance Balance Balance Assessed: Yes Dynamic Standing Balance Dynamic Standing - Level of Assistance: 3: Mod assist Extremity/Trunk Assessment RUE Assessment RUE Assessment: Within Functional Limits LUE Assessment LUE Assessment: Within Functional Limits  Recommendations for other services: Other: NONE  Discharge Criteria: Patient will be discharged from OT if patient refuses treatment 3 consecutive times without medical reason, if treatment goals not met, if there is a change in medical status, if patient makes no progress towards goals or if patient is discharged from hospital.  The above assessment, treatment plan, treatment alternatives and goals were discussed and mutually agreed upon: by family and patient.  Treatment Note:  1:1 session: OT eval and began self care retraining at shower level.  Pt's wife present for session.  Education provided on back precautions with ADLs including bed mobility.  Transfers only this am secondary to report of orthostasis. Sat on 3:1in shower and remained seated without his corset on; dressed UB including corset in shower to then perform stand pivot to w/c. Focused on sit to stand and stand to sit with control and maintaining an upright posture. Used a long handled brush for bathing LB. Educated on OT purpose, role and goals.  Melonie Florida 01/16/2011, 10:34 AM

## 2011-01-16 NOTE — Progress Notes (Signed)
Physical Therapy Session Note  Patient Details  Name: Rodney [name] MRN: [id number] Date of Birth: October 04, 1940  Today's Date: 01/16/2011 Time: 1420-1500 Time Calculation (min): 40 min  Precautions: Precautions Precautions: Back Required Braces or Orthoses: Yes Spinal Brace: Lumbar corset Restrictions Weight Bearing Restrictions: No      Genera:l chart reviewed.   Pain Pain Assessment Pain Assessment: 0-10 Pain Score:   7 Pain Type: Surgical pain;Chronic pain Pain Location: Back (also R sacrum due to lesion) Pain Intervention(s): RN made aware      Exercises: On Nu Step, at level 5, x 5 minutes, rated 12 on BORG scale. Mobility: bed mobility with S; transfers with RW after donning LO, with min A.  Other:  Pt reports pain over surgical site due to fluid pocket irritated by LO; RN aware.  Peyton Najjar of [employer] examined pt and LO, and will add lambswool strips to LO this evening to relieve pressure over fluid pocket noted above incision.  Hand-out provided for back precautions.     Therapy/Group: Individual Therapy  Matelyn Antonelli 01/16/2011, 4:55 PM

## 2011-01-16 NOTE — Progress Notes (Signed)
ANTICOAGULATION CONSULT NOTE - Follow Up Consult  Pharmacy Consult:  Coumadin Indication: h/o PE  No Known Allergies  Patient Measurements: Height: 5\' 8"  (172.7 cm) Weight: 218 lb (98.884 kg) IBW/kg (Calculated) : 68.4    Vital Signs: Temp: 97.8 F (36.6 C) (11/13 0550) Temp src: Oral (11/13 0550) BP: 94/62 mmHg (11/13 0550) Pulse Rate: 60  (11/13 0550)  Labs:  Basename 01/16/11 0635 01/15/11 1636 01/15/11 0630 01/14/11 0911 01/14/11 0835  HGB 9.1* -- 9.0* -- --  HCT 27.8* -- 26.9* -- 26.5*  PLT 381 -- 350 -- 337  APTT -- -- -- -- --  LABPROT 20.5* 20.1* 17.6* -- --  INR 1.72* 1.68* 1.42 -- --  HEPARINUNFRC -- -- -- -- --  CREATININE 0.80 -- -- 0.71 --  CKTOTAL -- -- -- -- --  CKMB -- -- -- -- --  TROPONINI -- -- -- -- --   Estimated Creatinine Clearance: 100.8 ml/min (by C-G formula based on Cr of 0.8).    Assessment: 70 YOM on Coumadin and Lovenox bridge for h/o PE.  INR subtherapeutic.  Noted pt started on Diflucan; therefore, will reduce dose today.  Goal of Therapy:  INR 2-3    Plan:  1.  Coumadin 5mg  PO today. 2.  Continue Lovenox until INR therapeutic. 3.  Daily PT/INR.  Rodney [name] 01/16/2011,8:30 AM

## 2011-01-16 NOTE — Progress Notes (Signed)
Subjective/Complaints: Review of Systems  Musculoskeletal: Positive for back pain.  All other systems reviewed and are negative.  a little difficulty with sleep last noc   Objective: Vital Signs: Blood pressure 94/62, pulse 60, temperature 97.8 F (36.6 C), temperature source Oral, resp. rate 20, height 5\' 8"  (1.727 m), weight 98.884 kg (218 lb), SpO2 92.00%. No results found.  Basename 01/16/11 0635 01/15/11 0630  WBC 12.2* 11.0*  HGB 9.1* 9.0*  HCT 27.8* 26.9*  PLT 381 350    Basename 01/14/11 0911  NA 138  K 3.5  CL 105  CO2 23  GLUCOSE 109*  BUN 11  CREATININE 0.71  CALCIUM 8.3*   CBG (last 3)  No results found for this basename: GLUCAP:3 in the last 72 hours  Wt Readings from Last 3 Encounters:  01/15/11 98.884 kg (218 lb)  01/07/11 98.9 kg (218 lb 0.6 oz)  01/07/11 98.9 kg (218 lb 0.6 oz)    Physical Exam:  General appearance: alert Head: Normocephalic, without obvious abnormality, atraumatic Eyes: conjunctivae/corneas clear. PERRL, EOM's intact. Fundi benign. Ears: normal TM's and external ear canals both ears Nose: Nares normal. Septum midline. Mucosa normal. No drainage or sinus tenderness. Throat: lips, mucosa, and tongue normal; teeth and gums normal Neck: no adenopathy, no carotid bruit, no JVD, supple, symmetrical, trachea midline and thyroid not enlarged, symmetric, no tenderness/mass/nodules Back: symmetric, no curvature. ROM normal. No CVA tenderness. Resp: clear to auscultation bilaterally Cardio: regular rate and rhythm, S1, S2 normal, no murmur, click, rub or gallop GI: soft, non-tender; bowel sounds normal; no masses,  no organomegaly Extremities: extremities normal, atraumatic, no cyanosis or edema Pulses: 2+ and symmetric Skin: Skin color, texture, turgor normal. No rashes or lesions Neurologic: Grossly normal pain inhibition weakness in proximal limbs.  No focal motor or sensory loss. Cognition improving. Incision/Wound: wound clean and  intact.  Back is tender with touch and minimal movement.   Assessment/Plan: 1. Functional deficits secondary to pathologic L3 fx, s/p corpectomy/vertebrectomy which require 3+ hours per day of interdisciplinary therapy in a comprehensive inpatient rehab setting. Physiatrist is providing close team supervision and 24 hour management of active medical problems listed below. Physiatrist and rehab team continue to assess barriers to discharge/monitor patient progress toward functional and medical goals  evals today  Mobility:         ADL:   Cognition: Cognition Orientation Level: Oriented X4 Cognition Orientation Level: Oriented X4  1.  DVT Prophylaxis/Anticoagulation: Mechanical:  Antiembolism stockings, knee (TED hose) Bilateral lower extremities and lovenox to coumadin  2. Pain Management: Tylenol, robaxin and oxycodone. Patient prefers to use minimal pain medication over fears of addiction. I encouraged the patient to utilize pain medication as needed given his history and pain symptoms.   3. Mood: Team will provide ego support.  Patient having some issues coping with this hospitalization and medical situation.  4. Constipation: Scheduled Senokot-S with as needed MiraLAX and Dulcolax suppository.   5. multiple myeloma: Workup per Dr. Arline Asp. Will discuss treatment plan although I suspect treatment will begin after discharge from rehabilitation.   6. history of orthostasis: Push by mouth fluids. TED stockings will be used as well.   7. anemia: This is due to his postoperative blood loss as well as his multiple myeloma. Iron supplementation daily. Check serial CBCs. We'll transfuse patient as appropriate. Encouraged dietary intake of iron. Hgb holding stable at present  8. Leukocytosis:  Afebrile. Wound clean.  Check urine.  Encourage IS     ELOS (  Days) 1  SWARTZ,Rodney [name] 01/16/2011, 7:08 AM

## 2011-01-16 NOTE — Progress Notes (Signed)
Social Work Assessment and Plan Assessment and Plan  Patient Name: Rodney [name]  TZGYF'V Date: 01/16/2011  Problem List:  Patient Active Problem List  Diagnoses  . ESOPHAGEAL REFLUX  . ACUT DUOD ULCER W/O MENTION HEMORR/PERF W/OBST  . INGUINAL HERNIA  . PANCREATIC STEATORRHEA  . Metastatic multiple myeloma to bone  . PROSTATE CANCER, HX OF  . PULMONARY EMBOLISM, HX OF  . PANCREATITIS, HX OF  . GASTRITIS, ACUTE  . Clotting disorder  . Pathologic fracture of vertebra    Past Medical History:  Past Medical History  Diagnosis Date  . Pulmonary embolism   . Prostate cancer   . Pancreatitis   . Multiple myeloma   . Pulmonary embolism   . Degenerative disc disease     Past Surgical History:  Past Surgical History  Procedure Date  . Cholecystectomy 04/06/2010  . Back surgery     Discharge Planning  Discharge Planning Living Arrangements: Spouse/significant other Support Systems: Spouse/significant other;Children;Church/faith community;Friends/neighbors Assistance Needed: Pt will require assist with home management. Wife can provide this.. pt very independent and active Do you have any problems obtaining your medications?: No Type of Residence: Other (Comment) (Apartment) Home Care Services: No Patient expects to be discharged to:: Home with wife to assist, but plans to be independent level Expected Discharge Date: 01/24/11 Case Management Consult Needed: Yes (Comment) (RNCM already following)  Social/Family/Support Systems Social/Family/Support Systems Patient Roles: Spouse;Parent;Other (Comment) (Employee, churchmember,etc) Contact Information: Zadrian Mccauley wife [phone]  Noelle Penner daughter [phone] Anticipated Caregiver: wife Anticipated Caregiver's Contact Information: see above Ability/Limitations of Caregiver: no limitations Caregiver Availability: 24/7  Employment Status Employment Status Employment Status: Employed Name of  Employer: [employer] Return to Work Plans: 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 He is motivated to improve and deal with whatever comes his way. Recent Psychosocial Issues: Question if he has multiple myeloma. Will have follow up for this to confirm diagnosis Pyschiatric History: No history, deferred BDI due to pt's coping seems to be appropriate and will continue to monitor  Patient/Family Perceptions, Expectations & Goals Pt/Family Perceptions, Expectations and Goals Pt/Family understanding of illness & functional limitations: Pt has a good understanding of his back precautions and medical issues.  He wants to be very involved in his care and decisions. Premorbid pt/family roles/activities: Husband, Mormon, Architectural technologist, employee, Retired Hotel manager, etc Anticipated changes in roles/activities/participation: Plans to resume these roles Pt/family expectations/goals: Pt plans to be independent level by discharge. He states" I will do what is required of me and excell in this therapy program"  He approaches challenges much like a colonel in the [employer]: None Premorbid Home Care/DME Agencies: None Transportation available at discharge: Family Resource referrals recommended: Support group (specify) (Cancer Support Group, make aware of)  Discharge Assessment Discharge Planning Insurance Resources: [employer] Financial Resources: Employment;Social Security Financial Screen Referred: No Living Expenses: Rent Money Management: Patient;Spouse Home Management: Both wife does the cooking Patient/Family Preliminary Plans: Return home with wife assisting him if necessary  Clinical Impression:  Pt  has a strong man with a strong conviction and utilizes his Insurance claims handler in dealing with challenges in his life. He should do well and be here a short period of time. Will continue to monitor his coping, he has A lot  to deal with.       Lucy Chris 01/16/2011

## 2011-01-16 NOTE — Progress Notes (Signed)
Occupational Therapy Session Note  Patient Details  Name: SHAHZAD THOMANN MRN: [id number] Date of Birth: 1940-05-25  Today's Date: 01/16/2011 Time: 1420-1500 Time Calculation (min): 40 min  Precautions: Precautions Precautions: Back;Fall Required Braces or Orthoses: Yes Spinal Brace: Applied in sitting position Restrictions Weight Bearing Restrictions: No  Short Term Goals: OT Short Term Goal 1: Pt will don LB clothing with AE prn sit to stand with supervision. OT Short Term Goal 2: Pt will perform toileting with distand supervision. OT Short Term Goal 3: Pt will transfer into tub/shower by stepping over ledge of tub with min A. OT Short Term Goal 4: Pt will tolerate standing for all grooming tasks at sink with supervision.  Skilled Therapeutic Interventions/Progress Updates:  Skilled intervention focusing on donning/doffing lumbar corset, functional ambulation/mobility using rolling walker, body mechanics of sit to stands & stand to sits, simulated tub/shower transfer using tub transfer bench. Wife present for entire session for verbal education. Patient and wife need reinforcement.   Pain  Patient with 4/10 pain at beginning of session; 6/10 at end of session. RN aware  Therapy/Group: Individual Therapy  Leobardo Granlund 01/16/2011, 3:41 PM

## 2011-01-17 LAB — URINE CULTURE

## 2011-01-17 LAB — PROTIME-INR: Prothrombin Time: 23.4 seconds — ABNORMAL HIGH (ref 11.6–15.2)

## 2011-01-17 MED ORDER — FLUCONAZOLE 100 MG PO TABS
100.0000 mg | ORAL_TABLET | Freq: Every day | ORAL | Status: AC
  Administered 2011-01-17 – 2011-01-19 (×3): 100 mg via ORAL
  Filled 2011-01-17 (×4): qty 1

## 2011-01-17 MED ORDER — WARFARIN SODIUM 5 MG PO TABS
5.0000 mg | ORAL_TABLET | Freq: Once | ORAL | Status: AC
  Administered 2011-01-17: 5 mg via ORAL
  Filled 2011-01-17: qty 1

## 2011-01-17 NOTE — Progress Notes (Signed)
Pt is alert and oriented,  Pt ambulates with walker and a minimal assist.  Pt continent of urine,  Pt had large bowel movement in bathroom toilet and was able to perform toilet hygiene.  Pt was re-educated on safety and calling for assist when getting out of bed,  Pt did transfer self from wheelchair to bed.  After education pt verbalized an understanding.  Pain managed with current medication regimen Nemiah Commander

## 2011-01-17 NOTE — Progress Notes (Signed)
Subjective/Complaints: Review of Systems  Musculoskeletal: Positive for back pain.  All other systems reviewed and are negative.  a little difficulty with sleep last noc   Objective: Vital Signs: Blood pressure 91/62, pulse 61, temperature 97.6 F (36.4 C), temperature source Oral, resp. rate 19, height 5\' 8"  (1.727 m), weight 98.884 kg (218 lb), SpO2 93.00%. No results found.  Basename 01/16/11 0635 01/15/11 0630  WBC 12.2* 11.0*  HGB 9.1* 9.0*  HCT 27.8* 26.9*  PLT 381 350    Basename 01/16/11 0635 01/14/11 0911  NA 137 138  K 3.7 3.5  CL 105 105  CO2 22 23  GLUCOSE 107* 109*  BUN 11 11  CREATININE 0.80 0.71  CALCIUM 8.6 8.3*   CBG (last 3)  No results found for this basename: GLUCAP:3 in the last 72 hours  Wt Readings from Last 3 Encounters:  01/15/11 98.884 kg (218 lb)  01/07/11 98.9 kg (218 lb 0.6 oz)  01/07/11 98.9 kg (218 lb 0.6 oz)    Physical Exam:  General appearance: alert Head: Normocephalic, without obvious abnormality, atraumatic Eyes: conjunctivae/corneas clear. PERRL, EOM's intact. Fundi benign. Ears: normal TM's and external ear canals both ears Nose: Nares normal. Septum midline. Mucosa normal. No drainage or sinus tenderness. Throat: lips, mucosa, and tongue normal; teeth and gums normal Neck: no adenopathy, no carotid bruit, no JVD, supple, symmetrical, trachea midline and thyroid not enlarged, symmetric, no tenderness/mass/nodules Back: symmetric, no curvature. ROM normal. No CVA tenderness. Resp: clear to auscultation bilaterally Cardio: regular rate and rhythm, S1, S2 normal, no murmur, click, rub or gallop GI: soft, non-tender; bowel sounds normal; no masses,  no organomegaly Extremities: extremities normal, atraumatic, no cyanosis or edema Pulses: 2+ and symmetric Skin: Skin color, texture, turgor normal. No rashes or lesions Neurologic: Grossly normal pain inhibition weakness in proximal limbs.  No focal motor or sensory loss.  Cognition improving. Incision/Wound: wound clean and intact.  Back is tender with touch and minimal movement.   Assessment/Plan: 1. Functional deficits secondary to pathologic L3 fx, s/p corpectomy/vertebrectomy which require 3+ hours per day of interdisciplinary therapy in a comprehensive inpatient rehab setting. Physiatrist is providing close team supervision and 24 hour management of active medical problems listed below. Physiatrist and rehab team continue to assess barriers to discharge/monitor patient progress toward functional and medical goals  Complete IPOC  Mobility: Bed Mobility Bed Mobility: Yes Rolling Right: 5: Supervision Right Sidelying to Sit: 5: Supervision Sitting - Scoot to Edge of Bed: 5: Supervision Sit to Supine - Left: 5: Supervision Transfers Sit to Stand: 4: Min assist;With upper extremity assist;With armrests Stand to Sit: 4: Min assist;With upper extremity assist Stand Pivot Transfers: 4: Min assist Ambulation/Gait Ambulation/Gait Assistance: 3: Mod assist Ambulation/Gait Assistance Details (indicate cue type and reason): VCs for upright posture, wider stance; LLE adducts during swing phase Ambulation Distance (Feet): 118 Feet Assistive device: 2 person hand held assist Stairs: Yes Stairs Assistance: 4: Min assist Stair Management Technique: Two rails;Step to pattern Number of Stairs: 5  Height of Stairs: 6  Wheelchair Mobility Wheelchair Mobility: Yes Wheelchair Assistance: 4: Systems analyst: Both upper extremities Distance: 60 ADL:   Cognition: Cognition Overall Cognitive Status: Appears within functional limits for tasks assessed Arousal/Alertness: Awake/alert Orientation Level: Oriented X4 Attention: Divided (stopped walking when answering a question) Focused Attention: Appears intact Sustained Attention: Appears intact Selective Attention: Appears intact Alternating Attention: Appears intact Memory: Impaired (repeated  stories of military service 2x in 15 min) Awareness: Appears intact  Problem Solving: Impaired Problem Solving Impairment: Functional basic (new learning) Behaviors: Lability (tearful during eval) Safety/Judgment: Appears intact Cognition Arousal/Alertness: Awake/alert Orientation Level: Oriented X4  1.  DVT Prophylaxis/Anticoagulation: Mechanical:  Antiembolism stockings, knee (TED hose) Bilateral lower extremities and lovenox to coumadin. INR is approaching 2.0  2. Pain Management: Tylenol, robaxin and oxycodone. Patient prefers to use minimal pain medication over fears of addiction. I encouraged the patient to utilize pain medication as needed given his history and pain symptoms.  Patient seems to have done fairly well with this so far.  3. Mood: Team will provide ego support.  Patient having some issues coping with this hospitalization and medical situation.  4. Constipation: Scheduled Senokot-S with as needed MiraLAX and Dulcolax suppository.   5. multiple myeloma: Workup per Dr. Arline Asp. Will discuss treatment plan although I suspect treatment will begin after discharge from rehabilitation.   6. history of orthostasis: Push by mouth fluids. TED stockings will be used as well.   7. anemia: This is due to his postoperative blood loss as well as his multiple myeloma. Iron supplementation daily. Check serial CBCs. We'll transfuse patient as appropriate. Encouraged dietary intake of iron. Hgb holding stable at present  8. Leukocytosis:  Afebrile. Wound clean.  Urine cx negative.  Encourage IS.       ELOS (Days) 2  Zekiah Coen T 01/17/2011, 6:55 AM

## 2011-01-17 NOTE — Progress Notes (Signed)
Occupational Therapy Session Note  Patient Details  Name: Rodney [name] MRN: [id number] Date of Birth: March 01, 1941  Today's Date: 01/17/2011 Time: 1191-4782 Time Calculation (min): 45 min  Precautions: Precautions Precautions: Back Required Braces or Orthoses: Yes Spinal Brace: Lumbar corset Restrictions Weight Bearing Restrictions: No  Short Term Goals: OT Short Term Goal 1: Pt will don LB clothing with AE prn sit to stand with supervision. OT Short Term Goal 2: Pt will perform toileting with distand supervision. OT Short Term Goal 3: Pt will transfer into tub/shower by stepping over ledge of tub with min A. OT Short Term Goal 4: Pt will tolerate standing for all grooming tasks at sink with supervision.  Skilled Therapeutic Interventions/Progress Updates:    ADL retraining including dressing sitting EOB to focus on dynamic sitting balance to increase mod I with ADLS.  Pt Supervision for upper and lower body dressing with min verbal cues for safety with back precautions to wear brace.  Pt able to complete task with reacher, and sock aid to don/doff clothing due to precautions.  Pt c/o more pain with activity, by end of session 6/10.          Pain Pain Assessment Pain Score:   6 ADL   Therapy/Group: Individual Therapy  Janett Billow 01/17/2011, 10:48 AM

## 2011-01-17 NOTE — Progress Notes (Signed)
Physical Therapy Session Note  Patient Details  Name: Rodney [name] MRN: [id number] Date of Birth: 30-Jul-1940  Today's Date: 01/17/2011 Time: 1416-1500 Time Calculation (min): 44 min  Precautions: Precautions Precautions: Back Required Braces or Orthoses: Yes Spinal Brace: Lumbar corset Restrictions Weight Bearing Restrictions: No     Skilled Therapeutic Interventions: gait training over carpet x 54' , x 150' with RW with S.  Therex for LE strengthening: alternating foot taps onto 2" stool, focusing on hip abduction with flexion, ankle DF and toe ext to clear feet for wider base of support, 2 x 10 reps; calf raises with knees extended and slightly flexed, 1x 10 each; squats 1 x 10 ; all with bilateral UE support at table or rail. Pt required VCS for back precautions x 2, when turning to talk to someone slightly behind him, while walking in hall.  Pt continues to be quite verbose, internally distracting himself and veering off-topic, often.     General: chart reviewed     Pain Pain Assessment Pain Score:   6/10 at start of tx; 8/10 at end of session; pt premedicated before tx; pain LB.  Pt stated that the modification to his lumbar corset has decreased irritation over the fluid pocket just superior to surgical incision.  Mobility Bed Mobility Sit to Supine - Left: 5: Supervision Transfers Transfers: Yes Sit to Stand: 5: Supervision Stand to Sit: 5: Supervision   Trunk/Postural Assessment : pt tends to walk flightly forward flexed.  When attempting to fully extend knees in standing, forward flexion increased.         Therapy/Group: Individual Therapy  [name] 01/17/2011, 3:22 PM

## 2011-01-17 NOTE — Progress Notes (Signed)
Occupational Therapy Session Note  Patient Details  Name: Rodney [name] MRN: [id number] Date of Birth: 01/06/1941  Today's Date: 01/17/2011 Time: 8657-8469 Time Calculation (min): 45 min  Precautions: Precautions Precautions: Back Required Braces or Orthoses: Yes Spinal Brace: Lumbar corset Restrictions Weight Bearing Restrictions: No  Short Term Goals: OT Short Term Goal 1: Pt will don LB clothing with AE prn sit to stand with supervision. OT Short Term Goal 2: Pt will perform toileting with distand supervision. OT Short Term Goal 3: Pt will transfer into tub/shower by stepping over ledge of tub with min A. OT Short Term Goal 4: Pt will tolerate standing for all grooming tasks at sink with supervision.  Skilled Therapeutic Interventions/Progress Updates:    Pt demonstrated tub/shower bench transfer in/out tub with min verbal cues for proper body positioning due to back precautions.   Therapist went over safety with r/w with reaching, and picking up items with reacher for home mangament tasks.  Focus on safety awareness for back precautions to increase independence with ADLS.        Pain Pain Assessment Pain Assessment: 0-10 (c/o 5/10 with activity) Pain Score:  5 Pain Type: Acute pain Pain Location: Back    Therapy/Group: Individual Therapy  Janett Billow 01/17/2011, 3:35 PM

## 2011-01-17 NOTE — Progress Notes (Signed)
[facility] Individual Statement of Services  Patient Name:  Rodney [name]  Date:  01/17/2011  Welcome to the [facility].  Our goal is to provide you with an individualized program based on your diagnosis and situation, designed to meet your specific needs.  With this comprehensive rehabilitation program, you will be expected to participate in at least 3 hours of rehabilitation therapies Monday-Friday, with modified therapy programming on the weekends.  Your rehabilitation program will include the following services:  Physical Therapy (PT), Occupational Therapy (OT), 24 hour per day rehabilitation nursing, Case Management (RN and Child psychotherapist), Rehabilitation Medicine, Nutrition Services and Pharmacy Services  Estimated Length of Stay: 7-10 days    Overall Predicted Outcome: Modified Independent  Weekly team conferences will be held on Tuesdays to discuss your progress.  Your RN Case Designer, television/film set will talk with you frequently to get your input and to update you on team discussions.  Team conferences with you and your family in attendance may also be held.  Depending on your progress and recovery, your program may change.  Your RN Case Estate agent will coordinate services and will keep you informed of any changes.  Your RN Sports coach and SW names and contact numbers are listed  below.  The following services may also be recommended but are not provided by the [facility]:   Driving Evaluations  Home Health Rehabiltiation Services  Outpatient Rehabilitatation [facility]  Vocational Rehabilitation   Arrangements will be made to provide these services after discharge if needed.  Arrangements include referral to agencies that provide these services.  Your insurance has been verified to be:  Medicare Your primary doctor is:  Dr. Darryll Capers  Pertinent information will be shared with your doctor and  your insurance company.  Case Manager: Lutricia Horsfall, [facility] [phone]  Social Worker:  Dossie Der, [place] [phone]  Information discussed with and copy given to patient by: Meryl Dare, 01/17/2011, 9:51 AM

## 2011-01-17 NOTE — Plan of Care (Signed)
Problem: Consults Goal: Skin Care Protocol Initiated - if indicated If consults are not indicated, leave blank or document N/A  Outcome: Progressing Remain free of skin breakdown during admission

## 2011-01-17 NOTE — Patient Care Conference (Signed)
Inpatient RehabilitationTeam Conference Note Date: 01/16/11   Time: 1:25 PM    Patient Name: Rodney [name]      Medical Record Number: [id number]  Date of Birth: [birth year] Sex: Male         Room/Bed: 4004/4004-01 Payor Info: Payor: MEDICARE  Plan: MEDICARE PART A AND B  Product Type: *No Product type*     Admitting Diagnosis: L3 VERTEBRAL FX  Admit Date/Time:  01/15/2011  3:11 PM Admission Comments: No comment available   Primary Diagnosis:  Pathologic fracture of vertebra Principal Problem: Pathologic fracture of vertebra  Patient Active Problem List  Diagnoses Date Noted  . Pathologic fracture of vertebra 01/16/2011  . Clotting disorder 08/01/2010  . GASTRITIS, ACUTE 04/08/2010  . INGUINAL HERNIA 10/13/2008  . ESOPHAGEAL REFLUX 05/31/2008  . Metastatic multiple myeloma to bone 02/02/2008  . ACUT DUOD ULCER W/O MENTION HEMORR/PERF W/OBST 01/05/2008  . PANCREATIC STEATORRHEA 01/05/2008  . PROSTATE CANCER, HX OF 01/05/2008  . PULMONARY EMBOLISM, HX OF 01/05/2008  . PANCREATITIS, HX OF 01/05/2008    Expected Discharge Date: Expected Discharge Date: 01/24/11  Team Members Present: Physician: Dr. Claudette Laws Case Manager Present: Lutricia Horsfall, RN Social Worker Present: Dossie Der, LCSW PT Present: Vincente Liberty, PTA;Elvia Collum, PT OT Present: Edwin Cap, OT RN present: Rosalio Macadamia, RN     Current Status/Progress Goal Weekly Team Focus  Medical   Multiple myeloma with bony disease leading to L3 fracture. Patient status post vertebrectomy and corpectomy. Mental status improving.  Surgical stability and pain control  Pain control. Patient needs to be  educated regarding   Bowel/Bladder   conitinent bowel and bladder constipation  taking miralax   bm every  other day   monitor effectiveness of scheduled meds   Swallow/Nutrition/ Hydration             ADL's   min to mod A  mod I  safety with back precautions   Mobility              Communication             Safety/Cognition/ Behavioral Observations            Pain   oxycodone  5-10mg   prn   keep pain level less than 3 on scale 0-10  monitor effectiveness of pain meds   Skin   lumbar incision  with dermabond and left flank incision with steristirps intact   no new breakdown   monitor incision every day       *See Interdisciplinary Assessment and Plan and progress notes for long and short-term goals  Barriers to Discharge: Pain control    Possible Resolutions to Barriers:  Further education as above. Therapy team to work on patient with proper technique brace wear etc. appear    Discharge Planning/Teaching Needs:  Home with wife who can assist if necessary. Pt plans to be independent level by discharge      Team Discussion:   Discussion of dx, goals, d/c plan. Mod I goals. D/C date: 01/24/11.  Revisions to Treatment Plan:  none   Continued Need for Acute Rehabilitation Level of Care: The patient requires daily medical management by a physician with specialized training in physical medicine and rehabilitation for the following conditions: Daily direction of a multidisciplinary physical rehabilitation program to ensure safe treatment while eliciting the highest outcome that is of practical value to the patient.: Yes Daily medical management of patient stability for increased activity during participation in an intensive rehabilitation  regime.: Yes Daily analysis of laboratory values and/or radiology reports with any subsequent need for medication adjustment of medical intervention for : Post surgical problems;Neurological problems  Rodney [name] 01/17/2011, 9:42 AM

## 2011-01-17 NOTE — Progress Notes (Signed)
Physical Therapy Session Note  Patient Details  Name: Rodney [name] MRN: [id number] Date of Birth: Sep 07, 1940  Today's Date: 01/17/2011 Time: 2725-3664 Time Calculation (min): 45 min  Precautions: Precautions Precautions: Back Required Braces or Orthoses: Yes Spinal Brace: Lumbar corset Restrictions Weight Bearing Restrictions: No  Short Term Goals:    Skilled Therapeutic Interventions/Progress Updates:      General   Vital Signs   Pain Pain Assessment Pain Assessment: 0-10 (6) Pain Score:   9 Pain Type: Surgical pain Pain Location: Back Pain Descriptors: Sharp Pain Onset: With Activity Pain Intervention(s): RN made aware;Repositioned Mobility Bed Mobility Rolling Right: 5: Supervision Rolling Right Details (indicate cue type and reason): pt demonstrated logroll without cues Left Sidelying to Sit: 5: Supervision Sit to Supine - Left: 5: Supervision Transfers Transfers: Yes Sit to Stand: 5: Supervision Stand to Sit: 5: Supervision Locomotion  Ambulation Ambulation: Yes Ambulation/Gait Assistance: 4: Min assist Ambulation Distance (Feet): 150 Feet (x 2) Assistive device: Rolling walker Ambulation/Gait Assistance Details (indicate cue type and reason): performed short distance without RW.  Pt. tended to furinture walk with an increase in pain Gait Gait: Yes Gait Pattern: Step-through pattern;Trunk flexed Gait velocity: WFL Stairs / Additional Locomotion Curb: 4: Min assist (VC for RW placement) Wheelchair Mobility Wheelchair Mobility: No  Trunk/Postural Assessment     Balance Balance Balance Assessed: Yes Dynamic Standing Balance Dynamic Standing - Balance Activities:  (Rhomberg eyes open, closed, staggard stance.) Dynamic Standing - Comments: unstable only with staggard stance   Exercises General Exercises - Lower Extremity Hip ABduction/ADduction: AROM;Strengthening;20 reps;Both;Standing Hip Flexion/Marching:  Both;Strengthening;Standing Toe Raises: Strengthening;Both;20 reps;Standing Heel Raises: Strengthening;Both;Standing Mini-Sqauts: Strengthening;Both;20 reps;Standing  Other Treatments    Therapy/Group: Individual Therapy  Julian Reil 01/17/2011, 12:21 PM

## 2011-01-17 NOTE — Progress Notes (Signed)
Team Conference Follow-Up Team Conference discussion was reviewed with the patient and daughter, including goals and target discharge date. Both express understanding and are in agreement  with target discharge date of 01/24/11.

## 2011-01-17 NOTE — Progress Notes (Signed)
ANTICOAGULATION CONSULT NOTE - Follow Up Consult  Pharmacy Consult:  Coumadin Indication:  H/o PE  No Known Allergies  Patient Measurements: Height: 5\' 8"  (172.7 cm) Weight: 218 lb (98.884 kg) IBW/kg (Calculated) : 68.4    Vital Signs: Temp: 97.6 F (36.4 C) (11/14 0545) Temp src: Oral (11/14 0545) BP: 91/62 mmHg (11/14 0545) Pulse Rate: 61  (11/14 0545)  Labs:  Basename 01/17/11 0640 01/16/11 0635 01/15/11 1636 01/15/11 0630  HGB -- 9.1* -- 9.0*  HCT -- 27.8* -- 26.9*  PLT -- 381 -- 350  APTT -- -- -- --  LABPROT 23.4* 20.5* 20.1* --  INR 2.04* 1.72* 1.68* --  HEPARINUNFRC -- -- -- --  CREATININE -- 0.80 -- --  CKTOTAL -- -- -- --  CKMB -- -- -- --  TROPONINI -- -- -- --   Estimated Creatinine Clearance: 100.8 ml/min (by C-G formula based on Cr of 0.8).     Assessment: 25 YOM on Coumadin for h/o PE in May 2012.  INR therapeutic today.  Noted pt on diflucan.  Goal of Therapy:  INR 2-3   Plan:  1.  Coumadin 5mg  PO today. 2.  D/C Lovenox as INR therapeutic. 3.  Daily PT/INR for now.   [name] 01/17/2011,9:19 AM

## 2011-01-18 LAB — CBC
Hemoglobin: 9.4 g/dL — ABNORMAL LOW (ref 13.0–17.0)
MCHC: 31 g/dL (ref 30.0–36.0)
RDW: 15.3 % (ref 11.5–15.5)
WBC: 11.9 10*3/uL — ABNORMAL HIGH (ref 4.0–10.5)

## 2011-01-18 LAB — PROTIME-INR
INR: 2.27 — ABNORMAL HIGH (ref 0.00–1.49)
Prothrombin Time: 25.4 seconds — ABNORMAL HIGH (ref 11.6–15.2)

## 2011-01-18 MED ORDER — WARFARIN SODIUM 5 MG PO TABS
5.0000 mg | ORAL_TABLET | Freq: Once | ORAL | Status: AC
  Administered 2011-01-18: 5 mg via ORAL
  Filled 2011-01-18: qty 1

## 2011-01-18 NOTE — Plan of Care (Signed)
Problem: SCI BOWEL ELIMINATION Goal: RH STG SCI MANAGE BOWEL WITH MEDICATION WITH ASSISTANCE Outcome: Progressing Patient able to manage bowel medication by self with minimum assistance from caregiver Goal: RH OTHER STG BOWEL ELIMINATION GOALS W/ASSIST Outcome: Progressing Pt will manage bowel eliminations at home by self with minimal assistance  Problem: RH SKIN INTEGRITY Goal: RH STG SKIN FREE OF INFECTION/BREAKDOWN Outcome: Progressing Skin will remain free of infection

## 2011-01-18 NOTE — Progress Notes (Signed)
Physical Therapy Session Note  Patient Details  Name: Rodney [name] MRN: [id number] Date of Birth: Dec 03, 1940  Today's Date: 01/18/2011 Time: 0930-1030 Time Calculation (min): 60 min  Precautions: Precautions Precautions: Back Required Braces or Orthoses: Yes Spinal Brace: Lumbar corset Restrictions Weight Bearing Restrictions: No  Short Term Goals:    Skilled Therapeutic Interventions/Progress Updates:      General   Vital Signs   Pain Pain Assessment Pain Assessment: 0-10 Pain Score:   9 Pain Type: Surgical pain Pain Location: Back Pain Orientation: Posterior Pain Descriptors: Aching Pain Onset: On-going (more with activity) Pain Intervention(s): Medication (See eMAR);Repositioned Mobility Bed Mobility Bed Mobility: Yes Rolling Left: 5: Supervision;With rail Left Sidelying to Sit: 5: Supervision Locomotion  Ambulation Ambulation/Gait Assistance: 4: Min assist Ambulation Distance (Feet): 200 Feet (x 3) Assistive device: None High Level Ambulation High Level Ambulation: Other high level ambulation;Sudden stops;Head turns High Level Ambulation - Other Comments: gait around obstacles, stepping over objects all min assist Stairs / Additional Locomotion Stairs: Yes Stairs Assistance: 5: Supervision Stairs Assistance Details: Tactile cues for posture Stair Management Technique: Two rails Number of Stairs: 20  Wheelchair Mobility Wheelchair Mobility: No  Trunk/Postural Assessment     Balance     Exercises    Other Treatments    Therapy/Group: Individual Therapy  Julian Reil 01/18/2011, 12:05 PM

## 2011-01-18 NOTE — Progress Notes (Signed)
Subjective/Complaints: Review of Systems  Musculoskeletal: Positive for back pain.  All other systems reviewed and are negative.  a little difficulty with sleep last noc   Objective: Vital Signs: Blood pressure 99/65, pulse 57, temperature 96 F (35.6 C), temperature source Oral, resp. rate 20, height 5\' 8"  (1.727 m), weight 90.402 kg (199 lb 4.8 oz), SpO2 93.00%. No results found.  Basename 01/16/11 0635  WBC 12.2*  HGB 9.1*  HCT 27.8*  PLT 381    Basename 01/16/11 0635  NA 137  K 3.7  CL 105  CO2 22  GLUCOSE 107*  BUN 11  CREATININE 0.80  CALCIUM 8.6   CBG (last 3)  No results found for this basename: GLUCAP:3 in the last 72 hours  Wt Readings from Last 3 Encounters:  01/17/11 90.402 kg (199 lb 4.8 oz)  01/07/11 98.9 kg (218 lb 0.6 oz)  01/07/11 98.9 kg (218 lb 0.6 oz)    Physical Exam:  General appearance: alert Head: Normocephalic, without obvious abnormality, atraumatic Eyes: conjunctivae/corneas clear. PERRL, EOM's intact. Fundi benign. Ears: normal TM's and external ear canals both ears Nose: Nares normal. Septum midline. Mucosa normal. No drainage or sinus tenderness. Throat: lips, mucosa, and tongue normal; teeth and gums normal Neck: no adenopathy, no carotid bruit, no JVD, supple, symmetrical, trachea midline and thyroid not enlarged, symmetric, no tenderness/mass/nodules Back: symmetric, no curvature. ROM normal. No CVA tenderness. Resp: clear to auscultation bilaterally Cardio: regular rate and rhythm, S1, S2 normal, no murmur, click, rub or gallop GI: soft, non-tender; bowel sounds normal; no masses,  no organomegaly Extremities: extremities normal, atraumatic, no cyanosis or edema Pulses: 2+ and symmetric Skin: Skin color, texture, turgor normal. No rashes or lesions Neurologic: Grossly normal pain inhibition weakness in proximal limbs.  No focal motor or sensory loss. Cognition improving. Incision/Wound: wound clean and intact.  Seems to  be tolerating movement a bit better.   Assessment/Plan: 1. Functional deficits secondary to pathologic L3 fx, s/p corpectomy/vertebrectomy which require 3+ hours per day of interdisciplinary therapy in a comprehensive inpatient rehab setting. Physiatrist is providing close team supervision and 24 hour management of active medical problems listed below. Physiatrist and rehab team continue to assess barriers to discharge/monitor patient progress toward functional and medical goals  Complete IPOC  Mobility: Bed Mobility Bed Mobility: Yes Rolling Right: 5: Supervision Rolling Right Details (indicate cue type and reason): pt demonstrated logroll without cues Right Sidelying to Sit: 5: Supervision Left Sidelying to Sit: 5: Supervision Sitting - Scoot to Edge of Bed: 5: Supervision Sit to Supine - Left: 5: Supervision Transfers Transfers: Yes Sit to Stand: 5: Supervision Stand to Sit: 5: Supervision Stand Pivot Transfers: 4: Min assist Ambulation/Gait Ambulation/Gait Assistance: 2: Max assist Ambulation/Gait Assistance Details (indicate cue type and reason): performed short distance without RW.  Pt. tended to furinture walk with an increase in pain Ambulation Distance (Feet): 150 Feet (x 2) Assistive device: Rolling walker Gait Pattern: Step-through pattern;Trunk flexed Gait velocity: WFL Stairs: Yes Stairs Assistance: 4: Min assist Stair Management Technique: Two rails;Step to pattern Number of Stairs: 5  Height of Stairs: 6  Wheelchair Mobility Wheelchair Mobility: No Wheelchair Assistance: 4: Systems analyst: Both upper extremities Distance: 60 ADL:   Cognition: Cognition Overall Cognitive Status: Appears within functional limits for tasks assessed Arousal/Alertness: Awake/alert Orientation Level: Oriented X4 Attention: Divided (stopped walking when answering a question) Focused Attention: Appears intact Sustained Attention: Appears intact Selective  Attention: Appears intact Alternating Attention: Appears intact Memory:  Impaired (repeated stories of military service 2x in 15 min) Awareness: Appears intact Problem Solving: Impaired Problem Solving Impairment: Functional basic (new learning) Behaviors: Lability (tearful during eval) Safety/Judgment: Appears intact Cognition Arousal/Alertness: Awake/alert Orientation Level: Oriented X4  1.  DVT Prophylaxis/Anticoagulation: Mechanical:  Antiembolism stockings, knee (TED hose) Bilateral lower extremities and lovenox to coumadin. INR is approaching 2.0  2. Pain Management: Tylenol, robaxin and oxycodone. Patient prefers to use minimal pain medication over fears of addiction. I encouraged the patient to utilize pain medication as needed given his history and pain symptoms.  Patient seems to have done fairly well with this so far.  He uses good technique with movements which minimizes pain.  3. Mood: Team will provide ego support.  Patient having some issues coping with this hospitalization and medical situation.  4. Constipation: Scheduled Senokot-S with as needed MiraLAX and Dulcolax suppository.   5. multiple myeloma: Workup per Dr. Arline Asp. Will discuss treatment plan although I suspect treatment will begin after discharge from rehabilitation.   6. history of orthostasis: Push by mouth fluids. TED stockings will be used as well.   7. anemia: This is due to his postoperative blood loss as well as his multiple myeloma. Iron supplementation daily. Check serial CBCs. We'll transfuse patient as appropriate. Encouraged dietary intake of iron. Hgb holding stable at present  8. Leukocytosis:  Afebrile. Wound clean.  Urine cx negative.  Encourage IS.         Graviela Nodal T 01/18/2011, 7:11 AM

## 2011-01-18 NOTE — Progress Notes (Signed)
Occupational Therapy Session Note  Patient Details  Name: Rodney [name] MRN: [id number] Date of Birth: 1940-08-26  Today's Date: 01/18/2011 Time: 5784-6962 Time Calculation (min): 45 min  Precautions: Precautions Precautions: Back Required Braces or Orthoses: Yes Spinal Brace: Lumbar corset Restrictions Weight Bearing Restrictions: No  Short Term Goals: OT Short Term Goal 1: Pt will don LB clothing with AE prn sit to stand with supervision. OT Short Term Goal 2: Pt will perform toileting with distand supervision. OT Short Term Goal 3: Pt will transfer into tub/shower by stepping over ledge of tub with min A. OT Short Term Goal 4: Pt will tolerate standing for all grooming tasks at sink with supervision.  Skilled Therapeutic Interventions/Progress Updates:   [place] with Recreational Therapist to involve pt in community integration activities. Pt supervision supine to sit, and minimal assistance from sitting EOB to w/c for transfers.  Therapist engaged pt in functional ambulation in community setting with focus on safety awareness and problem solving.  Pt used  Reacher appropriately to gather item from lower surface.  Pt c/o fatigue and required rest break.  Discussed with pt safety awareness regarding activity tolerance and necessity for rest breaks if needed.    Pain  Pain Assessment: 0-10 (6) Pain Score:   6 Pain Type: Acute pain Pain Location: Back Pain Orientation: Posterior Pain Onset: On-going (c/o more with activity) Nursing notified        Therapy/Group: Individual Therapy  Janett Billow 01/18/2011, 2:22 PM

## 2011-01-18 NOTE — Progress Notes (Signed)
Occupational Therapy Session Note  Patient Details  Name: COLONEL KRAUSER MRN: [id number] Date of Birth: 07-08-1940  Today's Date: 01/18/2011 Time: 0930-1030 Time Calculation (min): 60 min  Precautions: Precautions Precautions: Back Required Braces or Orthoses: Yes Spinal Brace: Lumbar corset Restrictions Weight Bearing Restrictions: No  Short Term Goals: OT Short Term Goal 1: Pt will don LB clothing with AE prn sit to stand with supervision. OT Short Term Goal 2: Pt will perform toileting with distand supervision. OT Short Term Goal 3: Pt will transfer into tub/shower by stepping over ledge of tub with min A. OT Short Term Goal 4: Pt will tolerate standing for all grooming tasks at sink with supervision.  Skilled Therapeutic Interventions/Progress Updates:    ADL retraining including bathing at shower level, and dressing seated on tub bench.  Pt supervision from supine to sit, and minimal assistance from sitting EOB to w/c for transfers. Pt required moderate cueing and minimal assistance while standing in shower for bathing.   Pt aware of safety, still needs minimal verbal cues due to back precautions with back brace while standing.  Pt supervision with upper/lower body dressing with use of adaptive equipment.  Focus on safety with transferring for ADLS to increase independence.          Pain Pain Assessment: 3-10 Pain Score:   3 Pain Type: acute pain Pain Location: Back Pain Orientation: Posterior Pain Onset: On-going (more with activity)     Therapy/Group: Individual Therapy  Janett Billow 01/18/2011, 12:53 PM

## 2011-01-18 NOTE — Progress Notes (Signed)
ANTICOAGULATION CONSULT NOTE - Follow Up Consult  Pharmacy Consult:  Coumadin Indication: h/o PE  No Known Allergies  Patient Measurements: Height: 5\' 8"  (172.7 cm) Weight: 199 lb 4.8 oz (90.402 kg) IBW/kg (Calculated) : 68.4    Vital Signs: Temp: 96 F (35.6 C) (11/15 0412) Temp src: Oral (11/15 0412) BP: 99/65 mmHg (11/15 0412) Pulse Rate: 57  (11/15 0412)  Labs:  Basename 01/18/11 0642 01/17/11 0640 01/16/11 0635  HGB 9.4* -- 9.1*  HCT 30.3* -- 27.8*  PLT 434* -- 381  APTT -- -- --  LABPROT 25.4* 23.4* 20.5*  INR 2.27* 2.04* 1.72*  HEPARINUNFRC -- -- --  CREATININE -- -- 0.80  CKTOTAL -- -- --  CKMB -- -- --  TROPONINI -- -- --   Estimated Creatinine Clearance: 96.5 ml/min (by C-G formula based on Cr of 0.8).  Assessment:  39 YOM on Coumadin for h/o PE in May 2012. INR therapeutic. Noted pt on diflucan.   Goal of Therapy:  INR 2-3   Plan:  1. Coumadin 5mg  PO today.   2. Daily PT/INR for now.    Laney Potash, Margene Cherian Dien 01/18/2011,10:52 AM

## 2011-01-18 NOTE — Progress Notes (Signed)
Patient Details  Name: Rodney [name] MRN: [id number] Date of Birth: 1940-12-02  Today's Date: 01/18/2011 Time: 13:05-13:30  Skilled Therapeutic Interventions/Progress Updates: Ambulated in gift shop without RW with contact guard assist.  Pt able to problem solve safe ways to obtain items from various heights of shelves using AE (reacher).  Pt needed verbal instruction and questioning cues to identify fatigue and take a seated rest break.  Pt's wife present and observing.  Pt required min cues to identify energy conservation techniques.  Pt c/o  3/10 back pain.  RN aware.  Therapy/Group: Other: co-treat with OT  Activity Level: Moderate:  Level of assist: Min Assist  Newel Oien 01/18/2011, 4:17 PM

## 2011-01-18 NOTE — Progress Notes (Signed)
Physical Therapy Session Note  Patient Details  Name: Rodney [name] MRN: [id number] Date of Birth: February 13, 1941  Today's Date: 01/18/2011 Time: 0865-7846 Time Calculation (min): 32 min  Precautions: Precautions Precautions: Back Required Braces or Orthoses: Yes Spinal Brace: Lumbar corset Restrictions Weight Bearing Restrictions: No   Pain Pain Assessment Pain Assessment: 0-10 Pain Score:   5 Pain Type: Surgical pain Pain Location: Back Pain Orientation: Lower Pain Descriptors: Discomfort Pain Onset: On-going Patients Stated Pain Goal: 0 Pain Intervention(s): Other (Comment) (premedicated) Multiple Pain Sites: No Locomotion  Ambulation Ambulation: Yes Ambulation/Gait Assistance: 5: Supervision Ambulation Distance (Feet): 100 Feet (x 2) Assistive device: Rolling walker Ambulation/Gait Assistance Details: Verbal cues for precautions/safety Ambulation/Gait Assistance Details (indicate cue type and reason): cues for safety to prevent twisting and maintain safe gait velocity Stairs / Additional Locomotion Curb: 4: Min assist (with RW and verbal cues for sequence, safety)   Exercises Cardiovascular Exercises NuStep: 10 minutes with LE only at level 5 resistance and RPE level 13  Therapy/Group: Individual Therapy  Edman Circle [facility] 01/18/2011, 4:21 PM

## 2011-01-18 NOTE — Progress Notes (Signed)
Pt c/o pain at times through out morning. Gave Roxicodone and patient sat it down on bed tray and forgot to take. Wife found medication and consulted with me and determined it to be pain medication he asked for around noon. Time adjusted to reflect actual time taken. Noted patient to be a little forgetful at times. Brought this to his attention and his wife. Wife stated she also has noticed this. Pt has no c/o pain during afternoon hours following therapies.  Pt participating with therapies. Continue plan of care.

## 2011-01-19 LAB — PROTIME-INR: INR: 2.96 — ABNORMAL HIGH (ref 0.00–1.49)

## 2011-01-19 MED ORDER — ACETAMINOPHEN 325 MG PO TABS
650.0000 mg | ORAL_TABLET | Freq: Four times a day (QID) | ORAL | Status: DC
  Administered 2011-01-19 – 2011-01-22 (×13): 650 mg via ORAL
  Filled 2011-01-19 (×14): qty 2

## 2011-01-19 NOTE — Progress Notes (Signed)
Physical Therapy Session Note  Patient Details  Name: Rodney [name] MRN: [id number] Date of Birth: January 12, 1941  Today's Date: 01/19/2011 Time: 6962-9528 Time Calculation (min): 55 min  Precautions: Precautions Precautions: Back;Fall Required Braces or Orthoses: Yes Spinal Brace: Lumbar corset Restrictions Weight Bearing Restrictions: No Pain Pain Assessment Pain Assessment: No/denies pain Pain Score: 0-No pain Pain Type: Acute pain Pain Location: Back Pain Onset: On-going Mobility Transfers Sit to Stand: 5: Supervision;With armrests Sit to Stand Details: Verbal cues for precautions/safety Stand to Sit: 5: Supervision Stand to Sit Details (indicate cue type and reason): Verbal cues for precautions/safety Locomotion  Ambulation Ambulation/Gait Assistance: 5: Supervision Assistive device: Rolling walker;Other (Comment) (LSO) Ambulation/Gait Assistance Details: Visual cues/gestures for sequencing;Verbal cues for precautions/safety Stairs / Additional Locomotion Stairs Assistance: 5: Supervision Stairs Assistance Details: Verbal cues for precautions/safety;Verbal cues for technique Stairs Assistance Details (indicate cue type and reason): initiated stair training sideways with both hands on one rail due to pt reporting feeling like he is falling forward during descent. Pt verbalized feeling "more secure" with sideways technique. Stair Management Technique: One rail Right Number of Stairs: 15  Height of Stairs:  (4-6 inch steps)     Other Treatments Treatments Therapeutic Activity: Dynamic gait training with RW around obstacles, focus on problem solving, sidestepping through tight spaces, and RW position with tight turns to increase functional independence and decrease risk of fall. Pt requires intermittent min assist with obstacle negotiation for steadying during turns.  Therapy/Group: Individual Therapy  Romeo Rabon 01/19/2011, 5:13 PM

## 2011-01-19 NOTE — Progress Notes (Signed)
Occupational Therapy Session Note  Patient Details  Name: Rodney [name] MRN: [id number] Date of Birth: August 11, 1940  Today's Date: 01/19/2011 Time: 0930-1030 Time Calculation (min): 60 min  Precautions: Precautions Precautions: Back Required Braces or Orthoses: Yes Spinal Brace: Lumbar corset Restrictions Weight Bearing Restrictions: No  Short Term Goals: OT Short Term Goal 1: Pt will don LB clothing with AE prn sit to stand with supervision. OT Short Term Goal 2: Pt will perform toileting with distand supervision. OT Short Term Goal 3: Pt will transfer into tub/shower by stepping over ledge of tub with min A. OT Short Term Goal 4: Pt will tolerate standing for all grooming tasks at sink with supervision.  Skilled Therapeutic Interventions/Progress Updates:    Upon arrival pt resting in bed. Pt declined bathing and dressing this morning.  Pt supervision from supine to sit, and minimal assist with sit to stand to ambulate with r/w.   Pt able to problem solve safely with use of r/w for functional tasks around the bedroom including: bed making, using proper body mechanics correctly with back precautions, and log rolling in/out bed to increase independence.  Focus on safety awareness, activity tolerance, and standing balance during functional activities.     Pain Pain Assessment Pain Assessment: 0-10 (5) Pain Score:   5 Pain Type: Acute pain Pain Location: Back Pain Onset: On-going (c/o more pain with activity)       Therapy/Group: Individual Therapy  Janett Billow 01/19/2011, 12:28 PM

## 2011-01-19 NOTE — Progress Notes (Signed)
Occupational Therapy Session Note  Patient Details  Name: Rodney [name] MRN: [id number] Date of Birth: 1940-07-27  Today's Date: 01/19/2011 Time: 1300-1330 Time Calculation (min): 30 min  Precautions: Precautions Precautions: Back Required Braces or Orthoses: Yes Spinal Brace: Lumbar corset Restrictions Weight Bearing Restrictions: No  Short Term Goals: OT Short Term Goal 1: Pt will don LB clothing with AE prn sit to stand with supervision. OT Short Term Goal 2: Pt will perform toileting with distand supervision. OT Short Term Goal 3: Pt will transfer into tub/shower by stepping over ledge of tub with min A. OT Short Term Goal 4: Pt will tolerate standing for all grooming tasks at sink with supervision.  Skilled Therapeutic Interventions/Progress Updates:    Upon arrival pt resting in bed.  Pt minimal assistance with minimal verbal cues for log rolling from supine to sit, leaving right leg behind.   Pt minimal assist with ambulation using r/w to perform kitchen task.  Pt very distracted and needed moderate verbal/tactile cues for proper body positioning and safety with reaching for items with r/w.  Focus on body positioning with back precautions, and safety with bed mobility for transfers to increase independence.       Pain Pain Assessment Pain Assessment: 0-10 (3) Pain Score:   3 Pain Type: Acute pain Pain Location: Back Pain Orientation: Posterior Pain Onset: On-going (c/o more with activity)     Therapy/Group: Individual Therapy  Janett Billow 01/19/2011, 2:42 PM

## 2011-01-19 NOTE — Progress Notes (Signed)
Ambulate walker supervision. Pt better controlled today. Scheduled tylenol began today. Good appetite. No problems with bowel/bladder. Participating with therapy. Continue plan of care.

## 2011-01-19 NOTE — Progress Notes (Signed)
Physical Therapy Session Note  Patient Details  Name: Rodney [name] MRN: [id number] Date of Birth: 1940-07-29  Today's Date: 01/19/2011 Time: 0930-1030 Time Calculation (min): 60 min  Precautions: Precautions Precautions: Back Required Braces or Orthoses: Yes Spinal Brace: Lumbar corset Restrictions Weight Bearing Restrictions: No  Short Term Goals:    Skilled Therapeutic Interventions/Progress Updates:      General   Vital Signs   Pain Pain Assessment Pain Assessment: 0-10 Pain Score:   6 Pain Type: Surgical pain Pain Location: Back Pain Orientation: Posterior Pain Onset: On-going (c/o more pain with activity) Mobility   Locomotion  Ambulation Ambulation/Gait Assistance: 5: Supervision Ambulation Distance (Feet): 150 Feet Assistive device: Rolling walker Ambulation/Gait Assistance Details: Tactile cues for posture Ambulation/Gait Assistance Details (indicate cue type and reason): gait training with out RW min assist   Trunk/Postural Assessment     Balance dynamic gait without AD around obstacles, SLS, and stepping over obstacles     Exercises  Pt. Given HEP for squats, hip abduction, flexion , and heel and toe raises 20 each    Other Treatments    Therapy/Group: Individual Therapy  Julian Reil 01/19/2011, 12:47 PM

## 2011-01-19 NOTE — Plan of Care (Signed)
Problem: RH PAIN MANAGEMENT Goal: RH STG PAIN MANAGED AT OR BELOW PT'S PAIN GOAL Outcome: Progressing Pt pain level kept at tolerable level for patient comfort

## 2011-01-19 NOTE — Progress Notes (Signed)
Per State Regulation 482.30 This chart was reviewed for medical necessity with respect to the patient's Admission/Duration of stay. Pt participating and progressing in therapies. Team reports pt to meet goals and be ready for d/c by 01/22/11. Marissa Nestle, PA in agreement.   Meryl Dare                 Nurse Care Manager              Next Review Date: 01/22/11

## 2011-01-19 NOTE — Progress Notes (Signed)
ANTICOAGULATION CONSULT NOTE - Follow Up Consult  Pharmacy Consult for Coumadin Indication: History of PE (5/12)  No Known Allergies  Patient Measurements: Height: 5\' 8"  (172.7 cm) Weight: 199 lb 4.8 oz (90.402 kg) IBW/kg (Calculated) : 68.4    Vital Signs: Temp: 97.9 F (36.6 C) (11/16 0500) Temp src: Oral (11/16 0500) BP: 100/72 mmHg (11/16 0620) Pulse Rate: 60  (11/16 0620)  Labs:  Basename 01/19/11 0640 01/18/11 0642 01/17/11 0640  HGB -- 9.4* --  HCT -- 30.3* --  PLT -- 434* --  APTT -- -- --  LABPROT 31.3* 25.4* 23.4*  INR 2.96* 2.27* 2.04*  HEPARINUNFRC -- -- --  CREATININE -- -- --  CKTOTAL -- -- --  CKMB -- -- --  TROPONINI -- -- --   Estimated Creatinine Clearance: 96.5 ml/min (by C-G formula based on Cr of 0.8).   Medications:  Scheduled:    . acetaminophen  650 mg Oral QID  . chlorhexidine  15 mL Mouth/Throat BID  . ferrous fumarate-b12-vitamic C-folic acid  1 capsule Oral TID PC  . fluconazole  100 mg Oral Daily  . pantoprazole  40 mg Oral Q1200  . polyethylene glycol  17 g Oral Daily  . polysaccharide iron  150 mg Oral BID  . warfarin  5 mg Oral ONCE-1800   Goal of Therapy:  INR 2-3   Assessment & Plan:  70yo male with h/o PE in 5/12.  Coumadin approaching upper limit of INR goal, possibly reflective of increase effect d/t Fluconazole interaction.  Fluconazole course ended yesterday.  No problems noted.  Will back off dose today, but may need to increase dose as Fluc effect wears off.  1.  Coumadin 2mg  x 1 2.  F/U in AM  Lexander Tremblay P 01/19/2011,2:52 PM

## 2011-01-19 NOTE — Progress Notes (Signed)
Subjective/Complaints: Review of Systems  Musculoskeletal: Positive for back pain.  All other systems reviewed and are negative.  Pain under reasonable contorl.   Objective: Vital Signs: Blood pressure 100/72, pulse 60, temperature 97.9 F (36.6 C), temperature source Oral, resp. rate 18, height 5\' 8"  (1.727 m), weight 90.402 kg (199 lb 4.8 oz), SpO2 96.00%. No results found.  Basename 01/18/11 0642  WBC 11.9*  HGB 9.4*  HCT 30.3*  PLT 434*   No results found for this basename: NA:2,K:2,CL:2,CO2:2,GLUCOSE:2,BUN:2,CREATININE:2,CALCIUM:2 in the last 72 hours CBG (last 3)  No results found for this basename: GLUCAP:3 in the last 72 hours  Wt Readings from Last 3 Encounters:  01/17/11 90.402 kg (199 lb 4.8 oz)  01/07/11 98.9 kg (218 lb 0.6 oz)  01/07/11 98.9 kg (218 lb 0.6 oz)    Physical Exam:  General appearance: alert Head: Normocephalic, without obvious abnormality, atraumatic Eyes: conjunctivae/corneas clear. PERRL, EOM's intact. Fundi benign. Ears: normal TM's and external ear canals both ears Nose: Nares normal. Septum midline. Mucosa normal. No drainage or sinus tenderness. Throat: lips, mucosa, and tongue normal; teeth and gums normal Neck: no adenopathy, no carotid bruit, no JVD, supple, symmetrical, trachea midline and thyroid not enlarged, symmetric, no tenderness/mass/nodules Back: symmetric, no curvature. ROM normal. No CVA tenderness. Resp: clear to auscultation bilaterally Cardio: regular rate and rhythm, S1, S2 normal, no murmur, click, rub or gallop GI: soft, non-tender; bowel sounds normal; no masses,  no organomegaly Extremities: extremities normal, atraumatic, no cyanosis or edema Pulses: 2+ and symmetric Skin: Skin color, texture, turgor normal. No rashes or lesions Neurologic: Grossly normal pain inhibition weakness in proximal limbs.  No focal motor or sensory loss. Cognition improving. Incision/Wound: wound clean and intact.  Seems to be  tolerating movement a bit better.   Assessment/Plan: 1. Functional deficits secondary to pathologic L3 fx, s/p corpectomy/vertebrectomy which require 3+ hours per day of interdisciplinary therapy in a comprehensive inpatient rehab setting. Physiatrist is providing close team supervision and 24 hour management of active medical problems listed below. Physiatrist and rehab team continue to assess barriers to discharge/monitor patient progress toward functional and medical goals  Complete IPOC  Mobility: Bed Mobility Bed Mobility: Yes Rolling Right: 5: Supervision Rolling Right Details (indicate cue type and reason): pt demonstrated logroll without cues Rolling Left: 5: Supervision;With rail Right Sidelying to Sit: 5: Supervision Left Sidelying to Sit: 5: Supervision Sitting - Scoot to Edge of Bed: 5: Supervision Sit to Supine - Left: 5: Supervision Transfers Transfers: Yes Sit to Stand: 5: Supervision Stand to Sit: 5: Supervision Stand Pivot Transfers: 4: Min assist Ambulation/Gait Ambulation/Gait Assistance: 5: Supervision Ambulation/Gait Assistance Details (indicate cue type and reason): cues for safety to prevent twisting and maintain safe gait velocity Ambulation Distance (Feet): 100 Feet (x 2) Assistive device: Rolling walker Gait Pattern: Step-through pattern;Trunk flexed Gait velocity: WFL Stairs: Yes Stairs Assistance: 5: Supervision Stair Management Technique: Two rails Number of Stairs: 20  Height of Stairs: 6  Wheelchair Mobility Wheelchair Mobility: No Wheelchair Assistance: 4: Systems analyst: Both upper extremities Distance: 60 ADL:   Cognition: Cognition Overall Cognitive Status: Appears within functional limits for tasks assessed Arousal/Alertness: Awake/alert Orientation Level: Oriented X4 Attention: Divided (stopped walking when answering a question) Focused Attention: Appears intact Sustained Attention: Appears intact Selective  Attention: Appears intact Alternating Attention: Appears intact Memory: Impaired (repeated stories of military service 2x in 15 min) Awareness: Appears intact Problem Solving: Impaired Problem Solving Impairment: Functional basic (new learning) Behaviors:  Lability (tearful during eval) Safety/Judgment: Appears intact Cognition Arousal/Alertness: Awake/alert Orientation Level: Oriented X4  1.  DVT Prophylaxis/Anticoagulation: Mechanical:  Antiembolism stockings, knee (TED hose) Bilateral lower extremities and lovenox to coumadin. INR is approaching 2.0  2. Pain Management: Tylenol, robaxin and oxycodone. Patient prefers to use minimal pain medication over fears of addiction. I encouraged the patient to utilize pain medication as needed given his history and pain symptoms.  Patient seems to have done fairly well with this so far.  He uses good technique with movements which minimizes pain.  3. Mood: Team will provide ego support.  Patient having some issues coping with this hospitalization and medical situation.  4. Constipation: Scheduled Senokot-S with as needed MiraLAX and Dulcolax suppository.   5. multiple myeloma: Workup per Dr. Arline Asp. Will discuss treatment plan although I suspect treatment will begin after discharge from rehabilitation.   6. history of orthostasis: Push by mouth fluids. TED stockings will be used as well.   7. anemia: This is due to his postoperative blood loss as well as his multiple myeloma. Iron supplementation daily. Check serial CBCs. We'll transfuse patient as appropriate. Encouraged dietary intake of iron. Hgb holding stable at present  8. Leukocytosis:  Afebrile. Wound clean.  Urine cx negative.  Encourage IS.         [name],Rodney T 01/19/2011, 7:58 AM

## 2011-01-20 LAB — PROTIME-INR
INR: 3.03 — ABNORMAL HIGH (ref 0.00–1.49)
Prothrombin Time: 31.9 seconds — ABNORMAL HIGH (ref 11.6–15.2)

## 2011-01-20 MED ORDER — WARFARIN SODIUM 2 MG PO TABS
2.0000 mg | ORAL_TABLET | Freq: Once | ORAL | Status: AC
  Administered 2011-01-20: 2 mg via ORAL
  Filled 2011-01-20: qty 1

## 2011-01-20 NOTE — Progress Notes (Signed)
ANTICOAGULATION CONSULT NOTE - Follow Up Consult  Pharmacy Consult:  Coumadin Indication: h/o PE  No Known Allergies  Patient Measurements: Height: 5\' 8"  (172.7 cm) Weight: 199 lb 4.8 oz (90.402 kg) IBW/kg (Calculated) : 68.4    Vital Signs: Temp: 97.4 F (36.3 C) (11/17 0457) Temp src: Oral (11/17 0457) BP: 89/60 mmHg (11/17 0501) Pulse Rate: 68  (11/17 0501)  Labs:  Basename 01/20/11 0750 01/19/11 0640 01/18/11 0642  HGB -- -- 9.4*  HCT -- -- 30.3*  PLT -- -- 434*  APTT -- -- --  LABPROT 31.9* 31.3* 25.4*  INR 3.03* 2.96* 2.27*  HEPARINUNFRC -- -- --  CREATININE -- -- --  CKTOTAL -- -- --  CKMB -- -- --  TROPONINI -- -- --   Estimated Creatinine Clearance: 96.5 ml/min (by C-G formula based on Cr of 0.8).   Assessment: 70 YOM on Coumadin for h/o PE. INR high end of goal.  Patient now off Diflucan.  Goal of Therapy:  INR 2-3   Plan:  1.  Repeat Coumadin 2mg  PO today. 2.  INR in AM.  Laney Potash, Alissah Redmon Dien 01/20/2011,9:24 AM

## 2011-01-20 NOTE — Progress Notes (Signed)
Physical Therapy Note  Patient Details  Name: Rodney [name] MRN: [id number] Date of Birth: March 20, 1940 Today's Date: 01/20/2011  Time: 815-915 60 minutes  No c/o pain. Gait training household environment for obstacle negotiation with Supervision, cues for safety. Furniture transfers with supervision, pt educated on safety for home, ideas for comfortable seating at home.  Gait controlled environment >1000' for muscle strength and endurance training.  Ramp and curb negotiation with RW min A cues for safety and technique.  Stair training with B and single handrail both up/down 10 stairs with supervision.  Nustep for muscle strength and endurance x 8 mins level 6.  Some increased pain with sit to stands, eased with rest.  Individual therapy   Darrek Leasure 01/20/2011, 12:21 PM

## 2011-01-20 NOTE — Progress Notes (Signed)
Physical Therapy Note  Patient Details  Name: Rodney [name] MRN: [id number] Date of Birth: 24-Jul-1940 Today's Date: 01/20/2011  Time: 1300-1355 55 minutes  Pt c/o 7/10 back pain. RN aware.  Rests as needed.  Gait increased distance >500' at pt request for increased endurance.  Review HEP with pt/wife for standing therex.  Pt reports that "marching" exercise hurts his back but refuses PT recommendation to modify exercise to cause less pain stating "this is how it is shown on the sheet so that is how I will do it".  Reviewed with pt/wife importance of only performing exercises to pain tolerance and within safe back precautions, pt resistant, wife expresses understanding.  Nustep for endurance training level 6 x 10 minutes.  Pt demo'd increased fatigue at end of session requiring several standing rests while walking back to room with RW.  Individual therapy   Rodney [name] 01/20/2011, 4:49 PM

## 2011-01-20 NOTE — Progress Notes (Signed)
Occupational Therapy Cancellation Note  Patient Details  Name: Rodney [name] MRN: [id number] Date of Birth: 1940-06-06 Today's Date: 01/20/2011  Patient declined 60 minute session stating that he had a lot of therapy this PM and was just too tired.  Attempted to review back precautions with patient and he was able to recall 1/3.  He irritable stated "I'm so tired I just can't think straight."  This clinician wrote back precautions on white board the left patient in straight back chair with spouse present.   Laniah Grimm 01/20/2011, 2:26 PM

## 2011-01-20 NOTE — Progress Notes (Signed)
Pt alert/oriented x3, up with corset with one assstance to stand/pivot to chair, continent of bowel/bladder, BM today, incision to back intact without red/swelling, incision x2 to left flank intact without red/swell/draining noted, C/O pain see mar will continue with plan of care

## 2011-01-20 NOTE — Progress Notes (Addendum)
Physical Therapy Note  Patient Details  Name: Rodney [name] MRN: [id number] Date of Birth: Mar 06, 1940 Today's Date: 01/20/2011 Treatment Time: 11:37-12:07 30 minutes  Pain:  None  Pt just finishing up bathing with wife and reporting that he was fatigued.  Pt requested that he work on endurance walking by walking downstairs to see outside.  Pt performed supine-sit, sit-stand with supervision.  Gait in the hospital setting with RW and supervision, 1000+ft./15minutes without rest break at a decreased speed.  Wife present for treatment session.  Pt pleased with his ability to gait for increased period of time without rest break.  Discussed with patient possibility of addressing gait without RW, pt declined at this time secondary to fear of falling.   Individual treatment session  [name] 01/20/2011, 12:22 PM

## 2011-01-20 NOTE — Progress Notes (Signed)
Subjective/Complaints: Review of Systems  Musculoskeletal: Positive for back pain.  All other systems reviewed and are negative.  Pain under reasonable contorl.  No new complaintss   Objective: Vital Signs: Blood pressure 89/60, pulse 68, temperature 97.4 F (36.3 C), temperature source Oral, resp. rate 20, height 5\' 8"  (1.727 m), weight 199 lb 4.8 oz (90.402 kg), SpO2 94.00%. No results found.  BP Readings from Last 3 Encounters:  01/20/11 89/60  01/15/11 111/66  01/15/11 111/66     Basename 01/18/11 0642  WBC 11.9*  HGB 9.4*  HCT 30.3*  PLT 434*   No results found for this basename: NA:2,K:2,CL:2,CO2:2,GLUCOSE:2,BUN:2,CREATININE:2,CALCIUM:2 in the last 72 hours CBG (last 3)  No results found for this basename: GLUCAP:3 in the last 72 hours  Wt Readings from Last 3 Encounters:  01/17/11 199 lb 4.8 oz (90.402 kg)  01/07/11 218 lb 0.6 oz (98.9 kg)  01/07/11 218 lb 0.6 oz (98.9 kg)   Lab Results  Component Value Date   INR 3.03* 01/20/2011   INR 2.96* 01/19/2011   INR 2.27* 01/18/2011   Physical Exam:  General appearance: alert Head: Normocephalic, without obvious abnormality, atraumatic Eyes: conjunctivae/corneas clear. PERRL, EOM's intact. Fundi benign. Ears: normal TM's and external ear canals both ears Nose: Nares normal. Septum midline. Mucosa normal. No drainage or sinus tenderness. Throat: lips, mucosa, and tongue normal; teeth and gums normal Neck: no adenopathy, no carotid bruit, no JVD, supple, symmetrical, trachea midline and thyroid not enlarged, symmetric, no tenderness/mass/nodules Back: symmetric, no curvature. ROM normal. No CVA tenderness. Resp: clear to auscultation bilaterally Cardio: regular rate and rhythm, S1, S2 normal, no murmur, click, rub or gallop GI: soft, non-tender; bowel sounds normal; no masses,  no organomegaly Extremities: extremities normal, atraumatic, no cyanosis or edema  SCDs  in place  Pulses: 2+ and  symmetric Skin: Skin color, texture, turgor normal. No rashes or lesions Neurologic: Grossly normal pain inhibition weakness in proximal limbs.  No focal motor or sensory loss. Cognition improving. Incision/Wound: wound clean and intact.  Seems to be tolerating movement a bit better.   Assessment/Plan: 1. Functional deficits secondary to pathologic L3 fx, s/p corpectomy/vertebrectomy which require 3+ hours per day of interdisciplinary therapy in a comprehensive inpatient rehab setting. Physiatrist is providing close team supervision and 24 hour management of active medical problems listed below. Physiatrist and rehab team continue to assess barriers to discharge/monitor patient progress toward functional and medical goals  Complete IPOC  Mobility: Bed Mobility Bed Mobility: Yes Rolling Right: 5: Supervision Rolling Right Details (indicate cue type and reason): pt demonstrated logroll without cues Rolling Left: 5: Supervision;With rail Right Sidelying to Sit: 5: Supervision Left Sidelying to Sit: 5: Supervision Sitting - Scoot to Edge of Bed: 5: Supervision Sit to Supine - Left: 5: Supervision Transfers Transfers: Yes Sit to Stand: 5: Supervision;With armrests Stand to Sit: 5: Supervision Stand Pivot Transfers: 4: Min assist Ambulation/Gait Ambulation/Gait Assistance: 4: Min assist;5: Supervision Ambulation/Gait Assistance Details (indicate cue type and reason): gait training with out RW min assist  Ambulation Distance (Feet): 150 Feet Assistive device: Rolling walker;Other (Comment) (LSO) Gait Pattern: Step-through pattern;Trunk flexed Gait velocity: WFL Stairs: Yes Stairs Assistance: 5: Supervision Stairs Assistance Details (indicate cue type and reason): initiated stair training sideways with both hands on one rail due to pt reporting feeling like he is falling forward during descent. Pt verbalized feeling "more secure" with sideways technique. Stair Management Technique: One  rail Right Number of Stairs: 15  Height  of Stairs:  (4-6 inch steps) Corporate treasurer: No Wheelchair Assistance: 4: Systems analyst: Both upper extremities Distance: 60 ADL:   Cognition: Cognition Overall Cognitive Status: Appears within functional limits for tasks assessed Arousal/Alertness: Awake/alert Orientation Level: Oriented X4 Attention: Divided (stopped walking when answering a question) Focused Attention: Appears intact Sustained Attention: Appears intact Selective Attention: Appears intact Alternating Attention: Appears intact Memory: Impaired (repeated stories of military service 2x in 15 min) Awareness: Appears intact Problem Solving: Impaired Problem Solving Impairment: Functional basic (new learning) Behaviors: Lability (tearful during eval) Safety/Judgment: Appears intact Cognition Arousal/Alertness: Awake/alert Orientation Level: Oriented X4  1.  DVT Prophylaxis/Anticoagulation: Mechanical:  Antiembolism stockings, knee (TED hose) Bilateral lower extremities and lovenox to coumadin. INR  Therapeutic 2. Pain Management: Tylenol, robaxin and oxycodone. Patient prefers to use minimal pain medication over fears of addiction. I encouraged the patient to utilize pain medication as needed given his history and pain symptoms.  Patient seems to have done fairly well with this so far.  He uses good technique with movements which minimizes pain.  3. Mood: Team will provide ego support.  Patient having some issues coping with this hospitalization and medical situation.  4. Constipation: Scheduled Senokot-S with as needed MiraLAX and Dulcolax suppository.   5. multiple myeloma: Workup per Dr. Arline Asp. Will discuss treatment plan although I suspect treatment will begin after discharge from rehabilitation.   6. history of orthostasis: Push by mouth fluids. TED stockings will be used as well.   7. anemia: This is due to his  postoperative blood loss as well as his multiple myeloma. Iron supplementation daily. Check serial CBCs. We'll transfuse patient as appropriate. Encouraged dietary intake of iron. Hgb holding stable at present  8. Leukocytosis:  Afebrile. Wound clean.  Urine cx negative.  Encourage IS.         Rogelia Boga 01/20/2011, 9:03 AM

## 2011-01-21 LAB — PROTIME-INR
INR: 2.63 — ABNORMAL HIGH (ref 0.00–1.49)
Prothrombin Time: 28.5 seconds — ABNORMAL HIGH (ref 11.6–15.2)

## 2011-01-21 MED ORDER — WARFARIN SODIUM 7.5 MG PO TABS
7.5000 mg | ORAL_TABLET | Freq: Once | ORAL | Status: AC
  Administered 2011-01-21: 7.5 mg via ORAL
  Filled 2011-01-21: qty 1

## 2011-01-21 NOTE — Progress Notes (Signed)
Pt alert/oriented x3, up oob with corset on with one asst for hands on with walker, incision to lumbar intact without red/swelling/drainange,left flank with incision and lap site x1, both sites without red/swell/drainange noted,  Scheduled tylenol for pain with good relief, continent of bowel/bladder, last bowel movement 18th, will continue with plan of care

## 2011-01-21 NOTE — Progress Notes (Signed)
Bed alarm on for patient's safety

## 2011-01-21 NOTE — Progress Notes (Addendum)
Physical Therapy Session Note  Patient Details  Name: Rodney [name] MRN: [id number] Date of Birth: November 23, 1940  Today's Date: 01/21/2011 Time: 1104-1205 Time Calculation (min): 61 min  Precautions: Precautions Precautions: Back;Fall Required Braces or Orthoses: Yes Spinal Brace: Lumbar corset Restrictions Weight Bearing Restrictions: No   Skilled Therapeutic Interventions/Progress Updates:   Mr. Wyles was seen today in walking group for gait training with RW, ascending and descending steps and NuStep to increase tolerance to activity.      Pain Pain Assessment Pain Assessment: 0-10 Pain Score:  3- 4 Pain Type: Surgical pain Pain Location: Back Pain Descriptors: Aching Pain Frequency: Intermittent Pain Onset: With Activity Patients Stated Pain Goal: 2 Pain Intervention(s): Medication (See eMAR);Emotional support;Relaxation  Locomotion  Ambulation: Pt able to amb 15 minutes with RW with distant S on the unit.  He was able to ascend/descend 12 steps with 1 rail with S.   Ambulation/Gait Assistance: 5: Supervision   Balance  Pt able to stand and perform reaching tasks through min to mod excursions with S.      Exercises  Nustep at level 6 x 12 min to increase activity tolerance.   Therapy/Group: Group Therapy Anjanette Gilkey,TAMMY 01/21/2011, 12:40 PM

## 2011-01-21 NOTE — Progress Notes (Signed)
Subjective/Complaints: Review of Systems  Musculoskeletal: Positive for back pain.  All other systems reviewed and are negative.  Pain under reasonable contorl.  No new complaintss.  Comfortable- constipation now well controlled.  Held final session yesterday (11-17) due to fatique.   Objective: Vital Signs: Blood pressure 88/52, pulse 82, temperature 98.6 F (37 C), temperature source Oral, resp. rate 19, height 5\' 8"  (1.727 m), weight 199 lb 4.8 oz (90.402 kg), SpO2 99.00%. No results found.  BP Readings from Last 3 Encounters:  01/21/11 88/52  01/15/11 111/66  01/15/11 111/66    No results found for this basename: WBC:2,HGB:2,HCT:2,PLT:2 in the last 72 hours No results found for this basename: NA:2,K:2,CL:2,CO2:2,GLUCOSE:2,BUN:2,CREATININE:2,CALCIUM:2 in the last 72 hours CBG (last 3)  No results found for this basename: GLUCAP:3 in the last 72 hours  Wt Readings from Last 3 Encounters:  01/17/11 199 lb 4.8 oz (90.402 kg)  01/07/11 218 lb 0.6 oz (98.9 kg)  01/07/11 218 lb 0.6 oz (98.9 kg)   Lab Results  Component Value Date   INR 3.03* 01/20/2011   INR 2.96* 01/19/2011   INR 2.27* 01/18/2011   Physical Exam:  General appearance: alert Head: Normocephalic, without obvious abnormality, atraumatic Eyes: conjunctivae/corneas clear. PERRL, EOM's intact. Fundi benign. Ears: normal TM's and external ear canals both ears Nose: Nares normal. Septum midline. Mucosa normal. No drainage or sinus tenderness. Throat: lips, mucosa, and tongue normal; teeth and gums normal Neck: no adenopathy, no carotid bruit, no JVD, supple, symmetrical, trachea midline and thyroid not enlarged, symmetric, no tenderness/mass/nodules Back: symmetric, no curvature. ROM normal. No CVA tenderness. Resp: clear to auscultation bilaterally Cardio: regular rate and rhythm, S1, S2 normal, no murmur, click, rub or gallop GI: soft, non-tender; bowel sounds normal; no masses,  no  organomegaly Extremities: extremities normal, atraumatic, no cyanosis or edema  SCDs  in place  Pulses: 2+ and symmetric Skin: Skin color, texture, turgor normal. No rashes or lesions Neurologic: Grossly normal pain inhibition weakness in proximal limbs.  No focal motor or sensory loss. Cognition improving. Incision/Wound: wound clean and intact.  Seems to be tolerating movement a bit better.   Assessment/Plan: 1. Functional deficits secondary to pathologic L3 fx, s/p corpectomy/vertebrectomy which require 3+ hours per day of interdisciplinary therapy in a comprehensive inpatient rehab setting. Physiatrist is providing close team supervision and 24 hour management of active medical problems listed below. Physiatrist and rehab team continue to assess barriers to discharge/monitor patient progress toward functional and medical goals  Complete IPOC  Mobility: Bed Mobility Bed Mobility: Yes Rolling Right: 5: Supervision Rolling Right Details (indicate cue type and reason): pt demonstrated logroll without cues Rolling Left: 5: Supervision;With rail Right Sidelying to Sit: 5: Supervision Left Sidelying to Sit: 5: Supervision Sitting - Scoot to Edge of Bed: 5: Supervision Sit to Supine - Left: 5: Supervision Transfers Transfers: Yes Sit to Stand: 5: Supervision;With armrests Stand to Sit: 5: Supervision Stand Pivot Transfers: 4: Min assist Ambulation/Gait Ambulation/Gait Assistance: 5: Supervision Ambulation/Gait Assistance Details (indicate cue type and reason): gait training with out RW min assist  Ambulation Distance (Feet): 150 Feet Assistive device: Rolling walker;Other (Comment) (LSO) Gait Pattern: Step-through pattern;Trunk flexed Gait velocity: WFL Stairs: Yes Stairs Assistance: 5: Supervision Stairs Assistance Details (indicate cue type and reason): initiated stair training sideways with both hands on one rail due to pt reporting feeling like he is falling forward during  descent. Pt verbalized feeling "more secure" with sideways technique. Stair Management Technique: One  rail Right Number of Stairs: 15  Height of Stairs:  (4-6 inch steps) Wheelchair Mobility Wheelchair Mobility: No Wheelchair Assistance: 4: Systems analyst: Both upper extremities Distance: 60 ADL:   Cognition: Cognition Overall Cognitive Status: Appears within functional limits for tasks assessed Arousal/Alertness: Awake/alert Orientation Level: Oriented X4 Attention: Divided (stopped walking when answering a question) Focused Attention: Appears intact Sustained Attention: Appears intact Selective Attention: Appears intact Alternating Attention: Appears intact Memory: Impaired (repeated stories of military service 2x in 15 min) Awareness: Appears intact Problem Solving: Impaired Problem Solving Impairment: Functional basic (new learning) Behaviors: Lability (tearful during eval) Safety/Judgment: Appears intact Cognition Arousal/Alertness: Awake/alert Orientation Level: Oriented X4  1.  DVT Prophylaxis/Anticoagulation: Mechanical:  Antiembolism stockings, knee (TED hose) Bilateral lower extremities and lovenox to coumadin. INR  Therapeutic 2. Pain Management: Tylenol, robaxin and oxycodone. Patient prefers to use minimal pain medication over fears of addiction. I encouraged the patient to utilize pain medication as needed given his history and pain symptoms.  Patient seems to have done fairly well with this so far.  He uses good technique with movements which minimizes pain.  3. Mood: Team will provide ego support.  Patient having some issues coping with this hospitalization and medical situation.  4. Constipation: Scheduled Senokot-S with as needed MiraLAX and Dulcolax suppository.   5. multiple myeloma: Workup per Dr. Arline Asp. Will discuss treatment plan although I suspect treatment will begin after discharge from rehabilitation.   6. history of orthostasis:  Push by mouth fluids. TED stockings will be used as well.   7. anemia: This is due to his postoperative blood loss as well as his multiple myeloma. Iron supplementation daily. Check serial CBCs. We'll transfuse patient as appropriate. Encouraged dietary intake of iron. Hgb holding stable at present  8. Leukocytosis:  Afebrile. Wound clean.  Urine cx negative.  Encourage IS.         Rogelia Boga 01/21/2011, 8:49 AM

## 2011-01-21 NOTE — Progress Notes (Signed)
ANTICOAGULATION CONSULT NOTE - Follow Up Consult  Pharmacy Consult:  Coumadin Indication: h/o PE  No Known Allergies  Patient Measurements: Height: 5\' 8"  (172.7 cm) Weight: 199 lb 4.8 oz (90.402 kg) IBW/kg (Calculated) : 68.4    Vital Signs: Temp: 98.6 F (37 C) (11/18 0500) Temp src: Oral (11/18 0500) BP: 88/52 mmHg (11/18 0513) Pulse Rate: 82  (11/18 0513)  Labs:  Basename 01/21/11 0835 01/20/11 0750 01/19/11 0640  HGB -- -- --  HCT -- -- --  PLT -- -- --  APTT -- -- --  LABPROT 28.5* 31.9* 31.3*  INR 2.63* 3.03* 2.96*  HEPARINUNFRC -- -- --  CREATININE -- -- --  CKTOTAL -- -- --  CKMB -- -- --  TROPONINI -- -- --   Estimated Creatinine Clearance: 96.5 ml/min (by C-G formula based on Cr of 0.8).   Assessment: 70 YOM on Coumadin for h/o PE.  INR decreased to therapeutic level today. Anticipate pt needing higher doses as Diflucan course completed.  Goal of Therapy:  INR 2-3   Plan:  1.  Coumadin 7.5mg  PO today. 2.  INR in AM.  Laney Potash, Lesleigh Hughson Dien 01/21/2011,10:09 AM

## 2011-01-22 LAB — CREATININE, SERUM: Creatinine, Ser: 0.78 mg/dL (ref 0.50–1.35)

## 2011-01-22 LAB — PROTIME-INR: Prothrombin Time: 32.1 seconds — ABNORMAL HIGH (ref 11.6–15.2)

## 2011-01-22 MED ORDER — OXYCODONE HCL 5 MG PO TABS: 5.0000 mg | ORAL_TABLET | ORAL | Status: AC | PRN

## 2011-01-22 MED ORDER — METHOCARBAMOL 500 MG PO TABS: 500.0000 mg | ORAL_TABLET | Freq: Three times a day (TID) | ORAL | Status: AC | PRN

## 2011-01-22 MED ORDER — FE FUMARATE-B12-VIT C-FA-IFC PO CAPS: 1.0000 | ORAL_CAPSULE | Freq: Three times a day (TID) | ORAL | Status: DC

## 2011-01-22 MED ORDER — TRAMADOL HCL 50 MG PO TABS: 50.0000 mg | ORAL_TABLET | Freq: Four times a day (QID) | ORAL | Status: AC | PRN

## 2011-01-22 MED ORDER — WARFARIN SODIUM 2 MG PO TABS
2.0000 mg | ORAL_TABLET | Freq: Once | ORAL | Status: DC
  Filled 2011-01-22: qty 1

## 2011-01-22 MED ORDER — ACETAMINOPHEN 500 MG PO TABS: 500.0000 mg | ORAL_TABLET | Freq: Four times a day (QID) | ORAL | Status: DC | PRN

## 2011-01-22 MED ORDER — POLYETHYLENE GLYCOL 3350 17 G PO PACK: 17.0000 g | PACK | Freq: Every day | ORAL | Status: AC | PRN

## 2011-01-22 NOTE — Progress Notes (Signed)
Social Work  Team feels pt meeting his goals sooner than Wed, want to push up discharge date to today. Wife is here and has attended therapies with pt.   Discussed OP therapies with both want [facility] since closer to home.  No pref regarding DME- referral made to [facility] for  Rolling wajker and tub bench, to be delivered to pt's room prior to discharge.  Both pleased with his progress and agreeable to discharge today. Aware of support group available for multiple myeloma.

## 2011-01-22 NOTE — Progress Notes (Signed)
Pt discharged today at 1411. Discharge instructions given by Marissa Nestle, PA-C. Patient and wife verbalized understanding. Pt in good spirits. Rolling walker and tub bench to be delivered.

## 2011-01-22 NOTE — Progress Notes (Signed)
Per State Regulation 482.30 This chart was reviewed for medical necessity with respect to the patient's Admission/Duration of stay.  Pt's spouse attending therapies this AM in preparation for discharge this afternoon. Goals met. Discussed meds with her, she states she is aware and prepared for medication costs.   Meryl Dare                 Nurse Care Manager

## 2011-01-22 NOTE — Progress Notes (Signed)
Subjective/Complaints: Review of Systems  Musculoskeletal: Positive for back pain.  All other systems reviewed and are negative.  Pain under reasonable contorl.  No new complaintss.  Comfortable- constipation now well controlled.  Held final session yesterday (11-17) due to fatique.   Objective: Vital Signs: Blood pressure 93/54, pulse 70, temperature 97.7 F (36.5 C), temperature source Oral, resp. rate 19, height 5\' 8"  (1.727 m), weight 90.402 kg (199 lb 4.8 oz), SpO2 98.00%. No results found.  BP Readings from Last 3 Encounters:  01/22/11 93/54  01/15/11 111/66  01/15/11 111/66    No results found for this basename: WBC:2,HGB:2,HCT:2,PLT:2 in the last 72 hours No results found for this basename: NA:2,K:2,CL:2,CO2:2,GLUCOSE:2,BUN:2,CREATININE:2,CALCIUM:2 in the last 72 hours CBG (last 3)  No results found for this basename: GLUCAP:3 in the last 72 hours  Wt Readings from Last 3 Encounters:  01/17/11 90.402 kg (199 lb 4.8 oz)  01/07/11 98.9 kg (218 lb 0.6 oz)  01/07/11 98.9 kg (218 lb 0.6 oz)   Lab Results  Component Value Date   INR 2.63* 01/21/2011   INR 3.03* 01/20/2011   INR 2.96* 01/19/2011   Physical Exam:  General appearance: alert Head: Normocephalic, without obvious abnormality, atraumatic Eyes: conjunctivae/corneas clear. PERRL, EOM's intact. Fundi benign. Ears: normal TM's and external ear canals both ears Nose: Nares normal. Septum midline. Mucosa normal. No drainage or sinus tenderness. Throat: lips, mucosa, and tongue normal; teeth and gums normal Neck: no adenopathy, no carotid bruit, no JVD, supple, symmetrical, trachea midline and thyroid not enlarged, symmetric, no tenderness/mass/nodules Back: symmetric, no curvature. ROM normal. No CVA tenderness. Resp: clear to auscultation bilaterally Cardio: regular rate and rhythm, S1, S2 normal, no murmur, click, rub or gallop GI: soft, non-tender; bowel sounds normal; no masses,  no  organomegaly Extremities: extremities normal, atraumatic, no cyanosis or edema  SCDs  in place  Pulses: 2+ and symmetric Skin: Skin color, texture, turgor normal. No rashes or lesions Neurologic: Grossly normal pain inhibition weakness in proximal limbs.  No focal motor or sensory loss. Cognition improving. Incision/Wound: wound clean and intact.  Seems to be tolerating movement a bit better.   Assessment/Plan: 1. Functional deficits secondary to pathologic L3 fx, s/p corpectomy/vertebrectomy which require 3+ hours per day of interdisciplinary therapy in a comprehensive inpatient rehab setting. Physiatrist is providing close team supervision and 24 hour management of active medical problems listed below. Physiatrist and rehab team continue to assess barriers to discharge/monitor patient progress toward functional and medical goals  Working toward d/c.  Could he be ready at the end of the therapy day today or tomorrow am?  Mobility: Bed Mobility Bed Mobility: Yes Rolling Right: 5: Supervision Rolling Right Details (indicate cue type and reason): pt demonstrated logroll without cues Rolling Left: 5: Supervision;With rail Right Sidelying to Sit: 5: Supervision Left Sidelying to Sit: 5: Supervision Sitting - Scoot to Edge of Bed: 5: Supervision Sit to Supine - Left: 5: Supervision Transfers Transfers: Yes Sit to Stand: 5: Supervision;With armrests Stand to Sit: 5: Supervision Stand Pivot Transfers: 4: Min assist Ambulation/Gait Ambulation/Gait Assistance: 5: Supervision Ambulation/Gait Assistance Details (indicate cue type and reason): gait training with out RW min assist  Ambulation Distance (Feet): 150 Feet Assistive device: Rolling walker;Other (Comment) (LSO) Gait Pattern: Step-through pattern;Trunk flexed Gait velocity: WFL Stairs: Yes Stairs Assistance: 5: Supervision Stairs Assistance Details (indicate cue type and reason): initiated stair training sideways with both hands on  one rail due to pt reporting feeling like  he is falling forward during descent. Pt verbalized feeling "more secure" with sideways technique. Stair Management Technique: One rail Right Number of Stairs: 15  Height of Stairs:  (4-6 inch steps) Wheelchair Mobility Wheelchair Mobility: No Wheelchair Assistance: 4: Systems analyst: Both upper extremities Distance: 60 ADL:   Cognition: Cognition Overall Cognitive Status: Appears within functional limits for tasks assessed Arousal/Alertness: Awake/alert Orientation Level: Oriented X4 Attention: Divided (stopped walking when answering a question) Focused Attention: Appears intact Sustained Attention: Appears intact Selective Attention: Appears intact Alternating Attention: Appears intact Memory: Impaired (repeated stories of military service 2x in 15 min) Awareness: Appears intact Problem Solving: Impaired Problem Solving Impairment: Functional basic (new learning) Behaviors: Lability (tearful during eval) Safety/Judgment: Appears intact Cognition Arousal/Alertness: Awake/alert Orientation Level: Oriented X4  1.  DVT Prophylaxis/Anticoagulation: Mechanical:  Antiembolism stockings, knee (TED hose) Bilateral lower extremities and lovenox to coumadin. INR  Therapeutic 2. Pain Management: Tylenol, robaxin and oxycodone. Patient prefers to use minimal pain medication over fears of addiction. I encouraged the patient to utilize pain medication as needed given his history and pain symptoms.  Patient seems to have done fairly well with this so far.  He uses good technique with movements which minimizes pain.  3. Mood: Team will provide ego support.  Patient having some issues coping with this hospitalization and medical situation.  4. Constipation: Scheduled Senokot-S with as needed MiraLAX and Dulcolax suppository.   5. multiple myeloma: Workup per Dr. Arline Asp. Will discuss treatment plan although I suspect treatment will  begin after discharge from rehabilitation.   6. history of orthostasis: Push by mouth fluids. TED stockings will be used as well.   7. anemia: This is due to his postoperative blood loss as well as his multiple myeloma. Iron supplementation daily. Check serial CBCs. We'll transfuse patient as appropriate. Encouraged dietary intake of iron. Hgb holding stable at present  8. Leukocytosis:  Afebrile. Wound clean.  Urine cx negative.  Encourage IS.         SWARTZ,ZACHARY T 01/22/2011, 7:03 AM

## 2011-01-22 NOTE — Discharge Summary (Signed)
Rodney [name], Rodney [name]            ACCOUNT NO.:  [id number]  MEDICAL RECORD NO.:  [id number]  LOCATION:  4004                         FACILITY:  [facility]  PHYSICIAN:  Ranelle Oyster, M.D.DATE OF BIRTH:  [birth year]  DATE OF ADMISSION:  01/15/2011 DATE OF DISCHARGE:  01/22/2011                              DISCHARGE SUMMARY   DISCHARGE DIAGNOSES: 1. Pathological fracture L3 secondary to multiple myeloma metastases. 2. Constipation resolved. 3. Orthostasis resolved. 4. Acute blood loss anemia.  HISTORY OF PRESENT ILLNESS:  [name] is a 70 year old male who developed acute low back pain with inability to stand on January 01, 2011.  The patient with history of prostate cancer, as well as there were concerns about multiple myeloma with L3 and sacral lesions since September 2006.  X-rays done had shown these not to be significantly changed.  CT of spine done at admission revealed L3 pathological fracture.  The patient was evaluated by Dr. Arline Asp and Dr. Franky Macho. He underwent L3 corpectomy with vertebrectomy by Dr. Franky Macho on January 09, 2011, and was on bedrest for 2 days.  PT eval done revealed the patient to be limited by pain as well as weakness and decreased balance. The patient was noted to have acute blood loss anemia postop requiring 4 units of packed red blood cells as well as a unit of fresh frozen plasma.  The patient on chronic Coumadin secondary to history of PEs. The patient's anticoagulation has been recently restarted.  He was placed on heparin drip and transitioned to Coumadin.  The patient's anemia is felt to be acute on chronic due to his multiple myeloma symptoms exacerbated by intraoperative blood loss.  Fevers have currently resolved without any signs or symptoms of active infection. His hospital course has also been complicated by orthostatic hypotension as well as constipation issues.  REVIEW OF SYSTEMS:  Positive for constipation, back pain,  dizziness as well as anxiety and nervousness.  PAST MEDICAL HISTORY:  Positive for: 1. PE. 2. Prostate cancer. 3. Pancreatitis. 4. Multiple myeloma. 5. Degenerative disk disease.  PAST SURGICAL HISTORY:  Positive for cholecystectomy in February 2012.  FAMILY HISTORY:  Positive for father having lymphoma and mother with clotting disorder.  ALLERGIES:  No known drug allergies.  SOCIAL HISTORY:  The patient is married.  Reports he quit smoking times few years and does not use any alcohol or illicit drugs.  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  He lives in a second level apartment with 10 stairs to entry.  FUNCTIONAL HISTORY:  The patient was independent with home making ambulation.  Was working full time.  Did not require any assistive device.  FUNCTIONAL STATUS:  The patient is min assist for transfers.  Min guard assist for ambulating 50 feet with cues for safety and upright posture.  PHYSICAL EXAMINATION:  GENERAL:  The patient is a well-nourished, well- developed male in no acute distress.  HEENT:  Normocephalic without obvious abnormality, atraumatic.  Pupils equal, round, reactive to light.  Extraocular movement intact.  Hearing intact.  Oral mucosa pink and moist with good dentition. NECK:  Supple without JVD or lymphadenopathy. BACK:  Tender with any movement.  Lower back incision is  clean, dry, and intact.  No erythema noted. LUNGS:  Clear to auscultation bilaterally with good effort. HEART:  Regular rate and rhythm without murmurs, gallops, or rubs. ABDOMEN:  Soft, nontender with positive bowel sounds. EXTREMITIES:  Normal.  No cyanosis or edema noted. NEUROLOGIC:  The patient is alert and oriented x3.  Cranial nerve exam is normal.  He moves upper extremities 4/5 strength, a bit more weakness proximally due to back pain.  His lower extremity strength is closely 3/5 proximally and 5/5 distally.  Reflexes a bit hyper reflexive on lower extremities at  2+.  HOSPITAL COURSE:  [name] was admitted to rehab on January 15, 2011, for inpatient therapies to consist of PT, OT at least 3 hours 5 days a week.  Past admission, physiatrist, rehab RN, and therapy team have worked together to provide customized collaborative interdisciplinary care.  Rehab RN has worked with the patient on bowel and bladder program as well as assisted with pain management.  The patient's blood pressures were monitored on b.i.d. basis, and his systolics have ranged from mid 90s to 130s, diastolic in 50s to 64.  The patient has had no symptoms of orthostasis during this stay.  The patient's pain was relatively controlled with use of oxycodone prior to his therapy sessions.  His Tylenol was also scheduled to help with pain relief and decrease side effects of narcotics.  LABORATORY DATA:  Were done past admission for followup on his acute blood loss anemia.  His H and H at admission was at 9.1.  Recheck of January 18, 2011, shows some improvement with hemoglobin at 9.4 and hematocrit at 30.3.  Leukocytosis is resolving with white count 11.9, platelets at 434.  Check of lytes at admission revealed sodium 137, potassium 3.7, chloride 105, CO2 22, BUN 11, creatinine 0.8, and glucose 107.  UA and UC was done secondary to leukocytosis.  UA was negative and urine culture done showed 8000 colonies insignificant growth.  The patient has been afebrile during this stay.  The patient was maintained on Coumadin throughout his stay.  PT/INR at the time of discharge were 32.1 and 3.06.  The patient is discharged on 2.5 mg for the next 2 days and he is to follow up with Dr. Lovell Sheehan on Wednesday for repeat pro- time.  During patient's stay in rehab, weekly team conferences were held to monitor patient's progress, set goals, as well as discuss barriers to discharge.  Physical Therapy has worked with the patient on gait training as well as strengthening and activity  tolerance.  At the time of discharge, the patient was independent for all mobility in supervised setting.  He is able to ambulate 50 minutes with rolling walker at distant supervision on unit.  He is able to ascend and descend 12 steps with 1 rail at supervision level.  Wife has been very supportive and has been present for multiple therapy sessions.  Family education was done with wife regarding supervision needed and safety.  OT has worked with the patient on self-care tasks.  They have utilized Adaptic equipment for completing lower body bathing and dressing and to help compliance with his back precautions.  Further followup outpatient physical therapy to be done at [facility], and we will contact him with the appointment date and time.  On January 22, 2011, the patient is discharged to home.  DISCHARGE MEDICATIONS: 1. Trinsicon one p.o. t.i.d. 2. Ultram 50 mg p.o. q.i.d. p.r.n. mild pain. 3. Tylenol 500 mg 1-2  p.o. q.i.d. p.r.n. pain. 4. Robaxin 500 mg p.o. q.8 h. p.r.n. spasms. 5. OxyIR 5 mg 1-2 p.o. q.4 h. p.r.n. moderate-to-severe pain #60     Rx'ed. 6. MiraLAX 17 g in 8 ounces p.o. per day p.r.n. constipation. 7. Coumadin 5 mg half p.o. q.p.m.  DIET:  Regular.  ACTIVITY LEVEL:  Independent in supervised setting.  Maintain back precautions.  Use walker for ambulation.  SPECIAL INSTRUCTIONS:  No alcohol, no driving, no strenuous activity. Wear brace when at edge of bed and out of bed.  FOLLOWUP:  The patient to follow up with Dr. Delma Officer on Wednesday for protime check and also for routine check in the next couple of weeks.  Follow up with Dr. Coletta Memos for postop check in a couple of weeks.  Follow up with Dr. Arline Asp for input regarding further treatment of multiple myeloma.     Delle Reining, P.A.   ______________________________ Ranelle Oyster, M.D.    PL/MEDQ  D:  01/22/2011  T:  01/22/2011  Job:  [id number]  cc:   Samul Dada,  M.D. Coletta Memos, M.D. [name], M.D.

## 2011-01-22 NOTE — Progress Notes (Signed)
Social Work Discharge Note Discharge Note  The overall goal for the admission was met for:   Discharge location: Yes-Home with wife to assist  Length of Stay: Yes- 8 days  Discharge activity level: Yes-mod/I Level rolling walker Home/community participation: Yes  Services provided included: MD, RD, PT, OT, RN, CM, TR, Pharmacy and SW  Financial Services: Medicare  Follow-up services arranged: Outpatient: [facility]  Comments (or additional information):[facility]-DME rolling walker, tub bench  Patient/Family verbalized understanding of follow-up arrangements: Yes  Individual responsible for coordination of the follow-up plan: Self and Margaret-wife  Confirmed correct DME delivered: Lucy Chris 01/22/2011    Lucy Chris

## 2011-01-22 NOTE — Discharge Summary (Signed)
Discharge summary # [id number]#[id number]

## 2011-01-22 NOTE — Progress Notes (Signed)
Physical Therapy Discharge Summary  Patient Details  Name: ISMAEEL ARVELO MRN: [id number] Date of Birth: 1941-02-20 Today's Date: 01/22/2011 Treatment 1:  09:00 until 10:00  GRAD DAY!  Gait training controlled enviroment 150 feeet, home enviroment 29' mod I., up and down 20 steps with 2 handrails mod. I, family education complete and pt educated to perform HEP to pain tolerance. Pt. Initially required min assist with poor safety awareness and postural control. Pt. Will dc home with his wife at an overall mod. I level using a RW. Pt. Will continue follow up with outpatient therapy before return to work to address high level balance without AD and body mechanics.  Patient has met 8 of 8 long term goals due to improved postural control.  Patient to discharge at an ambulatory level Modified Independent.   Patient's care partner is independent to provide the necessary physical assistance at discharge.  Recommendation:  Patient will benefit from ongoing skilled PT services in outpatient setting to continue to advance safe functional mobility, address ongoing impairments in balance, activity tolerance, strength, and minimize fall risk.  Equipment: Equipment provided: rw  Patient/family agrees with progress made and goals achieved: Yes  Julian Reil 01/22/2011, 11:24 AM

## 2011-01-22 NOTE — Progress Notes (Signed)
Bed Alarm on for Patient safety

## 2011-01-22 NOTE — Progress Notes (Signed)
Occupational Therapy Discharge Summary and Therapy Session Note  Patient Details  Name: Rodney [name] MRN: [id number] Date of Birth: 1941-02-15 Today's Date: 01/22/2011  Therapy Session Note Time In: 1000 Time Out: 1100 Time Calculation: 60 min Individual Therapy Pain Assessment: Pain - 3 Location - Back No medication requested  ADL retraining including shower in ADL apt and dressing seated from edge of tub bench.  Wife present and assisted with donning TED hose.  Pt completed bathing with sit to stand from tub bench in shower.  Pt used long handle brush for lower legs and back.  Elastic shoe laces were issued for shoes to increase pt independence with dressing.  Pt is mod I for bathing and dressing tasks and transfers.  Focus on activity tolerance  Discharge Summary Patient has met 9 of 9 long term goals. Pt will discharge home with wife to provide supervision PRN.  Pt is independent with grooming and UB dressing, mod I with bathing, LB dressing, toilet and shower transfers, and toileting.  Pt is supervision for simple meal prep.  Wife has been present for therapy sessions and has demonstrated safe and appropriate assistance/supervision PRN. Pt does not require any more skilled OT at this time. Pt has made improvements in balance, activity tolerance and using AE with LB clothing management. Pt plans to return to work soon.   Equipment: Equipment provided: [employer] agrees with progress made and goals achieved: Yes  Rich Brave 01/22/2011, 2:02 PM

## 2011-01-22 NOTE — Progress Notes (Signed)
ANTICOAGULATION CONSULT NOTE - Follow Up Consult  Pharmacy Consult:  Coumadin Indication: h/o PE  No Known Allergies  Patient Measurements: Height: 5\' 8"  (172.7 cm) Weight: 199 lb 4.8 oz (90.402 kg) IBW/kg (Calculated) : 68.4    Vital Signs: Temp: 97.7 F (36.5 C) (11/19 0458) Temp src: Oral (11/19 0458) BP: 93/54 mmHg (11/19 0506) Pulse Rate: 70  (11/19 0506)  Labs:  Basename 01/22/11 0705 01/21/11 0835 01/20/11 0750  HGB -- -- --  HCT -- -- --  PLT -- -- --  APTT -- -- --  LABPROT 32.1* 28.5* 31.9*  INR 3.06* 2.63* 3.03*  HEPARINUNFRC -- -- --  CREATININE 0.78 -- --  CKTOTAL -- -- --  CKMB -- -- --  TROPONINI -- -- --   Estimated Creatinine Clearance: 96.5 ml/min (by C-G formula based on Cr of 0.78).   Assessment: 70 YOM on Coumadin for h/o PE.  INR at high end of goal so will decrease dose today.      Goal of Therapy:  INR 2-3   Plan:  1.  Coumadin 2mg  PO today. 2.  INR in AM.  Laney Potash, Irby Fails Dien 01/22/2011,11:17 AM

## 2011-01-22 NOTE — Progress Notes (Signed)
Therapeutic Recreation Discharge Summary Patient Details  Name: Rodney [name] MRN: [id number] Date of Birth: 01-28-1941  Long term goals set: 1  Long term goals met: 1   Comments on progress toward goals: Pt requesting early discharge today versus 01/24/11.  Pt participated in community re-entry activity with focus on safe mobility, energy conservation techniques and obstacle negotiation at ambulatory level.  Pt being discharged today at supervision level using RW.  Reasons goals not met: n/a  Reasons for discharge: discharge from hospital  Patient/family agrees with progress made and goals achieved: Yes  Rodney [name] 01/22/2011, 9:47 AM

## 2011-01-23 ENCOUNTER — Ambulatory Visit: Payer: Medicare Other

## 2011-01-23 ENCOUNTER — Ambulatory Visit: Payer: Medicare Other | Admitting: Internal Medicine

## 2011-01-23 ENCOUNTER — Ambulatory Visit: Payer: Medicare Other | Attending: Physical Medicine & Rehabilitation

## 2011-01-23 ENCOUNTER — Telehealth: Payer: Self-pay | Admitting: Oncology

## 2011-01-23 DIAGNOSIS — R262 Difficulty in walking, not elsewhere classified: Secondary | ICD-10-CM | POA: Insufficient documentation

## 2011-01-23 DIAGNOSIS — R5381 Other malaise: Secondary | ICD-10-CM | POA: Insufficient documentation

## 2011-01-23 DIAGNOSIS — Z86718 Personal history of other venous thrombosis and embolism: Secondary | ICD-10-CM

## 2011-01-23 DIAGNOSIS — Z7901 Long term (current) use of anticoagulants: Secondary | ICD-10-CM

## 2011-01-23 DIAGNOSIS — M545 Low back pain, unspecified: Secondary | ICD-10-CM | POA: Insufficient documentation

## 2011-01-23 DIAGNOSIS — M6281 Muscle weakness (generalized): Secondary | ICD-10-CM | POA: Insufficient documentation

## 2011-01-23 DIAGNOSIS — IMO0001 Reserved for inherently not codable concepts without codable children: Secondary | ICD-10-CM | POA: Insufficient documentation

## 2011-01-23 NOTE — Patient Instructions (Signed)
°  Latest dosing instructions  ° Total Sun Mon Tue Wed Thu Fri Sat  ° 30 5 mg 5 mg 2.5 mg 5 mg 5 mg 2.5 mg 5 mg  °  (5 mg×1) (5 mg×1) (5 mg×0.5) (5 mg×1) (5 mg×1) (5 mg×0.5) (5 mg×1)  °  °  ° ° °

## 2011-01-23 NOTE — Telephone Encounter (Signed)
Wife lmonvm requesting appt w/DM. Says pt seen by DM some time ago, just had surgery and has myeloma. Pt last see jan 2010 message given to robin/tiffany. Wife is margaret and can be reached @ [phone]

## 2011-01-24 ENCOUNTER — Encounter: Payer: Medicare Other | Admitting: Physical Therapy

## 2011-01-24 ENCOUNTER — Ambulatory Visit: Payer: Medicare Other

## 2011-01-29 ENCOUNTER — Encounter: Payer: Self-pay | Admitting: *Deleted

## 2011-01-29 ENCOUNTER — Ambulatory Visit (INDEPENDENT_AMBULATORY_CARE_PROVIDER_SITE_OTHER): Payer: Medicare Other | Admitting: Internal Medicine

## 2011-01-29 ENCOUNTER — Ambulatory Visit: Payer: Medicare Other

## 2011-01-29 ENCOUNTER — Encounter: Payer: Self-pay | Admitting: Internal Medicine

## 2011-01-29 ENCOUNTER — Telehealth: Payer: Self-pay | Admitting: *Deleted

## 2011-01-29 VITALS — BP 116/70 | HR 72 | Temp 98.3°F | Resp 16 | Ht 71.0 in | Wt 198.0 lb

## 2011-01-29 DIAGNOSIS — K4090 Unilateral inguinal hernia, without obstruction or gangrene, not specified as recurrent: Secondary | ICD-10-CM

## 2011-01-29 DIAGNOSIS — C9000 Multiple myeloma not having achieved remission: Secondary | ICD-10-CM

## 2011-01-29 NOTE — Assessment & Plan Note (Addendum)
Left indirect diagnosed in 2010 Increased pain, reducible and relief of pressure Constipation post operatively has increased the hernia

## 2011-01-29 NOTE — Progress Notes (Signed)
Subjective:    Patient ID: Rodney [name], male    DOB: [birth year], 70 y.o.   MRN: [id number]  HPI  The patient is status post neurosurgical intervention for back pain had a biopsy of the cyst and the bone which came back plasmacytoma consistent with multiple myeloma.  Has a referral to oncology to start chemotherapy for multiple myeloma. He is generally doing well post neurosurgical intervention is wearing back brace has been walking without the walker. Pain control is reasonable the patient does not desire to use oxycodone. Patient has a history of angle hernia with constipation following surgery his inguinal hernia on the left has worsened but he states it is reducible. He does not desire surgical intervention at this point until after the recovery from his back surgery and whatever treatment he intervention is necessary for the multiple myeloma He had a bx of L 3 The prior lytic lesion was seen in the sacrum appears to be stable  He had a pathologic fracture from the multiple myeloma involvement of the L3 vertebrae Staging bone scan as well as MRI may show the lytic lesion at L3 and the sacrum  The patient also has a large left indirect inguinal hernia that has increased in size and is operative.  Review of Systems  Constitutional: Negative for fever and fatigue.  HENT: Negative for hearing loss, congestion, neck pain and postnasal drip.   Eyes: Negative for discharge, redness and visual disturbance.  Respiratory: Negative for cough, shortness of breath and wheezing.   Cardiovascular: Negative for leg swelling.  Gastrointestinal: Negative for abdominal pain, constipation and abdominal distention.  Genitourinary: Negative for urgency and frequency.  Musculoskeletal: Negative for joint swelling and arthralgias.  Skin: Negative for color change and rash.  Neurological: Negative for weakness and light-headedness.  Hematological: Negative for adenopathy.  Psychiatric/Behavioral:  Negative for behavioral problems.   Past Medical History  Diagnosis Date  . Pulmonary embolism   . Prostate cancer   . Pancreatitis   . Multiple myeloma   . Pulmonary embolism   . Degenerative disc disease   . Anemia     History   Social History  . Marital Status: Married    Spouse Name: N/A    Number of Children: N/A  . Years of Education: N/A   Occupational History  . Not on file.   Social History Main Topics  . Smoking status: Former Games developer  . Smokeless tobacco: Not on file  . Alcohol Use: No  . Drug Use: No  . Sexually Active: Yes   Other Topics Concern  . Not on file   Social History Narrative   MarriedRegular exercise-yes    Past Surgical History  Procedure Date  . Cholecystectomy 04/06/2010  . Back surgery     Family History  Problem Relation Age of Onset  . Lymphoma Father 10  . Cancer Father   . Clotting disorder Mother     blood clots    No Known Allergies  Current Outpatient Prescriptions on File Prior to Visit  Medication Sig Dispense Refill  . acetaminophen (TYLENOL) 500 MG tablet Take 1-2 tablets (500-1,000 mg total) by mouth every 6 (six) hours as needed for pain. For pain  30 tablet  1  . ferrous fumarate-b12-vitamic C-folic acid (TRINSICON / FOLTRIN) capsule Take 1 capsule by mouth 3 (three) times daily after meals.  90 capsule  1  . methocarbamol (ROBAXIN) 500 MG tablet Take 1 tablet (500 mg total) by mouth every 8 (eight) hours  as needed (as needed for muscle spasms.).  60 tablet  1  . oxyCODONE (OXY IR/ROXICODONE) 5 MG immediate release tablet Take 1-2 tablets (5-10 mg total) by mouth every 4 (four) hours as needed for pain (for moderate to severe pain.).  60 tablet  0  . traMADol (ULTRAM) 50 MG tablet Take 1 tablet (50 mg total) by mouth every 6 (six) hours as needed for pain (use as needed for mild pain.). Maximum dose= 8 tablets per day  30 tablet  1  . warfarin (COUMADIN) 5 MG tablet To be titrated by phramacy, goal inr 2-3  30 tablet   6    BP 116/70  Pulse 72  Temp 98.3 F (36.8 C)  Resp 16  Ht 5\' 11"  (1.803 m)  Wt 198 lb (89.812 kg)  BMI 27.62 kg/m2       Objective:   Physical Exam  Nursing note and vitals reviewed. Constitutional: He appears well-developed and well-nourished.  HENT:  Head: Normocephalic and atraumatic.  Eyes: Conjunctivae are normal. Pupils are equal, round, and reactive to light.  Neck: Normal range of motion. Neck supple.  Cardiovascular: Normal rate and regular rhythm.   Pulmonary/Chest: Effort normal and breath sounds normal.  Abdominal: Soft. Bowel sounds are normal.   Large left inguinal hernia that is not easily reduced       Assessment & Plan:  Agree with aggressive plan for treatment of multiple myeloma Refer to oncology begin radiation oncology for treatment of lytic lesions and followup chemotherapy he has an appointment with the oncologist. He was seen by oncology in 2009. Refer to general surgery or treatment of large left inguinal hernia I believe that surgery is necessary and will need to be considered very soon.

## 2011-01-29 NOTE — Patient Instructions (Signed)
The patient is instructed to continue all medications as prescribed. Schedule followup with check out clerk upon leaving the clinic  

## 2011-01-29 NOTE — Telephone Encounter (Signed)
CALLED PATIENT ON THE WORK PHONE PATIENT CONFIRMED 02-12-2011 AT 11:00AM

## 2011-01-29 NOTE — Assessment & Plan Note (Signed)
The pt has  

## 2011-01-30 ENCOUNTER — Encounter: Payer: Self-pay | Admitting: Radiation Oncology

## 2011-01-30 ENCOUNTER — Ambulatory Visit
Admit: 2011-01-30 | Discharge: 2011-01-30 | Disposition: A | Discharge status: Home / Self Care | Payer: Medicare Other | Attending: Radiation Oncology | Admitting: Radiation Oncology

## 2011-01-30 VITALS — BP 104/71 | HR 74 | Temp 97.7°F | Resp 18 | Ht 71.0 in | Wt 200.8 lb

## 2011-01-30 DIAGNOSIS — C888 Other malignant immunoproliferative diseases not having achieved remission: Secondary | ICD-10-CM | POA: Insufficient documentation

## 2011-01-30 DIAGNOSIS — Z510 Encounter for antineoplastic radiation therapy: Secondary | ICD-10-CM | POA: Insufficient documentation

## 2011-01-30 DIAGNOSIS — C61 Malignant neoplasm of prostate: Secondary | ICD-10-CM

## 2011-01-30 DIAGNOSIS — C9000 Multiple myeloma not having achieved remission: Secondary | ICD-10-CM

## 2011-01-30 NOTE — Progress Notes (Signed)
Follow-up note:  Mr. Foot visits today for a followup visit in the management of his multiple myeloma/multiple plasmacytomas. I saw the patient in consultation on 01/02/2011. We'll follow my consultation he had a MRI scan on 01/01/2011 which showed marked progression of disease at L3 with retropulsion of tumor posteriorly. There was mass effect upon the origin of the left L4 nerve root. A destructive lesion was seen along the left aspect of the sacrum. On 01/05/2011 the patient underwent a L3 vertebrectomy with arthrodesis and instrumentation for stabilization of the L3 vertebra. Pathology was diagnostic for plasmacytoma/plasma cell myeloma. He was admitted to rehabilitation for one week and discharged on 01/22/2011. While he was hospitalized he noted progression of a left inguinal hernia and he has an appointment to see Dr.Gerkin on December 12. An appointment with Dr. Arline Asp is pending. He states that his low back pain is 4-5/10. He has avoid narcotics, but does take Robaxin, Tylenol, and tramadol. He has no difficulty with bladder or bowel control.  Physical examination: Alert and oriented. Weight 200 pounds. Blood pressure 104/71 pulse 74 respiratory rate 20 temperature 97.7 and neck examination grossly unremarkable. Nodes no palpable cervical or supraclavicular lymphadenopathy. Chest lungs clear. Heart regular rate and rhythm. Back there is a central wound along the lumbar spine extending from approximately L2-L4. The wound is healing well. Abdomen without hepatomegaly. There is a left inguinal hernia about the size of a lemon. Extremities without edema. Neurologic examination: Lower extremity strength is good and deep tendon reflexes are 2+ at the knees. Sensation normal to light touch.  Labs:  Lab Results  Component Value Date   WBC 11.9* 01/18/2011   HGB 9.4* 01/18/2011   HCT 30.3* 01/18/2011   MCV 93.2 01/18/2011   PLT 434* 01/18/2011    Impression/Plan:  Multiple  myeloma/plasmacytomas. Mr. Meadow should undergo postoperative ration therapy to his lumbar spine and sacrum. He may need surgery for his left inguinal hernia sooner than later. The timing of surgery to complicate plans for chemotherapy and/or radiation therapy. I'll contact Dr. Arline Asp to have him way in on his management. It may be that he can proceed with surgery in the near future and then move ahead with his radiation therapy/chemotherapy. I discussed the potential acute and late toxicities of radiation therapy and he wishes to proceed as outlined. Consent was signed today. 30 minutes was spent face-to-face with the patient, primarily counseling the patient.

## 2011-01-30 NOTE — Progress Notes (Signed)
Please see the Nurse Progress Note in the MD Initial Consult Encounter for this patient. 

## 2011-01-30 NOTE — Progress Notes (Signed)
PATIENT HERE FOR MULTIPLE MYELOMA, BACK PAIN WITH SURGERY TO L3 SACRAL PAIN ALSO

## 2011-01-31 ENCOUNTER — Telehealth: Payer: Self-pay | Admitting: *Deleted

## 2011-01-31 ENCOUNTER — Encounter: Payer: Medicare Other | Admitting: Physical Therapy

## 2011-01-31 NOTE — Progress Notes (Signed)
Encounter addended by: Amanda Pea, RN on: 01/31/2011  5:15 PM<BR>     Documentation filed: Charges VN

## 2011-01-31 NOTE — Telephone Encounter (Signed)
called patient and confirmed appointment for the 02-12-2011 with dr.rubin patient confirmed over the phone on 01-31-2011

## 2011-02-01 ENCOUNTER — Encounter: Payer: Self-pay | Admitting: *Deleted

## 2011-02-01 ENCOUNTER — Encounter (INDEPENDENT_AMBULATORY_CARE_PROVIDER_SITE_OTHER): Payer: Self-pay | Admitting: Surgery

## 2011-02-01 ENCOUNTER — Ambulatory Visit (INDEPENDENT_AMBULATORY_CARE_PROVIDER_SITE_OTHER): Payer: Medicare Other | Admitting: Surgery

## 2011-02-01 DIAGNOSIS — D689 Coagulation defect, unspecified: Secondary | ICD-10-CM

## 2011-02-01 DIAGNOSIS — K4090 Unilateral inguinal hernia, without obstruction or gangrene, not specified as recurrent: Secondary | ICD-10-CM

## 2011-02-01 NOTE — Progress Notes (Signed)
CSW was referred by distress screening protocol. Pt scored a 4 on DT. CSW unable to meet with pt in exam room, therefore, CSW called pt at home. CSW spoke with the pt and his wife, and share information on the Patient and Family Support Team members and resources.  Pt's wife stated the pt had a surgery scheduled,and would not know his tx plan until after surgery.  CSW and pt's wife discussed the patient and family support team calendar,and CSW encouraged pt and pt's wife to contact any of the support team members with any on-medical needs or concerns.  Pt's wife was appreciative of contact.

## 2011-02-01 NOTE — Progress Notes (Signed)
Chief Complaint  Patient presents with  . New Evaluation    symptomatic left inguinal hernia - referral by Dr. Chipper Herb    HISTORY: Patient is a 70 year old white male who seen at the request of his radiation oncologist for a symptomatic left inguinal hernia. Patient is being treated for multiple myeloma. He is anticipating further chemotherapy and radiation therapy in the near future. Unfortunately he has developed a symptomatic left inguinal hernia which will require repair. So as not to interfere with his chemotherapy or radiation therapy, he would like to proceed with left inguinal hernia repair in the near future.  The patient notes that hernias been present for approximately one year. It does cause pain with physical exertion. It is always been reducible. He has not noted any signs or symptoms of incarceration or obstruction. Patient has not had any prior hernia repairs. Prior abdominal surgery includes prostate surgery and cholecystectomy and appendectomy.   Past Medical History  Diagnosis Date  . Pulmonary embolism   . Prostate cancer   . Pancreatitis   . Multiple myeloma   . Pulmonary embolism   . Degenerative disc disease   . Anemia   . Hernia      Current Outpatient Prescriptions  Medication Sig Dispense Refill  . acetaminophen (TYLENOL) 500 MG tablet Take 1-2 tablets (500-1,000 mg total) by mouth every 6 (six) hours as needed for pain. For pain  30 tablet  1  . methocarbamol (ROBAXIN) 500 MG tablet Take 1 tablet (500 mg total) by mouth every 8 (eight) hours as needed (as needed for muscle spasms.).  60 tablet  1  . omeprazole (PRILOSEC) 20 MG capsule Take 20 mg by mouth 2 (two) times daily.        Marland Kitchen oxyCODONE (OXY IR/ROXICODONE) 5 MG immediate release tablet Take 1-2 tablets (5-10 mg total) by mouth every 4 (four) hours as needed for pain (for moderate to severe pain.).  60 tablet  0  . traMADol (ULTRAM) 50 MG tablet Take 1 tablet (50 mg total) by mouth every 6 (six)  hours as needed for pain (use as needed for mild pain.). Maximum dose= 8 tablets per day  30 tablet  1  . warfarin (COUMADIN) 5 MG tablet To be titrated by phramacy, goal inr 2-3  30 tablet  6  . ferrous fumarate-b12-vitamic C-folic acid (TRINSICON / FOLTRIN) capsule Take 1 capsule by mouth 3 (three) times daily after meals.  90 capsule  1     No Known Allergies   Family History  Problem Relation Age of Onset  . Lymphoma Father 24  . Cancer Father   . Clotting disorder Mother     blood clots     History   Social History  . Marital Status: Married    Spouse Name: N/A    Number of Children: N/A  . Years of Education: N/A   Social History Main Topics  . Smoking status: Former Games developer  . Smokeless tobacco: None   Comment: quit 50 yrs  . Alcohol Use: No  . Drug Use: No  . Sexually Active: Yes   Other Topics Concern  . None   Social History Narrative   MarriedRegular exercise-yes     REVIEW OF SYSTEMS - PERTINENT POSITIVES ONLY: Pain with physical exertion. Always reducible.  No signs or symptoms of intestinal obstruction.   EXAM: Filed Vitals:   02/01/11 1550  BP: 112/72  Pulse: 100  Temp: 98 F (36.7 C)  Resp: 18  HEENT: normocephalic; pupils equal and reactive; sclerae clear; dentition good; mucous membranes moist NECK:  No nodules; symmetric on extension; no palpable anterior or posterior cervical lymphadenopathy; no supraclavicular masses; no tenderness CHEST: clear to auscultation bilaterally without rales, rhonchi, or wheezes CARDIAC: regular rate and rhythm without significant murmur; peripheral pulses are full GU:  Normal male without obvious lesion. Palpation in the right inguinal canal shows no sign of hernia with cough and Valsalva. Umbilicus shows no sign of hernia. Left groin shows an obvious bulge. Palpation in the inguinal canal shows a hernia which augments with cough and Valsalva. It is reducible. No masses on penis or  testicles. EXT:  non-tender without edema; no deformity NEURO: no gross focal deficits; no sign of tremor   LABORATORY RESULTS: See E-Chart for most recent results   RADIOLOGY RESULTS: See E-Chart or I-Site for most recent results   IMPRESSION: #1 reducible left inguinal hernia, symptomatic #2 multiple myeloma #3 chronic anti-coagulation due to recurrent episodes of pulmonary embolism   PLAN: I discussed the above findings with the patient and his wife. We reviewed left inguinal hernia repair in the use of prosthetic mesh. I demonstrated to him the type of mesh that would be employed. We discussed alternative treatment methods. We discussed the possibility of recurrence being approximately 2%. We discussed restrictions on his activities after the procedure  They understand and wish to proceed.  Patient will require bridging of his anti-coagulation with intravenous heparin. He will require admission to the hospital a few days prior to his surgical procedure. He will likely need to stay in the hospital for a few days following his procedure to reinstitute therapeutic levels of Coumadin. I will contact Dr. Darryll Capers, his primary care physician, to help facilitate this process.  The risks and benefits of the procedure have been discussed at length with the patient.  The patient understands the proposed procedure, potential alternative treatments, and the course of recovery to be expected.  All of the patient's questions have been answered at this time.  The patient wishes to proceed with surgery and will schedule a date for their procedure through our office staff.   Velora Heckler, MD, FACS General & Endocrine Surgery [facility]    Visit Diagnoses: 1. INGUINAL HERNIA   2. Clotting disorder     Primary Care Physician: [name], MD  Oncology:  Pierce Crane, MD Radiation Oncology:  Chipper Herb, MD

## 2011-02-02 ENCOUNTER — Ambulatory Visit: Payer: Medicare Other | Admitting: Physical Therapy

## 2011-02-06 ENCOUNTER — Ambulatory Visit: Payer: Medicare Other | Attending: Physical Medicine & Rehabilitation | Admitting: Physical Therapy

## 2011-02-06 DIAGNOSIS — R5381 Other malaise: Secondary | ICD-10-CM | POA: Insufficient documentation

## 2011-02-06 DIAGNOSIS — M545 Low back pain, unspecified: Secondary | ICD-10-CM | POA: Insufficient documentation

## 2011-02-06 DIAGNOSIS — R262 Difficulty in walking, not elsewhere classified: Secondary | ICD-10-CM | POA: Insufficient documentation

## 2011-02-06 DIAGNOSIS — M6281 Muscle weakness (generalized): Secondary | ICD-10-CM | POA: Insufficient documentation

## 2011-02-06 DIAGNOSIS — IMO0001 Reserved for inherently not codable concepts without codable children: Secondary | ICD-10-CM | POA: Insufficient documentation

## 2011-02-07 NOTE — Progress Notes (Signed)
Please start him on Lovenox  by Dec 12th or 5 days prior to surgery Dr Sid Falcon office will call with the exact date protime prior day prior to surgery Lovenox 80mg  BID for this high risk patient  After surgery either lovenox or heparin protocol then resum coumadin and consider xaralto?

## 2011-02-08 ENCOUNTER — Ambulatory Visit: Payer: Medicare Other

## 2011-02-09 ENCOUNTER — Other Ambulatory Visit (INDEPENDENT_AMBULATORY_CARE_PROVIDER_SITE_OTHER): Payer: Self-pay | Admitting: Surgery

## 2011-02-09 ENCOUNTER — Other Ambulatory Visit: Payer: Self-pay | Admitting: *Deleted

## 2011-02-09 MED ORDER — ENOXAPARIN SODIUM 80 MG/0.8ML ~~LOC~~ SOLN: 80.0000 mg | SUBCUTANEOUS | Status: DC

## 2011-02-09 NOTE — Telephone Encounter (Signed)
done

## 2011-02-11 ENCOUNTER — Other Ambulatory Visit: Payer: Self-pay | Admitting: Oncology

## 2011-02-11 DIAGNOSIS — C9000 Multiple myeloma not having achieved remission: Secondary | ICD-10-CM

## 2011-02-12 ENCOUNTER — Ambulatory Visit: Payer: Medicare Other

## 2011-02-12 ENCOUNTER — Telehealth: Payer: Self-pay | Admitting: *Deleted

## 2011-02-12 ENCOUNTER — Other Ambulatory Visit: Payer: Medicare Other | Admitting: Lab

## 2011-02-12 ENCOUNTER — Ambulatory Visit (HOSPITAL_BASED_OUTPATIENT_CLINIC_OR_DEPARTMENT_OTHER): Payer: Medicare Other

## 2011-02-12 ENCOUNTER — Other Ambulatory Visit: Payer: Self-pay | Admitting: Oncology

## 2011-02-12 ENCOUNTER — Ambulatory Visit (HOSPITAL_BASED_OUTPATIENT_CLINIC_OR_DEPARTMENT_OTHER): Payer: Medicare Other | Admitting: Oncology

## 2011-02-12 VITALS — BP 114/75 | HR 68 | Temp 98.9°F | Ht 71.0 in | Wt 202.7 lb

## 2011-02-12 DIAGNOSIS — Z7901 Long term (current) use of anticoagulants: Secondary | ICD-10-CM

## 2011-02-12 DIAGNOSIS — Z86711 Personal history of pulmonary embolism: Secondary | ICD-10-CM

## 2011-02-12 DIAGNOSIS — C888 Other malignant immunoproliferative diseases: Secondary | ICD-10-CM

## 2011-02-12 DIAGNOSIS — C9000 Multiple myeloma not having achieved remission: Secondary | ICD-10-CM

## 2011-02-12 DIAGNOSIS — Z86718 Personal history of other venous thrombosis and embolism: Secondary | ICD-10-CM

## 2011-02-12 DIAGNOSIS — K4090 Unilateral inguinal hernia, without obstruction or gangrene, not specified as recurrent: Secondary | ICD-10-CM

## 2011-02-12 LAB — CBC & DIFF AND RETIC
BASO%: 1.5 % (ref 0.0–2.0)
HCT: 37.3 % — ABNORMAL LOW (ref 38.4–49.9)
Immature Retic Fract: 10.9 % — ABNORMAL HIGH (ref 3.00–10.60)
MCHC: 32.2 g/dL (ref 32.0–36.0)
MONO#: 0.9 10*3/uL (ref 0.1–0.9)
NEUT#: 4.5 10*3/uL (ref 1.5–6.5)
RBC: 4.26 10*6/uL (ref 4.20–5.82)
Retic %: 1.82 % — ABNORMAL HIGH (ref 0.80–1.80)
WBC: 7.8 10*3/uL (ref 4.0–10.3)
lymph#: 2.1 10*3/uL (ref 0.9–3.3)

## 2011-02-12 NOTE — Telephone Encounter (Signed)
placed patient on the walk in list for the lab on 02-12-2011 gave patient appointment for 03-15-2011 starting at 9:30am 

## 2011-02-12 NOTE — Telephone Encounter (Signed)
placed patient on the walk in list for the lab on 02-12-2011 gave patient appointment for 03-15-2011 starting at 9:30am

## 2011-02-13 ENCOUNTER — Encounter: Payer: Medicare Other | Admitting: Physical Therapy

## 2011-02-13 ENCOUNTER — Telehealth: Payer: Self-pay | Admitting: *Deleted

## 2011-02-13 NOTE — Telephone Encounter (Signed)
Notified pt that a bone marrow bx has been scheduled for him 02/14/11 at 8 am pt is to report to the lab. Flow cytometry has been notified. Spoke with Montez Morita. MD is aware that Montez Morita is on a "tight" schedule and will be here at 815

## 2011-02-13 NOTE — Progress Notes (Signed)
Referral MD Darryll Capers  [phone]    Reason for Referral: history of plasmacytoma  Chief Complaint  Patient presents with  . Multiple Myeloma  : This patient has previously been seen by dr Arline Asp.  He had been evaluated in 2010 for back pain and light chain abnormality. At that time he had a small lesion at L3 , which was felt tobe a hemangioma. He had been having back pain off and on for a number of years. Pain became worse and ultimately disabling when he was admitted to [facility] hospital. He was still able to use his legs . An MRI showed a destructive lesion through L3 causing displacement of bony fragments posteriorly. He underwent surgery for this on 01/05/11 and had  Stabilizing rods placed as well. He is in an outpatient rehab program and is doing extremely well. His pain is managed with minimal meds (robaxin, ultram and tylenol). He has seen dr Dayton Scrape who is planning to start xrt in January. In the interim he has developed an inguinal hernia requiring repair next week. He is on chronic coumadin for recurrent PE. The 2nd episode took place after a cholecystectomy where he was given lovenox for 2 weeks, and had a PE after the 3rd week, HPI:   Past Medical History  Diagnosis Date  . Pulmonary embolism X 2 , 1st after prostate surgery  . Prostate cancer 2002  . Pancreatitis Recurrent episodes prior to 2/12  . Multiple myeloma Being assessd  . Pulmonary embolism   . Degenerative disc disease   . Anemia   . Hernia current  :  Past Surgical History  Procedure Date  . Cholecystectomy 04/06/2010  . Back surgery   :  Current outpatient prescriptions:acetaminophen (TYLENOL) 500 MG tablet, Take 1-2 tablets (500-1,000 mg total) by mouth every 6 (six) hours as needed for pain. For pain, Disp: 30 tablet, Rfl: 1;  methocarbamol (ROBAXIN) 500 MG tablet, Take 500 mg by mouth 4 (four) times daily.  , Disp: , Rfl: ;  omeprazole (PRILOSEC) 20 MG capsule, Take 20 mg by mouth 2 (two) times daily.   , Disp: , Rfl:  traMADol (ULTRAM) 50 MG tablet, Take 50 mg by mouth every 6 (six) hours as needed. Maximum dose= 8 tablets per day , Disp: , Rfl: ;  warfarin (COUMADIN) 5 MG tablet, To be titrated by phramacy, goal inr 2-3, Disp: 30 tablet, Rfl: 6;  enoxaparin (LOVENOX) 80 MG/0.8ML SOLN injection, Inject 0.8 mLs (80 mg total) into the skin daily., Disp: 0.8 mL, Rfl: 9 ferrous fumarate-b12-vitamic C-folic acid (TRINSICON / FOLTRIN) capsule, Take 1 capsule by mouth 3 (three) times daily after meals., Disp: 90 capsule, Rfl: 1:    :  No Known Allergies:  Family History  Problem Relation Age of Onset  . Lymphoma Father 20  . Cancer Father   . Clotting disorder Mother at age 60     blood clots in both grandparents and 1 uncle, 3 brothers in good health  :  History   Social History  . Marital Status: Married x 3    Spouse Name: N/A    Number of Children: 3 children ( all diabetics)  . Years of Education: N/A   Occupational History  . Not on file. Works [employer], travels extensively   Social History Main Topics  . Smoking status: Former Games developer  . Smokeless tobacco: Not on file   Comment: quit 50 yrs  . Alcohol Use: No  . Drug Use: No  . Sexually  Active: Yes   Other Topics Concern  . Not on file   Social History Narrative   MarriedRegular exercise-yes  :  A comprehensive review of systems was negative except for: Musculoskeletal: positive for back pain  Exam: @IPVITALS @ General appearance: alert, cooperative and appears stated age, back brace in place HEENT: no thrush, no adenopathy, anicteric, no thyromegaly Nodes- normal Lungs: normal CVS: normal Abdomen: normal Lt inguinal hernia Ext: normal   Basename 02/12/11 1318  WBC 7.8  HGB 12.0*  HCT 37.3*  PLT 428*    Basename 02/12/11 1318  NA 138  K 4.1  CL 107  CO2 20  GLUCOSE 85  BUN 11  CREATININE 0.79  CALCIUM 9.0    Blood smear review: n/a  Pathology:plasma cells in vertebral lesions.  No  results found.  Assessment and Plan: pleasant man with hx of plasmacytoma at L3 and likely sacrum. He has a history of light chain abnormality. Recent labs from hospital did not show an m-spike, but did reveal light chains. He will undergo a 24 hr urine collection as well as a bone marrow biopsy before deciding if systemic therapy will be required. In the interim he will undergo hernia repain and xrt. He should also be receiving zometa monthly.

## 2011-02-14 ENCOUNTER — Encounter: Payer: Medicare Other | Admitting: Oncology

## 2011-02-14 ENCOUNTER — Ambulatory Visit (HOSPITAL_BASED_OUTPATIENT_CLINIC_OR_DEPARTMENT_OTHER): Payer: Medicare Other | Admitting: Oncology

## 2011-02-14 ENCOUNTER — Other Ambulatory Visit (HOSPITAL_COMMUNITY)
Admission: RE | Admit: 2011-02-14 | Discharge: 2011-02-14 | Disposition: A | Discharge status: Home / Self Care | Payer: Medicare Other | Source: Ambulatory Visit | Attending: Oncology | Admitting: Oncology

## 2011-02-14 ENCOUNTER — Other Ambulatory Visit (HOSPITAL_BASED_OUTPATIENT_CLINIC_OR_DEPARTMENT_OTHER): Payer: Medicare Other | Admitting: Lab

## 2011-02-14 ENCOUNTER — Ambulatory Visit (INDEPENDENT_AMBULATORY_CARE_PROVIDER_SITE_OTHER): Payer: Self-pay | Admitting: Surgery

## 2011-02-14 ENCOUNTER — Other Ambulatory Visit: Payer: Self-pay | Admitting: Oncology

## 2011-02-14 ENCOUNTER — Telehealth: Payer: Self-pay | Admitting: Internal Medicine

## 2011-02-14 ENCOUNTER — Encounter (HOSPITAL_COMMUNITY): Payer: Self-pay | Admitting: Pharmacy Technician

## 2011-02-14 DIAGNOSIS — C9000 Multiple myeloma not having achieved remission: Secondary | ICD-10-CM | POA: Insufficient documentation

## 2011-02-14 DIAGNOSIS — D472 Monoclonal gammopathy: Secondary | ICD-10-CM

## 2011-02-14 DIAGNOSIS — C801 Malignant (primary) neoplasm, unspecified: Secondary | ICD-10-CM

## 2011-02-14 DIAGNOSIS — Z86718 Personal history of other venous thrombosis and embolism: Secondary | ICD-10-CM

## 2011-02-14 LAB — CBC WITH DIFFERENTIAL/PLATELET
BASO%: 0.9 % (ref 0.0–2.0)
LYMPH%: 18.8 % (ref 14.0–49.0)
MCH: 28.7 pg (ref 27.2–33.4)
MCHC: 32.9 g/dL (ref 32.0–36.0)
MCV: 87.3 fL (ref 79.3–98.0)
MONO%: 10.7 % (ref 0.0–14.0)
Platelets: 374 10*3/uL (ref 140–400)
RBC: 4.14 10*6/uL — ABNORMAL LOW (ref 4.20–5.82)

## 2011-02-14 LAB — IGG, IGA, IGM
IgA: 469 mg/dL — ABNORMAL HIGH (ref 68–379)
IgG (Immunoglobin G), Serum: 1030 mg/dL (ref 650–1600)

## 2011-02-14 LAB — COMPREHENSIVE METABOLIC PANEL
Albumin: 3.8 g/dL (ref 3.5–5.2)
Alkaline Phosphatase: 136 U/L — ABNORMAL HIGH (ref 39–117)
BUN: 11 mg/dL (ref 6–23)
Calcium: 9 mg/dL (ref 8.4–10.5)
Chloride: 107 mEq/L (ref 96–112)
Glucose, Bld: 85 mg/dL (ref 70–99)
Potassium: 4.1 mEq/L (ref 3.5–5.3)
Sodium: 138 mEq/L (ref 135–145)
Total Protein: 6.6 g/dL (ref 6.0–8.3)

## 2011-02-14 LAB — PROTEIN ELECTROPHORESIS, SERUM, WITH REFLEX
Alpha-1-Globulin: 5.6 % — ABNORMAL HIGH (ref 2.9–4.9)
Alpha-2-Globulin: 12.4 % — ABNORMAL HIGH (ref 7.1–11.8)
Beta Globulin: 6.7 % (ref 4.7–7.2)
Gamma Globulin: 14.9 % (ref 11.1–18.8)

## 2011-02-14 LAB — BETA 2 MICROGLOBULIN, SERUM: Beta-2 Microglobulin: 1.69 mg/L (ref 1.01–1.73)

## 2011-02-14 LAB — PROTIME-INR: Protime: 37.2 Seconds — ABNORMAL HIGH (ref 10.6–13.4)

## 2011-02-14 LAB — KAPPA/LAMBDA LIGHT CHAINS: Kappa:Lambda Ratio: 0.05 — ABNORMAL LOW (ref 0.26–1.65)

## 2011-02-14 NOTE — Progress Notes (Signed)
Bone Marrow Biopsy and Aspiration Procedure Note   Informed consent was obtained and potential risks including bleeding, infection and pain were reviewed with the patient. A time out procedure was held.  Left Posterior iliac crest(s) prepped with Betadine.   Lidocaine 2% local anesthesia infiltrated into the subcutaneous tissue.  Left bone marrow biopsy and left bone marrow aspirate was obtained.   The procedure was tolerated well and there were no complications.  Specimens sent for: routine histopathologic stains and sectioning, flow cytometry and cytogenetics. The patient tolerated the procedure well and  was discharged from the outpatient clinic in stable condition   Pierce Crane, MD [facility] [phone] (beeper) [phone] (Office)  02/14/2011, 9:28 AM

## 2011-02-14 NOTE — Telephone Encounter (Signed)
No coumadin tonight-Left message on machine for pt

## 2011-02-14 NOTE — Progress Notes (Signed)
0905--dressing assessed, dressing CDI, vitals stable, pt left with wife.  SLJ This encounter was created in error - please disregard.

## 2011-02-14 NOTE — Telephone Encounter (Signed)
On blood thinners. Had a procedure today and his levels were 3.1 today. Should he continue tonight?

## 2011-02-14 NOTE — Telephone Encounter (Signed)
Hold coumadin

## 2011-02-15 ENCOUNTER — Telehealth (INDEPENDENT_AMBULATORY_CARE_PROVIDER_SITE_OTHER): Payer: Self-pay

## 2011-02-15 NOTE — Telephone Encounter (Signed)
Per Dr. Gerrit Friends: Please make sure that Dr. Darryll Capers knows that Rodney [name] DOB 1940/03/22 is scheduled for hernia repair on Tuesday, Dec 18th.  Make sure they will be managing his anticoagulation.  Also request that a PT level be obtained early on the morning of surgery and run STAT.  Above message given to Dr. Amador Cunas due to time limitation.  Dr. Lovell Sheehan and his nurse will return to the office on Monday Dec. 17 th.  Thank you Charise Carwin MA

## 2011-02-16 ENCOUNTER — Encounter (HOSPITAL_COMMUNITY)
Admission: RE | Admit: 2011-02-16 | Discharge: 2011-02-16 | Disposition: A | Discharge status: Home / Self Care | Payer: Medicare Other | Source: Ambulatory Visit | Attending: Surgery | Admitting: Surgery

## 2011-02-16 ENCOUNTER — Encounter (HOSPITAL_COMMUNITY): Payer: Self-pay

## 2011-02-16 DIAGNOSIS — T8859XA Other complications of anesthesia, initial encounter: Secondary | ICD-10-CM

## 2011-02-16 DIAGNOSIS — D649 Anemia, unspecified: Secondary | ICD-10-CM

## 2011-02-16 DIAGNOSIS — IMO0002 Reserved for concepts with insufficient information to code with codable children: Secondary | ICD-10-CM

## 2011-02-16 DIAGNOSIS — R0602 Shortness of breath: Secondary | ICD-10-CM

## 2011-02-16 DIAGNOSIS — K469 Unspecified abdominal hernia without obstruction or gangrene: Secondary | ICD-10-CM

## 2011-02-16 HISTORY — DX: Reserved for concepts with insufficient information to code with codable children: IMO0002

## 2011-02-16 HISTORY — DX: Unspecified abdominal hernia without obstruction or gangrene: K46.9

## 2011-02-16 HISTORY — DX: Shortness of breath: R06.02

## 2011-02-16 HISTORY — DX: Anemia, unspecified: D64.9

## 2011-02-16 HISTORY — DX: Other complications of anesthesia, initial encounter: T88.59XA

## 2011-02-16 HISTORY — PX: LUMBAR DISC SURGERY: SHX700

## 2011-02-16 HISTORY — DX: Adverse effect of unspecified anesthetic, initial encounter: T41.45XA

## 2011-02-16 LAB — URINALYSIS, ROUTINE W REFLEX MICROSCOPIC
Bilirubin Urine: NEGATIVE
Hgb urine dipstick: NEGATIVE
Nitrite: POSITIVE — AB
Protein, ur: 30 mg/dL — AB
Urobilinogen, UA: 0.2 mg/dL (ref 0.0–1.0)

## 2011-02-16 LAB — CBC
Hemoglobin: 12.4 g/dL — ABNORMAL LOW (ref 13.0–17.0)
MCH: 28.1 pg (ref 26.0–34.0)
Platelets: 374 10*3/uL (ref 150–400)
RBC: 4.41 MIL/uL (ref 4.22–5.81)

## 2011-02-16 LAB — BASIC METABOLIC PANEL
Calcium: 9.9 mg/dL (ref 8.4–10.5)
GFR calc Af Amer: >90 mL/min (ref >90)
GFR calc non Af Amer: 90 mL/min — ABNORMAL LOW (ref >90)
Glucose, Bld: 91 mg/dL (ref 70–99)
Potassium: 4.1 mEq/L (ref 3.5–5.1)
Sodium: 138 mEq/L (ref 135–145)

## 2011-02-16 LAB — URINE MICROSCOPIC-ADD ON

## 2011-02-16 LAB — PROTIME-INR
INR: 1.78 — ABNORMAL HIGH (ref 0.00–1.49)
Prothrombin Time: 21 seconds — ABNORMAL HIGH (ref 11.6–15.2)

## 2011-02-16 NOTE — Pre-Procedure Instructions (Signed)
02-16-11 EKG 07-27-10/ CXR 01-12-11 reports with chart. Requested Sleep Study report from [facility] today.(report not yet available).

## 2011-02-16 NOTE — Telephone Encounter (Signed)
Call patient and informed him not to use low molecular weight heparin shot on the evening before or  the day of surgery

## 2011-02-16 NOTE — Progress Notes (Signed)
Faxed to [facility] 

## 2011-02-16 NOTE — Telephone Encounter (Signed)
LMTCB

## 2011-02-16 NOTE — Telephone Encounter (Signed)
Spoke with pt per Dr. Vernon Prey instruction - to f/u on coumadin and lovenox injections - per pt he has stopped coumadin and is to start injection bid until day of surgery (tuesday) . They already have lovenox to start.

## 2011-02-16 NOTE — Patient Instructions (Signed)
22 Rodney [name]  02/16/2011   Your procedure is scheduled on: 02-20-11  Report to [facility] at 0900 AM.  Call this number if you have problems the morning of surgery: [phone]   Remember:   Do not eat food:After Midnight.  May have clear liquids:until Midnight .  Clear liquids include soda, tea, black coffee, apple or grape juice, broth.  Take these medicines the morning of surgery with A SIP OF WATER: Prilosec, Ultram, Tylenol   Do not wear jewelry, make-up or nail polish.  Do not wear lotions, powders, or perfumes. You may wear deodorant.  Do not shave 48 hours prior to surgery.  Do not bring valuables to the hospital.  Contacts, dentures or bridgework may not be worn into surgery.  Leave suitcase in the car. After surgery it may be brought to your room.  For patients admitted to the hospital, checkout time is 11:00 AM the day of discharge.   Patients discharged the day of surgery will not be allowed to drive home.  Name and phone number of your driver:Margaret Esquivias [phone]  Special Instructions: CHG Shower Use Special Wash: 1/2 bottle night before surgery and 1/2 bottle morning of surgery.   Please read over the following fact sheets that you were given: MRSA Information

## 2011-02-16 NOTE — Progress Notes (Signed)
Quick Note:  These results are acceptable for scheduled surgery. TMG ______ 

## 2011-02-16 NOTE — Telephone Encounter (Signed)
Robbin: Make sure he knows NOT to use Lovenox the night before surgery or the morning of surgery. Thanks, tmg

## 2011-02-19 ENCOUNTER — Telehealth: Payer: Self-pay | Admitting: Internal Medicine

## 2011-02-19 ENCOUNTER — Other Ambulatory Visit (INDEPENDENT_AMBULATORY_CARE_PROVIDER_SITE_OTHER): Payer: Self-pay | Admitting: Surgery

## 2011-02-19 ENCOUNTER — Telehealth (INDEPENDENT_AMBULATORY_CARE_PROVIDER_SITE_OTHER): Payer: Self-pay

## 2011-02-19 ENCOUNTER — Encounter: Payer: Self-pay | Admitting: Oncology

## 2011-02-19 DIAGNOSIS — K4090 Unilateral inguinal hernia, without obstruction or gangrene, not specified as recurrent: Secondary | ICD-10-CM

## 2011-02-19 MED ORDER — AZITHROMYCIN 250 MG PO TABS: ORAL_TABLET | ORAL | Status: AC

## 2011-02-19 NOTE — Telephone Encounter (Signed)
Wants Bonnye to call him. Has surgery tomorrow @ 6am and is having congestion.

## 2011-02-19 NOTE — Telephone Encounter (Signed)
done

## 2011-02-19 NOTE — Pre-Procedure Instructions (Signed)
02-19-11 0840 Pt. Made aware of surgery time change to 0900AM and to arrive at 0645AM to Short Stay. Protime will be checked on arrival to Short Stay.-Spoke with Spouse.

## 2011-02-19 NOTE — Telephone Encounter (Signed)
Per dr jenkins- may have a zpack 

## 2011-02-19 NOTE — Telephone Encounter (Signed)
Rx sent to pharmacy. Pt aware.  

## 2011-02-19 NOTE — Telephone Encounter (Signed)
Mr. Baade has been informed not to take PM dose of Lovenox on 12/17, or AM dose on 12/18.  PT level will be checked at the hospital on arrival the day of surgery 02/20/2011.

## 2011-02-19 NOTE — Pre-Procedure Instructions (Signed)
02-16-11 late entry EKG(07-28-10) Echo (07-25-10), CXR ( 01-04-11) reports with chart.

## 2011-02-20 ENCOUNTER — Telehealth: Payer: Self-pay | Admitting: Internal Medicine

## 2011-02-20 ENCOUNTER — Ambulatory Visit (HOSPITAL_COMMUNITY): Payer: Medicare Other | Admitting: Registered Nurse

## 2011-02-20 ENCOUNTER — Other Ambulatory Visit: Payer: Medicare Other

## 2011-02-20 ENCOUNTER — Encounter (HOSPITAL_COMMUNITY): Payer: Self-pay | Admitting: Registered Nurse

## 2011-02-20 ENCOUNTER — Ambulatory Visit (HOSPITAL_COMMUNITY)
Admission: RE | Admit: 2011-02-20 | Discharge: 2011-02-20 | Disposition: A | Discharge status: Home / Self Care | Payer: Medicare Other | Source: Ambulatory Visit | Attending: Surgery | Admitting: Surgery

## 2011-02-20 ENCOUNTER — Encounter (HOSPITAL_COMMUNITY)
Admission: RE | Disposition: A | Discharge status: Home / Self Care | Payer: Self-pay | Source: Ambulatory Visit | Attending: Surgery

## 2011-02-20 ENCOUNTER — Encounter (HOSPITAL_COMMUNITY): Payer: Self-pay | Admitting: *Deleted

## 2011-02-20 ENCOUNTER — Other Ambulatory Visit: Payer: Self-pay | Admitting: *Deleted

## 2011-02-20 DIAGNOSIS — K4090 Unilateral inguinal hernia, without obstruction or gangrene, not specified as recurrent: Secondary | ICD-10-CM

## 2011-02-20 DIAGNOSIS — Z86711 Personal history of pulmonary embolism: Secondary | ICD-10-CM | POA: Insufficient documentation

## 2011-02-20 DIAGNOSIS — Z7901 Long term (current) use of anticoagulants: Secondary | ICD-10-CM | POA: Insufficient documentation

## 2011-02-20 DIAGNOSIS — Z01812 Encounter for preprocedural laboratory examination: Secondary | ICD-10-CM | POA: Insufficient documentation

## 2011-02-20 DIAGNOSIS — D176 Benign lipomatous neoplasm of spermatic cord: Secondary | ICD-10-CM | POA: Insufficient documentation

## 2011-02-20 DIAGNOSIS — Z87898 Personal history of other specified conditions: Secondary | ICD-10-CM | POA: Insufficient documentation

## 2011-02-20 DIAGNOSIS — Z8546 Personal history of malignant neoplasm of prostate: Secondary | ICD-10-CM | POA: Insufficient documentation

## 2011-02-20 HISTORY — PX: INGUINAL HERNIA REPAIR: SHX194

## 2011-02-20 LAB — PROTIME-INR: INR: 1 (ref 0.00–1.49)

## 2011-02-20 SURGERY — REPAIR, HERNIA, INGUINAL, ADULT
Anesthesia: General | Site: Groin | Laterality: Left | Wound class: Clean

## 2011-02-20 MED ORDER — NEOSTIGMINE METHYLSULFATE 1 MG/ML IJ SOLN
INTRAMUSCULAR | Status: DC | PRN
  Administered 2011-02-20: 4 mg via INTRAVENOUS

## 2011-02-20 MED ORDER — CEFAZOLIN SODIUM-DEXTROSE 2-3 GM-% IV SOLR: 2.0000 g | Freq: Once | INTRAVENOUS | Status: DC

## 2011-02-20 MED ORDER — GLYCOPYRROLATE 0.2 MG/ML IJ SOLN
INTRAMUSCULAR | Status: DC | PRN
  Administered 2011-02-20: .6 mg via INTRAVENOUS

## 2011-02-20 MED ORDER — FENTANYL CITRATE 0.05 MG/ML IJ SOLN: 25.0000 ug | INTRAMUSCULAR | Status: DC | PRN

## 2011-02-20 MED ORDER — DEXAMETHASONE SODIUM PHOSPHATE 10 MG/ML IJ SOLN
INTRAMUSCULAR | Status: DC | PRN
  Administered 2011-02-20: 10 mg via INTRAVENOUS

## 2011-02-20 MED ORDER — HYDROCODONE-ACETAMINOPHEN 5-325 MG PO TABS
1.0000 | ORAL_TABLET | ORAL | Status: DC | PRN
  Administered 2011-02-20: 1 via ORAL

## 2011-02-20 MED ORDER — ENOXAPARIN SODIUM 80 MG/0.8ML ~~LOC~~ SOLN: 80.0000 mg | SUBCUTANEOUS | Status: DC

## 2011-02-20 MED ORDER — ROCURONIUM BROMIDE 100 MG/10ML IV SOLN
INTRAVENOUS | Status: DC | PRN
  Administered 2011-02-20: 30 mg via INTRAVENOUS
  Administered 2011-02-20: 10 mg via INTRAVENOUS

## 2011-02-20 MED ORDER — BUPIVACAINE LIPOSOME 1.3 % IJ SUSP
20.0000 mL | Freq: Once | INTRAMUSCULAR | Status: AC
  Administered 2011-02-20: 20 mL
  Filled 2011-02-20: qty 20

## 2011-02-20 MED ORDER — PROPOFOL 10 MG/ML IV BOLUS
INTRAVENOUS | Status: DC | PRN
  Administered 2011-02-20: 160 mg via INTRAVENOUS

## 2011-02-20 MED ORDER — HYDROCODONE-ACETAMINOPHEN 5-325 MG PO TABS: 1.0000 | ORAL_TABLET | ORAL | Status: AC | PRN

## 2011-02-20 MED ORDER — FENTANYL CITRATE 0.05 MG/ML IJ SOLN
INTRAMUSCULAR | Status: DC | PRN
  Administered 2011-02-20 (×2): 50 ug via INTRAVENOUS
  Administered 2011-02-20: 100 ug via INTRAVENOUS
  Administered 2011-02-20: 50 ug via INTRAVENOUS

## 2011-02-20 MED ORDER — LACTATED RINGERS IV SOLN: INTRAVENOUS | Status: DC

## 2011-02-20 MED ORDER — ONDANSETRON HCL 4 MG/2ML IJ SOLN
INTRAMUSCULAR | Status: DC | PRN
  Administered 2011-02-20: 4 mg via INTRAVENOUS

## 2011-02-20 MED ORDER — PROMETHAZINE HCL 25 MG/ML IJ SOLN: 6.2500 mg | INTRAMUSCULAR | Status: DC | PRN

## 2011-02-20 MED ORDER — LACTATED RINGERS IV SOLN
INTRAVENOUS | Status: DC | PRN
  Administered 2011-02-20 (×2): via INTRAVENOUS

## 2011-02-20 MED ORDER — SUCCINYLCHOLINE CHLORIDE 20 MG/ML IJ SOLN
INTRAMUSCULAR | Status: DC | PRN
  Administered 2011-02-20: 100 mg via INTRAVENOUS

## 2011-02-20 MED ORDER — MIDAZOLAM HCL 5 MG/5ML IJ SOLN
INTRAMUSCULAR | Status: DC | PRN
  Administered 2011-02-20: 2 mg via INTRAVENOUS

## 2011-02-20 MED ORDER — LIDOCAINE HCL (CARDIAC) 20 MG/ML IV SOLN
INTRAVENOUS | Status: DC | PRN
  Administered 2011-02-20: 50 mg via INTRAVENOUS

## 2011-02-20 MED ORDER — 0.9 % SODIUM CHLORIDE (POUR BTL) OPTIME
TOPICAL | Status: DC | PRN
  Administered 2011-02-20: 1000 mL

## 2011-02-20 MED ORDER — HYDROCODONE-ACETAMINOPHEN 5-325 MG PO TABS
ORAL_TABLET | ORAL | Status: AC
  Filled 2011-02-20: qty 1

## 2011-02-20 MED ORDER — CEFAZOLIN SODIUM 1-5 GM-% IV SOLN
INTRAVENOUS | Status: AC
  Filled 2011-02-20: qty 100

## 2011-02-20 MED ORDER — CEFAZOLIN SODIUM 1-5 GM-% IV SOLN
INTRAVENOUS | Status: DC | PRN
  Administered 2011-02-20: 2 g via INTRAVENOUS

## 2011-02-20 SURGICAL SUPPLY — 38 items
APPLICATOR COTTON TIP 6IN STRL (MISCELLANEOUS) ×2 IMPLANT
BENZOIN TINCTURE PRP APPL 2/3 (GAUZE/BANDAGES/DRESSINGS) ×2 IMPLANT
BLADE HEX COATED 2.75 (ELECTRODE) ×2 IMPLANT
BLADE SURG 15 STRL LF DISP TIS (BLADE) ×1 IMPLANT
BLADE SURG 15 STRL SS (BLADE) ×1
BLADE SURG SZ10 CARB STEEL (BLADE) IMPLANT
CANISTER SUCTION 2500CC (MISCELLANEOUS) ×2 IMPLANT
CLOTH BEACON ORANGE TIMEOUT ST (SAFETY) ×2 IMPLANT
DECANTER SPIKE VIAL GLASS SM (MISCELLANEOUS) ×2 IMPLANT
DRAIN PENROSE 18X1/2 LTX STRL (DRAIN) ×2 IMPLANT
DRAPE LAPAROTOMY TRNSV 102X78 (DRAPE) ×2 IMPLANT
ELECT REM PT RETURN 9FT ADLT (ELECTROSURGICAL) ×2
ELECTRODE REM PT RTRN 9FT ADLT (ELECTROSURGICAL) ×1 IMPLANT
GLOVE BIOGEL PI IND STRL 7.0 (GLOVE) ×1 IMPLANT
GLOVE BIOGEL PI INDICATOR 7.0 (GLOVE) ×1
GLOVE SURG ORTHO 8.0 STRL STRW (GLOVE) ×2 IMPLANT
GLOVE SURG SS PI 7.0 STRL IVOR (GLOVE) ×4 IMPLANT
GOWN STRL NON-REIN LRG LVL3 (GOWN DISPOSABLE) ×2 IMPLANT
GOWN STRL REIN XL XLG (GOWN DISPOSABLE) ×4 IMPLANT
KIT BASIN OR (CUSTOM PROCEDURE TRAY) ×2 IMPLANT
MESH ULTRAPRO 3X6 7.6X15CM (Mesh General) ×2 IMPLANT
NEEDLE HYPO 25X1 1.5 SAFETY (NEEDLE) ×2 IMPLANT
NS IRRIG 1000ML POUR BTL (IV SOLUTION) ×2 IMPLANT
PACK BASIC VI WITH GOWN DISP (CUSTOM PROCEDURE TRAY) ×2 IMPLANT
PENCIL BUTTON HOLSTER BLD 10FT (ELECTRODE) ×2 IMPLANT
SPONGE GAUZE 4X4 12PLY (GAUZE/BANDAGES/DRESSINGS) ×2 IMPLANT
SPONGE LAP 4X18 X RAY DECT (DISPOSABLE) ×6 IMPLANT
STRIP CLOSURE SKIN 1/2X4 (GAUZE/BANDAGES/DRESSINGS) ×2 IMPLANT
SUT MNCRL AB 4-0 PS2 18 (SUTURE) ×2 IMPLANT
SUT NOVA NAB GS-22 2 0 T19 (SUTURE) ×4 IMPLANT
SUT SILK 2 0 SH (SUTURE) ×2 IMPLANT
SUT VIC AB 3-0 SH 18 (SUTURE) ×2 IMPLANT
SUT VIC AB 4-0 PS2 27 (SUTURE) IMPLANT
SYR BULB IRRIGATION 50ML (SYRINGE) ×2 IMPLANT
SYR CONTROL 10ML LL (SYRINGE) ×2 IMPLANT
TAPE CLOTH SURG 4X10 WHT LF (GAUZE/BANDAGES/DRESSINGS) ×2 IMPLANT
TOWEL OR 17X26 10 PK STRL BLUE (TOWEL DISPOSABLE) ×2 IMPLANT
YANKAUER SUCT BULB TIP 10FT TU (MISCELLANEOUS) ×2 IMPLANT

## 2011-02-20 NOTE — Telephone Encounter (Signed)
Has appointment scheduled for 1-2-wife informed

## 2011-02-20 NOTE — Telephone Encounter (Signed)
Pt would like to know when he needs to follow up with doctor. Please advise

## 2011-02-20 NOTE — Anesthesia Preprocedure Evaluation (Addendum)
Anesthesia Evaluation  Patient identified by MRN, date of birth, ID band Patient awake    Reviewed: Allergy & Precautions, H&P , NPO status , Patient's Chart, lab work & pertinent test results  Airway Mallampati: II TM Distance: >3 FB Neck ROM: full    Dental  (+) Teeth Intact, Caps and Dental Advisory Given,    Pulmonary shortness of breath and with exertion,  Hx. PE times 2.  First was 10 years ago and the second was 10 months ago.  On Coumadin. clear to auscultation  Pulmonary exam normal       Cardiovascular Exercise Tolerance: Good neg cardio ROS regular Normal    Neuro/Psych Negative Neurological ROS  Negative Psych ROS   GI/Hepatic negative GI ROS, Neg liver ROS, PUD, GERD-  Medicated and Controlled,  Endo/Other  Negative Endocrine ROS  Renal/GU negative Renal ROS  Genitourinary negative   Musculoskeletal   Abdominal   Peds  Hematology negative hematology ROS (+)   Anesthesia Other Findings Has some kind of cancer for which he will get chemo.  Has mets in his spine which has caused major problems.  Reproductive/Obstetrics negative OB ROS                         Anesthesia Physical Anesthesia Plan  ASA: III  Anesthesia Plan: General   Post-op Pain Management:    Induction: Intravenous  Airway Management Planned: Oral ETT  Additional Equipment:   Intra-op Plan:   Post-operative Plan: Extubation in OR  Informed Consent: I have reviewed the patients History and Physical, chart, labs and discussed the procedure including the risks, benefits and alternatives for the proposed anesthesia with the patient or authorized representative who has indicated his/her understanding and acceptance.   Dental Advisory Given  Plan Discussed with: CRNA and Surgeon  Anesthesia Plan Comments:       Anesthesia Quick Evaluation

## 2011-02-20 NOTE — Interval H&P Note (Signed)
History and Physical Interval Note:  02/20/2011 8:53 AM  Rodney [name]  has presented today for surgery, with the diagnosis of left inguinal hernia.  The various methods of treatment have been discussed with the patient and family. After consideration of risks, benefits and other options for treatment, the patient has consented to  Procedure(s):  HERNIA REPAIR INGUINAL ADULT as a surgical intervention.  The patients' history has been reviewed, patient examined, no change in status, stable for surgery.  I have reviewed the patients' chart and labs.  Questions were answered to the patient's satisfaction.    Velora Heckler, MD, FACS General & Endocrine Surgery [facility]  Terrie Haring Judie Petit

## 2011-02-20 NOTE — H&P (Signed)
Rodney [name] is an 70 y.o. male.   Chief Complaint: LIH HPI: Pt presents for repair of LIH.  Anticoagulation stopped.  PT normal this AM.  Past Medical History  Diagnosis Date  . Pulmonary embolism   . Pancreatitis   . Pulmonary embolism   . Complication of anesthesia 02-16-11    Pt. speaks of awakening during the surgery from back  surgery  . Shortness of breath 02-16-11    hx. Pulmonary emboli x2 (9 yrs/ 3'12 -last)  . Anemia 02-16-11    01-05-11- post surgery-Transfusions x 4 units  . Prostate cancer   . Multiple myeloma 02-16-11    suspected tumor  left sacral  . Degenerative disc disease 02-16-11    01-05-11 L3 fusion done  . Hernia 02-16-11    left inguinal hernia at present    Past Surgical History  Procedure Date  . Back surgery 02-16-11     01-05-11 Lumbar surgery L3(complicated by loss of blood volume)/ 01-09-11 then Lumbar fusion done with retained  hardware   . Cholecystectomy 04/06/2010    laparoscopic-inflammation with stones    Family History  Problem Relation Age of Onset  . Lymphoma Father 44  . Cancer Father   . Clotting disorder Mother     blood clots   Social History:  reports that he quit smoking about 42 years ago. He does not have any smokeless tobacco history on file. He reports that he does not drink alcohol or use illicit drugs.  Allergies: No Known Allergies  Medications Prior to Admission  Medication Dose Route Frequency Provider Last Rate Last Dose  . bupivacaine liposome (EXPAREL) 1.3 % injection 266 mg  20 mL Infiltration Once Velora Heckler, MD      . ceFAZolin (ANCEF) IVPB 2 g/50 mL premix  2 g Intravenous Once Velora Heckler, MD       Medications Prior to Admission  Medication Sig Dispense Refill  . acetaminophen (TYLENOL) 500 MG tablet Take 1-2 tablets (500-1,000 mg total) by mouth every 6 (six) hours as needed for pain. For pain  30 tablet  1  . omeprazole (PRILOSEC) 20 MG capsule Take 20 mg by mouth 2 (two) times daily as needed.  HEART BURN       . warfarin (COUMADIN) 5 MG tablet Take 2.5-5 mg by mouth every evening. 5 MG FOR 2 DAYS THEN 2.5 MG FOR 1 DAY. THEN BACK TO 5 MG FOR 2 DAYS         Results for orders placed during the hospital encounter of 02/20/11 (from the past 48 hour(s))  PROTIME-INR     Status: Normal   Collection Time   02/20/11  7:35 AM      Component Value Range Comment   Prothrombin Time 13.4  11.6 - 15.2 (seconds)    INR 1.00  0.00 - 1.49     No results found.  Review of Systems  Constitutional: Negative.   HENT: Negative.   Eyes: Negative.   Respiratory: Negative.   Cardiovascular: Negative.   Gastrointestinal: Negative.   Genitourinary: Negative.   Musculoskeletal: Negative.   Skin: Negative.   Neurological: Negative.   Endo/Heme/Allergies: Bruises/bleeds easily.  Psychiatric/Behavioral: Negative.     Blood pressure 108/71, pulse 65, temperature 97.3 F (36.3 C), temperature source Oral, resp. rate 18, SpO2 99.00%. Physical Exam  Constitutional: He appears well-developed and well-nourished.  HENT:  Head: Normocephalic and atraumatic.  Mouth/Throat: Oropharynx is clear and moist.  Eyes: Conjunctivae and EOM are  normal. Pupils are equal, round, and reactive to light.  Neck: Normal range of motion. Neck supple.  Cardiovascular: Normal rate, regular rhythm, normal heart sounds and intact distal pulses.   Respiratory: Effort normal and breath sounds normal.  GI: Soft. Bowel sounds are normal.  Genitourinary:        Assessment/Plan LIH, reducible Plan repair in OR today.  Velora Heckler, MD, FACS General & Endocrine Surgery [facility]   Navada Osterhout M 02/20/2011, 8:48 AM

## 2011-02-20 NOTE — Transfer of Care (Signed)
Immediate Anesthesia Transfer of Care Note  Patient: Rodney [name]  Procedure(s) Performed:  HERNIA REPAIR INGUINAL ADULT - Repair Left Inguinal Hernia with Mesh  Patient Location: PACU  Anesthesia Type: General  Level of Consciousness: sedated  Airway & Oxygen Therapy: Patient Spontanous Breathing and Patient connected to face mask oxygen  Post-op Assessment: Report given to PACU RN and Post -op Vital signs reviewed and stable  Post vital signs: Reviewed and stable  Complications: No apparent anesthesia complications

## 2011-02-20 NOTE — Anesthesia Postprocedure Evaluation (Signed)
  Anesthesia Post-op Note  Patient: Rodney [name]  Procedure(s) Performed:  HERNIA REPAIR INGUINAL ADULT - Repair Left Inguinal Hernia with Mesh  Patient Location: PACU  Anesthesia Type: General  Level of Consciousness: awake and alert   Airway and Oxygen Therapy: Patient Spontanous Breathing  Post-op Pain: mild  Post-op Assessment: Post-op Vital signs reviewed, Patient's Cardiovascular Status Stable, Respiratory Function Stable, Patent Airway and No signs of Nausea or vomiting  Post-op Vital Signs: stable  Complications: No apparent anesthesia complications

## 2011-02-20 NOTE — Op Note (Signed)
Inguinal Hernia, Open, Procedure Note  Pre-operative Diagnosis:  LIH, reducible   Post-operative Diagnosis: same  Surgeon:  Velora Heckler, MD, FACS  Anesthesia:  General  Indications: The patient presented with a left, reducible hernia.    Procedure Details  The patient was seen again in the Holding Room. The risks, benefits, complications, treatment options, and expected outcomes were discussed with the patient.  There was concurrence with the proposed plan, and informed consent was obtained. The site of surgery was properly noted/marked. The patient was taken to the Operating Room, identified by name, and the procedure verified as hernia repair. A Time Out was held and the above information confirmed.  The patient was placed in the supine position and underwent induction of anesthesia.  The lower abdomen and groin was prepped and draped in the usual strict aseptic fashion.  After ascertaining that an adequate level of anesthesia had been obtained, and incision is made in the groin with a #10 blade.  Dissection is carried through the subcutaneous tissues and hemostasis obtained with the electrocautery.  A Gelpi retractor is placed for exposure.  The external oblique fascia is incised in line with it's fibers and extended through the external inguinal ring.  The cord structures are dissected out of the inguinal canal and encircled with a Penrose drain.  The floor of the inguinal canal is dissected out.  The cord is explored.  A large indirect inguinal hernia sac is identified and dissected out to the level of the internal inguinal ring.  A high ligation of the sac is performed with a 2-0 silk suture ligature and the sac excised and discarded.  A small lipoma of the cord is dissected out to the level of the internal ring and suture ligated with a 3-0 vicryl suture and excised and discarded.  The floor of the inguinal canal is reconstructed with a sheet of mesh cut to the appropriate dimensions.  It  is secured to the pubic tubercle with a 2-0 Novafil suture and along the inguinal ligament with a running 2-0 Novafil suture.  Mesh is split to accommodate the cord structures.  The superior edge of the mesh is secured to the transversalis and internal oblique muscles with interrupted 2-0 Novafil sutures.  The tails of the mesh are overlapped lateral to the cord structures and secured to the inguinal ligament with interrupted 2-0 Novafil sutures to recreate the internal inguinal ring.  Cord structures are returned to the inguinal canal.  Local anesthetic is infiltrated throughout the field.  External oblique fascia is closed with interrupted 3-0 Vicryl sutures.  Subcutaneous tissues are closed with interrupted 3-0 Vicryl sutures.  Skin is anesthetized with local anesthetic, and the skin edges re-approximated with a running 4-0 Monocryl suture.  Wound is washed and dried and benzoin and steristrips are applied.  A gauze dressing is then applied.  Instrument, sponge, and needle counts were correct prior to closure and at the conclusion of the case.  Velora Heckler, MD, FACS General & Endocrine Surgery [facility]   Findings: Hernia as above  Estimated Blood Loss: Minimal         Specimens: None  Complications: None; patient tolerated the procedure well.         Disposition: PACU - hemodynamically stable.         Condition: stable

## 2011-02-20 NOTE — Anesthesia Procedure Notes (Signed)
Procedure Name: Intubation Date/Time: 02/20/2011 9:00 AM Performed by: Joycie Peek Pre-anesthesia Checklist: Patient identified, Emergency Drugs available, Suction available, Patient being monitored and Timeout performed Patient Re-evaluated:Patient Re-evaluated prior to inductionOxygen Delivery Method: Circle System Utilized Preoxygenation: Pre-oxygenation with 100% oxygen Intubation Type: IV induction Ventilation: Mask ventilation without difficulty Laryngoscope Size: Mac and 4 Grade View: Grade I Tube type: Oral Tube size: 7.5 mm Number of attempts: 1 Airway Equipment and Method: stylet Placement Confirmation: ETT inserted through vocal cords under direct vision,  positive ETCO2 and breath sounds checked- equal and bilateral Secured at: 22 cm Tube secured with: Tape Dental Injury: Teeth and Oropharynx as per pre-operative assessment

## 2011-02-22 ENCOUNTER — Other Ambulatory Visit (INDEPENDENT_AMBULATORY_CARE_PROVIDER_SITE_OTHER): Payer: Medicare Other

## 2011-02-22 ENCOUNTER — Encounter (HOSPITAL_COMMUNITY): Payer: Self-pay | Admitting: Surgery

## 2011-02-22 ENCOUNTER — Other Ambulatory Visit: Payer: Self-pay | Admitting: *Deleted

## 2011-02-22 DIAGNOSIS — Z86718 Personal history of other venous thrombosis and embolism: Secondary | ICD-10-CM

## 2011-02-22 MED ORDER — ENOXAPARIN SODIUM 80 MG/0.8ML ~~LOC~~ SOLN: 80.0000 mg | SUBCUTANEOUS | Status: DC

## 2011-02-22 NOTE — Progress Notes (Signed)
01/30/11 IPSS RESULTS =0200001 SCORE=3

## 2011-02-22 NOTE — Patient Instructions (Signed)
  Latest dosing instructions   Total Sun Mon Tue Wed Thu Fri Sat   35 5 mg 5 mg 5 mg 5 mg 5 mg 5 mg 5 mg    (5 mg1) (5 mg1) (5 mg1) (5 mg1) (5 mg1) (5 mg1) (5 mg1)        

## 2011-02-23 ENCOUNTER — Telehealth (INDEPENDENT_AMBULATORY_CARE_PROVIDER_SITE_OTHER): Payer: Self-pay

## 2011-02-23 ENCOUNTER — Encounter (INDEPENDENT_AMBULATORY_CARE_PROVIDER_SITE_OTHER): Payer: Medicare Other

## 2011-02-23 ENCOUNTER — Telehealth: Payer: Self-pay | Admitting: Internal Medicine

## 2011-02-23 NOTE — Telephone Encounter (Signed)
Spoke to pt- he has used a stool softener x 3 days. He states that it comes to a point and and then it doesn't come out. He is straining but doesn't want to strain to hard to pull anything. He had surgery on tues.

## 2011-02-23 NOTE — Telephone Encounter (Signed)
Pt aware of this. 

## 2011-02-23 NOTE — Telephone Encounter (Signed)
Have him call surgeon

## 2011-02-23 NOTE — Telephone Encounter (Signed)
Recently had hernia surgery and is extremely constipated. Has tried Miralax, M.O.M. He stated that he is keeping fluids down. Wants to try an enema. Wants a call back from nurse. Patent refused ov. Thanks.

## 2011-02-23 NOTE — Telephone Encounter (Signed)
Pt calling in having problems with constipation after hernia repair this wk. The pt has tried taking one dose of milk of magnesia. I advised for pt to take another shot of milk of magnesia and to stay on liquids till he has a BM. I also advised for pt to do an enema b/c it sounds like he has some hard stool that he can't pass on it's on. The pt understands./ [facility]

## 2011-02-23 NOTE — Telephone Encounter (Addendum)
C/o of a small bleeding area behind steri strips after BM today. Wife stated PT level was 1.1 yesterday.  Steri strips still in use. Patient advised rest and apply ice pack to the incision.  If the bleeding should continue or increase call the office. (Per Dr. Gerrit Friends)

## 2011-02-26 ENCOUNTER — Ambulatory Visit (INDEPENDENT_AMBULATORY_CARE_PROVIDER_SITE_OTHER): Payer: Medicare Other | Admitting: Internal Medicine

## 2011-02-26 ENCOUNTER — Ambulatory Visit: Payer: Medicare Other | Admitting: Oncology

## 2011-02-26 ENCOUNTER — Other Ambulatory Visit: Payer: Self-pay | Admitting: *Deleted

## 2011-02-26 ENCOUNTER — Other Ambulatory Visit: Payer: Medicare Other | Admitting: Lab

## 2011-02-26 DIAGNOSIS — Z86718 Personal history of other venous thrombosis and embolism: Secondary | ICD-10-CM

## 2011-02-26 MED ORDER — ENOXAPARIN SODIUM 80 MG/0.8ML ~~LOC~~ SOLN: 80.0000 mg | SUBCUTANEOUS | Status: DC

## 2011-02-26 NOTE — Patient Instructions (Signed)
  Latest dosing instructions   Total Sun Mon Tue Wed Thu Fri Sat   35 5 mg 5 mg 5 mg 5 mg 5 mg 5 mg 5 mg    (5 mg1) (5 mg1) (5 mg1) (5 mg1) (5 mg1) (5 mg1) (5 mg1)        

## 2011-02-28 ENCOUNTER — Ambulatory Visit: Payer: Medicare Other

## 2011-02-28 ENCOUNTER — Encounter: Payer: Medicare Other | Admitting: Physical Therapy

## 2011-02-28 DIAGNOSIS — Z86718 Personal history of other venous thrombosis and embolism: Secondary | ICD-10-CM

## 2011-02-28 DIAGNOSIS — Z7901 Long term (current) use of anticoagulants: Secondary | ICD-10-CM

## 2011-02-28 NOTE — Patient Instructions (Signed)
  Latest dosing instructions   Total Sun Mon Tue Wed Thu Fri Sat   35 5 mg 5 mg 5 mg 5 mg 5 mg 5 mg 5 mg    (5 mg1) (5 mg1) (5 mg1) (5 mg1) (5 mg1) (5 mg1) (5 mg1)        

## 2011-03-02 ENCOUNTER — Encounter: Payer: Medicare Other | Admitting: Physical Therapy

## 2011-03-02 ENCOUNTER — Ambulatory Visit: Payer: Medicare Other

## 2011-03-02 DIAGNOSIS — Z7901 Long term (current) use of anticoagulants: Secondary | ICD-10-CM

## 2011-03-02 DIAGNOSIS — Z86718 Personal history of other venous thrombosis and embolism: Secondary | ICD-10-CM

## 2011-03-02 LAB — POCT INR: INR: 2.4

## 2011-03-02 NOTE — Patient Instructions (Signed)
  Latest dosing instructions   Total Sun Mon Tue Wed Thu Fri Sat   35 5 mg 5 mg 5 mg 5 mg 5 mg 5 mg 5 mg    (5 mg1) (5 mg1) (5 mg1) (5 mg1) (5 mg1) (5 mg1) (5 mg1)        

## 2011-03-07 ENCOUNTER — Encounter: Payer: Self-pay | Admitting: Internal Medicine

## 2011-03-07 ENCOUNTER — Ambulatory Visit (INDEPENDENT_AMBULATORY_CARE_PROVIDER_SITE_OTHER): Payer: Medicare Other | Admitting: Internal Medicine

## 2011-03-07 DIAGNOSIS — Z86718 Personal history of other venous thrombosis and embolism: Secondary | ICD-10-CM

## 2011-03-07 DIAGNOSIS — Z0000 Encounter for general adult medical examination without abnormal findings: Secondary | ICD-10-CM

## 2011-03-07 DIAGNOSIS — J811 Chronic pulmonary edema: Secondary | ICD-10-CM

## 2011-03-07 DIAGNOSIS — M8448XA Pathological fracture, other site, initial encounter for fracture: Secondary | ICD-10-CM

## 2011-03-07 DIAGNOSIS — C9000 Multiple myeloma not having achieved remission: Secondary | ICD-10-CM

## 2011-03-07 DIAGNOSIS — I2699 Other pulmonary embolism without acute cor pulmonale: Secondary | ICD-10-CM

## 2011-03-07 DIAGNOSIS — K219 Gastro-esophageal reflux disease without esophagitis: Secondary | ICD-10-CM

## 2011-03-07 LAB — POCT INR: INR: 1.8

## 2011-03-07 NOTE — Patient Instructions (Addendum)
The patient is instructed to continue all medications as prescribed. Schedule followup with check out clerk upon leaving the clinic     Latest dosing instructions   Total Sun Mon Tue Wed Thu Fri Sat   35 5 mg 5 mg 5 mg 5 mg 5 mg 5 mg 5 mg    (5 mg1) (5 mg1) (5 mg1) (5 mg1) (5 mg1) (5 mg1) (5 mg1)        

## 2011-03-07 NOTE — Progress Notes (Signed)
Subjective:    Patient ID: Rodney [name], male    DOB: 1940/03/23, 71 y.o.   MRN: [id number]  HPI Pt presents for post operative care.  He recently underwent hernia repair during that time we managed his anticoagulation status due to his history of pulmonary emboli.  He also has a complex history of multiple myeloma this diagnosis was first suspected in 2002 and a bone survey at that time was negative.  He developed a pathologic fracture and required back surgery in October of this year he has undergone a bone marrow biopsy which shows that he does have multiple myeloma.  He is scheduled to undergo radiation therapy for the lytic lesions and consideration for post procedure chemotherapy. He does not have a good relationship at this time with oncology due to a conflict during his hospitalization with consultant.  He is requesting a second opinion and we have referred him to [facility] to discuss appropriate adjuvant therapy after radiation.    Review of Systems  Constitutional: Negative for fever and fatigue.  HENT: Negative for hearing loss, congestion, neck pain and postnasal drip.   Eyes: Negative for discharge, redness and visual disturbance.  Respiratory: Negative for cough, shortness of breath and wheezing.   Cardiovascular: Negative for leg swelling.  Gastrointestinal: Negative for abdominal pain, constipation and abdominal distention.  Genitourinary: Negative for urgency and frequency.  Musculoskeletal: Negative for joint swelling and arthralgias.  Skin: Negative for color change and rash.  Neurological: Negative for weakness and light-headedness.  Hematological: Negative for adenopathy.  Psychiatric/Behavioral: Negative for behavioral problems.   Past Medical History  Diagnosis Date  . Pulmonary embolism   . Pancreatitis   . Pulmonary embolism   . Complication of anesthesia 02-16-11    Pt. speaks of awakening during the surgery from back  surgery  . Shortness  of breath 02-16-11    hx. Pulmonary emboli x2 (9 yrs/ 3'12 -last)  . Anemia 02-16-11    01-05-11- post surgery-Transfusions x 4 units  . Prostate cancer   . Multiple myeloma 02-16-11    suspected tumor  left sacral  . Degenerative disc disease 02-16-11    01-05-11 L3 fusion done  . Hernia 02-16-11    left inguinal hernia at present    History   Social History  . Marital Status: Married    Spouse Name: N/A    Number of Children: N/A  . Years of Education: N/A   Occupational History  . Not on file.   Social History Main Topics  . Smoking status: Former Smoker    Quit date: 02/15/1969  . Smokeless tobacco: Not on file   Comment: quit 50 yrs  . Alcohol Use: No  . Drug Use: No  . Sexually Active: Yes   Other Topics Concern  . Not on file   Social History Narrative   MarriedRegular exercise-yes    Past Surgical History  Procedure Date  . Back surgery 02-16-11     01-05-11 Lumbar surgery L3(complicated by loss of blood volume)/ 01-09-11 then Lumbar fusion done with retained  hardware   . Cholecystectomy 04/06/2010    laparoscopic-inflammation with stones  . Inguinal hernia repair 02/20/2011    Procedure: HERNIA REPAIR INGUINAL ADULT;  Surgeon: Velora Heckler, MD;  Location: [facility];  Service: General;  Laterality: Left;  Repair Left Inguinal Hernia with Mesh    Family History  Problem Relation Age of Onset  . Lymphoma Father 63  . Cancer Father   . Clotting  disorder Mother     blood clots    No Known Allergies  Current Outpatient Prescriptions on File Prior to Visit  Medication Sig Dispense Refill  . acetaminophen (TYLENOL) 500 MG tablet Take 1-2 tablets (500-1,000 mg total) by mouth every 6 (six) hours as needed for pain. For pain  30 tablet  1  . calcium carbonate (TUMS - DOSED IN MG ELEMENTAL CALCIUM) 500 MG chewable tablet Chew 1 tablet by mouth every 2 (two) hours as needed. HEART BURN        . Calcium-Vitamin D (CALTRATE 600 PLUS-VIT D PO) Take 1 tablet by  mouth every other day.        . methylcellulose (ARTIFICIAL TEARS) 1 % ophthalmic solution Place 2 drops into both eyes 2 (two) times daily as needed. DRY EYES       . methylcellulose packet Take by mouth as needed. Takes for bowel regulation as needed       . Multiple Vitamins-Minerals (MULTIVITAMINS THER. W/MINERALS) TABS Take 1 tablet by mouth every other day.        Marland Kitchen omeprazole (PRILOSEC) 20 MG capsule Take 20 mg by mouth 2 (two) times daily as needed. HEART BURN       . warfarin (COUMADIN) 5 MG tablet Take 2.5-5 mg by mouth every evening. 5 MG FOR 2 DAYS THEN 2.5 MG FOR 1 DAY. THEN BACK TO 5 MG FOR 2 DAYS         BP 140/80  Pulse 72  Temp 98.2 F (36.8 C)  Resp 16  Ht 5\' 11"  (1.803 m)  Wt 206 lb (93.441 kg)  BMI 28.73 kg/m2       Objective:   Physical Exam  Nursing note and vitals reviewed. Constitutional: He appears well-developed and well-nourished.  HENT:  Head: Normocephalic and atraumatic.  Eyes: Conjunctivae are normal. Pupils are equal, round, and reactive to light.  Neck: Normal range of motion. Neck supple.  Cardiovascular: Normal rate and regular rhythm.   Pulmonary/Chest: Effort normal and breath sounds normal.  Abdominal: Soft. Bowel sounds are normal.          Assessment & Plan:  Complex patient in with multiple factors that influence successful ongoing therapy including compliance and personality issues. I believe he will require close followup during this treatment window.  I will refer him to [facility] for second opinion. He most probably will quiet some form of adjuvant therapy after he has the radiation therapy for lytic lesions.  We had a detailed discussion with the patient about his diagnosis, treatment options and his current condition. Other than back pain he is doing remarkably well following his hernia surgery with a quick recovery that was unexpectedly complication free.  Disposition from low molecular weight heparin to Coumadin has  been smooth without complication today's INR was slightly below goal and his Coumadin was increased to 5 mg daily. A protime will be obtained in 2 weeks this dose is similar to the stable dose he was on after his hospitalization in May for pulmonary emboli.  Other areas of concern include his history of a duodenal ulcer and his use of proton pump inhibitor he states that he ran out of the Prilosec and we reviewed with him the fact the Prilosec as an over-the-counter medication that he can obtain at any drugstore. He should remain on this medication on a daily basis and if he develops gastric discomfort or dark stools he should contact our office immediately.  Surveillance CBC with  differential should be obtained as well as a protime today. The results of the CBC was pending at the time of this dictation.

## 2011-03-08 ENCOUNTER — Ambulatory Visit
Admission: RE | Admit: 2011-03-08 | Discharge: 2011-03-08 | Disposition: A | Discharge status: Home / Self Care | Payer: Medicare Other | Source: Ambulatory Visit | Attending: Radiation Oncology | Admitting: Radiation Oncology

## 2011-03-08 DIAGNOSIS — C9000 Multiple myeloma not having achieved remission: Secondary | ICD-10-CM

## 2011-03-08 LAB — CBC WITH DIFFERENTIAL/PLATELET
Eosinophils Relative: 3.1 % (ref 0.0–5.0)
HCT: 40.9 % (ref 39.0–52.0)
Lymphs Abs: 2.5 10*3/uL (ref 0.7–4.0)
MCV: 86.6 fl (ref 78.0–100.0)
Monocytes Absolute: 1.1 10*3/uL — ABNORMAL HIGH (ref 0.1–1.0)
Platelets: 414 10*3/uL — ABNORMAL HIGH (ref 150.0–400.0)
RDW: 15.9 % — ABNORMAL HIGH (ref 11.5–14.6)

## 2011-03-08 NOTE — Patient Care Conference (Signed)
Met with patient to discuss RO billing.  Patient had no concerns today. 

## 2011-03-08 NOTE — Progress Notes (Signed)
Simulation/treatment planning note  The patient was taken to the CT simulator and placed supine. An alpha cradle was constructed for immobilization. His abdomen and pelvis was scanned. 2 isocenters were placed, one along L3 and the other along the left sacrum. He was set up from L2-L4 with AP and PA fields. He was then set up to his sacrum with PA, left lateral, and right lateral fields. 3 separate multileaf collimators were designed for his sacral field. I prescribing 3600 cGy in 18 sessions utilizing mixed energy photons. Isodose plans and dosimetry are requested.

## 2011-03-13 ENCOUNTER — Telehealth: Payer: Self-pay | Admitting: Internal Medicine

## 2011-03-13 NOTE — Telephone Encounter (Signed)
Do you know what he is talking about?

## 2011-03-13 NOTE — Telephone Encounter (Signed)
Pt was seen recently requesting note from Dr Arline Asp from about 2 yrs ago. Pt stated doc was unable to obtain note at visit.

## 2011-03-14 NOTE — Telephone Encounter (Signed)
Talked with wife and informed her that oncology owns those records and they will have to be the ones to give them a copy

## 2011-03-14 NOTE — Telephone Encounter (Signed)
He is requesting a copy of the oncology consult in 2010

## 2011-03-15 ENCOUNTER — Ambulatory Visit
Admission: RE | Admit: 2011-03-15 | Discharge: 2011-03-15 | Disposition: A | Discharge status: Home / Self Care | Payer: Medicare Other | Source: Ambulatory Visit | Attending: Radiation Oncology | Admitting: Radiation Oncology

## 2011-03-15 ENCOUNTER — Encounter: Payer: Self-pay | Admitting: Radiation Oncology

## 2011-03-15 ENCOUNTER — Telehealth: Payer: Self-pay | Admitting: Oncology

## 2011-03-15 NOTE — Procedures (Signed)
Rodney [name] underwent simulation verification today for treatment to his lumbar spine and sacrum.  His isodose centers are in good position and the multileaf collimators contour the treatment volume appropriately.    ______________________________ Maryln Gottron, M.D. RJM/MEDQ  D:  03/15/2011  T:  03/15/2011  Job:  [id number]

## 2011-03-15 NOTE — Telephone Encounter (Signed)
Gv pt appt for jan2013 °

## 2011-03-16 ENCOUNTER — Other Ambulatory Visit: Payer: Medicare Other | Admitting: Lab

## 2011-03-16 ENCOUNTER — Ambulatory Visit: Payer: Medicare Other | Admitting: Oncology

## 2011-03-19 ENCOUNTER — Encounter (INDEPENDENT_AMBULATORY_CARE_PROVIDER_SITE_OTHER): Payer: Self-pay | Admitting: Surgery

## 2011-03-19 ENCOUNTER — Ambulatory Visit
Admission: RE | Admit: 2011-03-19 | Discharge: 2011-03-19 | Disposition: A | Discharge status: Home / Self Care | Payer: Medicare Other | Source: Ambulatory Visit | Attending: Radiation Oncology | Admitting: Radiation Oncology

## 2011-03-19 ENCOUNTER — Ambulatory Visit (INDEPENDENT_AMBULATORY_CARE_PROVIDER_SITE_OTHER): Payer: Medicare Other | Admitting: Surgery

## 2011-03-19 VITALS — BP 122/78 | HR 70 | Temp 98.3°F | Resp 18 | Ht 71.0 in | Wt 197.0 lb

## 2011-03-19 VITALS — BP 125/80 | HR 75 | Temp 97.0°F | Wt 211.1 lb

## 2011-03-19 DIAGNOSIS — C9000 Multiple myeloma not having achieved remission: Secondary | ICD-10-CM

## 2011-03-19 DIAGNOSIS — K4090 Unilateral inguinal hernia, without obstruction or gangrene, not specified as recurrent: Secondary | ICD-10-CM

## 2011-03-19 NOTE — Progress Notes (Signed)
Mr Popper reports that when he gets up in the am with tightness in mid lower back with mild discomfort over the right hip with what he considers as "good" flexibility but later in the day his lower back hips and his "buns" have a throbbing discomfort and decreased flexibility.    1/ 18 fractions to lumbar spine/sacrum

## 2011-03-19 NOTE — Progress Notes (Signed)
Weekly Management Note:  Site: Lumbar and sacral spine Current Dose:  200  cGy Projected Dose: 3600  cGy  Narrative: The patient is seen today for routine under treatment assessment. CBCT/MVCT images/port films were reviewed. The chart was reviewed.   No complaints today.  Physical Examination:  Filed Vitals:   03/19/11 0936  BP: 125/80  Pulse: 75  Temp: 97 F (36.1 C)  .  Weight: 211 lb 1.6 oz (95.754 kg). No change  Impression: Tolerating radiation therapy well.  Plan: Continue radiation therapy as planned.

## 2011-03-19 NOTE — Patient Instructions (Signed)
  COCOA BUTTER & VITAMIN E CREAM  ([employer] or other brand)  Apply cocoa butter/vitamin E cream to your incision 2 - 3 times daily.  Massage cream into incision for one minute with each application.  Use sunscreen (50 SPF or higher) for first 6 months after surgery.  You may substitute Mederma or other scar reducing creams as desired.   

## 2011-03-19 NOTE — Progress Notes (Signed)
Visit Diagnoses: 1. INGUINAL HERNIA    HISTORY: Patient is a 71 year old white male who returns for his first postoperative visit having undergone left inguinal hernia repair with mesh in December 2012. Postoperative course was complicated only by constipation which is now resolved.  EXAM: Left groin incision is well healed. Minimal soft tissue swelling. No sign of infection. With cough and Valsalva in a standing position there is no sign of recurrence.  IMPRESSION: Status post left inguinal hernia repair with mesh, no sign of recurrence  PLAN: Patient will begin applying topical creams his incision. He is restricted to 25 pounds lifting for the next 3 weeks. He will return to see me in this office as needed.  Velora Heckler, MD, FACS General & Endocrine Surgery [facility]

## 2011-03-20 ENCOUNTER — Ambulatory Visit
Admission: RE | Admit: 2011-03-20 | Discharge: 2011-03-20 | Disposition: A | Discharge status: Home / Self Care | Payer: Medicare Other | Source: Ambulatory Visit | Attending: Radiation Oncology | Admitting: Radiation Oncology

## 2011-03-21 ENCOUNTER — Ambulatory Visit (INDEPENDENT_AMBULATORY_CARE_PROVIDER_SITE_OTHER): Payer: Medicare Other

## 2011-03-21 ENCOUNTER — Ambulatory Visit
Admission: RE | Admit: 2011-03-21 | Discharge: 2011-03-21 | Disposition: A | Discharge status: Home / Self Care | Payer: Medicare Other | Source: Ambulatory Visit | Attending: Radiation Oncology | Admitting: Radiation Oncology

## 2011-03-21 DIAGNOSIS — Z7901 Long term (current) use of anticoagulants: Secondary | ICD-10-CM

## 2011-03-21 DIAGNOSIS — Z86718 Personal history of other venous thrombosis and embolism: Secondary | ICD-10-CM

## 2011-03-21 NOTE — Patient Instructions (Signed)
  Latest dosing instructions   Total Sun Mon Tue Wed Thu Fri Sat   35 5 mg 5 mg 5 mg 5 mg 5 mg 5 mg 5 mg    (5 mg1) (5 mg1) (5 mg1) (5 mg1) (5 mg1) (5 mg1) (5 mg1)        

## 2011-03-22 ENCOUNTER — Ambulatory Visit
Admission: RE | Admit: 2011-03-22 | Discharge: 2011-03-22 | Disposition: A | Discharge status: Home / Self Care | Payer: Medicare Other | Source: Ambulatory Visit | Attending: Radiation Oncology | Admitting: Radiation Oncology

## 2011-03-23 ENCOUNTER — Ambulatory Visit
Admission: RE | Admit: 2011-03-23 | Discharge: 2011-03-23 | Disposition: A | Discharge status: Home / Self Care | Payer: Medicare Other | Source: Ambulatory Visit | Attending: Radiation Oncology | Admitting: Radiation Oncology

## 2011-03-26 ENCOUNTER — Ambulatory Visit
Admission: RE | Admit: 2011-03-26 | Discharge: 2011-03-26 | Disposition: A | Discharge status: Home / Self Care | Payer: Medicare Other | Source: Ambulatory Visit | Attending: Radiation Oncology | Admitting: Radiation Oncology

## 2011-03-26 ENCOUNTER — Encounter: Payer: Self-pay | Admitting: Radiation Oncology

## 2011-03-26 VITALS — Wt 210.9 lb

## 2011-03-26 DIAGNOSIS — C9000 Multiple myeloma not having achieved remission: Secondary | ICD-10-CM

## 2011-03-26 NOTE — Progress Notes (Signed)
Post sim done; gave pt "Radiation and You" booklet. All questions answered.   Pt states he has occass nausea, appetite not affected. Takes Tylenol ES prn for back, sacral pain; states "it takes edge off".  Pt states "he can feel where he getting radiation. It aches."

## 2011-03-26 NOTE — Progress Notes (Signed)
Weekly Management Note: Site:L spine/sacrum Current Dose:  1200  cGy Projected Dose: 3600  cGy  Narrative: The patient is seen today for routine under treatment assessment. CBCT/MVCT images/port films were reviewed. The chart was reviewed.   Without complaints today. He does take an occasional Tylenol when necessary.  Physical Examination: There were no vitals filed for this visit..  Weight: 210 lb 14.4 oz (95.664 kg). No change.  Impression: Tolerating radiation therapy well.  Plan: Continue radiation therapy as planned. He tells me that he will begin a second opinion regarding his systemic management at [facility].

## 2011-03-27 ENCOUNTER — Ambulatory Visit
Admission: RE | Admit: 2011-03-27 | Discharge: 2011-03-27 | Disposition: A | Discharge status: Home / Self Care | Payer: Medicare Other | Source: Ambulatory Visit | Attending: Radiation Oncology | Admitting: Radiation Oncology

## 2011-03-28 ENCOUNTER — Ambulatory Visit
Admission: RE | Admit: 2011-03-28 | Discharge: 2011-03-28 | Disposition: A | Discharge status: Home / Self Care | Payer: Medicare Other | Source: Ambulatory Visit | Attending: Radiation Oncology | Admitting: Radiation Oncology

## 2011-03-29 ENCOUNTER — Ambulatory Visit
Admission: RE | Admit: 2011-03-29 | Discharge: 2011-03-29 | Disposition: A | Discharge status: Home / Self Care | Payer: Medicare Other | Source: Ambulatory Visit | Attending: Radiation Oncology | Admitting: Radiation Oncology

## 2011-03-29 NOTE — Progress Notes (Signed)
Encounter addended by: Mary Clare Chesson, RN on: 03/29/2011  9:11 AM<BR>     Documentation filed: Inpatient Patient Education

## 2011-03-30 ENCOUNTER — Ambulatory Visit
Admission: RE | Admit: 2011-03-30 | Discharge: 2011-03-30 | Disposition: A | Discharge status: Home / Self Care | Payer: Medicare Other | Source: Ambulatory Visit | Attending: Radiation Oncology | Admitting: Radiation Oncology

## 2011-04-02 ENCOUNTER — Ambulatory Visit
Admission: RE | Admit: 2011-04-02 | Discharge: 2011-04-02 | Disposition: A | Discharge status: Home / Self Care | Payer: Medicare Other | Source: Ambulatory Visit | Attending: Radiation Oncology | Admitting: Radiation Oncology

## 2011-04-02 ENCOUNTER — Encounter: Payer: Self-pay | Admitting: Radiation Oncology

## 2011-04-02 ENCOUNTER — Other Ambulatory Visit: Payer: Self-pay | Admitting: *Deleted

## 2011-04-02 VITALS — Wt 210.6 lb

## 2011-04-02 DIAGNOSIS — K219 Gastro-esophageal reflux disease without esophagitis: Secondary | ICD-10-CM

## 2011-04-02 DIAGNOSIS — C9000 Multiple myeloma not having achieved remission: Secondary | ICD-10-CM

## 2011-04-02 DIAGNOSIS — K903 Pancreatic steatorrhea: Secondary | ICD-10-CM

## 2011-04-02 NOTE — Progress Notes (Signed)
Pt states he "feels the best he'll feel in the mornings".

## 2011-04-02 NOTE — Progress Notes (Signed)
Weekly Management Note:  Site:L Spine/Sacrum Current Dose:  2200  cGy Projected Dose: 3600  cGy  Narrative: The patient is seen today for routine under treatment assessment. CBCT/MVCT images/port films were reviewed. The chart was reviewed.   His back pain is improved although his discomfort does worsen by late afternoon. He'll be seeing Dr. Donnie Coffin tomorrow in consultation, and he tells me he will also be going to [facility] for a second opinion.  Physical Examination: There were no vitals filed for this visit..  Weight: 210 lb 9.6 oz (95.528 kg). No change.  Impression: Tolerating radiation therapy well.  Plan: Continue radiation therapy as planned.

## 2011-04-03 ENCOUNTER — Ambulatory Visit
Admission: RE | Admit: 2011-04-03 | Discharge: 2011-04-03 | Disposition: A | Discharge status: Home / Self Care | Payer: Medicare Other | Source: Ambulatory Visit | Attending: Radiation Oncology | Admitting: Radiation Oncology

## 2011-04-03 ENCOUNTER — Other Ambulatory Visit (HOSPITAL_BASED_OUTPATIENT_CLINIC_OR_DEPARTMENT_OTHER): Payer: Medicare Other

## 2011-04-03 ENCOUNTER — Ambulatory Visit (HOSPITAL_BASED_OUTPATIENT_CLINIC_OR_DEPARTMENT_OTHER): Payer: Medicare Other | Admitting: Oncology

## 2011-04-03 VITALS — BP 133/81 | HR 66 | Temp 98.2°F | Ht 71.0 in | Wt 215.9 lb

## 2011-04-03 DIAGNOSIS — K903 Pancreatic steatorrhea: Secondary | ICD-10-CM

## 2011-04-03 DIAGNOSIS — D472 Monoclonal gammopathy: Secondary | ICD-10-CM

## 2011-04-03 DIAGNOSIS — C9000 Multiple myeloma not having achieved remission: Secondary | ICD-10-CM

## 2011-04-03 DIAGNOSIS — K219 Gastro-esophageal reflux disease without esophagitis: Secondary | ICD-10-CM

## 2011-04-03 LAB — CBC WITH DIFFERENTIAL/PLATELET
Basophils Absolute: 0.1 10*3/uL (ref 0.0–0.1)
Eosinophils Absolute: 0.4 10*3/uL (ref 0.0–0.5)
HCT: 41.1 % (ref 38.4–49.9)
LYMPH%: 14.7 % (ref 14.0–49.0)
MCV: 83 fL (ref 79.3–98.0)
MONO%: 12.9 % (ref 0.0–14.0)
NEUT#: 4.5 10*3/uL (ref 1.5–6.5)
NEUT%: 66.6 % (ref 39.0–75.0)
Platelets: 215 10*3/uL (ref 140–400)
RBC: 4.95 10*6/uL (ref 4.20–5.82)

## 2011-04-03 LAB — COMPREHENSIVE METABOLIC PANEL
Alkaline Phosphatase: 115 U/L (ref 39–117)
BUN: 10 mg/dL (ref 6–23)
CO2: 26 mEq/L (ref 19–32)
Creatinine, Ser: 0.78 mg/dL (ref 0.50–1.35)
Glucose, Bld: 80 mg/dL (ref 70–99)
Total Bilirubin: 0.2 mg/dL — ABNORMAL LOW (ref 0.3–1.2)

## 2011-04-04 ENCOUNTER — Ambulatory Visit
Admission: RE | Admit: 2011-04-04 | Discharge: 2011-04-04 | Disposition: A | Discharge status: Home / Self Care | Payer: Medicare Other | Source: Ambulatory Visit | Attending: Radiation Oncology | Admitting: Radiation Oncology

## 2011-04-04 ENCOUNTER — Telehealth: Payer: Self-pay | Admitting: *Deleted

## 2011-04-04 NOTE — Progress Notes (Signed)
Referral MD Rodney [name]  [phone]    Reason for Referral: history of plasmacytoma  Chief Complaint  Patient presents with  . Multiple Myeloma  : This patient has previously been seen by Rodney Rodney [name].  He had been evaluated in 2010 for back pain and light chain abnormality. At that time he had a small lesion at L3 , which was felt tobe a hemangioma. He had been having back pain off and on for a number of years. Pain became worse and ultimately disabling when he was admitted to [facility] hospital. He was still able to use his legs . An MRI showed a destructive lesion through L3 causing displacement of bony fragments posteriorly. He underwent surgery for this on 01/05/11 and had  Stabilizing rods placed as well. He is in an outpatient rehab program and is doing extremely well. His pain is managed with minimal meds (robaxin, ultram and tylenol). He is  Seeing  Rodney Rodney [name] who is performing xrt to L3 and sacrum . He is tolerating it well, apart from mild ongoing pain. He is s/p inguinal hernia repainr by Rodney [name]  In December 2012. He is on chronic coumadin for recurrent PE. The 2nd episode took place after a cholecystectomy where he was given lovenox for 2 weeks, and had a PE after the 3rd week, HPI:   Past Medical History  Diagnosis Date  . Pulmonary embolism X 2 , 1st after prostate surgery  . Prostate cancer 2002  . Pancreatitis Recurrent episodes prior to 2/12  . Multiple myeloma Being assessd  . Pulmonary embolism   . Degenerative disc disease   . Anemia   . Hernia current  :  Past Surgical History  Procedure Date  . Cholecystectomy 04/06/2010  . Back surgery   :  Current outpatient prescriptions:acetaminophen (TYLENOL) 500 MG tablet, Take 1-2 tablets (500-1,000 mg total) by mouth every 6 (six) hours as needed for pain. For pain, Disp: 30 tablet, Rfl: 1;  methocarbamol (ROBAXIN) 500 MG tablet, Take 500 mg by mouth 4 (four) times daily.  , Disp: , Rfl: ;  omeprazole (PRILOSEC) 20 MG  capsule, Take 20 mg by mouth 2 (two) times daily.  , Disp: , Rfl:  traMADol (ULTRAM) 50 MG tablet, Take 50 mg by mouth every 6 (six) hours as needed. Maximum dose= 8 tablets per day , Disp: , Rfl: ;  warfarin (COUMADIN) 5 MG tablet, To be titrated by phramacy, goal inr 2-3, Disp: 30 tablet, Rfl: 6;  enoxaparin (LOVENOX) 80 MG/0.8ML SOLN injection, Inject 0.8 mLs (80 mg total) into the skin daily., Disp: 0.8 mL, Rfl: 9 ferrous fumarate-b12-vitamic C-folic acid (TRINSICON / FOLTRIN) capsule, Take 1 capsule by mouth 3 (three) times daily after meals., Disp: 90 capsule, Rfl: 1:    :  No Known Allergies:  Family History  Problem Relation Age of Onset  . Lymphoma Father 4  . Cancer Father   . Clotting disorder Mother at age 73     blood clots in both grandparents and 1 uncle, 3 brothers in good health  :  History   Social History  . Marital Status: Married x 3    Spouse Name: N/A    Number of Children: 3 children ( all diabetics)  . Years of Education: N/A   Occupational History  . Not on file. Works [employer], travels extensively   Social History Main Topics  . Smoking status: Former Games developer  . Smokeless tobacco: Not on file   Comment: quit 50 yrs  .  Alcohol Use: No  . Drug Use: No  . Sexually Active: Yes   Other Topics Concern  . Not on file   Social History Narrative   MarriedRegular exercise-yes  :  A comprehensive review of systems was negative except for: Musculoskeletal: positive for back pain  Exam:  General appearance: alert, cooperative and appears stated age, back brace in place HEENT: no thrush, no adenopathy, anicteric, no thyromegaly Nodes- normal Lungs: normal CVS: normal Abdomen: normal Lt inguinal hernia Ext: normal   Basename 04/03/11 0953  WBC 6.8  HGB 13.5  HCT 41.1  PLT 215    Basename 04/03/11 0953  NA 137  K 3.9  CL 103  CO2 26  GLUCOSE 80  BUN 10  CREATININE 0.78  CALCIUM 9.4    Blood smear review:  n/a  Pathology:plasma cells in vertebral lesions.  No results found.  Assessment and Plan: pleasant man with hx of plasmacytoma at L3 and likely sacrum. He has a history of light chain abnormality. He is doing well and will be completing xrt in ~ 1 week. His bmbx revealed 6% plasma cells. I discussed with him various treatment options including induction chemotherapy with a imid based regimen followed by stem cell transplant. He wishes to be aggressive and I will discuss his case with Rodney Rodney [name] [facility]. In the interim, we will begin zometa therapy and see him in f/u in 1 month.

## 2011-04-04 NOTE — Telephone Encounter (Signed)
gave referral to kim in medical records for stem cell transplant

## 2011-04-05 ENCOUNTER — Ambulatory Visit
Admission: RE | Admit: 2011-04-05 | Discharge: 2011-04-05 | Disposition: A | Discharge status: Home / Self Care | Payer: Medicare Other | Source: Ambulatory Visit | Attending: Radiation Oncology | Admitting: Radiation Oncology

## 2011-04-05 ENCOUNTER — Encounter: Payer: Self-pay | Admitting: Internal Medicine

## 2011-04-05 ENCOUNTER — Telehealth: Payer: Self-pay | Admitting: Oncology

## 2011-04-05 ENCOUNTER — Ambulatory Visit (INDEPENDENT_AMBULATORY_CARE_PROVIDER_SITE_OTHER): Payer: Medicare Other | Admitting: Internal Medicine

## 2011-04-05 VITALS — BP 120/76 | HR 72 | Temp 98.2°F | Resp 16 | Ht 71.0 in | Wt 214.0 lb

## 2011-04-05 DIAGNOSIS — Z86718 Personal history of other venous thrombosis and embolism: Secondary | ICD-10-CM

## 2011-04-05 DIAGNOSIS — C9000 Multiple myeloma not having achieved remission: Secondary | ICD-10-CM

## 2011-04-05 DIAGNOSIS — M549 Dorsalgia, unspecified: Secondary | ICD-10-CM

## 2011-04-05 MED ORDER — TRAMADOL HCL 50 MG PO TABS: 50.0000 mg | ORAL_TABLET | Freq: Three times a day (TID) | ORAL | Status: DC | PRN

## 2011-04-05 NOTE — Telephone Encounter (Signed)
Pt appt. With Dr. Lucretia Roers is 04/18/11 @ 10:00. Faxed  Medical records. Slides and scans will be fedex'ed. Pt is aware.

## 2011-04-05 NOTE — Progress Notes (Signed)
Subjective:    Patient ID: Rodney [name], male    DOB: 08-Aug-1940, 71 y.o.   MRN: [id number]  HPI  Undergoing radiation therapy for back Multiple myeloma. Has some side effects with mild diarrhea and back pain Has not been taking anything for the pain He is exercising!   Review of Systems  Constitutional: Negative for fever and fatigue.  HENT: Negative for hearing loss, congestion, neck pain and postnasal drip.   Eyes: Negative for discharge, redness and visual disturbance.  Respiratory: Negative for cough, shortness of breath and wheezing.   Cardiovascular: Negative for leg swelling.  Gastrointestinal: Negative for abdominal pain, constipation and abdominal distention.  Genitourinary: Negative for urgency and frequency.  Musculoskeletal: Negative for joint swelling and arthralgias.  Skin: Negative for color change and rash.  Neurological: Negative for weakness and light-headedness.  Hematological: Negative for adenopathy.  Psychiatric/Behavioral: Negative for behavioral problems.   Past Medical History  Diagnosis Date  . Pulmonary embolism   . Pancreatitis   . Pulmonary embolism   . Complication of anesthesia 02-16-11    Pt. speaks of awakening during the surgery from back  surgery  . Shortness of breath 02-16-11    hx. Pulmonary emboli x2 (9 yrs/ 3'12 -last)  . Anemia 02-16-11    01-05-11- post surgery-Transfusions x 4 units  . Prostate cancer   . Multiple myeloma 02-16-11    suspected tumor  left sacral  . Degenerative disc disease 02-16-11    01-05-11 L3 fusion done  . Hernia 02-16-11    left inguinal hernia at present    History   Social History  . Marital Status: Married    Spouse Name: N/A    Number of Children: N/A  . Years of Education: N/A   Occupational History  . Not on file.   Social History Main Topics  . Smoking status: Former Smoker    Quit date: 02/15/1969  . Smokeless tobacco: Never Used   Comment: quit 50 yrs  . Alcohol Use: No  .  Drug Use: No  . Sexually Active: Yes   Other Topics Concern  . Not on file   Social History Narrative   MarriedRegular exercise-yes    Past Surgical History  Procedure Date  . Back surgery 02-16-11     01-05-11 Lumbar surgery L3(complicated by loss of blood volume)/ 01-09-11 then Lumbar fusion done with retained  hardware   . Cholecystectomy 04/06/2010    laparoscopic-inflammation with stones  . Inguinal hernia repair 02/20/2011    Procedure: HERNIA REPAIR INGUINAL ADULT;  Surgeon: Velora Heckler, MD;  Location: [facility];  Service: General;  Laterality: Left;  Repair Left Inguinal Hernia with Mesh  . Hernia repair 02/20/11    Inguinal hernia repair w/mesh    Family History  Problem Relation Age of Onset  . Lymphoma Father 64  . Cancer Father   . Clotting disorder Mother     blood clots    No Known Allergies  Current Outpatient Prescriptions on File Prior to Visit  Medication Sig Dispense Refill  . acetaminophen (TYLENOL) 500 MG tablet Take 1-2 tablets (500-1,000 mg total) by mouth every 6 (six) hours as needed for pain. For pain  30 tablet  1  . calcium carbonate (TUMS - DOSED IN MG ELEMENTAL CALCIUM) 500 MG chewable tablet Chew 1 tablet by mouth every 2 (two) hours as needed. HEART BURN        . Calcium-Vitamin D (CALTRATE 600 PLUS-VIT D PO) Take 1 tablet by  mouth every other day.        . methylcellulose (ARTIFICIAL TEARS) 1 % ophthalmic solution Place 2 drops into both eyes 2 (two) times daily as needed. DRY EYES       . methylcellulose packet Take by mouth as needed. Takes for bowel regulation as needed       . Multiple Vitamins-Minerals (MULTIVITAMINS THER. W/MINERALS) TABS Take 1 tablet by mouth every other day.        Marland Kitchen omeprazole (PRILOSEC) 20 MG capsule Take 20 mg by mouth 2 (two) times daily as needed. HEART BURN       . warfarin (COUMADIN) 5 MG tablet Take 5 mg by mouth every evening.         BP 120/76  Pulse 72  Temp 98.2 F (36.8 C)  Resp 16  Ht 5\' 11"  (1.803  m)  Wt 214 lb (97.07 kg)  BMI 29.85 kg/m2       Objective:   Physical Exam  Nursing note and vitals reviewed. Constitutional: He appears well-developed and well-nourished.  HENT:  Head: Normocephalic and atraumatic.  Eyes: Conjunctivae are normal. Pupils are equal, round, and reactive to light.  Neck: Normal range of motion. Neck supple.  Cardiovascular: Normal rate and regular rhythm.   Pulmonary/Chest: Effort normal and breath sounds normal.  Abdominal: Soft. Bowel sounds are normal.          Assessment & Plan:  Will we will give Ultram for pain associated with his radiation therapy to his back.  We discussed the nature of the pain from radiation to the involvement of the multiple myeloma in his back he had questions about the percentage of tumor that was seen on the biopsy and whether or not this mandated chemotherapy.  We discussed with the patient whether or not he would want a second opinion because he has some question about the cancer Center and we stated that we would assist him in any way possible should he not be comfortable with the current line of treatment.  Marland Kitchen

## 2011-04-06 ENCOUNTER — Ambulatory Visit: Payer: Medicare Other | Admitting: Internal Medicine

## 2011-04-06 ENCOUNTER — Ambulatory Visit: Payer: Medicare Other | Admitting: Oncology

## 2011-04-06 ENCOUNTER — Ambulatory Visit
Admission: RE | Admit: 2011-04-06 | Discharge: 2011-04-06 | Disposition: A | Discharge status: Home / Self Care | Payer: Medicare Other | Source: Ambulatory Visit | Attending: Radiation Oncology | Admitting: Radiation Oncology

## 2011-04-06 ENCOUNTER — Other Ambulatory Visit: Payer: Medicare Other | Admitting: Lab

## 2011-04-06 LAB — POCT INR: INR: 2.1

## 2011-04-06 NOTE — Patient Instructions (Signed)
  Latest dosing instructions   Total Sun Mon Tue Wed Thu Fri Sat   35 5 mg 5 mg 5 mg 5 mg 5 mg 5 mg 5 mg    (5 mg1) (5 mg1) (5 mg1) (5 mg1) (5 mg1) (5 mg1) (5 mg1)        

## 2011-04-09 ENCOUNTER — Ambulatory Visit
Admission: RE | Admit: 2011-04-09 | Discharge: 2011-04-09 | Disposition: A | Discharge status: Home / Self Care | Payer: Medicare Other | Source: Ambulatory Visit | Attending: Radiation Oncology | Admitting: Radiation Oncology

## 2011-04-09 VITALS — BP 123/83 | HR 71 | Temp 98.1°F | Wt 213.2 lb

## 2011-04-09 DIAGNOSIS — C9000 Multiple myeloma not having achieved remission: Secondary | ICD-10-CM

## 2011-04-09 NOTE — Progress Notes (Addendum)
Denies pain today, but he has discomfort in his right lumbar region, over his hip, when he sits and when he bends over.  Since his surgery he has difficulty putting on his right sock.  States he feels much better now  "all day long" s ince surgery.  He admits to having nausea "almost daily", but does not want to take any anti-emetics. Eating more bland foods.   16/18 fractions.

## 2011-04-09 NOTE — Progress Notes (Signed)
Weekly Management Note:  Site: LS spine Current Dose:  3200  cGy Projected Dose: 3600  cGy  Narrative: The patient is seen today for routine under treatment assessment. CBCT/MVCT images/port films were reviewed. The chart was reviewed.   He does report nausea almost every day, but no vomiting. He has nausea on the weekends which is not typical for radiation therapy. He takes Ultram when necessary pain. His blood counts remain satisfactory. He plans to visit [facility] today for second opinion. He tells me he will also visit [facility] for second opinion. Dr. Donnie Coffin plans on starting Zometa. Of note is that he does have discomfort along his right sacrum, presumably related to his discharge of disease along the sacrum.  Physical Examination:  Filed Vitals:   04/09/11 0832  BP: 123/83  Pulse: 71  Temp: 98.1 F (36.7 C)  .  Weight: 213 lb 3.2 oz (96.707 kg). There is palpable discomfort along the right sacrum in the vicinity of his lytic destruction.  Impression: Tolerating radiation therapy well, however he does have nausea for which he declines an anti-emetic. This may be related to his radiation therapy even though it does occur on weekends as well. His nausea to be related to his Ultram, but his nausea began when he began his radiation therapy. He finishes his radiation therapy this Wednesday.  Plan: Continue radiation therapy as planned. To finish radiation therapy this Wednesday and then return here for a one-month followup visit.

## 2011-04-10 ENCOUNTER — Ambulatory Visit
Admission: RE | Admit: 2011-04-10 | Discharge: 2011-04-10 | Disposition: A | Discharge status: Home / Self Care | Payer: Medicare Other | Source: Ambulatory Visit | Attending: Radiation Oncology | Admitting: Radiation Oncology

## 2011-04-11 ENCOUNTER — Encounter: Payer: Self-pay | Admitting: Radiation Oncology

## 2011-04-11 ENCOUNTER — Ambulatory Visit
Admission: RE | Admit: 2011-04-11 | Discharge: 2011-04-11 | Disposition: A | Discharge status: Home / Self Care | Payer: Medicare Other | Source: Ambulatory Visit | Attending: Radiation Oncology | Admitting: Radiation Oncology

## 2011-04-11 ENCOUNTER — Ambulatory Visit (INDEPENDENT_AMBULATORY_CARE_PROVIDER_SITE_OTHER): Payer: Medicare Other

## 2011-04-11 DIAGNOSIS — Z86718 Personal history of other venous thrombosis and embolism: Secondary | ICD-10-CM

## 2011-04-11 DIAGNOSIS — Z7901 Long term (current) use of anticoagulants: Secondary | ICD-10-CM

## 2011-04-11 LAB — POCT INR: INR: 2.3

## 2011-04-11 NOTE — Progress Notes (Signed)
Chart note:  On 03/08/2011 Rodney [name] underwent simulation/treatment planning. He had construction of 2 multileaf collimators for his lumbar spine, one AP, and one PA. Thus, he had construction of 6 complex treatment devices, 3 for the sacrum, to 4 the lumbar spine, and an alpha cradle.

## 2011-04-11 NOTE — Progress Notes (Signed)
[facility] Radiation Oncology  Name:Rodney [name]  Date:04/11/2011           XBJ:[id number] DOB:02-23-1941   Status:outpatient    CC: [name], MD, MD  Dr. Kimberlee Nearing, Dr. Pierce Crane  REFERRING PHYSICIAN: Dr. Kimberlee Nearing  DIAGNOSIS: Multiple plasmacytomas/myeloma    INDICATION FOR TREATMENT: Palliative   TREATMENT DATES: 03/19/2011 through 04/11/2011                          SITE/DOSE:   Lumbar spine/sacral spine 3000 60 cGy 18 sessions                         BEAMS/ENERGY:   18 MV photons parallel opposed anterior-posterior fields to lumbar spine. Mixed 6 MV and 18 MV photons the to the sacrum.                NARRATIVE:   The patient tolerated his treatment reasonably well, however he had persistent pain along his sacrum by completion of therapy. He also had mild nausea during his course of therapy.                         PLAN: Routine followup in one month. Patient instructed to call if questions or worsening complaints in interim.

## 2011-04-11 NOTE — Patient Instructions (Signed)
  Latest dosing instructions   Total Sun Mon Tue Wed Thu Fri Sat   35 5 mg 5 mg 5 mg 5 mg 5 mg 5 mg 5 mg    (5 mg1) (5 mg1) (5 mg1) (5 mg1) (5 mg1) (5 mg1) (5 mg1)        

## 2011-04-27 ENCOUNTER — Telehealth: Payer: Self-pay | Admitting: *Deleted

## 2011-04-27 NOTE — Telephone Encounter (Signed)
Pt saw Dr Lucretia Roers at [facility], and he was supposed to fax to Dr. Lovell Sheehan some tests he would like on the patient.   Please call pt when this fax is received.

## 2011-04-27 NOTE — Telephone Encounter (Signed)
xxx

## 2011-04-30 ENCOUNTER — Other Ambulatory Visit (INDEPENDENT_AMBULATORY_CARE_PROVIDER_SITE_OTHER): Payer: Medicare Other

## 2011-04-30 DIAGNOSIS — C9000 Multiple myeloma not having achieved remission: Secondary | ICD-10-CM

## 2011-04-30 DIAGNOSIS — Z86718 Personal history of other venous thrombosis and embolism: Secondary | ICD-10-CM

## 2011-04-30 LAB — POCT INR: INR: 2.5

## 2011-04-30 NOTE — Telephone Encounter (Signed)
Talked with wife and set up labs- ct and pet scan was done in October and will check with dr Lovell Sheehan to make sure tha t it is correct

## 2011-05-01 ENCOUNTER — Telehealth: Payer: Self-pay | Admitting: *Deleted

## 2011-05-01 NOTE — Telephone Encounter (Signed)
Please call pt, Rodney [name].  He is asking if leaving his urine out for 6 hours has ruined his test???

## 2011-05-01 NOTE — Telephone Encounter (Signed)
Talked with nana and he had already told pt that urine is ok- will not call him back

## 2011-05-02 LAB — IMMUNOFIXATION ELECTROPHORESIS
IgA: 455 mg/dL — ABNORMAL HIGH (ref 68–379)
IgM, Serum: 117 mg/dL (ref 41–251)
Total Protein, Serum Electrophoresis: 7.5 g/dL (ref 6.0–8.3)

## 2011-05-04 ENCOUNTER — Other Ambulatory Visit: Payer: Self-pay | Admitting: Internal Medicine

## 2011-05-04 LAB — UIFE/LIGHT CHAINS/TP QN, 24-HR UR
Albumin, U: DETECTED
Alpha 2, Urine: DETECTED — AB
Free Kappa/Lambda Ratio: 5.07 ratio (ref 2.04–10.37)
Free Lt Chn Excr Rate: 213.75 mg/d
Total Protein, Urine-Ur/day: 309 mg/d — ABNORMAL HIGH (ref 10–140)
Total Protein, Urine: 24.7 mg/dL

## 2011-05-08 ENCOUNTER — Other Ambulatory Visit: Payer: Medicare Other | Admitting: Lab

## 2011-05-08 ENCOUNTER — Ambulatory Visit: Payer: Medicare Other | Admitting: Oncology

## 2011-05-08 ENCOUNTER — Ambulatory Visit: Payer: Medicare Other

## 2011-05-15 ENCOUNTER — Ambulatory Visit
Admission: RE | Admit: 2011-05-15 | Discharge: 2011-05-15 | Disposition: A | Discharge status: Home / Self Care | Payer: Medicare Other | Source: Ambulatory Visit | Attending: Radiation Oncology | Admitting: Radiation Oncology

## 2011-05-15 ENCOUNTER — Encounter: Payer: Self-pay | Admitting: Radiation Oncology

## 2011-05-15 ENCOUNTER — Other Ambulatory Visit: Payer: Self-pay | Admitting: Internal Medicine

## 2011-05-15 ENCOUNTER — Other Ambulatory Visit: Payer: Self-pay | Admitting: *Deleted

## 2011-05-15 VITALS — BP 118/82 | HR 78 | Temp 96.9°F | Resp 18 | Wt 211.9 lb

## 2011-05-15 DIAGNOSIS — C9000 Multiple myeloma not having achieved remission: Secondary | ICD-10-CM

## 2011-05-15 NOTE — Progress Notes (Signed)
Followup note:  [name] returns today approximately 1 month following completion of palliative radiotherapy to his LS-spine (L3-sacrum) in the management of his multiple plasmacytomas/myeloma. He states that he is lower back pain is much improved and now is only one at of 10. More recently, he has seen Rodney [name] at [facility] to see if he is a candidate for bone marrow transplantation. He is apparently awaiting a PET scan for thorough staging. He canceled his last followup visit with Rodney [name], having "to work". He is not sure when he is rescheduled to see Rodney [name]. He does have worsening pain along his mid back which was not present one month ago. He saw Rodney [name] of neurosurgery last week. Of note is that he did have disease seen to involve T12 and also L1 on his staging workup/MRI scans. This was not within his previous radiation therapy field.  Physical examination: Back: There is pain described at approximately T12. There is no palpable lumbar or sacral spine discomfort. There are no significant skin changes. Extremities without edema. Neurologic examination: Lower extremity strength is excellent, deep tendon reflexes are 2+ at the patella and sensory examination is normal to light touch.  Impression: Satisfactory palliation with respect to his LS-spine disease. However, I am concerned about the possibility of symptomatic disease at T12 or L1.  Plan: I instructed Rodney [name] to reschedule his followup visit with Rodney [name] and to maintain his diagnostic workup through [facility] and Dr. Lucretia Roers. He is not scheduled for a formal followup visit with me. I would be more than happy to see him in the future should the need arise.

## 2011-05-15 NOTE — Progress Notes (Signed)
Patient presents to the clinic today accompanied by his wife for a follow up appointment with Dr. Dayton Scrape. Patient is alert and oriented to person, place, and time. No distress noted. Steady gait noted. Pleasant affect noted. Patient denies pain at this time. Patient reports that after 12-13 of being up he experience pain in his hips, shoulders, and just above the area treated in his lumbar spine. Patient reports taking Tramadol or Tylenol prn for this pain. Patient denies nausea, vomiting, headache, diarrhea, or dizziness. Patient reports that his weight is stable. Reported all findings to Dr. Dayton Scrape.

## 2011-05-17 ENCOUNTER — Telehealth: Payer: Self-pay | Admitting: Internal Medicine

## 2011-05-17 NOTE — Telephone Encounter (Signed)
[employer] needs to give an authorization # for PET Whole Body Scan. Req call back.

## 2011-05-18 ENCOUNTER — Telehealth: Payer: Self-pay | Admitting: Oncology

## 2011-05-18 ENCOUNTER — Ambulatory Visit: Payer: Medicare Other | Admitting: Lab

## 2011-05-18 ENCOUNTER — Other Ambulatory Visit: Payer: Self-pay | Admitting: Oncology

## 2011-05-18 ENCOUNTER — Ambulatory Visit (HOSPITAL_BASED_OUTPATIENT_CLINIC_OR_DEPARTMENT_OTHER): Payer: Medicare Other | Admitting: Oncology

## 2011-05-18 ENCOUNTER — Other Ambulatory Visit: Payer: Medicare Other | Admitting: Lab

## 2011-05-18 ENCOUNTER — Ambulatory Visit: Payer: Medicare Other | Admitting: Oncology

## 2011-05-18 ENCOUNTER — Ambulatory Visit (HOSPITAL_BASED_OUTPATIENT_CLINIC_OR_DEPARTMENT_OTHER): Payer: Medicare Other

## 2011-05-18 VITALS — BP 104/70 | HR 61 | Temp 97.5°F | Ht 71.0 in | Wt 215.6 lb

## 2011-05-18 DIAGNOSIS — C9000 Multiple myeloma not having achieved remission: Secondary | ICD-10-CM

## 2011-05-18 DIAGNOSIS — C9030 Solitary plasmacytoma not having achieved remission: Secondary | ICD-10-CM

## 2011-05-18 DIAGNOSIS — M549 Dorsalgia, unspecified: Secondary | ICD-10-CM

## 2011-05-18 LAB — CBC WITH DIFFERENTIAL/PLATELET
BASO%: 0.1 % (ref 0.0–2.0)
Basophils Absolute: 0 10*3/uL (ref 0.0–0.1)
EOS%: 3.1 % (ref 0.0–7.0)
HGB: 14.7 g/dL (ref 13.0–17.1)
MCH: 28 pg (ref 27.2–33.4)
MCHC: 33.6 g/dL (ref 32.0–36.0)
RDW: 18.4 % — ABNORMAL HIGH (ref 11.0–14.6)
WBC: 6.4 10*3/uL (ref 4.0–10.3)
lymph#: 0.6 10*3/uL — ABNORMAL LOW (ref 0.9–3.3)

## 2011-05-18 MED ORDER — ZOLEDRONIC ACID 4 MG/100ML IV SOLN
4.0000 mg | Freq: Once | INTRAVENOUS | Status: AC
  Administered 2011-05-18: 4 mg via INTRAVENOUS
  Filled 2011-05-18: qty 100

## 2011-05-18 MED ORDER — SODIUM CHLORIDE 0.9 % IV SOLN
Freq: Once | INTRAVENOUS | Status: AC
  Administered 2011-05-18: 10:00:00 via INTRAVENOUS

## 2011-05-18 NOTE — Progress Notes (Signed)
Referral MD Rodney [name]  [phone]    Reason for Referral: history of plasmacytoma  Chief Complaint  Patient presents with  . Multiple Myeloma  : This patient has previously been seen by dr Rodney [name].  He had been evaluated in 2010 for back pain and light chain abnormality. At that time he had a small lesion at L3 , which was felt tobe a hemangioma. He had been having back pain off and on for a number of years. Pain became worse and ultimately disabling when he was admitted to [facility] hospital. He was still able to use his legs . An MRI showed a destructive lesion through L3 causing displacement of bony fragments posteriorly. He underwent surgery for this on 01/05/11 and had  Stabilizing rods placed as well. He is in an outpatient rehab program and is doing extremely well. His pain is managed with minimal meds (robaxin, ultram and tylenol). He is  Seeing  dr Rodney [name] who is performing xrt to L3 and sacrum . He is tolerating it well, apart from mild ongoing pain. He is s/p inguinal hernia repainr by Dr Rodney [name]  In December 2012. He is on chronic coumadin for recurrent PE. The 2nd episode took place after a cholecystectomy where he was given lovenox for 2 weeks, and had a PE after the 3rd week, The patient has completed radiation therapy as gone back to work. He feels great. 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 In addition he is able to not take any pain medication until late in the afternoon. Does take occasional Tylenol or oxycodone. He feels quite well as noted. With an excellent performance status. He has been to [facility] has been seen by the transplant doctors there. There is some workup in progress forget and consideration for bone marrow transplant he is due to start Zometa today HPI:   Past Medical History  Diagnosis Date  . Pulmonary embolism X 2 , 1st after prostate surgery  . Prostate cancer 2002  . Pancreatitis Recurrent episodes prior to 2/12  . Multiple  myeloma Being assessd  . Pulmonary embolism   . Degenerative disc disease   . Anemia   . Hernia current  :  Past Surgical History  Procedure Date  . Cholecystectomy 04/06/2010  . Back surgery   :  Current outpatient prescriptions:acetaminophen (TYLENOL) 500 MG tablet, Take 1-2 tablets (500-1,000 mg total) by mouth every 6 (six) hours as needed for pain. For pain, Disp: 30 tablet, Rfl: 1;  methocarbamol (ROBAXIN) 500 MG tablet, Take 500 mg by mouth 4 (four) times daily.  , Disp: , Rfl: ;  omeprazole (PRILOSEC) 20 MG capsule, Take 20 mg by mouth 2 (two) times daily.  , Disp: , Rfl:  traMADol (ULTRAM) 50 MG tablet, Take 50 mg by mouth every 6 (six) hours as needed. Maximum dose= 8 tablets per day , Disp: , Rfl: ;  warfarin (COUMADIN) 5 MG tablet, To be titrated by phramacy, goal inr 2-3, Disp: 30 tablet, Rfl: 6;  enoxaparin (LOVENOX) 80 MG/0.8ML SOLN injection, Inject 0.8 mLs (80 mg total) into the skin daily., Disp: 0.8 mL, Rfl: 9 ferrous fumarate-b12-vitamic C-folic acid (TRINSICON / FOLTRIN) capsule, Take 1 capsule by mouth 3 (three) times daily after meals., Disp: 90 capsule, Rfl: 1:    :  No Known Allergies:  Family History  Problem Relation Age of Onset  . Lymphoma Father 16  . Cancer Father   . Clotting disorder Mother at age 95  blood clots in both grandparents and 1 uncle, 3 brothers in good health  :  History   Social History  . Marital Status: Married x 3    Spouse Name: N/A    Number of Children: 3 children ( all diabetics)  . Years of Education: N/A   Occupational History  . Not on file. Works [employer], travels extensively   Social History Main Topics  . Smoking status: Former Games developer  . Smokeless tobacco: Not on file   Comment: quit 50 yrs  . Alcohol Use: No  . Drug Use: No  . Sexually Active: Yes   Other Topics Concern  . Not on file   Social History Narrative   MarriedRegular exercise-yes  :  A comprehensive review of systems was negative  except for: Musculoskeletal: positive for back pain  Exam: Vital signs are stable  General appearance: alert, cooperative and appears stated age, back brace in place HEENT: no thrush, no adenopathy, anicteric, no thyromegaly Nodes- normal Lungs: normal CVS: normal Abdomen: normal Lt inguinal hernia Ext: normal  No results found for this basename: WBC:2,HGB:2,HCT:2,PLT:2 in the last 72 hours No results found for this basename: NA:2,K:2,CL:2,CO2:2,GLUCOSE:2,BUN:2,CREATININE:2,CALCIUM:2 in the last 72 hours  Blood smear review: n/a  Pathology:plasma cells in vertebral lesions.  No results found.  Assessment and Plan: 71 year old gentleman with history of plasmacytoma and low number of plasma cells present bone marrow aspirate and biopsy. It would appear that he has involving myeloma. We'll continue to see her monthly and give him Zometa at that time.  Rodney [name] M.D. FRCP C.

## 2011-05-18 NOTE — Telephone Encounter (Signed)
gve the pt his April 2013 appt calendar 

## 2011-05-19 ENCOUNTER — Emergency Department (HOSPITAL_COMMUNITY)
Admission: EM | Admit: 2011-05-19 | Discharge: 2011-05-19 | Disposition: A | Discharge status: Home / Self Care | Payer: Medicare Other | Attending: Emergency Medicine | Admitting: Emergency Medicine

## 2011-05-19 ENCOUNTER — Emergency Department (HOSPITAL_COMMUNITY): Payer: Medicare Other

## 2011-05-19 ENCOUNTER — Encounter (HOSPITAL_COMMUNITY): Payer: Self-pay | Admitting: Adult Health

## 2011-05-19 DIAGNOSIS — T50995A Adverse effect of other drugs, medicaments and biological substances, initial encounter: Secondary | ICD-10-CM | POA: Insufficient documentation

## 2011-05-19 DIAGNOSIS — Z79899 Other long term (current) drug therapy: Secondary | ICD-10-CM | POA: Insufficient documentation

## 2011-05-19 DIAGNOSIS — Z86718 Personal history of other venous thrombosis and embolism: Secondary | ICD-10-CM | POA: Insufficient documentation

## 2011-05-19 DIAGNOSIS — IMO0001 Reserved for inherently not codable concepts without codable children: Secondary | ICD-10-CM | POA: Insufficient documentation

## 2011-05-19 DIAGNOSIS — T50905A Adverse effect of unspecified drugs, medicaments and biological substances, initial encounter: Secondary | ICD-10-CM

## 2011-05-19 DIAGNOSIS — R509 Fever, unspecified: Secondary | ICD-10-CM

## 2011-05-19 DIAGNOSIS — C9000 Multiple myeloma not having achieved remission: Secondary | ICD-10-CM

## 2011-05-19 DIAGNOSIS — Z8546 Personal history of malignant neoplasm of prostate: Secondary | ICD-10-CM | POA: Insufficient documentation

## 2011-05-19 DIAGNOSIS — Z7901 Long term (current) use of anticoagulants: Secondary | ICD-10-CM | POA: Insufficient documentation

## 2011-05-19 LAB — URINALYSIS, ROUTINE W REFLEX MICROSCOPIC
Nitrite: NEGATIVE
Specific Gravity, Urine: 1.026 (ref 1.005–1.030)
Urobilinogen, UA: 0.2 mg/dL (ref 0.0–1.0)

## 2011-05-19 LAB — CBC
HCT: 40.6 % (ref 39.0–52.0)
MCH: 28 pg (ref 26.0–34.0)
MCV: 81.2 fL (ref 78.0–100.0)
RDW: 17.1 % — ABNORMAL HIGH (ref 11.5–15.5)
WBC: 6 10*3/uL (ref 4.0–10.5)

## 2011-05-19 LAB — COMPREHENSIVE METABOLIC PANEL
AST: 28 U/L (ref 0–37)
CO2: 22 mEq/L (ref 19–32)
Calcium: 8.8 mg/dL (ref 8.4–10.5)
Creatinine, Ser: 0.84 mg/dL (ref 0.50–1.35)
GFR calc Af Amer: >90 mL/min (ref >90)
GFR calc non Af Amer: 88 mL/min — ABNORMAL LOW (ref >90)
Glucose, Bld: 95 mg/dL (ref 70–99)
Total Protein: 6.9 g/dL (ref 6.0–8.3)

## 2011-05-19 LAB — DIFFERENTIAL
Basophils Absolute: 0.1 10*3/uL (ref 0.0–0.1)
Eosinophils Absolute: 0 10*3/uL (ref 0.0–0.7)
Eosinophils Relative: 0 % (ref 0–5)
Lymphocytes Relative: 6 % — ABNORMAL LOW (ref 12–46)
Monocytes Absolute: 0.4 10*3/uL (ref 0.1–1.0)

## 2011-05-19 LAB — URINE MICROSCOPIC-ADD ON: Urine-Other: NONE SEEN

## 2011-05-19 MED ORDER — OXYCODONE-ACETAMINOPHEN 5-325 MG PO TABS
2.0000 | ORAL_TABLET | Freq: Once | ORAL | Status: AC
  Administered 2011-05-19: 2 via ORAL
  Filled 2011-05-19: qty 2

## 2011-05-19 MED ORDER — OXYCODONE-ACETAMINOPHEN 7.5-325 MG PO TABS: 1.0000 | ORAL_TABLET | ORAL | Status: AC | PRN

## 2011-05-19 NOTE — ED Notes (Signed)
Urinal provided, pt aware we need urine sample

## 2011-05-19 NOTE — ED Provider Notes (Addendum)
History     CSN: [id number]  Arrival date & time 05/19/11  1554   First MD Initiated Contact with Patient 05/19/11 1704      Chief Complaint  Patient presents with  . Influenza    (Consider location/radiation/quality/duration/timing/severity/associated sxs/prior treatment) HPI Began having body aches yesterday morning, yesterday had radiation treatment for multiple myeloma.  Fever began last might after radiation. Denies SOB, c/o headache and nausea.   Past Medical History  Diagnosis Date  . Pulmonary embolism   . Pancreatitis   . Pulmonary embolism   . Complication of anesthesia 02-16-11    Pt. speaks of awakening during the surgery from back  surgery  . Shortness of breath 02-16-11    hx. Pulmonary emboli x2 (9 yrs/ 3'12 -last)  . Anemia 02-16-11    01-05-11- post surgery-Transfusions x 4 units  . Prostate cancer   . Multiple myeloma 02-16-11    suspected tumor  left sacral  . Degenerative disc disease 02-16-11    01-05-11 L3 fusion done  . Hernia 02-16-11    left inguinal hernia at present  . Multiple myeloma     Past Surgical History  Procedure Date  . Back surgery 02-16-11     01-05-11 Lumbar surgery L3(complicated by loss of blood volume)/ 01-09-11 then Lumbar fusion done with retained  hardware   . Cholecystectomy 04/06/2010    laparoscopic-inflammation with stones  . Inguinal hernia repair 02/20/2011    Procedure: HERNIA REPAIR INGUINAL ADULT;  Surgeon: Velora Heckler, MD;  Location: [facility];  Service: General;  Laterality: Left;  Repair Left Inguinal Hernia with Mesh  . Hernia repair 02/20/11    Inguinal hernia repair w/mesh    Family History  Problem Relation Age of Onset  . Lymphoma Father 60  . Cancer Father   . Clotting disorder Mother     blood clots    History  Substance Use Topics  . Smoking status: Former Smoker    Quit date: 02/15/1969  . Smokeless tobacco: Never Used   Comment: quit 50 yrs  . Alcohol Use: No      Review of Systems  All  other systems reviewed and are negative.    Allergies  Review of patient's allergies indicates no known allergies.  Home Medications   Current Outpatient Rx  Name Route Sig Dispense Refill  . ACETAMINOPHEN 500 MG PO TABS Oral Take 1-2 tablets (500-1,000 mg total) by mouth every 6 (six) hours as needed for pain. For pain 30 tablet 1  . CALCIUM CARBONATE ANTACID 500 MG PO CHEW Oral Chew 1 tablet by mouth every 2 (two) hours as needed. HEART BURN      . CALTRATE 600 PLUS-VIT D PO Oral Take 1 tablet by mouth every other day.      . METHYLCELLULOSE 1 % OP SOLN Both Eyes Place 2 drops into both eyes 2 (two) times daily as needed. DRY EYES     . THERA M PLUS PO TABS Oral Take 1 tablet by mouth every other day.      Marland Kitchen OMEPRAZOLE 20 MG PO CPDR Oral Take 20 mg by mouth 2 (two) times daily as needed. HEART BURN     . TRAMADOL HCL 50 MG PO TABS Oral Take 50 mg by mouth every 6 (six) hours as needed. For pain    . WARFARIN SODIUM 5 MG PO TABS  TAKE ONE TABLET BY MOUTH DAILY. 90 tablet 0    BP 118/66  Pulse 84  Temp(Src)  100.4 F (38 C) (Oral)  Resp 18  Ht 5\' 10"  (1.778 m)  Wt 211 lb (95.709 kg)  BMI 30.28 kg/m2  SpO2 95%  Physical Exam  Nursing note and vitals reviewed. Constitutional: He is oriented to person, place, and time. He appears well-developed and well-nourished. No distress.  HENT:  Head: Normocephalic and atraumatic.  Eyes: Pupils are equal, round, and reactive to light.  Neck: Normal range of motion and full passive range of motion without pain. Neck supple.  Cardiovascular: Normal rate and intact distal pulses.   Pulmonary/Chest: No respiratory distress. He has no wheezes. He has no rales.  Abdominal: Normal appearance. He exhibits no distension. There is no tenderness. There is no rebound and no guarding.  Musculoskeletal: Normal range of motion.  Neurological: He is alert and oriented to person, place, and time. No cranial nerve deficit.  Skin: Skin is warm and dry. No  rash noted.  Psychiatric: He has a normal mood and affect. His behavior is normal.    ED Course  Procedures (including critical care time) Scheduled Meds:   . oxyCODONE-acetaminophen  2 tablet Oral Once   Continuous Infusions:  PRN Meds:.  Labs Reviewed  CBC - Abnormal; Notable for the following:    RDW 17.1 (*)    All other components within normal limits  DIFFERENTIAL - Abnormal; Notable for the following:    Neutrophils Relative 88 (*)    Lymphocytes Relative 6 (*)    Lymphs Abs 0.3 (*)    All other components within normal limits  COMPREHENSIVE METABOLIC PANEL - Abnormal; Notable for the following:    GFR calc non Af Amer 88 (*)    All other components within normal limits  URINALYSIS, ROUTINE W REFLEX MICROSCOPIC - Abnormal; Notable for the following:    Protein, ur 30 (*)    All other components within normal limits  URINE MICROSCOPIC-ADD ON  CULTURE, BLOOD (ROUTINE X 2)  CULTURE, BLOOD (ROUTINE X 2)   Dg Chest 2 View  05/19/2011  *RADIOLOGY REPORT*  Clinical Data: Fever and cough  CHEST - 2 VIEW  Comparison: Chest radiograph 01/12/2011  Findings: Normal cardiac silhouette.  There is mild atelectasis of the lung bases which is similar to prior.  No focal consolidation. No pneumothorax. Degenerative osteophytosis of the thoracic spine.  IMPRESSION:  1.  No acute findings. 2.  Mild basilar atelectasis.  Original Report Authenticated By: Genevive Bi, M.D.     1. Adverse effects of medication   2. Multiple myeloma, without mention of having achieved remission   3. Fever       MDM   Patient received injection of medication (Zometa) yesterday  Review of side effects indicated fever, headache, and influenza symptoms are considered common side effects  Patients wbc seems adequate and other than symptoms he has no other complaints  Will dc home wil instructions to return if no better or worsening of symptoms         Nelia Shi, MD 05/20/11 4098  Nelia Shi, MD 05/20/11 1000

## 2011-05-19 NOTE — ED Notes (Signed)
Began having body aches yesterday morning, yesterday had radiation treatment for multiple myeloma.  Fever began last might after radiation. Denies SOB, c/o headache and nausea.

## 2011-05-21 ENCOUNTER — Telehealth: Payer: Self-pay | Admitting: Family Medicine

## 2011-05-21 ENCOUNTER — Other Ambulatory Visit: Payer: Self-pay | Admitting: Internal Medicine

## 2011-05-21 NOTE — Telephone Encounter (Signed)
[facility] Triage Call Report Triage Record Num: [id number] Operator: Albertine Grates Patient Name: Rodney [name] Call Date & Time: 05/19/2011 3:02:25PM Patient Phone: [phone] PCP: Darryll Capers Patient Gender: Male PCP Fax : [phone] Patient DOB: 30-Jan-1941 [facility] Name: [facility] Reason for Call: Caller: Matthias/Patient; PCP: Darryll Capers; CB#: [phone]; Call regarding Flulike Sx; Had radiation 3-15. Has developed flu like symptoms 3-16. Temp 100.6 3-15 and 99.6 3-16. Has been taking Tylenol q5hrs. Has "bad" headache.Advised ED. Protocol(s) Used: Flu-Like Symptoms Recommended Outcome per Protocol: See ED Immediately Reason for Outcome: New onset of stiff neck causing pain with forward head movement, severe generalized headache, fever, or altered mental status Care Advice: ~ 03/

## 2011-05-22 ENCOUNTER — Telehealth: Payer: Self-pay | Admitting: *Deleted

## 2011-05-22 LAB — COMPREHENSIVE METABOLIC PANEL
ALT: 20 U/L (ref 0–53)
AST: 20 U/L (ref 0–37)
Albumin: 4.3 g/dL (ref 3.5–5.2)
Calcium: 8.9 mg/dL (ref 8.4–10.5)
Chloride: 106 mEq/L (ref 96–112)
Potassium: 4 mEq/L (ref 3.5–5.3)

## 2011-05-22 LAB — KAPPA/LAMBDA LIGHT CHAINS
Kappa free light chain: 1.67 mg/dL (ref 0.33–1.94)
Lambda Free Lght Chn: 14.2 mg/dL — ABNORMAL HIGH (ref 0.57–2.63)

## 2011-05-22 LAB — IMMUNOFIXATION ELECTROPHORESIS
IgA: 418 mg/dL — ABNORMAL HIGH (ref 68–379)
IgG (Immunoglobin G), Serum: 1290 mg/dL (ref 650–1600)
Total Protein, Serum Electrophoresis: 6.8 g/dL (ref 6.0–8.3)

## 2011-05-22 NOTE — Telephone Encounter (Signed)
Friday EVENING PT. HAD CHILLS,FEVER OF 100.6 AND JOINT PAIN. HE CALLED HIS PRIMARY CARE PHYSICIAN WHO SENT HIM TO THE EMERGENCY ROOM. AFTER A CHEST XRAY AND LAB WORK IT WAS DETERMINED THAT PT. DID NOT HAVE AN INFECTION BUT A REACTION TO THE ZOMETA. PT. WAS SENT HOME. HE USED TYLENOL ES FOR HIS FEVER AND PERCOCET FOR THE JOINT PAIN. ON Sunday PT'S WIFE CALLED THE ON CALL PHYSICIAN AT THIS OFFICE TO INFORM HIM OF THE REACTION TO THE ZOMETA. NO NEW INSTRUCTIONS GIVEN TO PT. NO PROBLEMS OR QUESTIONS AT THIS TIME. PT. WILL CALL THIS OFFICE IF THE NEED ARISES. NEXT APPOINTMENT 06/15/11 WITH CHRIS SCHERER,PA. THIS NOTE TO CHRIS SCHERER'S DESK.

## 2011-05-25 LAB — CULTURE, BLOOD (ROUTINE X 2): Culture: NO GROWTH

## 2011-05-28 ENCOUNTER — Encounter (HOSPITAL_COMMUNITY)
Admission: RE | Admit: 2011-05-28 | Discharge: 2011-05-28 | Disposition: A | Discharge status: Home / Self Care | Payer: Medicare Other | Source: Ambulatory Visit | Attending: Internal Medicine | Admitting: Internal Medicine

## 2011-05-28 DIAGNOSIS — C9000 Multiple myeloma not having achieved remission: Secondary | ICD-10-CM | POA: Insufficient documentation

## 2011-05-28 DIAGNOSIS — I2510 Atherosclerotic heart disease of native coronary artery without angina pectoris: Secondary | ICD-10-CM | POA: Insufficient documentation

## 2011-05-28 DIAGNOSIS — R911 Solitary pulmonary nodule: Secondary | ICD-10-CM | POA: Insufficient documentation

## 2011-05-28 LAB — GLUCOSE, CAPILLARY: Glucose-Capillary: 79 mg/dL (ref 70–99)

## 2011-05-28 MED ORDER — FLUDEOXYGLUCOSE F - 18 (FDG) INJECTION
14.5000 | Freq: Once | INTRAVENOUS | Status: AC | PRN
  Administered 2011-05-28: 14.5 via INTRAVENOUS

## 2011-06-04 ENCOUNTER — Ambulatory Visit (INDEPENDENT_AMBULATORY_CARE_PROVIDER_SITE_OTHER): Payer: Medicare Other | Admitting: Internal Medicine

## 2011-06-04 ENCOUNTER — Encounter: Payer: Self-pay | Admitting: Internal Medicine

## 2011-06-04 DIAGNOSIS — Z86718 Personal history of other venous thrombosis and embolism: Secondary | ICD-10-CM

## 2011-06-04 DIAGNOSIS — R942 Abnormal results of pulmonary function studies: Secondary | ICD-10-CM

## 2011-06-04 DIAGNOSIS — R079 Chest pain, unspecified: Secondary | ICD-10-CM

## 2011-06-04 DIAGNOSIS — R948 Abnormal results of function studies of other organs and systems: Secondary | ICD-10-CM

## 2011-06-04 LAB — POCT INR: INR: 2.7

## 2011-06-04 NOTE — Progress Notes (Signed)
Subjective:    Patient ID: Rodney [name], male    DOB: 25-Mar-1940, 71 y.o.   MRN: [id number]  HPI  Patient presents today to discuss his consult at [facility].  After his PET scan there are profound chemotherapy we discussed the different regimens and agree with the consultant from [facility] that the lower dose chemotherapy would be optimal to see if he responds first if he responds to this chemotherapy regimen he would then be followed long term if he does not respond then bone marrow is an option.  He also presents for followup of the PET scan the PET scan showed increased calcium scoring of the coronary arteries the disease and he does have risk factors of age hyperlipidemia history of a clotting disorder associated with his multiple myeloma given these risk factors a stress Myoview is recommended before having chemotherapy since he is unable to walk uphill do to his back pain we will change this to an adenosine Myoview  Review of Systems  Constitutional: Negative for fever and fatigue.  HENT: Negative for hearing loss, congestion, neck pain and postnasal drip.   Eyes: Negative for discharge, redness and visual disturbance.  Respiratory: Negative for cough, shortness of breath and wheezing.   Cardiovascular: Negative for leg swelling.  Gastrointestinal: Negative for abdominal pain, constipation and abdominal distention.  Genitourinary: Negative for urgency and frequency.  Musculoskeletal: Negative for joint swelling and arthralgias.  Skin: Negative for color change and rash.  Neurological: Negative for weakness and light-headedness.  Hematological: Negative for adenopathy.  Psychiatric/Behavioral: Negative for behavioral problems.       Past Medical History  Diagnosis Date  . Pulmonary embolism   . Pancreatitis   . Pulmonary embolism   . Complication of anesthesia 02-16-11    Pt. speaks of awakening during the surgery from back  surgery  . Shortness of breath 02-16-11     hx. Pulmonary emboli x2 (9 yrs/ 3'12 -last)  . Anemia 02-16-11    01-05-11- post surgery-Transfusions x 4 units  . Prostate cancer   . Multiple myeloma 02-16-11    suspected tumor  left sacral  . Degenerative disc disease 02-16-11    01-05-11 L3 fusion done  . Hernia 02-16-11    left inguinal hernia at present  . Multiple myeloma     History   Social History  . Marital Status: Married    Spouse Name: N/A    Number of Children: N/A  . Years of Education: N/A   Occupational History  . Not on file.   Social History Main Topics  . Smoking status: Former Smoker    Quit date: 02/15/1969  . Smokeless tobacco: Never Used   Comment: quit 50 yrs  . Alcohol Use: No  . Drug Use: No  . Sexually Active: Yes   Other Topics Concern  . Not on file   Social History Narrative   MarriedRegular exercise-yes    Past Surgical History  Procedure Date  . Back surgery 02-16-11     01-05-11 Lumbar surgery L3(complicated by loss of blood volume)/ 01-09-11 then Lumbar fusion done with retained  hardware   . Cholecystectomy 04/06/2010    laparoscopic-inflammation with stones  . Inguinal hernia repair 02/20/2011    Procedure: HERNIA REPAIR INGUINAL ADULT;  Surgeon: Velora Heckler, MD;  Location: [facility];  Service: General;  Laterality: Left;  Repair Left Inguinal Hernia with Mesh  . Hernia repair 02/20/11    Inguinal hernia repair w/mesh    Family  History  Problem Relation Age of Onset  . Lymphoma Father 71  . Cancer Father   . Clotting disorder Mother     blood clots    Allergies  Allergen Reactions  . Zometa (Zoledronic Acid)     The patient had a flulike response to this medication    Current Outpatient Prescriptions on File Prior to Visit  Medication Sig Dispense Refill  . acetaminophen (TYLENOL) 500 MG tablet Take 1-2 tablets (500-1,000 mg total) by mouth every 6 (six) hours as needed for pain. For pain  30 tablet  1  . calcium carbonate (TUMS - DOSED IN MG ELEMENTAL CALCIUM) 500  MG chewable tablet Chew 1 tablet by mouth every 2 (two) hours as needed. HEART BURN        . Calcium-Vitamin D (CALTRATE 600 PLUS-VIT D PO) Take 1 tablet by mouth every other day.        . methylcellulose (ARTIFICIAL TEARS) 1 % ophthalmic solution Place 2 drops into both eyes 2 (two) times daily as needed. DRY EYES       . Multiple Vitamins-Minerals (MULTIVITAMINS THER. W/MINERALS) TABS Take 1 tablet by mouth every other day.        Marland Kitchen omeprazole (PRILOSEC) 20 MG capsule Take 20 mg by mouth 2 (two) times daily as needed. HEART BURN       . traMADol (ULTRAM) 50 MG tablet Take 50 mg by mouth every 6 (six) hours as needed. For pain      . traMADol (ULTRAM) 50 MG tablet TAKE ONE TABLET BY MOUTH EVERY 8 HOURS AS NEEDED FOR PAIN  60 tablet  3  . warfarin (COUMADIN) 5 MG tablet TAKE ONE TABLET BY MOUTH DAILY.  90 tablet  0    BP 132/80  Pulse 72  Temp 98.6 F (37 C)  Resp 16  Ht 5\' 10"  (1.778 m)  Wt 218 lb (98.884 kg)  BMI 31.28 kg/m2    Objective:   Physical Exam  Constitutional: He appears well-developed and well-nourished.  HENT:  Head: Normocephalic and atraumatic.  Eyes: Conjunctivae are normal. Pupils are equal, round, and reactive to light.  Neck: Normal range of motion. Neck supple.  Cardiovascular: Normal rate and regular rhythm.   Pulmonary/Chest: Effort normal and breath sounds normal.  Abdominal: Soft. Bowel sounds are normal.          Assessment & Plan:  Due to the increased calcium scoring of his coronary artery disease would recommend a stress Myoview if the patient is unable to walk on the treadmill but should be converted to an adenosine Myoview on warfarin adenosine Myoview was placed today.  We discussed the patient's multiple myeloma and therapeutic options since he has completed radiation we will do with the consultant that he should proceed with low-dose chemotherapy at this time

## 2011-06-04 NOTE — Patient Instructions (Addendum)
The patient is instructed to continue all medications as prescribed. Schedule followup with check out clerk upon leaving the clinic     Latest dosing instructions   Total Sun Mon Tue Wed Thu Fri Sat   35 5 mg 5 mg 5 mg 5 mg 5 mg 5 mg 5 mg    (5 mg1) (5 mg1) (5 mg1) (5 mg1) (5 mg1) (5 mg1) (5 mg1)        

## 2011-06-05 ENCOUNTER — Other Ambulatory Visit: Payer: Self-pay | Admitting: Oncology

## 2011-06-05 DIAGNOSIS — C9000 Multiple myeloma not having achieved remission: Secondary | ICD-10-CM

## 2011-06-06 ENCOUNTER — Encounter: Payer: Self-pay | Admitting: *Deleted

## 2011-06-06 NOTE — Progress Notes (Unsigned)
Spoke with Estill Bakes, RN from [facility] regarding revlimid. Pt has been pre-approved for payment assistance through [employer] but has not been registered. Pt sees MD 06/17/11. Will need to fax  RX to #[phone] to [employer].

## 2011-06-12 ENCOUNTER — Ambulatory Visit (HOSPITAL_COMMUNITY): Payer: Medicare Other | Attending: Internal Medicine | Admitting: Radiology

## 2011-06-12 DIAGNOSIS — R079 Chest pain, unspecified: Secondary | ICD-10-CM | POA: Insufficient documentation

## 2011-06-12 DIAGNOSIS — E785 Hyperlipidemia, unspecified: Secondary | ICD-10-CM | POA: Insufficient documentation

## 2011-06-12 DIAGNOSIS — Z8249 Family history of ischemic heart disease and other diseases of the circulatory system: Secondary | ICD-10-CM | POA: Insufficient documentation

## 2011-06-12 DIAGNOSIS — Z87891 Personal history of nicotine dependence: Secondary | ICD-10-CM | POA: Insufficient documentation

## 2011-06-12 DIAGNOSIS — I4510 Unspecified right bundle-branch block: Secondary | ICD-10-CM | POA: Insufficient documentation

## 2011-06-12 DIAGNOSIS — R9431 Abnormal electrocardiogram [ECG] [EKG]: Secondary | ICD-10-CM

## 2011-06-12 DIAGNOSIS — R948 Abnormal results of function studies of other organs and systems: Secondary | ICD-10-CM

## 2011-06-12 MED ORDER — REGADENOSON 0.4 MG/5ML IV SOLN
0.4000 mg | Freq: Once | INTRAVENOUS | Status: AC
  Administered 2011-06-12: 0.4 mg via INTRAVENOUS

## 2011-06-12 MED ORDER — TECHNETIUM TC 99M TETROFOSMIN IV KIT
10.7000 | PACK | Freq: Once | INTRAVENOUS | Status: AC | PRN
  Administered 2011-06-12: 11 via INTRAVENOUS

## 2011-06-12 MED ORDER — TECHNETIUM TC 99M TETROFOSMIN IV KIT
33.0000 | PACK | Freq: Once | INTRAVENOUS | Status: AC | PRN
  Administered 2011-06-12: 33 via INTRAVENOUS

## 2011-06-12 NOTE — Progress Notes (Signed)
[facility] SITE 3 NUCLEAR MED [address] [place] [address] [phone]  Cardiology Nuclear Med Study  Rodney [name] is a 71 y.o. male     MRN : [id number]     DOB: 03/15/1940  Procedure Date: 06/12/2011  Nuclear Med Background Indication for Stress Test:  Evaluation for Ischemia History: H/O multiple myolomas, 03/35/02 MPS: Stress Only: NL EF: 63%, 07/25/10 ECHO: EF:55-60% Cardiac Risk Factors: Family History - CAD, History of Smoking, Lipids and RBBB  Symptoms:  none   Nuclear Pre-Procedure Caffeine/Decaff Intake:  None NPO After: 10:00pm   Lungs:  clear O2 Sat: 98% on room air. IV 0.9% NS with Angio Cath:  20g  IV Site: R Antecubital  IV Started by:  Stanton Kidney, EMT-P  Chest Size (in):  42 Cup Size: n/a  Height: 5\' 11"  (1.803 m)  Weight:  218 lb (98.884 kg)  BMI:  Body mass index is 30.40 kg/(m^2). Tech Comments:  NA    Nuclear Med Study 1 or 2 day study: 1 day  Stress Test Type:  Treadmill/Lexiscan  Reading MD: Charlton Haws, MD  Order Authorizing Provider:  J.Jenkins MD  Resting Radionuclide: Technetium 71m Tetrofosmin  Resting Radionuclide Dose: 10.7 mCi   Stress Radionuclide:  Technetium 25m Tetrofosmin  Stress Radionuclide Dose: 33.0 mCi           Stress Protocol Rest HR: 65 Stress HR: 94  Rest BP: 131/90 Stress BP: 139/85  Exercise Time (min): n/a METS: n/a   Predicted Max HR: 152 bpm % Max HR: 61.84 bpm Rate Pressure Product: 21308   Dose of Adenosine (mg):  n/a Dose of Lexiscan: n/a mg  Dose of Atropine (mg): n/a Dose of Dobutamine: n/a mcg/kg/min (at max HR)  Stress Test Technologist: Milana Na, EMT-P  Nuclear Technologist:  Domenic Polite, CNMT     Rest Procedure:  Myocardial perfusion imaging was performed at rest 45 minutes following the intravenous administration of Technetium 49m Tetrofosmin. Rest ECG: NSR-RBBB  Stress Procedure:  The patient received IV Lexiscan 0.4 mg over 15-seconds with concurrent low  level exercise and then Technetium 69m Tetrofosmin was injected at 30-seconds while the patient continued walking one more minute. There were no significant changes, + lt. Headed, and feels strange with Lexiscan. Quantitative spect images were obtained after a 45-minute delay. Stress ECG: No significant change from baseline ECG  QPS Raw Data Images:  Patient motion noted. Stress Images:  Decrease in apex Rest Images:  Normal homogeneous uptake in all areas of the myocardium. Subtraction (SDS):  These findings are consistent with ischemia. Transient Ischemic Dilatation (Normal <1.22):  1.03 Lung/Heart Ratio (Normal <0.45):  0.37  Quantitative Gated Spect Images QGS EDV:  116 ml QGS ESV:  44 ml  Impression Exercise Capacity:  Lexiscan with low level exercise. BP Response:  Normal blood pressure response. Clinical Symptoms:  No chest pain. ECG Impression:  Baseline RBBB with lateral T wave changes Comparison with Prior Nuclear Study: No images to compare  Overall Impression:  Low risk stress nuclear study. Small area of mild apical ischemia  LV Ejection Fraction: 62%.  LV Wall Motion:  NL LV Function; NL Wall Motion   Charlton Haws

## 2011-06-15 ENCOUNTER — Ambulatory Visit (HOSPITAL_BASED_OUTPATIENT_CLINIC_OR_DEPARTMENT_OTHER): Payer: Medicare Other

## 2011-06-15 ENCOUNTER — Ambulatory Visit: Payer: Medicare Other | Admitting: Physician Assistant

## 2011-06-15 ENCOUNTER — Other Ambulatory Visit (HOSPITAL_BASED_OUTPATIENT_CLINIC_OR_DEPARTMENT_OTHER): Payer: Medicare Other | Admitting: Lab

## 2011-06-15 ENCOUNTER — Other Ambulatory Visit: Payer: Self-pay | Admitting: *Deleted

## 2011-06-15 ENCOUNTER — Other Ambulatory Visit: Payer: Medicare Other | Admitting: Lab

## 2011-06-15 ENCOUNTER — Ambulatory Visit (HOSPITAL_BASED_OUTPATIENT_CLINIC_OR_DEPARTMENT_OTHER): Payer: Medicare Other | Admitting: Physician Assistant

## 2011-06-15 VITALS — BP 117/74 | HR 69 | Temp 98.2°F | Ht 71.0 in | Wt 221.0 lb

## 2011-06-15 DIAGNOSIS — C9000 Multiple myeloma not having achieved remission: Secondary | ICD-10-CM

## 2011-06-15 DIAGNOSIS — Z86711 Personal history of pulmonary embolism: Secondary | ICD-10-CM

## 2011-06-15 LAB — CBC WITH DIFFERENTIAL/PLATELET
BASO%: 1.3 % (ref 0.0–2.0)
EOS%: 3.2 % (ref 0.0–7.0)
MCH: 28.6 pg (ref 27.2–33.4)
MCHC: 34.1 g/dL (ref 32.0–36.0)
NEUT%: 66.4 % (ref 39.0–75.0)
RDW: 17.6 % — ABNORMAL HIGH (ref 11.0–14.6)
lymph#: 1.1 10*3/uL (ref 0.9–3.3)

## 2011-06-15 MED ORDER — SODIUM CHLORIDE 0.9 % IV SOLN: Freq: Once | INTRAVENOUS | Status: DC

## 2011-06-15 MED ORDER — DEXAMETHASONE 4 MG PO TABS: 4.0000 mg | ORAL_TABLET | Freq: Every day | ORAL | Status: AC

## 2011-06-15 MED ORDER — ZOLEDRONIC ACID 4 MG/100ML IV SOLN
4.0000 mg | Freq: Once | INTRAVENOUS | Status: AC
  Administered 2011-06-15: 4 mg via INTRAVENOUS
  Filled 2011-06-15: qty 100

## 2011-06-15 NOTE — Progress Notes (Signed)
Patient had flu like symptoms after infusion of zometa. Zometa ok by MD.

## 2011-06-15 NOTE — Telephone Encounter (Signed)
S/w pt confirming appts for 4/26 and 5/10.

## 2011-06-18 NOTE — Progress Notes (Signed)
Hematology and Oncology Follow Up Visit  Rodney [name] [id number] Jun 28, 1940 71 y.o. 06/18/2011    HPI: Rodney [name] is a 71 year old [place] and gentleman with stage I free lambda light chain multiple myeloma diagnosed 01/05/2011 after long-standing history of plasmacytoma. Due for his next monthly dose of Zometa and to initiate to drug induction therapy as outlined by Dr. Dorothea Ogle at [facility].  Interim History:   Rodney [name] is seen today with his wife in accompaniment prior to his next monthly dose of Zometa, and discuss initiation of to drug induction as outlined by Dr.Vorhees at [facility] during consultation on 06/06/2011. Overall, Rodney [name] is feeling well today, but is very reluctant to undergo his next dose of Zometa since he did experience flu like symptoms following his first dose. In fact so, [facility] with body aches, pains, nausea. Initially he thought he "had the flu". Influenza was ruled out, and he was reassured that his symptomatology was more secondary to the Zometa which he had received earlier. Today he feels well, denying fevers, chills, or night sweats. No nausea, emesis, diarrhea or constipation issues. He is anxious to initiate therapy.   A  detailed review of systems is otherwise noncontributory as noted below.  Review of Systems: Constitutional:  no weight loss, fever, night sweats and feels well Eyes: no complaints ENT: no complaints Cardiovascular: no chest pain or dyspnea on exertion Respiratory: no cough, shortness of breath, or wheezing Neurological: no TIA or stroke symptoms Dermatological: negative Gastrointestinal: no abdominal pain, change in bowel habits, or black or bloody stools Genito-Urinary: no dysuria, trouble voiding, or hematuria Hematological and Lymphatic: negative Breast: negative Musculoskeletal: positive for - joint pain following first dose of monthly  Zometa. Remaining ROS negative.   Medications:   I have reviewed the patient's current medications.  Current Outpatient Prescriptions  Medication Sig Dispense Refill  . acetaminophen (TYLENOL) 500 MG tablet Take 1-2 tablets (500-1,000 mg total) by mouth every 6 (six) hours as needed for pain. For pain  30 tablet  1  . calcium carbonate (TUMS - DOSED IN MG ELEMENTAL CALCIUM) 500 MG chewable tablet Chew 1 tablet by mouth every 2 (two) hours as needed. HEART BURN        . Calcium-Vitamin D (CALTRATE 600 PLUS-VIT D PO) Take 1 tablet by mouth every other day.        Marland Kitchen dexamethasone (DECADRON) 4 MG tablet Take 1 tablet (4 mg total) by mouth daily.  15 tablet  3  . methylcellulose (ARTIFICIAL TEARS) 1 % ophthalmic solution Place 2 drops into both eyes 2 (two) times daily as needed. DRY EYES       . Multiple Vitamins-Minerals (MULTIVITAMINS THER. W/MINERALS) TABS Take 1 tablet by mouth every other day.        Marland Kitchen omeprazole (PRILOSEC) 20 MG capsule Take 20 mg by mouth 2 (two) times daily as needed. HEART BURN       . traMADol (ULTRAM) 50 MG tablet Take 50 mg by mouth every 6 (six) hours as needed. For pain      . traMADol (ULTRAM) 50 MG tablet TAKE ONE TABLET BY MOUTH EVERY 8 HOURS AS NEEDED FOR PAIN  60 tablet  3  . warfarin (COUMADIN) 5 MG tablet TAKE ONE TABLET BY MOUTH DAILY.  90 tablet  0    Allergies:  Allergies  Allergen Reactions  . Zometa (Zoledronic Acid)     The patient had  a flulike response to this medication    Physical Exam: Filed Vitals:   06/15/11 1330  BP: 117/74  Pulse: 69  Temp: 98.2 F (36.8 C)    Body mass index is 30.82 kg/(m^2). Weight: 221 lbs. HEENT:  Sclerae anicteric, conjunctivae pink.  Oropharynx clear.  No mucositis or candidiasis.   Nodes:  No cervical, supraclavicular, or axillary lymphadenopathy palpated.  Lungs:  Clear to auscultation bilaterally.  No crackles, rhonchi, or wheezes.   Heart:  Regular rate and rhythm.   Abdomen:  Soft, nontender.   Positive bowel sounds.  No organomegaly or masses palpated.   Musculoskeletal:  No focal spinal tenderness to gentle palpation.  Extremities:  Benign.  No peripheral edema or cyanosis.   Skin:  Benign.   Neuro:  Nonfocal, alert and oriented x 3.   Lab Results: Lab Results  Component Value Date   WBC 6.8 06/15/2011   HGB 14.2 06/15/2011   HCT 41.6 06/15/2011   MCV 83.9 06/15/2011   PLT 245 06/15/2011   NEUTROABS 4.5 06/15/2011     Chemistry      Component Value Date/Time   NA 140 06/15/2011 1232   K 4.0 06/15/2011 1232   CL 109 06/15/2011 1232   CO2 22 06/15/2011 1232   BUN 15 06/15/2011 1232   CREATININE 0.87 06/15/2011 1232   CREATININE 0.91 01/01/2011 1630      Component Value Date/Time   CALCIUM 8.9 06/15/2011 1232   ALKPHOS 106 06/15/2011 1232   AST 21 06/15/2011 1232   ALT 17 06/15/2011 1232   BILITOT 0.3 06/15/2011 1232      No results found for this basename: LABCA2    Radiological Studies: Dg Chest 2 View 05/19/2011  *RADIOLOGY REPORT*  Clinical Data: Fever and cough  CHEST - 2 VIEW  Comparison: Chest radiograph 01/12/2011  Findings: Normal cardiac silhouette.  There is mild atelectasis of the lung bases which is similar to prior.  No focal consolidation. No pneumothorax. Degenerative osteophytosis of the thoracic spine.  IMPRESSION:  1.  No acute findings. 2.  Mild basilar atelectasis.  Original Report Authenticated By: Genevive Bi, M.D.   Nm Pet Image Restag (ps) Skull Base To Thigh 05/28/2011  *RADIOLOGY REPORT*  Clinical Data: Subsequent treatment strategy for multiple myeloma.  NUCLEAR MEDICINE PET CT RESTAGING (PS) SKULL BASE TO THIGH  Technique:  14.5 mCi F-18 FDG was injected intravenously via the right AC.  Full-ring PET imaging was performed from the skull base through the mid-thighs 60  minutes after injection.  CT data was obtained and used for attenuation correction and anatomic localization only.  (This was not acquired as a diagnostic CT examination.)  Fasting  Blood Glucose:  79  Patient Weight:  210 pounds.  Comparison: Chest CT 07/24/2010.  CT of abdomen and pelvis 12/31/2010.  Findings:  Skull Base and Neck:  No abnormal hypermetabolic activity. No abnormal soft tissue masses or lymphadenopathy.  There is a lytic lesion in the spinous process of C7, without any associated hypermetabolic activity.  Thorax:  No abnormal hypermetabolic activity. 4 mm nodule in the lateral segment of the right middle lobe (image 107 of series 2). There are no other larger more suspicious appearing pulmonary nodules or masses identified.  Heart size is mildly enlarged. There is no significant pericardial fluid, thickening or pericardial calcification.  There is atherosclerosis of the thoracic aorta, the great vessels of the mediastinum and the coronary arteries, including calcified atherosclerotic plaque in the left main, left anterior descending  and right coronary arteries. No pathologically enlarged mediastinal or hilar lymph nodes. Calcified low right paratracheal lymph nodes incidentally noted.   A small lytic lesion is noted in the lateral aspect of the left clavicle (image 56 of series 2), without any associated hypermetabolic activity on the PET portion of the examination.  Abdomen/Pelvis: The unenhanced appearance of the liver, pancreas, bilateral adrenal glands and bilateral kidneys is unremarkable. Mild bilateral perinephric stranding is noted, and is nonspecific (occasionally associated with renal insufficiency).  The patient is status post cholecystectomy.  Calcifications within the spleen are consistent with calcified granulomas.  No ascites or pneumoperitoneum and no pathologic distension of bowel.  No definite pathologic adenopathy identified within the abdomen or pelvis.  Postoperative changes of pelvic lymph node dissection are noted (particularly on the right).  Compared to the prior lumbar spine CT scan dated 12/31/2010, there has been interval placement of a bone graft  within the vertebral body at L3, and fusion from L2-L4.  Hypermetabolic activity is noted within L3 (SUVmax = 4.7), as well as the posterior paraspinal soft tissues (SUVmax = 4.6).  There continues to be a large lytic lesion in L3, with little incorporation of the bone graft.  A large lytic lesion is again noted in the left side of the sacrum, demonstrating hypermetabolic activity (SUVmax = 4.8).  Upper Thighs:  No abnormal hypermetabolic activity. No abnormal soft tissue masses or lymphadenopathy.  IMPRESSION: 1.  Bony changes compatible with multiple myeloma, with the most significant lesions in the left side of the sacrum, and in the L3 vertebral body, as detailed above.  There are also small lytic lesions in the C7 spinous process and the left distal clavicle. 2.  4 mm nodule in the lateral segment of the right middle lobe is nonspecific. If the patient is at high risk for bronchogenic carcinoma, follow-up chest CT at 1 year is recommended.  If the patient is at low risk, no follow-up is needed.  This recommendation follows the consensus statement: Guidelines for Management of Small Pulmonary Nodules Detected on CT Scans:  A Statement from the Fleischner Society as published in Radiology 2005; 237:395-400. 3. Atherosclerosis, including left main and two-vessel coronary artery disease. Please note that although the presence of coronary artery calcium documents the presence of coronary artery disease, the severity of this disease and any potential stenosis cannot be assessed on this non-gated CT examination.  Assessment for potential risk factor modification, dietary therapy or pharmacologic therapy may be warranted, if clinically indicated.  Original Report Authenticated By: Florencia Reasons, M.D.     Assessment:  Rodney [name] is a 71 year old [place] and gentleman with stage I free lambda light chain multiple myeloma diagnosed 01/05/2011 after long-standing history of plasmacytoma. Due  for his next monthly dose of Zometa and to initiate to drug induction therapy as outlined by Dr. Dorothea Ogle at [facility].  Case has been reviewed with Dr. Pierce Crane who also spoke with Rodney [name].   Plan:  Patient will receive Zometa today as scheduled. We will also obtain approval to initiate Revlimid 15 mg daily 3 weeks on one week off. This will be given in combination with dexamethasone 20 mg every week for 3 weeks on one week off.  Rodney [name] is scheduled for followup in 06/29/2011 which will include a CBC. Of note, he has a history of pulmonary embolism, he is managed on Coumadin via off-site Coumadin clinic. At the time of his followup on 06/29/2011, PT/INR will  be added. As noted, Revlimid will be initiated, side effect profile has been reviewed and authorization via his insurance company has been approved.   This plan was reviewed with the patient, who voices understanding and agreement.  She knows to call with any changes or problems.    Lolly Glaus T, PA-C 06/18/2011

## 2011-06-19 ENCOUNTER — Encounter: Payer: Self-pay | Admitting: *Deleted

## 2011-06-19 LAB — SPEP & IFE WITH QIG
Albumin ELP: 56.6 % (ref 55.8–66.1)
Alpha-1-Globulin: 4 % (ref 2.9–4.9)
Beta 2: 7 % — ABNORMAL HIGH (ref 3.2–6.5)
IgM, Serum: 225 mg/dL (ref 41–251)

## 2011-06-19 LAB — COMPREHENSIVE METABOLIC PANEL
AST: 21 U/L (ref 0–37)
Albumin: 4 g/dL (ref 3.5–5.2)
Alkaline Phosphatase: 106 U/L (ref 39–117)
BUN: 15 mg/dL (ref 6–23)
Potassium: 4 mEq/L (ref 3.5–5.3)

## 2011-06-19 LAB — KAPPA/LAMBDA LIGHT CHAINS
Kappa free light chain: 1.53 mg/dL (ref 0.33–1.94)
Kappa:Lambda Ratio: 0.09 — ABNORMAL LOW (ref 0.26–1.65)
Lambda Free Lght Chn: 16.2 mg/dL — ABNORMAL HIGH (ref 0.57–2.63)

## 2011-06-19 NOTE — Progress Notes (Signed)
LATE ENTRY; PT WAS ENROLLED IN [employer] PROGRAM ON 06/15/11. PT READ AND SIGNED NECESSARY PAPERWORK.  PAPERWORK WAS THEN FAXED LATE AFTERNOON TO [employer]. 06/18/11 AUTHORIZATION WAS OBTAINED FROM [employer]. RX AND REQUEST HAS BEEN SENT TO [facility]ARM[facility] [phone]

## 2011-06-21 ENCOUNTER — Encounter: Payer: Self-pay | Admitting: Medical Oncology

## 2011-06-21 NOTE — Progress Notes (Signed)
Received confirmation from [employer] that Revlimid prescription had been processed and shipped.

## 2011-06-26 ENCOUNTER — Encounter: Payer: Self-pay | Admitting: Oncology

## 2011-06-26 NOTE — Progress Notes (Signed)
06/06/11 Dr Donnie Coffin requested I check to see about Revlimid.  I spoke to the patient and Ellis Savage.  Dr. Marissa Calamity' office started the process.  06/19/11 [employer] sent a fax stating they were working on the assistance.  I called Debby. She had spoken to the wife and everything is being worked on.

## 2011-06-27 ENCOUNTER — Telehealth: Payer: Self-pay | Admitting: Oncology

## 2011-06-27 NOTE — Telephone Encounter (Signed)
Pt's wife called re reminder message received about appt for 4/26 @ 10:15 am. Per wife appt was originally scheduled for 1:15 pm and wants to know why the time change. Per wife they did not ask for it to be moved but did receive a call from Jacki Cones Monday re moving appt to 10:15 am. Per wife she told Jacki Cones they could not come in the AM and didn't want appt moved. Per wife it was agreed at that time that the appt would be left @ 1:15 pm. appt for 4/26 is schedule for 10:15 am Jacki Cones 4/22) notes appts no and no documentation. No availability at this time to move appt back to 1:15 pm. Wife made aware and will keep appt for 4/26 @ 10:15 am.

## 2011-06-28 ENCOUNTER — Other Ambulatory Visit: Payer: Self-pay | Admitting: Physician Assistant

## 2011-06-29 ENCOUNTER — Other Ambulatory Visit (HOSPITAL_BASED_OUTPATIENT_CLINIC_OR_DEPARTMENT_OTHER): Payer: Medicare Other | Admitting: Lab

## 2011-06-29 ENCOUNTER — Ambulatory Visit (HOSPITAL_BASED_OUTPATIENT_CLINIC_OR_DEPARTMENT_OTHER): Payer: Medicare Other | Admitting: Physician Assistant

## 2011-06-29 ENCOUNTER — Ambulatory Visit: Payer: Medicare Other

## 2011-06-29 ENCOUNTER — Telehealth: Payer: Self-pay | Admitting: Physician Assistant

## 2011-06-29 ENCOUNTER — Telehealth: Payer: Self-pay | Admitting: *Deleted

## 2011-06-29 ENCOUNTER — Encounter: Payer: Self-pay | Admitting: Physician Assistant

## 2011-06-29 VITALS — BP 116/78 | HR 65 | Temp 97.8°F | Ht 71.0 in | Wt 217.8 lb

## 2011-06-29 DIAGNOSIS — Z7901 Long term (current) use of anticoagulants: Secondary | ICD-10-CM

## 2011-06-29 DIAGNOSIS — C9000 Multiple myeloma not having achieved remission: Secondary | ICD-10-CM

## 2011-06-29 LAB — CBC WITH DIFFERENTIAL/PLATELET
BASO%: 0.7 % (ref 0.0–2.0)
MCHC: 34.1 g/dL (ref 32.0–36.0)
MONO#: 1 10*3/uL — ABNORMAL HIGH (ref 0.1–0.9)
RBC: 4.89 10*6/uL (ref 4.20–5.82)
RDW: 17.1 % — ABNORMAL HIGH (ref 11.0–14.6)
WBC: 7.5 10*3/uL (ref 4.0–10.3)
lymph#: 0.7 10*3/uL — ABNORMAL LOW (ref 0.9–3.3)
nRBC: 0 % (ref 0–0)

## 2011-06-29 LAB — COMPREHENSIVE METABOLIC PANEL
ALT: 32 U/L (ref 0–53)
AST: 24 U/L (ref 0–37)
Albumin: 4.2 g/dL (ref 3.5–5.2)
Alkaline Phosphatase: 101 U/L (ref 39–117)
Calcium: 8.7 mg/dL (ref 8.4–10.5)
Chloride: 106 mEq/L (ref 96–112)
Potassium: 4.1 mEq/L (ref 3.5–5.3)

## 2011-06-29 NOTE — Progress Notes (Signed)
Hematology and Oncology Follow Up Visit  Rodney [name] [id number] [birth year] 71 y.o. 06/29/2011    HPI: Rodney [name] is a 71 year old [place] and gentleman with stage I free lambda light chain multiple myeloma diagnosed 01/05/2011 after long-standing history of plasmacytoma. He has received 2 doses of Zometa, last given on 06/15/2011. He initiated Revlimid 15 mg daily 3 weeks on one week off given in combination with dexamethasone 20 mg on day 1 during week 1, 2, and 3 as outlined by Dr. Dorothea Ogle at [facility].  Interim History:   Rodney [name] is seen today with his wife in accompaniment for followup pertaining to stage I free lambda light chain multiple myeloma for which he is now receiving Revlimid 15 mg daily 3 weeks on one week off given in combination with dexamethasone 20 mg on day one for week one 2 and 3 as outlined by Dr. Dorothea Ogle at [facility]. He started treatment 6 days ago. He is feeling well, he denies any unexplained fevers, chills, or night sweats. No shortness of breath or chest pain issues. He did have some emotional lability i.e. progression about 3 days after his dexamethasone dosing, but he is aware of this symptom. He denies any bleeding or bruising symptoms. He has no neuropathy symptoms.   A  detailed review of systems is otherwise noncontributory as noted below.  Review of Systems: Constitutional:  no weight loss, fever, night sweats and feels well Eyes: no complaints ENT: no complaints Cardiovascular: no chest pain or dyspnea on exertion Respiratory: no cough, shortness of breath, or wheezing Neurological: no TIA or stroke symptoms Dermatological: negative Gastrointestinal: no abdominal pain, change in bowel habits, or black or bloody stools Genito-Urinary: no dysuria, trouble voiding, or hematuria Hematological and Lymphatic: negative Breast: negative Musculoskeletal: positive for - joint pain following first dose of monthly  Zometa. Remaining ROS negative.   Medications:   I have reviewed the patient's current medications.  Current Outpatient Prescriptions  Medication Sig Dispense Refill  . acetaminophen (TYLENOL) 500 MG tablet Take 1-2 tablets (500-1,000 mg total) by mouth every 6 (six) hours as needed for pain. For pain  30 tablet  1  . calcium carbonate (TUMS - DOSED IN MG ELEMENTAL CALCIUM) 500 MG chewable tablet Chew 1 tablet by mouth every 2 (two) hours as needed. HEART BURN        . Calcium-Vitamin D (CALTRATE 600 PLUS-VIT D PO) Take 1 tablet by mouth every other day.        . methylcellulose (ARTIFICIAL TEARS) 1 % ophthalmic solution Place 2 drops into both eyes 2 (two) times daily as needed. DRY EYES       . Multiple Vitamins-Minerals (MULTIVITAMINS THER. W/MINERALS) TABS Take 1 tablet by mouth every other day.        Marland Kitchen omeprazole (PRILOSEC) 20 MG capsule Take 20 mg by mouth 2 (two) times daily as needed. HEART BURN       . traMADol (ULTRAM) 50 MG tablet Take 50 mg by mouth every 6 (six) hours as needed. For pain      . traMADol (ULTRAM) 50 MG tablet TAKE ONE TABLET BY MOUTH EVERY 8 HOURS AS NEEDED FOR PAIN  60 tablet  3  . warfarin (COUMADIN) 5 MG tablet TAKE ONE TABLET BY MOUTH DAILY.  90 tablet  0    Allergies:  Allergies  Allergen Reactions  . Zometa (Zoledronic Acid)     The patient had a flulike response to this medication  Physical Exam: Filed Vitals:   06/29/11 1037  BP: 116/78  Pulse: 65  Temp: 97.8 F (36.6 C)    Body mass index is 30.38 kg/(m^2). Weight: 217 lbs. HEENT:  Sclerae anicteric, conjunctivae pink.  Oropharynx clear.  No mucositis or candidiasis.   Nodes:  No cervical, supraclavicular, or axillary lymphadenopathy palpated.  Lungs:  Clear to auscultation bilaterally.  No crackles, rhonchi, or wheezes.   Heart:  Regular rate and rhythm.   Abdomen:  Soft, nontender.  Positive bowel sounds.  No organomegaly or masses palpated.   Musculoskeletal:  No focal spinal  tenderness to gentle palpation.  Extremities:  Benign.  No peripheral edema or cyanosis.   Skin:  Benign.   Neuro:  Nonfocal, alert and oriented x 3.   Lab Results: Lab Results  Component Value Date   WBC 7.5 06/29/2011   HGB 14.2 06/29/2011   HCT 41.7 06/29/2011   MCV 85.3 06/29/2011   PLT 256 06/29/2011   NEUTROABS 5.5 06/29/2011     Chemistry      Component Value Date/Time   NA 140 06/15/2011 1232   K 4.0 06/15/2011 1232   CL 109 06/15/2011 1232   CO2 22 06/15/2011 1232   BUN 15 06/15/2011 1232   CREATININE 0.87 06/15/2011 1232   CREATININE 0.91 01/01/2011 1630      Component Value Date/Time   CALCIUM 8.9 06/15/2011 1232   ALKPHOS 106 06/15/2011 1232   AST 21 06/15/2011 1232   ALT 17 06/15/2011 1232   BILITOT 0.3 06/15/2011 1232      No results found for this basename: LABCA2    Radiological Studies: Dg Chest 2 View 05/19/2011  *RADIOLOGY REPORT*  Clinical Data: Fever and cough  CHEST - 2 VIEW  Comparison: Chest radiograph 01/12/2011  Findings: Normal cardiac silhouette.  There is mild atelectasis of the lung bases which is similar to prior.  No focal consolidation. No pneumothorax. Degenerative osteophytosis of the thoracic spine.  IMPRESSION:  1.  No acute findings. 2.  Mild basilar atelectasis.  Original Report Authenticated By: Genevive Bi, M.D.   Nm Pet Image Restag (ps) Skull Base To Thigh 05/28/2011  *RADIOLOGY REPORT*  Clinical Data: Subsequent treatment strategy for multiple myeloma.  NUCLEAR MEDICINE PET CT RESTAGING (PS) SKULL BASE TO THIGH  Technique:  14.5 mCi F-18 FDG was injected intravenously via the right AC.  Full-ring PET imaging was performed from the skull base through the mid-thighs 60  minutes after injection.  CT data was obtained and used for attenuation correction and anatomic localization only.  (This was not acquired as a diagnostic CT examination.)  Fasting Blood Glucose:  79  Patient Weight:  210 pounds.  Comparison: Chest CT 07/24/2010.  CT of abdomen  and pelvis 12/31/2010.  Findings:  Skull Base and Neck:  No abnormal hypermetabolic activity. No abnormal soft tissue masses or lymphadenopathy.  There is a lytic lesion in the spinous process of C7, without any associated hypermetabolic activity.  Thorax:  No abnormal hypermetabolic activity. 4 mm nodule in the lateral segment of the right middle lobe (image 107 of series 2). There are no other larger more suspicious appearing pulmonary nodules or masses identified.  Heart size is mildly enlarged. There is no significant pericardial fluid, thickening or pericardial calcification.  There is atherosclerosis of the thoracic aorta, the great vessels of the mediastinum and the coronary arteries, including calcified atherosclerotic plaque in the left main, left anterior descending and right coronary arteries. No pathologically enlarged mediastinal or  hilar lymph nodes. Calcified low right paratracheal lymph nodes incidentally noted.   A small lytic lesion is noted in the lateral aspect of the left clavicle (image 56 of series 2), without any associated hypermetabolic activity on the PET portion of the examination.  Abdomen/Pelvis: The unenhanced appearance of the liver, pancreas, bilateral adrenal glands and bilateral kidneys is unremarkable. Mild bilateral perinephric stranding is noted, and is nonspecific (occasionally associated with renal insufficiency).  The patient is status post cholecystectomy.  Calcifications within the spleen are consistent with calcified granulomas.  No ascites or pneumoperitoneum and no pathologic distension of bowel.  No definite pathologic adenopathy identified within the abdomen or pelvis.  Postoperative changes of pelvic lymph node dissection are noted (particularly on the right).  Compared to the prior lumbar spine CT scan dated 12/31/2010, there has been interval placement of a bone graft within the vertebral body at L3, and fusion from L2-L4.  Hypermetabolic activity is noted within L3  (SUVmax = 4.7), as well as the posterior paraspinal soft tissues (SUVmax = 4.6).  There continues to be a large lytic lesion in L3, with little incorporation of the bone graft.  A large lytic lesion is again noted in the left side of the sacrum, demonstrating hypermetabolic activity (SUVmax = 4.8).  Upper Thighs:  No abnormal hypermetabolic activity. No abnormal soft tissue masses or lymphadenopathy.  IMPRESSION: 1.  Bony changes compatible with multiple myeloma, with the most significant lesions in the left side of the sacrum, and in the L3 vertebral body, as detailed above.  There are also small lytic lesions in the C7 spinous process and the left distal clavicle. 2.  4 mm nodule in the lateral segment of the right middle lobe is nonspecific. If the patient is at high risk for bronchogenic carcinoma, follow-up chest CT at 1 year is recommended.  If the patient is at low risk, no follow-up is needed.  This recommendation follows the consensus statement: Guidelines for Management of Small Pulmonary Nodules Detected on CT Scans:  A Statement from the Fleischner Society as published in Radiology 2005; 237:395-400. 3. Atherosclerosis, including left main and two-vessel coronary artery disease. Please note that although the presence of coronary artery calcium documents the presence of coronary artery disease, the severity of this disease and any potential stenosis cannot be assessed on this non-gated CT examination.  Assessment for potential risk factor modification, dietary therapy or pharmacologic therapy may be warranted, if clinically indicated.  Original Report Authenticated By: Florencia Reasons, M.D.     Assessment:  Rodney [name] is a 71 year old [place] and gentleman with stage I free lambda light chain multiple myeloma diagnosed 01/05/2011 after long-standing history of plasmacytoma. He has received 2 doses of Zometa, last given on 06/15/2011. He initiated Revlimid 15 mg daily 3 weeks on  one week off given in combination with dexamethasone 20 mg on day 1 during week 1, 2, and 3 as outlined by Dr. Dorothea Ogle at [facility]. Excellent tolerance.  Case has been reviewed with Dr. Pierce Crane who also spoke with Rodney [name].   Plan:  Rodney [name] will continue on Revlimid at 15 mg daily for 3 weeks on one week off, he'll also continue dexamethasone 20 mg on a weekly basis for 3 weeks on one week off correlating with his Revlimid dosings. He'll return on 07/13/2011 for his next dose of Zometa. He knows to contact us sooner if the need should arise.   Anias Bartol T, PA-C 06/29/2011

## 2011-06-29 NOTE — Telephone Encounter (Signed)
Per staff message from [place], I have scheduled that patients treatments. Crystal aware appts in computer.  JMW

## 2011-06-29 NOTE — Telephone Encounter (Signed)
gve the pt his June,july 2013 appt calendar. Sent michelle a staff message to add the chemo appts. Pt is aware

## 2011-07-04 ENCOUNTER — Ambulatory Visit: Payer: Medicare Other

## 2011-07-04 DIAGNOSIS — Z86718 Personal history of other venous thrombosis and embolism: Secondary | ICD-10-CM

## 2011-07-04 LAB — POCT INR: INR: 2.6

## 2011-07-04 NOTE — Patient Instructions (Signed)
  Latest dosing instructions   Total Sun Mon Tue Wed Thu Fri Sat   35 5 mg 5 mg 5 mg 5 mg 5 mg 5 mg 5 mg    (5 mg1) (5 mg1) (5 mg1) (5 mg1) (5 mg1) (5 mg1) (5 mg1)        

## 2011-07-13 ENCOUNTER — Ambulatory Visit (HOSPITAL_BASED_OUTPATIENT_CLINIC_OR_DEPARTMENT_OTHER): Payer: Medicare Other

## 2011-07-13 ENCOUNTER — Ambulatory Visit (HOSPITAL_BASED_OUTPATIENT_CLINIC_OR_DEPARTMENT_OTHER): Payer: Medicare Other | Admitting: Physician Assistant

## 2011-07-13 ENCOUNTER — Other Ambulatory Visit (HOSPITAL_BASED_OUTPATIENT_CLINIC_OR_DEPARTMENT_OTHER): Payer: Medicare Other | Admitting: Lab

## 2011-07-13 ENCOUNTER — Encounter: Payer: Self-pay | Admitting: Physician Assistant

## 2011-07-13 DIAGNOSIS — Z7901 Long term (current) use of anticoagulants: Secondary | ICD-10-CM

## 2011-07-13 DIAGNOSIS — Z5181 Encounter for therapeutic drug level monitoring: Secondary | ICD-10-CM

## 2011-07-13 DIAGNOSIS — C9000 Multiple myeloma not having achieved remission: Secondary | ICD-10-CM

## 2011-07-13 LAB — PROTIME-INR
INR: 2.5 (ref 2.00–3.50)
Protime: 30 Seconds — ABNORMAL HIGH (ref 10.6–13.4)

## 2011-07-13 LAB — CBC WITH DIFFERENTIAL/PLATELET
Basophils Absolute: 0.1 10*3/uL (ref 0.0–0.1)
Eosinophils Absolute: 0.3 10*3/uL (ref 0.0–0.5)
HGB: 14.7 g/dL (ref 13.0–17.1)
MONO#: 1.4 10*3/uL — ABNORMAL HIGH (ref 0.1–0.9)
NEUT#: 4.1 10*3/uL (ref 1.5–6.5)
RDW: 16.6 % — ABNORMAL HIGH (ref 11.0–14.6)
WBC: 6.6 10*3/uL (ref 4.0–10.3)
lymph#: 0.8 10*3/uL — ABNORMAL LOW (ref 0.9–3.3)
nRBC: 0 % (ref 0–0)

## 2011-07-13 MED ORDER — ZOLEDRONIC ACID 4 MG/100ML IV SOLN
4.0000 mg | Freq: Once | INTRAVENOUS | Status: AC
  Administered 2011-07-13: 4 mg via INTRAVENOUS
  Filled 2011-07-13: qty 100

## 2011-07-13 NOTE — Progress Notes (Signed)
Hematology and Oncology Follow Up Visit  [name] [id number] May 14, 1940 71 y.o. 07/13/2011    HPI: Rodney [name] is a 71 year old [place] and gentleman with stage I free lambda light chain multiple myeloma diagnosed 01/05/2011 after long-standing history of plasmacytoma. He has received 2 doses of Zometa, last given on 06/15/2011, due for his next monthly dosing today.He initiated Revlimid 15 mg daily 3 weeks on one week off given in combination with dexamethasone 20 mg on day 1 during week 1, 2, and 3 as outlined by Dr. Dorothea Ogle at [facility].  Interim History:   Rodney [name] is seen today with his wife in accompaniment for followup pertaining to stage I free lambda light chain multiple myeloma for which he is now receiving Revlimid 15 mg daily 3 weeks on one week off given in combination with dexamethasone 20 mg on day one for week one 2 and 3 as outlined by Dr. Dorothea Ogle at [facility]. He started treatment 3 weeks ago. He is feeling well, he denies any unexplained fevers, chills, or night sweats. No shortness of breath or chest pain issues. He did have some emotional lability i.e. about 3 days after his dexamethasone dosing, but he is aware of the cause ofthis symptom. He denies any bleeding or bruising symptoms. He has no neuropathy symptoms.   A  detailed review of systems is otherwise noncontributory as noted below.  Review of Systems: Constitutional:  no weight loss, fever, night sweats and feels well Eyes: no complaints ENT: no complaints Cardiovascular: no chest pain or dyspnea on exertion Respiratory: no cough, shortness of breath, or wheezing Neurological: no TIA or stroke symptoms Dermatological: negative Gastrointestinal: no abdominal pain, change in bowel habits, or black or bloody stools Genito-Urinary: no dysuria, trouble voiding, or hematuria Hematological and Lymphatic: negative Breast: negative Musculoskeletal: positive for - joint pain  following first dose of monthly Zometa. Remaining ROS negative.   Medications:   I have reviewed the patient's current medications.  Current Outpatient Prescriptions  Medication Sig Dispense Refill  . acetaminophen (TYLENOL) 500 MG tablet Take 1-2 tablets (500-1,000 mg total) by mouth every 6 (six) hours as needed for pain. For pain  30 tablet  1  . calcium carbonate (TUMS - DOSED IN MG ELEMENTAL CALCIUM) 500 MG chewable tablet Chew 1 tablet by mouth every 2 (two) hours as needed. HEART BURN        . Calcium-Vitamin D (CALTRATE 600 PLUS-VIT D PO) Take 1 tablet by mouth every other day.        . methylcellulose (ARTIFICIAL TEARS) 1 % ophthalmic solution Place 2 drops into both eyes 2 (two) times daily as needed. DRY EYES       . Multiple Vitamins-Minerals (MULTIVITAMINS THER. W/MINERALS) TABS Take 1 tablet by mouth every other day.        Marland Kitchen omeprazole (PRILOSEC) 20 MG capsule Take 20 mg by mouth 2 (two) times daily as needed. HEART BURN       . traMADol (ULTRAM) 50 MG tablet Take 50 mg by mouth every 6 (six) hours as needed. For pain      . traMADol (ULTRAM) 50 MG tablet TAKE ONE TABLET BY MOUTH EVERY 8 HOURS AS NEEDED FOR PAIN  60 tablet  3  . warfarin (COUMADIN) 5 MG tablet TAKE ONE TABLET BY MOUTH DAILY.  90 tablet  0    Allergies:  Allergies  Allergen Reactions  . Zometa (Zoledronic Acid)     The patient had  a flulike response to this medication    Physical Exam: Filed Vitals:   07/13/11 1349  BP: 109/74  Pulse: 69  Temp: 97 F (36.1 C)    Body mass index is 30.63 kg/(m^2). Weight: 219 lbs. HEENT:  Sclerae anicteric, conjunctivae pink.  Oropharynx clear.  No mucositis or candidiasis.   Nodes:  No cervical, supraclavicular, or axillary lymphadenopathy palpated.  Lungs:  Clear to auscultation bilaterally.  No crackles, rhonchi, or wheezes.   Heart:  Regular rate and rhythm.   Abdomen:  Soft, nontender.  Positive bowel sounds.  No organomegaly or masses palpated.     Musculoskeletal:  No focal spinal tenderness to gentle palpation.  Extremities:  Benign.  No peripheral edema or cyanosis.   Skin:  Benign.   Neuro:  Nonfocal, alert and oriented x 3.   Lab Results: Lab Results  Component Value Date   WBC 6.6 07/13/2011   HGB 14.7 07/13/2011   HCT 43.2 07/13/2011   MCV 85.0 07/13/2011   PLT 217 07/13/2011   NEUTROABS 4.1 07/13/2011     Chemistry      Component Value Date/Time   NA 139 06/29/2011 1004   K 4.1 06/29/2011 1004   CL 106 06/29/2011 1004   CO2 24 06/29/2011 1004   BUN 14 06/29/2011 1004   CREATININE 0.87 06/29/2011 1004   CREATININE 0.91 01/01/2011 1630      Component Value Date/Time   CALCIUM 8.7 06/29/2011 1004   ALKPHOS 101 06/29/2011 1004   AST 24 06/29/2011 1004   ALT 32 06/29/2011 1004   BILITOT 0.5 06/29/2011 1004      No results found for this basename: LABCA2    Radiological Studies: Dg Chest 2 View 05/19/2011  *RADIOLOGY REPORT*  Clinical Data: Fever and cough  CHEST - 2 VIEW  Comparison: Chest radiograph 01/12/2011  Findings: Normal cardiac silhouette.  There is mild atelectasis of the lung bases which is similar to prior.  No focal consolidation. No pneumothorax. Degenerative osteophytosis of the thoracic spine.  IMPRESSION:  1.  No acute findings. 2.  Mild basilar atelectasis.  Original Report Authenticated By: Genevive Bi, M.D.   Nm Pet Image Restag (ps) Skull Base To Thigh 05/28/2011  *RADIOLOGY REPORT*  Clinical Data: Subsequent treatment strategy for multiple myeloma.  NUCLEAR MEDICINE PET CT RESTAGING (PS) SKULL BASE TO THIGH  Technique:  14.5 mCi F-18 FDG was injected intravenously via the right AC.  Full-ring PET imaging was performed from the skull base through the mid-thighs 60  minutes after injection.  CT data was obtained and used for attenuation correction and anatomic localization only.  (This was not acquired as a diagnostic CT examination.)  Fasting Blood Glucose:  79  Patient Weight:  210 pounds.  Comparison:  Chest CT 07/24/2010.  CT of abdomen and pelvis 12/31/2010.  Findings:  Skull Base and Neck:  No abnormal hypermetabolic activity. No abnormal soft tissue masses or lymphadenopathy.  There is a lytic lesion in the spinous process of C7, without any associated hypermetabolic activity.  Thorax:  No abnormal hypermetabolic activity. 4 mm nodule in the lateral segment of the right middle lobe (image 107 of series 2). There are no other larger more suspicious appearing pulmonary nodules or masses identified.  Heart size is mildly enlarged. There is no significant pericardial fluid, thickening or pericardial calcification.  There is atherosclerosis of the thoracic aorta, the great vessels of the mediastinum and the coronary arteries, including calcified atherosclerotic plaque in the left main, left anterior  descending and right coronary arteries. No pathologically enlarged mediastinal or hilar lymph nodes. Calcified low right paratracheal lymph nodes incidentally noted.   A small lytic lesion is noted in the lateral aspect of the left clavicle (image 56 of series 2), without any associated hypermetabolic activity on the PET portion of the examination.  Abdomen/Pelvis: The unenhanced appearance of the liver, pancreas, bilateral adrenal glands and bilateral kidneys is unremarkable. Mild bilateral perinephric stranding is noted, and is nonspecific (occasionally associated with renal insufficiency).  The patient is status post cholecystectomy.  Calcifications within the spleen are consistent with calcified granulomas.  No ascites or pneumoperitoneum and no pathologic distension of bowel.  No definite pathologic adenopathy identified within the abdomen or pelvis.  Postoperative changes of pelvic lymph node dissection are noted (particularly on the right).  Compared to the prior lumbar spine CT scan dated 12/31/2010, there has been interval placement of a bone graft within the vertebral body at L3, and fusion from L2-L4.   Hypermetabolic activity is noted within L3 (SUVmax = 4.7), as well as the posterior paraspinal soft tissues (SUVmax = 4.6).  There continues to be a large lytic lesion in L3, with little incorporation of the bone graft.  A large lytic lesion is again noted in the left side of the sacrum, demonstrating hypermetabolic activity (SUVmax = 4.8).  Upper Thighs:  No abnormal hypermetabolic activity. No abnormal soft tissue masses or lymphadenopathy.  IMPRESSION: 1.  Bony changes compatible with multiple myeloma, with the most significant lesions in the left side of the sacrum, and in the L3 vertebral body, as detailed above.  There are also small lytic lesions in the C7 spinous process and the left distal clavicle. 2.  4 mm nodule in the lateral segment of the right middle lobe is nonspecific. If the patient is at high risk for bronchogenic carcinoma, follow-up chest CT at 1 year is recommended.  If the patient is at low risk, no follow-up is needed.  This recommendation follows the consensus statement: Guidelines for Management of Small Pulmonary Nodules Detected on CT Scans:  A Statement from the Fleischner Society as published in Radiology 2005; 237:395-400. 3. Atherosclerosis, including left main and two-vessel coronary artery disease. Please note that although the presence of coronary artery calcium documents the presence of coronary artery disease, the severity of this disease and any potential stenosis cannot be assessed on this non-gated CT examination.  Assessment for potential risk factor modification, dietary therapy or pharmacologic therapy may be warranted, if clinically indicated.  Original Report Authenticated By: Florencia Reasons, M.D.     Assessment:  Rodney [name] is a 71 year old [place] and gentleman with stage I free lambda light chain multiple myeloma diagnosed 01/05/2011 after long-standing history of plasmacytoma. He has received 2 doses of Zometa, last given on 06/15/2011.  He initiated Revlimid 15 mg daily 3 weeks on one week off given in combination with dexamethasone 20 mg on day 1 during week 1, 2, and 3 as outlined by Dr. Dorothea Ogle at [facility]. Excellent tolerance.  Case to be reviewed with Dr. Pierce Crane.  Plan:  Rodney [name] will receive Zometa today as scheduled.  He will also continue on Revlimid at 15 mg daily for 3 weeks on one week off, he'll also continue dexamethasone 20 mg on a weekly basis for 3 weeks on one week off correlating with his Revlimid dosings. He'll return on 08/10/2011 for his next dose of Zometa. He knows to contact us sooner if  the need should arise.   Treniece Holsclaw T, PA-C 07/13/2011

## 2011-07-16 ENCOUNTER — Encounter: Payer: Self-pay | Admitting: *Deleted

## 2011-07-16 ENCOUNTER — Other Ambulatory Visit: Payer: Self-pay | Admitting: *Deleted

## 2011-07-16 MED ORDER — LENALIDOMIDE 15 MG PO CAPS: 15.0000 mg | ORAL_CAPSULE | Freq: Every day | ORAL | Status: DC

## 2011-07-16 NOTE — Progress Notes (Unsigned)
Obtained authorization [id number] and faxed completed RX for Revlimid to [facility] specialty pharmacy

## 2011-07-17 LAB — COMPREHENSIVE METABOLIC PANEL
ALT: 45 U/L (ref 0–53)
Albumin: 4 g/dL (ref 3.5–5.2)
CO2: 16 mEq/L — ABNORMAL LOW (ref 19–32)
Glucose, Bld: 97 mg/dL (ref 70–99)
Potassium: 3.9 mEq/L (ref 3.5–5.3)
Sodium: 136 mEq/L (ref 135–145)
Total Protein: 6.6 g/dL (ref 6.0–8.3)

## 2011-07-17 LAB — IMMUNOFIXATION ELECTROPHORESIS
IgA: 433 mg/dL — ABNORMAL HIGH (ref 68–379)
IgG (Immunoglobin G), Serum: 1090 mg/dL (ref 650–1600)
Total Protein, Serum Electrophoresis: 6.6 g/dL (ref 6.0–8.3)

## 2011-07-17 LAB — KAPPA/LAMBDA LIGHT CHAINS: Lambda Free Lght Chn: 8 mg/dL — ABNORMAL HIGH (ref 0.57–2.63)

## 2011-07-31 ENCOUNTER — Ambulatory Visit (INDEPENDENT_AMBULATORY_CARE_PROVIDER_SITE_OTHER): Payer: Medicare Other | Admitting: Family

## 2011-07-31 DIAGNOSIS — Z86718 Personal history of other venous thrombosis and embolism: Secondary | ICD-10-CM

## 2011-07-31 NOTE — Patient Instructions (Signed)
  Latest dosing instructions   Total Sun Mon Tue Wed Thu Fri Sat   35 5 mg 5 mg 5 mg 5 mg 5 mg 5 mg 5 mg    (5 mg1) (5 mg1) (5 mg1) (5 mg1) (5 mg1) (5 mg1) (5 mg1)        

## 2011-08-02 ENCOUNTER — Telehealth: Payer: Self-pay | Admitting: *Deleted

## 2011-08-02 NOTE — Telephone Encounter (Signed)
Victorino Dike calls.  Dr. Marissa Calamity would like to follow-up with patient after 2 cycles.  When did patient start tx and how many cycles has patient had?  Called her back and from chart notes it looks like patient started revlimid on 06/23/11...3 weeks on and 1 week off.  The second cycle would have started on 07/22/11.  Pt. Is also on decadron and zometa.   Let this message on her voice mail.

## 2011-08-08 ENCOUNTER — Other Ambulatory Visit: Payer: Self-pay | Admitting: *Deleted

## 2011-08-08 NOTE — Telephone Encounter (Signed)
Faxed request to refill revlimid.  Pt. Sees Debbora Presto NP Friday 6/7.  Left @ desk (CS) for review.

## 2011-08-09 ENCOUNTER — Other Ambulatory Visit: Payer: Self-pay | Admitting: *Deleted

## 2011-08-09 ENCOUNTER — Encounter: Payer: Self-pay | Admitting: *Deleted

## 2011-08-09 MED ORDER — LENALIDOMIDE 15 MG PO CAPS: 15.0000 mg | ORAL_CAPSULE | Freq: Every day | ORAL | Status: DC

## 2011-08-09 NOTE — Telephone Encounter (Signed)
AUTH# [id number] OBTAINED

## 2011-08-10 ENCOUNTER — Ambulatory Visit (INDEPENDENT_AMBULATORY_CARE_PROVIDER_SITE_OTHER): Payer: Medicare Other | Admitting: Internal Medicine

## 2011-08-10 ENCOUNTER — Ambulatory Visit (HOSPITAL_BASED_OUTPATIENT_CLINIC_OR_DEPARTMENT_OTHER): Payer: Medicare Other

## 2011-08-10 ENCOUNTER — Ambulatory Visit (HOSPITAL_BASED_OUTPATIENT_CLINIC_OR_DEPARTMENT_OTHER): Payer: Medicare Other | Admitting: Physician Assistant

## 2011-08-10 ENCOUNTER — Other Ambulatory Visit (HOSPITAL_BASED_OUTPATIENT_CLINIC_OR_DEPARTMENT_OTHER): Payer: Medicare Other

## 2011-08-10 VITALS — BP 117/75 | HR 69 | Temp 97.5°F | Ht 71.0 in | Wt 216.9 lb

## 2011-08-10 VITALS — BP 120/70 | HR 72 | Temp 98.2°F | Resp 16 | Ht 71.0 in | Wt 218.0 lb

## 2011-08-10 DIAGNOSIS — C9000 Multiple myeloma not having achieved remission: Secondary | ICD-10-CM

## 2011-08-10 DIAGNOSIS — Z7901 Long term (current) use of anticoagulants: Secondary | ICD-10-CM

## 2011-08-10 DIAGNOSIS — I10 Essential (primary) hypertension: Secondary | ICD-10-CM

## 2011-08-10 DIAGNOSIS — Z5181 Encounter for therapeutic drug level monitoring: Secondary | ICD-10-CM

## 2011-08-10 LAB — CBC WITH DIFFERENTIAL/PLATELET
Basophils Absolute: 0.1 10*3/uL (ref 0.0–0.1)
EOS%: 6.8 % (ref 0.0–7.0)
Eosinophils Absolute: 0.4 10*3/uL (ref 0.0–0.5)
HGB: 15.6 g/dL (ref 13.0–17.1)
MCV: 85.7 fL (ref 79.3–98.0)
MONO%: 17.2 % — ABNORMAL HIGH (ref 0.0–14.0)
NEUT#: 3.6 10*3/uL (ref 1.5–6.5)
RBC: 5.26 10*6/uL (ref 4.20–5.82)
RDW: 15.6 % — ABNORMAL HIGH (ref 11.0–14.6)
lymph#: 0.8 10*3/uL — ABNORMAL LOW (ref 0.9–3.3)

## 2011-08-10 MED ORDER — SODIUM CHLORIDE 0.9 % IV SOLN
Freq: Once | INTRAVENOUS | Status: AC
  Administered 2011-08-10: 10:00:00 via INTRAVENOUS

## 2011-08-10 MED ORDER — TRAMADOL HCL 50 MG PO TABS: 50.0000 mg | ORAL_TABLET | Freq: Four times a day (QID) | ORAL | Status: DC | PRN

## 2011-08-10 MED ORDER — ZOLEDRONIC ACID 4 MG/100ML IV SOLN
4.0000 mg | Freq: Once | INTRAVENOUS | Status: AC
  Administered 2011-08-10: 4 mg via INTRAVENOUS
  Filled 2011-08-10: qty 100

## 2011-08-10 NOTE — Patient Instructions (Signed)
[facility] Discharge Instructions for Patients Receiving Chemotherapy  Today you received the following chemotherapy agents Zometa   BELOW ARE SYMPTOMS THAT SHOULD BE REPORTED IMMEDIATELY:  *FEVER GREATER THAN 100.5 F  *CHILLS WITH OR WITHOUT FEVER  NAUSEA AND VOMITING THAT IS NOT CONTROLLED WITH YOUR NAUSEA MEDICATION  *UNUSUAL SHORTNESS OF BREATH  *UNUSUAL BRUISING OR BLEEDING  TENDERNESS IN MOUTH AND THROAT WITH OR WITHOUT PRESENCE OF ULCERS  *URINARY PROBLEMS  *BOWEL PROBLEMS  UNUSUAL RASH Items with * indicate a potential emergency and should be followed up as soon as possible.  One of the nurses will contact you 24 hours after your treatment. Please let the nurse know about any problems that you may have experienced. Feel free to call the clinic you have any questions or concerns. The clinic phone number is [phone].   I have been informed and understand all the instructions given to me. I know to contact the clinic, my physician, or go to the Emergency Department if any problems should occur. I do not have any questions at this time, but understand that I may call the clinic during office hours   should I have any questions or need assistance in obtaining follow up care.    __________________________________________  _____________  __________ Signature of Patient or Authorized Representative            Date                   Time    __________________________________________ Nurse's Signature    

## 2011-08-10 NOTE — Progress Notes (Signed)
Subjective:    Patient ID: Rodney [name], male    DOB: [birth year], 71 y.o.   MRN: [id number]  HPI INR was increased Recently diagnosed with multiple myeloma with metastasis to the bone in the light of a history of prostate cancer He has been undergoing therapy at the cancer center and has pursued a second opinion   Review of Systems  Constitutional: Negative for fever and fatigue.  HENT: Negative for hearing loss, congestion, neck pain and postnasal drip.   Eyes: Negative for discharge, redness and visual disturbance.  Respiratory: Negative for cough, shortness of breath and wheezing.   Cardiovascular: Negative for leg swelling.  Gastrointestinal: Negative for abdominal pain, constipation and abdominal distention.  Genitourinary: Negative for urgency and frequency.  Musculoskeletal: Negative for joint swelling and arthralgias.  Skin: Negative for color change and rash.  Neurological: Negative for weakness and light-headedness.  Hematological: Negative for adenopathy.  Psychiatric/Behavioral: Negative for behavioral problems.   Past Medical History  Diagnosis Date  . Pulmonary embolism   . Pancreatitis   . Pulmonary embolism   . Complication of anesthesia 02-16-11    Pt. speaks of awakening during the surgery from back  surgery  . Shortness of breath 02-16-11    hx. Pulmonary emboli x2 (9 yrs/ 3'12 -last)  . Anemia 02-16-11    01-05-11- post surgery-Transfusions x 4 units  . Prostate cancer   . Multiple myeloma 02-16-11    suspected tumor  left sacral  . Degenerative disc disease 02-16-11    01-05-11 L3 fusion done  . Hernia 02-16-11    left inguinal hernia at present  . Multiple myeloma     History   Social History  . Marital Status: Married    Spouse Name: N/A    Number of Children: N/A  . Years of Education: N/A   Occupational History  . Not on file.   Social History Main Topics  . Smoking status: Former Smoker    Quit date: 02/15/1969  . Smokeless  tobacco: Never Used   Comment: quit 50 yrs  . Alcohol Use: No  . Drug Use: No  . Sexually Active: Yes   Other Topics Concern  . Not on file   Social History Narrative   MarriedRegular exercise-yes    Past Surgical History  Procedure Date  . Back surgery 02-16-11     01-05-11 Lumbar surgery L3(complicated by loss of blood volume)/ 01-09-11 then Lumbar fusion done with retained  hardware   . Cholecystectomy 04/06/2010    laparoscopic-inflammation with stones  . Inguinal hernia repair 02/20/2011    Procedure: HERNIA REPAIR INGUINAL ADULT;  Surgeon: Velora Heckler, MD;  Location: [facility];  Service: General;  Laterality: Left;  Repair Left Inguinal Hernia with Mesh  . Hernia repair 02/20/11    Inguinal hernia repair w/mesh    Family History  Problem Relation Age of Onset  . Lymphoma Father 70  . Cancer Father   . Clotting disorder Mother     blood clots    No Known Allergies  Current Outpatient Prescriptions on File Prior to Visit  Medication Sig Dispense Refill  . acetaminophen (TYLENOL) 500 MG tablet Take 1-2 tablets (500-1,000 mg total) by mouth every 6 (six) hours as needed for pain. For pain  30 tablet  1  . calcium carbonate (TUMS - DOSED IN MG ELEMENTAL CALCIUM) 500 MG chewable tablet Chew 1 tablet by mouth every 2 (two) hours as needed. HEART BURN        .  Calcium-Vitamin D (CALTRATE 600 PLUS-VIT D PO) Take 1 tablet by mouth every other day.        . lenalidomide (REVLIMID) 15 MG capsule Take 1 capsule (15 mg total) by mouth daily.  21 capsule  0  . methylcellulose (ARTIFICIAL TEARS) 1 % ophthalmic solution Place 2 drops into both eyes 2 (two) times daily as needed. DRY EYES       . Multiple Vitamins-Minerals (MULTIVITAMINS THER. W/MINERALS) TABS Take 1 tablet by mouth every other day.        Marland Kitchen omeprazole (PRILOSEC) 20 MG capsule Take 20 mg by mouth 2 (two) times daily as needed. HEART BURN       . traMADol (ULTRAM) 50 MG tablet TAKE ONE TABLET BY MOUTH EVERY 8 HOURS AS  NEEDED FOR PAIN  60 tablet  3  . warfarin (COUMADIN) 5 MG tablet TAKE ONE TABLET BY MOUTH DAILY.  90 tablet  0   No current facility-administered medications on file prior to visit.    BP 120/70  Pulse 72  Temp 98.2 F (36.8 C)  Resp 16  Ht 5\' 11"  (1.803 m)  Wt 218 lb (98.884 kg)  BMI 30.40 kg/m2        Objective:   Physical Exam  Nursing note and vitals reviewed. Constitutional: He appears well-developed and well-nourished.  HENT:  Head: Normocephalic and atraumatic.  Eyes: Conjunctivae are normal. Pupils are equal, round, and reactive to light.  Neck: Normal range of motion. Neck supple.  Cardiovascular: Normal rate and regular rhythm.   Pulmonary/Chest: Effort normal and breath sounds normal.  Abdominal: Soft. Bowel sounds are normal.          Assessment & Plan:  Discussion of ongoing anticoagulation in light of history of PE. It is apparent that the PE probably was precipitated by the cancer and the successful treatment of his cancer should reduce his risk of recurrent PE. He should complete a minimum of 6 months of anticoagulant therapy. He requires Lovenox around surgical procedures. His blood pressure is stable off of medications he has hypothyroidism and thyroid lab should be monitored

## 2011-08-10 NOTE — Progress Notes (Signed)
Hematology and Oncology Follow Up Visit  Rodney [name] [id number] [birth year] 71 y.o. 08/10/2011    HPI: Rodney [name] is a 71 year old [place] and gentleman with stage I free lambda light chain multiple myeloma diagnosed 01/05/2011 after long-standing history of plasmacytoma. He has received 3 doses of Zometa, last given on 07/13/2011, due for his next monthly dosing today.He initiated Revlimid 15 mg daily 3 weeks on one week off given in combination with dexamethasone 20 mg on day 1 during week 1, 2, and 3 as outlined by Dr. Dorothea Ogle at [facility].  Interim History:   [name] is seen today unaccompanied for followup pertaining to stage I free lambda light chain multiple myeloma for which he is now receiving Revlimid 15 mg daily 3 weeks on one week off given in combination with dexamethasone 20 mg on day one for week one 2 and 3 as outlined by Dr. Dorothea Ogle at [facility]. He started treatment 3 weeks ago. He is feeling well, he denies any unexplained fevers, chills, or night sweats. No shortness of breath or chest pain issues.  He denies any bleeding or bruising symptoms. He has no neuropathy symptoms.   A  detailed review of systems is otherwise noncontributory as noted below.  Review of Systems: Constitutional:  no weight loss, fever, night sweats and feels well Eyes: no complaints ENT: no complaints Cardiovascular: no chest pain or dyspnea on exertion Respiratory: no cough, shortness of breath, or wheezing Neurological: no TIA or stroke symptoms Dermatological: negative Gastrointestinal: no abdominal pain, change in bowel habits, or black or bloody stools Genito-Urinary: no dysuria, trouble voiding, or hematuria Hematological and Lymphatic: negative Breast: negative Musculoskeletal: positive for - joint pain following first dose of monthly Zometa. Remaining ROS negative.   Medications:   I have reviewed the patient's current medications.  Current  Outpatient Prescriptions  Medication Sig Dispense Refill  . acetaminophen (TYLENOL) 500 MG tablet Take 1-2 tablets (500-1,000 mg total) by mouth every 6 (six) hours as needed for pain. For pain  30 tablet  1  . calcium carbonate (TUMS - DOSED IN MG ELEMENTAL CALCIUM) 500 MG chewable tablet Chew 1 tablet by mouth every 2 (two) hours as needed. HEART BURN        . Calcium-Vitamin D (CALTRATE 600 PLUS-VIT D PO) Take 1 tablet by mouth every other day.        . lenalidomide (REVLIMID) 15 MG capsule Take 1 capsule (15 mg total) by mouth daily.  21 capsule  0  . methylcellulose (ARTIFICIAL TEARS) 1 % ophthalmic solution Place 2 drops into both eyes 2 (two) times daily as needed. DRY EYES       . Multiple Vitamins-Minerals (MULTIVITAMINS THER. W/MINERALS) TABS Take 1 tablet by mouth every other day.        Marland Kitchen omeprazole (PRILOSEC) 20 MG capsule Take 20 mg by mouth 2 (two) times daily as needed. HEART BURN       . traMADol (ULTRAM) 50 MG tablet Take 50 mg by mouth every 6 (six) hours as needed. For pain      . traMADol (ULTRAM) 50 MG tablet TAKE ONE TABLET BY MOUTH EVERY 8 HOURS AS NEEDED FOR PAIN  60 tablet  3  . warfarin (COUMADIN) 5 MG tablet TAKE ONE TABLET BY MOUTH DAILY.  90 tablet  0    Allergies:  No Known Allergies  Physical Exam: Filed Vitals:   08/10/11 0858  BP: 117/75  Pulse: 69  Temp: 97.5  F (36.4 C)    Body mass index is 30.25 kg/(m^2). Weight: 216 lbs. HEENT:  Sclerae anicteric, conjunctivae pink.  Oropharynx clear.  No mucositis or candidiasis.   Nodes:  No cervical, supraclavicular, or axillary lymphadenopathy palpated.  Lungs:  Clear to auscultation bilaterally.  No crackles, rhonchi, or wheezes.   Heart:  Regular rate and rhythm.   Abdomen:  Soft, nontender.  Positive bowel sounds.  No organomegaly or masses palpated.   Musculoskeletal:  No focal spinal tenderness to gentle palpation.  Extremities:  Benign.  No peripheral edema or cyanosis.   Skin:  Benign.   Neuro:   Nonfocal, alert and oriented x 3.   Lab Results: Lab Results  Component Value Date   WBC 6.0 08/10/2011   HGB 15.6 08/10/2011   HCT 45.1 08/10/2011   MCV 85.7 08/10/2011   PLT 161 08/10/2011   NEUTROABS 3.6 08/10/2011     Chemistry      Component Value Date/Time   NA 136 07/13/2011 1345   K 3.9 07/13/2011 1345   CL 106 07/13/2011 1345   CO2 16* 07/13/2011 1345   BUN 15 07/13/2011 1345   CREATININE 0.95 07/13/2011 1345   CREATININE 0.91 01/01/2011 1630      Component Value Date/Time   CALCIUM 8.2* 07/13/2011 1345   ALKPHOS 101 07/13/2011 1345   AST 30 07/13/2011 1345   ALT 45 07/13/2011 1345   BILITOT 0.4 07/13/2011 1345      No results found for this basename: LABCA2    Radiological Studies: Dg Chest 2 View 05/19/2011  *RADIOLOGY REPORT*  Clinical Data: Fever and cough  CHEST - 2 VIEW  Comparison: Chest radiograph 01/12/2011  Findings: Normal cardiac silhouette.  There is mild atelectasis of the lung bases which is similar to prior.  No focal consolidation. No pneumothorax. Degenerative osteophytosis of the thoracic spine.  IMPRESSION:  1.  No acute findings. 2.  Mild basilar atelectasis.  Original Report Authenticated By: Genevive Bi, M.D.   Nm Pet Image Restag (ps) Skull Base To Thigh 05/28/2011  *RADIOLOGY REPORT*  Clinical Data: Subsequent treatment strategy for multiple myeloma.  NUCLEAR MEDICINE PET CT RESTAGING (PS) SKULL BASE TO THIGH  Technique:  14.5 mCi F-18 FDG was injected intravenously via the right AC.  Full-ring PET imaging was performed from the skull base through the mid-thighs 60  minutes after injection.  CT data was obtained and used for attenuation correction and anatomic localization only.  (This was not acquired as a diagnostic CT examination.)  Fasting Blood Glucose:  79  Patient Weight:  210 pounds.  Comparison: Chest CT 07/24/2010.  CT of abdomen and pelvis 12/31/2010.  Findings:  Skull Base and Neck:  No abnormal hypermetabolic activity. No abnormal soft tissue masses  or lymphadenopathy.  There is a lytic lesion in the spinous process of C7, without any associated hypermetabolic activity.  Thorax:  No abnormal hypermetabolic activity. 4 mm nodule in the lateral segment of the right middle lobe (image 107 of series 2). There are no other larger more suspicious appearing pulmonary nodules or masses identified.  Heart size is mildly enlarged. There is no significant pericardial fluid, thickening or pericardial calcification.  There is atherosclerosis of the thoracic aorta, the great vessels of the mediastinum and the coronary arteries, including calcified atherosclerotic plaque in the left main, left anterior descending and right coronary arteries. No pathologically enlarged mediastinal or hilar lymph nodes. Calcified low right paratracheal lymph nodes incidentally noted.   A small lytic lesion  is noted in the lateral aspect of the left clavicle (image 56 of series 2), without any associated hypermetabolic activity on the PET portion of the examination.  Abdomen/Pelvis: The unenhanced appearance of the liver, pancreas, bilateral adrenal glands and bilateral kidneys is unremarkable. Mild bilateral perinephric stranding is noted, and is nonspecific (occasionally associated with renal insufficiency).  The patient is status post cholecystectomy.  Calcifications within the spleen are consistent with calcified granulomas.  No ascites or pneumoperitoneum and no pathologic distension of bowel.  No definite pathologic adenopathy identified within the abdomen or pelvis.  Postoperative changes of pelvic lymph node dissection are noted (particularly on the right).  Compared to the prior lumbar spine CT scan dated 12/31/2010, there has been interval placement of a bone graft within the vertebral body at L3, and fusion from L2-L4.  Hypermetabolic activity is noted within L3 (SUVmax = 4.7), as well as the posterior paraspinal soft tissues (SUVmax = 4.6).  There continues to be a large lytic lesion  in L3, with little incorporation of the bone graft.  A large lytic lesion is again noted in the left side of the sacrum, demonstrating hypermetabolic activity (SUVmax = 4.8).  Upper Thighs:  No abnormal hypermetabolic activity. No abnormal soft tissue masses or lymphadenopathy.  IMPRESSION: 1.  Bony changes compatible with multiple myeloma, with the most significant lesions in the left side of the sacrum, and in the L3 vertebral body, as detailed above.  There are also small lytic lesions in the C7 spinous process and the left distal clavicle. 2.  4 mm nodule in the lateral segment of the right middle lobe is nonspecific. If the patient is at high risk for bronchogenic carcinoma, follow-up chest CT at 1 year is recommended.  If the patient is at low risk, no follow-up is needed.  This recommendation follows the consensus statement: Guidelines for Management of Small Pulmonary Nodules Detected on CT Scans:  A Statement from the Fleischner Society as published in Radiology 2005; 237:395-400. 3. Atherosclerosis, including left main and two-vessel coronary artery disease. Please note that although the presence of coronary artery calcium documents the presence of coronary artery disease, the severity of this disease and any potential stenosis cannot be assessed on this non-gated CT examination.  Assessment for potential risk factor modification, dietary therapy or pharmacologic therapy may be warranted, if clinically indicated.  Original Report Authenticated By: Florencia Reasons, M.D.     Assessment:  Mr. Baskette is a 71 year old [place] and gentleman with stage I free lambda light chain multiple myeloma diagnosed 01/05/2011 after long-standing history of plasmacytoma. He has received 3 doses of Zometa, last given on 07/13/2011. He initiated Revlimid 15 mg daily 3 weeks on one week off given in combination with dexamethasone 20 mg on day 1 during week 1, 2, and 3 as outlined by Dr. Marissa Calamity at  [facility]. Excellent tolerance.  Case to be reviewed with Dr. Pierce Crane.  Plan:  Mr. Doyle will receive Zometa today as scheduled.  He will also continue on Revlimid at 15 mg daily for 3 weeks on one week off, he'll also continue dexamethasone 20 mg on a weekly basis for 3 weeks on one week off correlating with his Revlimid dosings. He'll return on 09/07/2011 for his next dose of Zometa. He knows to contact us sooner if the need should arise. He will also notify us of his next appointment with Dr. Marissa Calamity at [facility] with a has been made.  Ily Denno T, PA-C 08/10/2011

## 2011-08-10 NOTE — Patient Instructions (Addendum)
Hold one day and then resume at current dose Increase the calcium to twice a day

## 2011-08-14 LAB — COMPREHENSIVE METABOLIC PANEL WITH GFR
ALT: 36 U/L (ref 0–53)
AST: 25 U/L (ref 0–37)
Albumin: 4 g/dL (ref 3.5–5.2)
Alkaline Phosphatase: 86 U/L (ref 39–117)
BUN: 15 mg/dL (ref 6–23)
CO2: 19 meq/L (ref 19–32)
Calcium: 9.1 mg/dL (ref 8.4–10.5)
Chloride: 107 meq/L (ref 96–112)
Creatinine, Ser: 0.81 mg/dL (ref 0.50–1.35)
Glucose, Bld: 94 mg/dL (ref 70–99)
Potassium: 4.4 meq/L (ref 3.5–5.3)
Sodium: 137 meq/L (ref 135–145)
Total Bilirubin: 0.5 mg/dL (ref 0.3–1.2)
Total Protein: 6.4 g/dL (ref 6.0–8.3)

## 2011-08-14 LAB — IMMUNOFIXATION ELECTROPHORESIS
IgA: 358 mg/dL (ref 68–379)
IgG (Immunoglobin G), Serum: 868 mg/dL (ref 650–1600)

## 2011-08-14 LAB — KAPPA/LAMBDA LIGHT CHAINS
Kappa free light chain: 2.15 mg/dL — ABNORMAL HIGH (ref 0.33–1.94)
Lambda Free Lght Chn: 4.61 mg/dL — ABNORMAL HIGH (ref 0.57–2.63)

## 2011-08-31 ENCOUNTER — Ambulatory Visit (INDEPENDENT_AMBULATORY_CARE_PROVIDER_SITE_OTHER): Payer: Medicare Other | Admitting: Family

## 2011-08-31 DIAGNOSIS — Z86718 Personal history of other venous thrombosis and embolism: Secondary | ICD-10-CM

## 2011-08-31 NOTE — Patient Instructions (Signed)
Today 1/2 tablet. Then continue the same dose, 5 mg (1 pill). Everyday, Check in 3 weeks    Latest dosing instructions   Total Sun Mon Tue Wed Thu Fri Sat   35 5 mg 5 mg 5 mg 5 mg 5 mg 5 mg 5 mg    (5 mg1) (5 mg1) (5 mg1) (5 mg1) (5 mg1) (5 mg1) (5 mg1)

## 2011-09-07 ENCOUNTER — Other Ambulatory Visit: Payer: Self-pay

## 2011-09-07 ENCOUNTER — Telehealth: Payer: Self-pay | Admitting: *Deleted

## 2011-09-07 ENCOUNTER — Ambulatory Visit (HOSPITAL_BASED_OUTPATIENT_CLINIC_OR_DEPARTMENT_OTHER): Payer: Medicare Other | Admitting: Physician Assistant

## 2011-09-07 ENCOUNTER — Ambulatory Visit (HOSPITAL_BASED_OUTPATIENT_CLINIC_OR_DEPARTMENT_OTHER): Payer: Medicare Other

## 2011-09-07 ENCOUNTER — Other Ambulatory Visit: Payer: Self-pay | Admitting: *Deleted

## 2011-09-07 ENCOUNTER — Other Ambulatory Visit (HOSPITAL_BASED_OUTPATIENT_CLINIC_OR_DEPARTMENT_OTHER): Payer: Medicare Other | Admitting: Lab

## 2011-09-07 VITALS — BP 116/74 | HR 66 | Temp 98.8°F | Ht 71.0 in | Wt 220.3 lb

## 2011-09-07 DIAGNOSIS — C9000 Multiple myeloma not having achieved remission: Secondary | ICD-10-CM

## 2011-09-07 DIAGNOSIS — R252 Cramp and spasm: Secondary | ICD-10-CM

## 2011-09-07 LAB — CBC WITH DIFFERENTIAL/PLATELET
BASO%: 2.1 % — ABNORMAL HIGH (ref 0.0–2.0)
EOS%: 5.9 % (ref 0.0–7.0)
HCT: 45.7 % (ref 38.4–49.9)
MCH: 29.9 pg (ref 27.2–33.4)
MCHC: 33.6 g/dL (ref 32.0–36.0)
MONO%: 18.9 % — ABNORMAL HIGH (ref 0.0–14.0)
NEUT%: 62.2 % (ref 39.0–75.0)
lymph#: 0.7 10*3/uL — ABNORMAL LOW (ref 0.9–3.3)

## 2011-09-07 LAB — COMPREHENSIVE METABOLIC PANEL
ALT: 54 U/L — ABNORMAL HIGH (ref 0–53)
AST: 27 U/L (ref 0–37)
Alkaline Phosphatase: 97 U/L (ref 39–117)
Calcium: 8.5 mg/dL (ref 8.4–10.5)
Chloride: 106 mEq/L (ref 96–112)
Creatinine, Ser: 0.79 mg/dL (ref 0.50–1.35)
Total Bilirubin: 0.4 mg/dL (ref 0.3–1.2)

## 2011-09-07 MED ORDER — LENALIDOMIDE 15 MG PO CAPS: ORAL_CAPSULE | ORAL | Status: DC

## 2011-09-07 MED ORDER — ZOLEDRONIC ACID 4 MG/100ML IV SOLN
4.0000 mg | Freq: Once | INTRAVENOUS | Status: AC
Start: 2011-09-07 — End: 2011-09-07
  Administered 2011-09-07: 4 mg via INTRAVENOUS
  Filled 2011-09-07: qty 100

## 2011-09-07 MED ORDER — SODIUM CHLORIDE 0.9 % IV SOLN
Freq: Once | INTRAVENOUS | Status: AC
  Administered 2011-09-07: 12:00:00 via INTRAVENOUS

## 2011-09-07 NOTE — Telephone Encounter (Signed)
Made patient appointments for 10-05-2011 11-02-2011 11-30-2011 sent michelle email to set patient's treatments sent md email per orders from 09-07-2011 11-02-2011 the md is on PAL asked the md to advise the scheduling department

## 2011-09-07 NOTE — Telephone Encounter (Signed)
THIS REFILL REQUEST FOR REVLIMID WAS GIVEN TO DR.RUBIN'S NURSE, TIFFANY BRUCE,RN.

## 2011-09-07 NOTE — Progress Notes (Signed)
Hematology and Oncology Follow Up Visit  Rodney [name] [id number] [birth year] 71 y.o. 09/07/2011    HPI: Rodney [name] is a 71 year old [place] and gentleman with stage I free lambda light chain multiple myeloma diagnosed 01/05/2011 after long-standing history of plasmacytoma. He has received 3 doses of Zometa, last given on 07/13/2011, due for his next monthly dosing today.He initiated Revlimid 15 mg daily 3 weeks on one week off given in combination with dexamethasone 20 mg on day 1 during week 1, 2, and 3 as outlined by Dr. Dorothea Ogle at [facility].  Interim History:   Rodney [name] is seen today with his wife in accompaniment for followup pertaining to stage I free lambda light chain multiple myeloma for which he is now receiving Revlimid 15 mg daily 3 weeks on one week off given in combination with dexamethasone 20 mg on day one for week one 2 and 3 as outlined by Dr. Dorothea Ogle at [facility]. He notes, that he will be scheduled for stem cell harvesting after the fourth cycle has been completed, tentatively in mid August 2013. He is feeling well, he denies any unexplained fevers, chills, or night sweats. No shortness of breath or chest pain issues.  He denies any bleeding or bruising symptoms. He has no neuropathy symptoms. He does describe some muscle "cramping" primarily in his left upper back region. He did seem to precipitate after carrying heavy watermelon. He denies acute pain, more chronic in nature at this point. Percocet has been noneffective.  A  detailed review of systems is otherwise noncontributory as noted below.  Review of Systems: Constitutional:  no weight loss, fever, night sweats and feels well Eyes: no complaints ENT: no complaints Cardiovascular: no chest pain or dyspnea on exertion Respiratory: no cough, shortness of breath, or wheezing Neurological: no TIA or stroke symptoms Dermatological: negative Gastrointestinal: no abdominal pain, change in  bowel habits, or black or bloody stools Genito-Urinary: no dysuria, trouble voiding, or hematuria Hematological and Lymphatic: negative Breast: negative Musculoskeletal: positive for - joint pain following first dose of monthly Zometa. Remaining ROS negative.   Medications:   I have reviewed the Rodney [name]'s current medications.  Current Outpatient Prescriptions  Medication Sig Dispense Refill  . acetaminophen (TYLENOL) 500 MG tablet Take 1-2 tablets (500-1,000 mg total) by mouth every 6 (six) hours as needed for pain. For pain  30 tablet  1  . calcium carbonate (TUMS - DOSED IN MG ELEMENTAL CALCIUM) 500 MG chewable tablet Chew 1 tablet by mouth every 2 (two) hours as needed. HEART BURN        . Calcium-Vitamin D (CALTRATE 600 PLUS-VIT D PO) Take 1 tablet by mouth every other day.        . lenalidomide (REVLIMID) 15 MG capsule Take 1 capsule (15 mg total) by mouth daily.  21 capsule  0  . methylcellulose (ARTIFICIAL TEARS) 1 % ophthalmic solution Place 2 drops into both eyes 2 (two) times daily as needed. DRY EYES       . Multiple Vitamins-Minerals (MULTIVITAMINS THER. W/MINERALS) TABS Take 1 tablet by mouth every other day.        Marland Kitchen omeprazole (PRILOSEC) 20 MG capsule Take 20 mg by mouth 2 (two) times daily as needed. HEART BURN       . traMADol (ULTRAM) 50 MG tablet Take 1 tablet (50 mg total) by mouth every 6 (six) hours as needed for pain.  60 tablet  3  . warfarin (COUMADIN) 5 MG tablet  TAKE ONE TABLET BY MOUTH DAILY.  90 tablet  0    Allergies:  No Known Allergies  Physical Exam: Filed Vitals:   09/07/11 1024  BP: 116/74  Pulse: 66  Temp: 98.8 F (37.1 C)    Body mass index is 30.73 kg/(m^2). Weight: 220 lbs. HEENT:  Sclerae anicteric, conjunctivae pink.  Oropharynx clear.  No mucositis or candidiasis.   Nodes:  No cervical, supraclavicular, or axillary lymphadenopathy palpated.  Lungs:  Clear to auscultation bilaterally.  No crackles, rhonchi, or wheezes.  Region just  medial to the left scapula is the area of sensitivity. He does feel like he has a bit of according, no evidence of skin rashes, no vertebral tenderness in this region. Heart:  Regular rate and rhythm.   Abdomen:  Soft, nontender.  Positive bowel sounds.  No organomegaly or masses palpated.   Musculoskeletal:  No focal spinal tenderness to gentle palpation.  Extremities:  Benign.  No peripheral edema or cyanosis.   Skin:  Benign.   Neuro:  Nonfocal, alert and oriented x 3.   Lab Results: Lab Results  Component Value Date   WBC 6.0 08/10/2011   HGB 15.6 08/10/2011   HCT 45.1 08/10/2011   MCV 85.7 08/10/2011   PLT 161 08/10/2011   NEUTROABS 3.6 08/10/2011     Chemistry      Component Value Date/Time   NA 137 08/10/2011 0911   K 4.4 08/10/2011 0911   CL 107 08/10/2011 0911   CO2 19 08/10/2011 0911   BUN 15 08/10/2011 0911   CREATININE 0.81 08/10/2011 0911   CREATININE 0.91 01/01/2011 1630      Component Value Date/Time   CALCIUM 9.1 08/10/2011 0911   ALKPHOS 86 08/10/2011 0911   AST 25 08/10/2011 0911   ALT 36 08/10/2011 0911   BILITOT 0.5 08/10/2011 0911      No results found for this basename: LABCA2     Assessment:  Rodney [name] is a 71 year old [place] and gentleman with stage I free lambda light chain multiple myeloma diagnosed 01/05/2011 after long-standing history of plasmacytoma. He has received 3 doses of Zometa, last given on 07/13/2011. He initiated Revlimid 15 mg daily 3 weeks on one week off given in combination with dexamethasone 20 mg on day 1 during week 1, 2, and 3 as outlined by Dr. Marissa Calamity at [facility], having currently completed 3 cycles. Excellent tolerance.  2. Reported muscle cramping, without evidence of peripheral neuropathy.  Case  reviewed with Dr. Pierce Crane, who also spoke with Rodney [name] and his wife.  Plan:  Rodney [name] will receive Zometa today as scheduled, with IV access we will obtain a CBC, STAT serum chemistry, magnesium level, and protein  studies. He will also continue on Revlimid at 15 mg daily for 3 weeks on one week off, he'll also continue dexamethasone 20 mg on a weekly basis for 3 weeks on one week off correlating with his Revlimid dosings. He'll return on 10/05/2011 for his next dose of Zometa. He knows to contact us sooner if the need should arise.   Rodney Palka T, PA-C 09/07/2011

## 2011-09-10 ENCOUNTER — Encounter: Payer: Self-pay | Admitting: *Deleted

## 2011-09-10 ENCOUNTER — Telehealth: Payer: Self-pay | Admitting: *Deleted

## 2011-09-10 NOTE — Progress Notes (Signed)
RECEIVED A FAX FROM [facility] CONCERNING A CONFIRMATION OF PRESCRIPTION SHIPMENT FOR REVLIMID. 

## 2011-09-10 NOTE — Telephone Encounter (Signed)
Per staff message I have scheduled appts. JMW  

## 2011-09-11 LAB — IMMUNOFIXATION ELECTROPHORESIS
IgA: 331 mg/dL (ref 68–379)
IgM, Serum: 133 mg/dL (ref 41–251)

## 2011-09-11 LAB — KAPPA/LAMBDA LIGHT CHAINS
Kappa:Lambda Ratio: 0.5 (ref 0.26–1.65)
Lambda Free Lght Chn: 4.11 mg/dL — ABNORMAL HIGH (ref 0.57–2.63)

## 2011-09-21 ENCOUNTER — Ambulatory Visit (INDEPENDENT_AMBULATORY_CARE_PROVIDER_SITE_OTHER): Payer: Medicare Other | Admitting: Family

## 2011-09-21 DIAGNOSIS — Z86718 Personal history of other venous thrombosis and embolism: Secondary | ICD-10-CM

## 2011-09-21 LAB — POCT INR: INR: 2.4

## 2011-09-21 NOTE — Patient Instructions (Signed)
Continue the same dose, 5 mg (1 pill). Everyday, Check in 4 weeks    Latest dosing instructions   Total Sun Mon Tue Wed Thu Fri Sat   35 5 mg 5 mg 5 mg 5 mg 5 mg 5 mg 5 mg    (5 mg1) (5 mg1) (5 mg1) (5 mg1) (5 mg1) (5 mg1) (5 mg1)

## 2011-09-24 ENCOUNTER — Telehealth: Payer: Self-pay | Admitting: Internal Medicine

## 2011-09-24 MED ORDER — ENOXAPARIN SODIUM 80 MG/0.8ML ~~LOC~~ SOLN: 80.0000 mg | Freq: Every day | SUBCUTANEOUS | Status: DC

## 2011-09-24 NOTE — Telephone Encounter (Signed)
Caller: Margaret/Spouse; Phone Number: [phone]; Message from caller: Wife states her husband is scheduled for a bone marrow bx in [place] on 10/03/11 and the doctor wants him to be off Coumadin 3days prior to procedure.  Wife wants to know if Dr. Lovell Sheehan wants to switch him to Lovenox.

## 2011-09-24 NOTE — Telephone Encounter (Signed)
Hold coumadin 3 days prior to procedure and start coumadin day after surgery-take lovonex for the 3 days prior and for 2 days after along with coumadin

## 2011-09-24 NOTE — Telephone Encounter (Signed)
lovenox ordered

## 2011-09-28 ENCOUNTER — Telehealth: Payer: Self-pay | Admitting: Internal Medicine

## 2011-09-28 MED ORDER — ENOXAPARIN SODIUM 100 MG/ML ~~LOC~~ SOLN: 100.0000 mg | Freq: Every day | SUBCUTANEOUS | Status: DC

## 2011-09-28 NOTE — Telephone Encounter (Signed)
Per dr Lovell Sheehan- change to 100 mg- called and sent by escribe to pharmacy

## 2011-09-28 NOTE — Telephone Encounter (Signed)
Pt spouse called re: the enoxaparin (LOVENOX) 80 MG/0.8ML injection. Pts spouse spouse to nurse with Dr Lorine Bears in [facility] and was told that the Lovenox should be 100 mg not 80 mg. Pls change the script to 100mg  and call in to [facility] on [address].  Pt taking last coumadin tomorrow and is suppose to start Lovenox on Sunday 09/30/11.

## 2011-10-05 ENCOUNTER — Ambulatory Visit (HOSPITAL_BASED_OUTPATIENT_CLINIC_OR_DEPARTMENT_OTHER): Payer: Medicare Other

## 2011-10-05 ENCOUNTER — Ambulatory Visit (HOSPITAL_BASED_OUTPATIENT_CLINIC_OR_DEPARTMENT_OTHER): Payer: Medicare Other | Admitting: Family

## 2011-10-05 ENCOUNTER — Telehealth: Payer: Self-pay | Admitting: *Deleted

## 2011-10-05 ENCOUNTER — Other Ambulatory Visit (HOSPITAL_BASED_OUTPATIENT_CLINIC_OR_DEPARTMENT_OTHER): Payer: Medicare Other

## 2011-10-05 VITALS — BP 121/82 | HR 71 | Temp 98.3°F | Resp 20 | Ht 71.0 in | Wt 224.3 lb

## 2011-10-05 DIAGNOSIS — C801 Malignant (primary) neoplasm, unspecified: Secondary | ICD-10-CM

## 2011-10-05 DIAGNOSIS — C9000 Multiple myeloma not having achieved remission: Secondary | ICD-10-CM

## 2011-10-05 LAB — CBC WITH DIFFERENTIAL/PLATELET
BASO%: 2.2 % — ABNORMAL HIGH (ref 0.0–2.0)
Eosinophils Absolute: 0.2 10*3/uL (ref 0.0–0.5)
HCT: 41.9 % (ref 38.4–49.9)
LYMPH%: 16.1 % (ref 14.0–49.0)
MONO#: 1.2 10*3/uL — ABNORMAL HIGH (ref 0.1–0.9)
NEUT#: 3.3 10*3/uL (ref 1.5–6.5)
NEUT%: 57.2 % (ref 39.0–75.0)
Platelets: 160 10*3/uL (ref 140–400)
WBC: 5.8 10*3/uL (ref 4.0–10.3)
lymph#: 0.9 10*3/uL (ref 0.9–3.3)

## 2011-10-05 LAB — COMPREHENSIVE METABOLIC PANEL
ALT: 44 U/L (ref 0–53)
CO2: 23 mEq/L (ref 19–32)
Calcium: 8.7 mg/dL (ref 8.4–10.5)
Chloride: 104 mEq/L (ref 96–112)
Creatinine, Ser: 0.83 mg/dL (ref 0.50–1.35)
Glucose, Bld: 84 mg/dL (ref 70–99)
Sodium: 137 mEq/L (ref 135–145)
Total Bilirubin: 0.6 mg/dL (ref 0.3–1.2)
Total Protein: 6.4 g/dL (ref 6.0–8.3)

## 2011-10-05 LAB — MAGNESIUM: Magnesium: 2 mg/dL (ref 1.5–2.5)

## 2011-10-05 MED ORDER — SODIUM CHLORIDE 0.9 % IV SOLN
INTRAVENOUS | Status: DC
  Administered 2011-10-05: 16:00:00 via INTRAVENOUS

## 2011-10-05 MED ORDER — ZOLEDRONIC ACID 4 MG/100ML IV SOLN
4.0000 mg | Freq: Once | INTRAVENOUS | Status: AC
  Administered 2011-10-05: 4 mg via INTRAVENOUS
  Filled 2011-10-05: qty 100

## 2011-10-05 NOTE — Telephone Encounter (Signed)
per orders from 10-05-2011

## 2011-10-05 NOTE — Patient Instructions (Signed)
[facility] Discharge Instructions for Patients Receiving Chemotherapy  Today you received the following chemotherapy agents Zometa.  To help prevent nausea and vomiting after your treatment, we encourage you to take your nausea medication as needed.  If you develop nausea and vomiting that is not controlled by your nausea medication, call the clinic. If it is after clinic hours your family physician or the after hours number for the clinic or go to the Emergency Department.   BELOW ARE SYMPTOMS THAT SHOULD BE REPORTED IMMEDIATELY:  *FEVER GREATER THAN 100.5 F  *CHILLS WITH OR WITHOUT FEVER  NAUSEA AND VOMITING THAT IS NOT CONTROLLED WITH YOUR NAUSEA MEDICATION  *UNUSUAL SHORTNESS OF BREATH  *UNUSUAL BRUISING OR BLEEDING  TENDERNESS IN MOUTH AND THROAT WITH OR WITHOUT PRESENCE OF ULCERS  *URINARY PROBLEMS  *BOWEL PROBLEMS  UNUSUAL RASH Items with * indicate a potential emergency and should be followed up as soon as possible.  One of the nurses will contact you 24 hours after your treatment. Please let the nurse know about any problems that you may have experienced. Feel free to call the clinic you have any questions or concerns. The clinic phone number is [phone].   I have been informed and understand all the instructions given to me. I know to contact the clinic, my physician, or go to the Emergency Department if any problems should occur. I do not have any questions at this time, but understand that I may call the clinic during office hours   should I have any questions or need assistance in obtaining follow up care.    __________________________________________  _____________  __________ Signature of Patient or Authorized Representative            Date                   Time    __________________________________________ Nurse's Signature

## 2011-10-05 NOTE — Progress Notes (Signed)
1520--Unable to draw all labs from IV, patient states he had multiple labs drawn on Wednesday at [facility], spoke with Johnny Bridge, BMT RN at [facility], she will fax [place] all labs from 10/03/11. Spoke with Colman Cater, PA with Dr Donnie Coffin, ok since patient is returning in 3months. Patient knows to call us sooner if BMT does not progress as planned.

## 2011-10-08 ENCOUNTER — Encounter: Payer: Self-pay | Admitting: Family

## 2011-10-08 NOTE — Progress Notes (Signed)
Hematology and Oncology Follow Up Visit  Rodney [name] [id number] [birth year] 71 y.o. 10/08/2011    HPI: Rodney [name] is a 71 year old [place] and gentleman with stage I free lambda light chain multiple myeloma diagnosed 01/05/2011 after long-standing history of plasmacytoma. Currently on Zometa, last given on 75/07/2011, due for his next monthly dosing today.He initiated Revlimid 15 mg daily 3 weeks on one week off given in combination with dexamethasone 20 mg on day 1 during week 1, 2, and 3 as outlined by Dr. Dorothea [name] at [facility].  Interim History:   Rodney [name] is seen today with his wife in accompaniment for followup pertaining to stage I free lambda light chain multiple myeloma for which he is now receiving Revlimid 15 mg daily 3 weeks on one week off given in combination with dexamethasone 20 mg on day one for week one 2 and 3 as outlined by Dr. Dorothea [name] at [facility]. Was seen last week and will have stem cell harvesting in the next 2 weeks. He is feeling well, denies any unexplained fevers, chills, or night sweats. No shortness of breath or chest pain issues. Is off Coumadin, on Lovenox per [facility] in anticipation of stem cell harvest. He denies any bleeding or bruising symptoms. He has no neuropathy symptoms. Muscle "cramping" primarily in his left upper back region described on previous visit has resolved. Denies acute pain, more chronic in nature at this point. Percocet has been noneffective.  Medications:   I have reviewed the patient's current medications.  Current Outpatient Prescriptions  Medication Sig Dispense Refill  . acetaminophen (TYLENOL) 500 MG tablet Take 1-2 tablets (500-1,000 mg total) by mouth every 6 (six) hours as needed for pain. For pain  30 tablet  1  . calcium carbonate (TUMS - DOSED IN MG ELEMENTAL CALCIUM) 500 MG chewable tablet Chew 1 tablet by mouth every 2 (two) hours as needed. HEART BURN        . Calcium-Vitamin D (CALTRATE 600  PLUS-VIT D PO) Take 1 tablet by mouth every other day.        . enoxaparin (LOVENOX) 100 MG/ML injection Inject 1 mL (100 mg total) into the skin daily.  5 Syringe  0  . enoxaparin (LOVENOX) 40 MG/0.4ML SOLN Inject into the skin daily.        Marland Kitchen lenalidomide (REVLIMID) 15 MG capsule Take 1 capsule by mouth daily for 21 days on, then 7 days off  21 capsule  0  . methylcellulose (ARTIFICIAL TEARS) 1 % ophthalmic solution Place 2 drops into both eyes 2 (two) times daily as needed. DRY EYES       . Multiple Vitamins-Minerals (MULTIVITAMINS THER. W/MINERALS) TABS Take 1 tablet by mouth every other day.        Marland Kitchen omeprazole (PRILOSEC) 20 MG capsule Take 20 mg by mouth 2 (two) times daily as needed. HEART BURN       . traMADol (ULTRAM) 50 MG tablet Take 1 tablet (50 mg total) by mouth every 6 (six) hours as needed for pain.  60 tablet  3  . warfarin (COUMADIN) 5 MG tablet TAKE ONE TABLET BY MOUTH DAILY.  90 tablet  0    Allergies:  No Known Allergies  Physical Exam: Filed Vitals:   10/05/11 1426  BP: 121/82  Pulse: 71  Temp: 98.3 F (36.8 C)  Resp: 20    Body mass index is 31.28 kg/(m^2). Weight: 220 lbs. HEENT:  Sclerae anicteric, conjunctivae pink.  Oropharynx clear.  No mucositis or candidiasis.   Nodes:  No cervical, supraclavicular, or axillary lymphadenopathy palpated.  Lungs:  Clear to auscultation bilaterally.  No crackles, rhonchi, or wheezes.  Region just medial to the left scapula is the area of sensitivity. He does feel like he has a bit of according, no evidence of skin rashes, no vertebral tenderness in this region. Heart:  Regular rate and rhythm.   Abdomen:  Soft, nontender.  Positive bowel sounds.  No organomegaly or masses palpated.   Musculoskeletal:  No focal spinal tenderness to gentle palpation.  Extremities:  Benign.  No peripheral edema or cyanosis.   Skin:  Benign.   Neuro:  Nonfocal, alert and oriented x 3.   Lab Results: Lab Results  Component Value Date   WBC  5.8 10/05/2011   HGB 14.7 10/05/2011   HCT 41.9 10/05/2011   MCV 86.0 10/05/2011   PLT 160 10/05/2011   NEUTROABS 3.3 10/05/2011     Chemistry      Component Value Date/Time   NA 137 10/05/2011 1419   K 3.9 10/05/2011 1419   CL 104 10/05/2011 1419   CO2 23 10/05/2011 1419   BUN 13 10/05/2011 1419   CREATININE 0.83 10/05/2011 1419   CREATININE 0.91 01/01/2011 1630      Component Value Date/Time   CALCIUM 8.7 10/05/2011 1419   ALKPHOS 83 10/05/2011 1419   AST 34 10/05/2011 1419   ALT 44 10/05/2011 1419   BILITOT 0.6 10/05/2011 1419      Assessment:  Rodney [name] is a 71 year old [place] and gentleman with stage I free lambda light chain multiple myeloma diagnosed 01/05/2011 after long-standing history of plasmacytoma. Receiving Zometa, last given on 09/09/2011. Future treatments after today on hold for now per Dr. Marissa Calamity at [facility] in anticipation of stem cell harvest in 2 weeks. We will proceed with Zometa today.    Plan:  Rodney [name] will receive Zometa today as scheduled. Labs are being monitored at [facility]. Appointments and treatments for the next 3 months are on hold per patients instructions. He prefers to call for return appt at appropriate time when released from [facility].   Colman Cater, FNP-C 10/08/2011

## 2011-10-08 NOTE — Patient Instructions (Signed)
Proceed with transplant per [facility].

## 2011-10-19 ENCOUNTER — Encounter: Payer: Medicare Other | Admitting: Family

## 2011-10-29 ENCOUNTER — Telehealth: Payer: Self-pay | Admitting: Internal Medicine

## 2011-10-29 NOTE — Telephone Encounter (Signed)
Caller: Chip/Patient; Patient Name: Rodney [name]; PCP: Darryll Capers (Adults only); Best Callback Phone Number: [phone]; Call regarding Nausea, Headache and Insomnia, onset week of 8-19.  Patient states this symptoms started after Stem Cell removal week of 8-19, procedure done at [facility].  Patient awaiting transplant, Cancer Patient.  Patient is getting about 1 to 2 hours of sleep per night for 1 week.  All emergent symptoms ruled out per Sleep Disorders Protocol.  Appointment scheduled with Dr Clent Ridges at 1030 on 8-27 due to insomnia resulting in less than 4 hours sleep per night.  Patient verbalized understanding.

## 2011-10-30 ENCOUNTER — Encounter: Payer: Self-pay | Admitting: Family Medicine

## 2011-10-30 ENCOUNTER — Ambulatory Visit (INDEPENDENT_AMBULATORY_CARE_PROVIDER_SITE_OTHER): Payer: Medicare Other | Admitting: Family Medicine

## 2011-10-30 VITALS — BP 112/78 | HR 87 | Temp 98.2°F | Wt 219.0 lb

## 2011-10-30 DIAGNOSIS — G4700 Insomnia, unspecified: Secondary | ICD-10-CM

## 2011-10-30 MED ORDER — TEMAZEPAM 30 MG PO CAPS: 30.0000 mg | ORAL_CAPSULE | Freq: Every evening | ORAL | Status: DC | PRN

## 2011-10-30 NOTE — Progress Notes (Signed)
  Subjective:    Patient ID: Rodney [name], male    DOB: 04/04/1940, 71 y.o.   MRN: [id number]  HPI Here to ask for help with sleep. He is getting stem cell therapy in [facility] for multiple myeloma. He has been under a fair amount of stress and he has trouble sleeping at night. He declines any sort of therapy for the anxiety but he does want to try something for sleep.    Review of Systems  Constitutional: Negative.   Psychiatric/Behavioral: Positive for disturbed wake/sleep cycle. Negative for hallucinations, behavioral problems, confusion, dysphoric mood, decreased concentration and agitation. The patient is nervous/anxious. The patient is not hyperactive.        Objective:   Physical Exam  Constitutional: He is oriented to person, place, and time. He appears well-developed and well-nourished.  Neurological: He is alert and oriented to person, place, and time.  Psychiatric: He has a normal mood and affect. His behavior is normal. Thought content normal.          Assessment & Plan:  Try Temazepam as needed. Follow up with Dr. Lovell Sheehan.

## 2011-10-31 ENCOUNTER — Other Ambulatory Visit: Payer: Medicare Other | Admitting: Lab

## 2011-10-31 ENCOUNTER — Ambulatory Visit: Payer: Medicare Other | Admitting: Oncology

## 2011-10-31 ENCOUNTER — Ambulatory Visit: Payer: Medicare Other

## 2011-11-01 ENCOUNTER — Other Ambulatory Visit: Payer: Self-pay | Admitting: Emergency Medicine

## 2011-11-19 ENCOUNTER — Telehealth: Payer: Self-pay | Admitting: Family Medicine

## 2011-11-19 NOTE — Telephone Encounter (Signed)
Please clarify order

## 2011-11-19 NOTE — Telephone Encounter (Signed)
[facility] Triage Call Report Triage Record Num: [id number] Operator: Albertine Grates Patient Name: Rodney [name] Call Date & Time: 11/16/2011 7:32:08PM Patient Phone: [phone] PCP: Darryll Capers Patient Gender: Male PCP Fax : [phone] Patient DOB: December 24, 1940 Practice Name: [facility] Reason for Call: Caller: [name]/Patient; PCP: Darryll Capers; CB#: [phone]; Call regarding Medication Issue; Medication(s): enoxaparin sodium injection; States is "running out of Lovenox" and is needing refill. Has taken medicine 9-13. Takes 100mg  twice daily. Has enough to do until 9-14. Dr. Caryl Never notified and advised call in Lovenox 100mg  twice daily #7 and called to [phone]. Protocol(s) Used: Office Note Recommended Outcome per Protocol: Information Noted and Sent to Office Reason for Outcome: Caller information to office Care Advice: ~ 09/

## 2011-11-19 NOTE — Telephone Encounter (Signed)
Talked with pharmacy and th is was called in on 7-14 and has been picked up

## 2011-11-19 NOTE — Telephone Encounter (Signed)
Rodney [name] - I closed by mistake. Per the note, it sounds like Dr. B did call this in, but I don't think so, based on chart. Please advise. Thanks.

## 2011-11-19 NOTE — Telephone Encounter (Signed)
I had the nurse on call send in enough medication to get him through this Tuesday

## 2011-11-27 ENCOUNTER — Encounter: Payer: Self-pay | Admitting: Internal Medicine

## 2011-11-30 ENCOUNTER — Other Ambulatory Visit: Payer: Medicare Other | Admitting: Lab

## 2011-11-30 ENCOUNTER — Ambulatory Visit: Payer: Medicare Other

## 2011-11-30 ENCOUNTER — Ambulatory Visit: Payer: Medicare Other | Admitting: Oncology

## 2011-12-07 ENCOUNTER — Ambulatory Visit: Payer: Medicare Other | Admitting: Internal Medicine

## 2011-12-24 ENCOUNTER — Other Ambulatory Visit: Payer: Self-pay | Admitting: *Deleted

## 2011-12-24 ENCOUNTER — Other Ambulatory Visit (HOSPITAL_BASED_OUTPATIENT_CLINIC_OR_DEPARTMENT_OTHER): Payer: Medicare Other | Admitting: Lab

## 2011-12-24 ENCOUNTER — Other Ambulatory Visit: Payer: Medicare Other

## 2011-12-24 ENCOUNTER — Telehealth: Payer: Self-pay | Admitting: *Deleted

## 2011-12-24 ENCOUNTER — Ambulatory Visit (HOSPITAL_BASED_OUTPATIENT_CLINIC_OR_DEPARTMENT_OTHER): Payer: Medicare Other | Admitting: Lab

## 2011-12-24 DIAGNOSIS — C9000 Multiple myeloma not having achieved remission: Secondary | ICD-10-CM

## 2011-12-24 DIAGNOSIS — Z7901 Long term (current) use of anticoagulants: Secondary | ICD-10-CM

## 2011-12-24 DIAGNOSIS — C9030 Solitary plasmacytoma not having achieved remission: Secondary | ICD-10-CM

## 2011-12-24 DIAGNOSIS — C801 Malignant (primary) neoplasm, unspecified: Secondary | ICD-10-CM

## 2011-12-24 LAB — BASIC METABOLIC PANEL (CC13)
Chloride: 109 mEq/L — ABNORMAL HIGH (ref 98–107)
Creatinine: 1.6 mg/dL — ABNORMAL HIGH (ref 0.7–1.3)
Potassium: 4.2 mEq/L (ref 3.5–5.1)

## 2011-12-24 LAB — CBC WITH DIFFERENTIAL/PLATELET
Basophils Absolute: 0.2 10*3/uL — ABNORMAL HIGH (ref 0.0–0.1)
EOS%: 2.5 % (ref 0.0–7.0)
HGB: 12.8 g/dL — ABNORMAL LOW (ref 13.0–17.1)
MCH: 31.7 pg (ref 27.2–33.4)
MONO%: 19 % — ABNORMAL HIGH (ref 0.0–14.0)
NEUT#: 4.7 10*3/uL (ref 1.5–6.5)
RBC: 4.04 10*6/uL — ABNORMAL LOW (ref 4.20–5.82)
RDW: 16.3 % — ABNORMAL HIGH (ref 11.0–14.6)
lymph#: 1.6 10*3/uL (ref 0.9–3.3)
nRBC: 0 % (ref 0–0)

## 2011-12-24 LAB — HEPATIC FUNCTION PANEL
ALT: 39 U/L (ref 0–53)
AST: 21 U/L (ref 0–37)
Albumin: 4 g/dL (ref 3.5–5.2)
Bilirubin, Direct: 0.1 mg/dL (ref 0.0–0.3)
Total Protein: 6.5 g/dL (ref 6.0–8.3)

## 2011-12-24 LAB — PROTIME-INR
INR: 2 (ref 2.00–3.50)
Protime: 24 Seconds — ABNORMAL HIGH (ref 10.6–13.4)

## 2011-12-24 NOTE — Telephone Encounter (Signed)
01-11-2012 and 01-18-2012 lab only at 11:15am

## 2011-12-27 ENCOUNTER — Other Ambulatory Visit (HOSPITAL_BASED_OUTPATIENT_CLINIC_OR_DEPARTMENT_OTHER): Payer: Medicare Other

## 2011-12-27 DIAGNOSIS — C801 Malignant (primary) neoplasm, unspecified: Secondary | ICD-10-CM

## 2011-12-27 DIAGNOSIS — C9000 Multiple myeloma not having achieved remission: Secondary | ICD-10-CM

## 2011-12-27 DIAGNOSIS — Z7901 Long term (current) use of anticoagulants: Secondary | ICD-10-CM

## 2011-12-27 LAB — CBC WITH DIFFERENTIAL/PLATELET
BASO%: 1.3 % (ref 0.0–2.0)
Eosinophils Absolute: 0.2 10*3/uL (ref 0.0–0.5)
MCHC: 34.4 g/dL (ref 32.0–36.0)
MONO#: 1.7 10*3/uL — ABNORMAL HIGH (ref 0.1–0.9)
NEUT#: 6 10*3/uL (ref 1.5–6.5)
Platelets: 256 10*3/uL (ref 140–400)
RBC: 4.07 10*6/uL — ABNORMAL LOW (ref 4.20–5.82)
RDW: 16.9 % — ABNORMAL HIGH (ref 11.0–14.6)
WBC: 9.6 10*3/uL (ref 4.0–10.3)
lymph#: 1.6 10*3/uL (ref 0.9–3.3)

## 2011-12-27 LAB — COMPREHENSIVE METABOLIC PANEL (CC13)
AST: 19 U/L (ref 5–34)
Albumin: 3.6 g/dL (ref 3.5–5.0)
Alkaline Phosphatase: 132 U/L (ref 40–150)
Glucose: 96 mg/dl (ref 70–99)
Potassium: 4.1 mEq/L (ref 3.5–5.1)
Sodium: 137 mEq/L (ref 136–145)
Total Bilirubin: 0.2 mg/dL (ref 0.20–1.20)
Total Protein: 6.8 g/dL (ref 6.4–8.3)

## 2011-12-27 LAB — PROTIME-INR
INR: 3 (ref 2.00–3.50)
Protime: 36 Seconds — ABNORMAL HIGH (ref 10.6–13.4)

## 2011-12-31 ENCOUNTER — Other Ambulatory Visit: Payer: Medicare Other | Admitting: Lab

## 2012-01-02 ENCOUNTER — Telehealth: Payer: Self-pay | Admitting: Internal Medicine

## 2012-01-02 NOTE — Telephone Encounter (Signed)
Talked with pt and it appears dr Donnie Coffin is ordering this and since h is appointment is with dr Donnie Coffin on 11- talk with  Him at that time or call h is office today--we have not drawn inr since July in this office

## 2012-01-02 NOTE — Telephone Encounter (Signed)
Bonnye, sending this to you. It is about Coumadin and INR, but it does not look like Padonda has been managing this pt's Coumadin. Thanks!

## 2012-01-02 NOTE — Telephone Encounter (Signed)
Caller: Terance/Patient; Patient Name: Rodney [name]; PCP: Darryll Capers (Adults only); Best Callback Phone Number: [phone].  Patient calling about lab results/INR.  Had blood drawn at [facility] 12/27/11.  States results were 3.0.  Has been climbing over past 3 weeks.  Has appt 01/04/12 but wants to know what the parameters are and plan, before then, as he is quite anxious about this.  Denies problems with bleeding.  Takes coumadin 5mg  daily.   Has not received a call asking him to change his coumadin dose.  Info to office for provider review/callback.   May reach patient at [phone].

## 2012-01-04 ENCOUNTER — Ambulatory Visit (HOSPITAL_BASED_OUTPATIENT_CLINIC_OR_DEPARTMENT_OTHER): Payer: Medicare Other | Admitting: Oncology

## 2012-01-04 ENCOUNTER — Other Ambulatory Visit (HOSPITAL_BASED_OUTPATIENT_CLINIC_OR_DEPARTMENT_OTHER): Payer: Medicare Other

## 2012-01-04 VITALS — BP 118/78 | HR 86 | Temp 99.1°F | Resp 20 | Ht 71.0 in | Wt 207.8 lb

## 2012-01-04 DIAGNOSIS — R110 Nausea: Secondary | ICD-10-CM

## 2012-01-04 DIAGNOSIS — C9000 Multiple myeloma not having achieved remission: Secondary | ICD-10-CM

## 2012-01-04 DIAGNOSIS — Z7901 Long term (current) use of anticoagulants: Secondary | ICD-10-CM

## 2012-01-04 DIAGNOSIS — N289 Disorder of kidney and ureter, unspecified: Secondary | ICD-10-CM

## 2012-01-04 DIAGNOSIS — I2699 Other pulmonary embolism without acute cor pulmonale: Secondary | ICD-10-CM

## 2012-01-04 LAB — COMPREHENSIVE METABOLIC PANEL (CC13)
ALT: 22 U/L (ref 0–55)
BUN: 29 mg/dL — ABNORMAL HIGH (ref 7.0–26.0)
CO2: 19 mEq/L — ABNORMAL LOW (ref 22–29)
Creatinine: 1.8 mg/dL — ABNORMAL HIGH (ref 0.7–1.3)
Glucose: 114 mg/dl — ABNORMAL HIGH (ref 70–99)
Total Bilirubin: 0.23 mg/dL (ref 0.20–1.20)

## 2012-01-04 LAB — PROTIME-INR
INR: 2.9 (ref 2.00–3.50)
Protime: 34.8 Seconds — ABNORMAL HIGH (ref 10.6–13.4)

## 2012-01-04 LAB — CBC WITH DIFFERENTIAL/PLATELET
BASO%: 2.6 % — ABNORMAL HIGH (ref 0.0–2.0)
Basophils Absolute: 0.3 10*3/uL — ABNORMAL HIGH (ref 0.0–0.1)
HCT: 34 % — ABNORMAL LOW (ref 38.4–49.9)
LYMPH%: 13.4 % — ABNORMAL LOW (ref 14.0–49.0)
MCHC: 34.8 g/dL (ref 32.0–36.0)
MONO#: 1.4 10*3/uL — ABNORMAL HIGH (ref 0.1–0.9)
NEUT%: 66.8 % (ref 39.0–75.0)
Platelets: 230 10*3/uL (ref 140–400)
WBC: 10.6 10*3/uL — ABNORMAL HIGH (ref 4.0–10.3)

## 2012-01-04 MED ORDER — METOCLOPRAMIDE HCL 10 MG PO TABS: 10.0000 mg | ORAL_TABLET | Freq: Four times a day (QID) | ORAL | Status: DC

## 2012-01-04 NOTE — Progress Notes (Signed)
Hematology and Oncology Follow Up Visit  AEDIN JEANSONNE [id number] 1940-12-17 71 y.o. 01/04/2012    HPI: Mr. Bard is a 71 year old [place] and gentleman with stage I free lambda light chain multiple myeloma diagnosed 01/05/2011 after long-standing history of plasmacytoma. .  Interim History:   Mr. Jeffus is seen today with his wife in accompaniment for followup pertaining to stage I free lambda light chain multiple myeloma. He underwent stem cell transplantation about 39 days ago. His course has been relatively uncomplicated though did develop, embolism in house and is now on Coumadin. He did develop some of her screening heart block and had bradycardia for which he is being followed by cardiology. In addition he developed some renal insufficiency which has been fairly stable. This is felt to be related to dehydration. He had no infectious complications but is remains on Bactrim and acyclovir. He is also on a stable dose of Coumadin. His only complaint currently is intermittent nausea which decreases his oral intake. he would like he has lost about 8-9 pounds overall from his baseline.  Medications:   I have reviewed the patient's current medications.  Current Outpatient Prescriptions  Medication Sig Dispense Refill  . acetaminophen (TYLENOL) 500 MG tablet Take 1-2 tablets (500-1,000 mg total) by mouth every 6 (six) hours as needed for pain. For pain  30 tablet  1  . calcium carbonate (TUMS - DOSED IN MG ELEMENTAL CALCIUM) 500 MG chewable tablet Chew 1 tablet by mouth every 2 (two) hours as needed. HEART BURN        . Calcium-Vitamin D (CALTRATE 600 PLUS-VIT D PO) Take 1 tablet by mouth every other day.        . enoxaparin (LOVENOX) 40 MG/0.4ML SOLN Inject into the skin daily.        Marland Kitchen levothyroxine (SYNTHROID, LEVOTHROID) 100 MCG tablet Take 100 mcg by mouth daily.      . methylcellulose (ARTIFICIAL TEARS) 1 % ophthalmic solution Place 2 drops into both eyes 2  (two) times daily as needed. DRY EYES       . metoCLOPramide (REGLAN) 10 MG tablet Take 1 tablet (10 mg total) by mouth 4 (four) times daily.  60 tablet  3  . Multiple Vitamins-Minerals (MULTIVITAMINS THER. W/MINERALS) TABS Take 1 tablet by mouth every other day.        Marland Kitchen omeprazole (PRILOSEC) 20 MG capsule Take 20 mg by mouth 2 (two) times daily as needed. HEART BURN       . temazepam (RESTORIL) 30 MG capsule Take 1 capsule (30 mg total) by mouth at bedtime as needed for sleep.  30 capsule  0  . traMADol (ULTRAM) 50 MG tablet Take 1 tablet (50 mg total) by mouth every 6 (six) hours as needed for pain.  60 tablet  3  . warfarin (COUMADIN) 5 MG tablet TAKE ONE TABLET BY MOUTH DAILY.  90 tablet  0  . DISCONTD: enoxaparin (LOVENOX) 100 MG/ML injection Inject 1 mL (100 mg total) into the skin daily.  5 Syringe  0    Allergies:  No Known Allergies  Physical Exam: Filed Vitals:   01/04/12 1639  BP: 118/78  Pulse: 86  Temp: 99.1 F (37.3 C)  Resp: 20    Body mass index is 28.98 kg/(m^2). Weight: 220 lbs. HEENT:  Sclerae anicteric, conjunctivae pink.  Oropharynx clear.  No mucositis or candidiasis.   Nodes:  No cervical, supraclavicular, or axillary lymphadenopathy palpated.  Lungs:  Clear to auscultation bilaterally.  No crackles, rhonchi, or wheezes.  Region just medial to the left scapula is the area of sensitivity. He does feel like he has a bit of according, no evidence of skin rashes, no vertebral tenderness in this region. Heart:  Regular rate and rhythm.   Abdomen:  Soft, nontender.  Positive bowel sounds.  No organomegaly or masses palpated.   Musculoskeletal:  No focal spinal tenderness to gentle palpation.  Extremities:  Benign.  No peripheral edema or cyanosis.   Skin:  Benign.   Neuro:  Nonfocal, alert and oriented x 3.   Lab Results: Lab Results  Component Value Date   WBC 10.6* 01/04/2012   HGB 11.9* 01/04/2012   HCT 34.0* 01/04/2012   MCV 91.7 01/04/2012   PLT 230  01/04/2012   NEUTROABS 7.1* 01/04/2012     Chemistry      Component Value Date/Time   NA 137 01/04/2012 1626   NA 137 10/05/2011 1419   K 4.0 01/04/2012 1626   K 3.9 10/05/2011 1419   CL 111* 01/04/2012 1626   CL 104 10/05/2011 1419   CO2 19* 01/04/2012 1626   CO2 23 10/05/2011 1419   BUN 29.0* 01/04/2012 1626   BUN 13 10/05/2011 1419   CREATININE 1.8* 01/04/2012 1626   CREATININE 0.83 10/05/2011 1419   CREATININE 0.91 01/01/2011 1630      Component Value Date/Time   CALCIUM 8.6 01/04/2012 1626   CALCIUM 8.7 10/05/2011 1419   ALKPHOS 119 01/04/2012 1626   ALKPHOS 119* 12/24/2011 1643   AST 17 01/04/2012 1626   AST 21 12/24/2011 1643   ALT 22 01/04/2012 1626   ALT 39 12/24/2011 1643   BILITOT 0.23 01/04/2012 1626   BILITOT 0.3 12/24/2011 1643      Assessment:  Mr. Reamer is a 71 year old [place] and gentleman with stage I free lambda light chain multiple myeloma diagnosed 01/05/2011 after long-standing history of plasmacytoma. He is status post stem cell transplant day +39. He is engrafted easily. His only issue is what sounds like gastroparesis. In addition he has some mild renal insufficiency which on today's labs show somewhat of a deterioration compared to a week ago. His Coumadin dose is stable his INR is stable as well. He has not had any fevers.  Plan:  Patient is doing well as noted. My current recommendations are as follows #1 push IV fluids.   #2 by mouth Reglan for what sounds like gastroparesis  #3 we will recheck at that next week. He knows to call should he have any other concerns of nausea vomiting or potential dehydration. I plan to do restaging labs for myeloma at the end of December pleased to be seen at [facility] in 3 weeks and I will do that at that time.Pierce Crane,  01/04/2012

## 2012-01-07 ENCOUNTER — Other Ambulatory Visit: Payer: Medicare Other | Admitting: Lab

## 2012-01-07 ENCOUNTER — Telehealth: Payer: Self-pay | Admitting: *Deleted

## 2012-01-07 NOTE — Telephone Encounter (Signed)
Made patient appointment for 02-28-2012 starting at 1:00pm with labs and md

## 2012-01-08 LAB — PROTEIN ELECTROPHORESIS, SERUM
Albumin ELP: 56.7 % (ref 55.8–66.1)
Total Protein, Serum Electrophoresis: 5.9 g/dL — ABNORMAL LOW (ref 6.0–8.3)

## 2012-01-08 LAB — KAPPA/LAMBDA LIGHT CHAINS
Kappa free light chain: 1.85 mg/dL (ref 0.33–1.94)
Lambda Free Lght Chn: 2.88 mg/dL — ABNORMAL HIGH (ref 0.57–2.63)

## 2012-01-11 ENCOUNTER — Other Ambulatory Visit (HOSPITAL_BASED_OUTPATIENT_CLINIC_OR_DEPARTMENT_OTHER): Payer: Medicare Other | Admitting: Lab

## 2012-01-11 DIAGNOSIS — C9000 Multiple myeloma not having achieved remission: Secondary | ICD-10-CM

## 2012-01-11 DIAGNOSIS — Z5181 Encounter for therapeutic drug level monitoring: Secondary | ICD-10-CM

## 2012-01-11 DIAGNOSIS — C801 Malignant (primary) neoplasm, unspecified: Secondary | ICD-10-CM

## 2012-01-11 LAB — CBC WITH DIFFERENTIAL/PLATELET
Basophils Absolute: 0.1 10*3/uL (ref 0.0–0.1)
EOS%: 3.6 % (ref 0.0–7.0)
HGB: 12.1 g/dL — ABNORMAL LOW (ref 13.0–17.1)
MCH: 32.1 pg (ref 27.2–33.4)
MCHC: 35.1 g/dL (ref 32.0–36.0)
MCV: 91.4 fL (ref 79.3–98.0)
MONO%: 13.7 % (ref 0.0–14.0)
RDW: 16.6 % — ABNORMAL HIGH (ref 11.0–14.6)

## 2012-01-11 LAB — COMPREHENSIVE METABOLIC PANEL (CC13)
AST: 15 U/L (ref 5–34)
BUN: 23 mg/dL (ref 7.0–26.0)
CO2: 21 mEq/L — ABNORMAL LOW (ref 22–29)
Calcium: 8.9 mg/dL (ref 8.4–10.4)
Chloride: 109 mEq/L — ABNORMAL HIGH (ref 98–107)
Creatinine: 1.5 mg/dL — ABNORMAL HIGH (ref 0.7–1.3)

## 2012-01-11 LAB — PROTIME-INR: INR: 3.5 (ref 2.00–3.50)

## 2012-01-14 ENCOUNTER — Ambulatory Visit: Payer: Medicare Other | Admitting: Oncology

## 2012-01-14 ENCOUNTER — Other Ambulatory Visit: Payer: Medicare Other | Admitting: Lab

## 2012-01-18 ENCOUNTER — Other Ambulatory Visit (HOSPITAL_BASED_OUTPATIENT_CLINIC_OR_DEPARTMENT_OTHER): Payer: Medicare Other | Admitting: Lab

## 2012-01-18 ENCOUNTER — Telehealth: Payer: Self-pay | Admitting: *Deleted

## 2012-01-18 ENCOUNTER — Other Ambulatory Visit: Payer: Self-pay | Admitting: *Deleted

## 2012-01-18 DIAGNOSIS — C9000 Multiple myeloma not having achieved remission: Secondary | ICD-10-CM

## 2012-01-18 DIAGNOSIS — C801 Malignant (primary) neoplasm, unspecified: Secondary | ICD-10-CM

## 2012-01-18 DIAGNOSIS — Z5181 Encounter for therapeutic drug level monitoring: Secondary | ICD-10-CM

## 2012-01-18 DIAGNOSIS — Z86718 Personal history of other venous thrombosis and embolism: Secondary | ICD-10-CM

## 2012-01-18 DIAGNOSIS — Z7901 Long term (current) use of anticoagulants: Secondary | ICD-10-CM

## 2012-01-18 LAB — CBC WITH DIFFERENTIAL/PLATELET
Basophils Absolute: 0.1 10*3/uL (ref 0.0–0.1)
HCT: 34.4 % — ABNORMAL LOW (ref 38.4–49.9)
HGB: 12.1 g/dL — ABNORMAL LOW (ref 13.0–17.1)
MONO#: 1.1 10*3/uL — ABNORMAL HIGH (ref 0.1–0.9)
NEUT%: 67.2 % (ref 39.0–75.0)
WBC: 8.6 10*3/uL (ref 4.0–10.3)
lymph#: 1.4 10*3/uL (ref 0.9–3.3)

## 2012-01-18 LAB — COMPREHENSIVE METABOLIC PANEL (CC13)
ALT: 17 U/L (ref 0–55)
AST: 16 U/L (ref 5–34)
Albumin: 3.6 g/dL (ref 3.5–5.0)
Alkaline Phosphatase: 100 U/L (ref 40–150)
BUN: 21 mg/dL (ref 7.0–26.0)
Potassium: 3.7 mEq/L (ref 3.5–5.1)
Sodium: 140 mEq/L (ref 136–145)

## 2012-01-18 LAB — PROTIME-INR: INR: 3.8 — ABNORMAL HIGH (ref 2.00–3.50)

## 2012-01-18 NOTE — Telephone Encounter (Signed)
Per orders placed patient on 01-25-2012 at 2:00pm lab only

## 2012-01-18 NOTE — Progress Notes (Signed)
Called and spoke with patient. Per Dr. Donnie Coffin patient is to stay on current dose of Coumadin 5mg  daily.  We will recheck PT/INR 11/22.

## 2012-01-25 ENCOUNTER — Ambulatory Visit (INDEPENDENT_AMBULATORY_CARE_PROVIDER_SITE_OTHER): Payer: Medicare Other | Admitting: Family

## 2012-01-25 ENCOUNTER — Other Ambulatory Visit: Payer: Medicare Other

## 2012-01-25 DIAGNOSIS — Z86718 Personal history of other venous thrombosis and embolism: Secondary | ICD-10-CM

## 2012-01-25 NOTE — Patient Instructions (Addendum)
Resume coumadin same dose, 5 mg (1 pill). Everyday, Check in 1 weeks   Latest dosing instructions   Total Sun Mon Tue Wed Thu Fri Sat   35 5 mg 5 mg 5 mg 5 mg 5 mg 5 mg 5 mg    (5 mg1) (5 mg1) (5 mg1) (5 mg1) (5 mg1) (5 mg1) (5 mg1)

## 2012-02-04 ENCOUNTER — Encounter: Payer: Medicare Other | Admitting: Family

## 2012-02-04 DIAGNOSIS — Z0289 Encounter for other administrative examinations: Secondary | ICD-10-CM

## 2012-02-11 ENCOUNTER — Encounter: Payer: Self-pay | Admitting: Internal Medicine

## 2012-02-11 ENCOUNTER — Ambulatory Visit (INDEPENDENT_AMBULATORY_CARE_PROVIDER_SITE_OTHER): Payer: Medicare Other | Admitting: Family

## 2012-02-11 ENCOUNTER — Ambulatory Visit (INDEPENDENT_AMBULATORY_CARE_PROVIDER_SITE_OTHER): Payer: Medicare Other | Admitting: Internal Medicine

## 2012-02-11 VITALS — BP 130/80 | HR 72 | Temp 98.2°F | Resp 16 | Ht 71.0 in | Wt 222.0 lb

## 2012-02-11 DIAGNOSIS — R5383 Other fatigue: Secondary | ICD-10-CM

## 2012-02-11 DIAGNOSIS — C9000 Multiple myeloma not having achieved remission: Secondary | ICD-10-CM

## 2012-02-11 DIAGNOSIS — M545 Low back pain, unspecified: Secondary | ICD-10-CM

## 2012-02-11 DIAGNOSIS — C801 Malignant (primary) neoplasm, unspecified: Secondary | ICD-10-CM

## 2012-02-11 DIAGNOSIS — Z86718 Personal history of other venous thrombosis and embolism: Secondary | ICD-10-CM

## 2012-02-11 DIAGNOSIS — R5381 Other malaise: Secondary | ICD-10-CM

## 2012-02-11 DIAGNOSIS — M533 Sacrococcygeal disorders, not elsewhere classified: Secondary | ICD-10-CM

## 2012-02-11 DIAGNOSIS — Z7901 Long term (current) use of anticoagulants: Secondary | ICD-10-CM

## 2012-02-11 DIAGNOSIS — E039 Hypothyroidism, unspecified: Secondary | ICD-10-CM

## 2012-02-11 DIAGNOSIS — Z5181 Encounter for therapeutic drug level monitoring: Secondary | ICD-10-CM

## 2012-02-11 LAB — CBC WITH DIFFERENTIAL/PLATELET
Basophils Absolute: 0.1 10*3/uL (ref 0.0–0.1)
Eosinophils Absolute: 0.2 10*3/uL (ref 0.0–0.7)
HCT: 38.2 % — ABNORMAL LOW (ref 39.0–52.0)
Hemoglobin: 12.7 g/dL — ABNORMAL LOW (ref 13.0–17.0)
Lymphocytes Relative: 17.5 % (ref 12.0–46.0)
Lymphs Abs: 1.5 10*3/uL (ref 0.7–4.0)
MCHC: 33.2 g/dL (ref 30.0–36.0)
Neutro Abs: 5.7 10*3/uL (ref 1.4–7.7)
RDW: 16.1 % — ABNORMAL HIGH (ref 11.5–14.6)

## 2012-02-11 LAB — BASIC METABOLIC PANEL
BUN: 24 mg/dL — ABNORMAL HIGH (ref 6–23)
CO2: 23 mEq/L (ref 19–32)
Calcium: 9.2 mg/dL (ref 8.4–10.5)
Glucose, Bld: 80 mg/dL (ref 70–99)
Sodium: 138 mEq/L (ref 135–145)

## 2012-02-11 NOTE — Patient Instructions (Signed)
The patient is instructed to continue all medications as prescribed. Schedule followup with check out clerk upon leaving the clinic  

## 2012-02-11 NOTE — Patient Instructions (Addendum)
Resume coumadin same dose, 5 mg (1 pill). Everyday, Check in 3 weeks    Latest dosing instructions   Total Sun Mon Tue Wed Thu Fri Sat   35 5 mg 5 mg 5 mg 5 mg 5 mg 5 mg 5 mg    (5 mg1) (5 mg1) (5 mg1) (5 mg1) (5 mg1) (5 mg1) (5 mg1)

## 2012-02-11 NOTE — Progress Notes (Signed)
Subjective:    Patient ID: Rodney [name], male    DOB: 02/14/1941, 71 y.o.   MRN: [id number]  HPI Patient is a 71 year old male treated for metastatic multiple myeloma underwent bone marrow ablation and bone marrow transplant.  He is followed by [facility] oncology has an appointment in January we will monitor today a CBC differential for anemia as well as basic metabolic panel for renal function and a ProTime.  He is elected to have our office monitor his Coumadin therapy which we are more than happy to do.    Review of Systems  Constitutional: Positive for activity change, appetite change and fatigue. Negative for fever.  HENT: Negative for hearing loss, congestion, neck pain and postnasal drip.   Eyes: Negative for discharge, redness and visual disturbance.  Respiratory: Negative for cough, shortness of breath and wheezing.   Cardiovascular: Negative for leg swelling.  Gastrointestinal: Negative for abdominal pain, constipation and abdominal distention.  Genitourinary: Negative for urgency and frequency.  Musculoskeletal: Negative for joint swelling and arthralgias.  Skin: Negative for color change and rash.  Neurological: Negative for weakness and light-headedness.  Hematological: Negative for adenopathy.  Psychiatric/Behavioral: Negative for behavioral problems.   Past Medical History  Diagnosis Date  . Pulmonary embolism   . Pancreatitis   . Pulmonary embolism   . Complication of anesthesia 02-16-11    Pt. speaks of awakening during the surgery from back  surgery  . Shortness of breath 02-16-11    hx. Pulmonary emboli x2 (9 yrs/ 3'12 -last)  . Anemia 02-16-11    01-05-11- post surgery-Transfusions x 4 units  . Prostate cancer   . Multiple myeloma 02-16-11    suspected tumor  left sacral  . Degenerative disc disease 02-16-11    01-05-11 L3 fusion done  . Hernia 02-16-11    left inguinal hernia at present  . Multiple myeloma(203.0)     History   Social  History  . Marital Status: Married    Spouse Name: N/A    Number of Children: N/A  . Years of Education: N/A   Occupational History  . Not on file.   Social History Main Topics  . Smoking status: Former Smoker    Quit date: 02/15/1969  . Smokeless tobacco: Never Used     Comment: quit 50 yrs  . Alcohol Use: No  . Drug Use: No  . Sexually Active: Yes   Other Topics Concern  . Not on file   Social History Narrative   MarriedRegular exercise-yes    Past Surgical History  Procedure Date  . Back surgery 02-16-11     01-05-11 Lumbar surgery L3(complicated by loss of blood volume)/ 01-09-11 then Lumbar fusion done with retained  hardware   . Cholecystectomy 04/06/2010    laparoscopic-inflammation with stones  . Inguinal hernia repair 02/20/2011    Procedure: HERNIA REPAIR INGUINAL ADULT;  Surgeon: Velora Heckler, MD;  Location: [facility];  Service: General;  Laterality: Left;  Repair Left Inguinal Hernia with Mesh  . Hernia repair 02/20/11    Inguinal hernia repair w/mesh    Family History  Problem Relation Age of Onset  . Lymphoma Father 32  . Cancer Father   . Clotting disorder Mother     blood clots    Allergies  Allergen Reactions  . Blood-Group Specific Substance     Must be Leukocyte Reduced and Irradiated due to stem cell transplant.     Current Outpatient Prescriptions on File Prior to Visit  Medication Sig Dispense Refill  . acetaminophen (TYLENOL) 500 MG tablet Take 1-2 tablets (500-1,000 mg total) by mouth every 6 (six) hours as needed for pain. For pain  30 tablet  1  . calcium carbonate (TUMS - DOSED IN MG ELEMENTAL CALCIUM) 500 MG chewable tablet Chew 1 tablet by mouth every 2 (two) hours as needed. HEART BURN        . levothyroxine (SYNTHROID, LEVOTHROID) 100 MCG tablet Take 100 mcg by mouth daily.      . methylcellulose (ARTIFICIAL TEARS) 1 % ophthalmic solution Place 2 drops into both eyes 2 (two) times daily as needed. DRY EYES       . omeprazole  (PRILOSEC) 20 MG capsule Take 20 mg by mouth 2 (two) times daily as needed. HEART BURN       . warfarin (COUMADIN) 5 MG tablet TAKE ONE TABLET BY MOUTH DAILY.  90 tablet  0  . [DISCONTINUED] temazepam (RESTORIL) 30 MG capsule Take 1 capsule (30 mg total) by mouth at bedtime as needed for sleep.  30 capsule  0    BP 130/80  Pulse 72  Temp 98.2 F (36.8 C)  Resp 16  Ht 5\' 11"  (1.803 m)  Wt 222 lb (100.699 kg)  BMI 30.96 kg/m2       Objective:   Physical Exam  Nursing note and vitals reviewed. Constitutional: He is oriented to person, place, and time. He appears well-developed and well-nourished.  HENT:  Head: Normocephalic and atraumatic.  Eyes: Conjunctivae normal are normal. Pupils are equal, round, and reactive to light.  Neck: Normal range of motion. Neck supple.  Cardiovascular: Normal rate and regular rhythm.   Pulmonary/Chest: Effort normal and breath sounds normal.  Abdominal: Soft. Bowel sounds are normal.  Neurological: He is alert and oriented to person, place, and time.          Assessment & Plan:  Bone marrow transplant Monitor CBC Protime for coumadin management Current dose is 5 mg daily Dicussed diet and fatigue Discussion of paced recovery Continue prilosec

## 2012-02-26 ENCOUNTER — Other Ambulatory Visit: Payer: Self-pay | Admitting: *Deleted

## 2012-02-26 DIAGNOSIS — C9000 Multiple myeloma not having achieved remission: Secondary | ICD-10-CM

## 2012-02-28 ENCOUNTER — Telehealth: Payer: Self-pay | Admitting: Oncology

## 2012-02-28 ENCOUNTER — Ambulatory Visit (HOSPITAL_COMMUNITY)
Admission: RE | Admit: 2012-02-28 | Discharge: 2012-02-28 | Disposition: A | Discharge status: Home / Self Care | Payer: Medicare Other | Source: Ambulatory Visit | Attending: Oncology | Admitting: Oncology

## 2012-02-28 ENCOUNTER — Ambulatory Visit (HOSPITAL_BASED_OUTPATIENT_CLINIC_OR_DEPARTMENT_OTHER): Payer: Medicare Other | Admitting: Oncology

## 2012-02-28 ENCOUNTER — Telehealth: Payer: Self-pay | Admitting: *Deleted

## 2012-02-28 ENCOUNTER — Other Ambulatory Visit (HOSPITAL_BASED_OUTPATIENT_CLINIC_OR_DEPARTMENT_OTHER): Payer: Medicare Other

## 2012-02-28 VITALS — BP 121/75 | HR 83 | Temp 97.3°F | Resp 20 | Ht 71.0 in | Wt 227.8 lb

## 2012-02-28 DIAGNOSIS — C9000 Multiple myeloma not having achieved remission: Secondary | ICD-10-CM

## 2012-02-28 DIAGNOSIS — Z86711 Personal history of pulmonary embolism: Secondary | ICD-10-CM

## 2012-02-28 DIAGNOSIS — C801 Malignant (primary) neoplasm, unspecified: Secondary | ICD-10-CM | POA: Insufficient documentation

## 2012-02-28 DIAGNOSIS — M546 Pain in thoracic spine: Secondary | ICD-10-CM | POA: Insufficient documentation

## 2012-02-28 DIAGNOSIS — Z7901 Long term (current) use of anticoagulants: Secondary | ICD-10-CM

## 2012-02-28 DIAGNOSIS — N289 Disorder of kidney and ureter, unspecified: Secondary | ICD-10-CM

## 2012-02-28 DIAGNOSIS — M549 Dorsalgia, unspecified: Secondary | ICD-10-CM

## 2012-02-28 LAB — COMPREHENSIVE METABOLIC PANEL (CC13)
ALT: 28 U/L (ref 0–55)
Albumin: 3.8 g/dL (ref 3.5–5.0)
Alkaline Phosphatase: 113 U/L (ref 40–150)
CO2: 21 mEq/L — ABNORMAL LOW (ref 22–29)
Glucose: 85 mg/dl (ref 70–99)
Potassium: 3.9 mEq/L (ref 3.5–5.1)
Sodium: 142 mEq/L (ref 136–145)
Total Protein: 7 g/dL (ref 6.4–8.3)

## 2012-02-28 LAB — CBC WITH DIFFERENTIAL/PLATELET
Eosinophils Absolute: 0.2 10*3/uL (ref 0.0–0.5)
HCT: 36.8 % — ABNORMAL LOW (ref 38.4–49.9)
LYMPH%: 15 % (ref 14.0–49.0)
MONO#: 1 10*3/uL — ABNORMAL HIGH (ref 0.1–0.9)
NEUT#: 7.7 10*3/uL — ABNORMAL HIGH (ref 1.5–6.5)
NEUT%: 73.2 % (ref 39.0–75.0)
Platelets: 257 10*3/uL (ref 140–400)
WBC: 10.6 10*3/uL — ABNORMAL HIGH (ref 4.0–10.3)

## 2012-02-28 LAB — PROTIME-INR: Protime: 25.2 Seconds — ABNORMAL HIGH (ref 10.6–13.4)

## 2012-02-28 NOTE — Progress Notes (Signed)
Hematology and Oncology Follow Up Visit  Rodney [name] [id number] 05-Dec-1940 71 y.o. 02/28/2012    HPI: Rodney [name] is a 71 year old [place] and gentleman with stage I free lambda light chain multiple myeloma diagnosed 01/05/2011 after long-standing history of plasmacytoma. .  Interim History:   Rodney [name] is seen today with his wife in accompaniment for followup pertaining to stage I free lambda light chain multiple myeloma. He underwent stem cell transplantation about 98  days ago.DOT 12/18/11. Has a history of pulmonary embolism. He is also on a stable dose of Coumadin. He is c/o of increasing pain in back and legs.This is similar to pain he experienced when he was younger. He used to take glucosamine. He has not been on Zometa since his transplant or before. Overall he is doing quite well. Your recent visit to [facility] and apparently his blood work about the results were within normal limits.  Medications:   I have reviewed the patient's current medications.  Current Outpatient Prescriptions  Medication Sig Dispense Refill  . acetaminophen (TYLENOL) 500 MG tablet Take 1-2 tablets (500-1,000 mg total) by mouth every 6 (six) hours as needed for pain. For pain  30 tablet  1  . calcium carbonate (TUMS - DOSED IN MG ELEMENTAL CALCIUM) 500 MG chewable tablet Chew 1 tablet by mouth every 2 (two) hours as needed. HEART BURN        . levothyroxine (SYNTHROID, LEVOTHROID) 100 MCG tablet Take 100 mcg by mouth daily.      . methylcellulose (ARTIFICIAL TEARS) 1 % ophthalmic solution Place 2 drops into both eyes 2 (two) times daily as needed. DRY EYES       . omeprazole (PRILOSEC) 20 MG capsule Take 20 mg by mouth 2 (two) times daily as needed. HEART BURN       . temazepam (RESTORIL) 30 MG capsule Take 30 mg by mouth at bedtime as needed.      . valACYclovir (VALTREX) 500 MG tablet Take 500 mg by mouth daily.      Marland Kitchen warfarin (COUMADIN) 5 MG tablet TAKE ONE TABLET BY  MOUTH DAILY.  90 tablet  0    Allergies:  Allergies  Allergen Reactions  . Blood-Group Specific Substance     Must be Leukocyte Reduced and Irradiated due to stem cell transplant.     Physical Exam: Filed Vitals:   02/28/12 1404  BP: 121/75  Pulse: 83  Temp: 97.3 F (36.3 C)  Resp: 20    Body mass index is 31.77 kg/(m^2). Weight: 220 lbs. HEENT:  Sclerae anicteric, conjunctivae pink.  Oropharynx clear.  No mucositis or candidiasis.   Nodes:  No cervical, supraclavicular, or axillary lymphadenopathy palpated.  Lungs:  Clear to auscultation bilaterally.  No crackles, rhonchi, or wheezes.  Region just medial to the left scapula is the area of sensitivity. He does feel like he has a bit of according, no evidence of skin rashes, no vertebral tenderness in this region. Heart:  Regular rate and rhythm.   Abdomen:  Soft, nontender.  Positive bowel sounds.  No organomegaly or masses palpated.   Musculoskeletal: He has some tenderness in the mid thoracic lower thoracic spine.  Extremities:  Benign.  No peripheral edema or cyanosis.   Skin:  Benign.   Neuro:  Nonfocal, alert and oriented x 3.   Lab Results: Lab Results  Component Value Date   WBC 10.6* 02/28/2012   HGB 12.8* 02/28/2012   HCT 36.8* 02/28/2012   MCV 94.7  02/28/2012   PLT 257 02/28/2012   NEUTROABS 7.7* 02/28/2012     Chemistry      Component Value Date/Time   NA 142 02/28/2012 1305   NA 138 02/11/2012 1109   K 3.9 02/28/2012 1305   K 4.4 02/11/2012 1109   CL 111* 02/28/2012 1305   CL 105 02/11/2012 1109   CO2 21* 02/28/2012 1305   CO2 23 02/11/2012 1109   BUN 23.0 02/28/2012 1305   BUN 24* 02/11/2012 1109   CREATININE 1.4* 02/28/2012 1305   CREATININE 1.4 02/11/2012 1109   CREATININE 0.91 01/01/2011 1630      Component Value Date/Time   CALCIUM 8.9 02/28/2012 1305   CALCIUM 9.2 02/11/2012 1109   ALKPHOS 113 02/28/2012 1305   ALKPHOS 119* 12/24/2011 1643   AST 26 02/28/2012 1305   AST 21 12/24/2011 1643    ALT 28 02/28/2012 1305   ALT 39 12/24/2011 1643   BILITOT 0.31 02/28/2012 1305   BILITOT 0.3 12/24/2011 1643      Assessment:  Rodney [name] is a 71 year old [place] and gentleman with stage I free lambda light chain multiple myeloma diagnosed 01/05/2011 after long-standing history of plasmacytoma. He is status post stem cell transplant day +97. He  engrafted easily.he is eating better with no nausea. In addition he has some mild renal insufficiency which on today's labs is 1.4.  His Coumadin dose is stable his INR is stable as well. He has not had any fevers. He is now in the Coumadin clinic   Plan:  Patient is doing well as noted. My current recommendations are as follows #1 continue present medications. #2 he sees other physicians [facility] in January,  we'll discuss restarting low-dose Revlimid.  #3 mid to low thoracic back pain similar to what he experienced the past I recommended that he can probably start his glucosamine again. In addition we'll get some plain x-rays of his back and up in the interim. Followup will be in 3 months.    Bryen Hinderman,  02/28/2012

## 2012-02-28 NOTE — Telephone Encounter (Signed)
appts made  And printed for pt Rodney [name]

## 2012-02-28 NOTE — Telephone Encounter (Signed)
Per staff phone call and POF I have scheduled appts. JMW  

## 2012-03-03 ENCOUNTER — Ambulatory Visit (INDEPENDENT_AMBULATORY_CARE_PROVIDER_SITE_OTHER): Payer: Medicare Other | Admitting: Family

## 2012-03-03 DIAGNOSIS — Z86718 Personal history of other venous thrombosis and embolism: Secondary | ICD-10-CM

## 2012-03-03 LAB — SPEP & IFE WITH QIG
Albumin ELP: 61.1 % (ref 55.8–66.1)
Alpha-1-Globulin: 3.9 % (ref 2.9–4.9)
Alpha-2-Globulin: 10.4 % (ref 7.1–11.8)
Beta 2: 4.8 % (ref 3.2–6.5)
Beta Globulin: 6.7 % (ref 4.7–7.2)
IgA: 247 mg/dL (ref 68–379)

## 2012-03-03 LAB — POCT INR: INR: 2.1

## 2012-03-03 NOTE — Patient Instructions (Addendum)
Resume coumadin same dose, 5 mg (1 pill). Everyday, Check in 4 weeks    Latest dosing instructions   Total Sun Mon Tue Wed Thu Fri Sat   35 5 mg 5 mg 5 mg 5 mg 5 mg 5 mg 5 mg    (5 mg1) (5 mg1) (5 mg1) (5 mg1) (5 mg1) (5 mg1) (5 mg1)

## 2012-03-09 ENCOUNTER — Ambulatory Visit: Payer: Self-pay | Admitting: General Practice

## 2012-03-11 ENCOUNTER — Telehealth: Payer: Self-pay | Admitting: Internal Medicine

## 2012-03-11 NOTE — Telephone Encounter (Signed)
[facility] Triage Call Report Triage Record Num: [id number] Operator: Caswell Corwin Patient Name: Rodney [name] Call Date & Time: 03/08/2012 7:38:50PM Patient Phone: [phone] PCP: Darryll Capers Patient Gender: Male PCP Fax : [phone] Patient DOB: September 05, 1940 [facility] Name: [facility]  Reason for Call: Addendum to encounter for 03/08/12 @ 1902. Appt cancelled for 03/09/12 at [facility] office as they will not be opened. Pt recalled and instructed needs to be seen in 24 hrs at an urgent care or [name] ED. Voices understanding. Protocol(s) Used: PCP Calls, No Triage (Adult) Recommended Outcome per Protocol: Provide Information or Advice Only Reason for Outcome: [1] Other nonurgent information for PCP AND [2] does not require PCP response

## 2012-03-26 ENCOUNTER — Other Ambulatory Visit: Payer: Self-pay

## 2012-03-27 ENCOUNTER — Telehealth: Payer: Self-pay | Admitting: *Deleted

## 2012-03-27 ENCOUNTER — Ambulatory Visit: Payer: Medicare Other

## 2012-03-27 NOTE — Telephone Encounter (Signed)
Pt was scheduled for zometa today.  He has not received zometa since August and Dr Rubin's last progress note 12/26 was a little vague regarding when to r/s zometa.  Pharmacy was unclear regarding what to do as well.  Pt is also going to be having some dental work done soon.    Spoke with Baxter Hire about this in Dr Lodema Pilot absence.  We called pt and held the zometa for today.  Pt will call back  when he gets his dental work done.

## 2012-03-31 ENCOUNTER — Encounter: Payer: Medicare Other | Admitting: Family

## 2012-04-01 ENCOUNTER — Ambulatory Visit (INDEPENDENT_AMBULATORY_CARE_PROVIDER_SITE_OTHER): Payer: Medicare Other | Admitting: Family

## 2012-04-01 DIAGNOSIS — Z86718 Personal history of other venous thrombosis and embolism: Secondary | ICD-10-CM

## 2012-04-01 NOTE — Patient Instructions (Addendum)
Take and extra 1/2 tab today and tomorrow. Resume coumadin same dose, 5 mg (1 pill). Everyday, Check in 4 weeks    Latest dosing instructions   Total Sun Mon Tue Wed Thu Fri Sat   35 5 mg 5 mg 5 mg 5 mg 5 mg 5 mg 5 mg    (5 mg1) (5 mg1) (5 mg1) (5 mg1) (5 mg1) (5 mg1) (5 mg1)

## 2012-04-05 ENCOUNTER — Encounter: Payer: Self-pay | Admitting: Oncology

## 2012-04-05 ENCOUNTER — Telehealth: Payer: Self-pay | Admitting: Oncology

## 2012-04-05 NOTE — Telephone Encounter (Signed)
Former PR pt reassigned to [facility]. S/w pt re new provider/date/time. Pt was aware. Letter mailed.

## 2012-04-06 ENCOUNTER — Emergency Department (HOSPITAL_COMMUNITY): Payer: Medicare Other

## 2012-04-06 ENCOUNTER — Encounter (HOSPITAL_COMMUNITY): Payer: Self-pay | Admitting: Emergency Medicine

## 2012-04-06 ENCOUNTER — Emergency Department (HOSPITAL_COMMUNITY)
Admission: EM | Admit: 2012-04-06 | Discharge: 2012-04-06 | Disposition: A | Discharge status: Home / Self Care | Payer: Medicare Other | Attending: Emergency Medicine | Admitting: Emergency Medicine

## 2012-04-06 DIAGNOSIS — Z807 Family history of other malignant neoplasms of lymphoid, hematopoietic and related tissues: Secondary | ICD-10-CM | POA: Insufficient documentation

## 2012-04-06 DIAGNOSIS — Z8546 Personal history of malignant neoplasm of prostate: Secondary | ICD-10-CM | POA: Insufficient documentation

## 2012-04-06 DIAGNOSIS — Z86711 Personal history of pulmonary embolism: Secondary | ICD-10-CM | POA: Insufficient documentation

## 2012-04-06 DIAGNOSIS — Z8739 Personal history of other diseases of the musculoskeletal system and connective tissue: Secondary | ICD-10-CM | POA: Insufficient documentation

## 2012-04-06 DIAGNOSIS — Z8719 Personal history of other diseases of the digestive system: Secondary | ICD-10-CM | POA: Insufficient documentation

## 2012-04-06 DIAGNOSIS — Z7901 Long term (current) use of anticoagulants: Secondary | ICD-10-CM | POA: Insufficient documentation

## 2012-04-06 DIAGNOSIS — Z79899 Other long term (current) drug therapy: Secondary | ICD-10-CM | POA: Insufficient documentation

## 2012-04-06 DIAGNOSIS — J069 Acute upper respiratory infection, unspecified: Secondary | ICD-10-CM | POA: Insufficient documentation

## 2012-04-06 DIAGNOSIS — J029 Acute pharyngitis, unspecified: Secondary | ICD-10-CM | POA: Insufficient documentation

## 2012-04-06 DIAGNOSIS — Z87891 Personal history of nicotine dependence: Secondary | ICD-10-CM | POA: Insufficient documentation

## 2012-04-06 DIAGNOSIS — Z862 Personal history of diseases of the blood and blood-forming organs and certain disorders involving the immune mechanism: Secondary | ICD-10-CM | POA: Insufficient documentation

## 2012-04-06 LAB — BASIC METABOLIC PANEL
BUN: 19 mg/dL (ref 6–23)
Chloride: 103 mEq/L (ref 96–112)
GFR calc non Af Amer: 57 mL/min — ABNORMAL LOW (ref >90)
Glucose, Bld: 110 mg/dL — ABNORMAL HIGH (ref 70–99)
Potassium: 3.9 mEq/L (ref 3.5–5.1)

## 2012-04-06 LAB — CBC
HCT: 36.6 % — ABNORMAL LOW (ref 39.0–52.0)
Hemoglobin: 12.7 g/dL — ABNORMAL LOW (ref 13.0–17.0)
MCHC: 34.7 g/dL (ref 30.0–36.0)
MCV: 92.9 fL (ref 78.0–100.0)

## 2012-04-06 LAB — PROTIME-INR
INR: 1.73 — ABNORMAL HIGH (ref 0.00–1.49)
Prothrombin Time: 19.7 seconds — ABNORMAL HIGH (ref 11.6–15.2)

## 2012-04-06 MED ORDER — HYDROCOD POLST-CHLORPHEN POLST 10-8 MG/5ML PO LQCR: 5.0000 mL | Freq: Two times a day (BID) | ORAL | Status: DC | PRN

## 2012-04-06 MED ORDER — GUAIFENESIN-DM 100-10 MG/5ML PO SYRP: 5.0000 mL | ORAL_SOLUTION | Freq: Three times a day (TID) | ORAL | Status: DC | PRN

## 2012-04-06 NOTE — ED Notes (Signed)
Marked as complete in error. Patient is waiting for xray to be done.

## 2012-04-06 NOTE — ED Notes (Signed)
MD at bedside. 

## 2012-04-06 NOTE — ED Provider Notes (Deleted)
History     CSN: [id number]  Arrival date & time 04/06/12  1847   First MD Initiated Contact with Patient 04/06/12 1948      Chief Complaint  Patient presents with  . Cough  . Sore Throat    (Consider location/radiation/quality/duration/timing/severity/associated sxs/prior treatment) HPI  Past Medical History  Diagnosis Date  . Pulmonary embolism   . Pancreatitis   . Pulmonary embolism   . Complication of anesthesia 02-16-11    Pt. speaks of awakening during the surgery from back  surgery  . Shortness of breath 02-16-11    hx. Pulmonary emboli x2 (9 yrs/ 3'12 -last)  . Anemia 02-16-11    01-05-11- post surgery-Transfusions x 4 units  . Prostate cancer   . Multiple myeloma 02-16-11    suspected tumor  left sacral  . Degenerative disc disease 02-16-11    01-05-11 L3 fusion done  . Hernia 02-16-11    left inguinal hernia at present  . Multiple myeloma(203.0)     Past Surgical History  Procedure Date  . Back surgery 02-16-11     01-05-11 Lumbar surgery L3(complicated by loss of blood volume)/ 01-09-11 then Lumbar fusion done with retained  hardware   . Cholecystectomy 04/06/2010    laparoscopic-inflammation with stones  . Inguinal hernia repair 02/20/2011    Procedure: HERNIA REPAIR INGUINAL ADULT;  Surgeon: Velora Heckler, MD;  Location: [facility];  Service: General;  Laterality: Left;  Repair Left Inguinal Hernia with Mesh  . Hernia repair 02/20/11    Inguinal hernia repair w/mesh    Family History  Problem Relation Age of Onset  . Lymphoma Father 101  . Cancer Father   . Clotting disorder Mother     blood clots    History  Substance Use Topics  . Smoking status: Former Smoker    Quit date: 02/15/1969  . Smokeless tobacco: Never Used     Comment: quit 50 yrs  . Alcohol Use: No      Review of Systems  Allergies  Blood-group specific substance  Home Medications   Current Outpatient Rx  Name  Route  Sig  Dispense  Refill  . BENZONATATE 100 MG PO CAPS  Oral   Take 100 mg by mouth 3 (three) times daily as needed. Cough         . CALCIUM CARBONATE ANTACID 500 MG PO CHEW   Oral   Chew 1 tablet by mouth every 2 (two) hours as needed. HEART BURN           . LEVOTHYROXINE SODIUM 100 MCG PO TABS   Oral   Take 100 mcg by mouth daily.         . METHYLCELLULOSE 1 % OP SOLN   Both Eyes   Place 2 drops into both eyes 2 (two) times daily as needed. DRY EYES          . OMEPRAZOLE 20 MG PO CPDR   Oral   Take 20 mg by mouth 2 (two) times daily as needed. HEART BURN          . VALACYCLOVIR HCL 500 MG PO TABS   Oral   Take 500 mg by mouth daily.         . WARFARIN SODIUM 5 MG PO TABS   Oral   Take 5 mg by mouth daily.         Marland Kitchen HYDROCOD POLST-CPM POLST ER 10-8 MG/5ML PO LQCR   Oral   Take 5 mLs by  mouth every 12 (twelve) hours as needed.   140 mL   0   . GUAIFENESIN-DM 100-10 MG/5ML PO SYRP   Oral   Take 5 mLs by mouth 3 (three) times daily as needed for cough.   118 mL   0     BP 118/71  Pulse 72  Temp 98.7 F (37.1 C) (Oral)  Resp 13  SpO2 95%  Physical Exam  ED Course  Procedures (including critical care time)  Labs Reviewed  CBC - Abnormal; Notable for the following:    RBC 3.94 (*)     Hemoglobin 12.7 (*)     HCT 36.6 (*)     All other components within normal limits  BASIC METABOLIC PANEL - Abnormal; Notable for the following:    Glucose, Bld 110 (*)     GFR calc non Af Amer 57 (*)     GFR calc Af Amer 66 (*)     All other components within normal limits  PROTIME-INR - Abnormal; Notable for the following:    Prothrombin Time 19.7 (*)     INR 1.73 (*)     All other components within normal limits  POCT I-STAT TROPONIN I   Dg Chest 2 View  04/06/2012  *RADIOLOGY REPORT*  Clinical Data: Cough.  Chest pain.  CHEST - 2 VIEW  Comparison: 05/19/2011.  Findings: Low volume chest with basilar atelectasis.  No airspace disease.  No pleural effusion. Monitoring leads are projected over the chest.   Cardiopericardial silhouette appears within normal limits allowing for volumes of inspiration.  Right AC joint osteoarthritis.  Stable lucency in the distal left clavicle appears similar to 07/24/2010.  IMPRESSION: Low volume chest.   Original Report Authenticated By: Andreas Newport, M.D.      1. URI, acute       MDM  Patient with cough and chest congestion. 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 He is on Coumadin for previous   P., but states this does not feel like that. He is not hypoxic. He has mildly low INR and will take an extra dose tonight and followup. X-ray does not show pneumonia. At this point I doubt pulmonary embolism. He has had a stem cell transplant, however I doubt immune compromise at this time.        Juliet Rude. Rubin Payor, MD 04/07/12 0000

## 2012-04-06 NOTE — ED Notes (Signed)
Pt states that he has been coughing with chest congestion. Pt also states that he has chest pain when he coughs and some SOB. Denies fever, n/v/d. 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 Pt A&O x 4 and in NAD.

## 2012-04-07 ENCOUNTER — Telehealth: Payer: Self-pay | Admitting: Internal Medicine

## 2012-04-07 NOTE — Telephone Encounter (Signed)
Triage Call Report Triage Record Num: [id number] Operator: Patriciaann Clan Patient Name: Rodney [name] Call Date & Time: 04/06/2012 6:01:17PM Patient Phone: [phone] PCP: Darryll Capers Patient Gender: Male PCP Fax : [phone] Patient DOB: June 10, 1940 [facility] Name[facility] Reason for Call: Caller: Rodney [name]/Patient; PCP: Darryll Capers (Adults only); CB#: [phone]; Call regarding Cough/Congestion; Patient states he developed cough, congestion, sore throat. Onset 04/05/12. Afebrile. Patient is status post stem cell transplant 12/2011. Patient states he " burning in my bronchioles" with cough. States cough is non productive. Patient states coughand congestion have gradually increased throughout the day. States has intermittent frontal headache. Triage per Upper Respiratory Infection Protocol. No emergent sx identified. Dispsotion of " See Provider within 72 hours" obtained related to positive triage assessment for " Generalized mild frontal headache unresponsive to home care measures." Patient advised to be evaluated in ED 04/06/12 (nursing judgement) due to history of stem cell transplant. Patient verbalizes understanding and agreeable. Protocol(s) Used: Upper Respiratory Infection (URI) Recommended Outcome per Protocol: See Provider within 72 Hours Reason for Outcome: Generalized mild frontal headache unresponsive to home care measures Care Advice: ~ SYMPTOM / CONDITION MANAGEMENT 02/02/

## 2012-04-16 ENCOUNTER — Telehealth: Payer: Self-pay | Admitting: Internal Medicine

## 2012-04-16 NOTE — Telephone Encounter (Signed)
Patient Information:  Caller Name: Garold  Phone: [phone]  Patient: Rodney [name], Rodney [name]  Gender: Male  DOB: 1940/10/22  Age: 71 Years  PCP: Darryll Capers (Adults only)  Office Follow Up:  Does the office need to follow up with this patient?: Yes  Instructions For The Office: Patient needs appointment either 2/12 or 2/13; he requests to be seen in office due to ongoing upper respiratory symptoms for over 3 weeks.  He is high risk patient.  No appointments with PCP or Ms. Orvan Falconer on 2/13.  Please check for any available appointment and follow up with him.   Symptoms  Reason For Call & Symptoms: "Cold"; seen in ED and diagnosed with URI 04/06/12.    Symptoms have not resolved. He was seen for the previous 2 weekends in ED as well, took Z-pack.  He reports wheezing and right ear pain. Sore throat reported.  Temp 96.6 by temporal scan.  Reviewed Health History In EMR: Yes  Reviewed Medications In EMR: Yes  Reviewed Allergies In EMR: Yes  Reviewed Surgeries / Procedures: Yes  Date of Onset of Symptoms: 04/06/2012  Treatments Tried: Robitussin DM  Treatments Tried Worked: No  Guideline(s) Used:  Colds  Disposition Per Guideline:   See Today or Tomorrow in Office  Reason For Disposition Reached:   Sinus congestion (pressure, fullness) present > 10 days  Advice Given:  N/A

## 2012-04-16 NOTE — Telephone Encounter (Signed)
Appt sched for 2/14. Encounter closed.

## 2012-04-18 ENCOUNTER — Ambulatory Visit: Payer: Medicare Other | Admitting: Family

## 2012-04-18 NOTE — ED Provider Notes (Signed)
History     CSN: [id number]  Arrival date & time 04/06/12  1847   First MD Initiated Contact with Patient 04/06/12 1948      Chief Complaint  Patient presents with  . Cough  . Sore Throat    (Consider location/radiation/quality/duration/timing/severity/associated sxs/prior treatment) Patient is a 72 y.o. male presenting with cough and pharyngitis.  Cough Associated symptoms: sore throat   Associated symptoms: no chest pain, no headaches, no rash and no shortness of breath   Sore Throat Pertinent negatives include no chest pain, no abdominal pain, no headaches and no shortness of breath.  patient presents with cough and sore throat.has been going for a couple days. Nonproductive cough. He no chest pain. He does hurt all over. No fevers. The throat is worse with swallowing. He does not feel like his previous pulmonary embolisms. He is on Coumadin.  Past Medical History  Diagnosis Date  . Pulmonary embolism   . Pancreatitis   . Pulmonary embolism   . Complication of anesthesia 02-16-11    Pt. speaks of awakening during the surgery from back  surgery  . Shortness of breath 02-16-11    hx. Pulmonary emboli x2 (9 yrs/ 3'12 -last)  . Anemia 02-16-11    01-05-11- post surgery-Transfusions x 4 units  . Prostate cancer   . Multiple myeloma 02-16-11    suspected tumor  left sacral  . Degenerative disc disease 02-16-11    01-05-11 L3 fusion done  . Hernia 02-16-11    left inguinal hernia at present  . Multiple myeloma(203.0)     Past Surgical History  Procedure Laterality Date  . Back surgery  02-16-11     01-05-11 Lumbar surgery L3(complicated by loss of blood volume)/ 01-09-11 then Lumbar fusion done with retained  hardware   . Cholecystectomy  04/06/2010    laparoscopic-inflammation with stones  . Inguinal hernia repair  02/20/2011    Procedure: HERNIA REPAIR INGUINAL ADULT;  Surgeon: Velora Heckler, MD;  Location: [facility];  Service: General;  Laterality: Left;  Repair Left Inguinal  Hernia with Mesh  . Hernia repair  02/20/11    Inguinal hernia repair w/mesh    Family History  Problem Relation Age of Onset  . Lymphoma Father 22  . Cancer Father   . Clotting disorder Mother     blood clots    History  Substance Use Topics  . Smoking status: Former Smoker    Quit date: 02/15/1969  . Smokeless tobacco: Never Used     Comment: quit 50 yrs  . Alcohol Use: No      Review of Systems  Constitutional: Negative for activity change and appetite change.  HENT: Positive for sore throat. Negative for neck stiffness.   Eyes: Negative for pain.  Respiratory: Positive for cough. Negative for chest tightness and shortness of breath.   Cardiovascular: Negative for chest pain and leg swelling.  Gastrointestinal: Negative for nausea, vomiting, abdominal pain and diarrhea.  Genitourinary: Negative for flank pain.  Musculoskeletal: Negative for back pain.  Skin: Negative for rash.  Neurological: Negative for weakness, numbness and headaches.  Psychiatric/Behavioral: Negative for behavioral problems.    Allergies  Blood-group specific substance  Home Medications   Current Outpatient Rx  Name  Route  Sig  Dispense  Refill  . benzonatate (TESSALON) 100 MG capsule   Oral   Take 100 mg by mouth 3 (three) times daily as needed. Cough         . calcium carbonate (TUMS -  DOSED IN MG ELEMENTAL CALCIUM) 500 MG chewable tablet   Oral   Chew 1 tablet by mouth every 2 (two) hours as needed. HEART BURN           . levothyroxine (SYNTHROID, LEVOTHROID) 100 MCG tablet   Oral   Take 100 mcg by mouth daily.         . methylcellulose (ARTIFICIAL TEARS) 1 % ophthalmic solution   Both Eyes   Place 2 drops into both eyes 2 (two) times daily as needed. DRY EYES          . omeprazole (PRILOSEC) 20 MG capsule   Oral   Take 20 mg by mouth 2 (two) times daily as needed. HEART BURN          . valACYclovir (VALTREX) 500 MG tablet   Oral   Take 500 mg by mouth daily.          Marland Kitchen warfarin (COUMADIN) 5 MG tablet   Oral   Take 5 mg by mouth daily.         . chlorpheniramine-HYDROcodone (TUSSIONEX PENNKINETIC ER) 10-8 MG/5ML LQCR   Oral   Take 5 mLs by mouth every 12 (twelve) hours as needed.   140 mL   0   . guaiFENesin-dextromethorphan (ROBITUSSIN DM) 100-10 MG/5ML syrup   Oral   Take 5 mLs by mouth 3 (three) times daily as needed for cough.   118 mL   0     BP 118/71  Pulse 72  Temp(Src) 98.7 F (37.1 C) (Oral)  Resp 13  SpO2 95%  Physical Exam  Nursing note and vitals reviewed. Constitutional: He is oriented to person, place, and time. He appears well-developed and well-nourished.  HENT:  Head: Normocephalic and atraumatic.  Mouth/Throat: No oropharyngeal exudate.  Mild posterior pharyngeal erythema without exudate  Eyes: EOM are normal. Pupils are equal, round, and reactive to light.  Neck: Normal range of motion. Neck supple.  Cardiovascular: Normal rate, regular rhythm and normal heart sounds.   No murmur heard. Pulmonary/Chest: Effort normal and breath sounds normal.  Abdominal: Soft. Bowel sounds are normal. He exhibits no distension and no mass. There is no tenderness. There is no rebound and no guarding.  Musculoskeletal: Normal range of motion. He exhibits no edema.  Neurological: He is alert and oriented to person, place, and time. No cranial nerve deficit.  Skin: Skin is warm and dry.  Psychiatric: He has a normal mood and affect.    ED Course  Procedures (including critical care time)  Labs Reviewed  CBC - Abnormal; Notable for the following:    RBC 3.94 (*)    Hemoglobin 12.7 (*)    HCT 36.6 (*)    All other components within normal limits  BASIC METABOLIC PANEL - Abnormal; Notable for the following:    Glucose, Bld 110 (*)    GFR calc non Af Amer 57 (*)    GFR calc Af Amer 66 (*)    All other components within normal limits  PROTIME-INR - Abnormal; Notable for the following:    Prothrombin Time 19.7 (*)     INR 1.73 (*)    All other components within normal limits  POCT I-STAT TROPONIN I   No results found.   1. URI, acute      Date: 04/18/2012  Rate: 75  Rhythm: normal sinus rhythm  QRS Axis: normal  Intervals: normal  ST/T Wave abnormalities: normal  Conduction Disutrbances:right bundle branch block  Narrative Interpretation:  Old EKG Reviewed: unchanged   MDM  Patient with sore throat and cough. He does not feel like previous pulmonary embolisms. Doubt cardiac cause. Labs are reassuring except for a mildly subtherapeutic INR. Will take an extra dose. Patient has had a stem cell transplant however I do not think he is severely immunosuppressed this time. He will be discharged        Juliet Rude. Rubin Payor, MD 04/18/12 [phone]

## 2012-04-29 ENCOUNTER — Ambulatory Visit (INDEPENDENT_AMBULATORY_CARE_PROVIDER_SITE_OTHER): Payer: Medicare Other | Admitting: Family

## 2012-04-29 ENCOUNTER — Ambulatory Visit: Payer: Medicare Other | Admitting: Internal Medicine

## 2012-04-29 DIAGNOSIS — Z86718 Personal history of other venous thrombosis and embolism: Secondary | ICD-10-CM

## 2012-04-29 NOTE — Patient Instructions (Addendum)
continue same dose, 5 mg (1 pill). Everyday, Check in 4 weeks  Anticoagulation Dose Instructions as of 04/29/2012     Rodney [name] Tue Wed Thu Fri Sat   New Dose 5 mg 5 mg 5 mg 5 mg 5 mg 5 mg 5 mg    Description       continue same dose, 5 mg (1 pill). Everyday, Check in 4 weeks

## 2012-05-01 ENCOUNTER — Other Ambulatory Visit: Payer: Self-pay | Admitting: Oncology

## 2012-05-05 ENCOUNTER — Other Ambulatory Visit: Payer: Self-pay | Admitting: *Deleted

## 2012-05-06 ENCOUNTER — Other Ambulatory Visit: Payer: Self-pay | Admitting: *Deleted

## 2012-05-06 MED ORDER — METOCLOPRAMIDE HCL 10 MG PO TABS: 10.0000 mg | ORAL_TABLET | Freq: Two times a day (BID) | ORAL | Status: DC

## 2012-05-19 ENCOUNTER — Other Ambulatory Visit: Payer: Self-pay | Admitting: Oncology

## 2012-05-27 ENCOUNTER — Ambulatory Visit (INDEPENDENT_AMBULATORY_CARE_PROVIDER_SITE_OTHER): Payer: Medicare Other | Admitting: Family

## 2012-05-27 DIAGNOSIS — Z86718 Personal history of other venous thrombosis and embolism: Secondary | ICD-10-CM

## 2012-05-27 LAB — POCT INR: INR: 2.5

## 2012-05-27 NOTE — Patient Instructions (Addendum)
1.  History of myeloma 2.  Consider maintenance therapy with Revlimid to prolong duration of remission.

## 2012-05-27 NOTE — Patient Instructions (Addendum)
continue same dose, 5 mg (1 pill). Everyday, Check in 4 weeks  Anticoagulation Dose Instructions as of 05/27/2012     Rodney [name] Tue Wed Thu Fri Sat   New Dose 5 mg 5 mg 5 mg 5 mg 5 mg 5 mg 5 mg    Description       continue same dose, 5 mg (1 pill). Everyday, Check in 4 weeks

## 2012-05-28 ENCOUNTER — Ambulatory Visit (HOSPITAL_BASED_OUTPATIENT_CLINIC_OR_DEPARTMENT_OTHER): Payer: Medicare Other | Admitting: Lab

## 2012-05-28 ENCOUNTER — Encounter: Payer: Self-pay | Admitting: Oncology

## 2012-05-28 ENCOUNTER — Telehealth: Payer: Self-pay | Admitting: Oncology

## 2012-05-28 ENCOUNTER — Telehealth: Payer: Self-pay | Admitting: *Deleted

## 2012-05-28 ENCOUNTER — Ambulatory Visit (HOSPITAL_BASED_OUTPATIENT_CLINIC_OR_DEPARTMENT_OTHER): Payer: Medicare Other | Admitting: Oncology

## 2012-05-28 VITALS — BP 126/77 | HR 79 | Temp 96.8°F | Resp 20 | Ht 71.0 in | Wt 229.6 lb

## 2012-05-28 DIAGNOSIS — D6859 Other primary thrombophilia: Secondary | ICD-10-CM

## 2012-05-28 DIAGNOSIS — C9001 Multiple myeloma in remission: Secondary | ICD-10-CM

## 2012-05-28 DIAGNOSIS — Z86711 Personal history of pulmonary embolism: Secondary | ICD-10-CM

## 2012-05-28 DIAGNOSIS — C9000 Multiple myeloma not having achieved remission: Secondary | ICD-10-CM

## 2012-05-28 DIAGNOSIS — M899 Disorder of bone, unspecified: Secondary | ICD-10-CM

## 2012-05-28 LAB — BASIC METABOLIC PANEL (CC13)
Calcium: 8.5 mg/dL (ref 8.4–10.4)
Glucose: 107 mg/dl — ABNORMAL HIGH (ref 70–99)
Sodium: 140 mEq/L (ref 136–145)

## 2012-05-28 LAB — CBC WITH DIFFERENTIAL/PLATELET
BASO%: 1.1 % (ref 0.0–2.0)
MCHC: 34.1 g/dL (ref 32.0–36.0)
MONO#: 1.4 10*3/uL — ABNORMAL HIGH (ref 0.1–0.9)
RBC: 4.31 10*6/uL (ref 4.20–5.82)
WBC: 10.8 10*3/uL — ABNORMAL HIGH (ref 4.0–10.3)
lymph#: 1.3 10*3/uL (ref 0.9–3.3)

## 2012-05-28 NOTE — Telephone Encounter (Signed)
Per staff phone call and POF I have schedueld appts.  JMW  

## 2012-05-28 NOTE — Progress Notes (Signed)
[facility]  [phone] [phone]   OFFICE PROGRESS NOTE   Cc:  [name], MD  DIAGNOSIS: history of plasmacytoma and then myeloma.   PAST THERAPY:   Induction chemo, palliative radiation, autologous BMT in 12/2011.   CURRENT THERAPY:  Started on maintenance Revlimid 10mg  PO daily (no day off) per Dr. Marissa Calamity.      INTERVAL HISTORY: Rodney [name] 72 y.o. male returns for regular follow up with his wife.  He used to see Dr. Pierce Crane who had since left the practice.  I assumed his care of of today.  He presented with his wife.  He has had bilateral distal lower extremity cramp for the last few months.  It started even before resuming Revlimid; and has not worsened since then.    Patient denies fever, anorexia, weight loss, fatigue, headache, visual changes, confusion, drenching night sweats, palpable lymph node swelling, mucositis, odynophagia, dysphagia, nausea vomiting, jaundice, chest pain, palpitation, shortness of breath, dyspnea on exertion, productive cough, gum bleeding, epistaxis, hematemesis, hemoptysis, abdominal pain, abdominal swelling, early satiety, melena, hematochezia, hematuria, skin rash, spontaneous bleeding, joint swelling, joint pain, heat or cold intolerance, bowel bladder incontinence, back pain, focal motor weakness, depression.   Past Medical History  Diagnosis Date  . Pancreatitis   . Pulmonary embolism   . Complication of anesthesia 02-16-11    Pt. speaks of awakening during the surgery from back  surgery  . Shortness of breath 02-16-11    hx. Pulmonary emboli x2 (9 yrs/ 3'12 -last)  . Anemia 02-16-11    01-05-11- post surgery-Transfusions x 4 units  . Prostate cancer   . Multiple myeloma 02-16-11    suspected tumor  left sacral  . Degenerative disc disease 02-16-11    01-05-11 L3 fusion done  . Hernia 02-16-11    left inguinal hernia at present    Past Surgical History  Procedure Laterality  Date  . Back surgery  02-16-11     01-05-11 Lumbar surgery L3(complicated by loss of blood volume)/ 01-09-11 then Lumbar fusion done with retained  hardware   . Cholecystectomy  04/06/2010    laparoscopic-inflammation with stones  . Inguinal hernia repair  02/20/2011    Procedure: HERNIA REPAIR INGUINAL ADULT;  Surgeon: Velora Heckler, MD;  Location: [facility];  Service: General;  Laterality: Left;  Repair Left Inguinal Hernia with Mesh  . Hernia repair  02/20/11    Inguinal hernia repair w/mesh    Current Outpatient Prescriptions  Medication Sig Dispense Refill  . acetaminophen (TYLENOL) 325 MG tablet Take 650 mg by mouth every 6 (six) hours as needed for pain.      . calcium carbonate (TUMS - DOSED IN MG ELEMENTAL CALCIUM) 500 MG chewable tablet Chew 1 tablet by mouth every 2 (two) hours as needed. HEART BURN        . chlorpheniramine-HYDROcodone (TUSSIONEX PENNKINETIC ER) 10-8 MG/5ML LQCR Take 5 mLs by mouth every 12 (twelve) hours as needed.  140 mL  0  . levothyroxine (SYNTHROID, LEVOTHROID) 100 MCG tablet Take 100 mcg by mouth daily.      . methylcellulose (ARTIFICIAL TEARS) 1 % ophthalmic solution Place 2 drops into both eyes 2 (two) times daily as needed. DRY EYES       . metoCLOPramide (REGLAN) 10 MG tablet Take 1 tablet (10 mg total) by mouth 2 (two) times daily.  60 tablet  2  . omeprazole (PRILOSEC) 20 MG capsule Take 20 mg by mouth 2 (  two) times daily as needed. HEART BURN       . REVLIMID 10 MG capsule Take 10 mg by mouth daily.      . traMADol (ULTRAM) 50 MG tablet Take 50 mg by mouth every 6 (six) hours as needed for pain.      . valACYclovir (VALTREX) 500 MG tablet Take 500 mg by mouth daily.      Marland Kitchen warfarin (COUMADIN) 5 MG tablet Take 5 mg by mouth daily.       No current facility-administered medications for this visit.    ALLERGIES:  is allergic to blood-group specific substance.  REVIEW OF SYSTEMS:  The rest of the 14-point review of system was negative.   Filed Vitals:    05/28/12 1351  BP: 126/77  Pulse: 79  Temp: 96.8 F (36 C)  Resp: 20   Wt Readings from Last 3 Encounters:  05/28/12 229 lb 9.6 oz (104.146 kg)  02/28/12 227 lb 12.8 oz (103.329 kg)  02/11/12 222 lb (100.699 kg)   ECOG Performance status: 0  PHYSICAL EXAMINATION:   General:  well-nourished man, in no acute distress.  Eyes:  no scleral icterus.  ENT:  There were no oropharyngeal lesions.  Neck was without thyromegaly.  Lymphatics:  Negative cervical, supraclavicular or axillary adenopathy.  Respiratory: lungs were clear bilaterally without wheezing or crackles.  Cardiovascular:  Regular rate and rhythm, S1/S2, without murmur, rub or gallop.  There was no pedal edema.  GI:  abdomen was soft, flat, nontender, nondistended, without organomegaly.  Muscoloskeletal:  no spinal tenderness of palpation of vertebral spine.  Skin exam was without echymosis, petichae.  Neuro exam was nonfocal.  Patient was able to get on and off exam table without assistance.  Gait was normal.  Patient was alert and oriented.  Attention was good.   Language was appropriate.  Mood was normal without depression.  Speech was not pressured.  Thought content was not tangential.         LABORATORY/RADIOLOGY DATA:  Lab Results  Component Value Date   WBC 10.8* 05/28/2012   HGB 13.2 05/28/2012   HCT 38.8 05/28/2012   PLT 205 05/28/2012   GLUCOSE 107* 05/28/2012   CHOL 147 07/25/2010   TRIG 76 07/25/2010   HDL 43 07/25/2010   LDLDIRECT 98.3 05/31/2008   LDLCALC  Value: 89        Total Cholesterol/HDL:CHD Risk Coronary Heart Disease Risk Table                     Men   Women  1/2 Average Risk   3.4   3.3  Average Risk       5.0   4.4  2 X Average Risk   9.6   7.1  3 X Average Risk  23.4   11.0        Use the calculated Patient Ratio above and the CHD Risk Table to determine the patient's CHD Risk.        ATP III CLASSIFICATION (LDL):  <100     mg/dL   Optimal  147-829  mg/dL   Near or Above                    Optimal   130-159  mg/dL   Borderline  562-130  mg/dL   High  >865     mg/dL   Very High 7/84/6962   ALKPHOS 113 02/28/2012   ALT 28 02/28/2012   AST 26  02/28/2012   NA 140 05/28/2012   K 4.1 05/28/2012   CL 108* 05/28/2012   CREATININE 1.4* 05/28/2012   BUN 15.7 05/28/2012   CO2 24 05/28/2012   PSA  Value: <0.01 (NOTE) Result repeated and verified. Test Methodology: [employer] PSA* 07/26/2010   INR 2.5 05/27/2012     ASSESSMENT AND PLAN:    1.  History of plasmacytoma and myeloma:  I agreed with Dr. Gerhard Perches decision for maintenance chemo Revlimid.  I explained the potential benefit of longer duration of remission being on maintenance therapy.  There may even be a slight survival benefit.  Duration if mantenance Revlimid is 2 years now.  I discussed with him potential side effects of Revlimid which include but not limited to cytopenia, bleeding, fatigue; muscle cramp.  He expressed informed understanding and wished to continue Revlimid.   2.  Lytic bone lesions:  I strongly recommended him to resume Zometa.  He has not seen a dentist for 1-2 years.  I advised him to do this soon before resuming Zometa in about months.   3.  History of PE and positive hereditary hypercoagulable state.  He is on on Coumadin per PCP.   4.  Follow up: in about 1 month.     The length of time of the face-to-face encounter was 15 minutes. More than 50% of time was spent counseling and coordination of care.

## 2012-05-29 LAB — KAPPA/LAMBDA LIGHT CHAINS
Kappa free light chain: 1.75 mg/dL (ref 0.33–1.94)
Kappa:Lambda Ratio: 0.67 (ref 0.26–1.65)
Lambda Free Lght Chn: 2.6 mg/dL (ref 0.57–2.63)

## 2012-06-23 NOTE — Patient Instructions (Addendum)
1. Diagnosis: History of multiple myeloma. 2. Current treatment: Maintenance therapy with Revlimid with goal therapy for 2 years total. 3. Followup: lab only appointment in about 1 months, return visit in about 2 months.

## 2012-06-24 ENCOUNTER — Ambulatory Visit (INDEPENDENT_AMBULATORY_CARE_PROVIDER_SITE_OTHER): Payer: Medicare Other | Admitting: Family

## 2012-06-24 DIAGNOSIS — Z86718 Personal history of other venous thrombosis and embolism: Secondary | ICD-10-CM

## 2012-06-24 LAB — POCT INR: INR: 1.9

## 2012-06-24 NOTE — Patient Instructions (Addendum)
Today only, take an extra 1/2 tab. Then continue same dose, 5 mg (1 pill). Everyday, Check in 4 weeks   Anticoagulation Dose Instructions as of 06/24/2012     Glynis Smiles Tue Wed Thu Fri Sat   New Dose 5 mg 5 mg 5 mg 5 mg 5 mg 5 mg 5 mg    Description       Today only, take an extra 1/2 tab. Then continue same dose, 5 mg (1 pill). Everyday, Check in 4 weeks

## 2012-06-27 ENCOUNTER — Other Ambulatory Visit (HOSPITAL_BASED_OUTPATIENT_CLINIC_OR_DEPARTMENT_OTHER): Payer: Medicare Other | Admitting: Lab

## 2012-06-27 ENCOUNTER — Ambulatory Visit (HOSPITAL_BASED_OUTPATIENT_CLINIC_OR_DEPARTMENT_OTHER): Payer: Medicare Other

## 2012-06-27 ENCOUNTER — Telehealth: Payer: Self-pay | Admitting: Oncology

## 2012-06-27 ENCOUNTER — Ambulatory Visit (HOSPITAL_BASED_OUTPATIENT_CLINIC_OR_DEPARTMENT_OTHER): Payer: Medicare Other | Admitting: Oncology

## 2012-06-27 VITALS — BP 119/79 | HR 60 | Temp 96.7°F | Resp 18 | Ht 71.0 in | Wt 224.0 lb

## 2012-06-27 DIAGNOSIS — M949 Disorder of cartilage, unspecified: Secondary | ICD-10-CM

## 2012-06-27 DIAGNOSIS — Z86718 Personal history of other venous thrombosis and embolism: Secondary | ICD-10-CM

## 2012-06-27 DIAGNOSIS — M899 Disorder of bone, unspecified: Secondary | ICD-10-CM

## 2012-06-27 DIAGNOSIS — C9000 Multiple myeloma not having achieved remission: Secondary | ICD-10-CM

## 2012-06-27 DIAGNOSIS — Z7901 Long term (current) use of anticoagulants: Secondary | ICD-10-CM

## 2012-06-27 DIAGNOSIS — C9001 Multiple myeloma in remission: Secondary | ICD-10-CM

## 2012-06-27 LAB — CBC WITH DIFFERENTIAL/PLATELET
Basophils Absolute: 0.3 10*3/uL — ABNORMAL HIGH (ref 0.0–0.1)
EOS%: 7.1 % — ABNORMAL HIGH (ref 0.0–7.0)
HGB: 13.6 g/dL (ref 13.0–17.1)
MCH: 30.6 pg (ref 27.2–33.4)
NEUT#: 3.4 10*3/uL (ref 1.5–6.5)
RDW: 15.1 % — ABNORMAL HIGH (ref 11.0–14.6)
lymph#: 1.3 10*3/uL (ref 0.9–3.3)

## 2012-06-27 LAB — COMPREHENSIVE METABOLIC PANEL (CC13)
ALT: 43 U/L (ref 0–55)
AST: 28 U/L (ref 5–34)
Albumin: 3.6 g/dL (ref 3.5–5.0)
Alkaline Phosphatase: 115 U/L (ref 40–150)
Glucose: 90 mg/dl (ref 70–99)
Potassium: 3.7 mEq/L (ref 3.5–5.1)
Sodium: 140 mEq/L (ref 136–145)
Total Protein: 7.1 g/dL (ref 6.4–8.3)

## 2012-06-27 MED ORDER — ZOLEDRONIC ACID 4 MG/100ML IV SOLN
4.0000 mg | Freq: Once | INTRAVENOUS | Status: AC
  Administered 2012-06-27: 4 mg via INTRAVENOUS
  Filled 2012-06-27: qty 100

## 2012-06-27 MED ORDER — SODIUM CHLORIDE 0.9 % IV SOLN
Freq: Once | INTRAVENOUS | Status: AC
  Administered 2012-06-27: 10:00:00 via INTRAVENOUS

## 2012-06-27 NOTE — Patient Instructions (Addendum)
Zoledronic Acid injection (Hypercalcemia, Oncology) What is this medicine? ZOLEDRONIC ACID (ZOE le dron ik AS id) lowers the amount of calcium loss from bone. It is used to treat too much calcium in your blood from cancer. It is also used to prevent complications of cancer that has spread to the bone. This medicine may be used for other purposes; ask your health care provider or pharmacist if you have questions. What should I tell my health care provider before I take this medicine? They need to know if you have any of these conditions: -aspirin-sensitive asthma -dental disease -kidney disease -an unusual or allergic reaction to zoledronic acid, other medicines, foods, dyes, or preservatives -pregnant or trying to get pregnant -breast-feeding How should I use this medicine? This medicine is for infusion into a vein. It is given by a health care professional in a hospital or clinic setting. Talk to your pediatrician regarding the use of this medicine in children. Special care may be needed. Overdosage: If you think you have taken too much of this medicine contact a poison control center or emergency room at once. NOTE: This medicine is only for you. Do not share this medicine with others. What if I miss a dose? It is important not to miss your dose. Call your doctor or health care professional if you are unable to keep an appointment. What may interact with this medicine? -certain antibiotics given by injection -NSAIDs, medicines for pain and inflammation, like ibuprofen or naproxen -some diuretics like bumetanide, furosemide -teriparatide -thalidomide This list may not describe all possible interactions. Give your health care provider a list of all the medicines, herbs, non-prescription drugs, or dietary supplements you use. Also tell them if you smoke, drink alcohol, or use illegal drugs. Some items may interact with your medicine. What should I watch for while using this medicine? Visit  your doctor or health care professional for regular checkups. It may be some time before you see the benefit from this medicine. Do not stop taking your medicine unless your doctor tells you to. Your doctor may order blood tests or other tests to see how you are doing. Women should inform their doctor if they wish to become pregnant or think they might be pregnant. There is a potential for serious side effects to an unborn child. Talk to your health care professional or pharmacist for more information. You should make sure that you get enough calcium and vitamin D while you are taking this medicine. Discuss the foods you eat and the vitamins you take with your health care professional. Some people who take this medicine have severe bone, joint, and/or muscle pain. This medicine may also increase your risk for a broken thigh bone. Tell your doctor right away if you have pain in your upper leg or groin. Tell your doctor if you have any pain that does not go away or that gets worse. What side effects may I notice from receiving this medicine? Side effects that you should report to your doctor or health care professional as soon as possible: -allergic reactions like skin rash, itching or hives, swelling of the face, lips, or tongue -anxiety, confusion, or depression -breathing problems -changes in vision -feeling faint or lightheaded, falls -jaw burning, cramping, pain -muscle cramps, stiffness, or weakness -trouble passing urine or change in the amount of urine Side effects that usually do not require medical attention (report to your doctor or health care professional if they continue or are bothersome): -bone, joint, or muscle pain -  fever -hair loss -irritation at site where injected -loss of appetite -nausea, vomiting -stomach upset -tired This list may not describe all possible side effects. Call your doctor for medical advice about side effects. You may report side effects to FDA at  1-800-FDA-1088. Where should I keep my medicine? This drug is given in a hospital or clinic and will not be stored at home. NOTE: This sheet is a summary. It may not cover all possible information. If you have questions about this medicine, talk to your doctor, pharmacist, or health care provider.  2013, [employer]. (08/18/2010 9:06:58 AM)  

## 2012-06-28 ENCOUNTER — Telehealth: Payer: Self-pay | Admitting: Internal Medicine

## 2012-06-28 NOTE — Telephone Encounter (Signed)
Pt. Call stating that he is having having low grade fever of 99 with myalgias and some.  He is currently taking Revlemid and received injection of Zometa 04/25.  He reports similar Sxs int he past with Zometa and has used Percocet for myalgias in the past and asks if this can be celled in to the pharmacy. He denies cough, dysuria, diarrhea, or shortness of breath.   Assessment/Plan: 72 yo with MM, S/P t'plant on maintenance Revlemid, calling with complaints of low grade temp, myalgias, ans.  Suspect secondary to Zometa as pt. Reports similar Sxs with Zometa in the past. CBC from 04/25 without neutropenia    -Refill of Percocet 5/325mg  called into pharmacy. -Instructed to present to ED if develops cough, shortness of breath, dysuria, or diarrhea

## 2012-07-01 NOTE — Progress Notes (Signed)
[facility]  Telephone:([phone] Fax:([phone]   OFFICE PROGRESS NOTE   Cc:  [name], MD  DIAGNOSIS: history of plasmacytoma and then myeloma.   PAST THERAPY:   Induction chemo, palliative radiation, autologous BMT in 12/2011.   CURRENT THERAPY:  Started on maintenance Revlimid 10mg  PO daily (no day off) in 06/2012.    INTERVAL HISTORY: Rodney [name] 72 y.o. male returns for regular follow up with his wife.  He used to see Dr. Pierce Crane who had since left the practice.  I assumed his care of of today.  He presented with his wife.  He started maintenance Revlimid about 1 month ago and returns to clinic today for regular follow up.  He reports feeling well.  He denies fever, mucositis, nausea/vomiting, SOB, chest pain, abd pain, bleeding symptoms, cough, diarrhea/constipation, neuropathy, calf pain or swelling.  The rest of the 14-point review of system was negative.   Past Medical History  Diagnosis Date  . Pancreatitis   . Pulmonary embolism   . Complication of anesthesia 02-16-11    Pt. speaks of awakening during the surgery from back  surgery  . Shortness of breath 02-16-11    hx. Pulmonary emboli x2 (9 yrs/ 3'12 -last)  . Anemia 02-16-11    01-05-11- post surgery-Transfusions x 4 units  . Prostate cancer   . Multiple myeloma 02-16-11    suspected tumor  left sacral  . Degenerative disc disease 02-16-11    01-05-11 L3 fusion done  . Hernia 02-16-11    left inguinal hernia at present    Past Surgical History  Procedure Laterality Date  . Back surgery  02-16-11     01-05-11 Lumbar surgery L3(complicated by loss of blood volume)/ 01-09-11 then Lumbar fusion done with retained  hardware   . Cholecystectomy  04/06/2010    laparoscopic-inflammation with stones  . Inguinal hernia repair  02/20/2011    Procedure: HERNIA REPAIR INGUINAL ADULT;  Surgeon: Velora Heckler, MD;  Location: [facility];  Service: General;  Laterality: Left;  Repair Left  Inguinal Hernia with Mesh  . Hernia repair  02/20/11    Inguinal hernia repair w/mesh    Current Outpatient Prescriptions  Medication Sig Dispense Refill  . acetaminophen (TYLENOL) 325 MG tablet Take 650 mg by mouth every 6 (six) hours as needed for pain.      . calcium carbonate (TUMS - DOSED IN MG ELEMENTAL CALCIUM) 500 MG chewable tablet Chew 1 tablet by mouth every 2 (two) hours as needed. HEART BURN        . docusate sodium (COLACE) 100 MG capsule Take 100 mg by mouth 2 (two) times daily.      Marland Kitchen levothyroxine (SYNTHROID, LEVOTHROID) 100 MCG tablet Take 100 mcg by mouth daily.      . Loratadine (CLARITIN) 10 MG CAPS Take 10 mg by mouth daily.      . methylcellulose (ARTIFICIAL TEARS) 1 % ophthalmic solution Place 2 drops into both eyes 2 (two) times daily as needed. DRY EYES       . omeprazole (PRILOSEC) 20 MG capsule Take 20 mg by mouth 2 (two) times daily as needed. HEART BURN       . REVLIMID 10 MG capsule Take 10 mg by mouth daily.      . traMADol (ULTRAM) 50 MG tablet Take 50 mg by mouth every 6 (six) hours as needed for pain.      . valACYclovir (VALTREX) 500 MG tablet Take  500 mg by mouth daily.      Marland Kitchen warfarin (COUMADIN) 5 MG tablet Take 5 mg by mouth daily.       No current facility-administered medications for this visit.    ALLERGIES:  is allergic to blood-group specific substance.  REVIEW OF SYSTEMS:  The rest of the 14-point review of system was negative.   Filed Vitals:   06/27/12 0856  BP: 119/79  Pulse: 60  Temp: 96.7 F (35.9 C)  Resp: 18   Wt Readings from Last 3 Encounters:  06/27/12 224 lb (101.606 kg)  05/28/12 229 lb 9.6 oz (104.146 kg)  02/28/12 227 lb 12.8 oz (103.329 kg)   ECOG Performance status: 0  PHYSICAL EXAMINATION:   General:  well-nourished man, in no acute distress.  Eyes:  no scleral icterus.  ENT:  There were no oropharyngeal lesions.  Neck was without thyromegaly.  Lymphatics:  Negative cervical, supraclavicular or axillary  adenopathy.  Respiratory: lungs were clear bilaterally without wheezing or crackles.  Cardiovascular:  Regular rate and rhythm, S1/S2, without murmur, rub or gallop.  There was no pedal edema.  GI:  abdomen was soft, flat, nontender, nondistended, without organomegaly.  Muscoloskeletal:  no spinal tenderness of palpation of vertebral spine.  Skin exam was without echymosis, petichae.  Neuro exam was nonfocal.  Patient was able to get on and off exam table without assistance.  Gait was normal.  Patient was alert and oriented.  Attention was good.   Language was appropriate.  Mood was normal without depression.  Speech was not pressured.  Thought content was not tangential.         LABORATORY/RADIOLOGY DATA:  Lab Results  Component Value Date   WBC 6.4 06/27/2012   HGB 13.6 06/27/2012   HCT 39.6 06/27/2012   PLT 163 06/27/2012   GLUCOSE 90 06/27/2012   CHOL 147 07/25/2010   TRIG 76 07/25/2010   HDL 43 07/25/2010   LDLDIRECT 98.3 05/31/2008   LDLCALC  Value: 89        Total Cholesterol/HDL:CHD Risk Coronary Heart Disease Risk Table                     Men   Women  1/2 Average Risk   3.4   3.3  Average Risk       5.0   4.4  2 X Average Risk   9.6   7.1  3 X Average Risk  23.4   11.0        Use the calculated Patient Ratio above and the CHD Risk Table to determine the patient's CHD Risk.        ATP III CLASSIFICATION (LDL):  <100     mg/dL   Optimal  161-096  mg/dL   Near or Above                    Optimal  130-159  mg/dL   Borderline  045-409  mg/dL   High  >811     mg/dL   Very High 11/17/7827   ALKPHOS 115 06/27/2012   ALT 43 06/27/2012   AST 28 06/27/2012   NA 140 06/27/2012   K 3.7 06/27/2012   CL 110* 06/27/2012   CREATININE 1.2 06/27/2012   BUN 17.2 06/27/2012   CO2 22 06/27/2012   PSA  Value: <0.01 (NOTE) Result repeated and verified. Test Methodology: [employer] PSA* 07/26/2010   INR 1.9 06/24/2012     ASSESSMENT AND PLAN:  1.  History of plasmacytoma and myeloma:  Doing well on maintenance  Revlimid without side effects.  I recommend to   2.  Lytic bone lesions:  He was cleared by his dentist for Zometa. [name] said that his dentist said that he won't need any invasive dental procedure in the near future.  He had bone pain and low grade fever with Zometa in the past.  I advised him to go ahead with Zometa to decrease the risk of skeletal related event.  He expressed informed understanding and wished to proceed.   3.  History of PE and positive hereditary hypercoagulable state.  He is on on Coumadin per PCP.   4.  Follow up: lab only in about 1 month and visit in about 2 months.     The length of time of the face-to-face encounter was 15 minutes. More than 50% of time was spent counseling and coordination of care.

## 2012-07-02 IMAGING — CR DG CHEST 1V
1 series · 1 of 1 positions shown · non-contrast
Comparison: 01/08/2011

CLINICAL DATA: Rule out pneumonia

CHEST - 1 VIEW

[view not recorded]
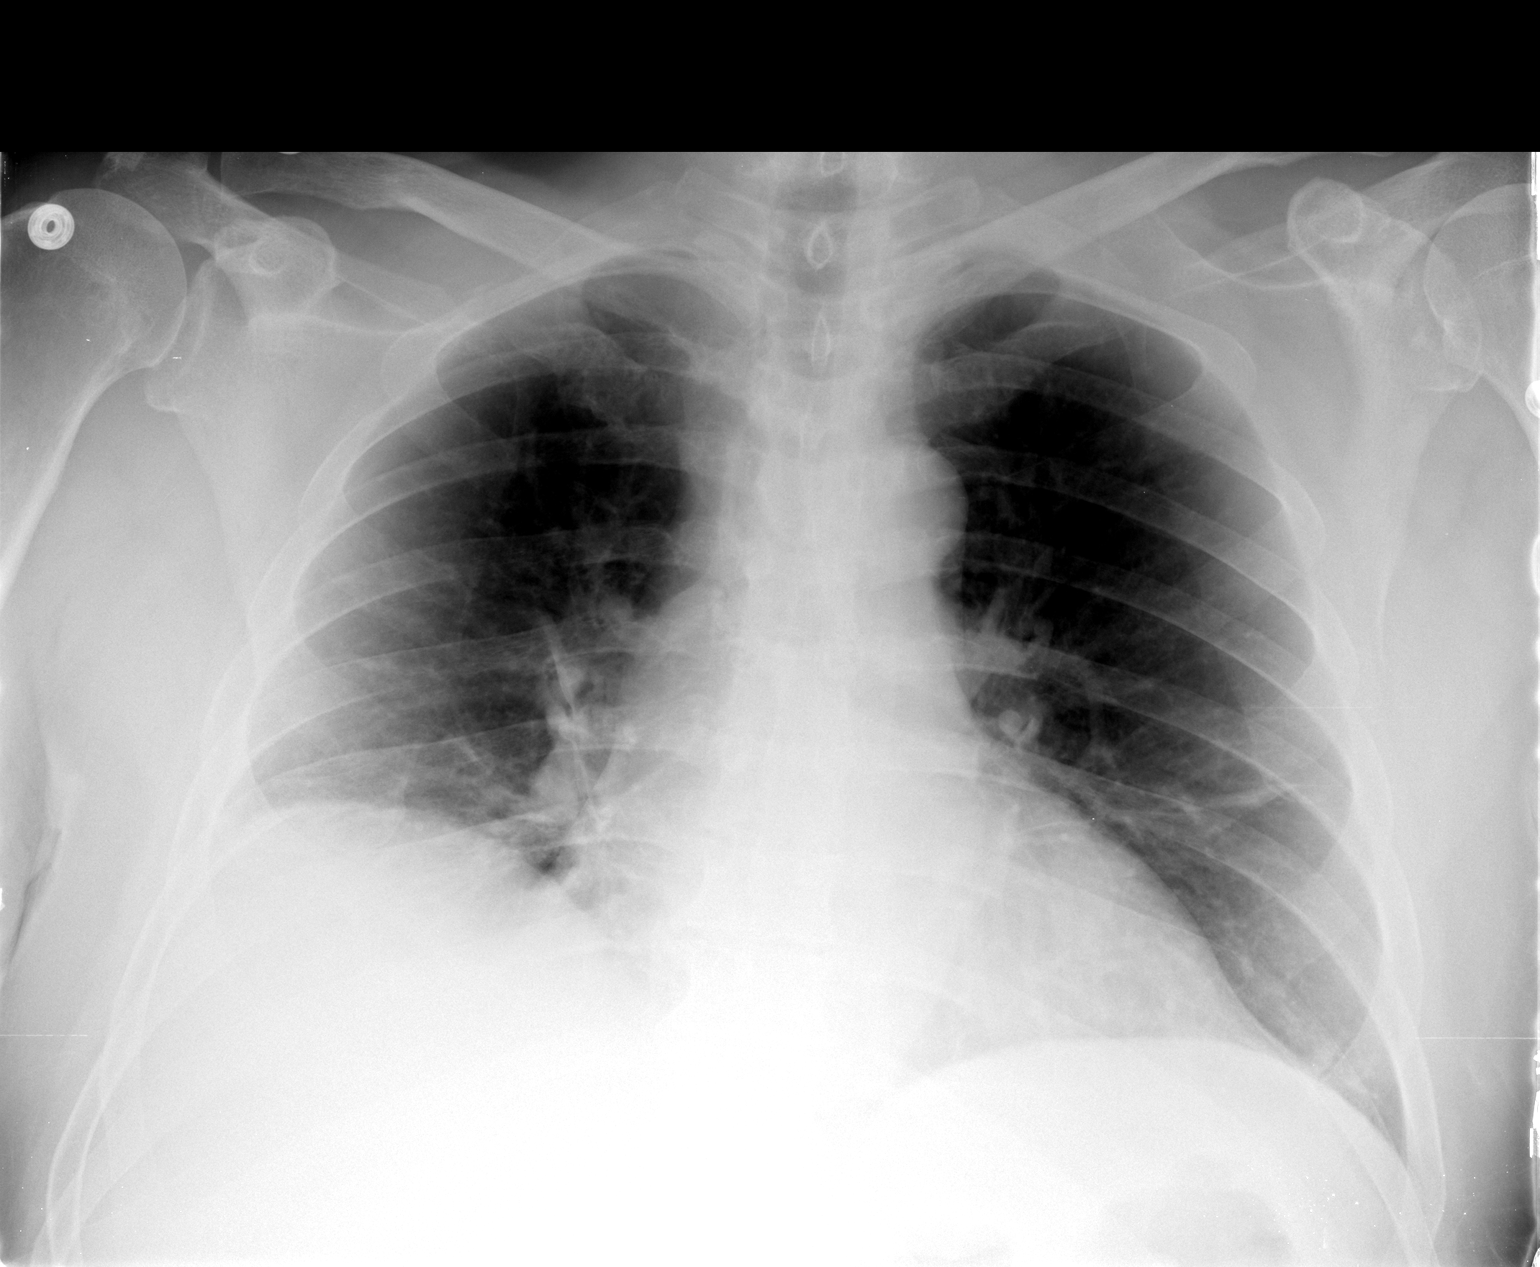

[1 of 1 positions shown; findings below may reference images not displayed]

FINDINGS: Cardiomediastinal silhouette is stable.  Mild elevation
of the right hemidiaphragm with right basilar atelectasis.  Stable
linear atelectasis or scarring in the lingula.  No acute infiltrate
or convincing edema.
IMPRESSION: Mild elevation of the right hemidiaphragm with right basilar
atelectasis.  Stable linear atelectasis or scarring in the lingula.
No acute infiltrate or convincing edema.

## 2012-07-10 DIAGNOSIS — C9001 Multiple myeloma in remission: Secondary | ICD-10-CM | POA: Insufficient documentation

## 2012-07-10 DIAGNOSIS — Z8781 Personal history of (healed) traumatic fracture: Secondary | ICD-10-CM

## 2012-07-10 HISTORY — DX: Personal history of (healed) traumatic fracture: Z87.81

## 2012-07-22 ENCOUNTER — Ambulatory Visit (INDEPENDENT_AMBULATORY_CARE_PROVIDER_SITE_OTHER): Payer: Medicare Other | Admitting: Family

## 2012-07-22 DIAGNOSIS — Z86718 Personal history of other venous thrombosis and embolism: Secondary | ICD-10-CM

## 2012-07-22 NOTE — Patient Instructions (Addendum)
Continue same dose, 5 mg (1 pill). Everyday, Check in 4 weeks  Anticoagulation Dose Instructions as of 07/22/2012     Glynis Smiles Tue Wed Thu Fri Sat   New Dose 5 mg 5 mg 5 mg 5 mg 5 mg 5 mg 5 mg    Description       Continue same dose, 5 mg (1 pill). Everyday, Check in 4 weeks

## 2012-07-24 ENCOUNTER — Telehealth: Payer: Self-pay | Admitting: Internal Medicine

## 2012-07-24 ENCOUNTER — Other Ambulatory Visit: Payer: Medicare Other | Admitting: Lab

## 2012-07-24 NOTE — Telephone Encounter (Signed)
Pt request a script for HYDROcodone-acetaminophen (VICODIN) 5-325 MG per tablet . Pt is having infusions once a month for his cancer and this helps w/ the body aches.  Art therapist

## 2012-07-25 ENCOUNTER — Ambulatory Visit (HOSPITAL_BASED_OUTPATIENT_CLINIC_OR_DEPARTMENT_OTHER): Payer: Medicare Other

## 2012-07-25 ENCOUNTER — Other Ambulatory Visit: Payer: Self-pay | Admitting: *Deleted

## 2012-07-25 ENCOUNTER — Other Ambulatory Visit (HOSPITAL_BASED_OUTPATIENT_CLINIC_OR_DEPARTMENT_OTHER): Payer: Medicare Other

## 2012-07-25 ENCOUNTER — Other Ambulatory Visit: Payer: Self-pay | Admitting: Oncology

## 2012-07-25 VITALS — BP 119/78 | HR 69 | Temp 97.4°F | Resp 20

## 2012-07-25 DIAGNOSIS — C9000 Multiple myeloma not having achieved remission: Secondary | ICD-10-CM

## 2012-07-25 DIAGNOSIS — C9002 Multiple myeloma in relapse: Secondary | ICD-10-CM

## 2012-07-25 LAB — CBC WITH DIFFERENTIAL/PLATELET
BASO%: 4.8 % — ABNORMAL HIGH (ref 0.0–2.0)
LYMPH%: 24.3 % (ref 14.0–49.0)
MCHC: 34.1 g/dL (ref 32.0–36.0)
MONO#: 0.6 10*3/uL (ref 0.1–0.9)
Platelets: 193 10*3/uL (ref 140–400)
RBC: 4.61 10*6/uL (ref 4.20–5.82)
RDW: 15.7 % — ABNORMAL HIGH (ref 11.0–14.6)
nRBC: 0 % (ref 0–0)

## 2012-07-25 LAB — COMPREHENSIVE METABOLIC PANEL (CC13)
ALT: 35 U/L (ref 0–55)
AST: 21 U/L (ref 5–34)
Calcium: 8.2 mg/dL — ABNORMAL LOW (ref 8.4–10.4)
Chloride: 111 mEq/L — ABNORMAL HIGH (ref 98–107)
Creatinine: 1.2 mg/dL (ref 0.7–1.3)
Sodium: 139 mEq/L (ref 136–145)
Total Bilirubin: 0.56 mg/dL (ref 0.20–1.20)

## 2012-07-25 MED ORDER — HYDROCODONE-ACETAMINOPHEN 5-325 MG PO TABS: 1.0000 | ORAL_TABLET | ORAL | Status: DC | PRN

## 2012-07-25 MED ORDER — SODIUM CHLORIDE 0.9 % IV SOLN
INTRAVENOUS | Status: AC
  Administered 2012-07-25: 09:00:00 via INTRAVENOUS

## 2012-07-25 MED ORDER — ZOLEDRONIC ACID 4 MG/5ML IV CONC
4.0000 mg | Freq: Once | INTRAVENOUS | Status: AC
  Administered 2012-07-25: 4 mg via INTRAVENOUS
  Filled 2012-07-25: qty 5

## 2012-07-25 NOTE — Patient Instructions (Addendum)
Zoledronic Acid injection (Paget's Disease, Osteoporosis) What is this medicine? ZOLEDRONIC ACID (ZOE le dron ik AS id) lowers the amount of calcium loss from bone. It is used to treat Paget's disease and osteoporosis in women. This medicine may be used for other purposes; ask your health care provider or pharmacist if you have questions. What should I tell my health care provider before I take this medicine? They need to know if you have any of these conditions: -aspirin-sensitive asthma -dental disease -kidney disease -low levels of calcium in the blood -past surgery on the parathyroid gland or intestines -an unusual or allergic reaction to zoledronic acid, other medicines, foods, dyes, or preservatives -pregnant or trying to get pregnant -breast-feeding How should I use this medicine? This medicine is for infusion into a vein. It is given by a health care professional in a hospital or clinic setting. Talk to your pediatrician regarding the use of this medicine in children. This medicine is not approved for use in children. Overdosage: If you think you have taken too much of this medicine contact a poison control center or emergency room at once. NOTE: This medicine is only for you. Do not share this medicine with others. What if I miss a dose? It is important not to miss your dose. Call your doctor or health care professional if you are unable to keep an appointment. What may interact with this medicine? -certain antibiotics given by injection -NSAIDs, medicines for pain and inflammation, like ibuprofen or naproxen -some diuretics like bumetanide, furosemide -teriparatide This list may not describe all possible interactions. Give your health care provider a list of all the medicines, herbs, non-prescription drugs, or dietary supplements you use. Also tell them if you smoke, drink alcohol, or use illegal drugs. Some items may interact with your medicine. What should I watch for while  using this medicine? Visit your doctor or health care professional for regular checkups. It may be some time before you see the benefit from this medicine. Do not stop taking your medicine unless your doctor tells you to. Your doctor may order blood tests or other tests to see how you are doing. Women should inform their doctor if they wish to become pregnant or think they might be pregnant. There is a potential for serious side effects to an unborn child. Talk to your health care professional or pharmacist for more information. You should make sure that you get enough calcium and vitamin D while you are taking this medicine. Discuss the foods you eat and the vitamins you take with your health care professional. Some people who take this medicine have severe bone, joint, and/or muscle pain. This medicine may also increase your risk for a broken thigh bone. Tell your doctor right away if you have pain in your upper leg or groin. Tell your doctor if you have any pain that does not go away or that gets worse. What side effects may I notice from receiving this medicine? Side effects that you should report to your doctor or health care professional as soon as possible: -allergic reactions like skin rash, itching or hives, swelling of the face, lips, or tongue -breathing problems -changes in vision -feeling faint or lightheaded, falls -jaw burning, cramping, or pain -muscle cramps, stiffness, or weakness -trouble passing urine or change in the amount of urine Side effects that usually do not require medical attention (report to your doctor or health care professional if they continue or are bothersome): -bone, joint, or muscle pain -fever -  irritation at site where injected -loss of appetite -nausea, vomiting -stomach upset -tired This list may not describe all possible side effects. Call your doctor for medical advice about side effects. You may report side effects to FDA at 1-800-FDA-1088. Where  should I keep my medicine? This drug is given in a hospital or clinic and will not be stored at home. NOTE: This sheet is a summary. It may not cover all possible information. If you have questions about this medicine, talk to your doctor, pharmacist, or health care provider.  2013, [employer] Standard. (08/18/2010 9:08:15 AM)  

## 2012-07-25 NOTE — Telephone Encounter (Signed)
Pt informed

## 2012-08-19 ENCOUNTER — Ambulatory Visit (INDEPENDENT_AMBULATORY_CARE_PROVIDER_SITE_OTHER): Payer: Medicare Other | Admitting: Family

## 2012-08-19 DIAGNOSIS — C9000 Multiple myeloma not having achieved remission: Secondary | ICD-10-CM

## 2012-08-19 DIAGNOSIS — Z86718 Personal history of other venous thrombosis and embolism: Secondary | ICD-10-CM

## 2012-08-19 DIAGNOSIS — C801 Malignant (primary) neoplasm, unspecified: Secondary | ICD-10-CM

## 2012-08-19 LAB — POCT INR: INR: 2.4

## 2012-08-19 NOTE — Patient Instructions (Addendum)
Continue same dose, 5 mg (1 pill). Everyday, Check in 4 weeks  Anticoagulation Dose Instructions as of 08/19/2012     Rodney [name] Tue Wed Thu Fri Sat   New Dose 5 mg 5 mg 5 mg 5 mg 5 mg 5 mg 5 mg    Description       Continue same dose, 5 mg (1 pill). Everyday, Check in 4 weeks

## 2012-08-22 ENCOUNTER — Ambulatory Visit (HOSPITAL_BASED_OUTPATIENT_CLINIC_OR_DEPARTMENT_OTHER): Payer: Medicare Other

## 2012-08-22 VITALS — BP 117/74 | HR 60 | Temp 97.7°F

## 2012-08-22 DIAGNOSIS — C9000 Multiple myeloma not having achieved remission: Secondary | ICD-10-CM

## 2012-08-22 MED ORDER — ZOLEDRONIC ACID 4 MG/100ML IV SOLN
4.0000 mg | Freq: Once | INTRAVENOUS | Status: AC
  Administered 2012-08-22: 4 mg via INTRAVENOUS
  Filled 2012-08-22: qty 100

## 2012-08-22 MED ORDER — SODIUM CHLORIDE 0.9 % IV SOLN
Freq: Once | INTRAVENOUS | Status: AC
  Administered 2012-08-22: 09:00:00 via INTRAVENOUS

## 2012-08-22 NOTE — Patient Instructions (Addendum)
Zoledronic Acid injection (Hypercalcemia, Oncology) What is this medicine? ZOLEDRONIC ACID (ZOE le dron ik AS id) lowers the amount of calcium loss from bone. It is used to treat too much calcium in your blood from cancer. It is also used to prevent complications of cancer that has spread to the bone. This medicine may be used for other purposes; ask your health care provider or pharmacist if you have questions. What should I tell my health care provider before I take this medicine? They need to know if you have any of these conditions: -aspirin-sensitive asthma -dental disease -kidney disease -an unusual or allergic reaction to zoledronic acid, other medicines, foods, dyes, or preservatives -pregnant or trying to get pregnant -breast-feeding How should I use this medicine? This medicine is for infusion into a vein. It is given by a health care professional in a hospital or clinic setting. Talk to your pediatrician regarding the use of this medicine in children. Special care may be needed. Overdosage: If you think you have taken too much of this medicine contact a poison control center or emergency room at once. NOTE: This medicine is only for you. Do not share this medicine with others. What if I miss a dose? It is important not to miss your dose. Call your doctor or health care professional if you are unable to keep an appointment. What may interact with this medicine? -certain antibiotics given by injection -NSAIDs, medicines for pain and inflammation, like ibuprofen or naproxen -some diuretics like bumetanide, furosemide -teriparatide -thalidomide This list may not describe all possible interactions. Give your health care provider a list of all the medicines, herbs, non-prescription drugs, or dietary supplements you use. Also tell them if you smoke, drink alcohol, or use illegal drugs. Some items may interact with your medicine. What should I watch for while using this medicine? Visit  your doctor or health care professional for regular checkups. It may be some time before you see the benefit from this medicine. Do not stop taking your medicine unless your doctor tells you to. Your doctor may order blood tests or other tests to see how you are doing. Women should inform their doctor if they wish to become pregnant or think they might be pregnant. There is a potential for serious side effects to an unborn child. Talk to your health care professional or pharmacist for more information. You should make sure that you get enough calcium and vitamin D while you are taking this medicine. Discuss the foods you eat and the vitamins you take with your health care professional. Some people who take this medicine have severe bone, joint, and/or muscle pain. This medicine may also increase your risk for a broken thigh bone. Tell your doctor right away if you have pain in your upper leg or groin. Tell your doctor if you have any pain that does not go away or that gets worse. What side effects may I notice from receiving this medicine? Side effects that you should report to your doctor or health care professional as soon as possible: -allergic reactions like skin rash, itching or hives, swelling of the face, lips, or tongue -anxiety, confusion, or depression -breathing problems -changes in vision -feeling faint or lightheaded, falls -jaw burning, cramping, pain -muscle cramps, stiffness, or weakness -trouble passing urine or change in the amount of urine Side effects that usually do not require medical attention (report to your doctor or health care professional if they continue or are bothersome): -bone, joint, or muscle pain -  fever -hair loss -irritation at site where injected -loss of appetite -nausea, vomiting -stomach upset -tired This list may not describe all possible side effects. Call your doctor for medical advice about side effects. You may report side effects to FDA at  1-800-FDA-1088. Where should I keep my medicine? This drug is given in a hospital or clinic and will not be stored at home. NOTE: This sheet is a summary. It may not cover all possible information. If you have questions about this medicine, talk to your doctor, pharmacist, or health care provider.  2012, [employer]vier/Gold Standard. (08/18/2010 9:06:58 AM) 

## 2012-08-27 ENCOUNTER — Ambulatory Visit (HOSPITAL_BASED_OUTPATIENT_CLINIC_OR_DEPARTMENT_OTHER): Payer: Medicare Other | Admitting: Oncology

## 2012-08-27 ENCOUNTER — Telehealth: Payer: Self-pay | Admitting: Internal Medicine

## 2012-08-27 ENCOUNTER — Other Ambulatory Visit (HOSPITAL_BASED_OUTPATIENT_CLINIC_OR_DEPARTMENT_OTHER): Payer: Medicare Other | Admitting: Lab

## 2012-08-27 VITALS — BP 110/74 | HR 71 | Temp 97.4°F | Resp 18 | Ht 71.0 in | Wt 226.0 lb

## 2012-08-27 DIAGNOSIS — C9000 Multiple myeloma not having achieved remission: Secondary | ICD-10-CM

## 2012-08-27 DIAGNOSIS — C801 Malignant (primary) neoplasm, unspecified: Secondary | ICD-10-CM

## 2012-08-27 DIAGNOSIS — M79606 Pain in leg, unspecified: Secondary | ICD-10-CM

## 2012-08-27 DIAGNOSIS — C9001 Multiple myeloma in remission: Secondary | ICD-10-CM

## 2012-08-27 LAB — CBC WITH DIFFERENTIAL/PLATELET
BASO%: 4.9 % — ABNORMAL HIGH (ref 0.0–2.0)
LYMPH%: 18.9 % (ref 14.0–49.0)
MCHC: 34.5 g/dL (ref 32.0–36.0)
MONO#: 0.9 10*3/uL (ref 0.1–0.9)
Platelets: 199 10*3/uL (ref 140–400)
RBC: 4.5 10*6/uL (ref 4.20–5.82)
RDW: 17.3 % — ABNORMAL HIGH (ref 11.0–14.6)
WBC: 6.4 10*3/uL (ref 4.0–10.3)

## 2012-08-27 LAB — COMPREHENSIVE METABOLIC PANEL (CC13)
AST: 34 U/L (ref 5–34)
Alkaline Phosphatase: 101 U/L (ref 40–150)
BUN: 16.5 mg/dL (ref 7.0–26.0)
Creatinine: 1.4 mg/dL — ABNORMAL HIGH (ref 0.7–1.3)
Potassium: 4.1 mEq/L (ref 3.5–5.1)

## 2012-08-27 MED ORDER — OXYCODONE-ACETAMINOPHEN 5-325 MG PO TABS: 1.0000 | ORAL_TABLET | Freq: Four times a day (QID) | ORAL | Status: DC | PRN

## 2012-08-27 NOTE — Telephone Encounter (Signed)
PT states that when he seen his Oncologist today, he was instructed to see a physical therapist due to the pain in his legs. He described nerve pain, and the Oncologist said that physical therapy would help. Please assist.

## 2012-08-27 NOTE — Progress Notes (Signed)
[facility]  Telephone:(336) [phone] Fax:(336) [phone]   OFFICE PROGRESS NOTE   Cc:  [name], MD  DIAGNOSIS: history of plasmacytoma and then myeloma.   PAST THERAPY:   Induction chemo, palliative radiation, autologous BMT in 12/2011.   CURRENT THERAPY:  Started on maintenance Revlimid 10mg  PO daily (no day off) in 06/2012.    INTERVAL HISTORY: Rodney [name] 72 y.o. male returns for regular follow up with his wife.  He has mild leg cramp on Revlimid.  After each dose of Zometa, he had low grade fever, bone/muscle ache.  Vicodin did not improve his pain; Percocet did.  He has chronic mild fatigue; however, he is able to perform all activities of daily living.  He has right lateral hip pain. Pain is worst when he lies on his right hip; improved with walking around.  He denied lower back pain, bowel/bladder incontinence, mucositis, nausea/vomiting, bleeding, skin rash, calf/thigh swelling.   Past Medical History  Diagnosis Date  . Pancreatitis   . Pulmonary embolism   . Complication of anesthesia 02-16-11    Pt. speaks of awakening during the surgery from back  surgery  . Shortness of breath 02-16-11    hx. Pulmonary emboli x2 (9 yrs/ 3'12 -last)  . Anemia 02-16-11    01-05-11- post surgery-Transfusions x 4 units  . Prostate cancer   . Multiple myeloma 02-16-11    suspected tumor  left sacral  . Degenerative disc disease 02-16-11    01-05-11 L3 fusion done  . Hernia 02-16-11    left inguinal hernia at present    Past Surgical History  Procedure Laterality Date  . Back surgery  02-16-11     01-05-11 Lumbar surgery L3(complicated by loss of blood volume)/ 01-09-11 then Lumbar fusion done with retained  hardware   . Cholecystectomy  04/06/2010    laparoscopic-inflammation with stones  . Inguinal hernia repair  02/20/2011    Procedure: HERNIA REPAIR INGUINAL ADULT;  Surgeon: Velora Heckler, MD;  Location: [facility];  Service: General;  Laterality: Left;   Repair Left Inguinal Hernia with Mesh  . Hernia repair  02/20/11    Inguinal hernia repair w/mesh    Current Outpatient Prescriptions  Medication Sig Dispense Refill  . acetaminophen (TYLENOL) 325 MG tablet Take 650 mg by mouth every 6 (six) hours as needed for pain.      . calcium carbonate (TUMS - DOSED IN MG ELEMENTAL CALCIUM) 500 MG chewable tablet Chew 1 tablet by mouth every 2 (two) hours as needed. HEART BURN        . levothyroxine (SYNTHROID, LEVOTHROID) 100 MCG tablet Take 100 mcg by mouth daily.      . Loratadine (CLARITIN) 10 MG CAPS Take 10 mg by mouth daily.      . methylcellulose (ARTIFICIAL TEARS) 1 % ophthalmic solution Place 2 drops into both eyes 2 (two) times daily as needed. DRY EYES       . omeprazole (PRILOSEC) 20 MG capsule Take 20 mg by mouth 2 (two) times daily as needed. HEART BURN       . REVLIMID 10 MG capsule Take 10 mg by mouth daily.      . valACYclovir (VALTREX) 500 MG tablet Take 500 mg by mouth daily.      Marland Kitchen warfarin (COUMADIN) 5 MG tablet Take 5 mg by mouth daily.      Marland Kitchen HYDROcodone-acetaminophen (NORCO/VICODIN) 5-325 MG per tablet Take 1 tablet by mouth every 4 (four) hours as  needed for pain.  30 tablet  1  . oxyCODONE-acetaminophen (PERCOCET/ROXICET) 5-325 MG per tablet Take 1 tablet by mouth every 6 (six) hours as needed for pain.  30 tablet  0   No current facility-administered medications for this visit.    ALLERGIES:  is allergic to blood-group specific substance.  REVIEW OF SYSTEMS:  The rest of the 14-point review of system was negative.   Filed Vitals:   08/27/12 1331  BP: 110/74  Pulse: 71  Temp: 97.4 F (36.3 C)  Resp: 18   Wt Readings from Last 3 Encounters:  08/27/12 226 lb (102.513 kg)  06/27/12 224 lb (101.606 kg)  05/28/12 229 lb 9.6 oz (104.146 kg)   ECOG Performance status: 0-1  PHYSICAL EXAMINATION:   General:  well-nourished man, in no acute distress.  Eyes:  no scleral icterus.  ENT:  There were no oropharyngeal  lesions.  Neck was without thyromegaly.  Lymphatics:  Negative cervical, supraclavicular or axillary adenopathy.  Respiratory: lungs were clear bilaterally without wheezing or crackles.  Cardiovascular:  Regular rate and rhythm, S1/S2, without murmur, rub or gallop.  There was no pedal edema.  GI:  abdomen was soft, flat, nontender, nondistended, without organomegaly.  Muscoloskeletal:  no spinal tenderness of palpation of vertebral spine.  Skin exam was without echymosis, petichae.  Neuro exam was nonfocal.  Patient was able to get on and off exam table without assistance.  Gait was normal.  Patient was alert and oriented.  Attention was good.   Language was appropriate.  Mood was normal without depression.  Speech was not pressured.  Thought content was not tangential.         LABORATORY/RADIOLOGY DATA:  Lab Results  Component Value Date   WBC 6.4 08/27/2012   HGB 14.1 08/27/2012   HCT 41.0 08/27/2012   PLT 199 08/27/2012   GLUCOSE 100* 08/27/2012   CHOL 147 07/25/2010   TRIG 76 07/25/2010   HDL 43 07/25/2010   LDLDIRECT 98.3 05/31/2008   LDLCALC  Value: 89        Total Cholesterol/HDL:CHD Risk Coronary Heart Disease Risk Table                     Men   Women  1/2 Average Risk   3.4   3.3  Average Risk       5.0   4.4  2 X Average Risk   9.6   7.1  3 X Average Risk  23.4   11.0        Use the calculated Patient Ratio above and the CHD Risk Table to determine the patient's CHD Risk.        ATP III CLASSIFICATION (LDL):  <100     mg/dL   Optimal  161-096  mg/dL   Near or Above                    Optimal  130-159  mg/dL   Borderline  045-409  mg/dL   High  >811     mg/dL   Very High 11/17/7827   ALKPHOS 101 08/27/2012   ALT 45 08/27/2012   AST 34 08/27/2012   NA 141 08/27/2012   K 4.1 08/27/2012   CL 112* 08/27/2012   CREATININE 1.4* 08/27/2012   BUN 16.5 08/27/2012   CO2 21* 08/27/2012   PSA  Value: <0.01 (NOTE) Result repeated and verified. Test Methodology: [employer] PSA* 07/26/2010   INR 2.4 08/19/2012  ASSESSMENT AND PLAN:    1.  History of plasmacytoma and myeloma:  Doing well on maintenance Revlimid without side effects.  I recommend to continue at current dose at 10mg  PO daily 1-28 every 28 day cycle.  He has grade 1 fatigue.  In the future, if he has significantly worsened fatigue, we may consider dose reduction or build in 7 days off per cycle.    2.  Lytic bone lesions:  He has been on monthly Zometa.  He had grade 3 bone pain, fever.  These resolved with Percocet.  I prescribed 30 tablet of 5/325mg  Percocet prn q8hour for the 3 days after each dose of Zometa.   3.  History of PE and positive hereditary hypercoagulable state.  He is on on Coumadin per PCP.   4.  Right lateral hip pain:  Most likely sciatica.  I advised him to discuss with PCP and referral to PT.   5.  Follow up: lab and Zometa monthly.  Return visit in about 3 months.  I informed Mr. Pickelsimer that I am leaving the practice.  [facility] to see another provider when he returns.      The length of time of the face-to-face encounter was 15 minutes. More than 50% of time was spent counseling and coordination of care.       Huan T. Gaylyn Rong, M.D.

## 2012-08-27 NOTE — Telephone Encounter (Signed)
Ok to send to physical therapy

## 2012-08-28 ENCOUNTER — Telehealth: Payer: Self-pay | Admitting: Oncology

## 2012-08-29 LAB — PROTEIN ELECTROPHORESIS, SERUM
Albumin ELP: 54.4 % — ABNORMAL LOW (ref 55.8–66.1)
Alpha-2-Globulin: 10.9 % (ref 7.1–11.8)
Total Protein, Serum Electrophoresis: 6.9 g/dL (ref 6.0–8.3)

## 2012-08-29 LAB — KAPPA/LAMBDA LIGHT CHAINS
Kappa free light chain: 3.31 mg/dL — ABNORMAL HIGH (ref 0.33–1.94)
Lambda Free Lght Chn: 3.34 mg/dL — ABNORMAL HIGH (ref 0.57–2.63)

## 2012-09-01 ENCOUNTER — Ambulatory Visit: Payer: Medicare Other

## 2012-09-03 ENCOUNTER — Ambulatory Visit: Payer: Medicare Other | Attending: Internal Medicine

## 2012-09-03 DIAGNOSIS — IMO0001 Reserved for inherently not codable concepts without codable children: Secondary | ICD-10-CM | POA: Insufficient documentation

## 2012-09-03 DIAGNOSIS — R5381 Other malaise: Secondary | ICD-10-CM | POA: Insufficient documentation

## 2012-09-03 DIAGNOSIS — M545 Low back pain, unspecified: Secondary | ICD-10-CM | POA: Insufficient documentation

## 2012-09-03 DIAGNOSIS — M25569 Pain in unspecified knee: Secondary | ICD-10-CM | POA: Insufficient documentation

## 2012-09-03 DIAGNOSIS — M25659 Stiffness of unspecified hip, not elsewhere classified: Secondary | ICD-10-CM | POA: Insufficient documentation

## 2012-09-08 ENCOUNTER — Ambulatory Visit: Payer: Medicare Other

## 2012-09-15 ENCOUNTER — Ambulatory Visit: Payer: Medicare Other

## 2012-09-18 ENCOUNTER — Other Ambulatory Visit: Payer: Self-pay | Admitting: Oncology

## 2012-09-18 ENCOUNTER — Ambulatory Visit: Payer: Medicare Other

## 2012-09-19 ENCOUNTER — Ambulatory Visit (HOSPITAL_BASED_OUTPATIENT_CLINIC_OR_DEPARTMENT_OTHER): Payer: Medicare Other

## 2012-09-19 ENCOUNTER — Other Ambulatory Visit (HOSPITAL_BASED_OUTPATIENT_CLINIC_OR_DEPARTMENT_OTHER): Payer: Medicare Other | Admitting: Lab

## 2012-09-19 VITALS — BP 120/73 | HR 60 | Temp 98.6°F

## 2012-09-19 DIAGNOSIS — C9000 Multiple myeloma not having achieved remission: Secondary | ICD-10-CM

## 2012-09-19 LAB — CBC WITH DIFFERENTIAL/PLATELET
Basophils Absolute: 0.2 10*3/uL — ABNORMAL HIGH (ref 0.0–0.1)
Eosinophils Absolute: 0.2 10*3/uL (ref 0.0–0.5)
HCT: 39.7 % (ref 38.4–49.9)
HGB: 13.5 g/dL (ref 13.0–17.1)
MCV: 91.3 fL (ref 79.3–98.0)
MONO%: 15.5 % — ABNORMAL HIGH (ref 0.0–14.0)
NEUT#: 2 10*3/uL (ref 1.5–6.5)
NEUT%: 46.4 % (ref 39.0–75.0)
RDW: 16.4 % — ABNORMAL HIGH (ref 11.0–14.6)

## 2012-09-19 LAB — COMPREHENSIVE METABOLIC PANEL (CC13)
Albumin: 3.6 g/dL (ref 3.5–5.0)
BUN: 19.3 mg/dL (ref 7.0–26.0)
CO2: 23 mEq/L (ref 22–29)
Glucose: 85 mg/dl (ref 70–140)
Potassium: 4.4 mEq/L (ref 3.5–5.1)
Sodium: 138 mEq/L (ref 136–145)
Total Bilirubin: 0.5 mg/dL (ref 0.20–1.20)
Total Protein: 6.9 g/dL (ref 6.4–8.3)

## 2012-09-19 MED ORDER — ZOLEDRONIC ACID 4 MG/5ML IV CONC
4.0000 mg | Freq: Once | INTRAVENOUS | Status: AC
  Administered 2012-09-19: 4 mg via INTRAVENOUS
  Filled 2012-09-19: qty 5

## 2012-09-19 NOTE — Patient Instructions (Addendum)
Zoledronic Acid injection (Hypercalcemia, Oncology) What is this medicine? ZOLEDRONIC ACID (ZOE le dron ik AS id) lowers the amount of calcium loss from bone. It is used to treat too much calcium in your blood from cancer. It is also used to prevent complications of cancer that has spread to the bone. This medicine may be used for other purposes; ask your health care provider or pharmacist if you have questions. What should I tell my health care provider before I take this medicine? They need to know if you have any of these conditions: -aspirin-sensitive asthma -dental disease -kidney disease -an unusual or allergic reaction to zoledronic acid, other medicines, foods, dyes, or preservatives -pregnant or trying to get pregnant -breast-feeding How should I use this medicine? This medicine is for infusion into a vein. It is given by a health care professional in a hospital or clinic setting. Talk to your pediatrician regarding the use of this medicine in children. Special care may be needed. Overdosage: If you think you have taken too much of this medicine contact a poison control center or emergency room at once. NOTE: This medicine is only for you. Do not share this medicine with others. What if I miss a dose? It is important not to miss your dose. Call your doctor or health care professional if you are unable to keep an appointment. What may interact with this medicine? -certain antibiotics given by injection -NSAIDs, medicines for pain and inflammation, like ibuprofen or naproxen -some diuretics like bumetanide, furosemide -teriparatide -thalidomide This list may not describe all possible interactions. Give your health care provider a list of all the medicines, herbs, non-prescription drugs, or dietary supplements you use. Also tell them if you smoke, drink alcohol, or use illegal drugs. Some items may interact with your medicine. What should I watch for while using this medicine? Visit  your doctor or health care professional for regular checkups. It may be some time before you see the benefit from this medicine. Do not stop taking your medicine unless your doctor tells you to. Your doctor may order blood tests or other tests to see how you are doing. Women should inform their doctor if they wish to become pregnant or think they might be pregnant. There is a potential for serious side effects to an unborn child. Talk to your health care professional or pharmacist for more information. You should make sure that you get enough calcium and vitamin D while you are taking this medicine. Discuss the foods you eat and the vitamins you take with your health care professional. Some people who take this medicine have severe bone, joint, and/or muscle pain. This medicine may also increase your risk for a broken thigh bone. Tell your doctor right away if you have pain in your upper leg or groin. Tell your doctor if you have any pain that does not go away or that gets worse. What side effects may I notice from receiving this medicine? Side effects that you should report to your doctor or health care professional as soon as possible: -allergic reactions like skin rash, itching or hives, swelling of the face, lips, or tongue -anxiety, confusion, or depression -breathing problems -changes in vision -feeling faint or lightheaded, falls -jaw burning, cramping, pain -muscle cramps, stiffness, or weakness -trouble passing urine or change in the amount of urine Side effects that usually do not require medical attention (report to your doctor or health care professional if they continue or are bothersome): -bone, joint, or muscle pain -  fever -hair loss -irritation at site where injected -loss of appetite -nausea, vomiting -stomach upset -tired This list may not describe all possible side effects. Call your doctor for medical advice about side effects. You may report side effects to FDA at  1-800-FDA-1088. Where should I keep my medicine? This drug is given in a hospital or clinic and will not be stored at home. NOTE: This sheet is a summary. It may not cover all possible information. If you have questions about this medicine, talk to your doctor, pharmacist, or health care provider.  2013, [employer]. (08/18/2010 9:06:58 AM)  

## 2012-09-22 ENCOUNTER — Ambulatory Visit: Payer: Medicare Other

## 2012-09-22 ENCOUNTER — Ambulatory Visit (INDEPENDENT_AMBULATORY_CARE_PROVIDER_SITE_OTHER): Payer: Medicare Other | Admitting: General Practice

## 2012-09-22 ENCOUNTER — Encounter: Payer: Medicare Other | Admitting: Family

## 2012-09-22 DIAGNOSIS — C9000 Multiple myeloma not having achieved remission: Secondary | ICD-10-CM

## 2012-09-22 DIAGNOSIS — D689 Coagulation defect, unspecified: Secondary | ICD-10-CM

## 2012-09-22 DIAGNOSIS — C801 Malignant (primary) neoplasm, unspecified: Secondary | ICD-10-CM

## 2012-09-22 DIAGNOSIS — Z86718 Personal history of other venous thrombosis and embolism: Secondary | ICD-10-CM

## 2012-09-22 LAB — POCT INR: INR: 2.8

## 2012-09-23 ENCOUNTER — Ambulatory Visit: Payer: Medicare Other

## 2012-09-25 ENCOUNTER — Ambulatory Visit: Payer: Medicare Other

## 2012-09-30 ENCOUNTER — Ambulatory Visit: Payer: Medicare Other

## 2012-10-02 ENCOUNTER — Ambulatory Visit: Payer: Medicare Other

## 2012-10-08 ENCOUNTER — Other Ambulatory Visit: Payer: Self-pay

## 2012-10-17 ENCOUNTER — Ambulatory Visit (HOSPITAL_BASED_OUTPATIENT_CLINIC_OR_DEPARTMENT_OTHER): Payer: Medicare Other

## 2012-10-17 ENCOUNTER — Other Ambulatory Visit (HOSPITAL_BASED_OUTPATIENT_CLINIC_OR_DEPARTMENT_OTHER): Payer: Medicare Other | Admitting: Lab

## 2012-10-17 VITALS — BP 128/86 | HR 65 | Temp 97.0°F | Resp 20

## 2012-10-17 DIAGNOSIS — C9000 Multiple myeloma not having achieved remission: Secondary | ICD-10-CM

## 2012-10-17 DIAGNOSIS — C9001 Multiple myeloma in remission: Secondary | ICD-10-CM

## 2012-10-17 LAB — COMPREHENSIVE METABOLIC PANEL (CC13)
AST: 26 U/L (ref 5–34)
Albumin: 3.7 g/dL (ref 3.5–5.0)
Alkaline Phosphatase: 86 U/L (ref 40–150)
Calcium: 8.7 mg/dL (ref 8.4–10.4)
Chloride: 111 mEq/L — ABNORMAL HIGH (ref 98–109)
Potassium: 4 mEq/L (ref 3.5–5.1)
Sodium: 139 mEq/L (ref 136–145)
Total Protein: 7.3 g/dL (ref 6.4–8.3)

## 2012-10-17 LAB — CBC WITH DIFFERENTIAL/PLATELET
BASO%: 3.7 % — ABNORMAL HIGH (ref 0.0–2.0)
Basophils Absolute: 0.2 10*3/uL — ABNORMAL HIGH (ref 0.0–0.1)
EOS%: 3.5 % (ref 0.0–7.0)
HCT: 40 % (ref 38.4–49.9)
HGB: 13.9 g/dL (ref 13.0–17.1)
MCH: 32.3 pg (ref 27.2–33.4)
MCHC: 34.8 g/dL (ref 32.0–36.0)
MONO#: 0.5 10*3/uL (ref 0.1–0.9)
NEUT%: 54.4 % (ref 39.0–75.0)
RDW: 16.7 % — ABNORMAL HIGH (ref 11.0–14.6)
WBC: 4.3 10*3/uL (ref 4.0–10.3)
lymph#: 1.2 10*3/uL (ref 0.9–3.3)

## 2012-10-17 MED ORDER — SODIUM CHLORIDE 0.9 % IV SOLN
INTRAVENOUS | Status: DC
  Administered 2012-10-17: 09:00:00 via INTRAVENOUS

## 2012-10-17 MED ORDER — ZOLEDRONIC ACID 4 MG/100ML IV SOLN
4.0000 mg | Freq: Once | INTRAVENOUS | Status: AC
  Administered 2012-10-17: 4 mg via INTRAVENOUS
  Filled 2012-10-17: qty 100

## 2012-10-17 NOTE — Patient Instructions (Addendum)
[facility] Discharge Instructions for Patients Receiving Chemotherapy  Today you received the following chemotherapy agents: Zometa   To help prevent nausea and vomiting after your treatment, we encourage you to take your nausea medication as directed by your physician and medical team.   If you develop nausea and vomiting that is not controlled by your nausea medication, call the clinic.   BELOW ARE SYMPTOMS THAT SHOULD BE REPORTED IMMEDIATELY:  *FEVER GREATER THAN 100.5 F  *CHILLS WITH OR WITHOUT FEVER  NAUSEA AND VOMITING THAT IS NOT CONTROLLED WITH YOUR NAUSEA MEDICATION  *UNUSUAL SHORTNESS OF BREATH  *UNUSUAL BRUISING OR BLEEDING  TENDERNESS IN MOUTH AND THROAT WITH OR WITHOUT PRESENCE OF ULCERS  *URINARY PROBLEMS  *BOWEL PROBLEMS  UNUSUAL RASH Items with * indicate a potential emergency and should be followed up as soon as possible.  Feel free to call the clinic you have any questions or concerns. The clinic phone number is [phone].

## 2012-10-20 ENCOUNTER — Ambulatory Visit (INDEPENDENT_AMBULATORY_CARE_PROVIDER_SITE_OTHER): Payer: Medicare Other | Admitting: General Practice

## 2012-10-20 DIAGNOSIS — Z7901 Long term (current) use of anticoagulants: Secondary | ICD-10-CM

## 2012-10-20 DIAGNOSIS — D689 Coagulation defect, unspecified: Secondary | ICD-10-CM

## 2012-10-20 DIAGNOSIS — Z86718 Personal history of other venous thrombosis and embolism: Secondary | ICD-10-CM

## 2012-11-06 IMAGING — CR DG CHEST 2V
2 series · 2 of 2 positions shown · non-contrast
Comparison: Chest radiograph 01/12/2011

CLINICAL DATA: Fever and cough

CHEST - 2 VIEW

[w chest pa]
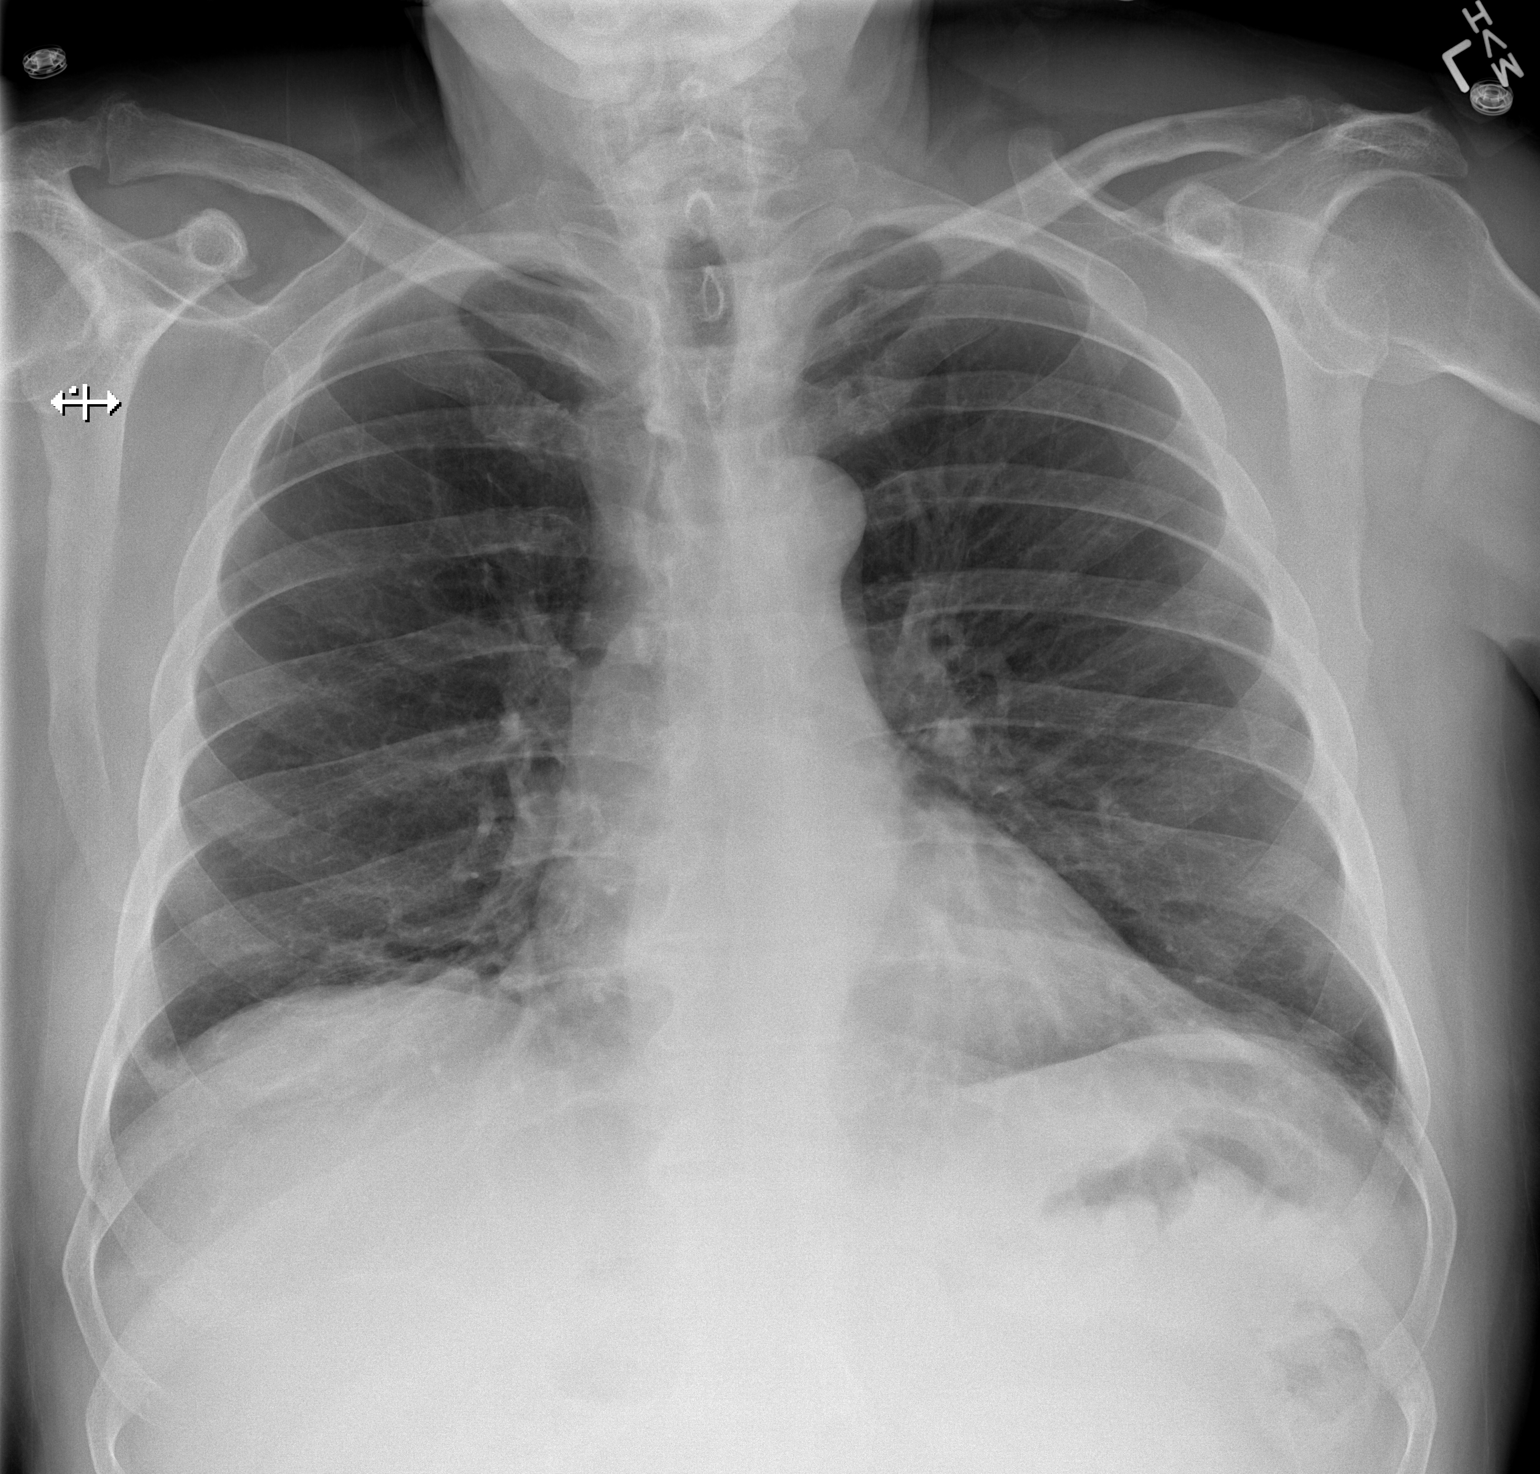

[w chest lat]
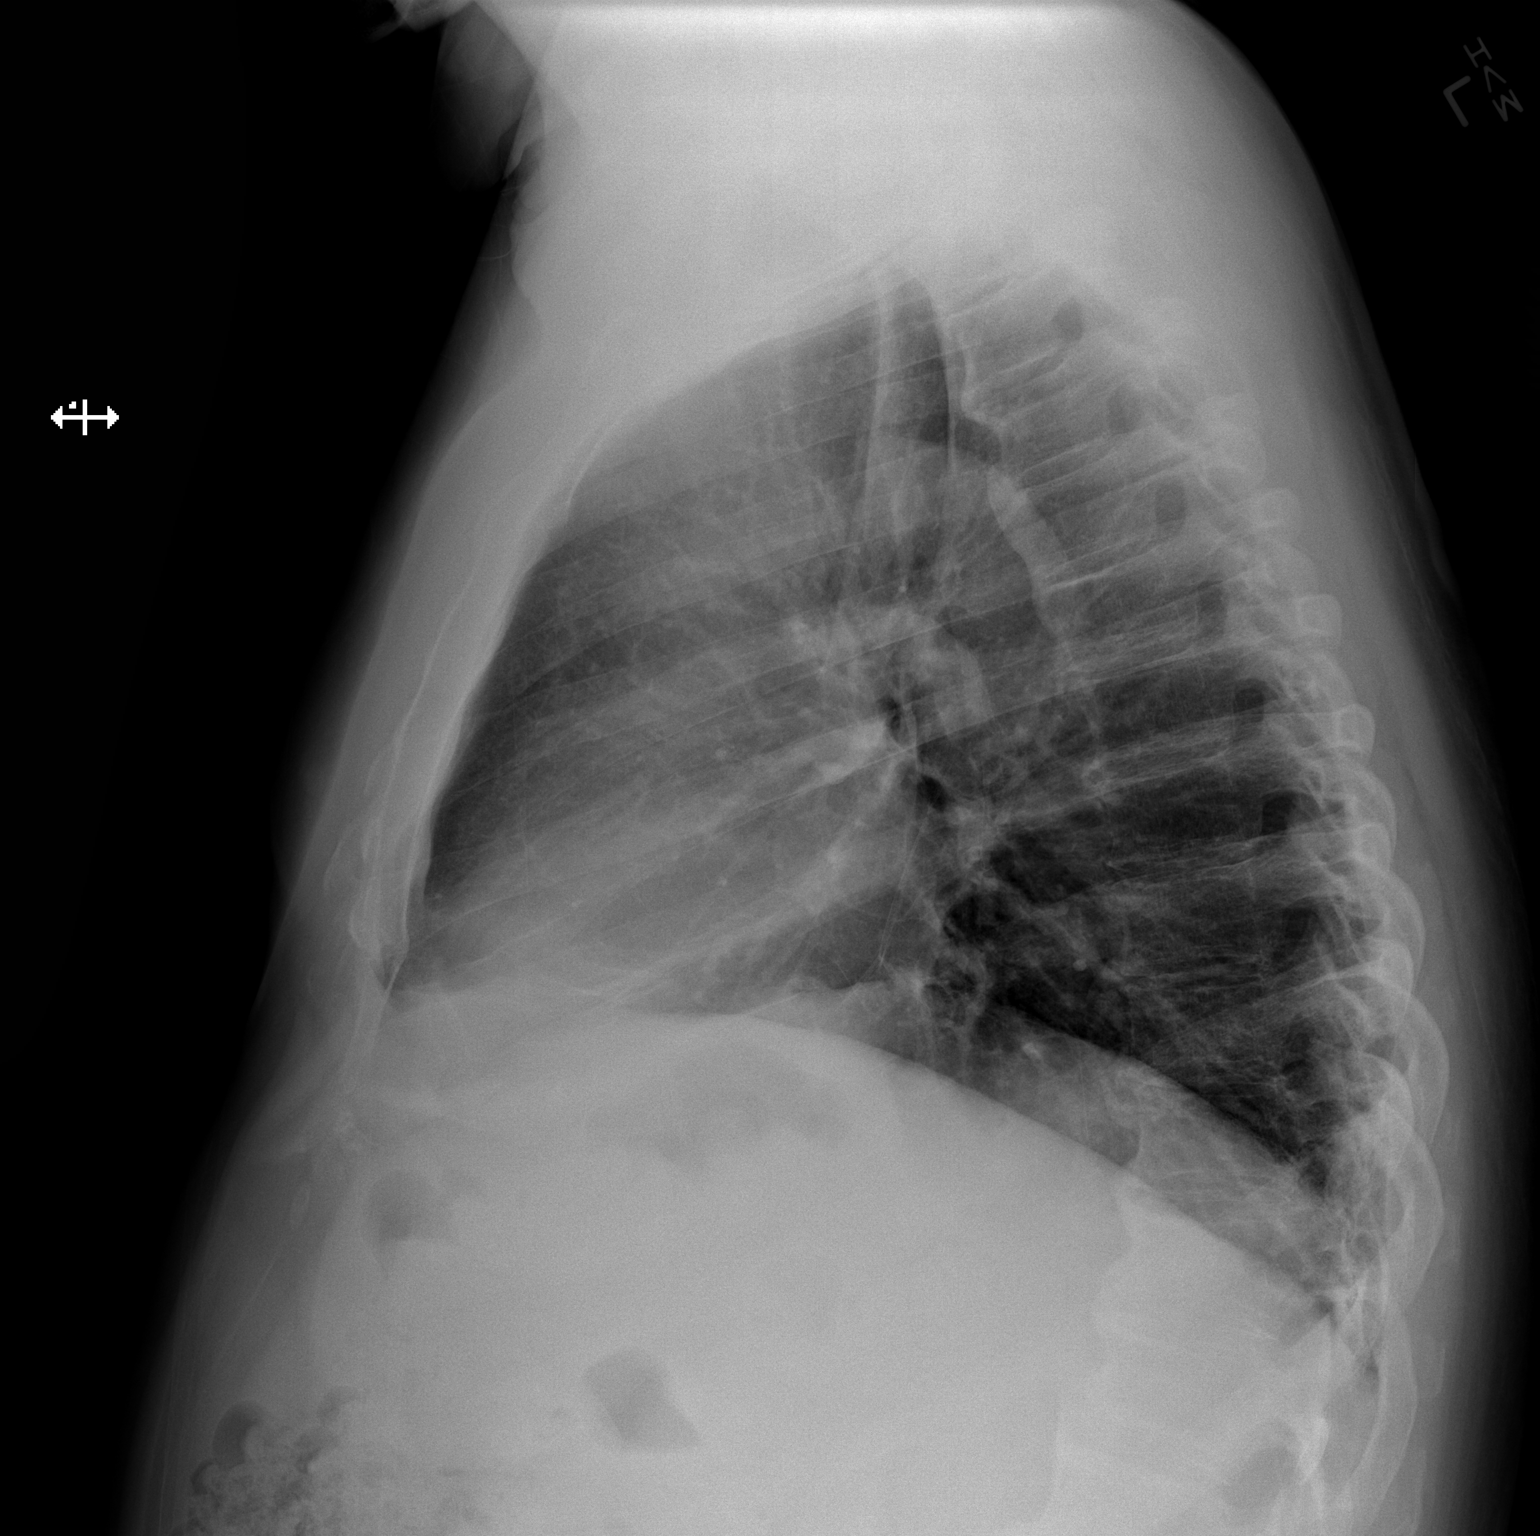

[2 of 2 positions shown; findings below may reference images not displayed]

FINDINGS: Normal cardiac silhouette.  There is mild atelectasis of
the lung bases which is similar to prior.  No focal consolidation.
No pneumothorax. Degenerative osteophytosis of the thoracic spine.
IMPRESSION: 1..  No acute findings.
2.  Mild basilar atelectasis.

## 2012-11-14 ENCOUNTER — Telehealth: Payer: Self-pay | Admitting: Hematology and Oncology

## 2012-11-14 ENCOUNTER — Telehealth: Payer: Self-pay | Admitting: *Deleted

## 2012-11-14 ENCOUNTER — Ambulatory Visit: Payer: Medicare Other

## 2012-11-14 ENCOUNTER — Other Ambulatory Visit: Payer: Medicare Other

## 2012-11-14 NOTE — Telephone Encounter (Signed)
Patient called and left regarding his appts. I was going to transfer the patient to the desk RN, when he received another call from the center. Patient disconnected with me and answered the another line.   JMW

## 2012-11-14 NOTE — Telephone Encounter (Signed)
Returned pt's call re r/s 9/12 appts. Pt given new appt for lb/NG/inf 9/24.

## 2012-11-17 ENCOUNTER — Ambulatory Visit (INDEPENDENT_AMBULATORY_CARE_PROVIDER_SITE_OTHER): Payer: Medicare Other | Admitting: General Practice

## 2012-11-17 DIAGNOSIS — D689 Coagulation defect, unspecified: Secondary | ICD-10-CM

## 2012-11-17 DIAGNOSIS — Z86718 Personal history of other venous thrombosis and embolism: Secondary | ICD-10-CM

## 2012-11-17 DIAGNOSIS — Z7901 Long term (current) use of anticoagulants: Secondary | ICD-10-CM

## 2012-11-26 ENCOUNTER — Telehealth: Payer: Self-pay | Admitting: Hematology and Oncology

## 2012-11-26 ENCOUNTER — Ambulatory Visit (HOSPITAL_BASED_OUTPATIENT_CLINIC_OR_DEPARTMENT_OTHER): Payer: Medicare Other

## 2012-11-26 ENCOUNTER — Encounter: Payer: Self-pay | Admitting: Hematology and Oncology

## 2012-11-26 ENCOUNTER — Other Ambulatory Visit (HOSPITAL_BASED_OUTPATIENT_CLINIC_OR_DEPARTMENT_OTHER): Payer: Medicare Other | Admitting: Lab

## 2012-11-26 ENCOUNTER — Ambulatory Visit (HOSPITAL_BASED_OUTPATIENT_CLINIC_OR_DEPARTMENT_OTHER): Payer: Medicare Other | Admitting: Hematology and Oncology

## 2012-11-26 VITALS — BP 129/77 | HR 65 | Temp 97.2°F | Resp 20 | Ht 71.0 in | Wt 228.0 lb

## 2012-11-26 DIAGNOSIS — I441 Atrioventricular block, second degree: Secondary | ICD-10-CM

## 2012-11-26 DIAGNOSIS — C9000 Multiple myeloma not having achieved remission: Secondary | ICD-10-CM

## 2012-11-26 DIAGNOSIS — C801 Malignant (primary) neoplasm, unspecified: Secondary | ICD-10-CM

## 2012-11-26 HISTORY — DX: Atrioventricular block, second degree: I44.1

## 2012-11-26 LAB — COMPREHENSIVE METABOLIC PANEL (CC13)
ALT: 35 U/L (ref 0–55)
AST: 28 U/L (ref 5–34)
Albumin: 3.4 g/dL — ABNORMAL LOW (ref 3.5–5.0)
Alkaline Phosphatase: 112 U/L (ref 40–150)
CO2: 19 mEq/L — ABNORMAL LOW (ref 22–29)
Calcium: 8.3 mg/dL — ABNORMAL LOW (ref 8.4–10.4)
Chloride: 111 mEq/L — ABNORMAL HIGH (ref 98–109)
Creatinine: 1.1 mg/dL (ref 0.7–1.3)
Glucose: 94 mg/dl (ref 70–140)
Potassium: 3.8 mEq/L (ref 3.5–5.1)
Sodium: 140 mEq/L (ref 136–145)
Total Protein: 7 g/dL (ref 6.4–8.3)

## 2012-11-26 LAB — CBC WITH DIFFERENTIAL/PLATELET
BASO%: 2.8 % — ABNORMAL HIGH (ref 0.0–2.0)
EOS%: 5.3 % (ref 0.0–7.0)
HCT: 38 % — ABNORMAL LOW (ref 38.4–49.9)
MCH: 32.9 pg (ref 27.2–33.4)
MCHC: 34.6 g/dL (ref 32.0–36.0)
MONO#: 1.1 10*3/uL — ABNORMAL HIGH (ref 0.1–0.9)
MONO%: 19 % — ABNORMAL HIGH (ref 0.0–14.0)
NEUT%: 50.4 % (ref 39.0–75.0)
RBC: 3.99 10*6/uL — ABNORMAL LOW (ref 4.20–5.82)
WBC: 5.8 10*3/uL (ref 4.0–10.3)
lymph#: 1.3 10*3/uL (ref 0.9–3.3)

## 2012-11-26 LAB — LACTATE DEHYDROGENASE (CC13): LDH: 144 U/L (ref 125–245)

## 2012-11-26 MED ORDER — ZOLEDRONIC ACID 4 MG/100ML IV SOLN
4.0000 mg | Freq: Once | INTRAVENOUS | Status: AC
  Administered 2012-11-26: 4 mg via INTRAVENOUS
  Filled 2012-11-26: qty 100

## 2012-11-26 NOTE — Progress Notes (Signed)
[facility] OFFICE PROGRESS NOTE  [name], MD [address] [place] [address]   DIAGNOSIS: ISS stage I lambda light chain multiple myeloma, status post autologous stem cell transplant  SUMMARY OF ONCOLOGIC HISTORY: The patient was initially diagnosed in 2012. He had chemotherapy followed by autologous stem cell transplant [facility] he October 2013. In April 2014 he was started on maintain this Revlimid 10 mg daily by mouth along with monthly Zometa  INTERVAL HISTORY: Rodney [name] 72 y.o. male returns for followup visit prior to his cycle 12 of monthly Zometa. He is doing very well. His major complaint is chronic back pain. He is rating his pain as minor that is diffuse. He has taken pain medicine in the past of which he did not find helpful. He discontinued all his pain medication recently. He denies any evidence of poor dentition. His son is a Education officer, community and have look inside his mouth 6 weeks ago. Denies any recent bleeding no epistaxis hematuria or hematochezia. His primary doctor monitor monitor his anticoagulation therapy. His appetite stable no recent weight loss.  I have reviewed the past medical history, past surgical history, social history and family history with the patient and they are unchanged from previous note.  ALLERGIES:  is allergic to blood-group specific substance.  MEDICATIONS: has a current medication list which includes the following prescription(s): calcium carbonate, cholecalciferol, levothyroxine, loratadine, methylcellulose, omeprazole, revlimid, valacyclovir, warfarin, acetaminophen, and acetaminophen.  REVIEW OF SYSTEMS:   Constitutional: Denies fevers, chills or abnormal weight loss Eyes: Denies blurriness of vision Ears, nose, mouth, throat, and face: Denies mucositis or sore throat Respiratory: Denies cough, dyspnea or wheezes Cardiovascular: Denies palpitation, chest discomfort or  lower extremity swelling Gastrointestinal:  Denies nausea, heartburn or change in bowel habits Skin: Denies abnormal skin rashes Lymphatics: Denies new lymphadenopathy or easy bruising Neurological:Denies numbness, tingling or new weaknesses Behavioral/Psych: Mood is stable, no new changes  All other systems were reviewed with the patient and are negative.  PHYSICAL EXAMINATION: ECOG PERFORMANCE STATUS: 1 - Symptomatic but completely ambulatory  Filed Vitals:   11/26/12 1214  BP: 129/77  Pulse: 65  Temp: 97.2 F (36.2 C)  Resp: 20    GENERAL:alert, no distress and comfortable SKIN: skin color, texture, turgor are normal, no rashes or significant lesions EYES: normal, Conjunctiva are pink and non-injected, sclera clear OROPHARYNX:no exudate, no erythema and lips, buccal mucosa, and tongue normal  NECK: supple, thyroid normal size, non-tender, without nodularity LYMPH:  no palpable lymphadenopathy in the cervical, axillary or inguinal LUNGS: clear to auscultation and percussion with normal breathing effort HEART: regular rate & rhythm and no murmurs and no lower extremity edema ABDOMEN:abdomen soft, non-tender and normal bowel sounds Musculoskeletal:no cyanosis of digits and no clubbing  NEURO: alert & oriented x 3 with fluent speech, no focal motor/sensory deficits  LABORATORY DATA:  I have reviewed the data as listed    Component Value Date/Time   NA 140 11/26/2012 1134   NA 136 04/06/2012 1950   K 3.8 11/26/2012 1134   K 3.9 04/06/2012 1950   CL 112* 08/27/2012 1309   CL 103 04/06/2012 1950   CO2 19* 11/26/2012 1134   CO2 22 04/06/2012 1950   GLUCOSE 94 11/26/2012 1134   GLUCOSE 100* 08/27/2012 1309   GLUCOSE 110* 04/06/2012 1950   BUN 14.7 11/26/2012 1134   BUN 19 04/06/2012 1950   CREATININE 1.1 11/26/2012 1134   CREATININE 1.25 04/06/2012 1950  CREATININE 0.91 01/01/2011 1630   CALCIUM 8.3* 11/26/2012 1134   CALCIUM 8.4 04/06/2012 1950   PROT 7.0 11/26/2012 1134   PROT 6.5  12/24/2011 1643   ALBUMIN 3.4* 11/26/2012 1134   ALBUMIN 4.0 12/24/2011 1643   AST 28 11/26/2012 1134   AST 21 12/24/2011 1643   ALT 35 11/26/2012 1134   ALT 39 12/24/2011 1643   ALKPHOS 112 11/26/2012 1134   ALKPHOS 119* 12/24/2011 1643   BILITOT 0.45 11/26/2012 1134   BILITOT 0.3 12/24/2011 1643   GFRNONAA 57* 04/06/2012 1950   GFRAA 66* 04/06/2012 1950    I No results found for this basename: SPEP, UPEP,  kappa and lambda light chains    Lab Results  Component Value Date   WBC 5.8 11/26/2012   NEUTROABS 2.9 11/26/2012   HGB 13.1 11/26/2012   HCT 38.0* 11/26/2012   MCV 95.1 11/26/2012   PLT 173 11/26/2012      Chemistry      Component Value Date/Time   NA 140 11/26/2012 1134   NA 136 04/06/2012 1950   K 3.8 11/26/2012 1134   K 3.9 04/06/2012 1950   CL 112* 08/27/2012 1309   CL 103 04/06/2012 1950   CO2 19* 11/26/2012 1134   CO2 22 04/06/2012 1950   BUN 14.7 11/26/2012 1134   BUN 19 04/06/2012 1950   CREATININE 1.1 11/26/2012 1134   CREATININE 1.25 04/06/2012 1950   CREATININE 0.91 01/01/2011 1630      Component Value Date/Time   CALCIUM 8.3* 11/26/2012 1134   CALCIUM 8.4 04/06/2012 1950   ALKPHOS 112 11/26/2012 1134   ALKPHOS 119* 12/24/2011 1643   AST 28 11/26/2012 1134   AST 21 12/24/2011 1643   ALT 35 11/26/2012 1134   ALT 39 12/24/2011 1643   BILITOT 0.45 11/26/2012 1134   BILITOT 0.3 12/24/2011 1643     ASSESSMENT: ISS stage I lambda light chain multiple myeloma, status post autologous stem cell transplant   PLAN:  #1 multiple myeloma He'll continue maintenance Revlimid for about 2 years. He continue monthly Zometa for now. He tolerated treatment well with no side effects. He has regular dental visits and has no evidence of osteonecrosis of the jaw. He was discharged on physician again in 2 months time we will continue treatment with a dose adjustment #2 chronic back pain The patient has declined pain medicine. I recommended a trial of high-dose vitamin D with calcium supplement #3  preventive care You will need to continue antimicrobial prophylaxis with acyclovir. I recommend influenza vaccination. According to the patient had received some vaccinations recently at his transplant physician office. #4 history of prostate cancer According to the patient his last PSA was negative #5 history of clot You continue anticoagulation therapy indefinitely.  All questions were answered. The patient knows to call the clinic with any problems, questions or concerns. We can certainly see the patient much sooner if necessary. No barriers to learning was detected. I spent 25 minutes counseling the patient face to face. The total time spent in the appointment was 40 minutes and more than 50% was on counseling.     [facility], Lorann Tani, MD 11/26/2012 12:45 PM

## 2012-11-26 NOTE — Patient Instructions (Addendum)
Zoledronic Acid injection (Hypercalcemia, Oncology) What is this medicine? ZOLEDRONIC ACID (ZOE le dron ik AS id) lowers the amount of calcium loss from bone. It is used to treat too much calcium in your blood from cancer. It is also used to prevent complications of cancer that has spread to the bone. This medicine may be used for other purposes; ask your health care provider or pharmacist if you have questions. What should I tell my health care provider before I take this medicine? They need to know if you have any of these conditions: -aspirin-sensitive asthma -dental disease -kidney disease -an unusual or allergic reaction to zoledronic acid, other medicines, foods, dyes, or preservatives -pregnant or trying to get pregnant -breast-feeding How should I use this medicine? This medicine is for infusion into a vein. It is given by a health care professional in a hospital or clinic setting. Talk to your pediatrician regarding the use of this medicine in children. Special care may be needed. Overdosage: If you think you have taken too much of this medicine contact a poison control center or emergency room at once. NOTE: This medicine is only for you. Do not share this medicine with others. What if I miss a dose? It is important not to miss your dose. Call your doctor or health care professional if you are unable to keep an appointment. What may interact with this medicine? -certain antibiotics given by injection -NSAIDs, medicines for pain and inflammation, like ibuprofen or naproxen -some diuretics like bumetanide, furosemide -teriparatide -thalidomide This list may not describe all possible interactions. Give your health care provider a list of all the medicines, herbs, non-prescription drugs, or dietary supplements you use. Also tell them if you smoke, drink alcohol, or use illegal drugs. Some items may interact with your medicine. What should I watch for while using this medicine? Visit  your doctor or health care professional for regular checkups. It may be some time before you see the benefit from this medicine. Do not stop taking your medicine unless your doctor tells you to. Your doctor may order blood tests or other tests to see how you are doing. Women should inform their doctor if they wish to become pregnant or think they might be pregnant. There is a potential for serious side effects to an unborn child. Talk to your health care professional or pharmacist for more information. You should make sure that you get enough calcium and vitamin D while you are taking this medicine. Discuss the foods you eat and the vitamins you take with your health care professional. Some people who take this medicine have severe bone, joint, and/or muscle pain. This medicine may also increase your risk for a broken thigh bone. Tell your doctor right away if you have pain in your upper leg or groin. Tell your doctor if you have any pain that does not go away or that gets worse. What side effects may I notice from receiving this medicine? Side effects that you should report to your doctor or health care professional as soon as possible: -allergic reactions like skin rash, itching or hives, swelling of the face, lips, or tongue -anxiety, confusion, or depression -breathing problems -changes in vision -feeling faint or lightheaded, falls -jaw burning, cramping, pain -muscle cramps, stiffness, or weakness -trouble passing urine or change in the amount of urine Side effects that usually do not require medical attention (report to your doctor or health care professional if they continue or are bothersome): -bone, joint, or muscle pain -  fever -hair loss -irritation at site where injected -loss of appetite -nausea, vomiting -stomach upset -tired This list may not describe all possible side effects. Call your doctor for medical advice about side effects. You may report side effects to FDA at  1-800-FDA-1088. Where should I keep my medicine? This drug is given in a hospital or clinic and will not be stored at home. NOTE: This sheet is a summary. It may not cover all possible information. If you have questions about this medicine, talk to your doctor, pharmacist, or health care provider.  2013, [employer]. (08/18/2010 9:06:58 AM)  

## 2012-11-26 NOTE — Telephone Encounter (Signed)
gv pt appt schedule for October.  °

## 2012-11-26 NOTE — Progress Notes (Signed)
Pt requested zometa infuse over 30 minutes to see if that will decrease any side effects.

## 2012-12-02 LAB — SPEP & IFE WITH QIG
Alpha-1-Globulin: 5.6 % — ABNORMAL HIGH (ref 2.9–4.9)
Beta 2: 3.9 % (ref 3.2–6.5)
Gamma Globulin: 16.8 % (ref 11.1–18.8)
IgA: 284 mg/dL (ref 68–379)
IgG (Immunoglobin G), Serum: 974 mg/dL (ref 650–1600)
IgM, Serum: 40 mg/dL — ABNORMAL LOW (ref 41–251)
Total Protein, Serum Electrophoresis: 6.4 g/dL (ref 6.0–8.3)

## 2012-12-02 LAB — KAPPA/LAMBDA LIGHT CHAINS: Kappa:Lambda Ratio: 0.94 (ref 0.26–1.65)

## 2012-12-02 LAB — IGG, IGA, IGM: IgG (Immunoglobin G), Serum: 974 mg/dL (ref 650–1600)

## 2012-12-02 LAB — BETA 2 MICROGLOBULIN, SERUM: Beta-2 Microglobulin: 1.86 mg/L — ABNORMAL HIGH (ref 1.01–1.73)

## 2012-12-10 ENCOUNTER — Telehealth: Payer: Self-pay | Admitting: Internal Medicine

## 2012-12-10 NOTE — Telephone Encounter (Signed)
Pt has sore throat and congestion. Pt decline appt. Pt would like bonnye to return his call.

## 2012-12-10 NOTE — Telephone Encounter (Signed)
Dr Lovell Sheehan out of office this week- can try otc mucinex for drainage and gargle with saline wawter- if not better will need to come in and be seen by md

## 2012-12-12 ENCOUNTER — Other Ambulatory Visit: Payer: Medicare Other | Admitting: Lab

## 2012-12-12 ENCOUNTER — Ambulatory Visit: Payer: Medicare Other

## 2012-12-15 ENCOUNTER — Ambulatory Visit (INDEPENDENT_AMBULATORY_CARE_PROVIDER_SITE_OTHER): Payer: Medicare Other | Admitting: General Practice

## 2012-12-15 ENCOUNTER — Ambulatory Visit (INDEPENDENT_AMBULATORY_CARE_PROVIDER_SITE_OTHER): Payer: Medicare Other | Admitting: Family

## 2012-12-15 ENCOUNTER — Encounter: Payer: Self-pay | Admitting: Family

## 2012-12-15 VITALS — BP 118/80 | HR 81 | Temp 98.6°F | Wt 224.0 lb

## 2012-12-15 DIAGNOSIS — Z7901 Long term (current) use of anticoagulants: Secondary | ICD-10-CM

## 2012-12-15 DIAGNOSIS — R05 Cough: Secondary | ICD-10-CM

## 2012-12-15 DIAGNOSIS — D689 Coagulation defect, unspecified: Secondary | ICD-10-CM

## 2012-12-15 DIAGNOSIS — J069 Acute upper respiratory infection, unspecified: Secondary | ICD-10-CM

## 2012-12-15 DIAGNOSIS — Z86718 Personal history of other venous thrombosis and embolism: Secondary | ICD-10-CM

## 2012-12-15 DIAGNOSIS — R059 Cough, unspecified: Secondary | ICD-10-CM

## 2012-12-15 DIAGNOSIS — Z859 Personal history of malignant neoplasm, unspecified: Secondary | ICD-10-CM

## 2012-12-15 LAB — POCT INR: INR: 2.2

## 2012-12-15 NOTE — Patient Instructions (Signed)

## 2012-12-16 LAB — CBC WITH DIFFERENTIAL/PLATELET
Basophils Relative: 0.3 % (ref 0.0–3.0)
Eosinophils Relative: 2.9 % (ref 0.0–5.0)
Hemoglobin: 13.3 g/dL (ref 13.0–17.0)
Lymphocytes Relative: 16 % (ref 12.0–46.0)
Lymphs Abs: 1.2 10*3/uL (ref 0.7–4.0)
MCHC: 33.8 g/dL (ref 30.0–36.0)
Monocytes Absolute: 1 10*3/uL (ref 0.1–1.0)
Monocytes Relative: 13.3 % — ABNORMAL HIGH (ref 3.0–12.0)
Neutrophils Relative %: 67.5 % (ref 43.0–77.0)
Platelets: 227 10*3/uL (ref 150.0–400.0)
RBC: 4.09 Mil/uL — ABNORMAL LOW (ref 4.22–5.81)
WBC: 7.7 10*3/uL (ref 4.5–10.5)

## 2012-12-16 NOTE — Progress Notes (Signed)
Subjective:    Patient ID: Rodney [name], male    DOB: [birth year], 72 y.o.   MRN: [id number]  HPI   72 year old white male, nonsmoker is in today with complaints of cough, congestion, fatigue comes for days. He feels that he is getting better now. Has been taking Mucinex to help. He has a history of cancer therefore, he reports he always like to be checked just to be sure he is okay.  Review of Systems  Constitutional: Negative.   HENT: Positive for congestion, postnasal drip and rhinorrhea.   Respiratory: Positive for cough. Negative for wheezing.   Gastrointestinal: Negative.   Musculoskeletal: Negative.   Skin: Negative.   Allergic/Immunologic: Negative.   Neurological: Negative.   Psychiatric/Behavioral: Negative.    Past Medical History  Diagnosis Date  . Pancreatitis   . Pulmonary embolism   . Complication of anesthesia 02-16-11    Pt. speaks of awakening during the surgery from back  surgery  . Shortness of breath 02-16-11    hx. Pulmonary emboli x2 (9 yrs/ 3'12 -last)  . Anemia 02-16-11    01-05-11- post surgery-Transfusions x 4 units  . Prostate cancer   . Multiple myeloma 02-16-11    suspected tumor  left sacral  . Degenerative disc disease 02-16-11    01-05-11 L3 fusion done  . Hernia 02-16-11    left inguinal hernia at present    History   Social History  . Marital Status: Married    Spouse Name: N/A    Number of Children: N/A  . Years of Education: N/A   Occupational History  . Not on file.   Social History Main Topics  . Smoking status: Former Smoker    Quit date: 02/15/1969  . Smokeless tobacco: Never Used     Comment: quit 50 yrs  . Alcohol Use: No  . Drug Use: No  . Sexual Activity: Yes   Other Topics Concern  . Not on file   Social History Narrative   Married   Regular exercise-yes    Past Surgical History  Procedure Laterality Date  . Back surgery  02-16-11     01-05-11 Lumbar surgery L3(complicated by loss of blood volume)/  01-09-11 then Lumbar fusion done with retained  hardware   . Cholecystectomy  04/06/2010    laparoscopic-inflammation with stones  . Inguinal hernia repair  02/20/2011    Procedure: HERNIA REPAIR INGUINAL ADULT;  Surgeon: Velora Heckler, MD;  Location: [facility];  Service: General;  Laterality: Left;  Repair Left Inguinal Hernia with Mesh  . Hernia repair  02/20/11    Inguinal hernia repair w/mesh  . Prostate surgery Bilateral 2011    Family History  Problem Relation Age of Onset  . Lymphoma Father 69  . Cancer Father   . Clotting disorder Mother     blood clots    Allergies  Allergen Reactions  . Blood-Group Specific Substance     Must be Leukocyte Reduced and Irradiated due to stem cell transplant.     Current Outpatient Prescriptions on File Prior to Visit  Medication Sig Dispense Refill  . acetaminophen (TYLENOL) 325 MG tablet Take 650 mg by mouth every 6 (six) hours as needed for pain.      Marland Kitchen acetaminophen (TYLENOL) 325 MG tablet Take 650 mg by mouth. Take 650 mg by mouth every six (6) hours as needed.      . calcium carbonate (TUMS - DOSED IN MG ELEMENTAL CALCIUM) 500 MG chewable tablet Chew 1  tablet by mouth every 2 (two) hours as needed. HEART BURN        . cholecalciferol (VITAMIN D) 400 UNITS TABS tablet Take 400 Units by mouth 2 (two) times daily.      Marland Kitchen levothyroxine (SYNTHROID, LEVOTHROID) 100 MCG tablet Take 100 mcg by mouth daily.      . Loratadine (CLARITIN) 10 MG CAPS Take 10 mg by mouth daily.      . methylcellulose (ARTIFICIAL TEARS) 1 % ophthalmic solution Place 2 drops into both eyes 2 (two) times daily as needed. DRY EYES       . omeprazole (PRILOSEC) 20 MG capsule Take 20 mg by mouth 2 (two) times daily as needed. HEART BURN       . REVLIMID 10 MG capsule Take 10 mg by mouth daily.      . valACYclovir (VALTREX) 500 MG tablet Take 500 mg by mouth daily.      Marland Kitchen warfarin (COUMADIN) 5 MG tablet Take 5 mg by mouth daily.       No current facility-administered  medications on file prior to visit.    BP 118/80  Pulse 81  Temp(Src) 98.6 F (37 C) (Oral)  Wt 224 lb (101.606 kg)  BMI 31.26 kg/m2chart    Objective:   Physical Exam  Constitutional: He is oriented to person, place, and time. He appears well-developed and well-nourished.  HENT:  Right Ear: External ear normal.  Left Ear: External ear normal.  Nose: Nose normal.  Mouth/Throat: Oropharynx is clear and moist.  Neck: Normal range of motion. Neck supple.  Cardiovascular: Normal rate, regular rhythm and normal heart sounds.   Pulmonary/Chest: Effort normal and breath sounds normal.  Abdominal: Soft. Bowel sounds are normal.  Musculoskeletal: Normal range of motion.  Neurological: He is alert and oriented to person, place, and time.  Skin: Skin is warm and dry.  Psychiatric: He has a normal mood and affect.          Assessment & Plan:  Assessment: 1. Upper respiratory infection-improving 2. Metastatic prostate cancer  Plan: CBC sent. The patient and results. Continue over-the-counter symptomatic treatment for relief. Finally office with any questions or concerns. Recheck as scheduled, and as needed

## 2012-12-18 ENCOUNTER — Encounter: Payer: Self-pay | Admitting: Internal Medicine

## 2012-12-19 ENCOUNTER — Telehealth: Payer: Self-pay | Admitting: Internal Medicine

## 2012-12-19 NOTE — Telephone Encounter (Signed)
Advised pt's wife that Pad[place] and Dr. Lovell Sheehan at out of the office and pt cannot get meds without being seen. Wife advised that pt will try other meds over the weekend. I will forward message to [facility] for advise on Hydrocodone cough med. I will call pt back on Monday

## 2012-12-19 NOTE — Telephone Encounter (Signed)
Pt needs refill on hydrocodone cough syrup sent to [facility] on [address]. Pt last refill of medication was feb 2014. Pt saw NP on 12-15-12

## 2012-12-22 MED ORDER — HYDROCOD POLST-CHLORPHEN POLST 10-8 MG/5ML PO LQCR: 5.0000 mL | Freq: Two times a day (BID) | ORAL | Status: DC | PRN

## 2012-12-22 NOTE — Telephone Encounter (Signed)
See if patient needs med.

## 2012-12-22 NOTE — Telephone Encounter (Signed)
Pt does need med. He will come by the office to pick it up today

## 2012-12-24 ENCOUNTER — Other Ambulatory Visit (HOSPITAL_BASED_OUTPATIENT_CLINIC_OR_DEPARTMENT_OTHER): Payer: Medicare Other | Admitting: Lab

## 2012-12-24 ENCOUNTER — Ambulatory Visit (HOSPITAL_BASED_OUTPATIENT_CLINIC_OR_DEPARTMENT_OTHER): Payer: Medicare Other | Admitting: Hematology and Oncology

## 2012-12-24 ENCOUNTER — Other Ambulatory Visit: Payer: Self-pay | Admitting: *Deleted

## 2012-12-24 ENCOUNTER — Ambulatory Visit (HOSPITAL_BASED_OUTPATIENT_CLINIC_OR_DEPARTMENT_OTHER): Payer: Medicare Other

## 2012-12-24 ENCOUNTER — Encounter: Payer: Self-pay | Admitting: Hematology and Oncology

## 2012-12-24 VITALS — BP 133/84 | HR 65 | Temp 96.9°F | Resp 19 | Ht 71.0 in | Wt 225.8 lb

## 2012-12-24 DIAGNOSIS — C9000 Multiple myeloma not having achieved remission: Secondary | ICD-10-CM

## 2012-12-24 DIAGNOSIS — M549 Dorsalgia, unspecified: Secondary | ICD-10-CM

## 2012-12-24 DIAGNOSIS — Z86718 Personal history of other venous thrombosis and embolism: Secondary | ICD-10-CM

## 2012-12-24 DIAGNOSIS — G8929 Other chronic pain: Secondary | ICD-10-CM

## 2012-12-24 DIAGNOSIS — Z8546 Personal history of malignant neoplasm of prostate: Secondary | ICD-10-CM

## 2012-12-24 LAB — COMPREHENSIVE METABOLIC PANEL (CC13)
Albumin: 3.4 g/dL — ABNORMAL LOW (ref 3.5–5.0)
Anion Gap: 9 mEq/L (ref 3–11)
CO2: 19 mEq/L — ABNORMAL LOW (ref 22–29)
Creatinine: 1.1 mg/dL (ref 0.7–1.3)
Glucose: 86 mg/dl (ref 70–140)
Potassium: 4 mEq/L (ref 3.5–5.1)
Sodium: 137 mEq/L (ref 136–145)
Total Bilirubin: 0.43 mg/dL (ref 0.20–1.20)
Total Protein: 7.2 g/dL (ref 6.4–8.3)

## 2012-12-24 LAB — CBC WITH DIFFERENTIAL/PLATELET
Basophils Absolute: 0 10*3/uL (ref 0.0–0.1)
Eosinophils Absolute: 0.3 10*3/uL (ref 0.0–0.5)
HCT: 37.8 % — ABNORMAL LOW (ref 38.4–49.9)
HGB: 13 g/dL (ref 13.0–17.1)
LYMPH%: 20 % (ref 14.0–49.0)
MONO#: 1.2 10*3/uL — ABNORMAL HIGH (ref 0.1–0.9)
NEUT#: 3.8 10*3/uL (ref 1.5–6.5)
NEUT%: 56.8 % (ref 39.0–75.0)
Platelets: 186 10*3/uL (ref 140–400)
RBC: 3.98 10*6/uL — ABNORMAL LOW (ref 4.20–5.82)
WBC: 6.6 10*3/uL (ref 4.0–10.3)

## 2012-12-24 LAB — LACTATE DEHYDROGENASE (CC13): LDH: 129 U/L (ref 125–245)

## 2012-12-24 LAB — MAGNESIUM (CC13): Magnesium: 2.1 mg/dl (ref 1.5–2.5)

## 2012-12-24 MED ORDER — ZOLEDRONIC ACID 4 MG/100ML IV SOLN
4.0000 mg | Freq: Once | INTRAVENOUS | Status: AC
  Administered 2012-12-24: 4 mg via INTRAVENOUS
  Filled 2012-12-24: qty 100

## 2012-12-24 NOTE — Progress Notes (Signed)
[facility] OFFICE PROGRESS NOTE  Patient Care Team: Stacie Glaze, MD as PCP - General Raye Sorrow as Referring Physician (Internal Medicine) Artis Delay, MD as Consulting Physician (Hematology and Oncology)  DIAGNOSIS: ISK stage I, lambda light chain multiple myeloma, ongoing maintained since treatment  The patient was initially diagnosed in 2012. He had chemotherapy followed by autologous stem cell transplant [facility] he October 2013. In April 2014 he was started on maintain this Revlimid 10 mg daily by mouth, 21 days on, 7 days off along with monthly Zometa INTERVAL HISTORY: Rodney [name] 72 y.o. male returns for further followup. Today is the last day of his Revlimid treatment. The last time I saw him, he has significant back pain and refused pain medication. I recommend high-dose vitamin D supplements. He felt that his back pain is 50% better. He denies any signs or symptoms of osteonecrosis of the jaw. The patient continue on chronic anticoagulation therapy. The recent spontaneous bleeding such as epistaxis, hematuria, or hematochezia. Most recently he has significant upper respiratory tract infection and took Mucinex and is better He denies any recent fever, chills, night sweats or abnormal weight loss  I have reviewed the past medical history, past surgical history, social history and family history with the patient and they are unchanged from previous note.  ALLERGIES:  is allergic to blood-group specific substance.  MEDICATIONS:  Current Outpatient Prescriptions  Medication Sig Dispense Refill  . acetaminophen (TYLENOL) 325 MG tablet Take 650 mg by mouth every 6 (six) hours as needed for pain.      Marland Kitchen acetaminophen (TYLENOL) 325 MG tablet Take 650 mg by mouth. Take 650 mg by mouth every six (6) hours as needed.      . calcium carbonate (TUMS - DOSED IN MG ELEMENTAL CALCIUM) 500 MG chewable tablet Chew 1 tablet by mouth  every 2 (two) hours as needed. HEART BURN        . chlorpheniramine-HYDROcodone (TUSSIONEX PENNKINETIC ER) 10-8 MG/5ML LQCR Take 5 mLs by mouth every 12 (twelve) hours as needed.  140 mL  0  . cholecalciferol (VITAMIN D) 400 UNITS TABS tablet Take 400 Units by mouth 2 (two) times daily.      Marland Kitchen levothyroxine (SYNTHROID, LEVOTHROID) 100 MCG tablet Take 100 mcg by mouth daily.      . Loratadine (CLARITIN) 10 MG CAPS Take 10 mg by mouth daily.      . methylcellulose (ARTIFICIAL TEARS) 1 % ophthalmic solution Place 2 drops into both eyes 2 (two) times daily as needed. DRY EYES       . omeprazole (PRILOSEC) 20 MG capsule Take 20 mg by mouth 2 (two) times daily as needed. HEART BURN       . REVLIMID 10 MG capsule Take 10 mg by mouth daily.      . valACYclovir (VALTREX) 500 MG tablet Take 500 mg by mouth daily.      Marland Kitchen warfarin (COUMADIN) 5 MG tablet Take 5 mg by mouth daily.       No current facility-administered medications for this visit.    REVIEW OF SYSTEMS:   Constitutional: Denies fevers, chills or abnormal weight loss Eyes: Denies blurriness of vision Ears, nose, mouth, throat, and face: Denies mucositis or sore throat Respiratory: Denies cough, dyspnea or wheezes Cardiovascular: Denies palpitation, chest discomfort or lower extremity swelling Gastrointestinal:  Denies nausea, heartburn or change in bowel habits Skin: Denies abnormal skin rashes Lymphatics: Denies new lymphadenopathy or easy  bruising Neurological:Denies numbness, tingling or new weaknesses Behavioral/Psych: Mood is stable, no new changes  All other systems were reviewed with the patient and are negative.  PHYSICAL EXAMINATION: ECOG PERFORMANCE STATUS: 0 - Asymptomatic  Filed Vitals:   12/24/12 0830  BP: 133/84  Pulse: 65  Temp: 96.9 F (36.1 C)  Resp: 19   Filed Weights   12/24/12 0830  Weight: 225 lb 12.8 oz (102.422 kg)    GENERAL:alert, no distress and comfortable SKIN: skin color, texture, turgor are  normal, no rashes or significant lesions EYES: normal, Conjunctiva are pink and non-injected, sclera clear OROPHARYNX:no exudate, no erythema and lips, buccal mucosa, and tongue normal  NECK: supple, thyroid normal size, non-tender, without nodularity LYMPH:  no palpable lymphadenopathy in the cervical, axillary or inguinal LUNGS: clear to auscultation and percussion with normal breathing effort HEART: regular rate & rhythm and no murmurs and no lower extremity edema ABDOMEN:abdomen soft, non-tender and normal bowel sounds Musculoskeletal:no cyanosis of digits and no clubbing  NEURO: alert & oriented x 3 with fluent speech, no focal motor/sensory deficits  LABORATORY DATA:  I have reviewed the data as listed    Component Value Date/Time   NA 140 11/26/2012 1134   NA 136 04/06/2012 1950   K 3.8 11/26/2012 1134   K 3.9 04/06/2012 1950   CL 112* 08/27/2012 1309   CL 103 04/06/2012 1950   CO2 19* 11/26/2012 1134   CO2 22 04/06/2012 1950   GLUCOSE 94 11/26/2012 1134   GLUCOSE 100* 08/27/2012 1309   GLUCOSE 110* 04/06/2012 1950   BUN 14.7 11/26/2012 1134   BUN 19 04/06/2012 1950   CREATININE 1.1 11/26/2012 1134   CREATININE 1.25 04/06/2012 1950   CREATININE 0.91 01/01/2011 1630   CALCIUM 8.3* 11/26/2012 1134   CALCIUM 8.4 04/06/2012 1950   PROT 7.0 11/26/2012 1134   PROT 6.5 12/24/2011 1643   ALBUMIN 3.4* 11/26/2012 1134   ALBUMIN 4.0 12/24/2011 1643   AST 28 11/26/2012 1134   AST 21 12/24/2011 1643   ALT 35 11/26/2012 1134   ALT 39 12/24/2011 1643   ALKPHOS 112 11/26/2012 1134   ALKPHOS 119* 12/24/2011 1643   BILITOT 0.45 11/26/2012 1134   BILITOT 0.3 12/24/2011 1643   GFRNONAA 57* 04/06/2012 1950   GFRAA 66* 04/06/2012 1950    No results found for this basename: SPEP,  UPEP,   kappa and lambda light chains    Lab Results  Component Value Date   WBC 6.6 12/24/2012   NEUTROABS 3.8 12/24/2012   HGB 13.0 12/24/2012   HCT 37.8* 12/24/2012   MCV 95.1 12/24/2012   PLT 186 12/24/2012      Chemistry       Component Value Date/Time   NA 140 11/26/2012 1134   NA 136 04/06/2012 1950   K 3.8 11/26/2012 1134   K 3.9 04/06/2012 1950   CL 112* 08/27/2012 1309   CL 103 04/06/2012 1950   CO2 19* 11/26/2012 1134   CO2 22 04/06/2012 1950   BUN 14.7 11/26/2012 1134   BUN 19 04/06/2012 1950   CREATININE 1.1 11/26/2012 1134   CREATININE 1.25 04/06/2012 1950   CREATININE 0.91 01/01/2011 1630      Component Value Date/Time   CALCIUM 8.3* 11/26/2012 1134   CALCIUM 8.4 04/06/2012 1950   ALKPHOS 112 11/26/2012 1134   ALKPHOS 119* 12/24/2011 1643   AST 28 11/26/2012 1134   AST 21 12/24/2011 1643   ALT 35 11/26/2012 1134   ALT 39 12/24/2011  1643   BILITOT 0.45 11/26/2012 1134   BILITOT 0.3 12/24/2011 1643      ASSESSMENT:  Multiple myeloma, in remission  PLAN:  #1 multiple myeloma He'll continue maintenance Revlimid for about 2 years. He continue monthly Zometa for now. He tolerated treatment well with no side effects. He has regular dental visits and has no evidence of osteonecrosis of the jaw. I recommend consideration of changing his Zometa to once every 3 months. He has an appointment with his transplant doctor next week for further discussion.  #2 chronic back pain The patient has declined pain medicine. I recommended a trial of high-dose vitamin D with calcium supplement. This is improving. We'll continue the same. #3 preventive care You will need to continue antimicrobial prophylaxis with acyclovir. I recommend influenza vaccination. According to the patient had received some vaccinations recently at his transplant physician office. #4 history of prostate cancer According to the patient his last PSA was negative #5 history of clot You continue anticoagulation therapy indefinitely. He has no side effects of bleeding.  Orders Placed This Encounter  Procedures  . CBC with Differential    Standing Status: Future     Number of Occurrences:      Standing Expiration Date: 09/15/2013  . Comprehensive  metabolic panel    Standing Status: Future     Number of Occurrences:      Standing Expiration Date: 12/24/2013   All questions were answered. The patient knows to call the clinic with any problems, questions or concerns. No barriers to learning was detected. I spent 25 minutes counseling the patient face to face. The total time spent in the appointment was 40 minutes and more than 50% was on counseling and review of test results     [facility], Manpreet Kemmer, MD 12/24/2012 10:10 AM

## 2012-12-24 NOTE — Patient Instructions (Signed)
Zoledronic Acid injection (Hypercalcemia, Oncology) What is this medicine? ZOLEDRONIC ACID (ZOE le dron ik AS id) lowers the amount of calcium loss from bone. It is used to treat too much calcium in your blood from cancer. It is also used to prevent complications of cancer that has spread to the bone. This medicine may be used for other purposes; ask your health care provider or pharmacist if you have questions. What should I tell my health care provider before I take this medicine? They need to know if you have any of these conditions: -aspirin-sensitive asthma -dental disease -kidney disease -an unusual or allergic reaction to zoledronic acid, other medicines, foods, dyes, or preservatives -pregnant or trying to get pregnant -breast-feeding How should I use this medicine? This medicine is for infusion into a vein. It is given by a health care professional in a hospital or clinic setting. Talk to your pediatrician regarding the use of this medicine in children. Special care may be needed. Overdosage: If you think you have taken too much of this medicine contact a poison control center or emergency room at once. NOTE: This medicine is only for you. Do not share this medicine with others. What if I miss a dose? It is important not to miss your dose. Call your doctor or health care professional if you are unable to keep an appointment. What may interact with this medicine? -certain antibiotics given by injection -NSAIDs, medicines for pain and inflammation, like ibuprofen or naproxen -some diuretics like bumetanide, furosemide -teriparatide -thalidomide This list may not describe all possible interactions. Give your health care provider a list of all the medicines, herbs, non-prescription drugs, or dietary supplements you use. Also tell them if you smoke, drink alcohol, or use illegal drugs. Some items may interact with your medicine. What should I watch for while using this medicine? Visit  your doctor or health care professional for regular checkups. It may be some time before you see the benefit from this medicine. Do not stop taking your medicine unless your doctor tells you to. Your doctor may order blood tests or other tests to see how you are doing. Women should inform their doctor if they wish to become pregnant or think they might be pregnant. There is a potential for serious side effects to an unborn child. Talk to your health care professional or pharmacist for more information. You should make sure that you get enough calcium and vitamin D while you are taking this medicine. Discuss the foods you eat and the vitamins you take with your health care professional. Some people who take this medicine have severe bone, joint, and/or muscle pain. This medicine may also increase your risk for a broken thigh bone. Tell your doctor right away if you have pain in your upper leg or groin. Tell your doctor if you have any pain that does not go away or that gets worse. What side effects may I notice from receiving this medicine? Side effects that you should report to your doctor or health care professional as soon as possible: -allergic reactions like skin rash, itching or hives, swelling of the face, lips, or tongue -anxiety, confusion, or depression -breathing problems -changes in vision -feeling faint or lightheaded, falls -jaw burning, cramping, pain -muscle cramps, stiffness, or weakness -trouble passing urine or change in the amount of urine Side effects that usually do not require medical attention (report to your doctor or health care professional if they continue or are bothersome): -bone, joint, or muscle pain -  fever -hair loss -irritation at site where injected -loss of appetite -nausea, vomiting -stomach upset -tired This list may not describe all possible side effects. Call your doctor for medical advice about side effects. You may report side effects to FDA at  1-800-FDA-1088. Where should I keep my medicine? This drug is given in a hospital or clinic and will not be stored at home. NOTE: This sheet is a summary. It may not cover all possible information. If you have questions about this medicine, talk to your doctor, pharmacist, or health care provider.  2012, [employer]/Gold Standard. (08/18/2010 9:06:58 AM) 

## 2012-12-25 ENCOUNTER — Telehealth: Payer: Self-pay | Admitting: Hematology and Oncology

## 2012-12-25 NOTE — Telephone Encounter (Signed)
lvm for pt regarding to NOV appt.. °

## 2012-12-27 ENCOUNTER — Encounter: Payer: Self-pay | Admitting: Family

## 2013-01-08 ENCOUNTER — Other Ambulatory Visit: Payer: Self-pay

## 2013-01-09 ENCOUNTER — Other Ambulatory Visit: Payer: Medicare Other | Admitting: Lab

## 2013-01-12 ENCOUNTER — Ambulatory Visit (INDEPENDENT_AMBULATORY_CARE_PROVIDER_SITE_OTHER): Payer: Medicare Other | Admitting: Family

## 2013-01-12 DIAGNOSIS — Z86718 Personal history of other venous thrombosis and embolism: Secondary | ICD-10-CM

## 2013-01-12 DIAGNOSIS — Z7901 Long term (current) use of anticoagulants: Secondary | ICD-10-CM

## 2013-01-12 DIAGNOSIS — D689 Coagulation defect, unspecified: Secondary | ICD-10-CM

## 2013-01-12 LAB — POCT INR: INR: 2.3

## 2013-01-12 NOTE — Patient Instructions (Signed)
Continue to take 1 tablet daily except 1/2 tablet every Monday.  Re-check in 4 weeks.   Anticoagulation Dose Instructions as of 01/12/2013     [name] Tue Wed Thu Fri Sat   New Dose 5 mg 2.5 mg 5 mg 5 mg 5 mg 5 mg 5 mg    Description       Continue to take 1 tablet daily except 1/2 tablet every Monday.  Re-check in 4 weeks.

## 2013-01-15 ENCOUNTER — Other Ambulatory Visit: Payer: Self-pay | Admitting: General Practice

## 2013-01-15 ENCOUNTER — Telehealth: Payer: Self-pay | Admitting: Internal Medicine

## 2013-01-15 MED ORDER — WARFARIN SODIUM 5 MG PO TABS: ORAL_TABLET | ORAL | Status: DC

## 2013-01-15 NOTE — Telephone Encounter (Signed)
Pt needs refill on coumadin sent to [facility]

## 2013-01-21 ENCOUNTER — Other Ambulatory Visit (HOSPITAL_BASED_OUTPATIENT_CLINIC_OR_DEPARTMENT_OTHER): Payer: Medicare Other | Admitting: Lab

## 2013-01-21 ENCOUNTER — Ambulatory Visit (HOSPITAL_BASED_OUTPATIENT_CLINIC_OR_DEPARTMENT_OTHER): Payer: Medicare Other | Admitting: Hematology and Oncology

## 2013-01-21 ENCOUNTER — Ambulatory Visit: Payer: Medicare Other

## 2013-01-21 ENCOUNTER — Encounter: Payer: Self-pay | Admitting: Hematology and Oncology

## 2013-01-21 ENCOUNTER — Telehealth: Payer: Self-pay | Admitting: *Deleted

## 2013-01-21 ENCOUNTER — Ambulatory Visit (HOSPITAL_BASED_OUTPATIENT_CLINIC_OR_DEPARTMENT_OTHER): Payer: Medicare Other

## 2013-01-21 ENCOUNTER — Telehealth: Payer: Self-pay | Admitting: Hematology and Oncology

## 2013-01-21 VITALS — BP 143/81 | HR 77 | Temp 97.6°F | Resp 20 | Ht 71.0 in | Wt 226.2 lb

## 2013-01-21 DIAGNOSIS — C9000 Multiple myeloma not having achieved remission: Secondary | ICD-10-CM

## 2013-01-21 DIAGNOSIS — Z9484 Stem cells transplant status: Secondary | ICD-10-CM

## 2013-01-21 DIAGNOSIS — Z86718 Personal history of other venous thrombosis and embolism: Secondary | ICD-10-CM

## 2013-01-21 DIAGNOSIS — Z8546 Personal history of malignant neoplasm of prostate: Secondary | ICD-10-CM

## 2013-01-21 DIAGNOSIS — M549 Dorsalgia, unspecified: Secondary | ICD-10-CM

## 2013-01-21 LAB — CBC WITH DIFFERENTIAL/PLATELET
BASO%: 4.8 % — ABNORMAL HIGH (ref 0.0–2.0)
EOS%: 5.9 % (ref 0.0–7.0)
Eosinophils Absolute: 0.3 10*3/uL (ref 0.0–0.5)
HCT: 40.9 % (ref 38.4–49.9)
LYMPH%: 22.6 % (ref 14.0–49.0)
MCH: 32.9 pg (ref 27.2–33.4)
MCHC: 34.2 g/dL (ref 32.0–36.0)
MCV: 96.5 fL (ref 79.3–98.0)
MONO#: 1 10*3/uL — ABNORMAL HIGH (ref 0.1–0.9)
MONO%: 16.2 % — ABNORMAL HIGH (ref 0.0–14.0)
NEUT#: 3 10*3/uL (ref 1.5–6.5)
NEUT%: 50.5 % (ref 39.0–75.0)
Platelets: 190 10*3/uL (ref 140–400)
RBC: 4.24 10*6/uL (ref 4.20–5.82)
RDW: 16.5 % — ABNORMAL HIGH (ref 11.0–14.6)

## 2013-01-21 LAB — COMPREHENSIVE METABOLIC PANEL (CC13)
ALT: 51 U/L (ref 0–55)
Albumin: 3.7 g/dL (ref 3.5–5.0)
Alkaline Phosphatase: 109 U/L (ref 40–150)
Anion Gap: 12 mEq/L — ABNORMAL HIGH (ref 3–11)
CO2: 20 mEq/L — ABNORMAL LOW (ref 22–29)
Creatinine: 1.1 mg/dL (ref 0.7–1.3)
Potassium: 3.7 mEq/L (ref 3.5–5.1)
Total Bilirubin: 0.32 mg/dL (ref 0.20–1.20)
Total Protein: 7.8 g/dL (ref 6.4–8.3)

## 2013-01-21 MED ORDER — SODIUM CHLORIDE 0.9 % IV SOLN
Freq: Once | INTRAVENOUS | Status: AC
  Administered 2013-01-21: 13:00:00 via INTRAVENOUS

## 2013-01-21 MED ORDER — ZOLEDRONIC ACID 4 MG/100ML IV SOLN
4.0000 mg | Freq: Once | INTRAVENOUS | Status: AC
  Administered 2013-01-21: 4 mg via INTRAVENOUS
  Filled 2013-01-21: qty 100

## 2013-01-21 NOTE — Progress Notes (Signed)
[name] OFFICE PROGRESS NOTE  Patient Care Team: Stacie Glaze, MD as PCP - General Raye Sorrow as Referring Physician (Internal Medicine) Artis Delay, MD as Consulting Physician (Hematology and Oncology)  DIAGNOSIS: ISS stage I, lambda light chain multiple myeloma, ongoing maintenance treatment with Zometa and Revlimid  SUMMARY OF ONCOLOGIC HISTORY:  The patient was initially diagnosed in 2012. He had chemotherapy followed by autologous stem cell transplant [facility] he October 2013. In April 2014 he was started on maintain this Revlimid 10 mg daily by mouth, 21 days on, 7 days off along with monthly Zometa INTERVAL HISTORY: [name] 72 y.o. male returns for further followup. The patient went to [name] last week and received multiple dosage use of prophylactic vaccinations due to history transplant. He continue to take the high-dose vitamin D and it helps with his back pain. He denies any signs or symptoms of osteonecrosis of the jaw. The patient remain on warfarin for chronic anticoagulation therapy. The patient denies any recent signs or symptoms of bleeding such as spontaneous epistaxis, hematuria or hematochezia. He is currently on the last week of his Revlimid. He denies any recent fever, chills, night sweats or abnormal weight loss  I have reviewed the past medical history, past surgical history, social history and family history with the patient and they are unchanged from previous note.  ALLERGIES:  is allergic to blood-group specific substance.  MEDICATIONS:  Current Outpatient Prescriptions  Medication Sig Dispense Refill  . calcium carbonate (TUMS - DOSED IN MG ELEMENTAL CALCIUM) 500 MG chewable tablet Chew 1 tablet by mouth every 2 (two) hours as needed. HEART BURN        . cholecalciferol (VITAMIN D) 400 UNITS TABS tablet Take 400 Units by mouth 2 (two) times daily.      Marland Kitchen levothyroxine (SYNTHROID,  LEVOTHROID) 100 MCG tablet Take 100 mcg by mouth daily.      . Loratadine (CLARITIN) 10 MG CAPS Take 10 mg by mouth daily.      . methylcellulose (ARTIFICIAL TEARS) 1 % ophthalmic solution Place 2 drops into both eyes 2 (two) times daily as needed. DRY EYES       . omeprazole (PRILOSEC) 20 MG capsule Take 20 mg by mouth 2 (two) times daily as needed. HEART BURN       . REVLIMID 10 MG capsule Take 10 mg by mouth daily.      Marland Kitchen warfarin (COUMADIN) 5 MG tablet Take as directed by anticoagulation clinic  30 tablet  2  . acetaminophen (TYLENOL) 325 MG tablet Take 650 mg by mouth. Take 650 mg by mouth every six (6) hours as needed.       No current facility-administered medications for this visit.    REVIEW OF SYSTEMS:   Constitutional: Denies fevers, chills or abnormal weight loss Eyes: Denies blurriness of vision Ears, nose, mouth, throat, and face: Denies mucositis or sore throat Respiratory: Denies cough, dyspnea or wheezes Cardiovascular: Denies palpitation, chest discomfort or lower extremity swelling Gastrointestinal:  Denies nausea, heartburn or change in bowel habits Skin: Denies abnormal skin rashes Lymphatics: Denies new lymphadenopathy or easy bruising Neurological:Denies numbness, tingling or new weaknesses Behavioral/Psych: Mood is stable, no new changes  All other systems were reviewed with the patient and are negative.  PHYSICAL EXAMINATION: ECOG PERFORMANCE STATUS: 0 - Asymptomatic  Filed Vitals:   01/21/13 1204  BP: 143/81  Pulse: 77  Temp: 97.6 F (36.4 C)  Resp:  20   Filed Weights   01/21/13 1204  Weight: 226 lb 3.2 oz (102.604 kg)    GENERAL:alert, no distress and comfortable SKIN: skin color, texture, turgor are normal, no rashes or significant lesions EYES: normal, Conjunctiva are pink and non-injected, sclera clear OROPHARYNX:no exudate, no erythema and lips, buccal mucosa, and tongue normal  NECK: supple, thyroid normal size, non-tender, without  nodularity LYMPH:  no palpable lymphadenopathy in the cervical, axillary or inguinal LUNGS: clear to auscultation and percussion with normal breathing effort HEART: regular rate & rhythm and no murmurs and no lower extremity edema ABDOMEN:abdomen soft, non-tender and normal bowel sounds Musculoskeletal:no cyanosis of digits and no clubbing  NEURO: alert & oriented x 3 with fluent speech, no focal motor/sensory deficits  LABORATORY DATA:  I have reviewed the data as listed    Component Value Date/Time   NA 137 12/24/2012 0812   NA 136 04/06/2012 1950   K 4.0 12/24/2012 0812   K 3.9 04/06/2012 1950   CL 112* 08/27/2012 1309   CL 103 04/06/2012 1950   CO2 19* 12/24/2012 0812   CO2 22 04/06/2012 1950   GLUCOSE 86 12/24/2012 0812   GLUCOSE 100* 08/27/2012 1309   GLUCOSE 110* 04/06/2012 1950   BUN 21.1 12/24/2012 0812   BUN 19 04/06/2012 1950   CREATININE 1.1 12/24/2012 0812   CREATININE 1.25 04/06/2012 1950   CREATININE 0.91 01/01/2011 1630   CALCIUM 8.8 12/24/2012 0812   CALCIUM 8.4 04/06/2012 1950   PROT 7.2 12/24/2012 0812   PROT 6.5 12/24/2011 1643   ALBUMIN 3.4* 12/24/2012 0812   ALBUMIN 4.0 12/24/2011 1643   AST 24 12/24/2012 0812   AST 21 12/24/2011 1643   ALT 38 12/24/2012 0812   ALT 39 12/24/2011 1643   ALKPHOS 94 12/24/2012 0812   ALKPHOS 119* 12/24/2011 1643   BILITOT 0.43 12/24/2012 0812   BILITOT 0.3 12/24/2011 1643   GFRNONAA 57* 04/06/2012 1950   GFRAA 66* 04/06/2012 1950    No results found for this basename: SPEP,  UPEP,   kappa and lambda light chains    Lab Results  Component Value Date   WBC 6.0 01/21/2013   NEUTROABS 3.0 01/21/2013   HGB 14.0 01/21/2013   HCT 40.9 01/21/2013   MCV 96.5 01/21/2013   PLT 190 01/21/2013      Chemistry      Component Value Date/Time   NA 137 12/24/2012 0812   NA 136 04/06/2012 1950   K 4.0 12/24/2012 0812   K 3.9 04/06/2012 1950   CL 112* 08/27/2012 1309   CL 103 04/06/2012 1950   CO2 19* 12/24/2012 0812   CO2 22 04/06/2012 1950    BUN 21.1 12/24/2012 0812   BUN 19 04/06/2012 1950   CREATININE 1.1 12/24/2012 0812   CREATININE 1.25 04/06/2012 1950   CREATININE 0.91 01/01/2011 1630      Component Value Date/Time   CALCIUM 8.8 12/24/2012 0812   CALCIUM 8.4 04/06/2012 1950   ALKPHOS 94 12/24/2012 0812   ALKPHOS 119* 12/24/2011 1643   AST 24 12/24/2012 0812   AST 21 12/24/2011 1643   ALT 38 12/24/2012 0812   ALT 39 12/24/2011 1643   BILITOT 0.43 12/24/2012 0812   BILITOT 0.3 12/24/2011 1643      ASSESSMENT & PLAN:  #1 multiple myeloma He will continue on current treatment program with Revlimid 10 mg daily by mouth, 21 days on, 7 days off along with intravenous Zometa once a month. We will proceed with  Zometa today without delay. His blood work is satisfactory. He will continue on current treatment program for a minimum of total of 2 years. He will continue blood work and urine test to be performed at [facility] under the direction of his transplant physician #2 chronic back pain History very well with high dose vitamin D and calcium supplement. #3 preventive care His completed antimicrobial prophylaxis with acyclovir. He received repeat vaccinations recently with his transplant team. #4 history of prostate cancer Distal evidence of recurrence of disease. #5 history of blood clot The patient will continue on warfarin indefinitely. If no side effects such as bleeding.  Orders Placed This Encounter  Procedures  . CBC with Differential    Standing Status: Standing     Number of Occurrences: 9     Standing Expiration Date: 01/21/2014  . Basic metabolic panel    Standing Status: Standing     Number of Occurrences: 9     Standing Expiration Date: 01/21/2014   All questions were answered. The patient knows to call the clinic with any problems, questions or concerns. No barriers to learning was detected. I spent 25 minutes counseling the patient face to face. The total time spent in the appointment was 40  minutes and more than 50% was on counseling and review of test results     [name], Joanathan Affeldt, MD 01/21/2013 12:18 PM

## 2013-01-21 NOTE — Telephone Encounter (Signed)
Pt did not show for his appts this morning.  Left VM on cell phone requesting call to nurse.

## 2013-01-21 NOTE — Telephone Encounter (Signed)
gv and printed appt sched and avs for pt for DEc °

## 2013-01-21 NOTE — Patient Instructions (Signed)

## 2013-01-21 NOTE — Telephone Encounter (Signed)
Wife returned call and states pt said he was actually on way to his appts.   Pt did show up over 2 hrs late and Dr. Bertis Ruddy agreed to see him.

## 2013-02-06 ENCOUNTER — Other Ambulatory Visit: Payer: Medicare Other | Admitting: Lab

## 2013-02-09 ENCOUNTER — Ambulatory Visit (INDEPENDENT_AMBULATORY_CARE_PROVIDER_SITE_OTHER): Payer: Medicare Other | Admitting: General Practice

## 2013-02-09 DIAGNOSIS — Z86718 Personal history of other venous thrombosis and embolism: Secondary | ICD-10-CM

## 2013-02-09 DIAGNOSIS — Z7901 Long term (current) use of anticoagulants: Secondary | ICD-10-CM

## 2013-02-09 LAB — POCT INR: INR: 2.2

## 2013-02-09 NOTE — Progress Notes (Signed)
Pre-visit discussion using our clinic review tool. No additional management support is needed unless otherwise documented below in the visit note.  

## 2013-02-18 ENCOUNTER — Other Ambulatory Visit: Payer: Medicare Other | Admitting: Lab

## 2013-02-18 ENCOUNTER — Ambulatory Visit (HOSPITAL_BASED_OUTPATIENT_CLINIC_OR_DEPARTMENT_OTHER): Payer: Medicare Other

## 2013-02-18 ENCOUNTER — Ambulatory Visit (HOSPITAL_BASED_OUTPATIENT_CLINIC_OR_DEPARTMENT_OTHER): Payer: Medicare Other | Admitting: Hematology and Oncology

## 2013-02-18 ENCOUNTER — Other Ambulatory Visit (HOSPITAL_BASED_OUTPATIENT_CLINIC_OR_DEPARTMENT_OTHER): Payer: Medicare Other

## 2013-02-18 ENCOUNTER — Encounter: Payer: Self-pay | Admitting: Hematology and Oncology

## 2013-02-18 VITALS — BP 122/71 | HR 61 | Temp 98.0°F | Resp 18 | Ht 71.0 in | Wt 228.6 lb

## 2013-02-18 DIAGNOSIS — Z8546 Personal history of malignant neoplasm of prostate: Secondary | ICD-10-CM

## 2013-02-18 DIAGNOSIS — C9000 Multiple myeloma not having achieved remission: Secondary | ICD-10-CM

## 2013-02-18 DIAGNOSIS — M549 Dorsalgia, unspecified: Secondary | ICD-10-CM

## 2013-02-18 DIAGNOSIS — M898X9 Other specified disorders of bone, unspecified site: Secondary | ICD-10-CM | POA: Insufficient documentation

## 2013-02-18 DIAGNOSIS — G8929 Other chronic pain: Secondary | ICD-10-CM

## 2013-02-18 HISTORY — DX: Other specified disorders of bone, unspecified site: M89.8X9

## 2013-02-18 LAB — CBC WITH DIFFERENTIAL/PLATELET
BASO%: 3.5 % — ABNORMAL HIGH (ref 0.0–2.0)
Basophils Absolute: 0.2 10*3/uL — ABNORMAL HIGH (ref 0.0–0.1)
EOS%: 5.6 % (ref 0.0–7.0)
HCT: 39.8 % (ref 38.4–49.9)
HGB: 13.5 g/dL (ref 13.0–17.1)
LYMPH%: 25.5 % (ref 14.0–49.0)
MCH: 32.7 pg (ref 27.2–33.4)
MCHC: 34.1 g/dL (ref 32.0–36.0)
MCV: 95.9 fL (ref 79.3–98.0)
MONO#: 0.9 10*3/uL (ref 0.1–0.9)
MONO%: 16.2 % — ABNORMAL HIGH (ref 0.0–14.0)
Platelets: 155 10*3/uL (ref 140–400)
RBC: 4.14 10*6/uL — ABNORMAL LOW (ref 4.20–5.82)
WBC: 5.5 10*3/uL (ref 4.0–10.3)
lymph#: 1.4 10*3/uL (ref 0.9–3.3)

## 2013-02-18 LAB — BASIC METABOLIC PANEL (CC13)
Anion Gap: 10 mEq/L (ref 3–11)
BUN: 18.4 mg/dL (ref 7.0–26.0)
Calcium: 8.1 mg/dL — ABNORMAL LOW (ref 8.4–10.4)
Chloride: 112 mEq/L — ABNORMAL HIGH (ref 98–109)
Creatinine: 1.2 mg/dL (ref 0.7–1.3)
Sodium: 141 mEq/L (ref 136–145)

## 2013-02-18 MED ORDER — ZOLEDRONIC ACID 4 MG/100ML IV SOLN
4.0000 mg | Freq: Once | INTRAVENOUS | Status: AC
  Administered 2013-02-18: 4 mg via INTRAVENOUS
  Filled 2013-02-18: qty 100

## 2013-02-18 MED ORDER — SODIUM CHLORIDE 0.9 % IJ SOLN
10.0000 mL | INTRAMUSCULAR | Status: DC | PRN
  Filled 2013-02-18: qty 10

## 2013-02-18 MED ORDER — SODIUM CHLORIDE 0.9 % IV SOLN
Freq: Once | INTRAVENOUS | Status: AC
  Administered 2013-02-18: 10:00:00 via INTRAVENOUS

## 2013-02-18 NOTE — Progress Notes (Signed)
[facility] OFFICE PROGRESS NOTE  Patient Care Team: Stacie Glaze, MD as PCP - General Raye Sorrow as Referring Physician (Internal Medicine) Artis Delay, MD as Consulting Physician (Hematology and Oncology)  DIAGNOSIS: Multiple myeloma, in remission  SUMMARY OF ONCOLOGIC HISTORY:  The patient was initially diagnosed in 2012. He had chemotherapy followed by autologous stem cell transplant [facility] he October 2013. In April 2014 he was started on maintain this Revlimid 10 mg daily by mouth, 21 days on, 7 days off along with monthly Zometa  INTERVAL HISTORY: [name] 72 y.o. male returns for further followup. He complained of persistent back pain and bilateral knee pain as well as hip pain. Recently, he start taking high-dose vitamin D that seems to help. He denies any recent fever, chills, night sweats or abnormal weight loss He remained he remained on anticoagulation therapy. The patient denies any recent signs or symptoms of bleeding such as spontaneous epistaxis, hematuria or hematochezia.  I have reviewed the past medical history, past surgical history, social history and family history with the patient and they are unchanged from previous note.  ALLERGIES:  is allergic to blood-group specific substance.  MEDICATIONS:  Current Outpatient Prescriptions  Medication Sig Dispense Refill  . acetaminophen (TYLENOL) 325 MG tablet Take 650 mg by mouth. Take 650 mg by mouth every six (6) hours as needed.      . calcium carbonate (TUMS - DOSED IN MG ELEMENTAL CALCIUM) 500 MG chewable tablet Chew 1 tablet by mouth every 2 (two) hours as needed. HEART BURN        . Cholecalciferol (VITAMIN D) 1000 UNITS capsule Take 5,000 Units by mouth daily.      Marland Kitchen levothyroxine (SYNTHROID, LEVOTHROID) 100 MCG tablet Take 100 mcg by mouth daily.      . Loratadine (CLARITIN) 10 MG CAPS Take 10 mg by mouth daily.      . methylcellulose (ARTIFICIAL  TEARS) 1 % ophthalmic solution Place 2 drops into both eyes 2 (two) times daily as needed. DRY EYES       . omeprazole (PRILOSEC) 20 MG capsule Take 20 mg by mouth 2 (two) times daily as needed. HEART BURN       . REVLIMID 10 MG capsule Take 10 mg by mouth daily.      Marland Kitchen warfarin (COUMADIN) 5 MG tablet Take as directed by anticoagulation clinic  30 tablet  2   No current facility-administered medications for this visit.    REVIEW OF SYSTEMS:   Constitutional: Denies fevers, chills or abnormal weight loss Eyes: Denies blurriness of vision Ears, nose, mouth, throat, and face: Denies mucositis or sore throat Respiratory: Denies cough, dyspnea or wheezes Cardiovascular: Denies palpitation, chest discomfort or lower extremity swelling Gastrointestinal:  Denies nausea, heartburn or change in bowel habits Skin: Denies abnormal skin rashes Lymphatics: Denies new lymphadenopathy or easy bruising Neurological:Denies numbness, tingling or new weaknesses Behavioral/Psych: Mood is stable, no new changes  All other systems were reviewed with the patient and are negative.  PHYSICAL EXAMINATION: ECOG PERFORMANCE STATUS: 1 - Symptomatic but completely ambulatory  Filed Vitals:   02/18/13 0828  BP: 122/71  Pulse: 61  Temp: 98 F (36.7 C)  Resp: 18   Filed Weights   02/18/13 0828  Weight: 228 lb 9.6 oz (103.692 kg)    GENERAL:alert, no distress and comfortable SKIN: skin color, texture, turgor are normal, no rashes or significant lesions EYES: normal, Conjunctiva are pink and non-injected,  sclera clear OROPHARYNX:no exudate, no erythema and lips, buccal mucosa, and tongue normal  NECK: supple, thyroid normal size, non-tender, without nodularity LYMPH:  no palpable lymphadenopathy in the cervical, axillary or inguinal LUNGS: clear to auscultation and percussion with normal breathing effort HEART: regular rate & rhythm and no murmurs and no lower extremity edema ABDOMEN:abdomen soft,  non-tender and normal bowel sounds Musculoskeletal:no cyanosis of digits and no clubbing . No joint swelling over his knees NEURO: alert & oriented x 3 with fluent speech, no focal motor/sensory deficits  LABORATORY DATA:  I have reviewed the data as listed    Component Value Date/Time   NA 141 02/18/2013 0758   NA 136 04/06/2012 1950   K 4.0 02/18/2013 0758   K 3.9 04/06/2012 1950   CL 112* 08/27/2012 1309   CL 103 04/06/2012 1950   CO2 19* 02/18/2013 0758   CO2 22 04/06/2012 1950   GLUCOSE 106 02/18/2013 0758   GLUCOSE 100* 08/27/2012 1309   GLUCOSE 110* 04/06/2012 1950   BUN 18.4 02/18/2013 0758   BUN 19 04/06/2012 1950   CREATININE 1.2 02/18/2013 0758   CREATININE 1.25 04/06/2012 1950   CREATININE 0.91 01/01/2011 1630   CALCIUM 8.1* 02/18/2013 0758   CALCIUM 8.4 04/06/2012 1950   PROT 7.8 01/21/2013 1155   PROT 6.5 12/24/2011 1643   ALBUMIN 3.7 01/21/2013 1155   ALBUMIN 4.0 12/24/2011 1643   AST 31 01/21/2013 1155   AST 21 12/24/2011 1643   ALT 51 01/21/2013 1155   ALT 39 12/24/2011 1643   ALKPHOS 109 01/21/2013 1155   ALKPHOS 119* 12/24/2011 1643   BILITOT 0.32 01/21/2013 1155   BILITOT 0.3 12/24/2011 1643   GFRNONAA 57* 04/06/2012 1950   GFRAA 66* 04/06/2012 1950    No results found for this basename: SPEP,  UPEP,   kappa and lambda light chains    Lab Results  Component Value Date   WBC 5.5 02/18/2013   NEUTROABS 2.7 02/18/2013   HGB 13.5 02/18/2013   HCT 39.8 02/18/2013   MCV 95.9 02/18/2013   PLT 155 02/18/2013      Chemistry      Component Value Date/Time   NA 141 02/18/2013 0758   NA 136 04/06/2012 1950   K 4.0 02/18/2013 0758   K 3.9 04/06/2012 1950   CL 112* 08/27/2012 1309   CL 103 04/06/2012 1950   CO2 19* 02/18/2013 0758   CO2 22 04/06/2012 1950   BUN 18.4 02/18/2013 0758   BUN 19 04/06/2012 1950   CREATININE 1.2 02/18/2013 0758   CREATININE 1.25 04/06/2012 1950   CREATININE 0.91 01/01/2011 1630      Component Value Date/Time   CALCIUM 8.1* 02/18/2013 0758    CALCIUM 8.4 04/06/2012 1950   ALKPHOS 109 01/21/2013 1155   ALKPHOS 119* 12/24/2011 1643   AST 31 01/21/2013 1155   AST 21 12/24/2011 1643   ALT 51 01/21/2013 1155   ALT 39 12/24/2011 1643   BILITOT 0.32 01/21/2013 1155   BILITOT 0.3 12/24/2011 1643     ASSESSMENT & PLAN:  #1 multiple myeloma He will continue on current treatment program with Revlimid 10 mg daily by mouth, 21 days on, 7 days off along with intravenous Zometa once a month. We will proceed with Zometa today without delay. His blood work is satisfactory. He will continue on current treatment program for a minimum of total of 2 years under the direction of his transplant physician at [facility]. He will continue blood work  and urine test to be performed at [facility] under the direction of his transplant physician #2 chronic back pain History very well with high dose vitamin D and calcium supplement. He has an appointment to see the orthopedics at [facility] #3 preventive care His completed antimicrobial prophylaxis with acyclovir. He received repeat vaccinations recently with his transplant team. #4 history of prostate cancer No evidence of recurrence of disease. #5 history of blood clot The patient will continue on warfarin indefinitely. If no side effects such as bleeding. All questions were answered. The patient knows to call the clinic with any problems, questions or concerns. No barriers to learning was detected. I spent 25 minutes counseling the patient face to face. The total time spent in the appointment was 40 minutes and more than 50% was on counseling and review of test results     [facility], Darshan Solanki, MD 02/18/2013 9:12 AM

## 2013-02-18 NOTE — Patient Instructions (Signed)

## 2013-02-19 ENCOUNTER — Telehealth: Payer: Self-pay | Admitting: Hematology and Oncology

## 2013-02-19 NOTE — Telephone Encounter (Signed)
S/w pt re appt for 03/19/13

## 2013-03-19 ENCOUNTER — Ambulatory Visit (HOSPITAL_BASED_OUTPATIENT_CLINIC_OR_DEPARTMENT_OTHER): Payer: Medicare Other

## 2013-03-19 ENCOUNTER — Telehealth: Payer: Self-pay | Admitting: *Deleted

## 2013-03-19 ENCOUNTER — Other Ambulatory Visit (HOSPITAL_BASED_OUTPATIENT_CLINIC_OR_DEPARTMENT_OTHER): Payer: Medicare Other

## 2013-03-19 ENCOUNTER — Ambulatory Visit (HOSPITAL_BASED_OUTPATIENT_CLINIC_OR_DEPARTMENT_OTHER): Payer: Medicare Other | Admitting: Hematology and Oncology

## 2013-03-19 VITALS — BP 126/78 | HR 68 | Temp 97.1°F | Resp 18 | Ht 71.0 in | Wt 231.6 lb

## 2013-03-19 DIAGNOSIS — C9000 Multiple myeloma not having achieved remission: Secondary | ICD-10-CM

## 2013-03-19 DIAGNOSIS — D689 Coagulation defect, unspecified: Secondary | ICD-10-CM

## 2013-03-19 DIAGNOSIS — G8929 Other chronic pain: Secondary | ICD-10-CM

## 2013-03-19 DIAGNOSIS — R209 Unspecified disturbances of skin sensation: Secondary | ICD-10-CM

## 2013-03-19 DIAGNOSIS — M549 Dorsalgia, unspecified: Secondary | ICD-10-CM

## 2013-03-19 LAB — CBC WITH DIFFERENTIAL/PLATELET
BASO%: 3.3 % — AB (ref 0.0–2.0)
Basophils Absolute: 0.2 10*3/uL — ABNORMAL HIGH (ref 0.0–0.1)
EOS%: 6.6 % (ref 0.0–7.0)
Eosinophils Absolute: 0.3 10*3/uL (ref 0.0–0.5)
HEMATOCRIT: 40 % (ref 38.4–49.9)
HGB: 13.5 g/dL (ref 13.0–17.1)
LYMPH%: 27.3 % (ref 14.0–49.0)
MCH: 32.1 pg (ref 27.2–33.4)
MCHC: 33.7 g/dL (ref 32.0–36.0)
MCV: 95.1 fL (ref 79.3–98.0)
MONO#: 1 10*3/uL — AB (ref 0.1–0.9)
MONO%: 20 % — AB (ref 0.0–14.0)
NEUT#: 2.1 10*3/uL (ref 1.5–6.5)
NEUT%: 42.8 % (ref 39.0–75.0)
PLATELETS: 179 10*3/uL (ref 140–400)
RBC: 4.2 10*6/uL (ref 4.20–5.82)
RDW: 16.3 % — ABNORMAL HIGH (ref 11.0–14.6)
WBC: 4.9 10*3/uL (ref 4.0–10.3)
lymph#: 1.3 10*3/uL (ref 0.9–3.3)

## 2013-03-19 LAB — BASIC METABOLIC PANEL (CC13)
ANION GAP: 9 meq/L (ref 3–11)
BUN: 17.3 mg/dL (ref 7.0–26.0)
CALCIUM: 8.3 mg/dL — AB (ref 8.4–10.4)
CO2: 21 meq/L — AB (ref 22–29)
CREATININE: 1.2 mg/dL (ref 0.7–1.3)
Chloride: 110 mEq/L — ABNORMAL HIGH (ref 98–109)
Glucose: 90 mg/dl (ref 70–140)
Potassium: 4.1 mEq/L (ref 3.5–5.1)
Sodium: 141 mEq/L (ref 136–145)

## 2013-03-19 MED ORDER — ZOLEDRONIC ACID 4 MG/100ML IV SOLN
4.0000 mg | Freq: Once | INTRAVENOUS | Status: AC
  Administered 2013-03-19: 4 mg via INTRAVENOUS
  Filled 2013-03-19: qty 100

## 2013-03-19 MED ORDER — SODIUM CHLORIDE 0.9 % IV SOLN
Freq: Once | INTRAVENOUS | Status: AC
  Administered 2013-03-19: 15:00:00 via INTRAVENOUS

## 2013-03-19 NOTE — Progress Notes (Signed)
[place] OFFICE PROGRESS NOTE  Patient Care Team: Ricard Dillon, MD as PCP - General Terrilyn Saver as Referring Physician (Internal Medicine) Heath Lark, MD as Consulting Physician (Hematology and Oncology)  DIAGNOSIS: ISS stage I, lambda light chain multiple myeloma in remission  SUMMARY OF ONCOLOGIC HISTORY:  The patient was initially diagnosed in 2012. He had chemotherapy followed by autologous stem cell transplant [facility] he October 2013. In April 2014 he was started on maintain this Revlimid 10 mg daily by mouth along with monthly Zometa  INTERVAL HISTORY: Rodney [name] 73 y.o. male returns for further followup. His back pain anemia pain is about the same. Denies any signs and symptoms to suggest osteonecrosis of the jaw. He remained on anticoagulation therapy. The patient denies any recent signs or symptoms of bleeding such as spontaneous epistaxis, hematuria or hematochezia. The only mild complaint he has is tingling sensation at the tips of his fingers. It comes and goes.  I have reviewed the past medical history, past surgical history, social history and family history with the patient and they are unchanged from previous note.  ALLERGIES:  is allergic to blood-group specific substance.  MEDICATIONS:  Current Outpatient Prescriptions  Medication Sig Dispense Refill  . acetaminophen (TYLENOL) 325 MG tablet Take 650 mg by mouth. Take 650 mg by mouth every six (6) hours as needed.      . calcium carbonate (TUMS - DOSED IN MG ELEMENTAL CALCIUM) 500 MG chewable tablet Chew 1 tablet by mouth every 2 (two) hours as needed. HEART BURN        . Cholecalciferol (VITAMIN D) 1000 UNITS capsule Take 5,000 Units by mouth daily.      Marland Kitchen levothyroxine (SYNTHROID, LEVOTHROID) 100 MCG tablet Take 100 mcg by mouth daily.      . Loratadine (CLARITIN) 10 MG CAPS Take 10 mg by mouth daily.      . methylcellulose (ARTIFICIAL TEARS) 1 %  ophthalmic solution Place 2 drops into both eyes 2 (two) times daily as needed. DRY EYES       . omeprazole (PRILOSEC) 20 MG capsule Take 20 mg by mouth 2 (two) times daily as needed. HEART BURN       . REVLIMID 10 MG capsule Take 10 mg by mouth daily.      Marland Kitchen warfarin (COUMADIN) 5 MG tablet Take as directed by anticoagulation clinic  30 tablet  2   No current facility-administered medications for this visit.   Facility-Administered Medications Ordered in Other Visits  Medication Dose Route Frequency Provider Last Rate Last Dose  . 0.9 %  sodium chloride infusion   Intravenous Once Heath Lark, MD      . zolendronic acid (ZOMETA) 4 mg in sodium chloride 0.9 % 100 mL IVPB  4 mg Intravenous Once Heath Lark, MD        REVIEW OF SYSTEMS:   Constitutional: Denies fevers, chills or abnormal weight loss Eyes: Denies blurriness of vision Ears, nose, mouth, throat, and face: Denies mucositis or sore throat Respiratory: Denies cough, dyspnea or wheezes Cardiovascular: Denies palpitation, chest discomfort or lower extremity swelling Gastrointestinal:  Denies nausea, heartburn or change in bowel habits Skin: Denies abnormal skin rashes Lymphatics: Denies new lymphadenopathy or easy bruising Neurological:Denies numbness, tingling or new weaknesses Behavioral/Psych: Mood is stable, no new changes  All other systems were reviewed with the patient and are negative.  PHYSICAL EXAMINATION: ECOG PERFORMANCE STATUS: 0 - Asymptomatic  Filed Vitals:  03/19/13 1331  BP: 126/78  Pulse: 68  Temp: 97.1 F (36.2 C)  Resp: 18   Filed Weights   03/19/13 1331  Weight: 231 lb 9.6 oz (105.053 kg)    GENERAL:alert, no distress and comfortable SKIN: skin color, texture, turgor are normal, no rashes or significant lesions EYES: normal, Conjunctiva are pink and non-injected, sclera clear OROPHARYNX:no exudate, no erythema and lips, buccal mucosa, and tongue normal  NECK: supple, thyroid normal size,  non-tender, without nodularity LYMPH:  no palpable lymphadenopathy in the cervical, axillary or inguinal LUNGS: clear to auscultation and percussion with normal breathing effort HEART: regular rate & rhythm and no murmurs and no lower extremity edema ABDOMEN:abdomen soft, non-tender and normal bowel sounds Musculoskeletal:no cyanosis of digits and no clubbing  NEURO: alert & oriented x 3 with fluent speech, no focal motor/sensory deficits  LABORATORY DATA:  I have reviewed the data as listed    Component Value Date/Time   NA 141 03/19/2013 1320   NA 136 04/06/2012 1950   K 4.1 03/19/2013 1320   K 3.9 04/06/2012 1950   CL 112* 08/27/2012 1309   CL 103 04/06/2012 1950   CO2 21* 03/19/2013 1320   CO2 22 04/06/2012 1950   GLUCOSE 90 03/19/2013 1320   GLUCOSE 100* 08/27/2012 1309   GLUCOSE 110* 04/06/2012 1950   BUN 17.3 03/19/2013 1320   BUN 19 04/06/2012 1950   CREATININE 1.2 03/19/2013 1320   CREATININE 1.25 04/06/2012 1950   CREATININE 0.91 01/01/2011 1630   CALCIUM 8.3* 03/19/2013 1320   CALCIUM 8.4 04/06/2012 1950   PROT 7.8 01/21/2013 1155   PROT 6.5 12/24/2011 1643   ALBUMIN 3.7 01/21/2013 1155   ALBUMIN 4.0 12/24/2011 1643   AST 31 01/21/2013 1155   AST 21 12/24/2011 1643   ALT 51 01/21/2013 1155   ALT 39 12/24/2011 1643   ALKPHOS 109 01/21/2013 1155   ALKPHOS 119* 12/24/2011 1643   BILITOT 0.32 01/21/2013 1155   BILITOT 0.3 12/24/2011 1643   GFRNONAA 57* 04/06/2012 1950   GFRAA 66* 04/06/2012 1950    No results found for this basename: SPEP,  UPEP,   kappa and lambda light chains    Lab Results  Component Value Date   WBC 4.9 03/19/2013   NEUTROABS 2.1 03/19/2013   HGB 13.5 03/19/2013   HCT 40.0 03/19/2013   MCV 95.1 03/19/2013   PLT 179 03/19/2013      Chemistry      Component Value Date/Time   NA 141 03/19/2013 1320   NA 136 04/06/2012 1950   K 4.1 03/19/2013 1320   K 3.9 04/06/2012 1950   CL 112* 08/27/2012 1309   CL 103 04/06/2012 1950   CO2 21* 03/19/2013 1320   CO2 22 04/06/2012 1950    BUN 17.3 03/19/2013 1320   BUN 19 04/06/2012 1950   CREATININE 1.2 03/19/2013 1320   CREATININE 1.25 04/06/2012 1950   CREATININE 0.91 01/01/2011 1630      Component Value Date/Time   CALCIUM 8.3* 03/19/2013 1320   CALCIUM 8.4 04/06/2012 1950   ALKPHOS 109 01/21/2013 1155   ALKPHOS 119* 12/24/2011 1643   AST 31 01/21/2013 1155   AST 21 12/24/2011 1643   ALT 51 01/21/2013 1155   ALT 39 12/24/2011 1643   BILITOT 0.32 01/21/2013 1155   BILITOT 0.3 12/24/2011 1643      ASSESSMENT & PLAN:  #1 multiple myeloma He will continue on current treatment program with Revlimid 10 mg daily by mouth, 21  days on, 7 days off along with intravenous Zometa once a month. We will proceed with Zometa today without delay. His blood work is satisfactory. He will continue on current treatment program for a minimum of total of 2 years under the direction of his transplant physician at [facility]. He will continue blood work and urine test to be performed at [facility] under the direction of his transplant physician #2 chronic back pain History very well with high dose vitamin D and calcium supplement. He has an appointment to see the orthopedics at [facility] #3 preventive care His completed antimicrobial prophylaxis with acyclovir. He received repeat vaccinations recently with his transplant team. #4 history of prostate cancer No evidence of recurrence of disease. #5 history of blood clot The patient will continue on warfarin indefinitely. He has no side effects such as bleeding. #6 mild tingling sensation at the tips of fingers This could be exacerbation of mild peripheral neuropathy from prior treatment. I reassured the patient not to worry. All questions were answered. The patient knows to call the clinic with any problems, questions or concerns. No barriers to learning was detected. I spent 25 minutes counseling the patient face to face. The total time spent in the appointment was 40 minutes and more  than 50% was on counseling and review of test results     [facility], [place], MD 03/19/2013 2:39 PM

## 2013-03-19 NOTE — Telephone Encounter (Signed)
Per staff message and POF I have scheduled appts.  JMW  

## 2013-03-19 NOTE — Patient Instructions (Signed)

## 2013-03-21 ENCOUNTER — Telehealth: Payer: Self-pay | Admitting: Hematology and Oncology

## 2013-03-21 NOTE — Telephone Encounter (Signed)
Called pt and left message for lab,md and chemo for February 2015, also sent appt  through mail

## 2013-03-23 ENCOUNTER — Ambulatory Visit (INDEPENDENT_AMBULATORY_CARE_PROVIDER_SITE_OTHER): Payer: Medicare Other | Admitting: General Practice

## 2013-03-23 DIAGNOSIS — D689 Coagulation defect, unspecified: Secondary | ICD-10-CM

## 2013-03-23 LAB — POCT INR: INR: 2.1

## 2013-03-23 NOTE — Progress Notes (Signed)
Pre-visit discussion using our clinic review tool. No additional management support is needed unless otherwise documented below in the visit note.  

## 2013-04-17 ENCOUNTER — Other Ambulatory Visit: Payer: Self-pay | Admitting: Internal Medicine

## 2013-04-20 ENCOUNTER — Ambulatory Visit (HOSPITAL_BASED_OUTPATIENT_CLINIC_OR_DEPARTMENT_OTHER): Payer: Medicare Other

## 2013-04-20 ENCOUNTER — Other Ambulatory Visit (HOSPITAL_BASED_OUTPATIENT_CLINIC_OR_DEPARTMENT_OTHER): Payer: Medicare Other

## 2013-04-20 ENCOUNTER — Telehealth: Payer: Self-pay | Admitting: *Deleted

## 2013-04-20 ENCOUNTER — Ambulatory Visit (HOSPITAL_BASED_OUTPATIENT_CLINIC_OR_DEPARTMENT_OTHER): Payer: Medicare Other | Admitting: Hematology and Oncology

## 2013-04-20 ENCOUNTER — Other Ambulatory Visit: Payer: Self-pay | Admitting: General Practice

## 2013-04-20 ENCOUNTER — Encounter: Payer: Self-pay | Admitting: Specialist

## 2013-04-20 ENCOUNTER — Encounter: Payer: Self-pay | Admitting: Hematology and Oncology

## 2013-04-20 DIAGNOSIS — C61 Malignant neoplasm of prostate: Secondary | ICD-10-CM

## 2013-04-20 DIAGNOSIS — Z8546 Personal history of malignant neoplasm of prostate: Secondary | ICD-10-CM

## 2013-04-20 DIAGNOSIS — M549 Dorsalgia, unspecified: Secondary | ICD-10-CM

## 2013-04-20 DIAGNOSIS — C9000 Multiple myeloma not having achieved remission: Secondary | ICD-10-CM

## 2013-04-20 DIAGNOSIS — G8929 Other chronic pain: Secondary | ICD-10-CM

## 2013-04-20 DIAGNOSIS — R209 Unspecified disturbances of skin sensation: Secondary | ICD-10-CM

## 2013-04-20 LAB — BASIC METABOLIC PANEL (CC13)
Anion Gap: 7 mEq/L (ref 3–11)
BUN: 19.2 mg/dL (ref 7.0–26.0)
CALCIUM: 9 mg/dL (ref 8.4–10.4)
CO2: 22 mEq/L (ref 22–29)
Chloride: 110 mEq/L — ABNORMAL HIGH (ref 98–109)
Creatinine: 1.2 mg/dL (ref 0.7–1.3)
Glucose: 75 mg/dl (ref 70–140)
Potassium: 4 mEq/L (ref 3.5–5.1)
SODIUM: 138 meq/L (ref 136–145)

## 2013-04-20 LAB — CBC WITH DIFFERENTIAL/PLATELET
BASO%: 2.4 % — ABNORMAL HIGH (ref 0.0–2.0)
Basophils Absolute: 0.1 10*3/uL (ref 0.0–0.1)
EOS ABS: 0.3 10*3/uL (ref 0.0–0.5)
EOS%: 4.7 % (ref 0.0–7.0)
HCT: 40.2 % (ref 38.4–49.9)
HEMOGLOBIN: 13.6 g/dL (ref 13.0–17.1)
LYMPH%: 31 % (ref 14.0–49.0)
MCH: 31.9 pg (ref 27.2–33.4)
MCHC: 33.8 g/dL (ref 32.0–36.0)
MCV: 94.4 fL (ref 79.3–98.0)
MONO#: 1.1 10*3/uL — AB (ref 0.1–0.9)
MONO%: 18.9 % — ABNORMAL HIGH (ref 0.0–14.0)
NEUT#: 2.6 10*3/uL (ref 1.5–6.5)
NEUT%: 43 % (ref 39.0–75.0)
Platelets: 180 10*3/uL (ref 140–400)
RBC: 4.26 10*6/uL (ref 4.20–5.82)
RDW: 15.5 % — ABNORMAL HIGH (ref 11.0–14.6)
WBC: 6.1 10*3/uL (ref 4.0–10.3)
lymph#: 1.9 10*3/uL (ref 0.9–3.3)

## 2013-04-20 MED ORDER — SODIUM CHLORIDE 0.9 % IV SOLN
INTRAVENOUS | Status: DC
  Administered 2013-04-20: 09:00:00 via INTRAVENOUS

## 2013-04-20 MED ORDER — ZOLEDRONIC ACID 4 MG/100ML IV SOLN
4.0000 mg | Freq: Once | INTRAVENOUS | Status: AC
  Administered 2013-04-20: 4 mg via INTRAVENOUS
  Filled 2013-04-20: qty 100

## 2013-04-20 MED ORDER — WARFARIN SODIUM 5 MG PO TABS: ORAL_TABLET | ORAL | Status: DC

## 2013-04-20 NOTE — Patient Instructions (Signed)

## 2013-04-20 NOTE — Progress Notes (Signed)
Met patient in infusion room. 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 He said, "I am not afraid to die." He is doing things to prepare his family in the event that he does die from the cancer. Facilitated his recognition and application of his faith to his life. Encouraged self-care. Explored hope, coping, and resilience resources. His experience in the [place] has not been as positive as his experiences at [facility]. Listened; encouraged his connection to the resources here at [facility].  Epifania Gore, [facility], PhD

## 2013-04-20 NOTE — Telephone Encounter (Signed)
Per patient and POF I have scheduled appt

## 2013-04-20 NOTE — Progress Notes (Signed)
[place] OFFICE PROGRESS NOTE  Patient Care Team: Ricard Dillon, MD as PCP - General Terrilyn Saver as Referring Physician (Internal Medicine) Heath Lark, MD as Consulting Physician (Hematology and Oncology)  DIAGNOSIS: Multiple myeloma, ongoing treatment with Revlimid and Zometa  SUMMARY OF ONCOLOGIC HISTORY:   Multiple myeloma   The patient was initially diagnosed in 2012. He had chemotherapy followed by autologous stem cell transplant [place] he October 2013. In April 2014 he was started on maintain this Revlimid 10 mg daily by mouth along with monthly Zometa  INTERVAL HISTORY: Rodney [name] 73 y.o. male returns for further followup. He denies any problems with his dentition. He denies any recent fever, chills, night sweats or abnormal weight loss History of chronic back pain but no evidence of new bone pain. The patient denies any recent signs or symptoms of bleeding such as spontaneous epistaxis, hematuria or hematochezia. He complained of mild tingling and numbness at the tip of the fingers on his left hand but not worse I have reviewed the past medical history, past surgical history, social history and family history with the patient and they are unchanged from previous note.  ALLERGIES:  is allergic to blood-group specific substance.  MEDICATIONS:  Current Outpatient Prescriptions  Medication Sig Dispense Refill  . acetaminophen (TYLENOL) 325 MG tablet Take 650 mg by mouth. Take 650 mg by mouth every six (6) hours as needed.      . calcium carbonate (TUMS - DOSED IN MG ELEMENTAL CALCIUM) 500 MG chewable tablet Chew 1 tablet by mouth every 2 (two) hours as needed. HEART BURN        . Cholecalciferol (VITAMIN D) 1000 UNITS capsule Take 5,000 Units by mouth daily.      Marland Kitchen levothyroxine (SYNTHROID, LEVOTHROID) 100 MCG tablet Take 100 mcg by mouth daily.      . Loratadine (CLARITIN) 10 MG CAPS Take 10 mg by mouth daily.       . methylcellulose (ARTIFICIAL TEARS) 1 % ophthalmic solution Place 2 drops into both eyes 2 (two) times daily as needed. DRY EYES       . omeprazole (PRILOSEC) 20 MG capsule Take 20 mg by mouth 2 (two) times daily as needed. HEART BURN       . REVLIMID 10 MG capsule Take 10 mg by mouth daily.      Marland Kitchen warfarin (COUMADIN) 5 MG tablet Take as directed by anticoagulation clinic  35 tablet  3   No current facility-administered medications for this visit.    REVIEW OF SYSTEMS:   Constitutional: Denies fevers, chills or abnormal weight loss Eyes: Denies blurriness of vision Ears, nose, mouth, throat, and face: Denies mucositis or sore throat Respiratory: Denies cough, dyspnea or wheezes Cardiovascular: Denies palpitation, chest discomfort or lower extremity swelling Gastrointestinal:  Denies nausea, heartburn or change in bowel habits Skin: Denies abnormal skin rashes Lymphatics: Denies new lymphadenopathy or easy bruising Neurological:Denies numbness, tingling or new weaknesses Behavioral/Psych: Mood is stable, no new changes  All other systems were reviewed with the patient and are negative.  PHYSICAL EXAMINATION: ECOG PERFORMANCE STATUS: 1 - Symptomatic but completely ambulatory  There were no vitals filed for this visit. There were no vitals filed for this visit.  GENERAL:alert, no distress and comfortable SKIN: skin color, texture, turgor are normal, no rashes or significant lesions EYES: normal, Conjunctiva are pink and non-injected, sclera clear OROPHARYNX:no exudate, no erythema and lips, buccal mucosa, and tongue normal . Poor  dentition is noted NECK: supple, thyroid normal size, non-tender, without nodularity LYMPH:  no palpable lymphadenopathy in the cervical, axillary or inguinal LUNGS: clear to auscultation and percussion with normal breathing effort HEART: regular rate & rhythm and no murmurs and no lower extremity edema ABDOMEN:abdomen soft, non-tender and normal bowel  sounds Musculoskeletal:no cyanosis of digits and no clubbing  NEURO: alert & oriented x 3 with fluent speech, no focal motor/sensory deficits  LABORATORY DATA:  I have reviewed the data as listed    Component Value Date/Time   NA 141 03/19/2013 1320   NA 136 04/06/2012 1950   K 4.1 03/19/2013 1320   K 3.9 04/06/2012 1950   CL 112* 08/27/2012 1309   CL 103 04/06/2012 1950   CO2 21* 03/19/2013 1320   CO2 22 04/06/2012 1950   GLUCOSE 90 03/19/2013 1320   GLUCOSE 100* 08/27/2012 1309   GLUCOSE 110* 04/06/2012 1950   BUN 17.3 03/19/2013 1320   BUN 19 04/06/2012 1950   CREATININE 1.2 03/19/2013 1320   CREATININE 1.25 04/06/2012 1950   CREATININE 0.91 01/01/2011 1630   CALCIUM 8.3* 03/19/2013 1320   CALCIUM 8.4 04/06/2012 1950   PROT 7.8 01/21/2013 1155   PROT 6.5 12/24/2011 1643   ALBUMIN 3.7 01/21/2013 1155   ALBUMIN 4.0 12/24/2011 1643   AST 31 01/21/2013 1155   AST 21 12/24/2011 1643   ALT 51 01/21/2013 1155   ALT 39 12/24/2011 1643   ALKPHOS 109 01/21/2013 1155   ALKPHOS 119* 12/24/2011 1643   BILITOT 0.32 01/21/2013 1155   BILITOT 0.3 12/24/2011 1643   GFRNONAA 57* 04/06/2012 1950   GFRAA 66* 04/06/2012 1950    No results found for this basename: SPEP,  UPEP,   kappa and lambda light chains    Lab Results  Component Value Date   WBC 6.1 04/20/2013   NEUTROABS 2.6 04/20/2013   HGB 13.6 04/20/2013   HCT 40.2 04/20/2013   MCV 94.4 04/20/2013   PLT 180 04/20/2013      Chemistry      Component Value Date/Time   NA 141 03/19/2013 1320   NA 136 04/06/2012 1950   K 4.1 03/19/2013 1320   K 3.9 04/06/2012 1950   CL 112* 08/27/2012 1309   CL 103 04/06/2012 1950   CO2 21* 03/19/2013 1320   CO2 22 04/06/2012 1950   BUN 17.3 03/19/2013 1320   BUN 19 04/06/2012 1950   CREATININE 1.2 03/19/2013 1320   CREATININE 1.25 04/06/2012 1950   CREATININE 0.91 01/01/2011 1630      Component Value Date/Time   CALCIUM 8.3* 03/19/2013 1320   CALCIUM 8.4 04/06/2012 1950   ALKPHOS 109 01/21/2013 1155   ALKPHOS 119* 12/24/2011  1643   AST 31 01/21/2013 1155   AST 21 12/24/2011 1643   ALT 51 01/21/2013 1155   ALT 39 12/24/2011 1643   BILITOT 0.32 01/21/2013 1155   BILITOT 0.3 12/24/2011 1643     ASSESSMENT & PLAN:  #1 multiple myeloma He will continue on current treatment program with Revlimid 10 mg daily by mouth, 21 days on, 7 days off along with intravenous Zometa once a month. We will proceed with Zometa today without delay. His blood work is satisfactory. He will continue on current treatment program for a minimum of total of 2 years under the direction of his transplant physician at [facility]. He will continue blood work and urine test to be performed at [facility] under the direction of his transplant physician #  2 chronic back pain History very well with high dose vitamin D and calcium supplement. He has an appointment to see the orthopedics at [facility] and was told no surgical management as needed. Physical therapy was recommended but the patient declined due to lack of time to attend physical therapy sessions. #3 preventive care He had completed antimicrobial prophylaxis with acyclovir. He received repeat vaccinations recently with his transplant team. #4 history of prostate cancer No evidence of recurrence of disease. Apparently nobody has been checking his PSA. I will order a PSA with his next visit #5 history of blood clot The patient will continue on warfarin indefinitely. He has no side effects such as bleeding. His primary care provider is managing his INR monitoring #6 mild tingling sensation at the tips of fingers This could be exacerbation of mild peripheral neuropathy from prior treatment. I reassured the patient not to worry. Orders Placed This Encounter  Procedures  . PSA    Standing Status: Future     Number of Occurrences:      Standing Expiration Date: 04/20/2014   All questions were answered. The patient knows to call the clinic with any problems, questions or concerns. No  barriers to learning was detected. I spent 25 minutes counseling the patient face to face. The total time spent in the appointment was 40 minutes and more than 50% was on counseling and review of test results     [facility], [place], MD 04/20/2013 8:47 AM

## 2013-04-27 ENCOUNTER — Ambulatory Visit: Payer: Medicare Other

## 2013-05-11 ENCOUNTER — Ambulatory Visit (INDEPENDENT_AMBULATORY_CARE_PROVIDER_SITE_OTHER): Payer: Medicare Other | Admitting: General Practice

## 2013-05-11 DIAGNOSIS — Z5181 Encounter for therapeutic drug level monitoring: Secondary | ICD-10-CM

## 2013-05-11 DIAGNOSIS — D689 Coagulation defect, unspecified: Secondary | ICD-10-CM

## 2013-05-11 DIAGNOSIS — Z86718 Personal history of other venous thrombosis and embolism: Secondary | ICD-10-CM

## 2013-05-11 LAB — POCT INR: INR: 2.2

## 2013-05-11 NOTE — Progress Notes (Signed)
Pre visit review using our clinic review tool, if applicable. No additional management support is needed unless otherwise documented below in the visit note. 

## 2013-05-18 ENCOUNTER — Ambulatory Visit (HOSPITAL_BASED_OUTPATIENT_CLINIC_OR_DEPARTMENT_OTHER): Payer: Medicare Other | Admitting: Hematology and Oncology

## 2013-05-18 ENCOUNTER — Ambulatory Visit (HOSPITAL_BASED_OUTPATIENT_CLINIC_OR_DEPARTMENT_OTHER): Payer: Medicare Other

## 2013-05-18 ENCOUNTER — Other Ambulatory Visit (HOSPITAL_BASED_OUTPATIENT_CLINIC_OR_DEPARTMENT_OTHER): Payer: Medicare Other

## 2013-05-18 ENCOUNTER — Telehealth: Payer: Self-pay | Admitting: Hematology and Oncology

## 2013-05-18 ENCOUNTER — Encounter: Payer: Self-pay | Admitting: Hematology and Oncology

## 2013-05-18 VITALS — BP 114/72 | HR 70 | Temp 99.5°F | Resp 21 | Ht 71.0 in

## 2013-05-18 DIAGNOSIS — C9000 Multiple myeloma not having achieved remission: Secondary | ICD-10-CM

## 2013-05-18 DIAGNOSIS — C61 Malignant neoplasm of prostate: Secondary | ICD-10-CM

## 2013-05-18 DIAGNOSIS — Z8546 Personal history of malignant neoplasm of prostate: Secondary | ICD-10-CM

## 2013-05-18 DIAGNOSIS — Z9484 Stem cells transplant status: Secondary | ICD-10-CM

## 2013-05-18 DIAGNOSIS — G8929 Other chronic pain: Secondary | ICD-10-CM

## 2013-05-18 DIAGNOSIS — M549 Dorsalgia, unspecified: Secondary | ICD-10-CM

## 2013-05-18 LAB — BASIC METABOLIC PANEL (CC13)
ANION GAP: 8 meq/L (ref 3–11)
BUN: 16.1 mg/dL (ref 7.0–26.0)
CO2: 23 mEq/L (ref 22–29)
CREATININE: 1.3 mg/dL (ref 0.7–1.3)
Calcium: 9.4 mg/dL (ref 8.4–10.4)
Chloride: 110 mEq/L — ABNORMAL HIGH (ref 98–109)
Glucose: 109 mg/dl (ref 70–140)
Potassium: 3.8 mEq/L (ref 3.5–5.1)
Sodium: 141 mEq/L (ref 136–145)

## 2013-05-18 LAB — CBC WITH DIFFERENTIAL/PLATELET
BASO%: 3.5 % — ABNORMAL HIGH (ref 0.0–2.0)
BASOS ABS: 0.2 10*3/uL — AB (ref 0.0–0.1)
EOS%: 3.7 % (ref 0.0–7.0)
Eosinophils Absolute: 0.2 10*3/uL (ref 0.0–0.5)
HEMATOCRIT: 40.5 % (ref 38.4–49.9)
HEMOGLOBIN: 13.8 g/dL (ref 13.0–17.1)
LYMPH%: 34.2 % (ref 14.0–49.0)
MCH: 31.5 pg (ref 27.2–33.4)
MCHC: 33.9 g/dL (ref 32.0–36.0)
MCV: 92.9 fL (ref 79.3–98.0)
MONO#: 0.8 10*3/uL (ref 0.1–0.9)
MONO%: 15.9 % — AB (ref 0.0–14.0)
NEUT#: 2.1 10*3/uL (ref 1.5–6.5)
NEUT%: 42.7 % (ref 39.0–75.0)
Platelets: 162 10*3/uL (ref 140–400)
RBC: 4.36 10*6/uL (ref 4.20–5.82)
RDW: 15.9 % — ABNORMAL HIGH (ref 11.0–14.6)
WBC: 5 10*3/uL (ref 4.0–10.3)
lymph#: 1.7 10*3/uL (ref 0.9–3.3)

## 2013-05-18 LAB — PSA: PSA: <0.01 ng/mL (ref <=4.00)

## 2013-05-18 MED ORDER — ZOLEDRONIC ACID 4 MG/100ML IV SOLN
4.0000 mg | Freq: Once | INTRAVENOUS | Status: AC
  Administered 2013-05-18: 4 mg via INTRAVENOUS
  Filled 2013-05-18: qty 100

## 2013-05-18 NOTE — Telephone Encounter (Signed)
Gave appt for lab and MD for April and MAY emailed michelee regarding Zometa for June 2015

## 2013-05-18 NOTE — Progress Notes (Signed)
[place] OFFICE PROGRESS NOTE  Patient Care Team: Ricard Dillon, MD as PCP - General Terrilyn Saver as Referring Physician (Internal Medicine) Heath Lark, MD as Consulting Physician (Hematology and Oncology)  DIAGNOSIS: Multiple myeloma, on maintenance therapy  SUMMARY OF ONCOLOGIC HISTORY:   Multiple myeloma   The patient was initially diagnosed in 2012. He had chemotherapy followed by autologous stem cell transplant [place] around October 2013. In April 2014 he was started on maintenance Revlimid 10 mg daily by mouth along with monthly Zometa. Starting on 05/18/2013, his Zometa is change to every 3 months. INTERVAL HISTORY: Rodney [name] 73 y.o. male returns for further followup. He has history of chronic back pain but this is not new. He denies any bleeding from anticoagulation therapy.  I have reviewed the past medical history, past surgical history, social history and family history with the patient and they are unchanged from previous note.  ALLERGIES:  is allergic to blood-group specific substance.  MEDICATIONS:  Current Outpatient Prescriptions  Medication Sig Dispense Refill  . acetaminophen (TYLENOL) 325 MG tablet Take 650 mg by mouth. Take 650 mg by mouth every six (6) hours as needed.      . calcium carbonate (TUMS - DOSED IN MG ELEMENTAL CALCIUM) 500 MG chewable tablet Chew 1 tablet by mouth every 2 (two) hours as needed. HEART BURN        . Cholecalciferol (VITAMIN D) 1000 UNITS capsule Take 5,000 Units by mouth daily.      Marland Kitchen levothyroxine (SYNTHROID, LEVOTHROID) 100 MCG tablet Take 100 mcg by mouth daily.      . Loratadine (CLARITIN) 10 MG CAPS Take 10 mg by mouth daily.      . methylcellulose (ARTIFICIAL TEARS) 1 % ophthalmic solution Place 2 drops into both eyes 2 (two) times daily as needed. DRY EYES       . omeprazole (PRILOSEC) 20 MG capsule Take 20 mg by mouth 2 (two) times daily as needed. HEART BURN    . REVLIMID 10 MG capsule Take 10 mg by mouth daily.      Marland Kitchen warfarin (COUMADIN) 5 MG tablet Take as directed by anticoagulation clinic  35 tablet  3   No current facility-administered medications for this visit.    REVIEW OF SYSTEMS:   Constitutional: Denies fevers, chills or abnormal weight loss Eyes: Denies blurriness of vision Ears, nose, mouth, throat, and face: Denies mucositis or sore throat Respiratory: Denies cough, dyspnea or wheezes Cardiovascular: Denies palpitation, chest discomfort or lower extremity swelling Gastrointestinal:  Denies nausea, heartburn or change in bowel habits Skin: Denies abnormal skin rashes Lymphatics: Denies new lymphadenopathy or easy bruising Neurological:Denies numbness, tingling or new weaknesses Behavioral/Psych: Mood is stable, no new changes  All other systems were reviewed with the patient and are negative.  PHYSICAL EXAMINATION: ECOG PERFORMANCE STATUS: 1 - Symptomatic but completely ambulatory  Filed Vitals:   05/18/13 0908  BP: 114/72  Pulse: 70  Temp: 99.5 F (37.5 C)  Resp: 21   There were no vitals filed for this visit.  GENERAL:alert, no distress and comfortable SKIN: skin color, texture, turgor are normal, no rashes or significant lesions EYES: normal, Conjunctiva are pink and non-injected, sclera clear OROPHARYNX:no exudate, no erythema and lips, buccal mucosa, and tongue normal  NECK: supple, thyroid normal size, non-tender, without nodularity LYMPH:  no palpable lymphadenopathy in the cervical, axillary or inguinal LUNGS: clear to auscultation and percussion with normal breathing effort HEART: regular rate &  rhythm and no murmurs and no lower extremity edema ABDOMEN:abdomen soft, non-tender and normal bowel sounds Musculoskeletal:no cyanosis of digits and no clubbing  NEURO: alert & oriented x 3 with fluent speech, no focal motor/sensory deficits  LABORATORY DATA:  I have reviewed the data as listed    Component  Value Date/Time   NA 138 04/20/2013 0812   NA 136 04/06/2012 1950   K 4.0 04/20/2013 0812   K 3.9 04/06/2012 1950   CL 112* 08/27/2012 1309   CL 103 04/06/2012 1950   CO2 22 04/20/2013 0812   CO2 22 04/06/2012 1950   GLUCOSE 75 04/20/2013 0812   GLUCOSE 100* 08/27/2012 1309   GLUCOSE 110* 04/06/2012 1950   BUN 19.2 04/20/2013 0812   BUN 19 04/06/2012 1950   CREATININE 1.2 04/20/2013 0812   CREATININE 1.25 04/06/2012 1950   CREATININE 0.91 01/01/2011 1630   CALCIUM 9.0 04/20/2013 0812   CALCIUM 8.4 04/06/2012 1950   PROT 7.8 01/21/2013 1155   PROT 6.5 12/24/2011 1643   ALBUMIN 3.7 01/21/2013 1155   ALBUMIN 4.0 12/24/2011 1643   AST 31 01/21/2013 1155   AST 21 12/24/2011 1643   ALT 51 01/21/2013 1155   ALT 39 12/24/2011 1643   ALKPHOS 109 01/21/2013 1155   ALKPHOS 119* 12/24/2011 1643   BILITOT 0.32 01/21/2013 1155   BILITOT 0.3 12/24/2011 1643   GFRNONAA 57* 04/06/2012 1950   GFRAA 66* 04/06/2012 1950    No results found for this basename: SPEP, UPEP,  kappa and lambda light chains    Lab Results  Component Value Date   WBC 5.0 05/18/2013   NEUTROABS 2.1 05/18/2013   HGB 13.8 05/18/2013   HCT 40.5 05/18/2013   MCV 92.9 05/18/2013   PLT 162 05/18/2013      Chemistry      Component Value Date/Time   NA 138 04/20/2013 0812   NA 136 04/06/2012 1950   K 4.0 04/20/2013 0812   K 3.9 04/06/2012 1950   CL 112* 08/27/2012 1309   CL 103 04/06/2012 1950   CO2 22 04/20/2013 0812   CO2 22 04/06/2012 1950   BUN 19.2 04/20/2013 0812   BUN 19 04/06/2012 1950   CREATININE 1.2 04/20/2013 0812   CREATININE 1.25 04/06/2012 1950   CREATININE 0.91 01/01/2011 1630      Component Value Date/Time   CALCIUM 9.0 04/20/2013 0812   CALCIUM 8.4 04/06/2012 1950   ALKPHOS 109 01/21/2013 1155   ALKPHOS 119* 12/24/2011 1643   AST 31 01/21/2013 1155   AST 21 12/24/2011 1643   ALT 51 01/21/2013 1155   ALT 39 12/24/2011 1643   BILITOT 0.32 01/21/2013 1155   BILITOT 0.3 12/24/2011 1643     ASSESSMENT & PLAN:  #1 multiple myeloma He  will continue on current treatment program with Revlimid 10 mg daily by mouth, 21 days on, 7 days off along with intravenous Zometa. Since he had been receiving Zometa monthly, after today's dose, I will change it to every 3 months. His blood work is satisfactory. He will continue on current treatment program for a minimum of total of 2 years under the direction of his transplant physician at [facility]. He will continue blood work and urine test to be performed at [facility] under the direction of his transplant physician #2 chronic back pain History very well with high dose vitamin D and calcium supplement.  #3 history of prostate cancer No evidence of recurrence of disease. Apparently nobody has been  checking his PSA. PSA from today's blood work is pending. #4 history of blood clot The patient will continue on warfarin indefinitely. He has no side effects such as bleeding. His primary care provider is managing his INR monitoring  Orders Placed This Encounter  Procedures  . CBC with Differential    Standing Status: Standing     Number of Occurrences: 9     Standing Expiration Date: 05/19/2014  . Comprehensive metabolic panel    Standing Status: Standing     Number of Occurrences: 9     Standing Expiration Date: 05/19/2014   All questions were answered. The patient knows to call the clinic with any problems, questions or concerns. No barriers to learning was detected. I spent 15 minutes counseling the patient face to face. The total time spent in the appointment was 20 minutes and more than 50% was on counseling and review of test results     [facility], Shyra Emile, MD 05/18/2013 9:18 AM

## 2013-05-19 ENCOUNTER — Telehealth: Payer: Self-pay | Admitting: Hematology and Oncology

## 2013-05-19 ENCOUNTER — Telehealth: Payer: Self-pay | Admitting: *Deleted

## 2013-05-19 NOTE — Telephone Encounter (Signed)
Called pt and left messager regarding all appt for April and june 2015

## 2013-05-19 NOTE — Telephone Encounter (Signed)
Per staff message and POF I have scheduled appts.  JMW  

## 2013-06-18 ENCOUNTER — Ambulatory Visit (HOSPITAL_BASED_OUTPATIENT_CLINIC_OR_DEPARTMENT_OTHER): Payer: Medicare Other | Admitting: Hematology and Oncology

## 2013-06-18 ENCOUNTER — Encounter: Payer: Self-pay | Admitting: Hematology and Oncology

## 2013-06-18 ENCOUNTER — Other Ambulatory Visit: Payer: Medicare Other

## 2013-06-18 ENCOUNTER — Telehealth: Payer: Self-pay | Admitting: Hematology and Oncology

## 2013-06-18 VITALS — BP 127/69 | HR 61 | Temp 97.0°F | Resp 18 | Ht 71.0 in | Wt 230.6 lb

## 2013-06-18 DIAGNOSIS — Z7901 Long term (current) use of anticoagulants: Secondary | ICD-10-CM

## 2013-06-18 DIAGNOSIS — D689 Coagulation defect, unspecified: Secondary | ICD-10-CM

## 2013-06-18 DIAGNOSIS — C9000 Multiple myeloma not having achieved remission: Secondary | ICD-10-CM

## 2013-06-18 DIAGNOSIS — G8929 Other chronic pain: Secondary | ICD-10-CM

## 2013-06-18 DIAGNOSIS — M549 Dorsalgia, unspecified: Secondary | ICD-10-CM

## 2013-06-18 DIAGNOSIS — Z8546 Personal history of malignant neoplasm of prostate: Secondary | ICD-10-CM

## 2013-06-18 DIAGNOSIS — C9001 Multiple myeloma in remission: Secondary | ICD-10-CM

## 2013-06-18 LAB — CBC WITH DIFFERENTIAL/PLATELET
BASO%: 2.8 % — ABNORMAL HIGH (ref 0.0–2.0)
Basophils Absolute: 0.2 10*3/uL — ABNORMAL HIGH (ref 0.0–0.1)
EOS%: 4.5 % (ref 0.0–7.0)
Eosinophils Absolute: 0.2 10*3/uL (ref 0.0–0.5)
HEMATOCRIT: 39 % (ref 38.4–49.9)
HGB: 13.1 g/dL (ref 13.0–17.1)
LYMPH#: 1.6 10*3/uL (ref 0.9–3.3)
LYMPH%: 30.3 % (ref 14.0–49.0)
MCH: 30.8 pg (ref 27.2–33.4)
MCHC: 33.6 g/dL (ref 32.0–36.0)
MCV: 91.5 fL (ref 79.3–98.0)
MONO#: 1 10*3/uL — ABNORMAL HIGH (ref 0.1–0.9)
MONO%: 18.5 % — AB (ref 0.0–14.0)
NEUT#: 2.4 10*3/uL (ref 1.5–6.5)
NEUT%: 43.9 % (ref 39.0–75.0)
Platelets: 176 10*3/uL (ref 140–400)
RBC: 4.26 10*6/uL (ref 4.20–5.82)
RDW: 15.8 % — AB (ref 11.0–14.6)
WBC: 5.4 10*3/uL (ref 4.0–10.3)

## 2013-06-18 LAB — COMPREHENSIVE METABOLIC PANEL (CC13)
ALT: 31 U/L (ref 0–55)
AST: 27 U/L (ref 5–34)
Albumin: 3.7 g/dL (ref 3.5–5.0)
Alkaline Phosphatase: 75 U/L (ref 40–150)
Anion Gap: 9 mEq/L (ref 3–11)
BUN: 19.6 mg/dL (ref 7.0–26.0)
CALCIUM: 8.4 mg/dL (ref 8.4–10.4)
CHLORIDE: 111 meq/L — AB (ref 98–109)
CO2: 19 mEq/L — ABNORMAL LOW (ref 22–29)
Creatinine: 1.2 mg/dL (ref 0.7–1.3)
Glucose: 77 mg/dl (ref 70–140)
Potassium: 4.2 mEq/L (ref 3.5–5.1)
Sodium: 139 mEq/L (ref 136–145)
Total Bilirubin: 0.52 mg/dL (ref 0.20–1.20)
Total Protein: 7.1 g/dL (ref 6.4–8.3)

## 2013-06-18 NOTE — Telephone Encounter (Signed)
gv adn printedj appt sched and avs for opt for June

## 2013-06-18 NOTE — Progress Notes (Signed)
[place] OFFICE PROGRESS NOTE  Patient Care Team: Ricard Dillon, MD as PCP - General Terrilyn Saver, MD as Referring Physician (Internal Medicine) Heath Lark, MD as Consulting Physician (Hematology and Oncology)  DIAGNOSIS: Multiple myeloma on maintenance therapy  SUMMARY OF ONCOLOGIC HISTORY:   Multiple myeloma   The patient was initially diagnosed in 2012. He had chemotherapy followed by autologous stem cell transplant [place] around October 2013. In April 2014 he was started on maintenance Revlimid 10 mg daily by mouth along with monthly Zometa. Starting on 05/18/2013, his Zometa is change to every 3 months.  INTERVAL HISTORY: Rodney [name] 73 y.o. male returns for further followup. He denies any new bone pain. He has chronic back pain. His energy level is fair without any change in appetite or weight. No recent infection. The patient denies any recent signs or symptoms of bleeding such as spontaneous epistaxis, hematuria or hematochezia.  I have reviewed the past medical history, past surgical history, social history and family history with the patient and they are unchanged from previous note.  ALLERGIES:  is allergic to blood-group specific substance.  MEDICATIONS:  Current Outpatient Prescriptions  Medication Sig Dispense Refill  . acetaminophen (TYLENOL) 325 MG tablet Take 650 mg by mouth. Take 650 mg by mouth every six (6) hours as needed.      . calcium carbonate (TUMS - DOSED IN MG ELEMENTAL CALCIUM) 500 MG chewable tablet Chew 1 tablet by mouth every 2 (two) hours as needed. HEART BURN        . Cholecalciferol (VITAMIN D) 1000 UNITS capsule Take 5,000 Units by mouth daily.      Marland Kitchen levothyroxine (SYNTHROID, LEVOTHROID) 100 MCG tablet Take 100 mcg by mouth daily.      . Loratadine (CLARITIN) 10 MG CAPS Take 10 mg by mouth daily.      . methylcellulose (ARTIFICIAL TEARS) 1 % ophthalmic solution Place 2 drops into  both eyes 2 (two) times daily as needed. DRY EYES       . omeprazole (PRILOSEC) 20 MG capsule Take 20 mg by mouth 2 (two) times daily as needed. HEART BURN       . REVLIMID 10 MG capsule Take 10 mg by mouth daily.      Marland Kitchen warfarin (COUMADIN) 5 MG tablet Take as directed by anticoagulation clinic  35 tablet  3   No current facility-administered medications for this visit.    REVIEW OF SYSTEMS:   Constitutional: Denies fevers, chills or abnormal weight loss Eyes: Denies blurriness of vision Ears, nose, mouth, throat, and face: Denies mucositis or sore throat Respiratory: Denies cough, dyspnea or wheezes Cardiovascular: Denies palpitation, chest discomfort or lower extremity swelling Gastrointestinal:  Denies nausea, heartburn or change in bowel habits Skin: Denies abnormal skin rashes Lymphatics: Denies new lymphadenopathy or easy bruising Neurological:Denies numbness, tingling or new weaknesses Behavioral/Psych: Mood is stable, no new changes  All other systems were reviewed with the patient and are negative.  PHYSICAL EXAMINATION: ECOG PERFORMANCE STATUS: 0 - Asymptomatic  Filed Vitals:   06/18/13 0815  BP: 127/69  Pulse: 61  Temp: 97 F (36.1 C)  Resp: 18   Filed Weights   06/18/13 0815  Weight: 230 lb 9.6 oz (104.599 kg)    GENERAL:alert, no distress and comfortable SKIN: skin color, texture, turgor are normal, no rashes or significant lesions EYES: normal, Conjunctiva are pink and non-injected, sclera clear OROPHARYNX:no exudate, no erythema and lips, buccal mucosa, and  tongue normal  NECK: supple, thyroid normal size, non-tender, without nodularity LYMPH:  no palpable lymphadenopathy in the cervical, axillary or inguinal LUNGS: clear to auscultation and percussion with normal breathing effort HEART: regular rate & rhythm and no murmurs and no lower extremity edema ABDOMEN:abdomen soft, non-tender and normal bowel sounds Musculoskeletal:no cyanosis of digits and no  clubbing  NEURO: alert & oriented x 3 with fluent speech, no focal motor/sensory deficits  LABORATORY DATA:  I have reviewed the data as listed    Component Value Date/Time   NA 139 06/18/2013 0803   NA 136 04/06/2012 1950   K 4.2 06/18/2013 0803   K 3.9 04/06/2012 1950   CL 112* 08/27/2012 1309   CL 103 04/06/2012 1950   CO2 19* 06/18/2013 0803   CO2 22 04/06/2012 1950   GLUCOSE 77 06/18/2013 0803   GLUCOSE 100* 08/27/2012 1309   GLUCOSE 110* 04/06/2012 1950   BUN 19.6 06/18/2013 0803   BUN 19 04/06/2012 1950   CREATININE 1.2 06/18/2013 0803   CREATININE 1.25 04/06/2012 1950   CREATININE 0.91 01/01/2011 1630   CALCIUM 8.4 06/18/2013 0803   CALCIUM 8.4 04/06/2012 1950   PROT 7.1 06/18/2013 0803   PROT 6.5 12/24/2011 1643   ALBUMIN 3.7 06/18/2013 0803   ALBUMIN 4.0 12/24/2011 1643   AST 27 06/18/2013 0803   AST 21 12/24/2011 1643   ALT 31 06/18/2013 0803   ALT 39 12/24/2011 1643   ALKPHOS 75 06/18/2013 0803   ALKPHOS 119* 12/24/2011 1643   BILITOT 0.52 06/18/2013 0803   BILITOT 0.3 12/24/2011 1643   GFRNONAA 57* 04/06/2012 1950   GFRAA 66* 04/06/2012 1950    No results found for this basename: SPEP,  UPEP,   kappa and lambda light chains    Lab Results  Component Value Date   WBC 5.4 06/18/2013   NEUTROABS 2.4 06/18/2013   HGB 13.1 06/18/2013   HCT 39.0 06/18/2013   MCV 91.5 06/18/2013   PLT 176 06/18/2013      Chemistry      Component Value Date/Time   NA 139 06/18/2013 0803   NA 136 04/06/2012 1950   K 4.2 06/18/2013 0803   K 3.9 04/06/2012 1950   CL 112* 08/27/2012 1309   CL 103 04/06/2012 1950   CO2 19* 06/18/2013 0803   CO2 22 04/06/2012 1950   BUN 19.6 06/18/2013 0803   BUN 19 04/06/2012 1950   CREATININE 1.2 06/18/2013 0803   CREATININE 1.25 04/06/2012 1950   CREATININE 0.91 01/01/2011 1630      Component Value Date/Time   CALCIUM 8.4 06/18/2013 0803   CALCIUM 8.4 04/06/2012 1950   ALKPHOS 75 06/18/2013 0803   ALKPHOS 119* 12/24/2011 1643   AST 27 06/18/2013 0803   AST 21 12/24/2011 1643   ALT 31  06/18/2013 0803   ALT 39 12/24/2011 1643   BILITOT 0.52 06/18/2013 0803   BILITOT 0.3 12/24/2011 1643     ASSESSMENT & PLAN:  #1 multiple myeloma He will continue on current treatment program with Revlimid 10 mg daily by mouth, 21 days on, 7 days off along with intravenous Zometa. He is currently on Zometa every 3 months. His blood work is satisfactory. He will continue on current treatment program for a minimum of total of 2 years under the direction of his transplant physician at [facility]. He will continue blood work and urine test to be performed at [facility] under the direction of his transplant physician #2 chronic back  pain History very well with high dose vitamin D and calcium supplement.  #3 history of prostate cancer No evidence of recurrence of disease. Apparently nobody has been checking his PSA. PSA from 05/18/2013 was undetectable. #4 history of blood clot The patient will continue on warfarin indefinitely. He has no side effects such as bleeding. His primary care provider is managing his INR monitoring   All questions were answered. The patient knows to call the clinic with any problems, questions or concerns. No barriers to learning was detected. I spent 15 minutes counseling the patient face to face. The total time spent in the appointment was 20 minutes and more than 50% was on counseling and review of test results     Heath Lark, MD 06/18/2013 10:02 AM

## 2013-06-22 ENCOUNTER — Ambulatory Visit: Payer: Medicare Other

## 2013-06-23 ENCOUNTER — Telehealth: Payer: Self-pay | Admitting: Internal Medicine

## 2013-06-23 NOTE — Telephone Encounter (Signed)
[id number] - , [place] - [address] Is requesting re-fill on levothyroxine (SYNTHROID, LEVOTHROID) 100 MCG tablet ° °

## 2013-06-24 ENCOUNTER — Telehealth: Payer: Self-pay | Admitting: Internal Medicine

## 2013-06-24 ENCOUNTER — Other Ambulatory Visit: Payer: Self-pay | Admitting: General Practice

## 2013-06-24 MED ORDER — LEVOTHYROXINE SODIUM 100 MCG PO TABS: 100.0000 ug | ORAL_TABLET | Freq: Every day | ORAL | Status: DC

## 2013-06-24 MED ORDER — WARFARIN SODIUM 5 MG PO TABS: ORAL_TABLET | ORAL | Status: DC

## 2013-06-24 NOTE — Telephone Encounter (Signed)
30 day supply sent in to [facility]

## 2013-06-24 NOTE — Telephone Encounter (Signed)
Pt needs refill of warfarin (COUMADIN) 5 MG tablet [facility]

## 2013-06-24 NOTE — Telephone Encounter (Signed)
Pt has made fup on meds w/ dr Maudie Mercury.  Pt would like to know if you would call in his levothyroxine (SYNTHROID, LEVOTHROID) 100 MCG tablet To [facility]

## 2013-06-24 NOTE — Telephone Encounter (Signed)
Called and [facility] for pt to schedule a med check OV.  Per Derinda Late pt can schedule with Dr. Maudie Mercury or Abby Potash.

## 2013-06-24 NOTE — Telephone Encounter (Signed)
Pt needs an appt

## 2013-07-02 ENCOUNTER — Ambulatory Visit (INDEPENDENT_AMBULATORY_CARE_PROVIDER_SITE_OTHER): Payer: Medicare Other | Admitting: General Practice

## 2013-07-02 ENCOUNTER — Encounter: Payer: Self-pay | Admitting: Family Medicine

## 2013-07-02 ENCOUNTER — Ambulatory Visit (INDEPENDENT_AMBULATORY_CARE_PROVIDER_SITE_OTHER): Payer: Medicare Other | Admitting: Family Medicine

## 2013-07-02 VITALS — BP 120/76 | HR 63 | Temp 98.4°F | Ht 71.0 in | Wt 233.0 lb

## 2013-07-02 DIAGNOSIS — M8448XA Pathological fracture, other site, initial encounter for fracture: Secondary | ICD-10-CM

## 2013-07-02 DIAGNOSIS — M545 Low back pain, unspecified: Secondary | ICD-10-CM

## 2013-07-02 DIAGNOSIS — Z8546 Personal history of malignant neoplasm of prostate: Secondary | ICD-10-CM

## 2013-07-02 DIAGNOSIS — Z86718 Personal history of other venous thrombosis and embolism: Secondary | ICD-10-CM

## 2013-07-02 DIAGNOSIS — M899 Disorder of bone, unspecified: Secondary | ICD-10-CM

## 2013-07-02 DIAGNOSIS — M898X9 Other specified disorders of bone, unspecified site: Secondary | ICD-10-CM

## 2013-07-02 DIAGNOSIS — M949 Disorder of cartilage, unspecified: Secondary | ICD-10-CM

## 2013-07-02 DIAGNOSIS — Z5181 Encounter for therapeutic drug level monitoring: Secondary | ICD-10-CM

## 2013-07-02 DIAGNOSIS — E039 Hypothyroidism, unspecified: Secondary | ICD-10-CM

## 2013-07-02 DIAGNOSIS — D689 Coagulation defect, unspecified: Secondary | ICD-10-CM

## 2013-07-02 DIAGNOSIS — M533 Sacrococcygeal disorders, not elsewhere classified: Secondary | ICD-10-CM

## 2013-07-02 LAB — TSH: TSH: 2.49 u[IU]/mL (ref 0.35–5.50)

## 2013-07-02 LAB — POCT INR: INR: 2.9

## 2013-07-02 MED ORDER — WARFARIN SODIUM 5 MG PO TABS: ORAL_TABLET | ORAL | Status: DC

## 2013-07-02 NOTE — Progress Notes (Signed)
No chief complaint on file.   HPI:  Acute visit for:  Hypothyroid: -needs refill on thyroid medication -looks like needs TSH check  MM/Hx Prstate cancer, chronic back pain: -followed by multiple specialist  Hx PE: -on chronic coumadin -needs coumadin rx refill -followed by coumadin clinic   ROS: See pertinent positives and negatives per HPI.  Past Medical History  Diagnosis Date  . Pancreatitis   . Pulmonary embolism   . Complication of anesthesia 02-16-11    Pt. speaks of awakening during the surgery from back  surgery  . Shortness of breath 02-16-11    hx. Pulmonary emboli x2 (9 yrs/ 3'12 -last)  . Anemia 02-16-11    01-05-11- post surgery-Transfusions x 4 units  . Prostate cancer   . Multiple myeloma 02-16-11    suspected tumor  left sacral  . Degenerative disc disease 02-16-11    01-05-11 L3 fusion done  . Hernia 02-16-11    left inguinal hernia at present  . Bone pain 02/18/2013    Past Surgical History  Procedure Laterality Date  . Back surgery  02-16-11     01-05-11 Lumbar surgery L3(complicated by loss of blood volume)/ 01-09-11 then Lumbar fusion done with retained  hardware   . Cholecystectomy  04/06/2010    laparoscopic-inflammation with stones  . Inguinal hernia repair  02/20/2011    Procedure: HERNIA REPAIR INGUINAL ADULT;  Surgeon: Earnstine Regal, MD;  Location: [facility];  Service: General;  Laterality: Left;  Repair Left Inguinal Hernia with Mesh  . Hernia repair  02/20/11    Inguinal hernia repair w/mesh  . Prostate surgery Bilateral 2011    Family History  Problem Relation Age of Onset  . Lymphoma Father 45  . Cancer Father   . Clotting disorder Mother     blood clots    History   Social History  . Marital Status: Married    Spouse Name: N/A    Number of Children: N/A  . Years of Education: N/A   Social History Main Topics  . Smoking status: Former Smoker    Quit date: 02/15/1969  . Smokeless tobacco: Never Used     Comment: quit 50  yrs  . Alcohol Use: No  . Drug Use: No  . Sexual Activity: Yes   Other Topics Concern  . None   Social History Narrative   Married   Regular exercise-yes    Current outpatient prescriptions:acetaminophen (TYLENOL) 325 MG tablet, Take 650 mg by mouth. Take 650 mg by mouth every six (6) hours as needed., Disp: , Rfl: ;  calcium carbonate (TUMS - DOSED IN MG ELEMENTAL CALCIUM) 500 MG chewable tablet, Chew 1 tablet by mouth every 2 (two) hours as needed. HEART BURN  , Disp: , Rfl: ;  Cholecalciferol (VITAMIN D) 1000 UNITS capsule, Take 5,000 Units by mouth daily., Disp: , Rfl:  levothyroxine (SYNTHROID, LEVOTHROID) 100 MCG tablet, Take 1 tablet (100 mcg total) by mouth daily., Disp: 30 tablet, Rfl: 0;  Loratadine (CLARITIN) 10 MG CAPS, Take 10 mg by mouth daily., Disp: , Rfl: ;  methylcellulose (ARTIFICIAL TEARS) 1 % ophthalmic solution, Place 2 drops into both eyes 2 (two) times daily as needed. DRY EYES , Disp: , Rfl:  omeprazole (PRILOSEC) 20 MG capsule, Take 20 mg by mouth 2 (two) times daily as needed. HEART BURN , Disp: , Rfl: ;  REVLIMID 10 MG capsule, Take 10 mg by mouth daily., Disp: , Rfl: ;  warfarin (COUMADIN) 5 MG tablet, Take  as directed by anticoagulation clinic, Disp: 35 tablet, Rfl: 11  EXAM:  Filed Vitals:   07/02/13 0804  BP: 120/76  Pulse: 63  Temp: 98.4 F (36.9 C)    Body mass index is 32.51 kg/(m^2).  GENERAL: vitals reviewed and listed above, alert, oriented, appears well hydrated and in no acute distress  HEENT: atraumatic, conjunttiva clear, no obvious abnormalities on inspection of external nose and ears  NECK: no obvious masses on inspection  LUNGS: clear to auscultation bilaterally, no wheezes, rales or rhonchi, good air movement  CV: HRRR, no peripheral edema  MS: moves all extremities without noticeable abnormality  PSYCH: pleasant and cooperative, no obvious depression or anxiety  ASSESSMENT AND PLAN:  Discussed the following assessment and  plan:  Hypothyroid - Plan: TSH  Bone pain  PULMONARY EMBOLISM, HX OF - Plan: warfarin (COUMADIN) 5 MG tablet  PROSTATE CANCER, HX OF  Pathologic fracture of vertebra  Back pain, lumbosacral  -check TSH then refil synthroid -needs to establish with new PCP - discussed options, he will consider - may transfer to me -Patient advised to return or notify a doctor immediately if symptoms worsen or persist or new concerns arise.  There are no Patient Instructions on file for this visit.   Lucretia Kern

## 2013-07-02 NOTE — Progress Notes (Signed)
Pre visit review using our clinic review tool, if applicable. No additional management support is needed unless otherwise documented below in the visit note. 

## 2013-07-03 MED ORDER — LEVOTHYROXINE SODIUM 100 MCG PO TABS: 100.0000 ug | ORAL_TABLET | Freq: Every day | ORAL | Status: DC

## 2013-07-03 NOTE — Addendum Note (Signed)
Addended by: Monico Blitz T on: 07/03/2013 09:13 AM   Modules accepted: Orders

## 2013-07-15 ENCOUNTER — Telehealth: Payer: Self-pay | Admitting: Hematology and Oncology

## 2013-07-15 NOTE — Telephone Encounter (Signed)
returned pt call and confirmed 5.15 appt.

## 2013-07-17 ENCOUNTER — Other Ambulatory Visit: Payer: Medicare Other

## 2013-07-17 DIAGNOSIS — C9000 Multiple myeloma not having achieved remission: Secondary | ICD-10-CM

## 2013-07-17 LAB — COMPREHENSIVE METABOLIC PANEL (CC13)
ALK PHOS: 81 U/L (ref 40–150)
ALT: 36 U/L (ref 0–55)
AST: 26 U/L (ref 5–34)
Albumin: 3.7 g/dL (ref 3.5–5.0)
Anion Gap: 10 mEq/L (ref 3–11)
BILIRUBIN TOTAL: 0.53 mg/dL (ref 0.20–1.20)
BUN: 20.3 mg/dL (ref 7.0–26.0)
CO2: 19 mEq/L — ABNORMAL LOW (ref 22–29)
CREATININE: 1.3 mg/dL (ref 0.7–1.3)
Calcium: 9.4 mg/dL (ref 8.4–10.4)
Chloride: 110 mEq/L — ABNORMAL HIGH (ref 98–109)
GLUCOSE: 94 mg/dL (ref 70–140)
Potassium: 4.4 mEq/L (ref 3.5–5.1)
SODIUM: 140 meq/L (ref 136–145)
TOTAL PROTEIN: 7 g/dL (ref 6.4–8.3)

## 2013-07-17 LAB — CBC WITH DIFFERENTIAL/PLATELET
BASO%: 3 % — ABNORMAL HIGH (ref 0.0–2.0)
Basophils Absolute: 0.2 10*3/uL — ABNORMAL HIGH (ref 0.0–0.1)
EOS%: 3.6 % (ref 0.0–7.0)
Eosinophils Absolute: 0.2 10*3/uL (ref 0.0–0.5)
HEMATOCRIT: 41.2 % (ref 38.4–49.9)
HGB: 13.8 g/dL (ref 13.0–17.1)
LYMPH%: 26.4 % (ref 14.0–49.0)
MCH: 31.2 pg (ref 27.2–33.4)
MCHC: 33.5 g/dL (ref 32.0–36.0)
MCV: 93 fL (ref 79.3–98.0)
MONO#: 0.9 10*3/uL (ref 0.1–0.9)
MONO%: 17.9 % — AB (ref 0.0–14.0)
NEUT#: 2.6 10*3/uL (ref 1.5–6.5)
NEUT%: 49.1 % (ref 39.0–75.0)
PLATELETS: 166 10*3/uL (ref 140–400)
RBC: 4.43 10*6/uL (ref 4.20–5.82)
RDW: 15.6 % — ABNORMAL HIGH (ref 11.0–14.6)
WBC: 5.3 10*3/uL (ref 4.0–10.3)
lymph#: 1.4 10*3/uL (ref 0.9–3.3)

## 2013-08-13 ENCOUNTER — Ambulatory Visit (INDEPENDENT_AMBULATORY_CARE_PROVIDER_SITE_OTHER): Payer: Medicare Other | Admitting: General Practice

## 2013-08-13 DIAGNOSIS — Z5181 Encounter for therapeutic drug level monitoring: Secondary | ICD-10-CM

## 2013-08-13 DIAGNOSIS — D689 Coagulation defect, unspecified: Secondary | ICD-10-CM

## 2013-08-13 DIAGNOSIS — Z86718 Personal history of other venous thrombosis and embolism: Secondary | ICD-10-CM

## 2013-08-13 LAB — POCT INR: INR: 2.3

## 2013-08-13 NOTE — Progress Notes (Signed)
Pre visit review using our clinic review tool, if applicable. No additional management support is needed unless otherwise documented below in the visit note. 

## 2013-08-18 ENCOUNTER — Other Ambulatory Visit: Payer: Medicare Other

## 2013-08-18 ENCOUNTER — Telehealth: Payer: Self-pay | Admitting: *Deleted

## 2013-08-18 ENCOUNTER — Ambulatory Visit (HOSPITAL_BASED_OUTPATIENT_CLINIC_OR_DEPARTMENT_OTHER): Payer: Medicare Other | Admitting: Hematology and Oncology

## 2013-08-18 ENCOUNTER — Other Ambulatory Visit (HOSPITAL_BASED_OUTPATIENT_CLINIC_OR_DEPARTMENT_OTHER): Payer: Medicare Other

## 2013-08-18 ENCOUNTER — Other Ambulatory Visit: Payer: Self-pay | Admitting: Hematology and Oncology

## 2013-08-18 ENCOUNTER — Ambulatory Visit: Payer: Medicare Other

## 2013-08-18 ENCOUNTER — Telehealth: Payer: Self-pay | Admitting: Hematology and Oncology

## 2013-08-18 ENCOUNTER — Ambulatory Visit (HOSPITAL_BASED_OUTPATIENT_CLINIC_OR_DEPARTMENT_OTHER): Payer: Medicare Other

## 2013-08-18 ENCOUNTER — Encounter: Payer: Self-pay | Admitting: Hematology and Oncology

## 2013-08-18 VITALS — BP 131/7 | HR 66 | Temp 97.6°F | Resp 18 | Ht 71.0 in | Wt 230.9 lb

## 2013-08-18 DIAGNOSIS — M545 Low back pain, unspecified: Secondary | ICD-10-CM

## 2013-08-18 DIAGNOSIS — C9000 Multiple myeloma not having achieved remission: Secondary | ICD-10-CM

## 2013-08-18 DIAGNOSIS — I2699 Other pulmonary embolism without acute cor pulmonale: Secondary | ICD-10-CM

## 2013-08-18 LAB — COMPREHENSIVE METABOLIC PANEL (CC13)
ALBUMIN: 3.7 g/dL (ref 3.5–5.0)
ALT: 38 U/L (ref 0–55)
ANION GAP: 7 meq/L (ref 3–11)
AST: 26 U/L (ref 5–34)
Alkaline Phosphatase: 85 U/L (ref 40–150)
BUN: 22.3 mg/dL (ref 7.0–26.0)
CHLORIDE: 110 meq/L — AB (ref 98–109)
CO2: 21 mEq/L — ABNORMAL LOW (ref 22–29)
Calcium: 8.8 mg/dL (ref 8.4–10.4)
Creatinine: 1.3 mg/dL (ref 0.7–1.3)
GLUCOSE: 95 mg/dL (ref 70–140)
POTASSIUM: 4.3 meq/L (ref 3.5–5.1)
SODIUM: 139 meq/L (ref 136–145)
TOTAL PROTEIN: 7.2 g/dL (ref 6.4–8.3)
Total Bilirubin: 0.33 mg/dL (ref 0.20–1.20)

## 2013-08-18 LAB — CBC WITH DIFFERENTIAL/PLATELET
BASO%: 4.4 % — ABNORMAL HIGH (ref 0.0–2.0)
Basophils Absolute: 0.2 10*3/uL — ABNORMAL HIGH (ref 0.0–0.1)
EOS ABS: 0.1 10*3/uL (ref 0.0–0.5)
EOS%: 2.9 % (ref 0.0–7.0)
HCT: 42 % (ref 38.4–49.9)
HGB: 14 g/dL (ref 13.0–17.1)
LYMPH%: 27.5 % (ref 14.0–49.0)
MCH: 31.1 pg (ref 27.2–33.4)
MCHC: 33.3 g/dL (ref 32.0–36.0)
MCV: 93.3 fL (ref 79.3–98.0)
MONO#: 1 10*3/uL — ABNORMAL HIGH (ref 0.1–0.9)
MONO%: 20.8 % — ABNORMAL HIGH (ref 0.0–14.0)
NEUT%: 44.4 % (ref 39.0–75.0)
NEUTROS ABS: 2.1 10*3/uL (ref 1.5–6.5)
PLATELETS: 194 10*3/uL (ref 140–400)
RBC: 4.5 10*6/uL (ref 4.20–5.82)
RDW: 15.8 % — AB (ref 11.0–14.6)
WBC: 4.8 10*3/uL (ref 4.0–10.3)
lymph#: 1.3 10*3/uL (ref 0.9–3.3)

## 2013-08-18 MED ORDER — ZOLEDRONIC ACID 4 MG/100ML IV SOLN
4.0000 mg | Freq: Once | INTRAVENOUS | Status: AC
  Administered 2013-08-18: 4 mg via INTRAVENOUS
  Filled 2013-08-18: qty 100

## 2013-08-18 NOTE — Assessment & Plan Note (Signed)
This is chronic and is improving recently once he started taking glucosamine as well as Krill oil. I recommend he continue on calcium with high-dose vitamin D supplement. The patient will continue with Zometa every 3 months. He has no signs and symptoms to suggest osteonecrosis of the jaw.

## 2013-08-18 NOTE — Assessment & Plan Note (Signed)
He will continue on Coumadin. The patient denies any recent signs or symptoms of bleeding such as spontaneous epistaxis, hematuria or hematochezia.   

## 2013-08-18 NOTE — Patient Instructions (Signed)

## 2013-08-18 NOTE — Assessment & Plan Note (Signed)
He is doing very well on maintenance chemotherapy. I will continue Zometa every 3 months.

## 2013-08-18 NOTE — Telephone Encounter (Signed)
, °

## 2013-08-18 NOTE — Progress Notes (Signed)
[facility] OFFICE PROGRESS NOTE  Patient Care Team: Ricard Dillon, MD as PCP - General Terrilyn Saver, MD as Referring Physician (Internal Medicine) Heath Lark, MD as Consulting Physician (Hematology and Oncology)  SUMMARY OF ONCOLOGIC HISTORY:   Multiple myeloma   The patient was initially diagnosed in 2012. He had chemotherapy followed by autologous stem cell transplant [place] around October 2013. In April 2014 he was started on maintenance Revlimid 10 mg daily by mouth along with monthly Zometa. Starting on 05/18/2013, his Zometa is change to every 3 months.  INTERVAL HISTORY: Please see below for problem oriented charting. He is feeling better since his last visit. He started taking supplements with Krill oil and Glucosamine that helps with his bone pain tremendously.  REVIEW OF SYSTEMS:   Constitutional: Denies fevers, chills or abnormal weight loss Eyes: Denies blurriness of vision Ears, nose, mouth, throat, and face: Denies mucositis or sore throat Respiratory: Denies cough, dyspnea or wheezes Cardiovascular: Denies palpitation, chest discomfort or lower extremity swelling Gastrointestinal:  Denies nausea, heartburn or change in bowel habits Skin: Denies abnormal skin rashes Lymphatics: Denies new lymphadenopathy or easy bruising Neurological:Denies numbness, tingling or new weaknesses Behavioral/Psych: Mood is stable, no new changes  All other systems were reviewed with the patient and are negative.  I have reviewed the past medical history, past surgical history, social history and family history with the patient and they are unchanged from previous note.  ALLERGIES:  is allergic to blood-group specific substance.  MEDICATIONS:  Current Outpatient Prescriptions  Medication Sig Dispense Refill  . acetaminophen (TYLENOL) 325 MG tablet Take 650 mg by mouth. Take 650 mg by mouth every six (6) hours as needed.      .  calcium carbonate (TUMS - DOSED IN MG ELEMENTAL CALCIUM) 500 MG chewable tablet Chew 1 tablet by mouth every 2 (two) hours as needed. HEART BURN        . Cholecalciferol (VITAMIN D) 1000 UNITS capsule Take 5,000 Units by mouth daily.      . Glucosamine-Chondroitin (GLUCOSAMINE CHONDR COMPLEX PO) Take by mouth 2 (two) times daily.      Marland Kitchen KRILL OIL PO Take by mouth daily.      Marland Kitchen levothyroxine (SYNTHROID, LEVOTHROID) 100 MCG tablet Take 1 tablet (100 mcg total) by mouth daily.  30 tablet  6  . Loratadine (CLARITIN) 10 MG CAPS Take 10 mg by mouth daily.      . methylcellulose (ARTIFICIAL TEARS) 1 % ophthalmic solution Place 2 drops into both eyes 2 (two) times daily as needed. DRY EYES       . omeprazole (PRILOSEC) 20 MG capsule Take 20 mg by mouth 2 (two) times daily as needed. HEART BURN       . REVLIMID 10 MG capsule Take 10 mg by mouth daily.      Marland Kitchen warfarin (COUMADIN) 5 MG tablet Take as directed by anticoagulation clinic  35 tablet  11   No current facility-administered medications for this visit.    PHYSICAL EXAMINATION: ECOG PERFORMANCE STATUS: 0 - Asymptomatic  Filed Vitals:   08/18/13 0832  BP: 131/7  Pulse: 66  Temp: 97.6 F (36.4 C)  Resp: 18   Filed Weights   08/18/13 0832  Weight: 230 lb 14.4 oz (104.736 kg)    GENERAL:alert, no distress and comfortable SKIN: skin color, texture, turgor are normal, no rashes or significant lesions EYES: normal, Conjunctiva are pink and non-injected, sclera clear OROPHARYNX:no exudate, no  erythema and lips, buccal mucosa, and tongue normal  NECK: supple, thyroid normal size, non-tender, without nodularity LYMPH:  no palpable lymphadenopathy in the cervical, axillary or inguinal LUNGS: clear to auscultation and percussion with normal breathing effort HEART: regular rate & rhythm and no murmurs and no lower extremity edema ABDOMEN:abdomen soft, non-tender and normal bowel sounds Musculoskeletal:no cyanosis of digits and no clubbing   NEURO: alert & oriented x 3 with fluent speech, no focal motor/sensory deficits  LABORATORY DATA:  I have reviewed the data as listed    Component Value Date/Time   NA 139 08/18/2013 0819   NA 136 04/06/2012 1950   K 4.3 08/18/2013 0819   K 3.9 04/06/2012 1950   CL 112* 08/27/2012 1309   CL 103 04/06/2012 1950   CO2 21* 08/18/2013 0819   CO2 22 04/06/2012 1950   GLUCOSE 95 08/18/2013 0819   GLUCOSE 100* 08/27/2012 1309   GLUCOSE 110* 04/06/2012 1950   BUN 22.3 08/18/2013 0819   BUN 19 04/06/2012 1950   CREATININE 1.3 08/18/2013 0819   CREATININE 1.25 04/06/2012 1950   CREATININE 0.91 01/01/2011 1630   CALCIUM 8.8 08/18/2013 0819   CALCIUM 8.4 04/06/2012 1950   PROT 7.2 08/18/2013 0819   PROT 6.5 12/24/2011 1643   ALBUMIN 3.7 08/18/2013 0819   ALBUMIN 4.0 12/24/2011 1643   AST 26 08/18/2013 0819   AST 21 12/24/2011 1643   ALT 38 08/18/2013 0819   ALT 39 12/24/2011 1643   ALKPHOS 85 08/18/2013 0819   ALKPHOS 119* 12/24/2011 1643   BILITOT 0.33 08/18/2013 0819   BILITOT 0.3 12/24/2011 1643   GFRNONAA 57* 04/06/2012 1950   GFRAA 66* 04/06/2012 1950    No results found for this basename: SPEP,  UPEP,   kappa and lambda light chains    Lab Results  Component Value Date   WBC 4.8 08/18/2013   NEUTROABS 2.1 08/18/2013   HGB 14.0 08/18/2013   HCT 42.0 08/18/2013   MCV 93.3 08/18/2013   PLT 194 08/18/2013      Chemistry      Component Value Date/Time   NA 139 08/18/2013 0819   NA 136 04/06/2012 1950   K 4.3 08/18/2013 0819   K 3.9 04/06/2012 1950   CL 112* 08/27/2012 1309   CL 103 04/06/2012 1950   CO2 21* 08/18/2013 0819   CO2 22 04/06/2012 1950   BUN 22.3 08/18/2013 0819   BUN 19 04/06/2012 1950   CREATININE 1.3 08/18/2013 0819   CREATININE 1.25 04/06/2012 1950   CREATININE 0.91 01/01/2011 1630      Component Value Date/Time   CALCIUM 8.8 08/18/2013 0819   CALCIUM 8.4 04/06/2012 1950   ALKPHOS 85 08/18/2013 0819   ALKPHOS 119* 12/24/2011 1643   AST 26 08/18/2013 0819   AST 21 12/24/2011 1643   ALT 38  08/18/2013 0819   ALT 39 12/24/2011 1643   BILITOT 0.33 08/18/2013 0819   BILITOT 0.3 12/24/2011 1643      ASSESSMENT & PLAN:  Multiple myeloma He is doing very well on maintenance chemotherapy. I will continue Zometa every 3 months.  Back pain, lumbosacral This is chronic and is improving recently once he started taking glucosamine as well as Krill oil. I recommend he continue on calcium with high-dose vitamin D supplement. The patient will continue with Zometa every 3 months. He has no signs and symptoms to suggest osteonecrosis of the jaw.  Pulmonary embolism and infarction He will continue on Coumadin. The patient denies   any recent signs or symptoms of bleeding such as spontaneous epistaxis, hematuria or hematochezia.      All questions were answered. The patient knows to call the clinic with any problems, questions or concerns. No barriers to learning was detected. I spent 15 minutes counseling the patient face to face. The total time spent in the appointment was 20 minutes and more than 50% was on counseling and review of test results     GORSUCH, NI, MD 08/18/2013 12:22 PM    

## 2013-08-18 NOTE — Telephone Encounter (Signed)
Per staff message and POF I have scheduled appts. Advised scheduler of appts. JMW  

## 2013-09-09 ENCOUNTER — Emergency Department (HOSPITAL_COMMUNITY): Payer: No Typology Code available for payment source

## 2013-09-09 ENCOUNTER — Emergency Department (HOSPITAL_COMMUNITY)
Admission: EM | Admit: 2013-09-09 | Discharge: 2013-09-09 | Disposition: A | Discharge status: Home / Self Care | Payer: No Typology Code available for payment source | Attending: Emergency Medicine | Admitting: Emergency Medicine

## 2013-09-09 ENCOUNTER — Encounter (HOSPITAL_COMMUNITY): Payer: Self-pay | Admitting: Emergency Medicine

## 2013-09-09 DIAGNOSIS — Z86711 Personal history of pulmonary embolism: Secondary | ICD-10-CM | POA: Insufficient documentation

## 2013-09-09 DIAGNOSIS — M545 Low back pain, unspecified: Secondary | ICD-10-CM

## 2013-09-09 DIAGNOSIS — D649 Anemia, unspecified: Secondary | ICD-10-CM | POA: Insufficient documentation

## 2013-09-09 DIAGNOSIS — Z7901 Long term (current) use of anticoagulants: Secondary | ICD-10-CM | POA: Diagnosis not present

## 2013-09-09 DIAGNOSIS — Z79899 Other long term (current) drug therapy: Secondary | ICD-10-CM | POA: Diagnosis not present

## 2013-09-09 DIAGNOSIS — IMO0002 Reserved for concepts with insufficient information to code with codable children: Secondary | ICD-10-CM | POA: Diagnosis not present

## 2013-09-09 DIAGNOSIS — Z9889 Other specified postprocedural states: Secondary | ICD-10-CM | POA: Insufficient documentation

## 2013-09-09 DIAGNOSIS — Z8719 Personal history of other diseases of the digestive system: Secondary | ICD-10-CM | POA: Diagnosis not present

## 2013-09-09 DIAGNOSIS — Y9389 Activity, other specified: Secondary | ICD-10-CM | POA: Diagnosis not present

## 2013-09-09 DIAGNOSIS — S4980XA Other specified injuries of shoulder and upper arm, unspecified arm, initial encounter: Secondary | ICD-10-CM | POA: Diagnosis not present

## 2013-09-09 DIAGNOSIS — Z8739 Personal history of other diseases of the musculoskeletal system and connective tissue: Secondary | ICD-10-CM | POA: Diagnosis not present

## 2013-09-09 DIAGNOSIS — S0993XA Unspecified injury of face, initial encounter: Secondary | ICD-10-CM | POA: Diagnosis present

## 2013-09-09 DIAGNOSIS — Z8546 Personal history of malignant neoplasm of prostate: Secondary | ICD-10-CM | POA: Insufficient documentation

## 2013-09-09 DIAGNOSIS — R51 Headache: Secondary | ICD-10-CM | POA: Diagnosis not present

## 2013-09-09 DIAGNOSIS — S46909A Unspecified injury of unspecified muscle, fascia and tendon at shoulder and upper arm level, unspecified arm, initial encounter: Secondary | ICD-10-CM | POA: Insufficient documentation

## 2013-09-09 DIAGNOSIS — Z87891 Personal history of nicotine dependence: Secondary | ICD-10-CM | POA: Insufficient documentation

## 2013-09-09 DIAGNOSIS — Y92410 Unspecified street and highway as the place of occurrence of the external cause: Secondary | ICD-10-CM | POA: Diagnosis not present

## 2013-09-09 DIAGNOSIS — S199XXA Unspecified injury of neck, initial encounter: Principal | ICD-10-CM

## 2013-09-09 DIAGNOSIS — M542 Cervicalgia: Secondary | ICD-10-CM

## 2013-09-09 MED ORDER — HYDROCODONE-ACETAMINOPHEN 5-325 MG PO TABS: 1.0000 | ORAL_TABLET | ORAL | Status: DC | PRN

## 2013-09-09 MED ORDER — IBUPROFEN 600 MG PO TABS: 600.0000 mg | ORAL_TABLET | Freq: Three times a day (TID) | ORAL | Status: DC | PRN
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
## 2013-09-09 NOTE — ED Notes (Signed)
Patient transported to CT 
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
## 2013-09-09 NOTE — ED Notes (Signed)
NAD at this time.  
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
## 2013-09-24 ENCOUNTER — Ambulatory Visit: Payer: Medicare Other

## 2013-09-28 ENCOUNTER — Ambulatory Visit (INDEPENDENT_AMBULATORY_CARE_PROVIDER_SITE_OTHER): Payer: Medicare Other | Admitting: General Practice

## 2013-09-28 DIAGNOSIS — Z86718 Personal history of other venous thrombosis and embolism: Secondary | ICD-10-CM

## 2013-09-28 DIAGNOSIS — Z5181 Encounter for therapeutic drug level monitoring: Secondary | ICD-10-CM

## 2013-09-28 LAB — POCT INR: INR: 5.7

## 2013-09-28 NOTE — Progress Notes (Signed)
Pre visit review using our clinic review tool, if applicable. No additional management support is needed unless otherwise documented below in the visit note. 

## 2013-10-01 ENCOUNTER — Ambulatory Visit (INDEPENDENT_AMBULATORY_CARE_PROVIDER_SITE_OTHER): Payer: Medicare Other | Admitting: General Practice

## 2013-10-01 DIAGNOSIS — Z5181 Encounter for therapeutic drug level monitoring: Secondary | ICD-10-CM

## 2013-10-01 DIAGNOSIS — Z86718 Personal history of other venous thrombosis and embolism: Secondary | ICD-10-CM

## 2013-10-01 LAB — POCT INR
INR: 1.4
INR: 1.4

## 2013-10-01 NOTE — Progress Notes (Signed)
Pre visit review using our clinic review tool, if applicable. No additional management support is needed unless otherwise documented below in the visit note. 

## 2013-10-15 ENCOUNTER — Ambulatory Visit (INDEPENDENT_AMBULATORY_CARE_PROVIDER_SITE_OTHER): Payer: Medicare Other | Admitting: Family

## 2013-10-15 DIAGNOSIS — Z86718 Personal history of other venous thrombosis and embolism: Secondary | ICD-10-CM

## 2013-10-15 DIAGNOSIS — D689 Coagulation defect, unspecified: Secondary | ICD-10-CM

## 2013-10-15 DIAGNOSIS — Z5181 Encounter for therapeutic drug level monitoring: Secondary | ICD-10-CM

## 2013-10-15 LAB — POCT INR: INR: 2.3

## 2013-11-12 ENCOUNTER — Ambulatory Visit (INDEPENDENT_AMBULATORY_CARE_PROVIDER_SITE_OTHER): Payer: Medicare Other | Admitting: Family

## 2013-11-12 DIAGNOSIS — Z5181 Encounter for therapeutic drug level monitoring: Secondary | ICD-10-CM

## 2013-11-12 DIAGNOSIS — Z86718 Personal history of other venous thrombosis and embolism: Secondary | ICD-10-CM

## 2013-11-12 LAB — POCT INR: INR: 2.7

## 2013-11-12 NOTE — Patient Instructions (Signed)
Continue 1 tablet daily. Recheck in 4 weeks.   Anticoagulation Dose Instructions as of 11/12/2013     Rodney [name] Tue Wed Thu Fri Sat   New Dose 5 mg 5 mg 5 mg 5 mg 5 mg 5 mg 5 mg    Description       Continue 1 tablet daily. Recheck in 4 weeks.

## 2013-11-18 ENCOUNTER — Telehealth: Payer: Self-pay | Admitting: Hematology and Oncology

## 2013-11-18 ENCOUNTER — Ambulatory Visit (HOSPITAL_BASED_OUTPATIENT_CLINIC_OR_DEPARTMENT_OTHER): Payer: Medicare Other

## 2013-11-18 ENCOUNTER — Other Ambulatory Visit (HOSPITAL_BASED_OUTPATIENT_CLINIC_OR_DEPARTMENT_OTHER): Payer: Medicare Other

## 2013-11-18 ENCOUNTER — Encounter: Payer: Self-pay | Admitting: Hematology and Oncology

## 2013-11-18 ENCOUNTER — Ambulatory Visit (HOSPITAL_BASED_OUTPATIENT_CLINIC_OR_DEPARTMENT_OTHER): Payer: Medicare Other | Admitting: Hematology and Oncology

## 2013-11-18 VITALS — BP 118/75 | HR 63 | Temp 97.9°F | Resp 18 | Ht 70.0 in | Wt 232.9 lb

## 2013-11-18 DIAGNOSIS — C9000 Multiple myeloma not having achieved remission: Secondary | ICD-10-CM

## 2013-11-18 DIAGNOSIS — M949 Disorder of cartilage, unspecified: Secondary | ICD-10-CM

## 2013-11-18 DIAGNOSIS — M898X9 Other specified disorders of bone, unspecified site: Secondary | ICD-10-CM

## 2013-11-18 DIAGNOSIS — Z23 Encounter for immunization: Secondary | ICD-10-CM

## 2013-11-18 DIAGNOSIS — M899 Disorder of bone, unspecified: Secondary | ICD-10-CM

## 2013-11-18 DIAGNOSIS — C61 Malignant neoplasm of prostate: Secondary | ICD-10-CM

## 2013-11-18 LAB — COMPREHENSIVE METABOLIC PANEL (CC13)
ALT: 33 U/L (ref 0–55)
ANION GAP: 10 meq/L (ref 3–11)
AST: 23 U/L (ref 5–34)
Albumin: 3.4 g/dL — ABNORMAL LOW (ref 3.5–5.0)
Alkaline Phosphatase: 83 U/L (ref 40–150)
BILIRUBIN TOTAL: 0.59 mg/dL (ref 0.20–1.20)
BUN: 18 mg/dL (ref 7.0–26.0)
CALCIUM: 8.7 mg/dL (ref 8.4–10.4)
CO2: 20 meq/L — AB (ref 22–29)
Chloride: 110 mEq/L — ABNORMAL HIGH (ref 98–109)
Creatinine: 1.3 mg/dL (ref 0.7–1.3)
GLUCOSE: 88 mg/dL (ref 70–140)
POTASSIUM: 4 meq/L (ref 3.5–5.1)
Sodium: 140 mEq/L (ref 136–145)
Total Protein: 7 g/dL (ref 6.4–8.3)

## 2013-11-18 LAB — CBC WITH DIFFERENTIAL/PLATELET
BASO%: 2.9 % — ABNORMAL HIGH (ref 0.0–2.0)
Basophils Absolute: 0.2 10*3/uL — ABNORMAL HIGH (ref 0.0–0.1)
EOS ABS: 0.2 10*3/uL (ref 0.0–0.5)
EOS%: 3.1 % (ref 0.0–7.0)
HCT: 43.9 % (ref 38.4–49.9)
HGB: 14.4 g/dL (ref 13.0–17.1)
LYMPH%: 21.2 % (ref 14.0–49.0)
MCH: 31.3 pg (ref 27.2–33.4)
MCHC: 32.9 g/dL (ref 32.0–36.0)
MCV: 95 fL (ref 79.3–98.0)
MONO#: 0.9 10*3/uL (ref 0.1–0.9)
MONO%: 14.9 % — ABNORMAL HIGH (ref 0.0–14.0)
NEUT%: 57.9 % (ref 39.0–75.0)
NEUTROS ABS: 3.4 10*3/uL (ref 1.5–6.5)
PLATELETS: 181 10*3/uL (ref 140–400)
RBC: 4.62 10*6/uL (ref 4.20–5.82)
RDW: 16.5 % — ABNORMAL HIGH (ref 11.0–14.6)
WBC: 5.9 10*3/uL (ref 4.0–10.3)
lymph#: 1.3 10*3/uL (ref 0.9–3.3)

## 2013-11-18 MED ORDER — ZOLEDRONIC ACID 4 MG/100ML IV SOLN
4.0000 mg | Freq: Once | INTRAVENOUS | Status: AC
  Administered 2013-11-18: 4 mg via INTRAVENOUS
  Filled 2013-11-18: qty 100

## 2013-11-18 MED ORDER — SODIUM CHLORIDE 0.9 % IV SOLN
INTRAVENOUS | Status: DC
  Administered 2013-11-18: 10:00:00 via INTRAVENOUS

## 2013-11-18 MED ORDER — INFLUENZA VAC SPLIT QUAD 0.5 ML IM SUSY
0.5000 mL | PREFILLED_SYRINGE | Freq: Once | INTRAMUSCULAR | Status: AC
  Administered 2013-11-18: 0.5 mL via INTRAMUSCULAR
  Filled 2013-11-18: qty 0.5

## 2013-11-18 NOTE — Assessment & Plan Note (Signed)
Clinically, he is no signs of disease. His last PSA was undetectable.

## 2013-11-18 NOTE — Patient Instructions (Signed)

## 2013-11-18 NOTE — Assessment & Plan Note (Signed)
He is doing very well on maintenance chemotherapy. His most recent serum protein electrophoresis and immunofixation performed at [facility] showed complete remission. I will continue Zometa every 3 months.

## 2013-11-18 NOTE — Progress Notes (Signed)
[facility] OFFICE PROGRESS NOTE  Patient Care Team: Ricard Dillon, MD as PCP - General Terrilyn Saver, MD as Referring Physician (Internal Medicine) Heath Lark, MD as Consulting Physician (Hematology and Oncology)  SUMMARY OF ONCOLOGIC HISTORY:  The patient was initially diagnosed in 2012. He had chemotherapy followed by autologous stem cell transplant [place] around October 2013. In April 2014 he was started on maintenance Revlimid 10 mg daily by mouth along with monthly Zometa. Starting on 05/18/2013, his Zometa is change to every 3 months.   INTERVAL HISTORY: Please see below for problem oriented charting. He denies new bone pain. He has seen his physician at [facility] with blood work recently with labs which showed that he is in complete remission.   REVIEW OF SYSTEMS:   Constitutional: Denies fevers, chills or abnormal weight loss Eyes: Denies blurriness of vision Ears, nose, mouth, throat, and face: Denies mucositis or sore throat Respiratory: Denies cough, dyspnea or wheezes Cardiovascular: Denies palpitation, chest discomfort or lower extremity swelling Gastrointestinal:  Denies nausea, heartburn or change in bowel habits Skin: Denies abnormal skin rashes Lymphatics: Denies new lymphadenopathy or easy bruising Neurological:Denies numbness, tingling or new weaknesses Behavioral/Psych: Mood is stable, no new changes  All other systems were reviewed with the patient and are negative.  I have reviewed the past medical history, past surgical history, social history and family history with the patient and they are unchanged from previous note.  ALLERGIES:  is allergic to blood-group specific substance.  MEDICATIONS:  Current Outpatient Prescriptions  Medication Sig Dispense Refill  . acetaminophen (TYLENOL) 325 MG tablet Take 650 mg by mouth. Take 650 mg by mouth every six (6) hours as needed.      . calcium carbonate (TUMS  - DOSED IN MG ELEMENTAL CALCIUM) 500 MG chewable tablet Chew 1 tablet by mouth every 2 (two) hours as needed. HEART BURN        . Cholecalciferol (VITAMIN D) 1000 UNITS capsule Take 5,000 Units by mouth daily.      . Glucosamine-Chondroitin (GLUCOSAMINE CHONDR COMPLEX PO) Take by mouth 2 (two) times daily.      Marland Kitchen HYDROcodone-acetaminophen (NORCO/VICODIN) 5-325 MG per tablet Take 1 tablet by mouth every 4 (four) hours as needed for moderate pain.  12 tablet  0  . ibuprofen (ADVIL,MOTRIN) 600 MG tablet Take 1 tablet (600 mg total) by mouth every 8 (eight) hours as needed.  12 tablet  0  . KRILL OIL PO Take by mouth daily.      Marland Kitchen levothyroxine (SYNTHROID, LEVOTHROID) 100 MCG tablet Take 1 tablet (100 mcg total) by mouth daily.  30 tablet  6  . Loratadine (CLARITIN) 10 MG CAPS Take 10 mg by mouth daily.      . methylcellulose (ARTIFICIAL TEARS) 1 % ophthalmic solution Place 2 drops into both eyes 2 (two) times daily as needed. DRY EYES       . omeprazole (PRILOSEC) 20 MG capsule Take 20 mg by mouth 2 (two) times daily as needed. HEART BURN       . REVLIMID 10 MG capsule Take 10 mg by mouth daily.      Marland Kitchen warfarin (COUMADIN) 5 MG tablet Take as directed by anticoagulation clinic  35 tablet  11   Current Facility-Administered Medications  Medication Dose Route Frequency Provider Last Rate Last Dose  . Influenza vac split quadrivalent PF (FLUARIX) injection 0.5 mL  0.5 mL Intramuscular Once Heath Lark, MD  PHYSICAL EXAMINATION: ECOG PERFORMANCE STATUS: 0 - Asymptomatic  Filed Vitals:   11/18/13 0818  BP: 118/75  Pulse: 63  Temp: 97.9 F (36.6 C)  Resp: 18   Filed Weights   11/18/13 0818  Weight: 232 lb 14.4 oz (105.643 kg)    GENERAL:alert, no distress and comfortable SKIN: skin color, texture, turgor are normal, no rashes or significant lesions EYES: normal, Conjunctiva are pink and non-injected, sclera clear OROPHARYNX:no exudate, no erythema and lips, buccal mucosa, and tongue  normal  NECK: supple, thyroid normal size, non-tender, without nodularity LYMPH:  no palpable lymphadenopathy in the cervical, axillary or inguinal LUNGS: clear to auscultation and percussion with normal breathing effort HEART: regular rate & rhythm and no murmurs and no lower extremity edema ABDOMEN:abdomen soft, non-tender and normal bowel sounds Musculoskeletal:no cyanosis of digits and no clubbing  NEURO: alert & oriented x 3 with fluent speech, no focal motor/sensory deficits  LABORATORY DATA:  I have reviewed the data as listed    Component Value Date/Time   NA 140 11/18/2013 0756   NA 136 04/06/2012 1950   K 4.0 11/18/2013 0756   K 3.9 04/06/2012 1950   CL 112* 08/27/2012 1309   CL 103 04/06/2012 1950   CO2 20* 11/18/2013 0756   CO2 22 04/06/2012 1950   GLUCOSE 88 11/18/2013 0756   GLUCOSE 100* 08/27/2012 1309   GLUCOSE 110* 04/06/2012 1950   BUN 18.0 11/18/2013 0756   BUN 19 04/06/2012 1950   CREATININE 1.3 11/18/2013 0756   CREATININE 1.25 04/06/2012 1950   CREATININE 0.91 01/01/2011 1630   CALCIUM 8.7 11/18/2013 0756   CALCIUM 8.4 04/06/2012 1950   PROT 7.0 11/18/2013 0756   PROT 6.5 12/24/2011 1643   ALBUMIN 3.4* 11/18/2013 0756   ALBUMIN 4.0 12/24/2011 1643   AST 23 11/18/2013 0756   AST 21 12/24/2011 1643   ALT 33 11/18/2013 0756   ALT 39 12/24/2011 1643   ALKPHOS 83 11/18/2013 0756   ALKPHOS 119* 12/24/2011 1643   BILITOT 0.59 11/18/2013 0756   BILITOT 0.3 12/24/2011 1643   GFRNONAA 57* 04/06/2012 1950   GFRAA 66* 04/06/2012 1950    No results found for this basename: SPEP,  UPEP,   kappa and lambda light chains    Lab Results  Component Value Date   WBC 5.9 11/18/2013   NEUTROABS 3.4 11/18/2013   HGB 14.4 11/18/2013   HCT 43.9 11/18/2013   MCV 95.0 11/18/2013   PLT 181 11/18/2013      Chemistry      Component Value Date/Time   NA 140 11/18/2013 0756   NA 136 04/06/2012 1950   K 4.0 11/18/2013 0756   K 3.9 04/06/2012 1950   CL 112* 08/27/2012 1309   CL 103 04/06/2012 1950   CO2 20*  11/18/2013 0756   CO2 22 04/06/2012 1950   BUN 18.0 11/18/2013 0756   BUN 19 04/06/2012 1950   CREATININE 1.3 11/18/2013 0756   CREATININE 1.25 04/06/2012 1950   CREATININE 0.91 01/01/2011 1630      Component Value Date/Time   CALCIUM 8.7 11/18/2013 0756   CALCIUM 8.4 04/06/2012 1950   ALKPHOS 83 11/18/2013 0756   ALKPHOS 119* 12/24/2011 1643   AST 23 11/18/2013 0756   AST 21 12/24/2011 1643   ALT 33 11/18/2013 0756   ALT 39 12/24/2011 1643   BILITOT 0.59 11/18/2013 0756   BILITOT 0.3 12/24/2011 1643    ASSESSMENT & PLAN:  Multiple myeloma He is doing very well  on maintenance chemotherapy. His most recent serum protein electrophoresis and immunofixation performed at [facility] showed complete remission. I will continue Zometa every 3 months.    Malignant neoplasm of prostate Clinically, he is no signs of disease. His last PSA was undetectable.  Bone pain This is chronic and is improving recently once he started taking glucosamine as well as Krill oil. I recommend he continue on calcium with high-dose vitamin D supplement. The patient will continue with Zometa every 3 months. He has no signs and symptoms to suggest osteonecrosis of the jaw.   We discussed the importance of preventive care and reviewed the vaccination programs. He does not have any prior allergic reactions to influenza vaccination. He agrees to proceed with influenza vaccination today and we will administer it today at the clinic.  All questions were answered. The patient knows to call the clinic with any problems, questions or concerns. No barriers to learning was detected. I spent 25 minutes counseling the patient face to face. The total time spent in the appointment was 30 minutes and more than 50% was on counseling and review of test results     [facility], [place], MD 11/18/2013 9:19 AM

## 2013-11-18 NOTE — Telephone Encounter (Signed)
gv adn printed appt sched and avs for pt for Dec....sed added tx. °

## 2013-11-18 NOTE — Assessment & Plan Note (Signed)
This is chronic and is improving recently once he started taking glucosamine as well as Krill oil. I recommend he continue on calcium with high-dose vitamin D supplement. The patient will continue with Zometa every 3 months. He has no signs and symptoms to suggest osteonecrosis of the jaw.

## 2013-11-19 ENCOUNTER — Telehealth: Payer: Self-pay | Admitting: Hematology and Oncology

## 2013-11-19 NOTE — Telephone Encounter (Signed)
pt called to r/s appt..done...pt aware of new d.t °

## 2013-11-26 ENCOUNTER — Telehealth: Payer: Self-pay | Admitting: *Deleted

## 2013-11-26 NOTE — Telephone Encounter (Signed)
Dr. Bobby Rumpf, [place], [phone], called to follow up on letter and X ray reports he sent to Dr. Alvy Bimler.  He is concerned about possible metastasis to pt's pelvis.  He also noted lytic lesions in c-spine.   Informed Dr. Bobby Rumpf that Dr. Alvy Bimler did receive that letter yesterday.  She is out of office today.  He said he did not need a call back.  He just wanted to make sure she got it.

## 2013-11-30 ENCOUNTER — Telehealth: Payer: Self-pay

## 2013-11-30 NOTE — Telephone Encounter (Signed)
Spoke with patient and informed him that Dr Alvy Bimler had received the letter and report of pelvic x-ray.  Advised him that she recommends he has his PSA rechecked and we could schedule him this week or within the next month, whichever is more convenient for him.  Patient stated he "would have to think about it" and would call us back.  Patient has office phone number and will ask for Dr Calton Dach nurse.  Will call back with any other questions or concerns.

## 2013-12-10 ENCOUNTER — Ambulatory Visit (INDEPENDENT_AMBULATORY_CARE_PROVIDER_SITE_OTHER): Payer: Medicare Other | Admitting: Family

## 2013-12-10 ENCOUNTER — Ambulatory Visit: Payer: Medicare Other

## 2013-12-10 DIAGNOSIS — Z86718 Personal history of other venous thrombosis and embolism: Secondary | ICD-10-CM

## 2013-12-10 DIAGNOSIS — Z5181 Encounter for therapeutic drug level monitoring: Secondary | ICD-10-CM

## 2013-12-10 LAB — POCT INR: INR: 2

## 2013-12-10 NOTE — Patient Instructions (Signed)
Continue 1 tablet daily. Recheck in 4 weeks.   Anticoagulation Dose Instructions as of 12/10/2013     Rodney [name] Tue Wed Thu Fri Sat   New Dose 5 mg 5 mg 5 mg 5 mg 5 mg 5 mg 5 mg    Description       Continue 1 tablet daily. Recheck in 4 weeks.

## 2014-01-11 ENCOUNTER — Ambulatory Visit (INDEPENDENT_AMBULATORY_CARE_PROVIDER_SITE_OTHER): Payer: Medicare Other | Admitting: Family

## 2014-01-11 ENCOUNTER — Emergency Department (HOSPITAL_COMMUNITY): Payer: No Typology Code available for payment source

## 2014-01-11 ENCOUNTER — Encounter (HOSPITAL_COMMUNITY): Payer: Self-pay | Admitting: Emergency Medicine

## 2014-01-11 ENCOUNTER — Emergency Department (HOSPITAL_COMMUNITY)
Admission: EM | Admit: 2014-01-11 | Discharge: 2014-01-11 | Disposition: A | Discharge status: Home / Self Care | Payer: No Typology Code available for payment source | Attending: Emergency Medicine | Admitting: Emergency Medicine

## 2014-01-11 DIAGNOSIS — Z86718 Personal history of other venous thrombosis and embolism: Secondary | ICD-10-CM

## 2014-01-11 DIAGNOSIS — Z8546 Personal history of malignant neoplasm of prostate: Secondary | ICD-10-CM | POA: Insufficient documentation

## 2014-01-11 DIAGNOSIS — Z8579 Personal history of other malignant neoplasms of lymphoid, hematopoietic and related tissues: Secondary | ICD-10-CM | POA: Insufficient documentation

## 2014-01-11 DIAGNOSIS — Z9889 Other specified postprocedural states: Secondary | ICD-10-CM | POA: Diagnosis not present

## 2014-01-11 DIAGNOSIS — Z85830 Personal history of malignant neoplasm of bone: Secondary | ICD-10-CM | POA: Diagnosis not present

## 2014-01-11 DIAGNOSIS — Y998 Other external cause status: Secondary | ICD-10-CM | POA: Diagnosis not present

## 2014-01-11 DIAGNOSIS — Y92410 Unspecified street and highway as the place of occurrence of the external cause: Secondary | ICD-10-CM | POA: Diagnosis not present

## 2014-01-11 DIAGNOSIS — D649 Anemia, unspecified: Secondary | ICD-10-CM | POA: Diagnosis not present

## 2014-01-11 DIAGNOSIS — Z7901 Long term (current) use of anticoagulants: Secondary | ICD-10-CM | POA: Insufficient documentation

## 2014-01-11 DIAGNOSIS — S24109A Unspecified injury at unspecified level of thoracic spinal cord, initial encounter: Secondary | ICD-10-CM | POA: Insufficient documentation

## 2014-01-11 DIAGNOSIS — D689 Coagulation defect, unspecified: Secondary | ICD-10-CM

## 2014-01-11 DIAGNOSIS — Y9389 Activity, other specified: Secondary | ICD-10-CM | POA: Insufficient documentation

## 2014-01-11 DIAGNOSIS — Z86711 Personal history of pulmonary embolism: Secondary | ICD-10-CM | POA: Diagnosis not present

## 2014-01-11 DIAGNOSIS — S3992XA Unspecified injury of lower back, initial encounter: Secondary | ICD-10-CM | POA: Insufficient documentation

## 2014-01-11 DIAGNOSIS — Z79899 Other long term (current) drug therapy: Secondary | ICD-10-CM | POA: Diagnosis not present

## 2014-01-11 DIAGNOSIS — Z87891 Personal history of nicotine dependence: Secondary | ICD-10-CM | POA: Insufficient documentation

## 2014-01-11 DIAGNOSIS — S199XXA Unspecified injury of neck, initial encounter: Secondary | ICD-10-CM | POA: Insufficient documentation

## 2014-01-11 DIAGNOSIS — Z5181 Encounter for therapeutic drug level monitoring: Secondary | ICD-10-CM

## 2014-01-11 LAB — POCT INR
INR: 3.3
INR: 3.3

## 2014-01-11 MED ORDER — KETOROLAC TROMETHAMINE 60 MG/2ML IM SOLN
60.0000 mg | Freq: Once | INTRAMUSCULAR | Status: AC
  Administered 2014-01-11: 60 mg via INTRAMUSCULAR
  Filled 2014-01-11: qty 2
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
## 2014-01-11 NOTE — Patient Instructions (Signed)
Hold coumadin today only. Recheck in 4 weeks.   Anticoagulation Dose Instructions as of 01/11/2014      Rodney [name] Tue Wed Thu Fri Sat   New Dose 5 mg 5 mg 5 mg 5 mg 5 mg 5 mg 5 mg    Description        Hold coumadin today only. Recheck in 4 weeks.
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
## 2014-01-11 NOTE — ED Notes (Signed)
Pt states pain is central back/midline, as well as lower back/midline.

## 2014-01-11 NOTE — ED Notes (Signed)
Family at bedside. Call light in reach. States no needs at this time
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
## 2014-01-26 ENCOUNTER — Telehealth: Payer: Self-pay | Admitting: Internal Medicine

## 2014-01-26 NOTE — Telephone Encounter (Addendum)
Pt thought he was est with dr Maudie Mercury and per  Office visit on 07-02-13 dr kim gave him the option of transferring to her. Pt was sch for an acute visit to see dr hunter on 11-25 and I call the patient to set up official appt with dr Maudie Mercury. Pt stated he will harass the h out of dr kim. I said sir you do not want to do that.

## 2014-01-27 ENCOUNTER — Ambulatory Visit (INDEPENDENT_AMBULATORY_CARE_PROVIDER_SITE_OTHER): Payer: Medicare Other | Admitting: Family Medicine

## 2014-01-27 ENCOUNTER — Encounter: Payer: Self-pay | Admitting: Family Medicine

## 2014-01-27 VITALS — BP 110/72 | HR 64 | Temp 98.0°F | Wt 222.0 lb

## 2014-01-27 DIAGNOSIS — J0110 Acute frontal sinusitis, unspecified: Secondary | ICD-10-CM

## 2014-01-27 MED ORDER — AMOXICILLIN-POT CLAVULANATE 875-125 MG PO TABS: 1.0000 | ORAL_TABLET | Freq: Two times a day (BID) | ORAL | Status: DC

## 2014-01-27 NOTE — Patient Instructions (Signed)
Sinusitis  This could be viral but given your history I am giving you an antibiotic to use for the following-if your symptoms do not continue to improve or if they worsen. If symptoms do not improve within 48 hours of medication or worsen, please seek care.

## 2014-01-27 NOTE — Progress Notes (Signed)
  Garret Reddish, MD Phone: [phone]  Subjective:   Rodney [name] is a 73 y.o. year old very pleasant male patient who presents with the following:  Sinus congestion/pressure Cough and congestion for 1 week. Coughing up yellow phlegm without blood. Fatigued. Temperature up to 100.1. Cough syrup-robitussen. Sinus pain and pressure frontal areas bilateral. Mild ear pressure. Symptoms worsened for 5 days and have eased each of last 2 day.  ROS- no chest pain or shortness of breath.   Past Medical History- hypothyroidism, allergic rhinitis, hx prostate cancer, hx PE on coumadin, hxc multiple myeloma (on active chemotherapy 3 weeks on and 1 week off, zometa for bone health)  Medications- reviewed and updated Current Outpatient Prescriptions  Medication Sig Dispense Refill  . calcium carbonate (TUMS - DOSED IN MG ELEMENTAL CALCIUM) 500 MG chewable tablet Chew 1 tablet by mouth every 2 (two) hours as needed. HEART BURN      . Cholecalciferol (VITAMIN D) 1000 UNITS capsule Take 5,000 Units by mouth daily.    . Glucosamine-Chondroitin (GLUCOSAMINE CHONDR COMPLEX PO) Take 1 tablet by mouth 2 (two) times daily.     Marland Kitchen ibuprofen (ADVIL,MOTRIN) 600 MG tablet Take 1 tablet (600 mg total) by mouth every 8 (eight) hours as needed. 12 tablet 0  . KRILL OIL PO Take 1 tablet by mouth 2 (two) times daily.     Marland Kitchen levothyroxine (SYNTHROID, LEVOTHROID) 100 MCG tablet Take 1 tablet (100 mcg total) by mouth daily. 30 tablet 6  . Loratadine (CLARITIN) 10 MG CAPS Take 10 mg by mouth daily.    . methylcellulose (ARTIFICIAL TEARS) 1 % ophthalmic solution Place 2 drops into both eyes 4 (four) times daily as needed (dry eyes).     Marland Kitchen omeprazole (PRILOSEC) 20 MG capsule Take 20 mg by mouth 2 (two) times daily as needed. HEART BURN     . REVLIMID 10 MG capsule Take 10 mg by mouth daily.    Marland Kitchen warfarin (COUMADIN) 5 MG tablet Take as directed by anticoagulation clinic (Patient taking differently: Take 5 mg by mouth  every evening. ) 35 tablet 11  . acetaminophen (TYLENOL) 325 MG tablet Take 650 mg by mouth. Take 650 mg by mouth every six (6) hours as needed.    Marland Kitchen HYDROcodone-acetaminophen (NORCO/VICODIN) 5-325 MG per tablet Take 1 tablet by mouth every 4 (four) hours as needed for moderate pain. (Patient not taking: Reported on 01/27/2014) 12 tablet 0   No current facility-administered medications for this visit.    Objective: BP 110/72 mmHg  Pulse 64  Temp(Src) 98 F (36.7 C)  Wt 222 lb (100.699 kg)  SpO2 96% Gen: NAD, resting comfortably,intermittent coarse cough HEENT: nares erythematous and swollen with yellow exudate, oropharynx normal with drainage on pharynx noted but normal tonsils, TM normal bilaterally, Mucous membranes are moist. Frontal sinus tenderness R >L noted.  CV: RRR no murmurs rubs or gallops Lungs: CTAB no crackles, wheeze, rhonchi Ext: no edema Skin: warm, dry, no rash Neuro: grossly normal, moves all extremities   Assessment/Plan:  Sinusitis Probably viral but given on immunomodulator and with multiple myeloma, provided augmentin x 7 days to be used if he does not continue to improve or if symptoms worsen. Strict return precautions given.   Meds ordered this encounter  Medications  . amoxicillin-clavulanate (AUGMENTIN) 875-125 MG per tablet    Sig: Take 1 tablet by mouth 2 (two) times daily.    Dispense:  14 tablet    Refill:  0

## 2014-02-01 ENCOUNTER — Ambulatory Visit (INDEPENDENT_AMBULATORY_CARE_PROVIDER_SITE_OTHER): Payer: Medicare Other | Admitting: Family Medicine

## 2014-02-01 ENCOUNTER — Encounter: Payer: Self-pay | Admitting: Family Medicine

## 2014-02-01 VITALS — BP 102/74 | HR 62 | Temp 97.5°F | Ht 70.0 in | Wt 227.3 lb

## 2014-02-01 DIAGNOSIS — C9000 Multiple myeloma not having achieved remission: Secondary | ICD-10-CM

## 2014-02-01 DIAGNOSIS — K219 Gastro-esophageal reflux disease without esophagitis: Secondary | ICD-10-CM

## 2014-02-01 DIAGNOSIS — M545 Low back pain, unspecified: Secondary | ICD-10-CM

## 2014-02-01 DIAGNOSIS — Z86718 Personal history of other venous thrombosis and embolism: Secondary | ICD-10-CM

## 2014-02-01 DIAGNOSIS — E039 Hypothyroidism, unspecified: Secondary | ICD-10-CM

## 2014-02-01 DIAGNOSIS — Z8546 Personal history of malignant neoplasm of prostate: Secondary | ICD-10-CM

## 2014-02-01 DIAGNOSIS — M899 Disorder of bone, unspecified: Secondary | ICD-10-CM

## 2014-02-01 DIAGNOSIS — M898X9 Other specified disorders of bone, unspecified site: Secondary | ICD-10-CM

## 2014-02-01 DIAGNOSIS — J309 Allergic rhinitis, unspecified: Secondary | ICD-10-CM

## 2014-02-01 DIAGNOSIS — M5489 Other dorsalgia: Secondary | ICD-10-CM

## 2014-02-01 HISTORY — DX: Hypothyroidism, unspecified: E03.9

## 2014-02-01 NOTE — Progress Notes (Signed)
Pre visit review using our clinic review tool, if applicable. No additional management support is needed unless otherwise documented below in the visit note. 

## 2014-02-01 NOTE — Patient Instructions (Signed)
-  Please see me every 6 months if you want me to follow up on your thyroid, if checking elsewhere please see me at least yearly
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
## 2014-02-08 ENCOUNTER — Ambulatory Visit (INDEPENDENT_AMBULATORY_CARE_PROVIDER_SITE_OTHER): Payer: Medicare Other | Admitting: Family

## 2014-02-08 DIAGNOSIS — Z86718 Personal history of other venous thrombosis and embolism: Secondary | ICD-10-CM

## 2014-02-08 LAB — POCT INR: INR: 2.7

## 2014-02-08 NOTE — Patient Instructions (Signed)
Recheck in 4 weeks.   Anticoagulation Dose Instructions as of 02/08/2014      Dorene Grebe Tue Wed Thu Fri Sat   New Dose 5 mg 5 mg 5 mg 5 mg 5 mg 5 mg 5 mg    Description         Recheck in 4 weeks.     '

## 2014-02-17 ENCOUNTER — Ambulatory Visit: Payer: Medicare Other

## 2014-02-17 ENCOUNTER — Ambulatory Visit: Payer: Medicare Other | Admitting: Hematology and Oncology

## 2014-02-17 ENCOUNTER — Other Ambulatory Visit: Payer: Medicare Other

## 2014-02-18 ENCOUNTER — Ambulatory Visit (HOSPITAL_BASED_OUTPATIENT_CLINIC_OR_DEPARTMENT_OTHER): Payer: Medicare Other

## 2014-02-18 ENCOUNTER — Ambulatory Visit (HOSPITAL_BASED_OUTPATIENT_CLINIC_OR_DEPARTMENT_OTHER): Payer: Medicare Other | Admitting: Hematology and Oncology

## 2014-02-18 ENCOUNTER — Encounter: Payer: Self-pay | Admitting: Hematology and Oncology

## 2014-02-18 ENCOUNTER — Telehealth: Payer: Self-pay | Admitting: Hematology and Oncology

## 2014-02-18 ENCOUNTER — Other Ambulatory Visit: Payer: Medicare Other

## 2014-02-18 ENCOUNTER — Other Ambulatory Visit: Payer: Self-pay | Admitting: *Deleted

## 2014-02-18 VITALS — BP 125/65 | HR 60 | Temp 97.9°F | Resp 18 | Ht 70.0 in | Wt 226.9 lb

## 2014-02-18 DIAGNOSIS — M545 Low back pain, unspecified: Secondary | ICD-10-CM

## 2014-02-18 DIAGNOSIS — C9000 Multiple myeloma not having achieved remission: Secondary | ICD-10-CM

## 2014-02-18 DIAGNOSIS — M5489 Other dorsalgia: Secondary | ICD-10-CM

## 2014-02-18 MED ORDER — ZOLEDRONIC ACID 4 MG/100ML IV SOLN
4.0000 mg | Freq: Once | INTRAVENOUS | Status: AC
  Administered 2014-02-18: 4 mg via INTRAVENOUS
  Filled 2014-02-18: qty 100

## 2014-02-18 MED ORDER — CYCLOBENZAPRINE HCL 10 MG PO TABS: 10.0000 mg | ORAL_TABLET | Freq: Three times a day (TID) | ORAL | Status: DC | PRN

## 2014-02-18 NOTE — Patient Instructions (Signed)
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
## 2014-02-18 NOTE — Telephone Encounter (Signed)
Duplicate

## 2014-02-18 NOTE — Telephone Encounter (Signed)
Gave avs & cal for March. °
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
## 2014-02-18 NOTE — Assessment & Plan Note (Signed)
He is doing very well on maintenance chemotherapy. His most recent serum protein electrophoresis and immunofixation performed at [facility] showed complete remission. I will continue Zometa every 3 months.

## 2014-02-24 ENCOUNTER — Encounter: Payer: Self-pay | Admitting: Family

## 2014-03-11 ENCOUNTER — Ambulatory Visit (INDEPENDENT_AMBULATORY_CARE_PROVIDER_SITE_OTHER): Payer: Medicare Other | Admitting: Family

## 2014-03-11 DIAGNOSIS — Z86718 Personal history of other venous thrombosis and embolism: Secondary | ICD-10-CM | POA: Diagnosis not present

## 2014-03-11 LAB — POCT INR: INR: 3.5

## 2014-03-11 NOTE — Patient Instructions (Addendum)
Hold coumadin today.  Then continue 5 mg daily. Recheck in 4 weeks.   Anticoagulation Dose Instructions as of 03/11/2014      Rodney [name] Tue Wed Thu Fri Sat   New Dose 5 mg 5 mg 5 mg 5 mg 5 mg 5 mg 5 mg    Description        Hold coumadin today.  Then continue 5 mg daily. Recheck in 4 weeks.

## 2014-03-16 ENCOUNTER — Other Ambulatory Visit: Payer: Self-pay | Admitting: Hematology and Oncology

## 2014-03-16 ENCOUNTER — Telehealth: Payer: Self-pay | Admitting: *Deleted

## 2014-03-16 DIAGNOSIS — M545 Low back pain, unspecified: Secondary | ICD-10-CM

## 2014-03-16 DIAGNOSIS — C9000 Multiple myeloma not having achieved remission: Secondary | ICD-10-CM

## 2014-03-16 MED ORDER — CYCLOBENZAPRINE HCL 10 MG PO TABS: 10.0000 mg | ORAL_TABLET | Freq: Three times a day (TID) | ORAL | Status: DC | PRN

## 2014-03-16 NOTE — Telephone Encounter (Signed)
Informed pt of refill flexeril done by Dr. Alvy Bimler.  He verbalized understanding.

## 2014-03-16 NOTE — Telephone Encounter (Signed)
Pt reports taking flexeril for back pain.  It did not help much when he tried it during the day but he is taking one every evening and says it is helping some.  He is sleeping better.  He asks for refill sent to [facility] on [address].

## 2014-03-16 NOTE — Telephone Encounter (Signed)
done

## 2014-03-31 ENCOUNTER — Other Ambulatory Visit: Payer: Self-pay | Admitting: Family Medicine

## 2014-04-08 ENCOUNTER — Ambulatory Visit (INDEPENDENT_AMBULATORY_CARE_PROVIDER_SITE_OTHER): Payer: Medicare Other | Admitting: General Practice

## 2014-04-08 DIAGNOSIS — Z86718 Personal history of other venous thrombosis and embolism: Secondary | ICD-10-CM

## 2014-04-08 LAB — POCT INR: INR: 3.3

## 2014-04-08 NOTE — Progress Notes (Signed)
Pre visit review using our clinic review tool, if applicable. No additional management support is needed unless otherwise documented below in the visit note. 

## 2014-04-12 ENCOUNTER — Telehealth: Payer: Self-pay | Admitting: General Practice

## 2014-04-12 ENCOUNTER — Telehealth: Payer: Self-pay

## 2014-04-12 NOTE — Telephone Encounter (Signed)
Rx request for warfarin 5 mg tablet-Take as directed by anticoagulation clinic  Pharm:  [facility] [address]  Note from pharmacy:  This is a narrow therapeutic medication.  We need permission to change manufactures, because a change may affect the patient's levels and INR.  The patient has been on Zydus generic warfarin.  That is no longer available from the manufacturer.  May we change to [facility] generic warfarin? Please call [phone] if you have questions.

## 2014-04-12 NOTE — Telephone Encounter (Signed)
Spoke with patient to notify of the change of manufacturer for coumadin.  Patient says to go ahead and call in re-fill.  [facility] [address] notified that change has been approved by patient.

## 2014-05-05 DIAGNOSIS — C9000 Multiple myeloma not having achieved remission: Secondary | ICD-10-CM | POA: Diagnosis not present

## 2014-05-06 ENCOUNTER — Ambulatory Visit: Payer: Medicare Other

## 2014-05-06 ENCOUNTER — Ambulatory Visit (INDEPENDENT_AMBULATORY_CARE_PROVIDER_SITE_OTHER): Payer: Medicare Other | Admitting: General Practice

## 2014-05-06 DIAGNOSIS — Z86718 Personal history of other venous thrombosis and embolism: Secondary | ICD-10-CM | POA: Diagnosis not present

## 2014-05-06 LAB — POCT INR: INR: 3

## 2014-05-06 NOTE — Progress Notes (Signed)
Pre visit review using our clinic review tool, if applicable. No additional management support is needed unless otherwise documented below in the visit note. 

## 2014-05-10 ENCOUNTER — Telehealth: Payer: Self-pay | Admitting: Hematology and Oncology

## 2014-05-10 NOTE — Telephone Encounter (Signed)
lvm for pt that 3.17 appt cx and moved to 3.24 per MD staff.Marland KitchenMarland KitchenMarland Kitchen

## 2014-05-14 ENCOUNTER — Other Ambulatory Visit: Payer: Self-pay | Admitting: Family Medicine

## 2014-05-20 ENCOUNTER — Ambulatory Visit: Payer: Medicare Other | Admitting: Hematology and Oncology

## 2014-05-20 ENCOUNTER — Ambulatory Visit: Payer: Medicare Other

## 2014-05-27 ENCOUNTER — Ambulatory Visit (HOSPITAL_BASED_OUTPATIENT_CLINIC_OR_DEPARTMENT_OTHER): Payer: Medicare Other | Admitting: Hematology and Oncology

## 2014-05-27 ENCOUNTER — Telehealth: Payer: Self-pay | Admitting: Hematology and Oncology

## 2014-05-27 ENCOUNTER — Other Ambulatory Visit: Payer: Self-pay | Admitting: Hematology and Oncology

## 2014-05-27 ENCOUNTER — Encounter: Payer: Self-pay | Admitting: Hematology and Oncology

## 2014-05-27 ENCOUNTER — Ambulatory Visit (HOSPITAL_BASED_OUTPATIENT_CLINIC_OR_DEPARTMENT_OTHER): Payer: Medicare Other

## 2014-05-27 ENCOUNTER — Telehealth: Payer: Self-pay | Admitting: *Deleted

## 2014-05-27 VITALS — BP 122/72 | HR 64 | Temp 98.3°F | Resp 18 | Ht 70.0 in | Wt 219.1 lb

## 2014-05-27 DIAGNOSIS — C9000 Multiple myeloma not having achieved remission: Secondary | ICD-10-CM

## 2014-05-27 DIAGNOSIS — M5489 Other dorsalgia: Secondary | ICD-10-CM

## 2014-05-27 DIAGNOSIS — M545 Low back pain, unspecified: Secondary | ICD-10-CM

## 2014-05-27 DIAGNOSIS — I2699 Other pulmonary embolism without acute cor pulmonale: Secondary | ICD-10-CM | POA: Diagnosis not present

## 2014-05-27 MED ORDER — ZOLEDRONIC ACID 4 MG/100ML IV SOLN
4.0000 mg | Freq: Once | INTRAVENOUS | Status: AC
  Administered 2014-05-27: 4 mg via INTRAVENOUS
  Filled 2014-05-27: qty 100

## 2014-05-27 MED ORDER — SODIUM CHLORIDE 0.9 % IV SOLN
INTRAVENOUS | Status: DC
  Administered 2014-05-27: 09:00:00 via INTRAVENOUS

## 2014-05-27 NOTE — Telephone Encounter (Signed)
Gave avs & calendar for June. Sent messgae to schedule treatment

## 2014-05-27 NOTE — Telephone Encounter (Signed)
Per staff message and POF I have scheduled appts. Advised scheduler of appts. JMW  

## 2014-05-27 NOTE — Patient Instructions (Signed)

## 2014-05-27 NOTE — Assessment & Plan Note (Signed)
He is doing very well on maintenance chemotherapy. His most recent serum protein electrophoresis and immunofixation performed at [facility] showed complete remission. I will continue Zometa every 3 months. The patient had dental clearance but the dentist recently. He will continue taking calcium with vitamin D supplement.

## 2014-05-27 NOTE — Progress Notes (Signed)
[place] OFFICE PROGRESS NOTE  Patient Care Team: Lucretia Kern, DO as PCP - General (Family Medicine) Terrilyn Saver, MD as Referring Physician (Internal Medicine) Heath Lark, MD as Consulting Physician (Hematology and Oncology)  SUMMARY OF ONCOLOGIC HISTORY:  The patient was initially diagnosed in 2012. He had chemotherapy followed by autologous stem cell transplant [place] around October 2013. In April 2014 he was started on maintenance Revlimid 10 mg daily by mouth along with monthly Zometa. Starting on 05/18/2013, his Zometa is change to every 3 months.  INTERVAL HISTORY: Please see below for problem oriented charting. He feels well. The patient continues to work. He has persistent chronic lower back pain, unchanged. Denies recent fracture. No recent infection. The patient denies any recent signs or symptoms of bleeding such as spontaneous epistaxis, hematuria or hematochezia.   REVIEW OF SYSTEMS:   Constitutional: Denies fevers, chills or abnormal weight loss Eyes: Denies blurriness of vision Ears, nose, mouth, throat, and face: Denies mucositis or sore throat Respiratory: Denies cough, dyspnea or wheezes Cardiovascular: Denies palpitation, chest discomfort or lower extremity swelling Gastrointestinal:  Denies nausea, heartburn or change in bowel habits Skin: Denies abnormal skin rashes Lymphatics: Denies new lymphadenopathy or easy bruising Neurological:Denies numbness, tingling or new weaknesses Behavioral/Psych: Mood is stable, no new changes  All other systems were reviewed with the patient and are negative.  I have reviewed the past medical history, past surgical history, social history and family history with the patient and they are unchanged from previous note.  ALLERGIES:  is allergic to blood-group specific substance.  MEDICATIONS:  Current Outpatient Prescriptions  Medication Sig Dispense Refill  . calcium  carbonate (TUMS - DOSED IN MG ELEMENTAL CALCIUM) 500 MG chewable tablet Chew 1 tablet by mouth every 2 (two) hours as needed. HEART BURN      . Cholecalciferol (VITAMIN D) 1000 UNITS capsule Take 5,000 Units by mouth daily.    . cyclobenzaprine (FLEXERIL) 10 MG tablet Take 1 tablet (10 mg total) by mouth 3 (three) times daily as needed for muscle spasms. 30 tablet 3  . Glucosamine-Chondroitin (GLUCOSAMINE CHONDR COMPLEX PO) Take 1 tablet by mouth 2 (two) times daily.     Marland Kitchen KRILL OIL PO Take 1 tablet by mouth 2 (two) times daily.     Marland Kitchen levothyroxine (SYNTHROID, LEVOTHROID) 100 MCG tablet TAKE ONE TABLET BY MOUTH ONCE DAILY 30 tablet 2  . methylcellulose (ARTIFICIAL TEARS) 1 % ophthalmic solution Place 2 drops into both eyes 4 (four) times daily as needed (dry eyes).     Marland Kitchen omeprazole (PRILOSEC) 20 MG capsule Take 20 mg by mouth 2 (two) times daily as needed. HEART BURN     . REVLIMID 10 MG capsule Take 10 mg by mouth daily.    Marland Kitchen warfarin (COUMADIN) 5 MG tablet Take as directed by anticoagulation clinic (Patient taking differently: Take 5 mg by mouth every evening. ) 35 tablet 11   No current facility-administered medications for this visit.   Facility-Administered Medications Ordered in Other Visits  Medication Dose Route Frequency Provider Last Rate Last Dose  . 0.9 %  sodium chloride infusion   Intravenous Continuous Alfred Levins, MD   Stopped at 05/27/14 0950    PHYSICAL EXAMINATION: ECOG PERFORMANCE STATUS: 1 - Symptomatic but completely ambulatory  Filed Vitals:   05/27/14 0836  BP: 122/72  Pulse: 64  Temp: 98.3 F (36.8 C)  Resp: 18   Filed Weights   05/27/14 0836  Weight: 219 lb 1.6 oz (99.383 kg)    GENERAL:alert, no distress and comfortable SKIN: skin color, texture, turgor are normal, no rashes or significant lesions EYES: normal, Conjunctiva are pink and non-injected, sclera clear OROPHARYNX:no exudate, no erythema and lips, buccal mucosa, and tongue normal  NECK:  supple, thyroid normal size, non-tender, without nodularity LYMPH:  no palpable lymphadenopathy in the cervical, axillary or inguinal LUNGS: clear to auscultation and percussion with normal breathing effort HEART: regular rate & rhythm and no murmurs and no lower extremity edema ABDOMEN:abdomen soft, non-tender and normal bowel sounds Musculoskeletal:no cyanosis of digits and no clubbing  NEURO: alert & oriented x 3 with fluent speech, no focal motor/sensory deficits  LABORATORY DATA:  I have reviewed the data as listed    Component Value Date/Time   NA 140 11/18/2013 0756   NA 136 04/06/2012 1950   K 4.0 11/18/2013 0756   K 3.9 04/06/2012 1950   CL 112* 08/27/2012 1309   CL 103 04/06/2012 1950   CO2 20* 11/18/2013 0756   CO2 22 04/06/2012 1950   GLUCOSE 88 11/18/2013 0756   GLUCOSE 100* 08/27/2012 1309   GLUCOSE 110* 04/06/2012 1950   BUN 18.0 11/18/2013 0756   BUN 19 04/06/2012 1950   CREATININE 1.3 11/18/2013 0756   CREATININE 1.25 04/06/2012 1950   CREATININE 0.91 01/01/2011 1630   CALCIUM 8.7 11/18/2013 0756   CALCIUM 8.4 04/06/2012 1950   PROT 7.0 11/18/2013 0756   PROT 6.5 12/24/2011 1643   ALBUMIN 3.4* 11/18/2013 0756   ALBUMIN 4.0 12/24/2011 1643   AST 23 11/18/2013 0756   AST 21 12/24/2011 1643   ALT 33 11/18/2013 0756   ALT 39 12/24/2011 1643   ALKPHOS 83 11/18/2013 0756   ALKPHOS 119* 12/24/2011 1643   BILITOT 0.59 11/18/2013 0756   BILITOT 0.3 12/24/2011 1643   GFRNONAA 57* 04/06/2012 1950   GFRAA 66* 04/06/2012 1950    No results found for: SPEP, UPEP  Lab Results  Component Value Date   WBC 5.9 11/18/2013   NEUTROABS 3.4 11/18/2013   HGB 14.4 11/18/2013   HCT 43.9 11/18/2013   MCV 95.0 11/18/2013   PLT 181 11/18/2013      Chemistry      Component Value Date/Time   NA 140 11/18/2013 0756   NA 136 04/06/2012 1950   K 4.0 11/18/2013 0756   K 3.9 04/06/2012 1950   CL 112* 08/27/2012 1309   CL 103 04/06/2012 1950   CO2 20* 11/18/2013 0756    CO2 22 04/06/2012 1950   BUN 18.0 11/18/2013 0756   BUN 19 04/06/2012 1950   CREATININE 1.3 11/18/2013 0756   CREATININE 1.25 04/06/2012 1950   CREATININE 0.91 01/01/2011 1630      Component Value Date/Time   CALCIUM 8.7 11/18/2013 0756   CALCIUM 8.4 04/06/2012 1950   ALKPHOS 83 11/18/2013 0756   ALKPHOS 119* 12/24/2011 1643   AST 23 11/18/2013 0756   AST 21 12/24/2011 1643   ALT 33 11/18/2013 0756   ALT 39 12/24/2011 1643   BILITOT 0.59 11/18/2013 0756   BILITOT 0.3 12/24/2011 1643      ASSESSMENT & PLAN:  Multiple myeloma He is doing very well on maintenance chemotherapy. His most recent serum protein electrophoresis and immunofixation performed at [facility] showed complete remission. I will continue Zometa every 3 months. The patient had dental clearance but the dentist recently. He will continue taking calcium with vitamin D supplement.    Pulmonary embolism  He will continue on Coumadin. The patient denies any recent signs or symptoms of bleeding such as spontaneous epistaxis, hematuria or hematochezia.    Back pain, lumbosacral He has significant degenerative arthritis. He will continue taking calcium, vitamin D and krill oil for relief.    No orders of the defined types were placed in this encounter.   All questions were answered. The patient knows to call the clinic with any problems, questions or concerns. No barriers to learning was detected. I spent 15 minutes counseling the patient face to face. The total time spent in the appointment was 20 minutes and more than 50% was on counseling and review of test results     [facility], Briarrose Shor, MD 05/27/2014 10:27 AM

## 2014-05-27 NOTE — Assessment & Plan Note (Signed)
He has significant degenerative arthritis. He will continue taking calcium, vitamin D and krill oil for relief.

## 2014-05-27 NOTE — Assessment & Plan Note (Signed)
He will continue on Coumadin. The patient denies any recent signs or symptoms of bleeding such as spontaneous epistaxis, hematuria or hematochezia.

## 2014-06-03 ENCOUNTER — Ambulatory Visit (INDEPENDENT_AMBULATORY_CARE_PROVIDER_SITE_OTHER): Payer: TRICARE For Life (TFL) | Admitting: General Practice

## 2014-06-03 DIAGNOSIS — Z5181 Encounter for therapeutic drug level monitoring: Secondary | ICD-10-CM

## 2014-06-03 DIAGNOSIS — I2699 Other pulmonary embolism without acute cor pulmonale: Secondary | ICD-10-CM | POA: Diagnosis not present

## 2014-06-03 DIAGNOSIS — Z86718 Personal history of other venous thrombosis and embolism: Secondary | ICD-10-CM

## 2014-06-03 DIAGNOSIS — D689 Coagulation defect, unspecified: Secondary | ICD-10-CM | POA: Diagnosis not present

## 2014-06-03 LAB — POCT INR: INR: 2.5

## 2014-06-03 NOTE — Progress Notes (Signed)
Pre visit review using our clinic review tool, if applicable. No additional management support is needed unless otherwise documented below in the visit note. 

## 2014-07-01 ENCOUNTER — Ambulatory Visit (INDEPENDENT_AMBULATORY_CARE_PROVIDER_SITE_OTHER): Payer: Medicare Other | Admitting: General Practice

## 2014-07-01 DIAGNOSIS — Z5181 Encounter for therapeutic drug level monitoring: Secondary | ICD-10-CM

## 2014-07-01 DIAGNOSIS — D689 Coagulation defect, unspecified: Secondary | ICD-10-CM | POA: Diagnosis not present

## 2014-07-01 DIAGNOSIS — Z86718 Personal history of other venous thrombosis and embolism: Secondary | ICD-10-CM

## 2014-07-01 DIAGNOSIS — I2699 Other pulmonary embolism without acute cor pulmonale: Secondary | ICD-10-CM

## 2014-07-01 LAB — POCT INR: INR: 2.4

## 2014-07-01 NOTE — Progress Notes (Signed)
Pre visit review using our clinic review tool, if applicable. No additional management support is needed unless otherwise documented below in the visit note. 

## 2014-08-06 DIAGNOSIS — H04123 Dry eye syndrome of bilateral lacrimal glands: Secondary | ICD-10-CM | POA: Diagnosis not present

## 2014-08-06 DIAGNOSIS — H01001 Unspecified blepharitis right upper eyelid: Secondary | ICD-10-CM | POA: Diagnosis not present

## 2014-08-06 DIAGNOSIS — H26492 Other secondary cataract, left eye: Secondary | ICD-10-CM | POA: Diagnosis not present

## 2014-08-06 DIAGNOSIS — H43813 Vitreous degeneration, bilateral: Secondary | ICD-10-CM | POA: Diagnosis not present

## 2014-08-09 ENCOUNTER — Other Ambulatory Visit: Payer: Self-pay | Admitting: Family Medicine

## 2014-08-09 ENCOUNTER — Other Ambulatory Visit: Payer: Self-pay | Admitting: *Deleted

## 2014-08-09 DIAGNOSIS — Z86718 Personal history of other venous thrombosis and embolism: Secondary | ICD-10-CM

## 2014-08-09 MED ORDER — WARFARIN SODIUM 5 MG PO TABS: ORAL_TABLET | ORAL | Status: DC

## 2014-08-10 DIAGNOSIS — C9000 Multiple myeloma not having achieved remission: Secondary | ICD-10-CM | POA: Diagnosis not present

## 2014-08-12 ENCOUNTER — Ambulatory Visit (INDEPENDENT_AMBULATORY_CARE_PROVIDER_SITE_OTHER): Payer: Medicare Other | Admitting: General Practice

## 2014-08-12 DIAGNOSIS — Z86718 Personal history of other venous thrombosis and embolism: Secondary | ICD-10-CM

## 2014-08-12 DIAGNOSIS — I2699 Other pulmonary embolism without acute cor pulmonale: Secondary | ICD-10-CM | POA: Diagnosis not present

## 2014-08-12 DIAGNOSIS — D689 Coagulation defect, unspecified: Secondary | ICD-10-CM

## 2014-08-12 DIAGNOSIS — Z5181 Encounter for therapeutic drug level monitoring: Secondary | ICD-10-CM | POA: Diagnosis not present

## 2014-08-12 LAB — POCT INR: INR: 2.1

## 2014-08-12 NOTE — Progress Notes (Signed)
Pre visit review using our clinic review tool, if applicable. No additional management support is needed unless otherwise documented below in the visit note. 

## 2014-08-30 ENCOUNTER — Other Ambulatory Visit: Payer: Self-pay | Admitting: Hematology and Oncology

## 2014-08-30 ENCOUNTER — Telehealth: Payer: Self-pay | Admitting: Hematology and Oncology

## 2014-08-30 ENCOUNTER — Ambulatory Visit (HOSPITAL_BASED_OUTPATIENT_CLINIC_OR_DEPARTMENT_OTHER): Payer: Medicare Other

## 2014-08-30 ENCOUNTER — Encounter: Payer: Self-pay | Admitting: Hematology and Oncology

## 2014-08-30 ENCOUNTER — Ambulatory Visit (HOSPITAL_BASED_OUTPATIENT_CLINIC_OR_DEPARTMENT_OTHER): Payer: Medicare Other | Admitting: Hematology and Oncology

## 2014-08-30 VITALS — BP 118/67 | HR 56 | Temp 97.7°F | Resp 18 | Ht 70.0 in | Wt 211.5 lb

## 2014-08-30 DIAGNOSIS — C9000 Multiple myeloma not having achieved remission: Secondary | ICD-10-CM

## 2014-08-30 DIAGNOSIS — Z86711 Personal history of pulmonary embolism: Secondary | ICD-10-CM

## 2014-08-30 DIAGNOSIS — I2699 Other pulmonary embolism without acute cor pulmonale: Secondary | ICD-10-CM

## 2014-08-30 MED ORDER — ZOLEDRONIC ACID 4 MG/100ML IV SOLN
4.0000 mg | Freq: Once | INTRAVENOUS | Status: AC
  Administered 2014-08-30: 4 mg via INTRAVENOUS
  Filled 2014-08-30: qty 100

## 2014-08-30 NOTE — Patient Instructions (Signed)

## 2014-08-30 NOTE — Progress Notes (Signed)
[place] OFFICE PROGRESS NOTE  Patient Care Team: Lucretia Kern, DO as PCP - General (Family Medicine) Terrilyn Saver, MD as Referring Physician (Internal Medicine) Heath Lark, MD as Consulting Physician (Hematology and Oncology)  SUMMARY OF ONCOLOGIC HISTORY:  The patient was initially diagnosed in 2012. He had chemotherapy followed by autologous stem cell transplant [place] around October 2013. In April 2014 he was started on maintenance Revlimid 10 mg daily by mouth along with monthly Zometa. Starting on 05/18/2013, his Zometa is change to every 3 months.  INTERVAL HISTORY: Please see below for problem oriented charting. He continues have mild chronic diffuse bone pain but does not bother him. He continue taking Revlimid without side effects. Denies recent infection. He remains on anticoagulation therapy. The patient denies any recent signs or symptoms of bleeding such as spontaneous epistaxis, hematuria or hematochezia.  REVIEW OF SYSTEMS:   Constitutional: Denies fevers, chills or abnormal weight loss Eyes: Denies blurriness of vision Ears, nose, mouth, throat, and face: Denies mucositis or sore throat Respiratory: Denies cough, dyspnea or wheezes Cardiovascular: Denies palpitation, chest discomfort or lower extremity swelling Gastrointestinal:  Denies nausea, heartburn or change in bowel habits Skin: Denies abnormal skin rashes Lymphatics: Denies new lymphadenopathy or easy bruising Neurological:Denies numbness, tingling or new weaknesses Behavioral/Psych: Mood is stable, no new changes  All other systems were reviewed with the patient and are negative.  I have reviewed the past medical history, past surgical history, social history and family history with the patient and they are unchanged from previous note.  ALLERGIES:  is allergic to blood-group specific substance.  MEDICATIONS:  Current Outpatient Prescriptions   Medication Sig Dispense Refill  . calcium carbonate (TUMS - DOSED IN MG ELEMENTAL CALCIUM) 500 MG chewable tablet Chew 1 tablet by mouth every 2 (two) hours as needed. HEART BURN      . Cholecalciferol (VITAMIN D) 1000 UNITS capsule Take 5,000 Units by mouth daily.    . cyclobenzaprine (FLEXERIL) 10 MG tablet Take 1 tablet (10 mg total) by mouth 3 (three) times daily as needed for muscle spasms. 30 tablet 3  . Glucosamine-Chondroitin (GLUCOSAMINE CHONDR COMPLEX PO) Take 1 tablet by mouth 2 (two) times daily.     Marland Kitchen KRILL OIL PO Take 1 tablet by mouth 2 (two) times daily.     Marland Kitchen levothyroxine (SYNTHROID, LEVOTHROID) 100 MCG tablet TAKE ONE TABLET BY MOUTH ONCE DAILY 30 tablet 0  . methylcellulose (ARTIFICIAL TEARS) 1 % ophthalmic solution Place 2 drops into both eyes 4 (four) times daily as needed (dry eyes).     Marland Kitchen omeprazole (PRILOSEC) 20 MG capsule Take 20 mg by mouth 2 (two) times daily as needed. HEART BURN     . REVLIMID 10 MG capsule Take 10 mg by mouth daily.    Marland Kitchen warfarin (COUMADIN) 5 MG tablet Take as directed by anticoagulation clinic 30 tablet 11   No current facility-administered medications for this visit.    PHYSICAL EXAMINATION: ECOG PERFORMANCE STATUS: 0 - Asymptomatic  Filed Vitals:   08/30/14 0820  BP: 118/67  Pulse: 56  Temp: 97.7 F (36.5 C)  Resp: 18   Filed Weights   08/30/14 0820  Weight: 211 lb 8 oz (95.936 kg)    GENERAL:alert, no distress and comfortable SKIN: skin color, texture, turgor are normal, no rashes or significant lesions EYES: normal, Conjunctiva are pink and non-injected, sclera clear OROPHARYNX:no exudate, no erythema and lips, buccal mucosa, and tongue normal  NECK:  supple, thyroid normal size, non-tender, without nodularity LYMPH:  no palpable lymphadenopathy in the cervical, axillary or inguinal LUNGS: clear to auscultation and percussion with normal breathing effort HEART: regular rate & rhythm and no murmurs and no lower extremity  edema ABDOMEN:abdomen soft, non-tender and normal bowel sounds Musculoskeletal:no cyanosis of digits and no clubbing  NEURO: alert & oriented x 3 with fluent speech, no focal motor/sensory deficits  LABORATORY DATA:  I have reviewed the data as listed    Component Value Date/Time   NA 140 11/18/2013 0756   NA 136 04/06/2012 1950   K 4.0 11/18/2013 0756   K 3.9 04/06/2012 1950   CL 112* 08/27/2012 1309   CL 103 04/06/2012 1950   CO2 20* 11/18/2013 0756   CO2 22 04/06/2012 1950   GLUCOSE 88 11/18/2013 0756   GLUCOSE 100* 08/27/2012 1309   GLUCOSE 110* 04/06/2012 1950   BUN 18.0 11/18/2013 0756   BUN 19 04/06/2012 1950   CREATININE 1.3 11/18/2013 0756   CREATININE 1.25 04/06/2012 1950   CREATININE 0.91 01/01/2011 1630   CALCIUM 8.7 11/18/2013 0756   CALCIUM 8.4 04/06/2012 1950   PROT 7.0 11/18/2013 0756   PROT 6.5 12/24/2011 1643   ALBUMIN 3.4* 11/18/2013 0756   ALBUMIN 4.0 12/24/2011 1643   AST 23 11/18/2013 0756   AST 21 12/24/2011 1643   ALT 33 11/18/2013 0756   ALT 39 12/24/2011 1643   ALKPHOS 83 11/18/2013 0756   ALKPHOS 119* 12/24/2011 1643   BILITOT 0.59 11/18/2013 0756   BILITOT 0.3 12/24/2011 1643   GFRNONAA 57* 04/06/2012 1950   GFRAA 66* 04/06/2012 1950    No results found for: SPEP, UPEP  Lab Results  Component Value Date   WBC 5.9 11/18/2013   NEUTROABS 3.4 11/18/2013   HGB 14.4 11/18/2013   HCT 43.9 11/18/2013   MCV 95.0 11/18/2013   PLT 181 11/18/2013      Chemistry      Component Value Date/Time   NA 140 11/18/2013 0756   NA 136 04/06/2012 1950   K 4.0 11/18/2013 0756   K 3.9 04/06/2012 1950   CL 112* 08/27/2012 1309   CL 103 04/06/2012 1950   CO2 20* 11/18/2013 0756   CO2 22 04/06/2012 1950   BUN 18.0 11/18/2013 0756   BUN 19 04/06/2012 1950   CREATININE 1.3 11/18/2013 0756   CREATININE 1.25 04/06/2012 1950   CREATININE 0.91 01/01/2011 1630      Component Value Date/Time   CALCIUM 8.7 11/18/2013 0756   CALCIUM 8.4 04/06/2012 1950    ALKPHOS 83 11/18/2013 0756   ALKPHOS 119* 12/24/2011 1643   AST 23 11/18/2013 0756   AST 21 12/24/2011 1643   ALT 33 11/18/2013 0756   ALT 39 12/24/2011 1643   BILITOT 0.59 11/18/2013 0756   BILITOT 0.3 12/24/2011 1643      ASSESSMENT & PLAN:  Multiple myeloma He is doing very well on maintenance chemotherapy. His most recent serum protein electrophoresis and immunofixation performed at [facility] showed complete remission. I will continue Zometa every 3 months. The patient had dental clearance but the dentist recently. He will continue taking calcium with vitamin D supplement.     Pulmonary embolism He will continue on Coumadin. The patient denies any recent signs or symptoms of bleeding such as spontaneous epistaxis, hematuria or hematochezia.     No orders of the defined types were placed in this encounter.   All questions were answered. The patient knows to call  the clinic with any problems, questions or concerns. No barriers to learning was detected. I spent 15 minutes counseling the patient face to face. The total time spent in the appointment was 20 minutes and more than 50% was on counseling and review of test results     [facility], [place], MD 08/30/2014 2:55 PM

## 2014-08-30 NOTE — Assessment & Plan Note (Signed)
He will continue on Coumadin. The patient denies any recent signs or symptoms of bleeding such as spontaneous epistaxis, hematuria or hematochezia.

## 2014-08-30 NOTE — Assessment & Plan Note (Signed)
He is doing very well on maintenance chemotherapy. His most recent serum protein electrophoresis and immunofixation performed at [facility] showed complete remission. I will continue Zometa every 3 months. The patient had dental clearance but the dentist recently. He will continue taking calcium with vitamin D supplement.

## 2014-08-30 NOTE — Telephone Encounter (Signed)
Gave and printed appt sched and avs for pt for SEpt °

## 2014-09-15 ENCOUNTER — Other Ambulatory Visit: Payer: Self-pay | Admitting: Family Medicine

## 2014-09-23 ENCOUNTER — Ambulatory Visit (INDEPENDENT_AMBULATORY_CARE_PROVIDER_SITE_OTHER): Payer: Medicare Other | Admitting: General Practice

## 2014-09-23 DIAGNOSIS — I2699 Other pulmonary embolism without acute cor pulmonale: Secondary | ICD-10-CM | POA: Diagnosis not present

## 2014-09-23 DIAGNOSIS — Z5181 Encounter for therapeutic drug level monitoring: Secondary | ICD-10-CM

## 2014-09-23 LAB — POCT INR: INR: 2.4

## 2014-09-23 NOTE — Progress Notes (Signed)
Pre visit review using our clinic review tool, if applicable. No additional management support is needed unless otherwise documented below in the visit note. 

## 2014-10-15 ENCOUNTER — Other Ambulatory Visit: Payer: Self-pay | Admitting: Family Medicine

## 2014-11-04 ENCOUNTER — Ambulatory Visit (INDEPENDENT_AMBULATORY_CARE_PROVIDER_SITE_OTHER): Payer: Medicare Other | Admitting: General Practice

## 2014-11-04 DIAGNOSIS — Z5181 Encounter for therapeutic drug level monitoring: Secondary | ICD-10-CM

## 2014-11-04 DIAGNOSIS — Z86718 Personal history of other venous thrombosis and embolism: Secondary | ICD-10-CM

## 2014-11-04 DIAGNOSIS — D689 Coagulation defect, unspecified: Secondary | ICD-10-CM | POA: Diagnosis not present

## 2014-11-04 DIAGNOSIS — I2699 Other pulmonary embolism without acute cor pulmonale: Secondary | ICD-10-CM

## 2014-11-04 LAB — POCT INR: INR: 2.6

## 2014-11-04 NOTE — Progress Notes (Signed)
Pre visit review using our clinic review tool, if applicable. No additional management support is needed unless otherwise documented below in the visit note. 

## 2014-11-10 DIAGNOSIS — H01004 Unspecified blepharitis left upper eyelid: Secondary | ICD-10-CM | POA: Diagnosis not present

## 2014-11-10 DIAGNOSIS — H01001 Unspecified blepharitis right upper eyelid: Secondary | ICD-10-CM | POA: Diagnosis not present

## 2014-11-10 DIAGNOSIS — H02052 Trichiasis without entropian right lower eyelid: Secondary | ICD-10-CM | POA: Diagnosis not present

## 2014-11-10 DIAGNOSIS — H04123 Dry eye syndrome of bilateral lacrimal glands: Secondary | ICD-10-CM | POA: Diagnosis not present

## 2014-11-16 ENCOUNTER — Other Ambulatory Visit: Payer: Self-pay | Admitting: Family Medicine

## 2014-11-17 DIAGNOSIS — C9000 Multiple myeloma not having achieved remission: Secondary | ICD-10-CM | POA: Diagnosis not present

## 2014-11-17 DIAGNOSIS — M898X9 Other specified disorders of bone, unspecified site: Secondary | ICD-10-CM | POA: Diagnosis not present

## 2014-11-29 ENCOUNTER — Ambulatory Visit: Payer: TRICARE For Life (TFL) | Admitting: Hematology and Oncology

## 2014-11-29 ENCOUNTER — Telehealth: Payer: Self-pay | Admitting: *Deleted

## 2014-11-29 ENCOUNTER — Ambulatory Visit: Payer: TRICARE For Life (TFL)

## 2014-11-29 NOTE — Telephone Encounter (Signed)
Patient needed to change the appts from today. Desk RN and MD notified.   JMW

## 2014-11-30 ENCOUNTER — Ambulatory Visit: Payer: Medicare Other | Admitting: Hematology and Oncology

## 2014-11-30 ENCOUNTER — Ambulatory Visit: Payer: Medicare Other

## 2014-12-02 ENCOUNTER — Ambulatory Visit: Payer: Medicare Other

## 2014-12-02 ENCOUNTER — Ambulatory Visit: Payer: Medicare Other | Admitting: Hematology and Oncology

## 2014-12-06 ENCOUNTER — Telehealth: Payer: Self-pay | Admitting: Hematology and Oncology

## 2014-12-06 NOTE — Telephone Encounter (Signed)
pt wife called to r/s appt...done....pt ok and aware of new d.t

## 2014-12-07 DIAGNOSIS — C9000 Multiple myeloma not having achieved remission: Secondary | ICD-10-CM | POA: Diagnosis not present

## 2014-12-07 DIAGNOSIS — Z23 Encounter for immunization: Secondary | ICD-10-CM | POA: Diagnosis not present

## 2014-12-07 DIAGNOSIS — M7061 Trochanteric bursitis, right hip: Secondary | ICD-10-CM | POA: Diagnosis not present

## 2014-12-07 DIAGNOSIS — M25552 Pain in left hip: Secondary | ICD-10-CM | POA: Diagnosis not present

## 2014-12-07 DIAGNOSIS — M7062 Trochanteric bursitis, left hip: Secondary | ICD-10-CM | POA: Diagnosis not present

## 2014-12-07 DIAGNOSIS — M160 Bilateral primary osteoarthritis of hip: Secondary | ICD-10-CM | POA: Diagnosis not present

## 2014-12-13 ENCOUNTER — Ambulatory Visit: Payer: Medicare Other | Admitting: Hematology and Oncology

## 2014-12-13 ENCOUNTER — Ambulatory Visit: Payer: Medicare Other

## 2014-12-14 ENCOUNTER — Ambulatory Visit (HOSPITAL_BASED_OUTPATIENT_CLINIC_OR_DEPARTMENT_OTHER): Payer: Medicare Other | Admitting: Hematology and Oncology

## 2014-12-14 ENCOUNTER — Encounter: Payer: Self-pay | Admitting: Hematology and Oncology

## 2014-12-14 ENCOUNTER — Ambulatory Visit (HOSPITAL_BASED_OUTPATIENT_CLINIC_OR_DEPARTMENT_OTHER): Payer: Medicare Other

## 2014-12-14 ENCOUNTER — Telehealth: Payer: Self-pay | Admitting: Hematology and Oncology

## 2014-12-14 VITALS — BP 121/67 | HR 54 | Temp 97.5°F | Resp 20 | Ht 70.0 in | Wt 205.7 lb

## 2014-12-14 DIAGNOSIS — M1990 Unspecified osteoarthritis, unspecified site: Secondary | ICD-10-CM | POA: Diagnosis not present

## 2014-12-14 DIAGNOSIS — C9000 Multiple myeloma not having achieved remission: Secondary | ICD-10-CM | POA: Diagnosis not present

## 2014-12-14 DIAGNOSIS — M899 Disorder of bone, unspecified: Secondary | ICD-10-CM

## 2014-12-14 DIAGNOSIS — M898X9 Other specified disorders of bone, unspecified site: Secondary | ICD-10-CM

## 2014-12-14 DIAGNOSIS — C9001 Multiple myeloma in remission: Secondary | ICD-10-CM | POA: Diagnosis not present

## 2014-12-14 DIAGNOSIS — I2699 Other pulmonary embolism without acute cor pulmonale: Secondary | ICD-10-CM | POA: Diagnosis not present

## 2014-12-14 MED ORDER — ZOLEDRONIC ACID 4 MG/100ML IV SOLN
4.0000 mg | Freq: Once | INTRAVENOUS | Status: AC
  Administered 2014-12-14: 4 mg via INTRAVENOUS
  Filled 2014-12-14: qty 100

## 2014-12-14 MED ORDER — SODIUM CHLORIDE 0.9 % IV SOLN
INTRAVENOUS | Status: DC
  Administered 2014-12-14: 14:00:00 via INTRAVENOUS

## 2014-12-14 MED ORDER — HEPARIN SOD (PORK) LOCK FLUSH 100 UNIT/ML IV SOLN
500.0000 [IU] | Freq: Once | INTRAVENOUS | Status: DC
  Filled 2014-12-14: qty 5

## 2014-12-14 MED ORDER — SODIUM CHLORIDE 0.9 % IJ SOLN
10.0000 mL | Freq: Once | INTRAMUSCULAR | Status: DC
  Filled 2014-12-14: qty 10

## 2014-12-14 NOTE — Assessment & Plan Note (Signed)
He has chronic arthritis pain that is not new. Continue conservative management with vitamin D supplement

## 2014-12-14 NOTE — Assessment & Plan Note (Signed)
He will continue on Coumadin. The patient denies any recent signs or symptoms of bleeding such as spontaneous epistaxis, hematuria or hematochezia.   

## 2014-12-14 NOTE — Patient Instructions (Signed)

## 2014-12-14 NOTE — Telephone Encounter (Signed)
Gave adn printed appt sched and avs fo rpt for Jan 2017 °

## 2014-12-14 NOTE — Progress Notes (Signed)
[place] OFFICE PROGRESS NOTE  Patient Care Team: Lucretia Kern, DO as PCP - General (Family Medicine) Terrilyn Saver, MD as Referring Physician (Internal Medicine) Heath Lark, MD as Consulting Physician (Hematology and Oncology)  SUMMARY OF ONCOLOGIC HISTORY:  The patient was initially diagnosed in 2012. He had chemotherapy followed by autologous stem cell transplant [place] around October 2013. In April 2014 he was started on maintenance Revlimid 10 mg daily by mouth along with monthly Zometa. Starting on 05/18/2013, his Zometa is change to every 3 months.  INTERVAL HISTORY: Please see below for problem oriented charting. He feels well. He has some intentional weight loss. History recent acute bursitis from work. Denies other new bone pain. No recent infection.  REVIEW OF SYSTEMS:   Constitutional: Denies fevers, chills or abnormal weight loss Eyes: Denies blurriness of vision Ears, nose, mouth, throat, and face: Denies mucositis or sore throat Respiratory: Denies cough, dyspnea or wheezes Cardiovascular: Denies palpitation, chest discomfort or lower extremity swelling Gastrointestinal:  Denies nausea, heartburn or change in bowel habits Skin: Denies abnormal skin rashes Lymphatics: Denies new lymphadenopathy or easy bruising Neurological:Denies numbness, tingling or new weaknesses Behavioral/Psych: Mood is stable, no new changes  All other systems were reviewed with the patient and are negative.  I have reviewed the past medical history, past surgical history, social history and family history with the patient and they are unchanged from previous note.  ALLERGIES:  is allergic to blood-group specific substance.  MEDICATIONS:  Current Outpatient Prescriptions  Medication Sig Dispense Refill  . calcium carbonate (TUMS - DOSED IN MG ELEMENTAL CALCIUM) 500 MG chewable tablet Chew 1 tablet by mouth every 2 (two) hours as needed.  HEART BURN      . Cholecalciferol (VITAMIN D) 1000 UNITS capsule Take 5,000 Units by mouth daily.    . cyclobenzaprine (FLEXERIL) 10 MG tablet Take 1 tablet (10 mg total) by mouth 3 (three) times daily as needed for muscle spasms. 30 tablet 3  . Glucosamine-Chondroitin (GLUCOSAMINE CHONDR COMPLEX PO) Take 1 tablet by mouth 2 (two) times daily.     Marland Kitchen KRILL OIL PO Take 1 tablet by mouth 2 (two) times daily.     Marland Kitchen levothyroxine (SYNTHROID, LEVOTHROID) 100 MCG tablet TAKE ONE TABLET BY MOUTH ONCE DAILY 30 tablet 0  . methylcellulose (ARTIFICIAL TEARS) 1 % ophthalmic solution Place 2 drops into both eyes 4 (four) times daily as needed (dry eyes).     Marland Kitchen omeprazole (PRILOSEC) 20 MG capsule Take 20 mg by mouth 2 (two) times daily as needed. HEART BURN     . REVLIMID 10 MG capsule Take 10 mg by mouth daily.    Marland Kitchen warfarin (COUMADIN) 5 MG tablet Take as directed by anticoagulation clinic 30 tablet 11   No current facility-administered medications for this visit.   Facility-Administered Medications Ordered in Other Visits  Medication Dose Route Frequency Provider Last Rate Last Dose  . 0.9 %  sodium chloride infusion   Intravenous Continuous Heath Lark, MD 20 mL/hr at 12/14/14 1355    . Zoledronic Acid (ZOMETA) 4 mg IVPB  4 mg Intravenous Once Heath Lark, MD        PHYSICAL EXAMINATION: ECOG PERFORMANCE STATUS: 1 - Symptomatic but completely ambulatory  Filed Vitals:   12/14/14 1337  BP: 121/67  Pulse: 54  Temp: 97.5 F (36.4 C)  Resp: 20   Filed Weights   12/14/14 1337  Weight: 205 lb 11.2 oz (93.305 kg)  GENERAL:alert, no distress and comfortable SKIN: skin color, texture, turgor are normal, no rashes or significant lesions EYES: normal, Conjunctiva are pink and non-injected, sclera clear OROPHARYNX:no exudate, no erythema and lips, buccal mucosa, and tongue normal  NECK: supple, thyroid normal size, non-tender, without nodularity LYMPH:  no palpable lymphadenopathy in the cervical,  axillary or inguinal LUNGS: clear to auscultation and percussion with normal breathing effort HEART: regular rate & rhythm and no murmurs and no lower extremity edema ABDOMEN:abdomen soft, non-tender and normal bowel sounds Musculoskeletal:no cyanosis of digits and no clubbing  NEURO: alert & oriented x 3 with fluent speech, no focal motor/sensory deficits  LABORATORY DATA:  I have reviewed the data as listed    Component Value Date/Time   NA 140 11/18/2013 0756   NA 136 04/06/2012 1950   K 4.0 11/18/2013 0756   K 3.9 04/06/2012 1950   CL 112* 08/27/2012 1309   CL 103 04/06/2012 1950   CO2 20* 11/18/2013 0756   CO2 22 04/06/2012 1950   GLUCOSE 88 11/18/2013 0756   GLUCOSE 100* 08/27/2012 1309   GLUCOSE 110* 04/06/2012 1950   BUN 18.0 11/18/2013 0756   BUN 19 04/06/2012 1950   CREATININE 1.3 11/18/2013 0756   CREATININE 1.25 04/06/2012 1950   CREATININE 0.91 01/01/2011 1630   CALCIUM 8.7 11/18/2013 0756   CALCIUM 8.4 04/06/2012 1950   PROT 7.0 11/18/2013 0756   PROT 6.5 12/24/2011 1643   ALBUMIN 3.4* 11/18/2013 0756   ALBUMIN 4.0 12/24/2011 1643   AST 23 11/18/2013 0756   AST 21 12/24/2011 1643   ALT 33 11/18/2013 0756   ALT 39 12/24/2011 1643   ALKPHOS 83 11/18/2013 0756   ALKPHOS 119* 12/24/2011 1643   BILITOT 0.59 11/18/2013 0756   BILITOT 0.3 12/24/2011 1643   GFRNONAA 57* 04/06/2012 1950   GFRAA 66* 04/06/2012 1950    No results found for: SPEP, UPEP  Lab Results  Component Value Date   WBC 5.9 11/18/2013   NEUTROABS 3.4 11/18/2013   HGB 14.4 11/18/2013   HCT 43.9 11/18/2013   MCV 95.0 11/18/2013   PLT 181 11/18/2013      Chemistry      Component Value Date/Time   NA 140 11/18/2013 0756   NA 136 04/06/2012 1950   K 4.0 11/18/2013 0756   K 3.9 04/06/2012 1950   CL 112* 08/27/2012 1309   CL 103 04/06/2012 1950   CO2 20* 11/18/2013 0756   CO2 22 04/06/2012 1950   BUN 18.0 11/18/2013 0756   BUN 19 04/06/2012 1950   CREATININE 1.3 11/18/2013 0756    CREATININE 1.25 04/06/2012 1950   CREATININE 0.91 01/01/2011 1630      Component Value Date/Time   CALCIUM 8.7 11/18/2013 0756   CALCIUM 8.4 04/06/2012 1950   ALKPHOS 83 11/18/2013 0756   ALKPHOS 119* 12/24/2011 1643   AST 23 11/18/2013 0756   AST 21 12/24/2011 1643   ALT 33 11/18/2013 0756   ALT 39 12/24/2011 1643   BILITOT 0.59 11/18/2013 0756   BILITOT 0.3 12/24/2011 1643     ASSESSMENT & PLAN:  Multiple myeloma in remission ([place]) He is doing very well on maintenance chemotherapy. His most recent serum protein electrophoresis and immunofixation performed at [facility] showed complete remission. I will continue Zometa every 3 months. The patient had dental clearance and has seen his dentist recently. He will continue taking calcium with vitamin D supplement.    Pulmonary embolism He will continue on Coumadin. The  patient denies any recent signs or symptoms of bleeding such as spontaneous epistaxis, hematuria or hematochezia.      Bone pain He has chronic arthritis pain that is not new. Continue conservative management with vitamin D supplement    No orders of the defined types were placed in this encounter.   All questions were answered. The patient knows to call the clinic with any problems, questions or concerns. No barriers to learning was detected. I spent 15 minutes counseling the patient face to face. The total time spent in the appointment was 20 minutes and more than 50% was on counseling and review of test results     [facility], [place], MD 12/14/2014 2:01 PM

## 2014-12-14 NOTE — Assessment & Plan Note (Signed)
He is doing very well on maintenance chemotherapy. His most recent serum protein electrophoresis and immunofixation performed at [facility] showed complete remission. I will continue Zometa every 3 months. The patient had dental clearance and has seen his dentist recently. He will continue taking calcium with vitamin D supplement.

## 2014-12-16 ENCOUNTER — Ambulatory Visit (INDEPENDENT_AMBULATORY_CARE_PROVIDER_SITE_OTHER): Payer: Medicare Other | Admitting: General Practice

## 2014-12-16 DIAGNOSIS — Z86718 Personal history of other venous thrombosis and embolism: Secondary | ICD-10-CM | POA: Diagnosis not present

## 2014-12-16 DIAGNOSIS — Z5181 Encounter for therapeutic drug level monitoring: Secondary | ICD-10-CM

## 2014-12-16 DIAGNOSIS — D689 Coagulation defect, unspecified: Secondary | ICD-10-CM | POA: Diagnosis not present

## 2014-12-16 DIAGNOSIS — I2699 Other pulmonary embolism without acute cor pulmonale: Secondary | ICD-10-CM | POA: Diagnosis not present

## 2014-12-16 LAB — POCT INR: INR: 3

## 2014-12-16 NOTE — Progress Notes (Signed)
Pre visit review using our clinic review tool, if applicable. No additional management support is needed unless otherwise documented below in the visit note. 

## 2014-12-17 ENCOUNTER — Telehealth: Payer: Self-pay | Admitting: Hematology and Oncology

## 2014-12-17 NOTE — Telephone Encounter (Signed)
s.w. pt wife adn r/s appt to monday per pt request..Marland KitchenMarland KitchenMarland Kitchenpt ok adn aware

## 2014-12-23 ENCOUNTER — Other Ambulatory Visit: Payer: Self-pay | Admitting: Family Medicine

## 2014-12-29 DIAGNOSIS — M7062 Trochanteric bursitis, left hip: Secondary | ICD-10-CM | POA: Diagnosis not present

## 2014-12-29 DIAGNOSIS — M25552 Pain in left hip: Secondary | ICD-10-CM | POA: Diagnosis not present

## 2014-12-29 DIAGNOSIS — M25852 Other specified joint disorders, left hip: Secondary | ICD-10-CM | POA: Diagnosis not present

## 2014-12-29 DIAGNOSIS — M7061 Trochanteric bursitis, right hip: Secondary | ICD-10-CM | POA: Diagnosis not present

## 2015-01-04 ENCOUNTER — Ambulatory Visit (INDEPENDENT_AMBULATORY_CARE_PROVIDER_SITE_OTHER): Payer: Self-pay | Admitting: Family Medicine

## 2015-01-04 ENCOUNTER — Other Ambulatory Visit: Payer: Medicare Other

## 2015-01-04 ENCOUNTER — Ambulatory Visit: Payer: Medicare Other | Admitting: Hematology and Oncology

## 2015-01-04 DIAGNOSIS — R69 Illness, unspecified: Secondary | ICD-10-CM

## 2015-01-04 NOTE — Progress Notes (Signed)
NO SHOW

## 2015-01-05 ENCOUNTER — Encounter: Payer: Self-pay | Admitting: Family Medicine

## 2015-01-05 ENCOUNTER — Telehealth: Payer: Self-pay | Admitting: General Practice

## 2015-01-05 ENCOUNTER — Ambulatory Visit (INDEPENDENT_AMBULATORY_CARE_PROVIDER_SITE_OTHER): Payer: Medicare Other | Admitting: Family Medicine

## 2015-01-05 VITALS — BP 108/70 | HR 70 | Temp 98.6°F | Ht 70.0 in | Wt 202.0 lb

## 2015-01-05 DIAGNOSIS — J069 Acute upper respiratory infection, unspecified: Secondary | ICD-10-CM

## 2015-01-05 MED ORDER — DOXYCYCLINE HYCLATE 100 MG PO TABS: 100.0000 mg | ORAL_TABLET | Freq: Two times a day (BID) | ORAL | Status: DC

## 2015-01-05 MED ORDER — HYDROCODONE-HOMATROPINE 5-1.5 MG/5ML PO SYRP: 5.0000 mL | ORAL_SOLUTION | Freq: Three times a day (TID) | ORAL | Status: DC | PRN

## 2015-01-05 NOTE — Progress Notes (Signed)
Pre visit review using our clinic review tool, if applicable. No additional management support is needed unless otherwise documented below in the visit note. 

## 2015-01-05 NOTE — Patient Instructions (Signed)
BEFORE YOU LEAVE: -schedule physical in next 3-4 months  Take the antibiotic (doxycycline) as instructed. Most antibiotics (including this medication can interact with your coumadin and cause increased risks for bleeding. Please follow up with your coumadin clinic. We will notify then that you are taking this medication.  Please use caution with the cough medication and do not drive after taking this.

## 2015-01-05 NOTE — Progress Notes (Signed)
HPI:  Rodney [name] is a pleasant 74 yo M with a PMH significant for MM and a Hx of Pulmonary embolus (treated by Dr. Alvy Bimler and at [facility]) here for an acute visit for:  URI: -started: 1-1.5 weeks ago -symptoms:nasal congestion, sore throat, R ear pain cough, thick nasal congestion, low grade fevers, feels is getting worse instead of better -no fever today -denies: SOB, NV, tooth pain -has tried: OTC cough medicine -sick contacts/travel/risks: denies flu exposure, tick exposure or or Ebola risks -Hx of: allergies  ROS: See pertinent positives and negatives per HPI.  Past Medical History  Diagnosis Date  . Pancreatitis   . Pulmonary embolism ([place])   . Complication of anesthesia 02-16-11    Pt. speaks of awakening during the surgery from back  surgery  . Shortness of breath 02-16-11    hx. Pulmonary emboli x2 (9 yrs/ 3'12 -last)  . Anemia 02-16-11    01-05-11- post surgery-Transfusions x 4 units  . Prostate cancer ([place])   . Multiple myeloma 02-16-11    suspected tumor  left sacral  . Degenerative disc disease 02-16-11    01-05-11 L3 fusion done  . Hernia 02-16-11    left inguinal hernia at present  . Bone pain 02/18/2013  . Second degree atrioventricular block 11/26/2012  . Esophageal reflux 05/31/2008    Qualifier: Diagnosis of  By: Arnoldo Morale MD, [place] history of traumatic fracture 07/10/2012  . Hypothyroidism 02/01/2014    Past Surgical History  Procedure Laterality Date  . Back surgery  02-16-11     01-05-11 Lumbar surgery L3(complicated by loss of blood volume)/ 01-09-11 then Lumbar fusion done with retained  hardware   . Cholecystectomy  04/06/2010    laparoscopic-inflammation with stones  . Inguinal hernia repair  02/20/2011    Procedure: HERNIA REPAIR INGUINAL ADULT;  Surgeon: Earnstine Regal, MD;  Location: [facility] ORS;  Service: General;  Laterality: Left;  Repair Left Inguinal Hernia with Mesh  . Hernia repair  02/20/11    Inguinal hernia repair  w/mesh  . Prostate surgery Bilateral 2011    Family History  Problem Relation Age of Onset  . Lymphoma Father 53  . Cancer Father   . Clotting disorder Mother     blood clots  . Cancer Brother     throat ca    Social History   Social History  . Marital Status: Married    Spouse Name: N/A  . Number of Children: N/A  . Years of Education: N/A   Social History Main Topics  . Smoking status: Former Smoker    Quit date: 02/15/1969  . Smokeless tobacco: Never Used     Comment: quit 50 yrs  . Alcohol Use: No  . Drug Use: No  . Sexual Activity: Yes   Other Topics Concern  . None   Social History Narrative   Married   Regular exercise-yes     Current outpatient prescriptions:  .  calcium carbonate (TUMS - DOSED IN MG ELEMENTAL CALCIUM) 500 MG chewable tablet, Chew 1 tablet by mouth every 2 (two) hours as needed. HEART BURN  , Disp: , Rfl:  .  Cholecalciferol (VITAMIN D) 1000 UNITS capsule, Take 5,000 Units by mouth daily., Disp: , Rfl:  .  cyclobenzaprine (FLEXERIL) 10 MG tablet, Take 1 tablet (10 mg total) by mouth 3 (three) times daily as needed for muscle spasms., Disp: 30 tablet, Rfl: 3 .  Glucosamine-Chondroitin (GLUCOSAMINE CHONDR COMPLEX PO), Take  1 tablet by mouth 2 (two) times daily. , Disp: , Rfl:  .  KRILL OIL PO, Take 1 tablet by mouth 2 (two) times daily. , Disp: , Rfl:  .  levothyroxine (SYNTHROID, LEVOTHROID) 100 MCG tablet, TAKE ONE TABLET BY MOUTH ONCE DAILY, Disp: 30 tablet, Rfl: 0 .  methylcellulose (ARTIFICIAL TEARS) 1 % ophthalmic solution, Place 2 drops into both eyes 4 (four) times daily as needed (dry eyes). , Disp: , Rfl:  .  omeprazole (PRILOSEC) 20 MG capsule, Take 20 mg by mouth 2 (two) times daily as needed. HEART BURN , Disp: , Rfl:  .  REVLIMID 10 MG capsule, Take 10 mg by mouth daily., Disp: , Rfl:  .  warfarin (COUMADIN) 5 MG tablet, Take as directed by anticoagulation clinic, Disp: 30 tablet, Rfl: 11 .  doxycycline (VIBRA-TABS) 100 MG  tablet, Take 1 tablet (100 mg total) by mouth 2 (two) times daily., Disp: 20 tablet, Rfl: 0 .  HYDROcodone-homatropine (HYCODAN) 5-1.5 MG/5ML syrup, Take 5 mLs by mouth every 8 (eight) hours as needed for cough., Disp: 120 mL, Rfl: 0  EXAM:  Filed Vitals:   01/05/15 1027  BP: 108/70  Pulse: 70  Temp: 98.6 F (37 C)    Body mass index is 28.98 kg/(m^2).  GENERAL: vitals reviewed and listed above, alert, oriented, appears well hydrated and in no acute distress  HEENT: atraumatic, conjunttiva clear, no obvious abnormalities on inspection of external nose and ears, normal appearance of ear canals and TMs, thick nasal congestion, mild post oropharyngeal erythema with PND, no tonsillar edema or exudate, no sinus TTP  NECK: no obvious masses on inspection  LUNGS: clear to auscultation bilaterally, no wheezes, rales or rhonchi, good air movement, O2 sats 97% on RA  CV: HRRR, no peripheral edema  MS: moves all extremities without noticeable abnormality  PSYCH: pleasant and cooperative, no obvious depression or anxiety  ASSESSMENT AND PLAN:  Discussed the following assessment and plan:  Acute upper respiratory infection - Plan: doxycycline (VIBRA-TABS) 100 MG tablet, HYDROcodone-homatropine (HYCODAN) 5-1.5 MG/5ML syrup  -Given length of symptoms, he feels symptoms are worsening and in light of his PMH opted to start abx after discussion. Advised of interactions and advised staff to notify coumadin clinic to set up recheck and also sent message to [name]. Advised of risk with cough medication. We discussed treatment side effects, likely course,  and signs of developing a serious illness. -of course, we advised to return or notify a doctor immediately if symptoms worsen or persist or new concerns arise. -also advised routine physical to review HM, discussed vaccines due and he is not sure which he has had elsewhere and opted to opted to wait until he is feeling better for flu  vaccine    Patient Instructions  BEFORE YOU LEAVE: -schedule physical in next 3-4 months  Take the antibiotic (doxycycline) as instructed. Most antibiotics (including this medication can interact with your coumadin and cause increased risks for bleeding. Please follow up with your coumadin clinic. We will notify then that you are taking this medication.  Please use caution with the cough medication and do not drive after taking this.           Colin Benton R.

## 2015-01-05 NOTE — Telephone Encounter (Signed)
Left message with patient's wife to have patient take 2.5 mg of warfarin through Sunday and to come to coumadin clinic on Monday to have INR checked.  Wife verbalized understanding and thanked me for calling.  Patient is starting a round of doxycycline today (11/2).

## 2015-01-06 DIAGNOSIS — M7062 Trochanteric bursitis, left hip: Secondary | ICD-10-CM | POA: Diagnosis not present

## 2015-01-06 DIAGNOSIS — M5416 Radiculopathy, lumbar region: Secondary | ICD-10-CM | POA: Diagnosis not present

## 2015-01-06 DIAGNOSIS — M7061 Trochanteric bursitis, right hip: Secondary | ICD-10-CM | POA: Diagnosis not present

## 2015-01-06 DIAGNOSIS — Z8579 Personal history of other malignant neoplasms of lymphoid, hematopoietic and related tissues: Secondary | ICD-10-CM | POA: Diagnosis not present

## 2015-01-10 ENCOUNTER — Ambulatory Visit (INDEPENDENT_AMBULATORY_CARE_PROVIDER_SITE_OTHER): Payer: Medicare Other | Admitting: General Practice

## 2015-01-10 DIAGNOSIS — D689 Coagulation defect, unspecified: Secondary | ICD-10-CM | POA: Diagnosis not present

## 2015-01-10 DIAGNOSIS — Z5181 Encounter for therapeutic drug level monitoring: Secondary | ICD-10-CM

## 2015-01-10 DIAGNOSIS — Z86718 Personal history of other venous thrombosis and embolism: Secondary | ICD-10-CM | POA: Diagnosis not present

## 2015-01-10 DIAGNOSIS — I2699 Other pulmonary embolism without acute cor pulmonale: Secondary | ICD-10-CM | POA: Diagnosis not present

## 2015-01-10 LAB — POCT INR: INR: 1.2

## 2015-01-10 NOTE — Progress Notes (Signed)
Pre visit review using our clinic review tool, if applicable. No additional management support is needed unless otherwise documented below in the visit note. 

## 2015-01-20 ENCOUNTER — Ambulatory Visit (INDEPENDENT_AMBULATORY_CARE_PROVIDER_SITE_OTHER): Payer: Medicare Other | Admitting: General Practice

## 2015-01-20 ENCOUNTER — Ambulatory Visit: Payer: Medicare Other

## 2015-01-20 DIAGNOSIS — D689 Coagulation defect, unspecified: Secondary | ICD-10-CM | POA: Diagnosis not present

## 2015-01-20 DIAGNOSIS — Z86718 Personal history of other venous thrombosis and embolism: Secondary | ICD-10-CM | POA: Diagnosis not present

## 2015-01-20 DIAGNOSIS — I2699 Other pulmonary embolism without acute cor pulmonale: Secondary | ICD-10-CM

## 2015-01-20 DIAGNOSIS — Z5181 Encounter for therapeutic drug level monitoring: Secondary | ICD-10-CM

## 2015-01-20 LAB — POCT INR: INR: 3

## 2015-01-20 NOTE — Progress Notes (Signed)
Pre visit review using our clinic review tool, if applicable. No additional management support is needed unless otherwise documented below in the visit note. 

## 2015-01-24 ENCOUNTER — Other Ambulatory Visit: Payer: Self-pay | Admitting: Family Medicine

## 2015-02-17 ENCOUNTER — Ambulatory Visit: Payer: Medicare Other

## 2015-02-21 ENCOUNTER — Ambulatory Visit (INDEPENDENT_AMBULATORY_CARE_PROVIDER_SITE_OTHER): Payer: Medicare Other | Admitting: General Practice

## 2015-02-21 DIAGNOSIS — I2699 Other pulmonary embolism without acute cor pulmonale: Secondary | ICD-10-CM

## 2015-02-21 DIAGNOSIS — Z5181 Encounter for therapeutic drug level monitoring: Secondary | ICD-10-CM

## 2015-02-21 LAB — POCT INR: INR: 1.9

## 2015-02-21 NOTE — Progress Notes (Signed)
Pre visit review using our clinic review tool, if applicable. No additional management support is needed unless otherwise documented below in the visit note. 

## 2015-02-25 ENCOUNTER — Other Ambulatory Visit: Payer: Self-pay | Admitting: Family Medicine

## 2015-03-03 ENCOUNTER — Other Ambulatory Visit: Payer: Self-pay | Admitting: Hematology and Oncology

## 2015-03-17 DIAGNOSIS — C9000 Multiple myeloma not having achieved remission: Secondary | ICD-10-CM | POA: Diagnosis not present

## 2015-03-18 ENCOUNTER — Other Ambulatory Visit: Payer: Self-pay | Admitting: Hematology and Oncology

## 2015-03-18 DIAGNOSIS — C9001 Multiple myeloma in remission: Secondary | ICD-10-CM

## 2015-03-21 ENCOUNTER — Ambulatory Visit (HOSPITAL_BASED_OUTPATIENT_CLINIC_OR_DEPARTMENT_OTHER): Payer: Medicare Other | Admitting: Hematology and Oncology

## 2015-03-21 ENCOUNTER — Ambulatory Visit: Payer: Medicare Other

## 2015-03-21 ENCOUNTER — Encounter: Payer: Self-pay | Admitting: Hematology and Oncology

## 2015-03-21 ENCOUNTER — Other Ambulatory Visit: Payer: Medicare Other

## 2015-03-21 ENCOUNTER — Other Ambulatory Visit: Payer: Self-pay | Admitting: Hematology and Oncology

## 2015-03-21 VITALS — BP 112/66 | HR 60 | Temp 97.8°F | Resp 20 | Ht 70.0 in | Wt 208.3 lb

## 2015-03-21 DIAGNOSIS — M899 Disorder of bone, unspecified: Secondary | ICD-10-CM

## 2015-03-21 DIAGNOSIS — C9001 Multiple myeloma in remission: Secondary | ICD-10-CM

## 2015-03-21 DIAGNOSIS — M898X9 Other specified disorders of bone, unspecified site: Secondary | ICD-10-CM

## 2015-03-21 DIAGNOSIS — Z9484 Stem cells transplant status: Secondary | ICD-10-CM | POA: Diagnosis not present

## 2015-03-21 NOTE — Progress Notes (Signed)
[facility] OFFICE PROGRESS NOTE  Patient Care Team: Lucretia Kern, DO as PCP - General (Family Medicine) Heath Lark, MD as Consulting Physician (Hematology and Oncology) Terrilyn Saver, MD as Consulting Physician (Oncology)  SUMMARY OF ONCOLOGIC HISTORY:  The patient was initially diagnosed in 2012. He had chemotherapy followed by autologous stem cell transplant [place] around October 2013. In April 2014 he was started on maintenance Revlimid 10 mg daily by mouth along with monthly Zometa. Starting on 05/18/2013, his Zometa is change to every 3 months.  INTERVAL HISTORY: Please see below for problem oriented charting.  he returns for further follow-up. He has blood drawn in [place] last week and was told "everything" were within normal limits. Unfortunately, we have no access to the records. He has chronic intermittent pain that comes and goes. Yesterday, he had acute onset of severe left hip pain but subsequently resolved spontaneously. He denies severe bone pain today. Denies recent infection.  REVIEW OF SYSTEMS:   Constitutional: Denies fevers, chills or abnormal weight loss Eyes: Denies blurriness of vision Ears, nose, mouth, throat, and face: Denies mucositis or sore throat Respiratory: Denies cough, dyspnea or wheezes Cardiovascular: Denies palpitation, chest discomfort or lower extremity swelling Gastrointestinal:  Denies nausea, heartburn or change in bowel habits Skin: Denies abnormal skin rashes Lymphatics: Denies new lymphadenopathy or easy bruising Neurological:Denies numbness, tingling or new weaknesses Behavioral/Psych: Mood is stable, no new changes  All other systems were reviewed with the patient and are negative.  I have reviewed the past medical history, past surgical history, social history and family history with the patient and they are unchanged from previous note.  ALLERGIES:  is allergic to blood-group  specific substance.  MEDICATIONS:  Current Outpatient Prescriptions  Medication Sig Dispense Refill  . calcium carbonate (TUMS - DOSED IN MG ELEMENTAL CALCIUM) 500 MG chewable tablet Chew 1 tablet by mouth every 2 (two) hours as needed. HEART BURN      . Cholecalciferol (VITAMIN D) 1000 UNITS capsule Take 5,000 Units by mouth daily.    . cyclobenzaprine (FLEXERIL) 10 MG tablet Take 1 tablet (10 mg total) by mouth 3 (three) times daily as needed for muscle spasms. 30 tablet 3  . Glucosamine-Chondroitin (GLUCOSAMINE CHONDR COMPLEX PO) Take 1 tablet by mouth 2 (two) times daily.     Marland Kitchen KRILL OIL PO Take 1 tablet by mouth 2 (two) times daily.     Marland Kitchen levothyroxine (SYNTHROID, LEVOTHROID) 100 MCG tablet TAKE ONE TABLET BY MOUTH ONCE DAILY 30 tablet 0  . methylcellulose (ARTIFICIAL TEARS) 1 % ophthalmic solution Place 2 drops into both eyes 4 (four) times daily as needed (dry eyes).     Marland Kitchen omeprazole (PRILOSEC) 20 MG capsule Take 20 mg by mouth 2 (two) times daily as needed. HEART BURN     . REVLIMID 10 MG capsule Take 10 mg by mouth daily. 21 days on and 7 days off    . warfarin (COUMADIN) 5 MG tablet Take as directed by anticoagulation clinic 30 tablet 11   No current facility-administered medications for this visit.    PHYSICAL EXAMINATION: ECOG PERFORMANCE STATUS: 0 - Asymptomatic  Filed Vitals:   03/21/15 0950  BP: 112/66  Pulse: 60  Temp: 97.8 F (36.6 C)  Resp: 20   Filed Weights   03/21/15 0950  Weight: 208 lb 4.8 oz (94.484 kg)    GENERAL:alert, no distress and comfortable SKIN: skin color, texture, turgor are normal, no rashes or significant  lesions EYES: normal, Conjunctiva are pink and non-injected, sclera clear OROPHARYNX:no exudate, no erythema and lips, buccal mucosa, and tongue normal  NECK: supple, thyroid normal size, non-tender, without nodularity LYMPH:  no palpable lymphadenopathy in the cervical, axillary or inguinal LUNGS: clear to auscultation and percussion  with normal breathing effort HEART: regular rate & rhythm and no murmurs and no lower extremity edema ABDOMEN:abdomen soft, non-tender and normal bowel sounds Musculoskeletal:no cyanosis of digits and no clubbing  NEURO: alert & oriented x 3 with fluent speech, no focal motor/sensory deficits  LABORATORY DATA:  I have reviewed the data as listed    Component Value Date/Time   NA 140 11/18/2013 0756   NA 136 04/06/2012 1950   K 4.0 11/18/2013 0756   K 3.9 04/06/2012 1950   CL 112* 08/27/2012 1309   CL 103 04/06/2012 1950   CO2 20* 11/18/2013 0756   CO2 22 04/06/2012 1950   GLUCOSE 88 11/18/2013 0756   GLUCOSE 100* 08/27/2012 1309   GLUCOSE 110* 04/06/2012 1950   BUN 18.0 11/18/2013 0756   BUN 19 04/06/2012 1950   CREATININE 1.3 11/18/2013 0756   CREATININE 1.25 04/06/2012 1950   CREATININE 0.91 01/01/2011 1630   CALCIUM 8.7 11/18/2013 0756   CALCIUM 8.4 04/06/2012 1950   PROT 7.0 11/18/2013 0756   PROT 6.5 12/24/2011 1643   ALBUMIN 3.4* 11/18/2013 0756   ALBUMIN 4.0 12/24/2011 1643   AST 23 11/18/2013 0756   AST 21 12/24/2011 1643   ALT 33 11/18/2013 0756   ALT 39 12/24/2011 1643   ALKPHOS 83 11/18/2013 0756   ALKPHOS 119* 12/24/2011 1643   BILITOT 0.59 11/18/2013 0756   BILITOT 0.3 12/24/2011 1643   GFRNONAA 57* 04/06/2012 1950   GFRAA 66* 04/06/2012 1950    No results found for: SPEP, UPEP  Lab Results  Component Value Date   WBC 5.9 11/18/2013   NEUTROABS 3.4 11/18/2013   HGB 14.4 11/18/2013   HCT 43.9 11/18/2013   MCV 95.0 11/18/2013   PLT 181 11/18/2013      Chemistry      Component Value Date/Time   NA 140 11/18/2013 0756   NA 136 04/06/2012 1950   K 4.0 11/18/2013 0756   K 3.9 04/06/2012 1950   CL 112* 08/27/2012 1309   CL 103 04/06/2012 1950   CO2 20* 11/18/2013 0756   CO2 22 04/06/2012 1950   BUN 18.0 11/18/2013 0756   BUN 19 04/06/2012 1950   CREATININE 1.3 11/18/2013 0756   CREATININE 1.25 04/06/2012 1950   CREATININE 0.91 01/01/2011  1630      Component Value Date/Time   CALCIUM 8.7 11/18/2013 0756   CALCIUM 8.4 04/06/2012 1950   ALKPHOS 83 11/18/2013 0756   ALKPHOS 119* 12/24/2011 1643   AST 23 11/18/2013 0756   AST 21 12/24/2011 1643   ALT 33 11/18/2013 0756   ALT 39 12/24/2011 1643   BILITOT 0.59 11/18/2013 0756   BILITOT 0.3 12/24/2011 1643      ASSESSMENT & PLAN:  Multiple myeloma in remission ([place])  According to the patient, he had labs drawn in [place] last week and was told that his blood work were fine and he is responding well to treatment. The plan would be to continue Revlimid indefinitely and Zometa every 3 months. Unfortunately, I was not able to get the lab and due to safety reasons and I would not be able to give him Zometa today unless he agreed for further blood draw. The patient declined.  We will reschedule Zometa to next week and will obtain outside information. He has recent dental clearance with no concerns for osteonecrosis of the jaw. He will continue calcium with vitamin D supplements.  Bone pain  He has intermittent, chronic pain in his back and joints. Yesterday he had acute left-sided hip pain but it has resolved by today. Recommend conservative management for now.   No orders of the defined types were placed in this encounter.   All questions were answered. The patient knows to call the clinic with any problems, questions or concerns. No barriers to learning was detected. I spent 15 minutes counseling the patient face to face. The total time spent in the appointment was 20 minutes and more than 50% was on counseling and review of test results     [facility], [place], MD 03/21/2015 1:21 PM

## 2015-03-21 NOTE — Assessment & Plan Note (Signed)
According to the patient, he had labs drawn in [place] last week and was told that his blood work were fine and he is responding well to treatment. The plan would be to continue Revlimid indefinitely and Zometa every 3 months. Unfortunately, I was not able to get the lab and due to safety reasons and I would not be able to give him Zometa today unless he agreed for further blood draw. The patient declined. We will reschedule Zometa to next week and will obtain outside information. He has recent dental clearance with no concerns for osteonecrosis of the jaw. He will continue calcium with vitamin D supplements.

## 2015-03-21 NOTE — Assessment & Plan Note (Signed)
He has intermittent, chronic pain in his back and joints. Yesterday he had acute left-sided hip pain but it has resolved by today. Recommend conservative management for now.

## 2015-03-22 ENCOUNTER — Other Ambulatory Visit: Payer: Medicare Other

## 2015-03-22 ENCOUNTER — Ambulatory Visit: Payer: Medicare Other | Admitting: Hematology and Oncology

## 2015-03-22 ENCOUNTER — Ambulatory Visit: Payer: Medicare Other

## 2015-03-23 ENCOUNTER — Other Ambulatory Visit: Payer: Self-pay | Admitting: *Deleted

## 2015-03-23 ENCOUNTER — Telehealth: Payer: Self-pay | Admitting: Hematology and Oncology

## 2015-03-23 NOTE — Telephone Encounter (Signed)
Not able to reach patient or leave message re appointments for January and April per 1/16 pof - vm full. Schedule mailed.

## 2015-03-24 ENCOUNTER — Ambulatory Visit (INDEPENDENT_AMBULATORY_CARE_PROVIDER_SITE_OTHER): Payer: Medicare Other | Admitting: General Practice

## 2015-03-24 DIAGNOSIS — D689 Coagulation defect, unspecified: Secondary | ICD-10-CM | POA: Diagnosis not present

## 2015-03-24 DIAGNOSIS — I2699 Other pulmonary embolism without acute cor pulmonale: Secondary | ICD-10-CM

## 2015-03-24 DIAGNOSIS — Z5181 Encounter for therapeutic drug level monitoring: Secondary | ICD-10-CM

## 2015-03-24 DIAGNOSIS — Z86718 Personal history of other venous thrombosis and embolism: Secondary | ICD-10-CM | POA: Diagnosis not present

## 2015-03-24 LAB — POCT INR: INR: 2

## 2015-03-29 ENCOUNTER — Other Ambulatory Visit: Payer: Self-pay | Admitting: Family Medicine

## 2015-03-29 ENCOUNTER — Other Ambulatory Visit: Payer: Self-pay | Admitting: *Deleted

## 2015-03-30 ENCOUNTER — Ambulatory Visit (HOSPITAL_BASED_OUTPATIENT_CLINIC_OR_DEPARTMENT_OTHER): Payer: Medicare Other

## 2015-03-30 ENCOUNTER — Other Ambulatory Visit: Payer: Medicare Other

## 2015-03-30 VITALS — BP 106/64 | HR 64 | Temp 98.0°F | Resp 20

## 2015-03-30 DIAGNOSIS — C9001 Multiple myeloma in remission: Secondary | ICD-10-CM

## 2015-03-30 MED ORDER — SODIUM CHLORIDE 0.9 % IV SOLN
Freq: Once | INTRAVENOUS | Status: AC
  Administered 2015-03-30: 10:00:00 via INTRAVENOUS

## 2015-03-30 MED ORDER — ZOLEDRONIC ACID 4 MG/100ML IV SOLN
4.0000 mg | Freq: Once | INTRAVENOUS | Status: AC
  Administered 2015-03-30: 4 mg via INTRAVENOUS
  Filled 2015-03-30: qty 100

## 2015-03-30 NOTE — Progress Notes (Signed)
Patient brought outside labs in from [place], labs drawn 03/17/15.  Creatinine 1.02

## 2015-03-30 NOTE — Patient Instructions (Signed)

## 2015-04-25 ENCOUNTER — Ambulatory Visit (INDEPENDENT_AMBULATORY_CARE_PROVIDER_SITE_OTHER): Payer: Medicare Other | Admitting: General Practice

## 2015-04-25 DIAGNOSIS — Z86718 Personal history of other venous thrombosis and embolism: Secondary | ICD-10-CM | POA: Diagnosis not present

## 2015-04-25 DIAGNOSIS — D689 Coagulation defect, unspecified: Secondary | ICD-10-CM | POA: Diagnosis not present

## 2015-04-25 DIAGNOSIS — I2699 Other pulmonary embolism without acute cor pulmonale: Secondary | ICD-10-CM

## 2015-04-25 DIAGNOSIS — Z5181 Encounter for therapeutic drug level monitoring: Secondary | ICD-10-CM

## 2015-04-25 LAB — POCT INR: INR: 1.7

## 2015-04-25 NOTE — Progress Notes (Signed)
Pre visit review using our clinic review tool, if applicable. No additional management support is needed unless otherwise documented below in the visit note. 

## 2015-04-27 ENCOUNTER — Other Ambulatory Visit: Payer: Self-pay | Admitting: Family Medicine

## 2015-05-23 ENCOUNTER — Ambulatory Visit: Payer: Medicare Other

## 2015-05-30 ENCOUNTER — Other Ambulatory Visit: Payer: Self-pay | Admitting: Family Medicine

## 2015-05-30 ENCOUNTER — Ambulatory Visit (INDEPENDENT_AMBULATORY_CARE_PROVIDER_SITE_OTHER): Payer: Medicare Other | Admitting: General Practice

## 2015-05-30 DIAGNOSIS — Z5181 Encounter for therapeutic drug level monitoring: Secondary | ICD-10-CM | POA: Diagnosis not present

## 2015-05-30 LAB — POCT INR: INR: 1.8

## 2015-05-30 NOTE — Progress Notes (Signed)
Pre visit review using our clinic review tool, if applicable. No additional management support is needed unless otherwise documented below in the visit note. 

## 2015-06-03 ENCOUNTER — Encounter: Payer: Self-pay | Admitting: Family Medicine

## 2015-06-03 ENCOUNTER — Ambulatory Visit (INDEPENDENT_AMBULATORY_CARE_PROVIDER_SITE_OTHER): Payer: Medicare Other | Admitting: Family Medicine

## 2015-06-03 VITALS — BP 108/72 | HR 65 | Temp 98.3°F | Ht 70.0 in | Wt 201.7 lb

## 2015-06-03 DIAGNOSIS — K529 Noninfective gastroenteritis and colitis, unspecified: Secondary | ICD-10-CM

## 2015-06-03 MED ORDER — ONDANSETRON HCL 4 MG PO TABS: 4.0000 mg | ORAL_TABLET | Freq: Three times a day (TID) | ORAL | Status: DC | PRN

## 2015-06-03 NOTE — Progress Notes (Signed)
Pre visit review using our clinic review tool, if applicable. No additional management support is needed unless otherwise documented below in the visit note. 

## 2015-06-03 NOTE — Progress Notes (Signed)
HPI:  Rodney [name] is a 75 year old with multiple myeloma and a history of pulmonary embolism here for an acute visit for gastroenteritis. His wife had this a few days ago, and his symptoms started yesterday. He had several episodes of watery diarrhea and nausea with several episodes of emesis yesterday. Nonbilious, no blood. No hematochezia or melena. No fevers. He is tolerating oral intake of fluids and has been drinking lots of Gatorade. Doing a little better today. No vomiting today. Has had 2 episodes of diarrhea today.   ROS: See pertinent positives and negatives per HPI.  Past Medical History  Diagnosis Date  . Pancreatitis   . Pulmonary embolism ([place])   . Complication of anesthesia 02-16-11    Pt. speaks of awakening during the surgery from back  surgery  . Shortness of breath 02-16-11    hx. Pulmonary emboli x2 (9 yrs/ 3'12 -last)  . Anemia 02-16-11    01-05-11- post surgery-Transfusions x 4 units  . Prostate cancer ([place])   . Multiple myeloma 02-16-11    suspected tumor  left sacral  . Degenerative disc disease 02-16-11    01-05-11 L3 fusion done  . Hernia 02-16-11    left inguinal hernia at present  . Bone pain 02/18/2013  . Second degree atrioventricular block 11/26/2012  . Esophageal reflux 05/31/2008    Qualifier: Diagnosis of  By: Arnoldo Morale MD, [place] history of traumatic fracture 07/10/2012  . Hypothyroidism 02/01/2014    Past Surgical History  Procedure Laterality Date  . Back surgery  02-16-11     01-05-11 Lumbar surgery L3(complicated by loss of blood volume)/ 01-09-11 then Lumbar fusion done with retained  hardware   . Cholecystectomy  04/06/2010    laparoscopic-inflammation with stones  . Inguinal hernia repair  02/20/2011    Procedure: HERNIA REPAIR INGUINAL ADULT;  Surgeon: Earnstine Regal, MD;  Location: [facility] ORS;  Service: General;  Laterality: Left;  Repair Left Inguinal Hernia with Mesh  . Hernia repair  02/20/11    Inguinal hernia repair w/mesh   . Prostate surgery Bilateral 2011    Family History  Problem Relation Age of Onset  . Lymphoma Father 69  . Cancer Father   . Clotting disorder Mother     blood clots  . Cancer Brother     throat ca    Social History   Social History  . Marital Status: Married    Spouse Name: N/A  . Number of Children: N/A  . Years of Education: N/A   Social History Main Topics  . Smoking status: Former Smoker    Quit date: 02/15/1969  . Smokeless tobacco: Never Used     Comment: quit 50 yrs  . Alcohol Use: No  . Drug Use: No  . Sexual Activity: Yes   Other Topics Concern  . None   Social History Narrative   Married   Regular exercise-yes     Current outpatient prescriptions:  .  calcium carbonate (TUMS - DOSED IN MG ELEMENTAL CALCIUM) 500 MG chewable tablet, Chew 1 tablet by mouth every 2 (two) hours as needed. HEART BURN  , Disp: , Rfl:  .  Cholecalciferol (VITAMIN D) 1000 UNITS capsule, Take 5,000 Units by mouth daily., Disp: , Rfl:  .  cyclobenzaprine (FLEXERIL) 10 MG tablet, Take 1 tablet (10 mg total) by mouth 3 (three) times daily as needed for muscle spasms., Disp: 30 tablet, Rfl: 3 .  Glucosamine-Chondroitin (GLUCOSAMINE CHONDR COMPLEX PO), Take 1  tablet by mouth 2 (two) times daily. , Disp: , Rfl:  .  KRILL OIL PO, Take 1 tablet by mouth 2 (two) times daily. , Disp: , Rfl:  .  levothyroxine (SYNTHROID, LEVOTHROID) 100 MCG tablet, TAKE ONE TABLET BY MOUTH ONCE DAILY *PATIENT  NEEDS  APPOINTMENT*, Disp: 30 tablet, Rfl: 0 .  methylcellulose (ARTIFICIAL TEARS) 1 % ophthalmic solution, Place 2 drops into both eyes 4 (four) times daily as needed (dry eyes). , Disp: , Rfl:  .  omeprazole (PRILOSEC) 20 MG capsule, Take 20 mg by mouth 2 (two) times daily as needed. HEART BURN , Disp: , Rfl:  .  REVLIMID 10 MG capsule, Take 10 mg by mouth daily. 21 days on and 7 days off, Disp: , Rfl:  .  warfarin (COUMADIN) 5 MG tablet, Take as directed by anticoagulation clinic, Disp: 30 tablet,  Rfl: 11 .  ondansetron (ZOFRAN) 4 MG tablet, Take 1 tablet (4 mg total) by mouth every 8 (eight) hours as needed for nausea or vomiting., Disp: 20 tablet, Rfl: 0  EXAM:  Filed Vitals:   06/03/15 0808  BP: 108/72  Pulse: 65  Temp: 98.3 F (36.8 C)    Body mass index is 28.94 kg/(m^2).  GENERAL: vitals reviewed and listed above, alert, oriented, appears well hydrated and in no acute distress  HEENT: atraumatic, conjunttiva clear, no obvious abnormalities on inspection of external nose and ears  NECK: no obvious masses on inspection  LUNGS: clear to auscultation bilaterally, no wheezes, rales or rhonchi, good air movement  CV: HRRR, no peripheral edema  MS: moves all extremities without noticeable abnormality  ABD: BS+, Soft, NTTP  PSYCH: pleasant and cooperative, no obvious depression or anxiety  ASSESSMENT AND PLAN:  Discussed the following assessment and plan:  Gastroenteritis  -Seems to be improving. Benign exam. -Appears well hydrated. Normal vitals. Tolerating by mouth fluids well. -PO hydration, zofran prn, immodium.  -discussed getting labs, he had with onc and ok recently, declined. Agrees to follow up if needed. -Patient advised to return or notify a doctor immediately if symptoms worsen or persist or new concerns arise.  Patient Instructions  Plenty of fluids with electrolytes  Zofran per instructions if needed for nausea and vomiting  Imodium (loperamide), available over the counter, for diarrhea  Viral Gastroenteritis Viral gastroenteritis is also known as stomach flu. This condition affects the stomach and intestinal tract. It can cause sudden diarrhea and vomiting. The illness typically lasts 3 to 8 days. Most people develop an immune response that eventually gets rid of the virus. While this natural response develops, the virus can make you quite ill. CAUSES  Many different viruses can cause gastroenteritis, such as rotavirus or noroviruses. You can  catch one of these viruses by consuming contaminated food or water. You may also catch a virus by sharing utensils or other personal items with an infected person or by touching a contaminated surface. SYMPTOMS  The most common symptoms are diarrhea and vomiting. These problems can cause a severe loss of body fluids (dehydration) and a body salt (electrolyte) imbalance. Other symptoms may include:  Fever.  Headache.  Fatigue.  Abdominal pain. DIAGNOSIS  Your caregiver can usually diagnose viral gastroenteritis based on your symptoms and a physical exam. A stool sample may also be taken to test for the presence of viruses or other infections. TREATMENT  This illness typically goes away on its own. Treatments are aimed at rehydration. The most serious cases of viral gastroenteritis involve vomiting so  severely that you are not able to keep fluids down. In these cases, fluids must be given through an intravenous line (IV). HOME CARE INSTRUCTIONS   Drink enough fluids to keep your urine clear or pale yellow. Drink small amounts of fluids frequently and increase the amounts as tolerated.  Ask your caregiver for specific rehydration instructions.  Avoid:  Foods high in sugar.  Alcohol.  Carbonated drinks.  Tobacco.  Juice.  Caffeine drinks.  Extremely hot or cold fluids.  Fatty, greasy foods.  Too much intake of anything at one time.  Dairy products until 24 to 48 hours after diarrhea stops.  You may consume probiotics. Probiotics are active cultures of beneficial bacteria. They may lessen the amount and number of diarrheal stools in adults. Probiotics can be found in yogurt with active cultures and in supplements.  Wash your hands well to avoid spreading the virus.  Only take over-the-counter or prescription medicines for pain, discomfort, or fever as directed by your caregiver. Do not give aspirin to children. Antidiarrheal medicines are not recommended.  Ask your  caregiver if you should continue to take your regular prescribed and over-the-counter medicines.  Keep all follow-up appointments as directed by your caregiver. SEEK IMMEDIATE MEDICAL CARE IF:   You are unable to keep fluids down.  You do not urinate at least once every 6 to 8 hours.  You develop shortness of breath.  You notice blood in your stool or vomit. This may look like coffee grounds.  You have abdominal pain that increases or is concentrated in one small area (localized).  You have persistent vomiting or diarrhea.  You have a fever.  The patient is a child younger than 3 months, and he or she has a fever.  The patient is a child older than 3 months, and he or she has a fever and persistent symptoms.  The patient is a child older than 3 months, and he or she has a fever and symptoms suddenly get worse.  The patient is a baby, and he or she has no tears when crying. MAKE SURE YOU:   Understand these instructions.  Will watch your condition.  Will get help right away if you are not doing well or get worse.   This information is not intended to replace advice given to you by your health care provider. Make sure you discuss any questions you have with your health care provider.   Document Released: 02/19/2005 Document Revised: 05/14/2011 Document Reviewed: 12/06/2010 Elsevier Interactive Patient Education 2016 [place], [place]

## 2015-06-03 NOTE — Patient Instructions (Addendum)
Work note, return to work when no diarrhea or vomiting for 24 hours  Plenty of fluids with electrolytes  Zofran per instructions if needed for nausea and vomiting  Imodium (loperamide), available over the counter, for diarrhea  Viral Gastroenteritis Viral gastroenteritis is also known as stomach flu. This condition affects the stomach and intestinal tract. It can cause sudden diarrhea and vomiting. The illness typically lasts 3 to 8 days. Most people develop an immune response that eventually gets rid of the virus. While this natural response develops, the virus can make you quite ill. CAUSES  Many different viruses can cause gastroenteritis, such as rotavirus or noroviruses. You can catch one of these viruses by consuming contaminated food or water. You may also catch a virus by sharing utensils or other personal items with an infected person or by touching a contaminated surface. SYMPTOMS  The most common symptoms are diarrhea and vomiting. These problems can cause a severe loss of body fluids (dehydration) and a body salt (electrolyte) imbalance. Other symptoms may include:  Fever.  Headache.  Fatigue.  Abdominal pain. DIAGNOSIS  Your caregiver can usually diagnose viral gastroenteritis based on your symptoms and a physical exam. A stool sample may also be taken to test for the presence of viruses or other infections. TREATMENT  This illness typically goes away on its own. Treatments are aimed at rehydration. The most serious cases of viral gastroenteritis involve vomiting so severely that you are not able to keep fluids down. In these cases, fluids must be given through an intravenous line (IV). HOME CARE INSTRUCTIONS   Drink enough fluids to keep your urine clear or pale yellow. Drink small amounts of fluids frequently and increase the amounts as tolerated.  Ask your caregiver for specific rehydration instructions.  Avoid:  Foods high in sugar.  Alcohol.  Carbonated  drinks.  Tobacco.  Juice.  Caffeine drinks.  Extremely hot or cold fluids.  Fatty, greasy foods.  Too much intake of anything at one time.  Dairy products until 24 to 48 hours after diarrhea stops.  You may consume probiotics. Probiotics are active cultures of beneficial bacteria. They may lessen the amount and number of diarrheal stools in adults. Probiotics can be found in yogurt with active cultures and in supplements.  Wash your hands well to avoid spreading the virus.  Only take over-the-counter or prescription medicines for pain, discomfort, or fever as directed by your caregiver. Do not give aspirin to children. Antidiarrheal medicines are not recommended.  Ask your caregiver if you should continue to take your regular prescribed and over-the-counter medicines.  Keep all follow-up appointments as directed by your caregiver. SEEK IMMEDIATE MEDICAL CARE IF:   You are unable to keep fluids down.  You do not urinate at least once every 6 to 8 hours.  You develop shortness of breath.  You notice blood in your stool or vomit. This may look like coffee grounds.  You have abdominal pain that increases or is concentrated in one small area (localized).  You have persistent vomiting or diarrhea.  You have a fever.  The patient is a child younger than 3 months, and he or she has a fever.  The patient is a child older than 3 months, and he or she has a fever and persistent symptoms.  The patient is a child older than 3 months, and he or she has a fever and symptoms suddenly get worse.  The patient is a baby, and he or she has no tears  when crying. MAKE SURE YOU:   Understand these instructions.  Will watch your condition.  Will get help right away if you are not doing well or get worse.   This information is not intended to replace advice given to you by your health care provider. Make sure you discuss any questions you have with your health care provider.    Document Released: 02/19/2005 Document Revised: 05/14/2011 Document Reviewed: 12/06/2010 Elsevier Interactive Patient Education [employer].

## 2015-06-23 ENCOUNTER — Other Ambulatory Visit: Payer: Self-pay | Admitting: Hematology and Oncology

## 2015-06-23 ENCOUNTER — Telehealth: Payer: Self-pay | Admitting: *Deleted

## 2015-06-23 DIAGNOSIS — C9000 Multiple myeloma not having achieved remission: Secondary | ICD-10-CM | POA: Diagnosis not present

## 2015-06-23 NOTE — Telephone Encounter (Signed)
Pt states he is getting lab work done today and will request results faxed to our office.  Instructed pt to make sure Creatinine value,  Included in BMET or CMET, is done.  Informed him this is the only lab Dr. Alvy Bimler will need to give him Zometa on Tuesday as scheduled.  Pt verbalized understanding.

## 2015-06-27 ENCOUNTER — Ambulatory Visit (INDEPENDENT_AMBULATORY_CARE_PROVIDER_SITE_OTHER): Payer: Medicare Other | Admitting: General Practice

## 2015-06-27 DIAGNOSIS — Z86718 Personal history of other venous thrombosis and embolism: Secondary | ICD-10-CM

## 2015-06-27 DIAGNOSIS — D689 Coagulation defect, unspecified: Secondary | ICD-10-CM

## 2015-06-27 DIAGNOSIS — Z5181 Encounter for therapeutic drug level monitoring: Secondary | ICD-10-CM | POA: Diagnosis not present

## 2015-06-27 DIAGNOSIS — I2699 Other pulmonary embolism without acute cor pulmonale: Secondary | ICD-10-CM

## 2015-06-27 LAB — POCT INR: INR: 1.8

## 2015-06-27 NOTE — Progress Notes (Signed)
Pre visit review using our clinic review tool, if applicable. No additional management support is needed unless otherwise documented below in the visit note. 

## 2015-06-28 ENCOUNTER — Other Ambulatory Visit: Payer: Medicare Other

## 2015-06-28 ENCOUNTER — Ambulatory Visit (HOSPITAL_BASED_OUTPATIENT_CLINIC_OR_DEPARTMENT_OTHER): Payer: Medicare Other | Admitting: Hematology and Oncology

## 2015-06-28 ENCOUNTER — Telehealth: Payer: Self-pay | Admitting: Hematology and Oncology

## 2015-06-28 ENCOUNTER — Encounter: Payer: Self-pay | Admitting: Hematology and Oncology

## 2015-06-28 ENCOUNTER — Ambulatory Visit (HOSPITAL_BASED_OUTPATIENT_CLINIC_OR_DEPARTMENT_OTHER): Payer: Medicare Other

## 2015-06-28 VITALS — BP 134/68 | HR 56 | Temp 98.3°F | Resp 20 | Ht 70.0 in | Wt 205.9 lb

## 2015-06-28 DIAGNOSIS — C9001 Multiple myeloma in remission: Secondary | ICD-10-CM

## 2015-06-28 DIAGNOSIS — I2699 Other pulmonary embolism without acute cor pulmonale: Secondary | ICD-10-CM

## 2015-06-28 MED ORDER — SODIUM CHLORIDE 0.9 % IV SOLN
Freq: Once | INTRAVENOUS | Status: AC
  Administered 2015-06-28: 10:00:00 via INTRAVENOUS

## 2015-06-28 MED ORDER — ZOLEDRONIC ACID 4 MG/100ML IV SOLN
4.0000 mg | Freq: Once | INTRAVENOUS | Status: AC
Start: 2015-06-28 — End: 2015-06-28
  Administered 2015-06-28: 4 mg via INTRAVENOUS
  Filled 2015-06-28: qty 100

## 2015-06-28 NOTE — Assessment & Plan Note (Signed)
According to the patient, he had labs drawn in [place] last week and was told that his blood work were fine and he is responding well to treatment. The plan would be to continue Revlimid indefinitely and Zometa every 3 months. He has recent dental clearance with no concerns for osteonecrosis of the jaw. He will continue calcium with vitamin D supplements.

## 2015-06-28 NOTE — Patient Instructions (Signed)

## 2015-06-28 NOTE — Assessment & Plan Note (Addendum)
He will continue on Coumadin, managed by primary care doctor. The patient denies any recent signs or symptoms of bleeding such as spontaneous epistaxis, hematuria or hematochezia. 

## 2015-06-28 NOTE — Telephone Encounter (Signed)
Gave and printed appt sched and avs for pt for July  °

## 2015-06-28 NOTE — Progress Notes (Signed)
Crea  1.12  From labs done on 06/23/15 at Dr. Philipp Ovens' office in [place].  Copy of lab sent to HIM for scanning.

## 2015-06-28 NOTE — Progress Notes (Signed)
[facility] OFFICE PROGRESS NOTE  Patient Care Team: Lucretia Kern, DO as PCP - General (Family Medicine) Heath Lark, MD as Consulting Physician (Hematology and Oncology) Terrilyn Saver, MD as Consulting Physician (Oncology)  SUMMARY OF ONCOLOGIC HISTORY:  The patient was initially diagnosed in 2012. He had chemotherapy followed by autologous stem cell transplant [place] around October 2013. In April 2014 he was started on maintenance Revlimid 10 mg daily by mouth along with monthly Zometa. He receives his Revlimid through his physician from [place]. He is on warfarin therapy indefinitely, INR monitored by other physicians Starting on 05/18/2013, his Zometa is change to every 3 months.  INTERVAL HISTORY: Please see below for problem oriented charting. He feels well. Denies recent infection. He continues to have chronic bone pain, stable. No recent fractures. He had follow-up in [place] recently with his other oncologists. He remained in remission. He has frequent dental cleaning and checkup with no evidence to suggest osteonecrosis of the jaw.  REVIEW OF SYSTEMS:   Constitutional: Denies fevers, chills or abnormal weight loss Eyes: Denies blurriness of vision Ears, nose, mouth, throat, and face: Denies mucositis or sore throat Respiratory: Denies cough, dyspnea or wheezes Cardiovascular: Denies palpitation, chest discomfort or lower extremity swelling Gastrointestinal:  Denies nausea, heartburn or change in bowel habits Skin: Denies abnormal skin rashes Lymphatics: Denies new lymphadenopathy or easy bruising Neurological:Denies numbness, tingling or new weaknesses Behavioral/Psych: Mood is stable, no new changes  All other systems were reviewed with the patient and are negative.  I have reviewed the past medical history, past surgical history, social history and family history with the patient and they are unchanged from previous  note.  ALLERGIES:  is allergic to blood-group specific substance.  MEDICATIONS:  Current Outpatient Prescriptions  Medication Sig Dispense Refill  . calcium carbonate (TUMS - DOSED IN MG ELEMENTAL CALCIUM) 500 MG chewable tablet Chew 1 tablet by mouth every 2 (two) hours as needed. HEART BURN      . Cholecalciferol (VITAMIN D) 1000 UNITS capsule Take 5,000 Units by mouth daily.    . cyclobenzaprine (FLEXERIL) 10 MG tablet Take 1 tablet (10 mg total) by mouth 3 (three) times daily as needed for muscle spasms. 30 tablet 3  . Glucosamine-Chondroitin (GLUCOSAMINE CHONDR COMPLEX PO) Take 1 tablet by mouth 2 (two) times daily.     Marland Kitchen KRILL OIL PO Take 1 tablet by mouth 2 (two) times daily.     Marland Kitchen levothyroxine (SYNTHROID, LEVOTHROID) 100 MCG tablet TAKE ONE TABLET BY MOUTH ONCE DAILY *PATIENT  NEEDS  APPOINTMENT* 30 tablet 0  . methylcellulose (ARTIFICIAL TEARS) 1 % ophthalmic solution Place 2 drops into both eyes 4 (four) times daily as needed (dry eyes).     Marland Kitchen omeprazole (PRILOSEC) 20 MG capsule Take 20 mg by mouth 2 (two) times daily as needed. HEART BURN     . ondansetron (ZOFRAN) 4 MG tablet Take 1 tablet (4 mg total) by mouth every 8 (eight) hours as needed for nausea or vomiting. 20 tablet 0  . REVLIMID 10 MG capsule Take 10 mg by mouth daily. 21 days on and 7 days off    . warfarin (COUMADIN) 5 MG tablet Take as directed by anticoagulation clinic 30 tablet 11   No current facility-administered medications for this visit.    PHYSICAL EXAMINATION: ECOG PERFORMANCE STATUS: 0 - Asymptomatic  Filed Vitals:   06/28/15 0930  BP: 134/68  Pulse: 56  Temp: 98.3 F (36.8 C)  Resp: 20   Filed Weights   06/28/15 0930  Weight: 205 lb 14.4 oz (93.396 kg)    GENERAL:alert, no distress and comfortable SKIN: skin color, texture, turgor are normal, no rashes or significant lesions EYES: normal, Conjunctiva are pink and non-injected, sclera clear OROPHARYNX:no exudate, no erythema and lips,  buccal mucosa, and tongue normal . Poor dentition but no evidence of osteonecrosis of the jaw NECK: supple, thyroid normal size, non-tender, without nodularity LYMPH:  no palpable lymphadenopathy in the cervical, axillary or inguinal LUNGS: clear to auscultation and percussion with normal breathing effort HEART: regular rate & rhythm and no murmurs and no lower extremity edema ABDOMEN:abdomen soft, non-tender and normal bowel sounds Musculoskeletal:no cyanosis of digits and no clubbing  NEURO: alert & oriented x 3 with fluent speech, no focal motor/sensory deficits  LABORATORY DATA: I reviewed his blood work from last week that he brought with him I have reviewed the data as listed    Component Value Date/Time   NA 140 11/18/2013 0756   NA 136 04/06/2012 1950   K 4.0 11/18/2013 0756   K 3.9 04/06/2012 1950   CL 112* 08/27/2012 1309   CL 103 04/06/2012 1950   CO2 20* 11/18/2013 0756   CO2 22 04/06/2012 1950   GLUCOSE 88 11/18/2013 0756   GLUCOSE 100* 08/27/2012 1309   GLUCOSE 110* 04/06/2012 1950   BUN 18.0 11/18/2013 0756   BUN 19 04/06/2012 1950   CREATININE 1.3 11/18/2013 0756   CREATININE 1.25 04/06/2012 1950   CREATININE 0.91 01/01/2011 1630   CALCIUM 8.7 11/18/2013 0756   CALCIUM 8.4 04/06/2012 1950   PROT 7.0 11/18/2013 0756   PROT 6.5 12/24/2011 1643   ALBUMIN 3.4* 11/18/2013 0756   ALBUMIN 4.0 12/24/2011 1643   AST 23 11/18/2013 0756   AST 21 12/24/2011 1643   ALT 33 11/18/2013 0756   ALT 39 12/24/2011 1643   ALKPHOS 83 11/18/2013 0756   ALKPHOS 119* 12/24/2011 1643   BILITOT 0.59 11/18/2013 0756   BILITOT 0.3 12/24/2011 1643   GFRNONAA 57* 04/06/2012 1950   GFRAA 66* 04/06/2012 1950    No results found for: SPEP, UPEP  Lab Results  Component Value Date   WBC 5.9 11/18/2013   NEUTROABS 3.4 11/18/2013   HGB 14.4 11/18/2013   HCT 43.9 11/18/2013   MCV 95.0 11/18/2013   PLT 181 11/18/2013      Chemistry      Component Value Date/Time   NA 140  11/18/2013 0756   NA 136 04/06/2012 1950   K 4.0 11/18/2013 0756   K 3.9 04/06/2012 1950   CL 112* 08/27/2012 1309   CL 103 04/06/2012 1950   CO2 20* 11/18/2013 0756   CO2 22 04/06/2012 1950   BUN 18.0 11/18/2013 0756   BUN 19 04/06/2012 1950   CREATININE 1.3 11/18/2013 0756   CREATININE 1.25 04/06/2012 1950   CREATININE 0.91 01/01/2011 1630      Component Value Date/Time   CALCIUM 8.7 11/18/2013 0756   CALCIUM 8.4 04/06/2012 1950   ALKPHOS 83 11/18/2013 0756   ALKPHOS 119* 12/24/2011 1643   AST 23 11/18/2013 0756   AST 21 12/24/2011 1643   ALT 33 11/18/2013 0756   ALT 39 12/24/2011 1643   BILITOT 0.59 11/18/2013 0756   BILITOT 0.3 12/24/2011 1643     ASSESSMENT & PLAN:  Multiple myeloma in remission ([place])  According to the patient, he had labs drawn in [place] last week and was told that his blood  work were fine and he is responding well to treatment. The plan would be to continue Revlimid indefinitely and Zometa every 3 months. He has recent dental clearance with no concerns for osteonecrosis of the jaw. He will continue calcium with vitamin D supplements.  Pulmonary embolism He will continue on Coumadin, managed by primary care doctor. The patient denies any recent signs or symptoms of bleeding such as spontaneous epistaxis, hematuria or hematochezia.   No orders of the defined types were placed in this encounter.   All questions were answered. The patient knows to call the clinic with any problems, questions or concerns. No barriers to learning was detected. I spent 15 minutes counseling the patient face to face. The total time spent in the appointment was 20 minutes and more than 50% was on counseling and review of test results     [facility], [place], MD 06/28/2015 9:46 AM

## 2015-07-03 ENCOUNTER — Other Ambulatory Visit: Payer: Self-pay | Admitting: Family Medicine

## 2015-07-21 ENCOUNTER — Ambulatory Visit (INDEPENDENT_AMBULATORY_CARE_PROVIDER_SITE_OTHER): Payer: Medicare Other | Admitting: General Practice

## 2015-07-21 ENCOUNTER — Encounter: Payer: Self-pay | Admitting: Family Medicine

## 2015-07-21 ENCOUNTER — Ambulatory Visit (INDEPENDENT_AMBULATORY_CARE_PROVIDER_SITE_OTHER): Payer: Medicare Other | Admitting: Family Medicine

## 2015-07-21 VITALS — BP 102/68 | HR 62 | Temp 98.0°F | Ht 70.0 in | Wt 203.6 lb

## 2015-07-21 DIAGNOSIS — I2699 Other pulmonary embolism without acute cor pulmonale: Secondary | ICD-10-CM

## 2015-07-21 DIAGNOSIS — Z5181 Encounter for therapeutic drug level monitoring: Secondary | ICD-10-CM

## 2015-07-21 DIAGNOSIS — T148 Other injury of unspecified body region: Secondary | ICD-10-CM

## 2015-07-21 DIAGNOSIS — R791 Abnormal coagulation profile: Secondary | ICD-10-CM | POA: Diagnosis not present

## 2015-07-21 DIAGNOSIS — D689 Coagulation defect, unspecified: Secondary | ICD-10-CM

## 2015-07-21 DIAGNOSIS — W19XXXA Unspecified fall, initial encounter: Secondary | ICD-10-CM | POA: Diagnosis not present

## 2015-07-21 DIAGNOSIS — Z86718 Personal history of other venous thrombosis and embolism: Secondary | ICD-10-CM

## 2015-07-21 DIAGNOSIS — T148XXA Other injury of unspecified body region, initial encounter: Secondary | ICD-10-CM

## 2015-07-21 LAB — POCT INR: INR: 4.3

## 2015-07-21 NOTE — Progress Notes (Addendum)
HPI:  Acute visit for a Fall/Bruise/supratherapeutic INR: -L buttock -mechanical fall, tripped over paint, 3 weeks ago -reports since worker comp had to see physician ad [facility] and they did complete evaluation and xrays and all was good -was doing ok and felt ok, but wife noticed yesterday that he still has bruise and was concerned -he still has some tenderness here with pressure or certain movements -on coumadin, seen in coumadin clinic today and mildly supratherapeutic, reports dose adjustment and f/u next week -denies: inability to bear weight, radiation, weakness, numbness, bleeding -he is out of date on several HM measures and has opted against preventive care and routing follow up visits in the past as he feels this is addressed in his hospital visits  ROS: See pertinent positives and negatives per HPI.  Past Medical History  Diagnosis Date  . Pancreatitis   . Pulmonary embolism ([place])   . Complication of anesthesia 02-16-11    Pt. speaks of awakening during the surgery from back  surgery  . Shortness of breath 02-16-11    hx. Pulmonary emboli x2 (9 yrs/ 3'12 -last)  . Anemia 02-16-11    01-05-11- post surgery-Transfusions x 4 units  . Prostate cancer ([place])   . Multiple myeloma 02-16-11    suspected tumor  left sacral  . Degenerative disc disease 02-16-11    01-05-11 L3 fusion done  . Hernia 02-16-11    left inguinal hernia at present  . Bone pain 02/18/2013  . Second degree atrioventricular block 11/26/2012  . Esophageal reflux 05/31/2008    Qualifier: Diagnosis of  By: Arnoldo Morale MD, [place] history of traumatic fracture 07/10/2012  . Hypothyroidism 02/01/2014    Past Surgical History  Procedure Laterality Date  . Back surgery  02-16-11     01-05-11 Lumbar surgery L3(complicated by loss of blood volume)/ 01-09-11 then Lumbar fusion done with retained  hardware   . Cholecystectomy  04/06/2010    laparoscopic-inflammation with stones  . Inguinal hernia  repair  02/20/2011    Procedure: HERNIA REPAIR INGUINAL ADULT;  Surgeon: Earnstine Regal, MD;  Location: [facility];  Service: General;  Laterality: Left;  Repair Left Inguinal Hernia with Mesh  . Hernia repair  02/20/11    Inguinal hernia repair w/mesh  . Prostate surgery Bilateral 2011    Family History  Problem Relation Age of Onset  . Lymphoma Father 67  . Cancer Father   . Clotting disorder Mother     blood clots  . Cancer Brother     throat ca    Social History   Social History  . Marital Status: Married    Spouse Name: N/A  . Number of Children: N/A  . Years of Education: N/A   Social History Main Topics  . Smoking status: Former Smoker    Quit date: 02/15/1969  . Smokeless tobacco: Never Used     Comment: quit 50 yrs  . Alcohol Use: No  . Drug Use: No  . Sexual Activity: Yes   Other Topics Concern  . None   Social History Narrative   Married   Regular exercise-yes     Current outpatient prescriptions:  .  calcium carbonate (TUMS - DOSED IN MG ELEMENTAL CALCIUM) 500 MG chewable tablet, Chew 1 tablet by mouth every 2 (two) hours as needed. HEART BURN  , Disp: , Rfl:  .  Cholecalciferol (VITAMIN D) 1000 UNITS capsule, Take 5,000 Units by mouth daily., Disp: , Rfl:  .  cyclobenzaprine (FLEXERIL) 10 MG tablet, Take 1 tablet (10 mg total) by mouth 3 (three) times daily as needed for muscle spasms., Disp: 30 tablet, Rfl: 3 .  Glucosamine-Chondroitin (GLUCOSAMINE CHONDR COMPLEX PO), Take 1 tablet by mouth 2 (two) times daily. , Disp: , Rfl:  .  KRILL OIL PO, Take 1 tablet by mouth 2 (two) times daily. , Disp: , Rfl:  .  levothyroxine (SYNTHROID, LEVOTHROID) 100 MCG tablet, TAKE ONE TABLET BY MOUTH ONCE DAILY. NEED APPOINTMENT, Disp: 30 tablet, Rfl: 0 .  methylcellulose (ARTIFICIAL TEARS) 1 % ophthalmic solution, Place 2 drops into both eyes 4 (four) times daily as needed (dry eyes). , Disp: , Rfl:  .  omeprazole (PRILOSEC) 20 MG capsule, Take 20 mg by mouth 2 (two) times  daily as needed. HEART BURN , Disp: , Rfl:  .  ondansetron (ZOFRAN) 4 MG tablet, Take 1 tablet (4 mg total) by mouth every 8 (eight) hours as needed for nausea or vomiting., Disp: 20 tablet, Rfl: 0 .  REVLIMID 10 MG capsule, Take 10 mg by mouth daily. 21 days on and 7 days off, Disp: , Rfl:  .  warfarin (COUMADIN) 5 MG tablet, Take as directed by anticoagulation clinic, Disp: 30 tablet, Rfl: 11  EXAM:  Filed Vitals:   07/21/15 0922  BP: 102/68  Pulse: 62  Temp: 98 F (36.7 C)    Body mass index is 29.21 kg/(m^2).  GENERAL: vitals reviewed and listed above, alert, oriented, appears well hydrated and in no acute distress  HEENT: atraumatic, conjunttiva clear, no obvious abnormalities on inspection of external nose and ears  NECK: no obvious masses on inspection  LUNGS: clear to auscultation bilaterally, no wheezes, rales or rhonchi, good air movement  CV: HRRR, no peripheral edema  MS: cautious gait, no sig TTP over the bony features of the hip, sacrum, lower back and buttock, see below, rotation of hip and hip compression test ok  SKIN: resolving bruise on L buttock, no hematoma appreciated  PSYCH: pleasant and cooperative, no obvious depression or anxiety  ASSESSMENT AND PLAN:  Discussed the following assessment and plan:  Bruise  Fall, initial encounter  Other pulmonary embolism without acute cor pulmonale, unspecified chronicity (HCC)  Supratherapeutic INR  -contusion L buttock, advised assistant to obtain records from outside clinic -seems contusion is healing well without signs or symptoms to suggest serious injury or ongoing bleeding -supportive care measures -follow up with coumadin clinic as planned -advised of out of date HM measures - he agreed to wellness visit with susan next week to discuss further and address -Patient advised to return or notify a doctor immediately if symptoms worsen or persist or new concerns arise.  Received xray reports. Reviewed.  Spoke with Dr. Tamala Julian in radiology to confirm area of concern, Ischium, included in images. Reports to scan box.  There are no Patient Instructions on file for this visit.   Colin Benton R.

## 2015-07-21 NOTE — Progress Notes (Signed)
Pre visit review using our clinic review tool, if applicable. No additional management support is needed unless otherwise documented below in the visit note. 

## 2015-07-25 ENCOUNTER — Ambulatory Visit: Payer: Medicare Other

## 2015-07-29 ENCOUNTER — Ambulatory Visit (INDEPENDENT_AMBULATORY_CARE_PROVIDER_SITE_OTHER): Payer: Medicare Other

## 2015-07-29 VITALS — BP 110/60 | HR 56 | Ht 71.0 in | Wt 202.1 lb

## 2015-07-29 DIAGNOSIS — D689 Coagulation defect, unspecified: Secondary | ICD-10-CM | POA: Diagnosis not present

## 2015-07-29 DIAGNOSIS — I2699 Other pulmonary embolism without acute cor pulmonale: Secondary | ICD-10-CM

## 2015-07-29 DIAGNOSIS — Z0000 Encounter for general adult medical examination without abnormal findings: Secondary | ICD-10-CM

## 2015-07-29 DIAGNOSIS — Z86718 Personal history of other venous thrombosis and embolism: Secondary | ICD-10-CM

## 2015-07-29 DIAGNOSIS — Z5181 Encounter for therapeutic drug level monitoring: Secondary | ICD-10-CM | POA: Diagnosis not present

## 2015-07-29 LAB — POCT INR: INR: 2.3

## 2015-07-29 NOTE — Progress Notes (Addendum)
 Subjective:   Rodney [name] is a 75 y.o. male who presents for Medicare Annual/Subsequent preventive examination.  Review of Systems:  HRA assessment completed during visit; [place]  The Patient was informed that this wellness visit is to identify risk and educate on how to reduce risk for increase disease through lifestyle changes.   ROS deferred to the next CPE exam with the physician; the patient stated he did not want to make an apt for a CPE at this time. He stated he is closely followed by oncology and really did not feel he needed AWV but agreed to stay. 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 work in business)     Tobacco: quit in 1970;   ETOH no  Medication review/ Adherent; Reulimid 10mg qd and Zometa q 3 months  BMI: 28.2  Diet;   Eats breakfast sometimes; (lost from 240 to 202 intentionally)  Eats a lot of salads as well as wraps; bean; onions, a little bit of cheese;  Tobacco sauce; lettuce   Exercise;  Curl weights at work; 10 minutes several times a day Works out where he lives in fitness center; likes to stay strong; Bone pain keeps him from other activities;   Has constant lower back pain; and legs;  States Spine just fell "apart" at the beginning of myeloma  Rod and screws; Lots of back pain; can't lay in bed to sleep more than 2 to 3 hours. Pain wakes him up and he turns; has not tried to use pillows for  alignment;   Pain 1-10; declines to measure but describes pain is constant and acute; Lifting some weights helps him during the activity but hurts later; can't stand or sit to long.  Had a discussion regarding pain control; Declines "drugs" for pain. States he has to be in control;  Asked the patient to describe what would happen if he took something for pain. States he would have an auto accident and may hurt someone; he is accustomed to getting in his car and taking off and going anytime; had a long discussion regarding educating himself on types of pain meds and alternatives that may suit his lifestyle. Also pain med can be beneficial on long days planned or an option to help him rest better at hs.    HOME SAFETY; lives with spouse  No falls; recent fall;  unlevel surface on sidewalk / states he tries to be more careful Free standing shower; lives on 3rd floor of apt  LT: Plans to move to [place]; will be looking for ground level apt which is handicapped accessible   Community safety; YES/ [place]  Smoke detectors YES Firearms safety reviewed and will keep in a safe place if these exist.  Driving accidents this year: NO Advised to use sun protection or large brim hat/ does not use sun protection and advocated that he start.  Depression:   Denies feeling depressed or hopeless "Medical has questioned his sanity"  Doesn't know what depression is; states he stays busy; has faith and feels lucky to be alive;  Discusses life as a veteran; States he has to be in control and does stay busy;  Language congruent with [employer] experience and "personal drive".  Denies any thoughts of hurting himself   Goal; to go back to school   Cognitive; Reviewed the Ad8 score; 0; did not address specifically but stated he has no issues.   Gait assessment; walks when up but some difficulty getting out of chair  Mobilization and Functional losses from last year to this year? 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 3 hours; Discussed pain control   Urinary or fecal incontinence reviewed: no issues per the patient  Counseling Health Maintenance Colonoscopy; no hx; declines at this time   EKG: 04/2012 Hearing: 2000hz both ears   Ophthalmology exam: routinely, dry eyes; goes q 4 months; Last exam in  Feb; 2017 Cataracts removed; can't recall doctor but no mac degeneration; glaucoma; or other  Immunizations Due: Immunizations on hold; [facility] has given immunizations post Stem cell transplant; Will update the record; The patient states oncology has made sure he has all of his vaccinations and he is not due any at this time  Advanced Directive; NO; has discussed with kids; will fight to the end; not written and this is his choice  Health Recommendations and Referrals The patient is proactive in his health;   Barriers to Success Refusal to take pain meds; provided education.   Current Care Team reviewed and updated   Cardiac Risk Factors include: advanced age (>58mn, >>48women)     Objective:    Vitals: BP 110/60 mmHg  Pulse 56  Ht 5' 11" (1.803 m)  Wt 202 lb 2 oz (91.683 kg)  BMI 28.20 kg/m2  SpO2 96%  Body mass index is 28.2 kg/(m^2).  Tobacco History  Smoking status  . Former Smoker  . Quit date: 02/15/1969  Smokeless tobacco  . Never Used    Comment: quit 50 yrs     Counseling given: Yes   Past Medical History  Diagnosis Date  . Pancreatitis   . Pulmonary embolism ([facility]   . Complication of anesthesia 02-16-11    Pt. speaks of awakening during the surgery from back  surgery  . Shortness of breath 02-16-11    hx. Pulmonary emboli x2 (9 yrs/ 3'12 -last)  . Anemia 02-16-11    01-05-11- post surgery-Transfusions x 4 units  . Prostate cancer ([facility]   . Multiple myeloma 02-16-11    suspected tumor  left sacral  . Degenerative disc  disease 02-16-11    01-05-11 L3 fusion done  . Hernia 02-16-11    left inguinal hernia at present  . Bone pain 02/18/2013  . Second degree atrioventricular block 11/26/2012  . Esophageal reflux 05/31/2008    Qualifier: Diagnosis of  By: JArnoldo MoraleMD, JKramerhistory of traumatic fracture 07/10/2012  . Hypothyroidism 02/01/2014   Past Surgical History  Procedure Laterality Date  . Back surgery  02-16-11     01-05-11 Lumbar surgery L3(complicated by loss of blood volume)/ 01-09-11 then Lumbar fusion done with retained  hardware   . Cholecystectomy  04/06/2010    laparoscopic-inflammation with stones  . Inguinal hernia repair  02/20/2011    Procedure: HERNIA REPAIR INGUINAL ADULT;  Surgeon: Todd M Gerkin, MD;  Location: [facility];  Service: General;  Laterality: Left;  Repair Left Inguinal Hernia with Mesh  . Hernia repair  02/20/11    Inguinal hernia repair w/mesh  . Prostate surgery Bilateral 2011   Family History  Problem Relation Age of Onset  . Lymphoma Father 87  . Cancer Father   . Clotting disorder Mother     blood clots  . Cancer Brother     throat ca   History  Sexual Activity  . Sexual Activity: Yes    Outpatient Encounter Prescriptions as of 07/29/2015  Medication Sig  . calcium carbonate (TUMS - DOSED IN MG ELEMENTAL CALCIUM) 500 MG chewable tablet Chew 1 tablet by mouth every 2 (two) hours as needed. HEART BURN    . Cholecalciferol (VITAMIN D) 1000 UNITS capsule Take 5,000 Units by mouth daily.  . cyclobenzaprine (FLEXERIL) 10 MG tablet Take 1 tablet (10 mg total) by mouth 3 (three) times daily as needed for muscle spasms.  . KRILL OIL PO Take 1 tablet by mouth 2 (two) times daily.   . levothyroxine (SYNTHROID, LEVOTHROID) 100 MCG tablet TAKE ONE TABLET BY MOUTH ONCE DAILY. NEED APPOINTMENT  . methylcellulose (ARTIFICIAL TEARS) 1 % ophthalmic solution Place 2 drops into both eyes 4 (four) times daily as needed (dry eyes).   . omeprazole (PRILOSEC) 20 MG capsule  Take 20 mg by mouth 2 (two) times daily as needed. HEART BURN   . ondansetron (ZOFRAN) 4 MG tablet Take 1 tablet (4 mg total) by mouth every 8 (eight) hours as needed for nausea or vomiting.  . REVLIMID 10 MG capsule Take 10 mg by mouth daily. 21 days on and 7 days off  . warfarin (COUMADIN) 5 MG tablet Take as directed by anticoagulation clinic  . Glucosamine-Chondroitin (GLUCOSAMINE CHONDR COMPLEX PO) Take 1 tablet by mouth 2 (two) times daily. Reported on 07/29/2015   No facility-administered encounter medications on file as of 07/29/2015.    Activities of Daily Living In your present state of health, do you have any difficulty performing the following activities: 07/29/2015  Hearing? Y  Vision? N  Difficulty concentrating or making decisions? N  Walking or climbing stairs? Y  Dressing or bathing? N  Doing errands, shopping? N  Preparing Food and eating ? N  Using the Toilet? N  In the past six months, have you accidently leaked urine? N  Do you have problems with loss of bowel control? N  Managing your Medications? N  Managing your Finances? N  Housekeeping or managing your Housekeeping? N    Patient Care Team: Hannah R Kim, DO as PCP - General (Family Medicine) Ni Gorsuch, MD as Consulting Physician (Hematology and Oncology) Peter Voorhees, MD as Consulting Physician (Oncology)   Assessment:     Exercise Activities and Dietary recommendations Current Exercise Habits: Home exercise routine, Type of exercise: strength training/weights, Time (Minutes): 60, Frequency (Times/Week): 5, Weekly Exercise (Minutes/Week): 300, Intensity: Moderate  Goals    . patient     Desires to go back to school;       Fall Risk Fall Risk  01/27/2014  Falls in the past year? No   Depression Screen PHQ 2/9 Scores 01/27/2014  PHQ - 2 Score 0   Denies depression; states he knows he is lucky to be alive; Talks a lot about his life;  Very controlling by his own admission; Denies any SI;  states   he has faith and hope.    Cognitive Testing No flowsheet data found.  Reviewed the Ad8 score and was 0  Immunization History  Administered Date(s) Administered  . Influenza,inj,Quad PF,36+ Mos 11/18/2013  . Influenza-Unspecified 11/04/2014   Screening Tests Health Maintenance  Topic Date Due  . TETANUS/TDAP  09/17/1961  . COLONOSCOPY  09/17/1992  . ZOSTAVAX  09/18/2002  . PNA vac Low Risk Adult (1 of 2 - PCV13) 09/18/2007  . INFLUENZA VACCINE  10/04/2015      Plan:     [facility] has completed his vaccinations post transplant; is not due any at present; will try to update system with historical info provided by [facility] but did not see TD. Declined to have anyone manage vaccinations except transplant team / oncology.   During the course of the visit the patient was educated and counseled about the following appropriate screening and preventive services:   Vaccines to include Pneumoccal, Influenza, Hepatitis B, Td, Zostavax, HCV as noted   Electrocardiogram- deferred; no issues voiced  Cardiovascular Disease 04/2012  Colorectal cancer screening- declined  Diabetes screening/ neg  Prostate Cancer Screening/ hx prostate cancer / states he had surgery  Glaucoma screening/ neg per his admission; was not sure of the doctor  Nutrition counseling / lost weight intentionally; likes subs and wraps Smoking cessation counseling/ quit;   Patient Instructions (the written plan) was given to the patient.    Hauck,Susan, RN  07/29/2015    

## 2015-07-29 NOTE — Patient Instructions (Addendum)
Rodney [name] , Thank you for taking time to come for your Medicare Wellness Visit. I appreciate your ongoing commitment to your health goals. Please review the following plan we discussed and let me know if I can assist you in the future.   Declines any vaccinations Declines colonoscopy;  Declines annual exam with the doctor as he comes to have INR checked;   These are the goals we discussed: Goals    . patient     Desires to go back to school;        This is a list of the screening recommended for you and due dates:  Health Maintenance  Topic Date Due  . Tetanus Vaccine  09/17/1961  . Colon Cancer Screening  09/17/1992  . Shingles Vaccine  09/18/2002  . Pneumonia vaccines (1 of 2 - PCV13) 09/18/2007  . Flu Shot  10/04/2015   Health Maintenance, Male A healthy lifestyle and preventative care can promote health and wellness.  Maintain regular health, dental, and eye exams.  Eat a healthy diet. Foods like vegetables, fruits, whole grains, low-fat dairy products, and lean protein foods contain the nutrients you need and are low in calories. Decrease your intake of foods high in solid fats, added sugars, and salt. Get information about a proper diet from your health care provider, if necessary.  Regular physical exercise is one of the most important things you can do for your health. Most adults should get at least 150 minutes of moderate-intensity exercise (any activity that increases your heart rate and causes you to sweat) each week. In addition, most adults need muscle-strengthening exercises on 2 or more days a week.   Maintain a healthy weight. The body mass index (BMI) is a screening tool to identify possible weight problems. It provides an estimate of body fat based on height and weight. Your health care provider can find your BMI and can help you achieve or maintain a healthy weight. For males 20 years and older:  A BMI below 18.5 is considered underweight.  A BMI of 18.5  to 24.9 is normal.  A BMI of 25 to 29.9 is considered overweight.  A BMI of 30 and above is considered obese.  Maintain normal blood lipids and cholesterol by exercising and minimizing your intake of saturated fat. Eat a balanced diet with plenty of fruits and vegetables. Blood tests for lipids and cholesterol should begin at age 39 and be repeated every 5 years. If your lipid or cholesterol levels are high, you are over age 103, or you are at high risk for heart disease, you may need your cholesterol levels checked more frequently.Ongoing high lipid and cholesterol levels should be treated with medicines if diet and exercise are not working.  If you smoke, find out from your health care provider how to quit. If you do not use tobacco, do not start.  Lung cancer screening is recommended for adults aged 4-80 years who are at high risk for developing lung cancer because of a history of smoking. A yearly low-dose CT scan of the lungs is recommended for people who have at least a 30-pack-year history of smoking and are current smokers or have quit within the past 15 years. A pack year of smoking is smoking an average of 1 pack of cigarettes a day for 1 year (for example, a 30-pack-year history of smoking could mean smoking 1 pack a day for 30 years or 2 packs a day for 15 years). Yearly screening should continue until  the smoker has stopped smoking for at least 15 years. Yearly screening should be stopped for people who develop a health problem that would prevent them from having lung cancer treatment.  If you choose to drink alcohol, do not have more than 2 drinks per day. One drink is considered to be 12 oz (360 mL) of beer, 5 oz (150 mL) of wine, or 1.5 oz (45 mL) of liquor.  Avoid the use of street drugs. Do not share needles with anyone. Ask for help if you need support or instructions about stopping the use of drugs.  High blood pressure causes heart disease and increases the risk of stroke. High  blood pressure is more likely to develop in:  People who have blood pressure in the end of the normal range (100-139/85-89 mm Hg).  People who are overweight or obese.  People who are African American.  If you are 58-30 years of age, have your blood pressure checked every 3-5 years. If you are 34 years of age or older, have your blood pressure checked every year. You should have your blood pressure measured twice--once when you are at a hospital or clinic, and once when you are not at a hospital or clinic. Record the average of the two measurements. To check your blood pressure when you are not at a hospital or clinic, you can use:  An automated blood pressure machine at a pharmacy.  A home blood pressure monitor.  If you are 73-64 years old, ask your health care provider if you should take aspirin to prevent heart disease.  Diabetes screening involves taking a blood sample to check your fasting blood sugar level. This should be done once every 3 years after age 10 if you are at a normal weight and without risk factors for diabetes. Testing should be considered at a younger age or be carried out more frequently if you are overweight and have at least 1 risk factor for diabetes.  Colorectal cancer can be detected and often prevented. Most routine colorectal cancer screening begins at the age of 23 and continues through age 67. However, your health care provider may recommend screening at an earlier age if you have risk factors for colon cancer. On a yearly basis, your health care provider may provide home test kits to check for hidden blood in the stool. A small camera at the end of a tube may be used to directly examine the colon (sigmoidoscopy or colonoscopy) to detect the earliest forms of colorectal cancer. Talk to your health care provider about this at age 55 when routine screening begins. A direct exam of the colon should be repeated every 5-10 years through age 63, unless early forms of  precancerous polyps or small growths are found.  People who are at an increased risk for hepatitis B should be screened for this virus. You are considered at high risk for hepatitis B if:  You were born in a country where hepatitis B occurs often. Talk with your health care provider about which countries are considered high risk.  Your parents were born in a high-risk country and you have not received a shot to protect against hepatitis B (hepatitis B vaccine).  You have HIV or AIDS.  You use needles to inject street drugs.  You live with, or have sex with, someone who has hepatitis B.  You are a man who has sex with other men (MSM).  You get hemodialysis treatment.  You take certain medicines for conditions  like cancer, organ transplantation, and autoimmune conditions.  Hepatitis C blood testing is recommended for all people born from [birth year] through 1963 and any individual with known risk factors for hepatitis C.  Healthy men should no longer receive prostate-specific antigen (PSA) blood tests as part of routine cancer screening. Talk to your health care provider about prostate cancer screening.  Testicular cancer screening is not recommended for adolescents or adult males who have no symptoms. Screening includes self-exam, a health care provider exam, and other screening tests. Consult with your health care provider about any symptoms you have or any concerns you have about testicular cancer.  Practice safe sex. Use condoms and avoid high-risk sexual practices to reduce the spread of sexually transmitted infections (STIs).  You should be screened for STIs, including gonorrhea and chlamydia if:  You are sexually active and are younger than 24 years.  You are older than 24 years, and your health care provider tells you that you are at risk for this type of infection.  Your sexual activity has changed since you were last screened, and you are at an increased risk for chlamydia or  gonorrhea. Ask your health care provider if you are at risk.  If you are at risk of being infected with HIV, it is recommended that you take a prescription medicine daily to prevent HIV infection. This is called pre-exposure prophylaxis (PrEP). You are considered at risk if:  You are a man who has sex with other men (MSM).  You are a heterosexual man who is sexually active with multiple partners.  You take drugs by injection.  You are sexually active with a partner who has HIV.  Talk with your health care provider about whether you are at high risk of being infected with HIV. If you choose to begin PrEP, you should first be tested for HIV. You should then be tested every 3 months for as long as you are taking PrEP.  Use sunscreen. Apply sunscreen liberally and repeatedly throughout the day. You should seek shade when your shadow is shorter than you. Protect yourself by wearing long sleeves, pants, a wide-brimmed hat, and sunglasses year round whenever you are outdoors.  Tell your health care provider of new moles or changes in moles, especially if there is a change in shape or color. Also, tell your health care provider if a mole is larger than the size of a pencil eraser.  A one-time screening for abdominal aortic aneurysm (AAA) and surgical repair of large AAAs by ultrasound is recommended for men aged 73-75 years who are current or former smokers.  Stay current with your vaccines (immunizations).   This information is not intended to replace advice given to you by your health care provider. Make sure you discuss any questions you have with your health care provider.   Document Released: 08/18/2007 Document Revised: 03/12/2014 Document Reviewed: 07/17/2010 Elsevier Interactive Patient Education [employer].

## 2015-07-29 NOTE — Addendum Note (Signed)
Addended by: Elmer Picker on: 07/29/2015 12:03 PM   Modules accepted: Orders

## 2015-08-02 ENCOUNTER — Ambulatory Visit (INDEPENDENT_AMBULATORY_CARE_PROVIDER_SITE_OTHER): Payer: Medicare Other | Admitting: General Practice

## 2015-08-02 ENCOUNTER — Other Ambulatory Visit: Payer: Self-pay | Admitting: Family Medicine

## 2015-08-02 DIAGNOSIS — Z5181 Encounter for therapeutic drug level monitoring: Secondary | ICD-10-CM

## 2015-08-02 NOTE — Progress Notes (Signed)
Pre visit review using our clinic review tool, if applicable. No additional management support is needed unless otherwise documented below in the visit note. 

## 2015-08-03 ENCOUNTER — Ambulatory Visit: Payer: Medicare Other

## 2015-08-26 ENCOUNTER — Other Ambulatory Visit: Payer: Self-pay | Admitting: Family Medicine

## 2015-08-29 ENCOUNTER — Ambulatory Visit (INDEPENDENT_AMBULATORY_CARE_PROVIDER_SITE_OTHER): Payer: Medicare Other | Admitting: General Practice

## 2015-08-29 DIAGNOSIS — Z5181 Encounter for therapeutic drug level monitoring: Secondary | ICD-10-CM

## 2015-08-29 LAB — POCT INR: INR: 3.4

## 2015-08-29 NOTE — Progress Notes (Signed)
Pre visit review using our clinic review tool, if applicable. No additional management support is needed unless otherwise documented below in the visit note. 

## 2015-08-31 ENCOUNTER — Ambulatory Visit: Payer: Medicare Other

## 2015-09-01 ENCOUNTER — Ambulatory Visit (INDEPENDENT_AMBULATORY_CARE_PROVIDER_SITE_OTHER): Payer: Medicare Other

## 2015-09-01 ENCOUNTER — Ambulatory Visit (INDEPENDENT_AMBULATORY_CARE_PROVIDER_SITE_OTHER): Payer: Medicare Other | Admitting: Podiatry

## 2015-09-01 ENCOUNTER — Encounter: Payer: Self-pay | Admitting: Podiatry

## 2015-09-01 VITALS — BP 148/94 | HR 75 | Resp 16

## 2015-09-01 DIAGNOSIS — M79671 Pain in right foot: Secondary | ICD-10-CM | POA: Diagnosis not present

## 2015-09-01 DIAGNOSIS — M779 Enthesopathy, unspecified: Secondary | ICD-10-CM | POA: Diagnosis not present

## 2015-09-01 MED ORDER — TRIAMCINOLONE ACETONIDE 10 MG/ML IJ SUSP
10.0000 mg | Freq: Once | INTRAMUSCULAR | Status: AC
  Administered 2015-09-01: 10 mg

## 2015-09-01 NOTE — Progress Notes (Signed)
   Subjective:    Patient ID: Rodney [name], male    DOB: [birth year], 75 y.o.   MRN: [id number]  HPI    Review of Systems  Hematological: Bruises/bleeds easily.  All other systems reviewed and are negative.      Objective:   Physical Exam        Assessment & Plan:

## 2015-09-01 NOTE — Progress Notes (Signed)
Subjective:     Patient ID: Rodney [name], male   DOB: 11-Jan-1941, 75 y.o.   MRN: [id number]  HPI patient presents stating that he's developed a lot of pain on top of his right foot and he got new shoes a month ago and the pain seems to have gotten worse since then. States he does not remember an injury   Review of Systems  All other systems reviewed and are negative.      Objective:   Physical Exam  Constitutional: He is oriented to person, place, and time.  Cardiovascular: Intact distal pulses.   Musculoskeletal: Normal range of motion.  Neurological: He is oriented to person, place, and time.  Skin: Skin is warm.  Nursing note and vitals reviewed.  neurovascular status intact muscle strength adequate range of motion within normal limits with patient found to have discomfort around the dorsum and dorsal medial aspect of the right ankle with anterior tibial function is normal and pain more medial to this area. Patient has some swelling in this area but no indication of tendon dysfunction. Patient's found to have good digital perfusion and is well oriented 3     Assessment:     Inflammatory tendinitis or ligament irritation of the medial right ankle just medial to the anterior tibial tendon    Plan:     H&P and x-rays reviewed. I did a careful sheath injection right 3 mg Kenalog 5 mill grams Xylocaine to reduce inflammation and discussed utilization of brace to lift the arch up. Patient will be seen back to recheck  X-ray report indicated spur formation but negative for signs stress fracture or arthritis

## 2015-09-06 ENCOUNTER — Other Ambulatory Visit: Payer: Self-pay | Admitting: Family Medicine

## 2015-09-13 ENCOUNTER — Encounter: Payer: Self-pay | Admitting: Podiatry

## 2015-09-13 ENCOUNTER — Ambulatory Visit (INDEPENDENT_AMBULATORY_CARE_PROVIDER_SITE_OTHER): Payer: Medicare Other | Admitting: Podiatry

## 2015-09-13 DIAGNOSIS — M779 Enthesopathy, unspecified: Secondary | ICD-10-CM | POA: Diagnosis not present

## 2015-09-13 MED ORDER — TRIAMCINOLONE ACETONIDE 10 MG/ML IJ SUSP
10.0000 mg | Freq: Once | INTRAMUSCULAR | Status: AC
  Administered 2015-09-13: 10 mg

## 2015-09-13 NOTE — Progress Notes (Signed)
Subjective:     Patient ID: Rodney [name], male   DOB: Oct 03, 1940, 75 y.o.   MRN: [id number]  HPI patient presents with continued right foot pain right and states that when he wears the shoes he has on now has minimal discomfort but other shoes seem to bother him   Review of Systems     Objective:   Physical Exam Neurovascular status intact muscle strength adequate with discomfort in the right foot between the anterior tibial and posterior tibial tendon without active tendon involvement    Assessment:     Inflammatory changes with ligament issues right medial foot    Plan:     Discussed possibility for immobilization for boot and patient does not want that yet so I went ahead reinjected the area with 3 mg Kenalog 5 mill grams Xylocaine and advised on ice therapy and wearing the shoes that do not hurt. If symptoms persist over the next few weeks and reappoint

## 2015-09-15 ENCOUNTER — Ambulatory Visit: Payer: TRICARE For Life (TFL) | Admitting: Podiatry

## 2015-09-26 ENCOUNTER — Ambulatory Visit (INDEPENDENT_AMBULATORY_CARE_PROVIDER_SITE_OTHER): Payer: Medicare Other | Admitting: Family Medicine

## 2015-09-26 ENCOUNTER — Encounter: Payer: Self-pay | Admitting: Family Medicine

## 2015-09-26 ENCOUNTER — Ambulatory Visit (INDEPENDENT_AMBULATORY_CARE_PROVIDER_SITE_OTHER): Payer: Medicare Other | Admitting: General Practice

## 2015-09-26 VITALS — BP 120/70 | HR 61 | Temp 97.9°F | Ht 71.0 in | Wt 197.9 lb

## 2015-09-26 DIAGNOSIS — Z5181 Encounter for therapeutic drug level monitoring: Secondary | ICD-10-CM | POA: Diagnosis not present

## 2015-09-26 DIAGNOSIS — K529 Noninfective gastroenteritis and colitis, unspecified: Secondary | ICD-10-CM

## 2015-09-26 LAB — POCT INR: INR: 2

## 2015-09-26 NOTE — Progress Notes (Signed)
Pre visit review using our clinic review tool, if applicable. No additional management support is needed unless otherwise documented below in the visit note. 

## 2015-09-26 NOTE — Patient Instructions (Signed)
BEFORE YOU LEAVE: -coumadin clinic check  -plenty of fluids with electrolytes, bland diet - avoid dairy for 1 week  -zofran as needed for nausea, vomiting  -imodium for diarrhea  -follow up if symptoms persist or worsen

## 2015-09-26 NOTE — Progress Notes (Signed)
HPI:  Acute visit for nausea and vomiting: -started 4 days ago -symptoms include acute onset nausea, few episode non bilious, nonbloody emesis, watery diarrhea -no improving, stool firmer today and no vomiting since 2 days ago -tolerating po intake -denies: fevers, abd pain, inability to tolerate PO intake, hematochezia, melena, recent travel, recent abx, SOB, CP  ROS: See pertinent positives and negatives per HPI.  Past Medical History:  Diagnosis Date  . Anemia 02-16-11   01-05-11- post surgery-Transfusions x 4 units  . Bone pain 02/18/2013  . Complication of anesthesia 02-16-11   Pt. speaks of awakening during the surgery from back  surgery  . Degenerative disc disease 02-16-11   01-05-11 L3 fusion done  . Esophageal reflux 05/31/2008   Qualifier: Diagnosis of  By: Arnoldo Morale MD, Balinda Quails   . Hernia 02-16-11   left inguinal hernia at present  . Hypothyroidism 02/01/2014  . Multiple myeloma 02-16-11   suspected tumor  left sacral  . Pancreatitis   . Personal history of traumatic fracture 07/10/2012  . Prostate cancer ([place])   . Pulmonary embolism ([place])   . Second degree atrioventricular block 11/26/2012  . Shortness of breath 02-16-11   hx. Pulmonary emboli x2 (9 yrs/ 3'12 -last)    Past Surgical History:  Procedure Laterality Date  . BACK SURGERY  02-16-11    01-05-11 Lumbar surgery L3(complicated by loss of blood volume)/ 01-09-11 then Lumbar fusion done with retained  hardware   . CHOLECYSTECTOMY  04/06/2010   laparoscopic-inflammation with stones  . HERNIA REPAIR  02/20/11   Inguinal hernia repair w/mesh  . INGUINAL HERNIA REPAIR  02/20/2011   Procedure: HERNIA REPAIR INGUINAL ADULT;  Surgeon: Earnstine Regal, MD;  Location: [facility];  Service: General;  Laterality: Left;  Repair Left Inguinal Hernia with Mesh  . PROSTATE SURGERY Bilateral 2011    Family History  Problem Relation Age of Onset  . Lymphoma Father 27  . Cancer Father   . Clotting disorder Mother     blood clots   . Cancer Brother     throat ca    Social History   Social History  . Marital status: Married    Spouse name: N/A  . Number of children: N/A  . Years of education: N/A   Social History Main Topics  . Smoking status: Former Smoker    Quit date: 02/15/1969  . Smokeless tobacco: Never Used     Comment: quit 50 yrs  . Alcohol use No  . Drug use: No  . Sexual activity: Yes   Other Topics Concern  . None   Social History Narrative   Married   Regular exercise-yes     Current Outpatient Prescriptions:  .  calcium carbonate (TUMS - DOSED IN MG ELEMENTAL CALCIUM) 500 MG chewable tablet, Chew 1 tablet by mouth every 2 (two) hours as needed. HEART BURN  , Disp: , Rfl:  .  Cholecalciferol (VITAMIN D) 1000 UNITS capsule, Take 5,000 Units by mouth daily., Disp: , Rfl:  .  cyclobenzaprine (FLEXERIL) 10 MG tablet, Take 1 tablet (10 mg total) by mouth 3 (three) times daily as needed for muscle spasms., Disp: 30 tablet, Rfl: 3 .  Glucosamine-Chondroitin (GLUCOSAMINE CHONDR COMPLEX PO), Take 1 tablet by mouth 2 (two) times daily. Reported on 07/29/2015, Disp: , Rfl:  .  KRILL OIL PO, Take 1 tablet by mouth 2 (two) times daily. , Disp: , Rfl:  .  levothyroxine (SYNTHROID, LEVOTHROID) 100 MCG tablet, TAKE ONE TABLET BY  MOUTH ONCE DAILY. NEED TO MAKE APPOINTMENT, Disp: 30 tablet, Rfl: 0 .  methylcellulose (ARTIFICIAL TEARS) 1 % ophthalmic solution, Place 2 drops into both eyes 4 (four) times daily as needed (dry eyes). , Disp: , Rfl:  .  omeprazole (PRILOSEC) 20 MG capsule, Take 20 mg by mouth 2 (two) times daily as needed. HEART BURN , Disp: , Rfl:  .  ondansetron (ZOFRAN) 4 MG tablet, Take 1 tablet (4 mg total) by mouth every 8 (eight) hours as needed for nausea or vomiting., Disp: 20 tablet, Rfl: 0 .  REVLIMID 10 MG capsule, Take 10 mg by mouth daily. 21 days on and 7 days off, Disp: , Rfl:  .  warfarin (COUMADIN) 5 MG tablet, TAKE BY MOUTH AS DIRECTED BY ANTICOAGULATION CLINIC, Disp: 30  tablet, Rfl: 0  EXAM:  Vitals:   09/26/15 0913  BP: 120/70  Pulse: 61  Temp: 97.9 F (36.6 C)    Body mass index is 27.6 kg/m.  GENERAL: vitals reviewed and listed above, alert, oriented, appears well hydrated and in no acute distress  HEENT: atraumatic, conjunttiva clear, no obvious abnormalities on inspection of external nose and ears  NECK: no obvious masses on inspection  LUNGS: clear to auscultation bilaterally, no wheezes, rales or rhonchi, good air movement  CV: HRRR, no peripheral edema  ABD: BS+, soft, NTTP  MS: moves all extremities without noticeable abnormality  PSYCH: pleasant and cooperative, no obvious depression or anxiety  ASSESSMENT AND PLAN:  Discussed the following assessment and plan:  Gastroenteritis  -likely gastroenteritis given symptoms and now resolving -he has zofran at home and feels tolerating PO intake well -advised labs given his hx, he declined -advised prompt follow up with labs and further eval if persists or worsens  -Patient advised to return or notify a doctor immediately if symptoms worsen or persist or new concerns arise.  Patient Instructions  BEFORE YOU LEAVE: -coumadin clinic check  -plenty of fluids with electrolytes, bland diet - avoid dairy for 1 week  -zofran as needed for nausea, vomiting  -imodium for diarrhea  -follow up if symptoms persist or worsen   Colin Benton R., DO

## 2015-09-27 ENCOUNTER — Ambulatory Visit: Payer: Medicare Other | Admitting: Hematology and Oncology

## 2015-09-27 ENCOUNTER — Telehealth: Payer: Self-pay | Admitting: Hematology and Oncology

## 2015-09-27 ENCOUNTER — Other Ambulatory Visit: Payer: Medicare Other

## 2015-09-27 NOTE — Telephone Encounter (Signed)
Call to confirm appointment. Left message. Appointment letter and schedule mailed. Merleen Nicely.

## 2015-09-29 ENCOUNTER — Other Ambulatory Visit: Payer: Self-pay | Admitting: Family Medicine

## 2015-09-29 DIAGNOSIS — C9000 Multiple myeloma not having achieved remission: Secondary | ICD-10-CM | POA: Diagnosis not present

## 2015-09-30 ENCOUNTER — Ambulatory Visit (INDEPENDENT_AMBULATORY_CARE_PROVIDER_SITE_OTHER): Payer: Medicare Other | Admitting: Family Medicine

## 2015-09-30 ENCOUNTER — Encounter: Payer: Self-pay | Admitting: Family Medicine

## 2015-09-30 DIAGNOSIS — M79671 Pain in right foot: Secondary | ICD-10-CM | POA: Diagnosis not present

## 2015-09-30 NOTE — Patient Instructions (Signed)
You have a severe extensor tendinitis (specifically of your anterior tibialis and extensor hallucis tendons). I would rest this with the boot when up and walking around for next 2 weeks. Icing 15 minutes at a time 3-4 times a day. Continue with the tylenol. Try topical medications too (like capsaicin or biofreeze) up to 4 times a day. I would not repeat an injection for this (you've had 2 and they haven't helped). We can consider physical therapy and/or nitroglycerin patches in the future if you're not improving as expected. Follow up with [place] in 2 weeks but you can also call us at that time to let us know how you're doing.

## 2015-10-03 ENCOUNTER — Ambulatory Visit (HOSPITAL_BASED_OUTPATIENT_CLINIC_OR_DEPARTMENT_OTHER): Payer: Medicare Other | Admitting: Hematology and Oncology

## 2015-10-03 ENCOUNTER — Encounter: Payer: Self-pay | Admitting: Hematology and Oncology

## 2015-10-03 ENCOUNTER — Other Ambulatory Visit: Payer: Medicare Other

## 2015-10-03 ENCOUNTER — Telehealth: Payer: Self-pay | Admitting: Family Medicine

## 2015-10-03 ENCOUNTER — Ambulatory Visit (HOSPITAL_BASED_OUTPATIENT_CLINIC_OR_DEPARTMENT_OTHER): Payer: Medicare Other

## 2015-10-03 ENCOUNTER — Telehealth: Payer: Self-pay | Admitting: Hematology and Oncology

## 2015-10-03 DIAGNOSIS — C9001 Multiple myeloma in remission: Secondary | ICD-10-CM | POA: Diagnosis not present

## 2015-10-03 DIAGNOSIS — I2782 Chronic pulmonary embolism: Secondary | ICD-10-CM

## 2015-10-03 DIAGNOSIS — Z9484 Stem cells transplant status: Secondary | ICD-10-CM

## 2015-10-03 DIAGNOSIS — M7752 Other enthesopathy of left foot: Secondary | ICD-10-CM | POA: Insufficient documentation

## 2015-10-03 DIAGNOSIS — M779 Enthesopathy, unspecified: Secondary | ICD-10-CM

## 2015-10-03 MED ORDER — ZOLEDRONIC ACID 4 MG/100ML IV SOLN
4.0000 mg | Freq: Once | INTRAVENOUS | Status: AC
  Administered 2015-10-03: 4 mg via INTRAVENOUS
  Filled 2015-10-03: qty 100

## 2015-10-03 MED ORDER — SODIUM CHLORIDE 0.9 % IV SOLN
Freq: Once | INTRAVENOUS | Status: AC
  Administered 2015-10-03: 15:00:00 via INTRAVENOUS

## 2015-10-03 NOTE — Progress Notes (Signed)
[facility] OFFICE PROGRESS NOTE  Patient Care Team: Lucretia Kern, DO as PCP - General (Family Medicine) Heath Lark, MD as Consulting Physician (Hematology and Oncology) Terrilyn Saver, MD as Consulting Physician (Oncology)  SUMMARY OF ONCOLOGIC HISTORY:  The patient was initially diagnosed in 2012. He had chemotherapy followed by autologous stem cell transplant [place] around October 2013. In April 2014 he was started on maintenance Revlimid 10 mg daily by mouth along with monthly Zometa. He receives his Revlimid through his physician from [place]. He is on warfarin therapy indefinitely, INR monitored by other physicians Starting on 05/18/2013, his Zometa is change to every 3 months.  INTERVAL HISTORY: Please see below for problem oriented charting. He denies recent dental issue. He went to [place] last week to see his physician there and was told he remained in remission. No recent bone pain. He had recent tendinitis be managed conservatively. Denies recent infection. The patient denies any recent signs or symptoms of bleeding such as spontaneous epistaxis, hematuria or hematochezia.   REVIEW OF SYSTEMS:   Constitutional: Denies fevers, chills or abnormal weight loss Eyes: Denies blurriness of vision Ears, nose, mouth, throat, and face: Denies mucositis or sore throat Respiratory: Denies cough, dyspnea or wheezes Cardiovascular: Denies palpitation, chest discomfort or lower extremity swelling Gastrointestinal:  Denies nausea, heartburn or change in bowel habits Skin: Denies abnormal skin rashes Lymphatics: Denies new lymphadenopathy or easy bruising Neurological:Denies numbness, tingling or new weaknesses Behavioral/Psych: Mood is stable, no new changes  All other systems were reviewed with the patient and are negative.  I have reviewed the past medical history, past surgical history, social history and family history with the  patient and they are unchanged from previous note.  ALLERGIES:  is allergic to blood-group specific substance.  MEDICATIONS:  Current Outpatient Prescriptions  Medication Sig Dispense Refill  . calcium carbonate (TUMS - DOSED IN MG ELEMENTAL CALCIUM) 500 MG chewable tablet Chew 1 tablet by mouth every 2 (two) hours as needed. HEART BURN      . Cholecalciferol (VITAMIN D) 1000 UNITS capsule Take 5,000 Units by mouth daily.    . cyclobenzaprine (FLEXERIL) 10 MG tablet Take 1 tablet (10 mg total) by mouth 3 (three) times daily as needed for muscle spasms. 30 tablet 3  . Glucosamine-Chondroitin (GLUCOSAMINE CHONDR COMPLEX PO) Take 1 tablet by mouth 2 (two) times daily. Reported on 07/29/2015    . KRILL OIL PO Take 1 tablet by mouth 2 (two) times daily.     Marland Kitchen levothyroxine (SYNTHROID, LEVOTHROID) 100 MCG tablet TAKE ONE TABLET BY MOUTH ONCE DAILY. NEED TO MAKE APPOINTMENT 30 tablet 0  . methylcellulose (ARTIFICIAL TEARS) 1 % ophthalmic solution Place 2 drops into both eyes 4 (four) times daily as needed (dry eyes).     Marland Kitchen omeprazole (PRILOSEC) 20 MG capsule Take 20 mg by mouth 2 (two) times daily as needed. HEART BURN     . ondansetron (ZOFRAN) 4 MG tablet Take 1 tablet (4 mg total) by mouth every 8 (eight) hours as needed for nausea or vomiting. 20 tablet 0  . REVLIMID 10 MG capsule Take 10 mg by mouth daily. 21 days on and 7 days off    . warfarin (COUMADIN) 5 MG tablet TAKE BY MOUTH AS DIRECTED BY ANTICOAGULATION CLINIC 30 tablet 3   No current facility-administered medications for this visit.     PHYSICAL EXAMINATION: ECOG PERFORMANCE STATUS: 0 - Asymptomatic  Vitals:   10/03/15 1339  BP: (!) 111/59  Pulse: 63  Resp: 17  Temp: 98.8 F (37.1 C)   Filed Weights   10/03/15 1339  Weight: 205 lb 14.4 oz (93.4 kg)    GENERAL:alert, no distress and comfortable SKIN: skin color, texture, turgor are normal, no rashes or significant lesions EYES: normal, Conjunctiva are pink and  non-injected, sclera clear OROPHARYNX:no exudate, no erythema and lips, buccal mucosa, and tongue normal  NECK: supple, thyroid normal size, non-tender, without nodularity LYMPH:  no palpable lymphadenopathy in the cervical, axillary or inguinal LUNGS: clear to auscultation and percussion with normal breathing effort HEART: regular rate & rhythm and no murmurs With mild left ankle edema ABDOMEN:abdomen soft, non-tender and normal bowel sounds Musculoskeletal:no cyanosis of digits and no clubbing  NEURO: alert & oriented x 3 with fluent speech, no focal motor/sensory deficits  LABORATORY DATA:  I have reviewed the data as listed    Component Value Date/Time   NA 140 11/18/2013 0756   K 4.0 11/18/2013 0756   CL 112 (H) 08/27/2012 1309   CO2 20 (L) 11/18/2013 0756   GLUCOSE 88 11/18/2013 0756   GLUCOSE 100 (H) 08/27/2012 1309   BUN 18.0 11/18/2013 0756   CREATININE 1.3 11/18/2013 0756   CALCIUM 8.7 11/18/2013 0756   PROT 7.0 11/18/2013 0756   ALBUMIN 3.4 (L) 11/18/2013 0756   AST 23 11/18/2013 0756   ALT 33 11/18/2013 0756   ALKPHOS 83 11/18/2013 0756   BILITOT 0.59 11/18/2013 0756   GFRNONAA 57 (L) 04/06/2012 1950   GFRAA 66 (L) 04/06/2012 1950    No results found for: SPEP, UPEP  Lab Results  Component Value Date   WBC 5.9 11/18/2013   NEUTROABS 3.4 11/18/2013   HGB 14.4 11/18/2013   HCT 43.9 11/18/2013   MCV 95.0 11/18/2013   PLT 181 11/18/2013      Chemistry      Component Value Date/Time   NA 140 11/18/2013 0756   K 4.0 11/18/2013 0756   CL 112 (H) 08/27/2012 1309   CO2 20 (L) 11/18/2013 0756   BUN 18.0 11/18/2013 0756   CREATININE 1.3 11/18/2013 0756      Component Value Date/Time   CALCIUM 8.7 11/18/2013 0756   ALKPHOS 83 11/18/2013 0756   AST 23 11/18/2013 0756   ALT 33 11/18/2013 0756   BILITOT 0.59 11/18/2013 0756      ASSESSMENT & PLAN:  Multiple myeloma in remission (HCC)  According to the patient, he had labs drawn in [place] last week and  was told that his blood work were fine and he is responding well to treatment. The plan would be to continue Revlimid indefinitely and Zometa every 3 months. He has recent dental clearance with no concerns for osteonecrosis of the jaw. He will continue calcium with vitamin D supplements. He gets Revlimid through his oncologist in [place]  Pulmonary embolism He will continue on Coumadin, managed by primary care doctor. The patient denies any recent signs or symptoms of bleeding such as spontaneous epistaxis, hematuria or hematochezia.  Tendonitis of ankle, left He had recent tendinitis. This is being managed conservatively through orthopedics/sports medicine   No orders of the defined types were placed in this encounter.  All questions were answered. The patient knows to call the clinic with any problems, questions or concerns. No barriers to learning was detected. I spent 15 minutes counseling the patient face to face. The total time spent in the appointment was 20 minutes and more than 50% was on  counseling and review of test results     [facility], Seletha Zimmermann, MD 10/03/2015 1:58 PM

## 2015-10-03 NOTE — Assessment & Plan Note (Signed)
According to the patient, he had labs drawn in [place] last week and was told that his blood work were fine and he is responding well to treatment. The plan would be to continue Revlimid indefinitely and Zometa every 3 months. He has recent dental clearance with no concerns for osteonecrosis of the jaw. He will continue calcium with vitamin D supplements. He gets Revlimid through his oncologist in [place]

## 2015-10-03 NOTE — Telephone Encounter (Signed)
Gave pt cal & avs °

## 2015-10-03 NOTE — Patient Instructions (Signed)

## 2015-10-03 NOTE — Assessment & Plan Note (Signed)
He will continue on Coumadin, managed by primary care doctor. The patient denies any recent signs or symptoms of bleeding such as spontaneous epistaxis, hematuria or hematochezia.

## 2015-10-03 NOTE — Assessment & Plan Note (Signed)
He had recent tendinitis. This is being managed conservatively through orthopedics/sports medicine

## 2015-10-03 NOTE — Telephone Encounter (Signed)
Unable to contact patient.    Will try again.

## 2015-10-04 ENCOUNTER — Ambulatory Visit: Payer: Medicare Other

## 2015-10-05 DIAGNOSIS — M79671 Pain in right foot: Secondary | ICD-10-CM | POA: Insufficient documentation

## 2015-10-05 NOTE — Assessment & Plan Note (Signed)
2/2 extensor tendinitis (anterior tibialis and extensor hallucis).  Cam walker to rest this.  Icing, tylenol, topical medications.  Encouraged to consider physical therapy, nitro patches if not improving.  F/u in 2 weeks.

## 2015-10-05 NOTE — Progress Notes (Signed)
PCP: Lucretia Kern., DO  Subjective:   HPI: Patient is a 75 y.o. male here for right foot pain.  Patient reports about 2 weeks of dorsal right foot pain. Pain is sharp, 6/10, worse with walking. Started after buying new shoes that he got to help treat his sciatica. Works at [employer] 8-9 hours a day. Taking tylenol as needed for pain. Had injections given by podiatry twice without benefit. Was resistant to wearing boot. No skin changes, numbness.  Past Medical History:  Diagnosis Date  . Anemia 02-16-11   01-05-11- post surgery-Transfusions x 4 units  . Bone pain 02/18/2013  . Complication of anesthesia 02-16-11   Pt. speaks of awakening during the surgery from back  surgery  . Degenerative disc disease 02-16-11   01-05-11 L3 fusion done  . Esophageal reflux 05/31/2008   Qualifier: Diagnosis of  By: Arnoldo Morale MD, Balinda Quails   . Hernia 02-16-11   left inguinal hernia at present  . Hypothyroidism 02/01/2014  . Multiple myeloma 02-16-11   suspected tumor  left sacral  . Pancreatitis   . Personal history of traumatic fracture 07/10/2012  . Prostate cancer ([place])   . Pulmonary embolism ([place])   . Second degree atrioventricular block 11/26/2012  . Shortness of breath 02-16-11   hx. Pulmonary emboli x2 (9 yrs/ 3'12 -last)    Current Outpatient Prescriptions on File Prior to Visit  Medication Sig Dispense Refill  . calcium carbonate (TUMS - DOSED IN MG ELEMENTAL CALCIUM) 500 MG chewable tablet Chew 1 tablet by mouth every 2 (two) hours as needed. HEART BURN      . Cholecalciferol (VITAMIN D) 1000 UNITS capsule Take 5,000 Units by mouth daily.    . cyclobenzaprine (FLEXERIL) 10 MG tablet Take 1 tablet (10 mg total) by mouth 3 (three) times daily as needed for muscle spasms. 30 tablet 3  . Glucosamine-Chondroitin (GLUCOSAMINE CHONDR COMPLEX PO) Take 1 tablet by mouth 2 (two) times daily. Reported on 07/29/2015    . KRILL OIL PO Take 1 tablet by mouth 2 (two) times daily.     Marland Kitchen levothyroxine  (SYNTHROID, LEVOTHROID) 100 MCG tablet TAKE ONE TABLET BY MOUTH ONCE DAILY. NEED TO MAKE APPOINTMENT 30 tablet 0  . methylcellulose (ARTIFICIAL TEARS) 1 % ophthalmic solution Place 2 drops into both eyes 4 (four) times daily as needed (dry eyes).     Marland Kitchen omeprazole (PRILOSEC) 20 MG capsule Take 20 mg by mouth 2 (two) times daily as needed. HEART BURN     . ondansetron (ZOFRAN) 4 MG tablet Take 1 tablet (4 mg total) by mouth every 8 (eight) hours as needed for nausea or vomiting. 20 tablet 0  . REVLIMID 10 MG capsule Take 10 mg by mouth daily. 21 days on and 7 days off    . warfarin (COUMADIN) 5 MG tablet TAKE BY MOUTH AS DIRECTED BY ANTICOAGULATION CLINIC 30 tablet 3   No current facility-administered medications on file prior to visit.     Past Surgical History:  Procedure Laterality Date  . BACK SURGERY  02-16-11    01-05-11 Lumbar surgery L3(complicated by loss of blood volume)/ 01-09-11 then Lumbar fusion done with retained  hardware   . CHOLECYSTECTOMY  04/06/2010   laparoscopic-inflammation with stones  . HERNIA REPAIR  02/20/11   Inguinal hernia repair w/mesh  . INGUINAL HERNIA REPAIR  02/20/2011   Procedure: HERNIA REPAIR INGUINAL ADULT;  Surgeon: Earnstine Regal, MD;  Location: [facility];  Service: General;  Laterality: Left;  Repair  Left Inguinal Hernia with Mesh  . PROSTATE SURGERY Bilateral 2011    Allergies  Allergen Reactions  . Blood-Group Specific Substance     Must be Leukocyte Reduced and Irradiated due to stem cell transplant.     Social History   Social History  . Marital status: Married    Spouse name: N/A  . Number of children: N/A  . Years of education: N/A   Occupational History  . Not on file.   Social History Main Topics  . Smoking status: Former Smoker    Quit date: 02/15/1969  . Smokeless tobacco: Never Used     Comment: quit 50 yrs  . Alcohol use No  . Drug use: No  . Sexual activity: Yes   Other Topics Concern  . Not on file   Social History  Narrative   Married   Regular exercise-yes    Family History  Problem Relation Age of Onset  . Lymphoma Father 36  . Cancer Father   . Clotting disorder Mother     blood clots  . Cancer Brother     throat ca    BP 113/76   Pulse 61   Ht _0  (1.803 m)   Wt 205 lb (93 kg)   BMI 28.59 kg/m   Review of Systems: See HPI above.    Objective:  Physical Exam:  Gen: NAD, comfortable in exam room  Right foot/ankle: No gross deformity, swelling, ecchymoses FROM ankle and digits. TTP over anterior tibialis, extensor hallucis.  No other tenderness of foot or ankle. Negative ant drawer and talar tilt.   Negative metatarsal squeeze. Negative syndesmotic compression. Thompsons test negative. NV intact distally.  Left foot/ankle: FROM without pain.  MSK u/s right foot:  Significant thickening of anterior tibialis tendon, mild thickening extensor hallucis.  Target sign of extensor hallucis.  No tearing of tendons.    Assessment & Plan:  1. Right foot pain - 2/2 extensor tendinitis (anterior tibialis and extensor hallucis).  Cam walker to rest this.  Icing, tylenol, topical medications.  Encouraged to consider physical therapy, nitro patches if not improving.  F/u in 2 weeks.

## 2015-10-06 ENCOUNTER — Encounter (HOSPITAL_COMMUNITY): Payer: Self-pay | Admitting: Emergency Medicine

## 2015-10-06 ENCOUNTER — Ambulatory Visit (INDEPENDENT_AMBULATORY_CARE_PROVIDER_SITE_OTHER): Payer: Medicare Other

## 2015-10-06 ENCOUNTER — Emergency Department (HOSPITAL_BASED_OUTPATIENT_CLINIC_OR_DEPARTMENT_OTHER)
Admit: 2015-10-06 | Discharge: 2015-10-06 | Disposition: A | Discharge status: Home / Self Care | Payer: Medicare Other | Attending: Emergency Medicine | Admitting: Emergency Medicine

## 2015-10-06 ENCOUNTER — Ambulatory Visit (INDEPENDENT_AMBULATORY_CARE_PROVIDER_SITE_OTHER): Payer: Medicare Other | Admitting: Podiatry

## 2015-10-06 ENCOUNTER — Emergency Department (HOSPITAL_COMMUNITY)
Admission: EM | Admit: 2015-10-06 | Discharge: 2015-10-06 | Disposition: A | Discharge status: Home / Self Care | Payer: Medicare Other | Attending: Emergency Medicine | Admitting: Emergency Medicine

## 2015-10-06 DIAGNOSIS — Z7901 Long term (current) use of anticoagulants: Secondary | ICD-10-CM | POA: Diagnosis not present

## 2015-10-06 DIAGNOSIS — M79671 Pain in right foot: Secondary | ICD-10-CM

## 2015-10-06 DIAGNOSIS — M7989 Other specified soft tissue disorders: Secondary | ICD-10-CM

## 2015-10-06 DIAGNOSIS — R600 Localized edema: Secondary | ICD-10-CM | POA: Diagnosis not present

## 2015-10-06 DIAGNOSIS — Z8546 Personal history of malignant neoplasm of prostate: Secondary | ICD-10-CM | POA: Diagnosis not present

## 2015-10-06 DIAGNOSIS — M779 Enthesopathy, unspecified: Secondary | ICD-10-CM

## 2015-10-06 DIAGNOSIS — M79609 Pain in unspecified limb: Secondary | ICD-10-CM | POA: Diagnosis not present

## 2015-10-06 DIAGNOSIS — Z87891 Personal history of nicotine dependence: Secondary | ICD-10-CM | POA: Diagnosis not present

## 2015-10-06 DIAGNOSIS — Z79899 Other long term (current) drug therapy: Secondary | ICD-10-CM | POA: Insufficient documentation

## 2015-10-06 DIAGNOSIS — R609 Edema, unspecified: Secondary | ICD-10-CM | POA: Diagnosis not present

## 2015-10-06 DIAGNOSIS — E039 Hypothyroidism, unspecified: Secondary | ICD-10-CM | POA: Diagnosis not present

## 2015-10-06 DIAGNOSIS — M799 Soft tissue disorder, unspecified: Secondary | ICD-10-CM | POA: Diagnosis present

## 2015-10-06 LAB — PROTIME-INR
INR: 2.65
PROTHROMBIN TIME: 28.8 s — AB (ref 11.4–15.2)

## 2015-10-06 MED ORDER — CEPHALEXIN 500 MG PO CAPS: 500.0000 mg | ORAL_CAPSULE | Freq: Four times a day (QID) | ORAL | 0 refills | Status: DC

## 2015-10-06 MED ORDER — CEPHALEXIN 500 MG PO CAPS
500.0000 mg | ORAL_CAPSULE | Freq: Once | ORAL | Status: AC
  Administered 2015-10-06: 500 mg via ORAL
  Filled 2015-10-06: qty 1

## 2015-10-06 MED ORDER — HYDROCODONE-ACETAMINOPHEN 5-325 MG PO TABS
1.0000 | ORAL_TABLET | Freq: Once | ORAL | Status: AC
  Administered 2015-10-06: 1 via ORAL
  Filled 2015-10-06: qty 1

## 2015-10-06 MED ORDER — HYDROCODONE-ACETAMINOPHEN 5-325 MG PO TABS: ORAL_TABLET | ORAL | 0 refills | Status: DC

## 2015-10-06 NOTE — ED Notes (Signed)
Family at bedside. 

## 2015-10-06 NOTE — Progress Notes (Signed)
Subjective:     Patient ID: Rodney [name], male   DOB: 1940-06-29, 75 y.o.   MRN: [id number]  HPI patient presents with swelling of the right foot and ankle and a lot of pain in the back of the right leg with daughter. Also states these had a history of pulmonary embolism and his blood is not been running properly and has been running thick   Review of Systems     Objective:   Physical Exam Neurovascular status unchanged with pain in the calf muscle right with positive Homans sign noted and edema in the right foot and ankle    Assessment:     Appears to be strong possibility of a blood clot and at this point as precautionary measure I reviewed this fact with him and his daughter    Plan:     I reviewed that this could be a blood clot and I'm sending him straight to the hospital for evaluation given history of pulmonary embolism. They will go through the emergency room and are instructed to call me with the answer and if they have any trouble at the emergency room

## 2015-10-06 NOTE — ED Notes (Signed)
Pt given a diet coke. 

## 2015-10-06 NOTE — Progress Notes (Signed)
VASCULAR LAB PRELIMINARY  PRELIMINARY  PRELIMINARY  PRELIMINARY  Right lower extremity venous duplex completed.    Preliminary report:  There is no DVT or SVT noted in the right lower extremity.     Victoire Deans, RVT 10/06/2015, 7:32 PM

## 2015-10-06 NOTE — ED Notes (Signed)
Venous doppler being done at bedside

## 2015-10-06 NOTE — ED Triage Notes (Signed)
Patient presents for right ankle swelling x2 weeks. Sent by PCP to rule out DVT. History of PE. Takes Warfarin. Ankle with 1+ pitting edema, redness and warm to touch. Denies fever, SOB.

## 2015-10-06 NOTE — Discharge Instructions (Signed)
Please read and follow all provided instructions.  Your diagnoses today include:  1. Edema of right lower extremity    Tests performed today include:  Vital signs. See below for your results today.   Medications prescribed:   Keflex (cephalexin) - antibiotic  You have been prescribed an antibiotic medicine: take the entire course of medicine even if you are feeling better. Stopping early can cause the antibiotic not to work.  Take any prescribed medications only as directed.   Home care instructions:  Follow any educational materials contained in this packet. Keep affected area above the level of your heart when possible. Wash area gently twice a day with warm soapy water. Do not apply alcohol or hydrogen peroxide. Cover the area if it draining or weeping.   Follow-up instructions: Return to the Emergency Department or see your doctor in 48 hours for a recheck.  Return instructions:  Return to the Emergency Department if you have:  Fever  Worsening symptoms  Worsening pain  Worsening swelling  Redness of the skin that moves away from the affected area, especially if it streaks away from the affected area   Any other emergent concerns  Your vital signs today were: BP 114/70    Pulse 62    Temp 98 F (36.7 C) (Oral)    Resp 17    Ht 5\' 11"  (1.803 m)    Wt 93 kg    SpO2 97%    BMI 28.59 kg/m  If your blood pressure (BP) was elevated above 135/85 this visit, please have this repeated by your doctor within one month. --------------

## 2015-10-06 NOTE — ED Provider Notes (Signed)
[facility] DEPT Provider Note   CSN: [id number] Arrival date & time: 10/06/15  1422  First Provider Contact:  First MD Initiated Contact with Patient 10/06/15 1640      History   Chief Complaint Chief Complaint  Patient presents with  . Leg Swelling    HPI Rodney [name] is a 75 y.o. male.  Patient with h/o DVT/PE on chronic coumadin, multiple myeloma -- presents with complaint of worsening right lower extremity edema. He is on chronic Coumadin therapy. Patient had pain and swelling in his right ankle which was treated with a boot by PCP. He was told that he likely had tendinitis. X-ray was negative. Today swelling was worsening and spread towards his calf. Patient has aching pain. No chest pain or shortness of breath. No fever, nausea or vomiting. Patient presents for ultrasound to rule out DVT. The onset of this condition was acute. The course is worsening. Aggravating factors: none. Alleviating factors: none.        Past Medical History:  Diagnosis Date  . Anemia 02-16-11   01-05-11- post surgery-Transfusions x 4 units  . Bone pain 02/18/2013  . Complication of anesthesia 02-16-11   Pt. speaks of awakening during the surgery from back  surgery  . Degenerative disc disease 02-16-11   01-05-11 L3 fusion done  . Esophageal reflux 05/31/2008   Qualifier: Diagnosis of  By: Arnoldo Morale MD, Balinda Quails   . Hernia 02-16-11   left inguinal hernia at present  . Hypothyroidism 02/01/2014  . Multiple myeloma 02-16-11   suspected tumor  left sacral  . Pancreatitis   . Personal history of traumatic fracture 07/10/2012  . Prostate cancer ([place])   . Pulmonary embolism ([place])   . Second degree atrioventricular block 11/26/2012  . Shortness of breath 02-16-11   hx. Pulmonary emboli x2 (9 yrs/ 3'12 -last)    Patient Active Problem List   Diagnosis Date Noted  . Right foot pain 10/05/2015  . Tendonitis of ankle, left 10/03/2015  . Encounter for therapeutic drug monitoring 08/12/2014  .  Hypothyroidism 02/01/2014  . Allergic rhinitis 02/01/2014  . Bone pain 02/18/2013  . Multiple myeloma in remission ([place]) 07/10/2012  . Back pain, lumbosacral 02/11/2012  . ESOPHAGEAL REFLUX 05/31/2008  . ACUT DUOD ULCER W/O MENTION HEMORR/PERF W/OBST 01/05/2008  . PROSTATE CANCER, HX OF 01/05/2008  . Pulmonary embolism ([place]) 01/05/2008    Past Surgical History:  Procedure Laterality Date  . BACK SURGERY  02-16-11    01-05-11 Lumbar surgery L3(complicated by loss of blood volume)/ 01-09-11 then Lumbar fusion done with retained  hardware   . CHOLECYSTECTOMY  04/06/2010   laparoscopic-inflammation with stones  . HERNIA REPAIR  02/20/11   Inguinal hernia repair w/mesh  . INGUINAL HERNIA REPAIR  02/20/2011   Procedure: HERNIA REPAIR INGUINAL ADULT;  Surgeon: Earnstine Regal, MD;  Location: [facility];  Service: General;  Laterality: Left;  Repair Left Inguinal Hernia with Mesh  . PROSTATE SURGERY Bilateral 2011       Home Medications    Prior to Admission medications   Medication Sig Start Date End Date Taking? Authorizing Provider  Cholecalciferol (VITAMIN D) 1000 UNITS capsule Take 5,000 Units by mouth daily.   Yes Historical Provider, MD  KRILL OIL PO Take 1 tablet by mouth 2 (two) times daily.    Yes Historical Provider, MD  levothyroxine (SYNTHROID, LEVOTHROID) 100 MCG tablet TAKE ONE TABLET BY MOUTH ONCE DAILY. NEED TO MAKE APPOINTMENT 09/08/15  Yes Lucretia Kern, DO  methylcellulose (ARTIFICIAL TEARS) 1 % ophthalmic solution Place 2 drops into both eyes 4 (four) times daily as needed (dry eyes).    Yes Historical Provider, MD  omeprazole (PRILOSEC) 20 MG capsule Take 20 mg by mouth 2 (two) times daily before a meal. HEART BURN    Yes Historical Provider, MD  ondansetron (ZOFRAN) 4 MG tablet Take 1 tablet (4 mg total) by mouth every 8 (eight) hours as needed for nausea or vomiting. 06/03/15  Yes Lucretia Kern, DO  POTASSIUM PO Take 1 tablet by mouth daily.   Yes Historical Provider, MD    REVLIMID 10 MG capsule Take 10 mg by mouth daily. 21 days on and 7 days off 05/16/12  Yes Historical Provider, MD  warfarin (COUMADIN) 5 MG tablet TAKE BY MOUTH AS DIRECTED BY ANTICOAGULATION CLINIC Patient taking differently: TAKE 1 TABLET BY MOUTH DAILY 09/29/15  Yes Lucretia Kern, DO  calcium carbonate (TUMS - DOSED IN MG ELEMENTAL CALCIUM) 500 MG chewable tablet Chew 1 tablet by mouth every 2 (two) hours as needed. HEART BURN      Historical Provider, MD  cyclobenzaprine (FLEXERIL) 10 MG tablet Take 1 tablet (10 mg total) by mouth 3 (three) times daily as needed for muscle spasms. Patient not taking: Reported on 10/06/2015 03/16/14   Heath Lark, MD    Family History Family History  Problem Relation Age of Onset  . Lymphoma Father 48  . Cancer Father   . Clotting disorder Mother     blood clots  . Cancer Brother     throat ca    Social History Social History  Substance Use Topics  . Smoking status: Former Smoker    Quit date: 02/15/1969  . Smokeless tobacco: Never Used     Comment: quit 50 yrs  . Alcohol use No     Allergies   Blood-group specific substance   Review of Systems Review of Systems  Constitutional: Negative for activity change and fever.  HENT: Negative for rhinorrhea and sore throat.   Eyes: Negative for redness.  Respiratory: Negative for cough.   Cardiovascular: Positive for leg swelling. Negative for chest pain.  Gastrointestinal: Negative for abdominal pain, diarrhea, nausea and vomiting.  Genitourinary: Negative for dysuria.  Musculoskeletal: Positive for arthralgias. Negative for back pain, gait problem, joint swelling, myalgias and neck pain.  Skin: Positive for color change. Negative for rash and wound.  Neurological: Negative for weakness, numbness and headaches.    Physical Exam Updated Vital Signs BP 110/77 (BP Location: Right Arm)   Pulse 64   Temp 98 F (36.7 C) (Oral)   Resp 16   SpO2 97%   Physical Exam  Constitutional: He appears  well-developed and well-nourished.  HENT:  Head: Normocephalic and atraumatic.  Eyes: Conjunctivae are normal. Right eye exhibits no discharge. Left eye exhibits no discharge.  Neck: Normal range of motion. Neck supple.  Cardiovascular: Normal rate, regular rhythm, normal heart sounds and normal pulses.  Exam reveals no decreased pulses.   Pulmonary/Chest: Effort normal and breath sounds normal.  Abdominal: Soft. There is no tenderness.  Musculoskeletal: He exhibits tenderness. He exhibits no edema.  Swelling of R ankle and calf with medial abrasions (from boot per patient). 1+ pitting edema.   Neurological: He is alert. No sensory deficit.  Motor, sensation, and vascular distal to the injury is fully intact.   Skin: Skin is warm and dry.  Psychiatric: He has a normal mood and affect.  Nursing note and vitals reviewed.  ED Treatments / Results  Labs (all labs ordered are listed, but only abnormal results are displayed) Labs Reviewed  PROTIME-INR - Abnormal; Notable for the following:       Result Value   Prothrombin Time 28.8 (*)    All other components within normal limits    Radiology No results found.  Procedures Procedures (including critical care time)  Medications Ordered in ED Medications - No data to display   Initial Impression / Assessment and Plan / ED Course  I have reviewed the triage vital signs and the nursing notes.  Pertinent labs & imaging results that were available during my care of the patient were reviewed by me and considered in my medical decision making (see chart for details).  Clinical Course   4:49 PM Patient seen and examined. Ultrasound is ordered, pending.   Vital signs reviewed and are as follows: BP 110/77 (BP Location: Right Arm)   Pulse 64   Temp 98 F (36.7 C) (Oral)   Resp 16   SpO2 97%   Patient discussed with and seen by Dr. Tamera Punt.   US performed and does not show DVT. Will start on keflex to cover for cellulitis. Vicodin  for pain. Plan discussed with patient and daughters at bedside, they are in agreement. Encouraged close follow-up with PCP for recheck in the next 48 hours.  9:15 PM Pt urged to return with worsening pain, worsening swelling, expanding area of redness or streaking up extremity, fever, or any other concerns. Urged to take complete course of antibiotics as prescribed. Counseled to take pain medications as prescribed. Pt verbalizes understanding and agrees with plan.  Patient counseled on use of narcotic pain medications. Counseled not to combine these medications with others containing tylenol. Urged not to drink alcohol, drive, or perform any other activities that requires focus while taking these medications. The patient verbalizes understanding and agrees with the plan.    Final Clinical Impressions(s) / ED Diagnoses   Final diagnoses:  Edema of right lower extremity   Patient with swelling of the right ankle and calf, worsening today. He is already on Coumadin and INR is therapeutic today. Patient sent to the emergency department for lower extremity ultrasound. This is negative for DVT. Will treat for possible cellulitis as area is swollen, warm to touch, mildly erythematous. Patient is afebrile and does not have other systemic symptoms of illness. Inflammatory arthritis is also a possibility. Follow-up as above. Return instructions as above.   New Prescriptions Discharge Medication List as of 10/06/2015  7:51 PM       Carlisle Cater, PA-C 10/06/15 2117    Malvin Johns, MD 10/06/15 2348

## 2015-10-09 ENCOUNTER — Other Ambulatory Visit: Payer: Self-pay | Admitting: Family Medicine

## 2015-10-10 ENCOUNTER — Ambulatory Visit (INDEPENDENT_AMBULATORY_CARE_PROVIDER_SITE_OTHER): Payer: Medicare Other | Admitting: Podiatry

## 2015-10-10 ENCOUNTER — Ambulatory Visit: Payer: Medicare Other | Admitting: Family Medicine

## 2015-10-10 ENCOUNTER — Telehealth: Payer: Self-pay | Admitting: Family Medicine

## 2015-10-10 DIAGNOSIS — M779 Enthesopathy, unspecified: Secondary | ICD-10-CM | POA: Diagnosis not present

## 2015-10-10 DIAGNOSIS — R600 Localized edema: Secondary | ICD-10-CM | POA: Diagnosis not present

## 2015-10-10 NOTE — Telephone Encounter (Signed)
Pt want to transfer to [facility] will this be ok.

## 2015-10-10 NOTE — Progress Notes (Deleted)
HPI:  LENOX BINK is a 75 yo with a complicated PMH sig for MM and prostate ca, managed by his specialist, hypothyroidism, chronic pain, hx thromboembolism on coumadin an dGERD here for an acute visit for:  Cellulitis: -developed leg redness and swelling *** -s/p eval in ER with duplex US neg and started on keflex -reports: -denies:  Hypothyroidism: -on synthroid -feels is stable, rare comes for f/u visit (usually acute visits only) -noticed need tsh check  HX thromboembolism: -on chronic coumadin -last INR 2.65 10/06/15  GERD: -hx duodenal ulcer -on daily low dose PPI   ROS: See pertinent positives and negatives per HPI.  Past Medical History:  Diagnosis Date  . Anemia 02-16-11   01-05-11- post surgery-Transfusions x 4 units  . Bone pain 02/18/2013  . Complication of anesthesia 02-16-11   Pt. speaks of awakening during the surgery from back  surgery  . Degenerative disc disease 02-16-11   01-05-11 L3 fusion done  . Esophageal reflux 05/31/2008   Qualifier: Diagnosis of  By: Arnoldo Morale MD, Balinda Quails   . Hernia 02-16-11   left inguinal hernia at present  . Hypothyroidism 02/01/2014  . Multiple myeloma 02-16-11   suspected tumor  left sacral  . Pancreatitis   . Personal history of traumatic fracture 07/10/2012  . Prostate cancer ([place])   . Pulmonary embolism ([place])   . Second degree atrioventricular block 11/26/2012  . Shortness of breath 02-16-11   hx. Pulmonary emboli x2 (9 yrs/ 3'12 -last)    Past Surgical History:  Procedure Laterality Date  . BACK SURGERY  02-16-11    01-05-11 Lumbar surgery L3(complicated by loss of blood volume)/ 01-09-11 then Lumbar fusion done with retained  hardware   . CHOLECYSTECTOMY  04/06/2010   laparoscopic-inflammation with stones  . HERNIA REPAIR  02/20/11   Inguinal hernia repair w/mesh  . INGUINAL HERNIA REPAIR  02/20/2011   Procedure: HERNIA REPAIR INGUINAL ADULT;  Surgeon: Earnstine Regal, MD;  Location: [facility];  Service: General;   Laterality: Left;  Repair Left Inguinal Hernia with Mesh  . PROSTATE SURGERY Bilateral 2011    Family History  Problem Relation Age of Onset  . Lymphoma Father 63  . Cancer Father   . Clotting disorder Mother     blood clots  . Cancer Brother     throat ca    Social History   Social History  . Marital status: Married    Spouse name: N/A  . Number of children: N/A  . Years of education: N/A   Social History Main Topics  . Smoking status: Former Smoker    Quit date: 02/15/1969  . Smokeless tobacco: Never Used     Comment: quit 50 yrs  . Alcohol use No  . Drug use: No  . Sexual activity: Yes   Other Topics Concern  . Not on file   Social History Narrative   Married   Regular exercise-yes     Current Outpatient Prescriptions:  .  calcium carbonate (TUMS - DOSED IN MG ELEMENTAL CALCIUM) 500 MG chewable tablet, Chew 1 tablet by mouth every 2 (two) hours as needed. HEART BURN  , Disp: , Rfl:  .  cephALEXin (KEFLEX) 500 MG capsule, Take 1 capsule (500 mg total) by mouth 4 (four) times daily., Disp: 28 capsule, Rfl: 0 .  Cholecalciferol (VITAMIN D) 1000 UNITS capsule, Take 5,000 Units by mouth daily., Disp: , Rfl:  .  cyclobenzaprine (FLEXERIL) 10 MG tablet, Take 1 tablet (10 mg total)  by mouth 3 (three) times daily as needed for muscle spasms. (Patient not taking: Reported on 10/06/2015), Disp: 30 tablet, Rfl: 3 .  HYDROcodone-acetaminophen (NORCO/VICODIN) 5-325 MG tablet, Take 1-2 tablets every 6 hours as needed for severe pain, Disp: 8 tablet, Rfl: 0 .  KRILL OIL PO, Take 1 tablet by mouth 2 (two) times daily. , Disp: , Rfl:  .  levothyroxine (SYNTHROID, LEVOTHROID) 100 MCG tablet, TAKE ONE TABLET BY MOUTH ONCE DAILY, Disp: 30 tablet, Rfl: 0 .  methylcellulose (ARTIFICIAL TEARS) 1 % ophthalmic solution, Place 2 drops into both eyes 4 (four) times daily as needed (dry eyes). , Disp: , Rfl:  .  omeprazole (PRILOSEC) 20 MG capsule, Take 20 mg by mouth 2 (two) times daily before  a meal. HEART BURN , Disp: , Rfl:  .  ondansetron (ZOFRAN) 4 MG tablet, Take 1 tablet (4 mg total) by mouth every 8 (eight) hours as needed for nausea or vomiting., Disp: 20 tablet, Rfl: 0 .  POTASSIUM PO, Take 1 tablet by mouth daily., Disp: , Rfl:  .  REVLIMID 10 MG capsule, Take 10 mg by mouth daily. 21 days on and 7 days off, Disp: , Rfl:  .  warfarin (COUMADIN) 5 MG tablet, TAKE BY MOUTH AS DIRECTED BY ANTICOAGULATION CLINIC (Patient taking differently: TAKE 1 TABLET BY MOUTH DAILY), Disp: 30 tablet, Rfl: 3  EXAM:  There were no vitals filed for this visit.  There is no height or weight on file to calculate BMI.  GENERAL: vitals reviewed and listed above, alert, oriented, appears well hydrated and in no acute distress  HEENT: atraumatic, conjunttiva clear, no obvious abnormalities on inspection of external nose and ears  NECK: no obvious masses on inspection  LUNGS: clear to auscultation bilaterally, no wheezes, rales or rhonchi, good air movement  CV: HRRR, no peripheral edema  MS: moves all extremities without noticeable abnormality  PSYCH: pleasant and cooperative, no obvious depression or anxiety  ASSESSMENT AND PLAN:  Discussed the following assessment and plan:  No diagnosis found.  -Patient advised to return or notify a doctor immediately if symptoms worsen or persist or new concerns arise.  There are no Patient Instructions on file for this visit.  Colin Benton R., DO

## 2015-10-10 NOTE — Telephone Encounter (Signed)
Ok with me if ok with Cory. 

## 2015-10-11 NOTE — Telephone Encounter (Signed)
That is ok with me  

## 2015-10-11 NOTE — Progress Notes (Signed)
Subjective:     Patient ID: Rodney [name], male   DOB: 16-Feb-1941, 75 y.o.   MRN: [id number]  HPI patient presents as a walk-in stating that he had his veins checked and it does not appear that he has a clot and that it appears to be more of a localized process within his foot and ankle   Review of Systems     Objective:   Physical Exam Neurovascular status unchanged negative Homan sign was noted with patient found to have discomfort in the right foot and ankle with +1 and +2 pitting edema. Patient has no other systemic signs of problems and just has pain in his foot and I did not note currently any erythema I did not note any proximal changes and it is localized to the foot and ankle    Assessment:     Possibility for localized inflammatory process occurring versus other conditions    Plan:     Educated him if he should have any fundamental change in health he is to call us immediately and go to the emergency room and at this time I did go ahead and applied Unna boot Ace wrap and surgical shoe to try to reduce the swelling. I gave him strict instructions to take it off immediately if the causes him any trouble and if not I like them to leave it on for 4 days and then reappoint [place]

## 2015-10-12 NOTE — Telephone Encounter (Signed)
Pt will call back to sch his appt with [facility]

## 2015-10-13 ENCOUNTER — Telehealth: Payer: Self-pay | Admitting: *Deleted

## 2015-10-13 NOTE — Telephone Encounter (Signed)
Pt states he has had the wrap on since Monday and would like to take a shower.  I told pt that he needed to keep the wrap on for 4 days, so if he showered he would have to bag the wrapped foot and be careful of the slickness of the wet bag.

## 2015-10-17 ENCOUNTER — Ambulatory Visit (INDEPENDENT_AMBULATORY_CARE_PROVIDER_SITE_OTHER): Payer: Medicare Other | Admitting: Podiatry

## 2015-10-17 ENCOUNTER — Encounter: Payer: Self-pay | Admitting: Podiatry

## 2015-10-17 DIAGNOSIS — M779 Enthesopathy, unspecified: Secondary | ICD-10-CM

## 2015-10-17 DIAGNOSIS — R600 Localized edema: Secondary | ICD-10-CM

## 2015-10-17 MED ORDER — TRIAMCINOLONE ACETONIDE 10 MG/ML IJ SUSP
10.0000 mg | Freq: Once | INTRAMUSCULAR | Status: AC
  Administered 2015-10-17: 10 mg

## 2015-10-18 NOTE — Progress Notes (Signed)
Subjective:     Patient ID: Rodney [name], male   DOB: 1940/08/03, 75 y.o.   MRN: [id number]  HPI patient states that he is some improved but continues to have swelling and is having a lot of pain in his ankle and is walking better but still concerned about discomfort and swelling   Review of Systems     Objective:   Physical Exam Neurovascular status intact with negative Homans sign noted and negative pathology from having venous testing. He has quite a bit of discomfort in the sinus tarsi right with continued edema forefoot midfoot and ankle that has improved with compression    Assessment:     Inflammatory capsulitis right sinus tarsi with continued inflammation    Plan:     Injected the sinus tarsi right 3 mg Kenalog 5 mg Xylocaine and applied a ankle compression stocking to try to reduce swelling and reappoint 2 weeks or earlier if needed

## 2015-10-24 ENCOUNTER — Ambulatory Visit (INDEPENDENT_AMBULATORY_CARE_PROVIDER_SITE_OTHER): Payer: Medicare Other | Admitting: General Practice

## 2015-10-24 DIAGNOSIS — Z5181 Encounter for therapeutic drug level monitoring: Secondary | ICD-10-CM

## 2015-10-24 LAB — POCT INR: INR: 3.7

## 2015-10-26 NOTE — Telephone Encounter (Signed)
Patient never returned call  

## 2015-10-31 ENCOUNTER — Encounter: Payer: Self-pay | Admitting: Podiatry

## 2015-10-31 ENCOUNTER — Ambulatory Visit (INDEPENDENT_AMBULATORY_CARE_PROVIDER_SITE_OTHER): Payer: Medicare Other | Admitting: Podiatry

## 2015-10-31 DIAGNOSIS — M779 Enthesopathy, unspecified: Secondary | ICD-10-CM

## 2015-11-01 NOTE — Progress Notes (Signed)
Subjective:     Patient ID: Rodney [name], male   DOB: 03/24/1940, 74 y.o.   MRN: [id number]  HPI patient presents stating he's feeling better but still having pain if he is on it too much   Review of Systems     Objective:   Physical Exam  Neurovascular status intact muscle strength adequate range of motion within normal limits with patient found to have tendinitis which is improved by about 70% but still painful with ambulation and moderate fasciitis-like symptoms    Assessment:     Tendinitis still present with moderate improvement    Plan:     Advised on physical therapy anti-inflammatories and supportive shoe gear. Patient will be seen back to recheck

## 2015-11-05 ENCOUNTER — Other Ambulatory Visit: Payer: Self-pay | Admitting: Family Medicine

## 2015-11-14 DIAGNOSIS — H04123 Dry eye syndrome of bilateral lacrimal glands: Secondary | ICD-10-CM | POA: Diagnosis not present

## 2015-11-14 DIAGNOSIS — H5211 Myopia, right eye: Secondary | ICD-10-CM | POA: Diagnosis not present

## 2015-11-14 DIAGNOSIS — Z961 Presence of intraocular lens: Secondary | ICD-10-CM | POA: Diagnosis not present

## 2015-11-14 DIAGNOSIS — H43813 Vitreous degeneration, bilateral: Secondary | ICD-10-CM | POA: Diagnosis not present

## 2015-11-21 ENCOUNTER — Ambulatory Visit (INDEPENDENT_AMBULATORY_CARE_PROVIDER_SITE_OTHER): Payer: Medicare Other | Admitting: General Practice

## 2015-11-21 ENCOUNTER — Ambulatory Visit: Payer: Medicare Other

## 2015-11-21 DIAGNOSIS — Z5181 Encounter for therapeutic drug level monitoring: Secondary | ICD-10-CM | POA: Diagnosis not present

## 2015-11-21 LAB — POCT INR: INR: 2.9

## 2015-12-10 ENCOUNTER — Other Ambulatory Visit: Payer: Self-pay | Admitting: Family Medicine

## 2015-12-19 ENCOUNTER — Ambulatory Visit: Payer: Medicare Other

## 2016-01-02 ENCOUNTER — Ambulatory Visit: Payer: Medicare Other | Admitting: Hematology and Oncology

## 2016-01-02 ENCOUNTER — Other Ambulatory Visit: Payer: Medicare Other

## 2016-01-02 ENCOUNTER — Ambulatory Visit: Payer: Medicare Other

## 2016-01-02 ENCOUNTER — Other Ambulatory Visit: Payer: Self-pay | Admitting: Hematology and Oncology

## 2016-01-09 DIAGNOSIS — C9000 Multiple myeloma not having achieved remission: Secondary | ICD-10-CM | POA: Diagnosis not present

## 2016-01-09 DIAGNOSIS — R6889 Other general symptoms and signs: Secondary | ICD-10-CM | POA: Diagnosis not present

## 2016-01-12 ENCOUNTER — Ambulatory Visit (INDEPENDENT_AMBULATORY_CARE_PROVIDER_SITE_OTHER): Payer: Medicare Other | Admitting: General Practice

## 2016-01-12 ENCOUNTER — Telehealth: Payer: Self-pay | Admitting: *Deleted

## 2016-01-12 ENCOUNTER — Telehealth: Payer: Self-pay | Admitting: Hematology and Oncology

## 2016-01-12 DIAGNOSIS — Z5181 Encounter for therapeutic drug level monitoring: Secondary | ICD-10-CM | POA: Diagnosis not present

## 2016-01-12 LAB — POCT INR: INR: 2.8

## 2016-01-12 NOTE — Telephone Encounter (Signed)
Per staff message I have scheduled appt from 11/13 to 111/21

## 2016-01-12 NOTE — Patient Instructions (Signed)
Pre visit review using our clinic review tool, if applicable. No additional management support is needed unless otherwise documented below in the visit note. 

## 2016-01-12 NOTE — Progress Notes (Signed)
I have reviewed and agree with the plan. 

## 2016-01-12 NOTE — Telephone Encounter (Signed)
Patient called to have 11/13 appointments rescheduled per work schedule. The patient will have availability on 11/21

## 2016-01-13 ENCOUNTER — Other Ambulatory Visit: Payer: Self-pay | Admitting: Family Medicine

## 2016-01-16 ENCOUNTER — Ambulatory Visit: Payer: Medicare Other

## 2016-01-16 ENCOUNTER — Ambulatory Visit: Payer: Medicare Other | Admitting: Hematology and Oncology

## 2016-01-18 ENCOUNTER — Telehealth: Payer: Self-pay | Admitting: General Practice

## 2016-01-18 NOTE — Telephone Encounter (Signed)
Left msg regarding 01/24/16 appts.

## 2016-01-24 ENCOUNTER — Encounter: Payer: Self-pay | Admitting: Hematology and Oncology

## 2016-01-24 ENCOUNTER — Ambulatory Visit (HOSPITAL_BASED_OUTPATIENT_CLINIC_OR_DEPARTMENT_OTHER): Payer: Medicare Other | Admitting: Hematology and Oncology

## 2016-01-24 ENCOUNTER — Telehealth: Payer: Self-pay | Admitting: *Deleted

## 2016-01-24 ENCOUNTER — Ambulatory Visit (HOSPITAL_BASED_OUTPATIENT_CLINIC_OR_DEPARTMENT_OTHER): Payer: Medicare Other

## 2016-01-24 ENCOUNTER — Telehealth: Payer: Self-pay | Admitting: Hematology and Oncology

## 2016-01-24 DIAGNOSIS — Z9484 Stem cells transplant status: Secondary | ICD-10-CM | POA: Diagnosis not present

## 2016-01-24 DIAGNOSIS — G5603 Carpal tunnel syndrome, bilateral upper limbs: Secondary | ICD-10-CM

## 2016-01-24 DIAGNOSIS — C9001 Multiple myeloma in remission: Secondary | ICD-10-CM

## 2016-01-24 DIAGNOSIS — I2782 Chronic pulmonary embolism: Secondary | ICD-10-CM | POA: Diagnosis not present

## 2016-01-24 MED ORDER — SODIUM CHLORIDE 0.9 % IV SOLN: Freq: Once | INTRAVENOUS | Status: DC

## 2016-01-24 MED ORDER — ZOLEDRONIC ACID 4 MG/100ML IV SOLN
4.0000 mg | Freq: Once | INTRAVENOUS | Status: AC
  Administered 2016-01-24: 4 mg via INTRAVENOUS
  Filled 2016-01-24: qty 100

## 2016-01-24 NOTE — Assessment & Plan Note (Signed)
He has significant bilateral carpal tunnel syndrome likely related to his occupation. He is wearing a brace right now. The patient is not interested to retire yet. Continue conservative management for now

## 2016-01-24 NOTE — Patient Instructions (Signed)

## 2016-01-24 NOTE — Assessment & Plan Note (Signed)
According to the patient, he had labs drawn in [place] on 01/19/16 which showed complete remission The plan would be to continue Revlimid indefinitely and Zometa every 3 months. He has recent dental clearance with no concerns for osteonecrosis of the jaw. He will continue calcium with vitamin D supplements. He gets Revlimid through his oncologist in [place]

## 2016-01-24 NOTE — Assessment & Plan Note (Signed)
He will continue on Coumadin, managed by primary care doctor. The patient denies any recent signs or symptoms of bleeding such as spontaneous epistaxis, hematuria or hematochezia.

## 2016-01-24 NOTE — Progress Notes (Signed)
[facility] OFFICE PROGRESS NOTE  Patient Care Team: Terressa Koyanagi, DO as PCP - General (Family Medicine) Artis Delay, MD as Consulting Physician (Hematology and Oncology) Raye Sorrow, MD as Consulting Physician (Oncology)  SUMMARY OF ONCOLOGIC HISTORY:  The patient was initially diagnosed in 2012. He had chemotherapy followed by autologous stem cell transplant [facility] around October 2013. In April 2014 he was started on maintenance Revlimid 10 mg daily by mouth along with monthly Zometa. He receives his Revlimid through his physician from [place]. He is on warfarin therapy indefinitely, INR monitored by other physicians Starting on 05/18/2013, his Zometa is change to every 3 months. INTERVAL HISTORY: Please see below for problem oriented charting. He is seen recently at [facility] for his oncology care. He is doing very well without recent infection. The patient denies any recent signs or symptoms of bleeding such as spontaneous epistaxis, hematuria or hematochezia. His only main complaints are diffuse musculoskeletal joint pain and carpal tunnel syndrome bilaterally. He is taking over-the-counter analgesics  REVIEW OF SYSTEMS:   Constitutional: Denies fevers, chills or abnormal weight loss Eyes: Denies blurriness of vision Ears, nose, mouth, throat, and face: Denies mucositis or sore throat Respiratory: Denies cough, dyspnea or wheezes Cardiovascular: Denies palpitation, chest discomfort or lower extremity swelling Gastrointestinal:  Denies nausea, heartburn or change in bowel habits Skin: Denies abnormal skin rashes Lymphatics: Denies new lymphadenopathy or easy bruising Neurological:Denies numbness, tingling or new weaknesses Behavioral/Psych: Mood is stable, no new changes  All other systems were reviewed with the patient and are negative.  I have reviewed the past medical history, past surgical history, social history and family  history with the patient and they are unchanged from previous note.  ALLERGIES:  is allergic to blood-group specific substance.  MEDICATIONS:  Current Outpatient Prescriptions  Medication Sig Dispense Refill  . calcium carbonate (TUMS - DOSED IN MG ELEMENTAL CALCIUM) 500 MG chewable tablet Chew 1 tablet by mouth every 2 (two) hours as needed. HEART BURN      . Cholecalciferol (VITAMIN D) 1000 UNITS capsule Take 5,000 Units by mouth daily.    Marland Kitchen KRILL OIL PO Take 1 tablet by mouth 2 (two) times daily.     Marland Kitchen levothyroxine (SYNTHROID, LEVOTHROID) 100 MCG tablet TAKE ONE TABLET BY MOUTH ONCE DAILY 30 tablet 0  . methylcellulose (ARTIFICIAL TEARS) 1 % ophthalmic solution Place 2 drops into both eyes 4 (four) times daily as needed (dry eyes).     Marland Kitchen omeprazole (PRILOSEC) 20 MG capsule Take 20 mg by mouth 2 (two) times daily before a meal. HEART BURN     . POTASSIUM PO Take 1 tablet by mouth daily.    Marland Kitchen REVLIMID 10 MG capsule Take 10 mg by mouth daily. 21 days on and 7 days off    . warfarin (COUMADIN) 5 MG tablet TAKE BY MOUTH AS DIRECTED BY ANTICOAGULATION CLINIC (Patient taking differently: TAKE 1 TABLET BY MOUTH DAILY) 30 tablet 3  . cyclobenzaprine (FLEXERIL) 10 MG tablet Take 1 tablet (10 mg total) by mouth 3 (three) times daily as needed for muscle spasms. (Patient not taking: Reported on 01/24/2016) 30 tablet 3  . HYDROcodone-acetaminophen (NORCO/VICODIN) 5-325 MG tablet Take 1-2 tablets every 6 hours as needed for severe pain (Patient not taking: Reported on 01/24/2016) 8 tablet 0  . ondansetron (ZOFRAN) 4 MG tablet Take 1 tablet (4 mg total) by mouth every 8 (eight) hours as needed for nausea or vomiting. (Patient not taking:  Reported on 01/24/2016) 20 tablet 0   No current facility-administered medications for this visit.     PHYSICAL EXAMINATION: ECOG PERFORMANCE STATUS: 0 - Asymptomatic  Vitals:   01/24/16 0847  BP: 131/62  Pulse: (!) 55  Resp: 18  Temp: 98.1 F (36.7 C)    Filed Weights   01/24/16 0847  Weight: 207 lb 4.8 oz (94 kg)    GENERAL:alert, no distress and comfortable SKIN: skin color, texture, turgor are normal, no rashes or significant lesions EYES: normal, Conjunctiva are pink and non-injected, sclera clear OROPHARYNX:no exudate, no erythema and lips, buccal mucosa, and tongue normal  NECK: supple, thyroid normal size, non-tender, without nodularity LYMPH:  no palpable lymphadenopathy in the cervical, axillary or inguinal LUNGS: clear to auscultation and percussion with normal breathing effort HEART: regular rate & rhythm and no murmurs and no lower extremity edema ABDOMEN:abdomen soft, non-tender and normal bowel sounds Musculoskeletal:no cyanosis of digits and no clubbing  NEURO: alert & oriented x 3 with fluent speech, no focal motor/sensory deficits  LABORATORY DATA:  I have reviewed the data as listed    Component Value Date/Time   NA 140 11/18/2013 0756   K 4.0 11/18/2013 0756   CL 112 (H) 08/27/2012 1309   CO2 20 (L) 11/18/2013 0756   GLUCOSE 88 11/18/2013 0756   GLUCOSE 100 (H) 08/27/2012 1309   BUN 18.0 11/18/2013 0756   CREATININE 1.3 11/18/2013 0756   CALCIUM 8.7 11/18/2013 0756   PROT 7.0 11/18/2013 0756   ALBUMIN 3.4 (L) 11/18/2013 0756   AST 23 11/18/2013 0756   ALT 33 11/18/2013 0756   ALKPHOS 83 11/18/2013 0756   BILITOT 0.59 11/18/2013 0756   GFRNONAA 57 (L) 04/06/2012 1950   GFRAA 66 (L) 04/06/2012 1950    No results found for: SPEP, UPEP  Lab Results  Component Value Date   WBC 5.9 11/18/2013   NEUTROABS 3.4 11/18/2013   HGB 14.4 11/18/2013   HCT 43.9 11/18/2013   MCV 95.0 11/18/2013   PLT 181 11/18/2013      Chemistry      Component Value Date/Time   NA 140 11/18/2013 0756   K 4.0 11/18/2013 0756   CL 112 (H) 08/27/2012 1309   CO2 20 (L) 11/18/2013 0756   BUN 18.0 11/18/2013 0756   CREATININE 1.3 11/18/2013 0756      Component Value Date/Time   CALCIUM 8.7 11/18/2013 0756   ALKPHOS 83  11/18/2013 0756   AST 23 11/18/2013 0756   ALT 33 11/18/2013 0756   BILITOT 0.59 11/18/2013 0756      ASSESSMENT & PLAN:  Multiple myeloma in remission ([place])  According to the patient, he had labs drawn in [place] on 01/19/16 which showed complete remission The plan would be to continue Revlimid indefinitely and Zometa every 3 months. He has recent dental clearance with no concerns for osteonecrosis of the jaw. He will continue calcium with vitamin D supplements. He gets Revlimid through his oncologist in [place]  Pulmonary embolism He will continue on Coumadin, managed by primary care doctor. The patient denies any recent signs or symptoms of bleeding such as spontaneous epistaxis, hematuria or hematochezia.  Carpal tunnel syndrome, bilateral He has significant bilateral carpal tunnel syndrome likely related to his occupation. He is wearing a brace right now. The patient is not interested to retire yet. Continue conservative management for now   No orders of the defined types were placed in this encounter.  All questions were answered. The patient knows  to call the clinic with any problems, questions or concerns. No barriers to learning was detected. I spent 15 minutes counseling the patient face to face. The total time spent in the appointment was 20 minutes and more than 50% was on counseling and review of test results     Heath Lark, MD 01/24/2016 9:40 AM

## 2016-01-24 NOTE — Telephone Encounter (Signed)
Message sent to chemo scheduler to add Zometa, per 01/24/16 los.  Appointments scheduled per 01/24/16 los. A copy of the  AVS report and appointment schedule was given to patient,per 01/24/16 los.

## 2016-01-24 NOTE — Telephone Encounter (Signed)
Per LOS I have scheduled appts and notified the scheduler 

## 2016-01-27 ENCOUNTER — Other Ambulatory Visit: Payer: Self-pay | Admitting: Family Medicine

## 2016-01-30 ENCOUNTER — Other Ambulatory Visit: Payer: Self-pay | Admitting: General Practice

## 2016-01-30 MED ORDER — WARFARIN SODIUM 5 MG PO TABS: ORAL_TABLET | ORAL | 3 refills | Status: DC

## 2016-01-31 ENCOUNTER — Encounter: Payer: Self-pay | Admitting: Family Medicine

## 2016-01-31 ENCOUNTER — Ambulatory Visit (INDEPENDENT_AMBULATORY_CARE_PROVIDER_SITE_OTHER): Payer: Medicare Other | Admitting: Family Medicine

## 2016-01-31 VITALS — BP 120/70 | HR 60 | Temp 98.2°F | Ht 71.0 in | Wt 206.8 lb

## 2016-01-31 DIAGNOSIS — S39012A Strain of muscle, fascia and tendon of lower back, initial encounter: Secondary | ICD-10-CM

## 2016-01-31 MED ORDER — HYDROCODONE-ACETAMINOPHEN 5-325 MG PO TABS: ORAL_TABLET | ORAL | 0 refills | Status: DC

## 2016-01-31 NOTE — Progress Notes (Signed)
Pre visit review using our clinic review tool, if applicable. No additional management support is needed unless otherwise documented below in the visit note. 

## 2016-01-31 NOTE — Progress Notes (Signed)
Subjective:     Patient ID: Rodney [name], male   DOB: 1940/04/29, 75 y.o.   MRN: [id number]  HPI Acute visit for right lower lumbar back strain. Patient was at work around 12:35 PM today and helping to lift an object near the floor and squatting over and when lifting up felt a sharp pain in his right lower lumbar area. Took 2 Tylenol some relief.  He describes a dull pain which is moderate severity right lower lumbar area without radiculopathy symptoms. Denies any lower extremity numbness or weakness. Pain is worse with movement such as back flexion. No urine or stool incontinence.  He has history of pulmonary embolus on Coumadin. He has remote history of duodenal ulcer. He has multiple myeloma which is being treated both locally and apparently in [place]. His job is fairly physical and requires frequent lifting of 40-50 pounds.  Past Medical History:  Diagnosis Date  . Anemia 02-16-11   01-05-11- post surgery-Transfusions x 4 units  . Bone pain 02/18/2013  . Complication of anesthesia 02-16-11   Pt. speaks of awakening during the surgery from back  surgery  . Degenerative disc disease 02-16-11   01-05-11 L3 fusion done  . Esophageal reflux 05/31/2008   Qualifier: Diagnosis of  By: Arnoldo Morale MD, Balinda Quails   . Hernia 02-16-11   left inguinal hernia at present  . Hypothyroidism 02/01/2014  . Multiple myeloma 02-16-11   suspected tumor  left sacral  . Pancreatitis   . Personal history of traumatic fracture 07/10/2012  . Prostate cancer ([place])   . Pulmonary embolism ([place])   . Second degree atrioventricular block 11/26/2012  . Shortness of breath 02-16-11   hx. Pulmonary emboli x2 (9 yrs/ 3'12 -last)   Past Surgical History:  Procedure Laterality Date  . BACK SURGERY  02-16-11    01-05-11 Lumbar surgery L3(complicated by loss of blood volume)/ 01-09-11 then Lumbar fusion done with retained  hardware   . CHOLECYSTECTOMY  04/06/2010   laparoscopic-inflammation with stones  . HERNIA REPAIR   02/20/11   Inguinal hernia repair w/mesh  . INGUINAL HERNIA REPAIR  02/20/2011   Procedure: HERNIA REPAIR INGUINAL ADULT;  Surgeon: Earnstine Regal, MD;  Location: [facility] ORS;  Service: General;  Laterality: Left;  Repair Left Inguinal Hernia with Mesh  . PROSTATE SURGERY Bilateral 2011    reports that he quit smoking about 46 years ago. He has never used smokeless tobacco. He reports that he does not drink alcohol or use drugs. family history includes Cancer in his brother and father; Clotting disorder in his mother; Lymphoma (age of onset: 39) in his father. Allergies  Allergen Reactions  . Blood-Group Specific Substance     Must be Leukocyte Reduced and Irradiated due to stem cell transplant.        Review of Systems  Constitutional: Negative for activity change, appetite change and fever.  Respiratory: Negative for cough and shortness of breath.   Cardiovascular: Negative for chest pain and leg swelling.  Gastrointestinal: Negative for abdominal pain and vomiting.  Genitourinary: Negative for dysuria, flank pain and hematuria.  Musculoskeletal: Positive for back pain. Negative for joint swelling.  Neurological: Negative for weakness and numbness.       Objective:   Physical Exam  Constitutional: He is oriented to person, place, and time. He appears well-developed and well-nourished. No distress.  Neck: No thyromegaly present.  Cardiovascular: Normal rate, regular rhythm and normal heart sounds.   No murmur heard. Pulmonary/Chest: Effort normal and breath  sounds normal. No respiratory distress. He has no wheezes. He has no rales.  Musculoskeletal: He exhibits no edema.  Straight leg raises are negative bilaterally  Neurological: He is alert and oriented to person, place, and time. He has normal reflexes. No cranial nerve deficit.  Full-strength lower extremities. Trace reflex ankle and knee bilaterally  Skin: No rash noted.       Assessment:     Lumbar back strain following  lifting injury earlier today. He does have underlying history of multiple myeloma though current pain started acutely after lifting. Nonfocal exam    Plan:     -Try ice for symptomatic relief -Continue over-the-counter Tylenol as needed for pain and wrote for very limited Vicodin 5 mg 1-2 every 6 hours as needed for severe pain if not relieved with Tylenol. He knows not to take Tylenol concomitant with Vicodin.  Eulas Post MD [facility] [place]

## 2016-01-31 NOTE — Patient Instructions (Signed)
Lumbosacral Strain Lumbosacral strain is an injury that causes pain in the lower back (lumbosacral spine). This injury usually occurs from overstretching the muscles or ligaments along your spine. A strain can affect one or more muscles or cord-like tissues that connect bones to other bones (ligaments). What are the causes? This condition may be caused by:  A hard, direct hit (blow) to the back.  Excessive stretching of the lower back muscles. This may result from: ? A fall. ? Lifting something heavy. ? Repetitive movements such as bending or crouching.  What increases the risk? The following factors may increase your risk of getting this condition:  Participating in sports or activities that involve: ? A sudden twist of the back. ? Pushing or pulling motions.  Being overweight or obese.  Having poor strength and flexibility, especially tight hamstrings or weak muscles in the back or abdomen.  Having too much of a curve in the lower back.  Having a pelvis that is tilted forward.  What are the signs or symptoms? The main symptom of this condition is pain in the lower back, at the site of the strain. Pain may extend (radiate) down one or both legs. How is this diagnosed? This condition is diagnosed based on:  Your symptoms.  Your medical history.  A physical exam. ? Your health care provider may push on certain areas of your back to determine the source of your pain. ? You may be asked to bend forward, backward, and side to side to assess the severity of your pain and your range of motion.  Imaging tests, such as: ? X-rays. ? MRI.  How is this treated? Treatment for this condition may include:  Putting heat and cold on the affected area.  Medicines to help relieve pain and relax your muscles (muscle relaxants).  NSAIDs to help reduce swelling and discomfort.  When your symptoms improve, it is important to gradually return to your normal routine as soon as possible  to reduce pain, avoid stiffness, and avoid loss of muscle strength. Generally, symptoms should improve within 6 weeks of treatment. However, recovery time varies. Follow these instructions at home: Managing pain, stiffness, and swelling   If directed, put ice on the injured area during the first 24 hours after your strain. ? Put ice in a plastic bag. ? Place a towel between your skin and the bag. ? Leave the ice on for 20 minutes, 2-3 times a day.  If directed, put heat on the affected area as often as told by your health care provider. Use the heat source that your health care provider recommends, such as a moist heat pack or a heating pad. ? Place a towel between your skin and the heat source. ? Leave the heat on for 20-30 minutes. ? Remove the heat if your skin turns bright red. This is especially important if you are unable to feel pain, heat, or cold. You may have a greater risk of getting burned. Activity  Rest and return to your normal activities as told by your health care provider. Ask your health care provider what activities are safe for you.  Avoid activities that take a lot of energy for as long as told by your health care provider. General instructions  Take over-the-counter and prescription medicines only as told by your health care provider.  Donot drive or use heavy machinery while taking prescription pain medicine.  Do not use any products that contain nicotine or tobacco, such as cigarettes and e-cigarettes.   If you need help quitting, ask your health care provider.  Keep all follow-up visits as told by your health care provider. This is important. How is this prevented?  Use correct form when playing sports and lifting heavy objects.  Use good posture when sitting and standing.  Maintain a healthy weight.  Sleep on a mattress with medium firmness to support your back.  Be safe and responsible while being active to avoid falls.  Do at least 150 minutes of  moderate-intensity exercise each week, such as brisk walking or water aerobics. Try a form of exercise that takes stress off your back, such as swimming or stationary cycling.  Maintain physical fitness, including: ? Strength. ? Flexibility. ? Cardiovascular fitness. ? Endurance. Contact a health care provider if:  Your back pain does not improve after 6 weeks of treatment.  Your symptoms get worse. Get help right away if:  Your back pain is severe.  You cannot stand or walk.  You have difficulty controlling when you urinate or when you have a bowel movement.  You feel nauseous or you vomit.  Your feet get very cold.  You have numbness, tingling, weakness, or problems using your arms or legs.  You develop any of the following: ? Shortness of breath. ? Dizziness. ? Pain in your legs. ? Weakness in your buttocks or legs. ? Discoloration of the skin on your toes or legs. This information is not intended to replace advice given to you by your health care provider. Make sure you discuss any questions you have with your health care provider. Document Released: 11/29/2004 Document Revised: 09/09/2015 Document Reviewed: 07/24/2015 Elsevier Interactive Patient Education  2017 [employer]  

## 2016-02-06 ENCOUNTER — Ambulatory Visit: Payer: Medicare Other

## 2016-02-08 ENCOUNTER — Telehealth: Payer: Self-pay | Admitting: Emergency Medicine

## 2016-02-08 ENCOUNTER — Telehealth: Payer: Self-pay | Admitting: Adult Health

## 2016-02-08 ENCOUNTER — Ambulatory Visit: Payer: Medicare Other | Admitting: General Practice

## 2016-02-08 ENCOUNTER — Other Ambulatory Visit: Payer: Self-pay | Admitting: Family Medicine

## 2016-02-08 DIAGNOSIS — Z5181 Encounter for therapeutic drug level monitoring: Secondary | ICD-10-CM

## 2016-02-08 LAB — POCT INR: INR: 2.8

## 2016-02-08 NOTE — Telephone Encounter (Signed)
° °  Pt request refill of the following:  Pt call to ask if the following med can be refill. He said he picked up this med last month . He said it relaxes him     GABAPENTIN   Phamacy:  [facility]

## 2016-02-08 NOTE — Telephone Encounter (Signed)
Ok to refill this medication? I do not see on patient's current medication list.

## 2016-02-08 NOTE — Telephone Encounter (Signed)
That is fine with me. I have yet to see this patient

## 2016-02-08 NOTE — Telephone Encounter (Signed)
[place] with me, but only if pt is ok with my Not prescribing any schedule II or higher narcotic for chronic pain, as I do not do this

## 2016-02-08 NOTE — Telephone Encounter (Signed)
Patient notified and verbalized understanding. 

## 2016-02-08 NOTE — Telephone Encounter (Signed)
I am sorry, but I have not seen him yet. I do not feel comfortable prescribing medications for someone who I have not seen

## 2016-02-08 NOTE — Telephone Encounter (Signed)
Pt came into today and wants to know if he can transfer from [facility] to Dr Jenny Reichmann. Is this ok with the both of you? Please advise thanks.

## 2016-02-09 NOTE — Telephone Encounter (Signed)
Ok to refill 

## 2016-02-13 ENCOUNTER — Telehealth: Payer: Self-pay | Admitting: *Deleted

## 2016-02-13 NOTE — Telephone Encounter (Signed)
[facility] faxed a note stating the pt needs refills on Levothyroxine 119mcg and he has been on Sandoz brand which is on back order until 03/2016.  May the pharmacy use Sandoz brand?

## 2016-02-13 NOTE — Telephone Encounter (Signed)
I have not seen this patient and he is transferring to Dr. Jenny Reichmann on 12/15. I will let him refill the medications

## 2016-02-13 NOTE — Telephone Encounter (Signed)
He is transferring to Dr. Jenny Reichmann. I have not seen him yet. Do not refill at this time.

## 2016-02-14 NOTE — Telephone Encounter (Signed)
I decline to give medical advice for a patient who has not established

## 2016-02-17 ENCOUNTER — Ambulatory Visit (INDEPENDENT_AMBULATORY_CARE_PROVIDER_SITE_OTHER): Payer: Medicare Other | Admitting: Internal Medicine

## 2016-02-17 ENCOUNTER — Telehealth: Payer: Self-pay

## 2016-02-17 ENCOUNTER — Encounter: Payer: Self-pay | Admitting: Internal Medicine

## 2016-02-17 ENCOUNTER — Other Ambulatory Visit (INDEPENDENT_AMBULATORY_CARE_PROVIDER_SITE_OTHER): Payer: Medicare Other

## 2016-02-17 VITALS — BP 140/80 | HR 74 | Temp 98.2°F | Resp 20 | Wt 202.0 lb

## 2016-02-17 DIAGNOSIS — M549 Dorsalgia, unspecified: Secondary | ICD-10-CM | POA: Diagnosis not present

## 2016-02-17 DIAGNOSIS — E039 Hypothyroidism, unspecified: Secondary | ICD-10-CM | POA: Diagnosis not present

## 2016-02-17 DIAGNOSIS — G5603 Carpal tunnel syndrome, bilateral upper limbs: Secondary | ICD-10-CM | POA: Diagnosis not present

## 2016-02-17 DIAGNOSIS — Z0001 Encounter for general adult medical examination with abnormal findings: Secondary | ICD-10-CM

## 2016-02-17 DIAGNOSIS — G8929 Other chronic pain: Secondary | ICD-10-CM | POA: Insufficient documentation

## 2016-02-17 LAB — CBC WITH DIFFERENTIAL/PLATELET
BASOS PCT: 2.7 % (ref 0.0–3.0)
Basophils Absolute: 0.1 10*3/uL (ref 0.0–0.1)
EOS PCT: 4.5 % (ref 0.0–5.0)
Eosinophils Absolute: 0.2 10*3/uL (ref 0.0–0.7)
HCT: 41.8 % (ref 39.0–52.0)
HEMOGLOBIN: 14.2 g/dL (ref 13.0–17.0)
LYMPHS ABS: 0.9 10*3/uL (ref 0.7–4.0)
Lymphocytes Relative: 17.3 % (ref 12.0–46.0)
MCHC: 34 g/dL (ref 30.0–36.0)
MCV: 95.7 fl (ref 78.0–100.0)
MONO ABS: 0.9 10*3/uL (ref 0.1–1.0)
MONOS PCT: 17.8 % — AB (ref 3.0–12.0)
Neutro Abs: 3 10*3/uL (ref 1.4–7.7)
Neutrophils Relative %: 57.7 % (ref 43.0–77.0)
Platelets: 178 10*3/uL (ref 150.0–400.0)
RBC: 4.38 Mil/uL (ref 4.22–5.81)
RDW: 15.4 % (ref 11.5–15.5)
WBC: 5.3 10*3/uL (ref 4.0–10.5)

## 2016-02-17 LAB — BASIC METABOLIC PANEL
BUN: 20 mg/dL (ref 6–23)
CO2: 25 mEq/L (ref 19–32)
Calcium: 8.6 mg/dL (ref 8.4–10.5)
Chloride: 107 mEq/L (ref 96–112)
Creatinine, Ser: 1.24 mg/dL (ref 0.40–1.50)
GFR: 60.67 mL/min (ref >60.00)
Glucose, Bld: 106 mg/dL — ABNORMAL HIGH (ref 70–99)
POTASSIUM: 4.2 meq/L (ref 3.5–5.1)
SODIUM: 142 meq/L (ref 135–145)

## 2016-02-17 LAB — URINALYSIS, ROUTINE W REFLEX MICROSCOPIC
Bilirubin Urine: NEGATIVE
Leukocytes, UA: NEGATIVE
NITRITE: NEGATIVE
Specific Gravity, Urine: >=1.03 — AB (ref 1.000–1.030)
TOTAL PROTEIN, URINE-UPE24: 30 — AB
Urine Glucose: NEGATIVE
Urobilinogen, UA: 0.2 (ref 0.0–1.0)
pH: 5.5 (ref 5.0–8.0)

## 2016-02-17 LAB — PSA

## 2016-02-17 LAB — LIPID PANEL
CHOL/HDL RATIO: 3
Cholesterol: 109 mg/dL (ref 0–200)
HDL: 34.3 mg/dL — ABNORMAL LOW (ref >39.00)
LDL CALC: 44 mg/dL (ref 0–99)
NonHDL: 74.23
Triglycerides: 153 mg/dL — ABNORMAL HIGH (ref 0.0–149.0)
VLDL: 30.6 mg/dL (ref 0.0–40.0)

## 2016-02-17 LAB — HEPATIC FUNCTION PANEL
ALK PHOS: 110 U/L (ref 39–117)
ALT: 31 U/L (ref 0–53)
AST: 24 U/L (ref 0–37)
Albumin: 4 g/dL (ref 3.5–5.2)
BILIRUBIN TOTAL: 0.4 mg/dL (ref 0.2–1.2)
Bilirubin, Direct: 0.1 mg/dL (ref 0.0–0.3)
Total Protein: 6.7 g/dL (ref 6.0–8.3)

## 2016-02-17 LAB — TSH: TSH: 4.58 u[IU]/mL — AB (ref 0.35–4.50)

## 2016-02-17 NOTE — Progress Notes (Signed)
Subjective:    Patient ID: Rodney [name], male    DOB: 1940-12-01, 75 y.o.   MRN: [id number]  HPI    Here for wellness and f/u;  Overall doing ok;  Pt denies Chest pain, worsening SOB, DOE, wheezing, orthopnea, PND, worsening LE edema, palpitations, dizziness or syncope.  Pt denies neurological change such as new headache, facial or extremity weakness.  Pt denies polydipsia, polyuria, or low sugar symptoms. Pt states overall good compliance with treatment and medications, good tolerability, and has been trying to follow appropriate diet.  Pt denies worsening depressive symptoms, suicidal ideation or panic. No fever, night sweats, wt loss, loss of appetite, or other constitutional symptoms.  Pt states good ability with ADL's, has low fall risk, home safety reviewed and adequate, no other significant changes in hearing or vision, and only occasionally active with exercise. Already had flu shot, declines tetanus and colonoscopy.  Sees oncology regarding MM and urology once yearly.    Pt continues to have recurring LBP without change in severity, bowel or bladder change, fever, wt loss,  worsening LE pain/numbness  But has 2 mo worsening right foot drop, with some gait change but no falls, also has chronic right lumbar and right buttock pain.  Does not want narcotic b/c has not helped well in the psat..  Would consider cymbalta but will talk to wife.  Pain about 8/10  Also with bilat hand pain and numbness, known bilat CTS, has been putting off for years but now willing for surgury referral,  Denies hyper or hypo thyroid symptoms such as voice, skin or hair change. Past Medical History:  Diagnosis Date  . Anemia 02-16-11   01-05-11- post surgery-Transfusions x 4 units  . Bone pain 02/18/2013  . Complication of anesthesia 02-16-11   Pt. speaks of awakening during the surgery from back  surgery  . Degenerative disc disease 02-16-11   01-05-11 L3 fusion done  . Esophageal reflux 05/31/2008   Qualifier:  Diagnosis of  By: Arnoldo Morale MD, Balinda Quails   . Hernia 02-16-11   left inguinal hernia at present  . Hypothyroidism 02/01/2014  . Multiple myeloma 02-16-11   suspected tumor  left sacral  . Pancreatitis   . Personal history of traumatic fracture 07/10/2012  . Prostate cancer ([place])   . Pulmonary embolism ([place])   . Second degree atrioventricular block 11/26/2012  . Shortness of breath 02-16-11   hx. Pulmonary emboli x2 (9 yrs/ 3'12 -last)   Past Surgical History:  Procedure Laterality Date  . BACK SURGERY  02-16-11    01-05-11 Lumbar surgery L3(complicated by loss of blood volume)/ 01-09-11 then Lumbar fusion done with retained  hardware   . CHOLECYSTECTOMY  04/06/2010   laparoscopic-inflammation with stones  . HERNIA REPAIR  02/20/11   Inguinal hernia repair w/mesh  . INGUINAL HERNIA REPAIR  02/20/2011   Procedure: HERNIA REPAIR INGUINAL ADULT;  Surgeon: Earnstine Regal, MD;  Location: [facility];  Service: General;  Laterality: Left;  Repair Left Inguinal Hernia with Mesh  . PROSTATE SURGERY Bilateral 2011    reports that he quit smoking about 47 years ago. He has never used smokeless tobacco. He reports that he does not drink alcohol or use drugs. family history includes Cancer in his brother and father; Clotting disorder in his mother; Lymphoma (age of onset: 45) in his father. Allergies  Allergen Reactions  . Blood-Group Specific Substance     Must be Leukocyte Reduced and Irradiated due to stem cell transplant.  Current Outpatient Prescriptions on File Prior to Visit  Medication Sig Dispense Refill  . calcium carbonate (TUMS - DOSED IN MG ELEMENTAL CALCIUM) 500 MG chewable tablet Chew 1 tablet by mouth every 2 (two) hours as needed. HEART BURN      . Cholecalciferol (VITAMIN D) 1000 UNITS capsule Take 5,000 Units by mouth daily.    . cyclobenzaprine (FLEXERIL) 10 MG tablet Take 1 tablet (10 mg total) by mouth 3 (three) times daily as needed for muscle spasms. 30 tablet 3  . levothyroxine  (SYNTHROID, LEVOTHROID) 100 MCG tablet TAKE ONE TABLET BY MOUTH ONCE DAILY 30 tablet 0  . methylcellulose (ARTIFICIAL TEARS) 1 % ophthalmic solution Place 2 drops into both eyes 4 (four) times daily as needed (dry eyes).     Marland Kitchen omeprazole (PRILOSEC) 20 MG capsule Take 20 mg by mouth 2 (two) times daily before a meal. HEART BURN     . ondansetron (ZOFRAN) 4 MG tablet Take 1 tablet (4 mg total) by mouth every 8 (eight) hours as needed for nausea or vomiting. 20 tablet 0  . POTASSIUM PO Take 1 tablet by mouth daily.    Marland Kitchen REVLIMID 10 MG capsule Take 10 mg by mouth daily. 21 days on and 7 days off    . warfarin (COUMADIN) 5 MG tablet TAKE BY MOUTH AS DIRECTED BY ANTICOAGULATION CLINIC 30 tablet 3   No current facility-administered medications on file prior to visit.     Review of Systems Constitutional: Negative for increased diaphoresis, or other activity, appetite or siginficant weight change other than noted HENT: Negative for worsening hearing loss, ear pain, facial swelling, mouth sores and neck stiffness.   Eyes: Negative for other worsening pain, redness or visual disturbance.  Respiratory: Negative for choking or stridor Cardiovascular: Negative for other chest pain and palpitations.  Gastrointestinal: Negative for worsening diarrhea, blood in stool, or abdominal distention Genitourinary: Negative for hematuria, flank pain or change in urine volume.  Musculoskeletal: Negative for myalgias or other joint complaints.  Skin: Negative for other color change and wound or drainage.  Neurological: Negative for syncope and numbness. other than noted Hematological: Negative for adenopathy. or other swelling Psychiatric/Behavioral: Negative for hallucinations, SI, self-injury, decreased concentration or other worsening agitation.  All other system neg per pt    Objective:   Physical Exam BP 140/80   Pulse 74   Temp 98.2 F (36.8 C) (Oral)   Resp 20   Wt 202 lb (91.6 kg)   SpO2 91%   BMI  28.17 kg/m  VS noted,  Constitutional: Pt is oriented to person, place, and time. Appears well-developed and well-nourished, in no significant distress Head: Normocephalic and atraumatic  Eyes: Conjunctivae and EOM are normal. Pupils are equal, round, and reactive to light Right Ear: External ear normal.  Left Ear: External ear normal Nose: Nose normal.  Mouth/Throat: Oropharynx is clear and moist  Neck: Normal range of motion. Neck supple. No JVD present. No tracheal deviation present or significant neck LA or mass Cardiovascular: Normal rate, regular rhythm, normal heart sounds and intact distal pulses.   Pulmonary/Chest: Effort normal and breath sounds without rales or wheezing  Abdominal: Soft. Bowel sounds are normal. NT. No HSM  Musculoskeletal: Normal range of motion. Exhibits no edema Lymphadenopathy: Has no cervical adenopathy.  Neurological: Pt is alert and oriented to person, place, and time. Pt has normal reflexes. No cranial nerve deficit. Motor grossly intact except for new recent right foot drop on ambulation Skin: Skin is warm  and dry. No rash noted or new ulcers Psychiatric:  Behavior is normal.  Depressed affect, marked irritable    Assessment & Plan:

## 2016-02-17 NOTE — Telephone Encounter (Signed)
Please advise patient wants to know what he needs to do about his thyroid medication.

## 2016-02-17 NOTE — Patient Instructions (Addendum)
Please call if you change your mind about starting the generic Cymbalta for pain  Please call if you change your mind about the [facility] orthopedic referral  Please continue all other medications as before, and refills have been done if requested.  Please have the pharmacy call with any other refills you may need.  Please continue your efforts at being more active, low cholesterol diet, and weight control.  You are otherwise up to date with prevention measures today.  Please keep your appointments with your specialists as you may have planned  Please go to the LAB in the Basement (turn left off the elevator) for the tests to be done today  You will be contacted by phone if any changes need to be made immediately.  Otherwise, you will receive a letter about your results with an explanation, but please check with MyChart first.  Please remember to sign up for MyChart if you have not done so, as this will be important to you in the future with finding out test results, communicating by private email, and scheduling acute appointments online when needed.  Please return in 6 months, or sooner if needed

## 2016-02-17 NOTE — Progress Notes (Signed)
Pre visit review using our clinic review tool, if applicable. No additional management support is needed unless otherwise documented below in the visit note. 

## 2016-02-17 NOTE — Telephone Encounter (Signed)
Patient has called back.  Gave MD response.  Patient states he does need refill sent to [facility] on [address].

## 2016-02-17 NOTE — Telephone Encounter (Signed)
A blood test has been done  Pt should cont same dose unless o/w notified, and have pharmacy call with any refill needs

## 2016-02-18 NOTE — Assessment & Plan Note (Signed)
Asympt, for med refill, cont same dose, check TSH Lab Results  Component Value Date   TSH 4.58 (H) 02/17/2016

## 2016-02-18 NOTE — Assessment & Plan Note (Addendum)
Ok for [employer] referral, otc wrist splints at night  In addition to the time spent performing CPE, I spent an additional 25 minutes face to face,in which greater than 50% of this time was spent in counseling and coordination of care for patient's acute illness as documented.

## 2016-02-18 NOTE — Assessment & Plan Note (Signed)
,  Overall doing well, age appropriate education and counseling updated, referrals for preventative services and immunizations addressed, dietary and smoking counseling addressed, most recent labs reviewed.  I have personally reviewed and have noted:  1) the patient's medical and social history 2) The pt's use of alcohol, tobacco, and illicit drugs 3) The patient's current medications and supplements 4) Functional ability including ADL's, fall risk, home safety risk, hearing and visual impairment 5) Diet and physical activities 6) Evidence for depression or mood disorder 7) The patient's height, weight, and BMI have been recorded in the chart  I have made referrals, and provided counseling and education based on review of the above  

## 2016-02-18 NOTE — Assessment & Plan Note (Signed)
Primarily left sided high lumbar paravertebral as well as right buttock related, 8/10 per pt but declines pain med, d/w pt to start cymbalta but will consider for now

## 2016-02-20 MED ORDER — LEVOTHYROXINE SODIUM 100 MCG PO TABS: 100.0000 ug | ORAL_TABLET | Freq: Every day | ORAL | 3 refills | Status: DC

## 2016-02-20 NOTE — Telephone Encounter (Signed)
REFILL SENT TO [facility].../[facility] 

## 2016-02-21 ENCOUNTER — Telehealth: Payer: Self-pay | Admitting: Internal Medicine

## 2016-02-21 NOTE — Telephone Encounter (Signed)
Please follow up in regard to refill on thyroid medication.

## 2016-02-21 NOTE — Telephone Encounter (Signed)
Pt called again about this refill

## 2016-02-21 NOTE — Telephone Encounter (Signed)
Left message for patient advising that medication was sent in 12/18 to [place]

## 2016-03-08 ENCOUNTER — Ambulatory Visit (INDEPENDENT_AMBULATORY_CARE_PROVIDER_SITE_OTHER): Payer: Medicare Other

## 2016-03-08 ENCOUNTER — Ambulatory Visit (INDEPENDENT_AMBULATORY_CARE_PROVIDER_SITE_OTHER): Payer: Medicare Other | Admitting: Podiatry

## 2016-03-08 ENCOUNTER — Encounter: Payer: Self-pay | Admitting: Podiatry

## 2016-03-08 VITALS — BP 123/68 | HR 66 | Resp 16

## 2016-03-08 DIAGNOSIS — L600 Ingrowing nail: Secondary | ICD-10-CM

## 2016-03-08 DIAGNOSIS — B351 Tinea unguium: Secondary | ICD-10-CM | POA: Diagnosis not present

## 2016-03-08 DIAGNOSIS — M79675 Pain in left toe(s): Secondary | ICD-10-CM

## 2016-03-08 DIAGNOSIS — M79674 Pain in right toe(s): Secondary | ICD-10-CM

## 2016-03-08 DIAGNOSIS — M79604 Pain in right leg: Secondary | ICD-10-CM

## 2016-03-08 DIAGNOSIS — M79605 Pain in left leg: Secondary | ICD-10-CM | POA: Diagnosis not present

## 2016-03-08 NOTE — Patient Instructions (Signed)

## 2016-03-09 ENCOUNTER — Ambulatory Visit (INDEPENDENT_AMBULATORY_CARE_PROVIDER_SITE_OTHER): Payer: Medicare Other | Admitting: General Practice

## 2016-03-09 ENCOUNTER — Telehealth: Payer: Self-pay | Admitting: Internal Medicine

## 2016-03-09 DIAGNOSIS — M545 Low back pain, unspecified: Secondary | ICD-10-CM

## 2016-03-09 DIAGNOSIS — C9000 Multiple myeloma not having achieved remission: Secondary | ICD-10-CM

## 2016-03-09 DIAGNOSIS — Z5181 Encounter for therapeutic drug level monitoring: Secondary | ICD-10-CM | POA: Diagnosis not present

## 2016-03-09 LAB — POCT INR: INR: 2.6

## 2016-03-09 MED ORDER — CYCLOBENZAPRINE HCL 10 MG PO TABS: 10.0000 mg | ORAL_TABLET | Freq: Three times a day (TID) | ORAL | 3 refills | Status: DC | PRN

## 2016-03-09 MED ORDER — DULOXETINE HCL 60 MG PO CPEP: 60.0000 mg | ORAL_CAPSULE | Freq: Every day | ORAL | 3 refills | Status: DC

## 2016-03-09 NOTE — Progress Notes (Signed)
I have reviewed and agree with the plan. 

## 2016-03-09 NOTE — Telephone Encounter (Signed)
Patient states he was in not long ago to see Dr. Jenny Reichmann.  States Dr. Jenny Reichmann was wanting him to take a certain medication but patient could not remember the name of the medication.  States that this was to help with back and leg pain.  Patient states he also needs refill on muscle relaxer.

## 2016-03-09 NOTE — Telephone Encounter (Signed)
Ok for flexeril refill - done erx  OK to cymbalta 60 qd - done erx

## 2016-03-09 NOTE — Progress Notes (Signed)
Subjective:     Patient ID: FENNER PROBUS, male   DOB: September 27, 1940, 76 y.o.   MRN: [id number]  HPI patient presents stating that he needs his nails cut as he cannot take care of it and the big toenail on his right foot is increasingly painful and he wants it removed permanently. Also complains of low-grade numbness but nothing of significance   Review of Systems     Objective:   Physical Exam Neurovascular status intact muscle strength adequate with patient found to have a incurvated painful right hallux nail that is ingrown in the corners and painful across the top. Also noted to have thick yellow brittle nails of all nails 1-5 both feet    Assessment:     Damaged hallux nail right with pain and mycotic nail infection with pain 1-5 both feet    Plan:       reviewed both conditions and recommended hallux nail removal. Today I went ahead and I explained risk and I then infiltrated the right hallux 60 mg like Marcaine mixture remove the hallux nail under sterile conditions and applied phenol 5 applications followed by alcohol lavaged and sterile dressing. Gave instructions on soaks and debrided nailbeds 1-5 of both feet

## 2016-03-09 NOTE — Patient Instructions (Signed)
Pre visit review using our clinic review tool, if applicable. No additional management support is needed unless otherwise documented below in the visit note. 

## 2016-03-09 NOTE — Telephone Encounter (Signed)
Patient is aware 

## 2016-03-15 ENCOUNTER — Telehealth: Payer: Self-pay | Admitting: Hematology and Oncology

## 2016-03-15 ENCOUNTER — Encounter: Payer: Self-pay | Admitting: Podiatry

## 2016-03-15 ENCOUNTER — Ambulatory Visit (INDEPENDENT_AMBULATORY_CARE_PROVIDER_SITE_OTHER): Payer: Self-pay | Admitting: Podiatry

## 2016-03-15 ENCOUNTER — Telehealth: Payer: Self-pay | Admitting: *Deleted

## 2016-03-15 VITALS — BP 152/85 | HR 57 | Resp 16

## 2016-03-15 DIAGNOSIS — B351 Tinea unguium: Secondary | ICD-10-CM

## 2016-03-15 DIAGNOSIS — M79674 Pain in right toe(s): Secondary | ICD-10-CM

## 2016-03-15 DIAGNOSIS — M79675 Pain in left toe(s): Secondary | ICD-10-CM

## 2016-03-15 NOTE — Telephone Encounter (Signed)
Per patient request I have moved appts from morning to the afternoon on 2/22. Patient aware of new time

## 2016-03-15 NOTE — Telephone Encounter (Signed)
Pt called to r/s ov/inf appt to 2/19 due to work. Gave pt new appt date/times

## 2016-03-18 NOTE — Progress Notes (Signed)
Subjective:     Patient ID: Rodney [name], male   DOB: December 20, 1940, 76 y.o.   MRN: [id number]  HPI patient states that his nail is healing okay but he has some irritation between the big toe and the second toe that superficial with the nail itself showing localized drainage with no proximal edema erythema   Review of Systems     Objective:   Physical Exam Neurovascular status intact with well-healing site right hallux with a bed that is healing and a relatively normal fashion with no proximal edema erythema with irritation of the second digit right with reduced pressure between the 2 toes that measures about 3 x 3 mm    Assessment:     Localized abrasion of the right second toe secondary to compression of the 2 toes with soaks and dressing to the big toe with the most part normal healing of the big toe    Plan:     H&P conditions discussed and discussed continued soaks therapy. I did apply padding to the right second toe and showed him how to do this at home to separate the digits and applied Silvadene and gave strict instructions if any proximal erythema edema drainage or any systemic signs of infection were to occur to immediately let us know. Should heal uneventfully

## 2016-03-22 ENCOUNTER — Encounter (HOSPITAL_COMMUNITY): Payer: Self-pay | Admitting: Emergency Medicine

## 2016-03-22 ENCOUNTER — Emergency Department (HOSPITAL_COMMUNITY)
Admission: EM | Admit: 2016-03-22 | Discharge: 2016-03-22 | Disposition: A | Discharge status: Home / Self Care | Payer: BLUE CROSS/BLUE SHIELD | Attending: Emergency Medicine | Admitting: Emergency Medicine

## 2016-03-22 DIAGNOSIS — R69 Illness, unspecified: Secondary | ICD-10-CM

## 2016-03-22 DIAGNOSIS — Z8546 Personal history of malignant neoplasm of prostate: Secondary | ICD-10-CM | POA: Insufficient documentation

## 2016-03-22 DIAGNOSIS — E039 Hypothyroidism, unspecified: Secondary | ICD-10-CM | POA: Insufficient documentation

## 2016-03-22 DIAGNOSIS — R52 Pain, unspecified: Secondary | ICD-10-CM | POA: Diagnosis present

## 2016-03-22 DIAGNOSIS — J111 Influenza due to unidentified influenza virus with other respiratory manifestations: Secondary | ICD-10-CM | POA: Insufficient documentation

## 2016-03-22 DIAGNOSIS — Z7901 Long term (current) use of anticoagulants: Secondary | ICD-10-CM | POA: Insufficient documentation

## 2016-03-22 DIAGNOSIS — L03032 Cellulitis of left toe: Secondary | ICD-10-CM | POA: Insufficient documentation

## 2016-03-22 DIAGNOSIS — Z87891 Personal history of nicotine dependence: Secondary | ICD-10-CM | POA: Diagnosis not present

## 2016-03-22 LAB — CBC WITH DIFFERENTIAL/PLATELET
BASOS ABS: 0.1 10*3/uL (ref 0.0–0.1)
BASOS PCT: 2 %
Eosinophils Absolute: 0.2 10*3/uL (ref 0.0–0.7)
Eosinophils Relative: 6 %
HEMATOCRIT: 43 % (ref 39.0–52.0)
HEMOGLOBIN: 14.7 g/dL (ref 13.0–17.0)
LYMPHS PCT: 18 %
Lymphs Abs: 0.5 10*3/uL — ABNORMAL LOW (ref 0.7–4.0)
MCH: 32.4 pg (ref 26.0–34.0)
MCHC: 34.2 g/dL (ref 30.0–36.0)
MCV: 94.7 fL (ref 78.0–100.0)
MONO ABS: 0.6 10*3/uL (ref 0.1–1.0)
Monocytes Relative: 19 %
NEUTROS ABS: 1.6 10*3/uL — AB (ref 1.7–7.7)
NEUTROS PCT: 55 %
Platelets: 157 10*3/uL (ref 150–400)
RBC: 4.54 MIL/uL (ref 4.22–5.81)
RDW: 15.6 % — AB (ref 11.5–15.5)
WBC: 2.8 10*3/uL — ABNORMAL LOW (ref 4.0–10.5)

## 2016-03-22 LAB — BASIC METABOLIC PANEL
ANION GAP: 5 (ref 5–15)
BUN: 20 mg/dL (ref 6–20)
CALCIUM: 7.7 mg/dL — AB (ref 8.9–10.3)
CO2: 22 mmol/L (ref 22–32)
Chloride: 109 mmol/L (ref 101–111)
Creatinine, Ser: 1.02 mg/dL (ref 0.61–1.24)
GFR calc Af Amer: >60 mL/min (ref >60)
GLUCOSE: 98 mg/dL (ref 65–99)
POTASSIUM: 3.9 mmol/L (ref 3.5–5.1)
Sodium: 136 mmol/L (ref 135–145)

## 2016-03-22 LAB — PROTIME-INR
INR: 2.42
Prothrombin Time: 26.8 seconds — ABNORMAL HIGH (ref 11.4–15.2)

## 2016-03-22 MED ORDER — ONDANSETRON HCL 4 MG/2ML IJ SOLN
4.0000 mg | Freq: Once | INTRAMUSCULAR | Status: AC
  Administered 2016-03-22: 4 mg via INTRAVENOUS
  Filled 2016-03-22: qty 2

## 2016-03-22 MED ORDER — SODIUM CHLORIDE 0.9 % IV BOLUS (SEPSIS)
1000.0000 mL | Freq: Once | INTRAVENOUS | Status: AC
  Administered 2016-03-22: 1000 mL via INTRAVENOUS

## 2016-03-22 MED ORDER — ONDANSETRON 4 MG PO TBDP: 4.0000 mg | ORAL_TABLET | Freq: Three times a day (TID) | ORAL | 0 refills | Status: DC | PRN

## 2016-03-22 MED ORDER — OSELTAMIVIR PHOSPHATE 75 MG PO CAPS
75.0000 mg | ORAL_CAPSULE | Freq: Once | ORAL | Status: AC
  Administered 2016-03-22: 75 mg via ORAL
  Filled 2016-03-22: qty 1

## 2016-03-22 MED ORDER — BACITRACIN ZINC 500 UNIT/GM EX OINT: 1.0000 "application " | TOPICAL_OINTMENT | Freq: Two times a day (BID) | CUTANEOUS | 0 refills | Status: DC

## 2016-03-22 MED ORDER — OSELTAMIVIR PHOSPHATE 75 MG PO CAPS: 75.0000 mg | ORAL_CAPSULE | Freq: Two times a day (BID) | ORAL | 0 refills | Status: DC

## 2016-03-22 NOTE — ED Provider Notes (Signed)
[facility] DEPT Provider Note   CSN: [id number] Arrival date & time: 03/22/16  1443     History   Chief Complaint Chief Complaint  Patient presents with  . Nausea  . Generalized Body Aches    HPI Rodney [name] is a 76 y.o. male.  HPI  76 year old male presents with concern for influenza. Woke up this AM with diarrhea and fever (100.5). Has had mild dry cough, mild sore throat and neck pain. No headache. Some shoulder aching but no diffuse body aches. Some nausea without vomiting. No chest pain or shortness of breath. He is in remission from multiple myeloma s/p stem cell transplant. Currently on oral chemo. Took tylenol this AM. No bloody stools. Mild periumbical abd pain that worsens with the diarrhea.   Past Medical History:  Diagnosis Date  . Anemia 02-16-11   01-05-11- post surgery-Transfusions x 4 units  . Bone pain 02/18/2013  . Complication of anesthesia 02-16-11   Pt. speaks of awakening during the surgery from back  surgery  . Degenerative disc disease 02-16-11   01-05-11 L3 fusion done  . Esophageal reflux 05/31/2008   Qualifier: Diagnosis of  By: Arnoldo Morale MD, Balinda Quails   . Hernia 02-16-11   left inguinal hernia at present  . Hypothyroidism 02/01/2014  . Multiple myeloma 02-16-11   suspected tumor  left sacral  . Pancreatitis   . Personal history of traumatic fracture 07/10/2012  . Prostate cancer ([place])   . Pulmonary embolism ([place])   . Second degree atrioventricular block 11/26/2012  . Shortness of breath 02-16-11   hx. Pulmonary emboli x2 (9 yrs/ 3'12 -last)    Patient Active Problem List   Diagnosis Date Noted  . Encounter for well adult exam with abnormal findings 02/17/2016  . Chronic back pain 02/17/2016  . Bilateral carpal tunnel syndrome 01/24/2016  . Right foot pain 10/05/2015  . Tendonitis of ankle, left 10/03/2015  . Encounter for therapeutic drug monitoring 08/12/2014  . Hypothyroidism 02/01/2014  . Allergic rhinitis 02/01/2014  . Bone pain  02/18/2013  . Multiple myeloma in remission ([place]) 07/10/2012  . Back pain, lumbosacral 02/11/2012  . Esophageal reflux 05/31/2008  . ACUT DUOD ULCER W/O MENTION HEMORR/PERF W/OBST 01/05/2008  . PROSTATE CANCER, HX OF 01/05/2008  . Pulmonary embolism ([place]) 01/05/2008    Past Surgical History:  Procedure Laterality Date  . BACK SURGERY  02-16-11    01-05-11 Lumbar surgery L3(complicated by loss of blood volume)/ 01-09-11 then Lumbar fusion done with retained  hardware   . CHOLECYSTECTOMY  04/06/2010   laparoscopic-inflammation with stones  . HERNIA REPAIR  02/20/11   Inguinal hernia repair w/mesh  . INGUINAL HERNIA REPAIR  02/20/2011   Procedure: HERNIA REPAIR INGUINAL ADULT;  Surgeon: Earnstine Regal, MD;  Location: [facility];  Service: General;  Laterality: Left;  Repair Left Inguinal Hernia with Mesh  . PROSTATE SURGERY Bilateral 2011       Home Medications    Prior to Admission medications   Medication Sig Start Date End Date Taking? Authorizing Provider  calcium carbonate (TUMS - DOSED IN MG ELEMENTAL CALCIUM) 500 MG chewable tablet Chew 1 tablet by mouth every 2 (two) hours as needed. HEART BURN     Yes Historical Provider, MD  Cholecalciferol (VITAMIN D) 1000 UNITS capsule Take 4,000 Units by mouth daily.    Yes Historical Provider, MD  cyclobenzaprine (FLEXERIL) 10 MG tablet Take 1 tablet (10 mg total) by mouth 3 (three) times daily as needed for  muscle spasms. 03/09/16  Yes Corwin Levins, MD  gabapentin (NEURONTIN) 300 MG capsule Take 300 mg by mouth daily as needed. Nerve pain 02/10/16  Yes Historical Provider, MD  HYDROcodone-acetaminophen (NORCO/VICODIN) 5-325 MG tablet Take 1-2 tablets by mouth every 6 (six) hours as needed for moderate pain.   Yes Historical Provider, MD  levothyroxine (SYNTHROID, LEVOTHROID) 100 MCG tablet Take 1 tablet (100 mcg total) by mouth daily. 02/20/16  Yes Corwin Levins, MD  omeprazole (PRILOSEC) 20 MG capsule Take 20 mg by mouth 2 (two) times daily before  a meal. HEART BURN    Yes Historical Provider, MD  ondansetron (ZOFRAN) 4 MG tablet Take 1 tablet (4 mg total) by mouth every 8 (eight) hours as needed for nausea or vomiting. 06/03/15  Yes Terressa Koyanagi, DO  POTASSIUM PO Take 1 tablet by mouth daily.   Yes Historical Provider, MD  REVLIMID 10 MG capsule Take 10 mg by mouth daily. 21 days on and 7 days off 05/16/12  Yes Historical Provider, MD  warfarin (COUMADIN) 5 MG tablet TAKE BY MOUTH AS DIRECTED BY ANTICOAGULATION CLINIC Patient taking differently: Take 5 mg by mouth daily at 6 PM. TAKE BY MOUTH AS DIRECTED BY ANTICOAGULATION CLINIC 01/30/16  Yes Terressa Koyanagi, DO  bacitracin ointment Apply 1 application topically 2 (two) times daily. To the left great toe for 7 days 03/22/16   Pricilla Loveless, MD  DULoxetine (CYMBALTA) 60 MG capsule Take 1 capsule (60 mg total) by mouth daily. Patient not taking: Reported on 03/22/2016 03/09/16   Corwin Levins, MD  ondansetron (ZOFRAN ODT) 4 MG disintegrating tablet Take 1 tablet (4 mg total) by mouth every 8 (eight) hours as needed for nausea or vomiting. 03/22/16   Pricilla Loveless, MD  oseltamivir (TAMIFLU) 75 MG capsule Take 1 capsule (75 mg total) by mouth every 12 (twelve) hours. 03/22/16   Pricilla Loveless, MD    Family History Family History  Problem Relation Age of Onset  . Lymphoma Father 60  . Cancer Father   . Clotting disorder Mother     blood clots  . Cancer Brother     throat ca    Social History Social History  Substance Use Topics  . Smoking status: Former Smoker    Quit date: 02/15/1969  . Smokeless tobacco: Never Used     Comment: quit 50 yrs  . Alcohol use No     Allergies   Blood-group specific substance   Review of Systems Review of Systems  Constitutional: Positive for fever.  HENT: Positive for sore throat.   Respiratory: Positive for cough. Negative for shortness of breath.   Cardiovascular: Negative for chest pain.  Gastrointestinal: Positive for abdominal pain, diarrhea  and nausea. Negative for vomiting.  Genitourinary: Negative for dysuria.  Musculoskeletal: Positive for myalgias and neck pain.  Neurological: Negative for headaches.  All other systems reviewed and are negative.    Physical Exam Updated Vital Signs BP 103/69   Pulse 77   Temp 97.6 F (36.4 C) (Oral)   Resp 16   SpO2 95%   Physical Exam  Constitutional: He is oriented to person, place, and time. He appears well-developed and well-nourished.  HENT:  Head: Normocephalic and atraumatic.  Right Ear: External ear normal.  Left Ear: External ear normal.  Nose: Nose normal.  Mouth/Throat: No oropharyngeal exudate.  Eyes: Right eye exhibits no discharge. Left eye exhibits no discharge.  Neck: Normal range of motion. Neck supple.  Mild bilateral  neck tenderness  Cardiovascular: Normal rate, regular rhythm and normal heart sounds.   Pulmonary/Chest: Effort normal and breath sounds normal. He has no wheezes. He has no rales.  Abdominal: Soft. There is tenderness (mild) in the periumbilical area. There is no rigidity, no rebound and no guarding.  Musculoskeletal: He exhibits no edema.  Neurological: He is alert and oriented to person, place, and time.  Skin: Skin is warm and dry.  Nursing note and vitals reviewed.    ED Treatments / Results  Labs (all labs ordered are listed, but only abnormal results are displayed) Labs Reviewed  BASIC METABOLIC PANEL - Abnormal; Notable for the following:       Result Value   Calcium 7.7 (*)    All other components within normal limits  CBC WITH DIFFERENTIAL/PLATELET - Abnormal; Notable for the following:    WBC 2.8 (*)    RDW 15.6 (*)    Neutro Abs 1.6 (*)    Lymphs Abs 0.5 (*)    All other components within normal limits  PROTIME-INR - Abnormal; Notable for the following:    Prothrombin Time 26.8 (*)    All other components within normal limits    EKG  EKG Interpretation None       Radiology No results  found.  Procedures Procedures (including critical care time)  Medications Ordered in ED Medications  sodium chloride 0.9 % bolus 1,000 mL (0 mLs Intravenous Stopped 03/22/16 1221)  ondansetron (ZOFRAN) injection 4 mg (4 mg Intravenous Given 03/22/16 1127)  oseltamivir (TAMIFLU) capsule 75 mg (75 mg Oral Given 03/22/16 1127)     Initial Impression / Assessment and Plan / ED Course  I have reviewed the triage vital signs and the nursing notes.  Pertinent labs & imaging results that were available during my care of the patient were reviewed by me and considered in my medical decision making (see chart for details).  Clinical Course as of Mar 22 1710  Thu Mar 22, 2016  1103 Patient appears to have a flulike illness. Otherwise appears well at this time. Very mild abdominal pain is likely from the diarrhea. He's afebrile at this time although this is status post Tylenol. We'll give fluids, check electrolytes, INR, and start Tamiflu. Given his age and that he is in remission from multiple myeloma currently on oral chemotherapy a think he meets qualifications for Tamiflu. No signs of a bacterial illness at this time.  [SG]    Clinical Course User Index [SG] Sherwood Gambler, MD    INR is therapeutic. Electrodes unremarkable besides mild hypocalcemia. He was given fluids and Zofran. Feels better. White count is mildly low but no evidence of neutropenia. He is afebrile here. Given that patient is on oral chemotherapy and I have high concern for influenza, he will be treated with Tamiflu. Even the first dose in the ED. He has clear lungs with a very minimal cough that started this morning I do not think chest x-ray is needed at this time. Plan for discharge home with symptomatically or and strict return precautions. Follow-up with his oncologist. Advised to call his oncologist if he spikes a fever over 101.  Final Clinical Impressions(s) / ED Diagnoses   Final diagnoses:  Influenza-like illness   Cellulitis of great toe of left foot    New Prescriptions Discharge Medication List as of 03/22/2016 12:00 PM    START taking these medications   Details  bacitracin ointment Apply 1 application topically 2 (two) times daily. To the  left great toe for 7 days, Starting Thu 03/22/2016, Print    ondansetron (ZOFRAN ODT) 4 MG disintegrating tablet Take 1 tablet (4 mg total) by mouth every 8 (eight) hours as needed for nausea or vomiting., Starting Thu 03/22/2016, Print    oseltamivir (TAMIFLU) 75 MG capsule Take 1 capsule (75 mg total) by mouth every 12 (twelve) hours., Starting Thu 03/22/2016, Print         Sherwood Gambler, MD 03/22/16 [phone]

## 2016-03-22 NOTE — ED Triage Notes (Signed)
Pt reports generalized body aches, nausea, and diarrhea since 0100 this am. No emesis.

## 2016-03-27 ENCOUNTER — Telehealth: Payer: Self-pay | Admitting: Hematology and Oncology

## 2016-03-27 NOTE — Telephone Encounter (Signed)
Patient came to scheduling to retrieve calendars with future scheduled appointments.  °

## 2016-03-29 ENCOUNTER — Ambulatory Visit (INDEPENDENT_AMBULATORY_CARE_PROVIDER_SITE_OTHER): Payer: Medicare Other | Admitting: Podiatry

## 2016-03-29 DIAGNOSIS — L03031 Cellulitis of right toe: Secondary | ICD-10-CM | POA: Diagnosis not present

## 2016-03-29 NOTE — Progress Notes (Signed)
Subjective:     Patient ID: Rodney [name], male   DOB: Oct 03, 1940, 76 y.o.   MRN: [id number]  HPI patient states that he was concerned about the discoloration of his right big toe with no active drainage but his wife was concerned about the color   Review of Systems     Objective:   Physical Exam Neurovascular status unchanged with patient found to have crusted tissue around the right big toe localized in nature with no active drainage noted and no proximal edema erythema drainage noted. There is no blistering noted at this time and the area does appear to have a dried blood appearance    Assessment:     Probability for trauma creating a dried blood appearance with no active indications of infection    Plan:     I explained to him the appearance of dried blood that can be scary but I do not see any indications of infection or drainage and I gave advised that this should heal uneventfully and today I cleaned up tissue showing nice pink skin underneath. I then discussed continued soaks and if any drainage were to occur erythema or any other foreign metal changes he is to reappoint for evaluation immediately

## 2016-04-02 ENCOUNTER — Telehealth: Payer: Self-pay | Admitting: Internal Medicine

## 2016-04-05 ENCOUNTER — Ambulatory Visit (INDEPENDENT_AMBULATORY_CARE_PROVIDER_SITE_OTHER): Payer: Medicare Other | Admitting: Internal Medicine

## 2016-04-05 ENCOUNTER — Encounter: Payer: Self-pay | Admitting: Internal Medicine

## 2016-04-05 VITALS — BP 130/68 | HR 71 | Temp 98.3°F | Resp 20 | Wt 203.0 lb

## 2016-04-05 DIAGNOSIS — M25551 Pain in right hip: Secondary | ICD-10-CM | POA: Diagnosis not present

## 2016-04-05 DIAGNOSIS — G5603 Carpal tunnel syndrome, bilateral upper limbs: Secondary | ICD-10-CM | POA: Diagnosis not present

## 2016-04-05 DIAGNOSIS — J069 Acute upper respiratory infection, unspecified: Secondary | ICD-10-CM | POA: Diagnosis not present

## 2016-04-05 DIAGNOSIS — M25552 Pain in left hip: Secondary | ICD-10-CM | POA: Diagnosis not present

## 2016-04-05 NOTE — Progress Notes (Signed)
Subjective:    Patient ID: Rodney [name], male    DOB: 1941/02/11, 76 y.o.   MRN: [id number]  HPI   Here with 2-3 days acute onset fever, facial pain, pressure, headache, general weakness and malaise, and clearish d/c, with mild ST and cough, but pt denies chest pain, wheezing, increased sob or doe, orthopnea, PND, increased LE swelling, palpitations, dizziness or syncope.  Has bilat CTS symptoms but has not sought tx, ongoing for 3 mo, now with persistent numbness, ? Mild weakness/loss of grip strength.  Also has bilat hip pains, worse to walk, ongoing for almost 1 yr, sharp, worse to walk, no falls, no pain to sit Past Medical History:  Diagnosis Date  . Anemia 02-16-11   01-05-11- post surgery-Transfusions x 4 units  . Bone pain 02/18/2013  . Complication of anesthesia 02-16-11   Pt. speaks of awakening during the surgery from back  surgery  . Degenerative disc disease 02-16-11   01-05-11 L3 fusion done  . Esophageal reflux 05/31/2008   Qualifier: Diagnosis of  By: Arnoldo Morale MD, Balinda Quails   . Hernia 02-16-11   left inguinal hernia at present  . Hypothyroidism 02/01/2014  . Multiple myeloma 02-16-11   suspected tumor  left sacral  . Pancreatitis   . Personal history of traumatic fracture 07/10/2012  . Prostate cancer ([place])   . Pulmonary embolism ([place])   . Second degree atrioventricular block 11/26/2012  . Shortness of breath 02-16-11   hx. Pulmonary emboli x2 (9 yrs/ 3'12 -last)   Past Surgical History:  Procedure Laterality Date  . BACK SURGERY  02-16-11    01-05-11 Lumbar surgery L3(complicated by loss of blood volume)/ 01-09-11 then Lumbar fusion done with retained  hardware   . CHOLECYSTECTOMY  04/06/2010   laparoscopic-inflammation with stones  . HERNIA REPAIR  02/20/11   Inguinal hernia repair w/mesh  . INGUINAL HERNIA REPAIR  02/20/2011   Procedure: HERNIA REPAIR INGUINAL ADULT;  Surgeon: Earnstine Regal, MD;  Location: [facility];  Service: General;  Laterality: Left;  Repair Left  Inguinal Hernia with Mesh  . PROSTATE SURGERY Bilateral 2011    reports that he quit smoking about 47 years ago. He has never used smokeless tobacco. He reports that he does not drink alcohol or use drugs. family history includes Cancer in his brother and father; Clotting disorder in his mother; Lymphoma (age of onset: 66) in his father. Allergies  Allergen Reactions  . Blood-Group Specific Substance     Must be Leukocyte Reduced and Irradiated due to stem cell transplant.    Current Outpatient Prescriptions on File Prior to Visit  Medication Sig Dispense Refill  . bacitracin ointment Apply 1 application topically 2 (two) times daily. To the left great toe for 7 days 120 g 0  . calcium carbonate (TUMS - DOSED IN MG ELEMENTAL CALCIUM) 500 MG chewable tablet Chew 1 tablet by mouth every 2 (two) hours as needed. HEART BURN      . Cholecalciferol (VITAMIN D) 1000 UNITS capsule Take 4,000 Units by mouth daily.     . cyclobenzaprine (FLEXERIL) 10 MG tablet Take 1 tablet (10 mg total) by mouth 3 (three) times daily as needed for muscle spasms. 30 tablet 3  . DULoxetine (CYMBALTA) 60 MG capsule Take 1 capsule (60 mg total) by mouth daily. 90 capsule 3  . gabapentin (NEURONTIN) 300 MG capsule Take 300 mg by mouth daily as needed. Nerve pain    . HYDROcodone-acetaminophen (NORCO/VICODIN) 5-325 MG tablet Take  1-2 tablets by mouth every 6 (six) hours as needed for moderate pain.    Marland Kitchen levothyroxine (SYNTHROID, LEVOTHROID) 100 MCG tablet Take 1 tablet (100 mcg total) by mouth daily. 90 tablet 3  . omeprazole (PRILOSEC) 20 MG capsule Take 20 mg by mouth 2 (two) times daily before a meal. HEART BURN     . ondansetron (ZOFRAN ODT) 4 MG disintegrating tablet Take 1 tablet (4 mg total) by mouth every 8 (eight) hours as needed for nausea or vomiting. 10 tablet 0  . ondansetron (ZOFRAN) 4 MG tablet Take 1 tablet (4 mg total) by mouth every 8 (eight) hours as needed for nausea or vomiting. 20 tablet 0  .  oseltamivir (TAMIFLU) 75 MG capsule Take 1 capsule (75 mg total) by mouth every 12 (twelve) hours. 10 capsule 0  . POTASSIUM PO Take 1 tablet by mouth daily.    Marland Kitchen REVLIMID 10 MG capsule Take 10 mg by mouth daily. 21 days on and 7 days off    . warfarin (COUMADIN) 5 MG tablet TAKE BY MOUTH AS DIRECTED BY ANTICOAGULATION CLINIC (Patient taking differently: Take 5 mg by mouth daily at 6 PM. TAKE BY MOUTH AS DIRECTED BY ANTICOAGULATION CLINIC) 30 tablet 3   No current facility-administered medications on file prior to visit.    Review of Systems  Constitutional: Negative for unusual diaphoresis or night sweats HENT: Negative for ear swelling or discharge Eyes: Negative for worsening visual haziness  Respiratory: Negative for choking and stridor.   Gastrointestinal: Negative for distension or worsening eructation Genitourinary: Negative for retention or change in urine volume.  Musculoskeletal: Negative for other MSK pain or swelling Skin: Negative for color change and worsening wound Neurological: Negative for tremors and numbness other than noted  Psychiatric/Behavioral: Negative for decreased concentration or agitation other than above   All other system neg per pt    Objective:   Physical Exam BP 130/68   Pulse 71   Temp 98.3 F (36.8 C) (Oral)   Resp 20   Wt 203 lb (92.1 kg)   SpO2 96%   BMI 28.31 kg/m  VS noted,  Constitutional: Pt appears in no apparent distress HENT: Head: NCAT.  Right Ear: External ear normal.  Left Ear: External ear normal.  Eyes: . Pupils are equal, round, and reactive to light. Conjunctivae and EOM are normal Neck: Normal range of motion. Neck supple.  Cardiovascular: Normal rate and regular rhythm.   Pulmonary/Chest: Effort normal and breath sounds without rales or wheezing.  Abd:  Soft, NT, ND, + BS Neurological: Pt is alert. Not confused , motor grossly intact, decreased sens to LT to hands, no atrophy Skin: Skin is warm. No rash, no LE  edema Psychiatric: Pt behavior is normal. No agitation.  Bilat hips with decreased ROM, pain with passive movement    Assessment & Plan:

## 2016-04-05 NOTE — Progress Notes (Signed)
Pre visit review using our clinic review tool, if applicable. No additional management support is needed unless otherwise documented below in the visit note. 

## 2016-04-05 NOTE — Patient Instructions (Addendum)
Please continue all other medications as before, and refills have been done if requested.  Please have the pharmacy call with any other refills you may need.  Please keep your appointments with your specialists as you may have planned  Please call if you would need referral to a Hand Surgeon, or Dr Tamala Julian in this office for the hips

## 2016-04-06 ENCOUNTER — Ambulatory Visit (INDEPENDENT_AMBULATORY_CARE_PROVIDER_SITE_OTHER): Payer: Medicare Other | Admitting: General Practice

## 2016-04-06 DIAGNOSIS — M25552 Pain in left hip: Secondary | ICD-10-CM | POA: Insufficient documentation

## 2016-04-06 DIAGNOSIS — M25551 Pain in right hip: Secondary | ICD-10-CM | POA: Insufficient documentation

## 2016-04-06 DIAGNOSIS — Z5181 Encounter for therapeutic drug level monitoring: Secondary | ICD-10-CM

## 2016-04-06 DIAGNOSIS — J069 Acute upper respiratory infection, unspecified: Secondary | ICD-10-CM | POA: Insufficient documentation

## 2016-04-06 LAB — POCT INR: INR: 3.1

## 2016-04-06 NOTE — Progress Notes (Signed)
I have reviewed and agree with the plan. 

## 2016-04-06 NOTE — Assessment & Plan Note (Signed)
Persistent symptomatic, pt quite stoic, for some reason does not want hand surgury referral, for bilat wrist splints at night, nsaid prn

## 2016-04-06 NOTE — Assessment & Plan Note (Signed)
Mild, likely viral, ok to follow with mucinex, tylenol, delsym otc prn,  to f/u any worsening symptoms or concerns

## 2016-04-06 NOTE — Assessment & Plan Note (Signed)
?   DJD related, pt again stoic, declines films or referral to sport med for this

## 2016-04-06 NOTE — Patient Instructions (Signed)
Pre visit review using our clinic review tool, if applicable. No additional management support is needed unless otherwise documented below in the visit note. 

## 2016-04-16 DIAGNOSIS — R6889 Other general symptoms and signs: Secondary | ICD-10-CM | POA: Diagnosis not present

## 2016-04-16 DIAGNOSIS — C9000 Multiple myeloma not having achieved remission: Secondary | ICD-10-CM | POA: Diagnosis not present

## 2016-04-23 ENCOUNTER — Ambulatory Visit (HOSPITAL_BASED_OUTPATIENT_CLINIC_OR_DEPARTMENT_OTHER): Payer: Medicare Other | Admitting: Hematology and Oncology

## 2016-04-23 ENCOUNTER — Ambulatory Visit: Payer: Medicare Other

## 2016-04-23 DIAGNOSIS — G5603 Carpal tunnel syndrome, bilateral upper limbs: Secondary | ICD-10-CM | POA: Diagnosis not present

## 2016-04-23 DIAGNOSIS — M898X9 Other specified disorders of bone, unspecified site: Secondary | ICD-10-CM

## 2016-04-23 DIAGNOSIS — R229 Localized swelling, mass and lump, unspecified: Secondary | ICD-10-CM | POA: Insufficient documentation

## 2016-04-23 DIAGNOSIS — M549 Dorsalgia, unspecified: Secondary | ICD-10-CM

## 2016-04-23 DIAGNOSIS — M2550 Pain in unspecified joint: Secondary | ICD-10-CM | POA: Diagnosis not present

## 2016-04-23 DIAGNOSIS — C9001 Multiple myeloma in remission: Secondary | ICD-10-CM | POA: Diagnosis not present

## 2016-04-24 ENCOUNTER — Encounter: Payer: Self-pay | Admitting: Hematology and Oncology

## 2016-04-24 NOTE — Assessment & Plan Note (Signed)
He has had some intermittent numbness affecting the median nerve distribution on both hands, compatible with carpal tunnel syndrome. The patient is to work full-time. I recommend consideration of cutting back on his work hours as I suspect the carpal tunnel syndrome is related to his work. In the meantime, continue conservative management

## 2016-04-24 NOTE — Assessment & Plan Note (Addendum)
According to the patient, he had labs drawn in [place] which showed complete remission He gets Revlimid through his oncologist in [place] would be to continue Revlimid indefinitely and Zometa every 3 months. The patient has discussed with his other oncologist about potential stopping treatment. I would defer to his other oncologist decision to stop treatment. We discussed the incurable nature of multiple myeloma. He has recent dental clearance with no concerns for osteonecrosis of the jaw. He will continue calcium with vitamin D supplements.Due to significant bone pain, I recommend we stop Zometa today.  I recommend consideration of switching Zometa to twice a year as preventive dose only

## 2016-04-24 NOTE — Assessment & Plan Note (Signed)
He has intermittent, chronic pain in his back and joints. Recommend conservative management for now. He is on high-dose vitamin D and calcium. He felt intermittent flare of bone pain after each dose of Zometa. I would not proceed with Zometa and recommend him to discuss further with his oncologist in [place].

## 2016-04-24 NOTE — Assessment & Plan Note (Signed)
He complained of new lump near his hip joint. On examination, he felt like a lipoma. He desired to have it checked by a surgeon. He had hernia repair by Dr. Harlow Asa in the past. I will put a referral to Dr. Harlow Asa to evaluate this

## 2016-04-24 NOTE — Progress Notes (Signed)
[place] OFFICE PROGRESS NOTE  Patient Care Team: Biagio Borg, MD as PCP - General (Internal Medicine) Heath Lark, MD as Consulting Physician (Hematology and Oncology) Terrilyn Saver, MD as Consulting Physician (Oncology)  SUMMARY OF ONCOLOGIC HISTORY:   The patient was initially diagnosed in 2012. He had chemotherapy followed by autologous stem cell transplant [place] around October 2013. In April 2014 he was started on maintenance Revlimid 10 mg daily by mouth along with monthly Zometa. He receives his Revlimid through his physician from [place]. He is on warfarin therapy indefinitely, INR monitored by other physicians Starting on 05/18/2013, his Zometa is change to every 3 months.  INTERVAL HISTORY: Please see below for problem oriented charting. He is accompanied by his wife. He complained of intermittent bone pain, worse after each dose of Zometa. Recently, he had flulike illness, resolved spontaneously. He complained of new palpable lump near his hip area.  It does not bother him. He also complained of numbness and tingling sensation  in both hands, in the median nerve distribution. He denies recent dental issues. The patient denies any recent signs or symptoms of bleeding such as spontaneous epistaxis, hematuria or hematochezia.  REVIEW OF SYSTEMS:   Constitutional: Denies fevers, chills or abnormal weight loss Eyes: Denies blurriness of vision Ears, nose, mouth, throat, and face: Denies mucositis or sore throat Respiratory: Denies cough, dyspnea or wheezes Cardiovascular: Denies palpitation, chest discomfort or lower extremity swelling Gastrointestinal:  Denies nausea, heartburn or change in bowel habits Skin: Denies abnormal skin rashes Lymphatics: Denies new lymphadenopathy or easy bruising Behavioral/Psych: Mood is stable, no new changes  All other systems were reviewed with the patient and are negative.  I have  reviewed the past medical history, past surgical history, social history and family history with the patient and they are unchanged from previous note.  ALLERGIES:  is allergic to blood-group specific substance.  MEDICATIONS:  Current Outpatient Prescriptions  Medication Sig Dispense Refill  . calcium carbonate (TUMS - DOSED IN MG ELEMENTAL CALCIUM) 500 MG chewable tablet Chew 1 tablet by mouth every 2 (two) hours as needed. HEART BURN      . Cholecalciferol (VITAMIN D) 1000 UNITS capsule Take 4,000 Units by mouth daily.     . cyclobenzaprine (FLEXERIL) 10 MG tablet Take 1 tablet (10 mg total) by mouth 3 (three) times daily as needed for muscle spasms. 30 tablet 3  . gabapentin (NEURONTIN) 300 MG capsule Take 300 mg by mouth daily as needed. Nerve pain    . levothyroxine (SYNTHROID, LEVOTHROID) 100 MCG tablet Take 1 tablet (100 mcg total) by mouth daily. 90 tablet 3  . omeprazole (PRILOSEC) 20 MG capsule Take 20 mg by mouth 2 (two) times daily before a meal. HEART BURN     . ondansetron (ZOFRAN ODT) 4 MG disintegrating tablet Take 1 tablet (4 mg total) by mouth every 8 (eight) hours as needed for nausea or vomiting. 10 tablet 0  . ondansetron (ZOFRAN) 4 MG tablet Take 1 tablet (4 mg total) by mouth every 8 (eight) hours as needed for nausea or vomiting. 20 tablet 0  . POTASSIUM PO Take 1 tablet by mouth daily.    Marland Kitchen REVLIMID 10 MG capsule Take 10 mg by mouth daily. 21 days on and 7 days off    . warfarin (COUMADIN) 5 MG tablet TAKE BY MOUTH AS DIRECTED BY ANTICOAGULATION CLINIC (Patient taking differently: Take 5 mg by mouth daily at 6 PM. TAKE BY  MOUTH AS DIRECTED BY ANTICOAGULATION CLINIC) 30 tablet 3  . bacitracin ointment Apply 1 application topically 2 (two) times daily. To the left great toe for 7 days (Patient not taking: Reported on 04/23/2016) 120 g 0  . DULoxetine (CYMBALTA) 60 MG capsule Take 1 capsule (60 mg total) by mouth daily. (Patient not taking: Reported on 04/23/2016) 90 capsule  3  . HYDROcodone-acetaminophen (NORCO/VICODIN) 5-325 MG tablet Take 1-2 tablets by mouth every 6 (six) hours as needed for moderate pain.     No current facility-administered medications for this visit.     PHYSICAL EXAMINATION: ECOG PERFORMANCE STATUS: 1 - Symptomatic but completely ambulatory  Vitals:   04/23/16 1352  BP: 111/73  Pulse: 64  Resp: 18  Temp: 97.6 F (36.4 C)   Filed Weights   04/23/16 1352  Weight: 203 lb 8 oz (92.3 kg)    GENERAL:alert, no distress and comfortable SKIN: He has palpable skin lump near the right hip, suspicious for lipoma EYES: normal, Conjunctiva are pink and non-injected, sclera clear OROPHARYNX:no exudate, no erythema and lips, buccal mucosa, and tongue normal  NECK: supple, thyroid normal size, non-tender, without nodularity LYMPH:  no palpable lymphadenopathy in the cervical, axillary or inguinal LUNGS: clear to auscultation and percussion with normal breathing effort HEART: regular rate & rhythm and no murmurs and no lower extremity edema ABDOMEN:abdomen soft, non-tender and normal bowel sounds Musculoskeletal:no cyanosis of digits and no clubbing  NEURO: alert & oriented x 3 with fluent speech, no focal motor/sensory deficits  LABORATORY DATA:  I have reviewed the data as listed    Component Value Date/Time   NA 136 03/22/2016 1100   NA 140 11/18/2013 0756   K 3.9 03/22/2016 1100   K 4.0 11/18/2013 0756   CL 109 03/22/2016 1100   CL 112 (H) 08/27/2012 1309   CO2 22 03/22/2016 1100   CO2 20 (L) 11/18/2013 0756   GLUCOSE 98 03/22/2016 1100   GLUCOSE 88 11/18/2013 0756   GLUCOSE 100 (H) 08/27/2012 1309   BUN 20 03/22/2016 1100   BUN 18.0 11/18/2013 0756   CREATININE 1.02 03/22/2016 1100   CREATININE 1.3 11/18/2013 0756   CALCIUM 7.7 (L) 03/22/2016 1100   CALCIUM 8.7 11/18/2013 0756   PROT 6.7 02/17/2016 1517   PROT 7.0 11/18/2013 0756   ALBUMIN 4.0 02/17/2016 1517   ALBUMIN 3.4 (L) 11/18/2013 0756   AST 24 02/17/2016 1517    AST 23 11/18/2013 0756   ALT 31 02/17/2016 1517   ALT 33 11/18/2013 0756   ALKPHOS 110 02/17/2016 1517   ALKPHOS 83 11/18/2013 0756   BILITOT 0.4 02/17/2016 1517   BILITOT 0.59 11/18/2013 0756   GFRNONAA >60 03/22/2016 1100   GFRAA >60 03/22/2016 1100    No results found for: SPEP, UPEP  Lab Results  Component Value Date   WBC 2.8 (L) 03/22/2016   NEUTROABS 1.6 (L) 03/22/2016   HGB 14.7 03/22/2016   HCT 43.0 03/22/2016   MCV 94.7 03/22/2016   PLT 157 03/22/2016      Chemistry      Component Value Date/Time   NA 136 03/22/2016 1100   NA 140 11/18/2013 0756   K 3.9 03/22/2016 1100   K 4.0 11/18/2013 0756   CL 109 03/22/2016 1100   CL 112 (H) 08/27/2012 1309   CO2 22 03/22/2016 1100   CO2 20 (L) 11/18/2013 0756   BUN 20 03/22/2016 1100   BUN 18.0 11/18/2013 0756   CREATININE 1.02 03/22/2016 1100  CREATININE 1.3 11/18/2013 0756      Component Value Date/Time   CALCIUM 7.7 (L) 03/22/2016 1100   CALCIUM 8.7 11/18/2013 0756   ALKPHOS 110 02/17/2016 1517   ALKPHOS 83 11/18/2013 0756   AST 24 02/17/2016 1517   AST 23 11/18/2013 0756   ALT 31 02/17/2016 1517   ALT 33 11/18/2013 0756   BILITOT 0.4 02/17/2016 1517   BILITOT 0.59 11/18/2013 0756       ASSESSMENT & PLAN:  Multiple myeloma in remission [facility])  According to the patient, he had labs drawn in [place] which showed complete remission He gets Revlimid through his oncologist in [place] would be to continue Revlimid indefinitely and Zometa every 3 months. The patient has discussed with his other oncologist about potential stopping treatment. I would defer to his other oncologist decision to stop treatment. We discussed the incurable nature of multiple myeloma. He has recent dental clearance with no concerns for osteonecrosis of the jaw. He will continue calcium with vitamin D supplements.Due to significant bone pain, I recommend we stop Zometa today.  I recommend consideration of switching  Zometa to twice a year as preventive dose only  Bone pain  He has intermittent, chronic pain in his back and joints. Recommend conservative management for now. He is on high-dose vitamin D and calcium. He felt intermittent flare of bone pain after each dose of Zometa. I would not proceed with Zometa and recommend him to discuss further with his oncologist in [place].  Lump of skin He complained of new lump near his hip joint. On examination, he felt like a lipoma. He desired to have it checked by a surgeon. He had hernia repair by Dr. Harlow Asa in the past. I will put a referral to Dr. Harlow Asa to evaluate this  Carpal tunnel syndrome on both sides He has had some intermittent numbness affecting the median nerve distribution on both hands, compatible with carpal tunnel syndrome. The patient is to work full-time. I recommend consideration of cutting back on his work hours as I suspect the carpal tunnel syndrome is related to his work. In the meantime, continue conservative management   Orders Placed This Encounter  Procedures  . Ambulatory referral to General Surgery    Referral Priority:   Routine    Referral Type:   Surgical    Referral Reason:   Specialty Services Required    Requested Specialty:   General Surgery    Number of Visits Requested:   1   All questions were answered. The patient knows to call the clinic with any problems, questions or concerns. No barriers to learning was detected. I spent 20 minutes counseling the patient face to face. The total time spent in the appointment was 30 minutes and more than 50% was on counseling and review of test results     Heath Lark, MD 04/24/2016 1:59 PM

## 2016-04-26 ENCOUNTER — Ambulatory Visit: Payer: Medicare Other | Admitting: Hematology and Oncology

## 2016-04-26 ENCOUNTER — Ambulatory Visit: Payer: Medicare Other

## 2016-04-30 ENCOUNTER — Telehealth: Payer: Self-pay | Admitting: *Deleted

## 2016-04-30 NOTE — Telephone Encounter (Signed)
Faxed office notes and called referral to Dr Harlow Asa fpor surgery referral

## 2016-05-04 ENCOUNTER — Ambulatory Visit (INDEPENDENT_AMBULATORY_CARE_PROVIDER_SITE_OTHER): Payer: Medicare Other | Admitting: Internal Medicine

## 2016-05-04 ENCOUNTER — Encounter: Payer: Self-pay | Admitting: Internal Medicine

## 2016-05-04 ENCOUNTER — Ambulatory Visit (INDEPENDENT_AMBULATORY_CARE_PROVIDER_SITE_OTHER): Payer: Medicare Other | Admitting: General Practice

## 2016-05-04 VITALS — BP 120/78 | HR 86 | Temp 97.6°F | Ht 71.0 in | Wt 201.0 lb

## 2016-05-04 DIAGNOSIS — G8929 Other chronic pain: Secondary | ICD-10-CM | POA: Diagnosis not present

## 2016-05-04 DIAGNOSIS — Z5181 Encounter for therapeutic drug level monitoring: Secondary | ICD-10-CM | POA: Diagnosis not present

## 2016-05-04 DIAGNOSIS — M549 Dorsalgia, unspecified: Secondary | ICD-10-CM

## 2016-05-04 DIAGNOSIS — L989 Disorder of the skin and subcutaneous tissue, unspecified: Secondary | ICD-10-CM

## 2016-05-04 DIAGNOSIS — Z8546 Personal history of malignant neoplasm of prostate: Secondary | ICD-10-CM

## 2016-05-04 LAB — POCT INR: INR: 3.2

## 2016-05-04 NOTE — Patient Instructions (Signed)
Pre visit review using our clinic review tool, if applicable. No additional management support is needed unless otherwise documented below in the visit note. 

## 2016-05-04 NOTE — Progress Notes (Signed)
I have reviewed and agree with the plan. 

## 2016-05-04 NOTE — Patient Instructions (Signed)
Please continue all other medications as before, and refills have been done if requested.  Please have the pharmacy call with any other refills you may need.  Please continue your efforts at being more active, low cholesterol diet, and weight control.  Please keep your appointments with your specialists as you may have planned  You will be contacted regarding the referral for: Urology, and Dermatology  Please return in 6 months, or sooner if needed

## 2016-05-04 NOTE — Progress Notes (Signed)
Subjective:    Patient ID: Rodney [name], male    DOB: [birth year], 76 y.o.   MRN: [id number]  HPI  .Here to f/u, has noticed several scaly skin lesions to arms in last few wks, and asks for derm referral;.  Denies urinary symptoms such as dysuria, frequency, urgency, flank pain, hematuria or n/v, fever, chills, but asks for urology referral for hx of prostate ca, last urologist has retired.  Pt continues to have recurring LBP without change in severity, bowel or bladder change, fever, wt loss,  worsening LE pain/numbness/weakness, gait change or falls. Pt denies chest pain, increased sob or doe, wheezing, orthopnea, PND, increased LE swelling, palpitations, dizziness or syncope. Past Medical History:  Diagnosis Date  . Anemia 02-16-11   01-05-11- post surgery-Transfusions x 4 units  . Bone pain 02/18/2013  . Complication of anesthesia 02-16-11   Pt. speaks of awakening during the surgery from back  surgery  . Degenerative disc disease 02-16-11   01-05-11 L3 fusion done  . Esophageal reflux 05/31/2008   Qualifier: Diagnosis of  By: Arnoldo Morale MD, Balinda Quails   . Hernia 02-16-11   left inguinal hernia at present  . Hypothyroidism 02/01/2014  . Multiple myeloma 02-16-11   suspected tumor  left sacral  . Pancreatitis   . Personal history of traumatic fracture 07/10/2012  . Prostate cancer ([place])   . Pulmonary embolism ([place])   . Second degree atrioventricular block 11/26/2012  . Shortness of breath 02-16-11   hx. Pulmonary emboli x2 (9 yrs/ 3'12 -last)   Past Surgical History:  Procedure Laterality Date  . BACK SURGERY  02-16-11    01-05-11 Lumbar surgery L3(complicated by loss of blood volume)/ 01-09-11 then Lumbar fusion done with retained  hardware   . CHOLECYSTECTOMY  04/06/2010   laparoscopic-inflammation with stones  . HERNIA REPAIR  02/20/11   Inguinal hernia repair w/mesh  . INGUINAL HERNIA REPAIR  02/20/2011   Procedure: HERNIA REPAIR INGUINAL ADULT;  Surgeon: Earnstine Regal, MD;   Location: [facility];  Service: General;  Laterality: Left;  Repair Left Inguinal Hernia with Mesh  . PROSTATE SURGERY Bilateral 2011    reports that he quit smoking about 47 years ago. He has never used smokeless tobacco. He reports that he does not drink alcohol or use drugs. family history includes Cancer in his brother and father; Clotting disorder in his mother; Lymphoma (age of onset: 63) in his father. Allergies  Allergen Reactions  . Blood-Group Specific Substance     Must be Leukocyte Reduced and Irradiated due to stem cell transplant.    Current Outpatient Prescriptions on File Prior to Visit  Medication Sig Dispense Refill  . bacitracin ointment Apply 1 application topically 2 (two) times daily. To the left great toe for 7 days 120 g 0  . calcium carbonate (TUMS - DOSED IN MG ELEMENTAL CALCIUM) 500 MG chewable tablet Chew 1 tablet by mouth every 2 (two) hours as needed. HEART BURN      . Cholecalciferol (VITAMIN D) 1000 UNITS capsule Take 4,000 Units by mouth daily.     . cyclobenzaprine (FLEXERIL) 10 MG tablet Take 1 tablet (10 mg total) by mouth 3 (three) times daily as needed for muscle spasms. 30 tablet 3  . DULoxetine (CYMBALTA) 60 MG capsule Take 1 capsule (60 mg total) by mouth daily. 90 capsule 3  . gabapentin (NEURONTIN) 300 MG capsule Take 300 mg by mouth daily as needed. Nerve pain    . HYDROcodone-acetaminophen (NORCO/VICODIN) 5-325  MG tablet Take 1-2 tablets by mouth every 6 (six) hours as needed for moderate pain.    Marland Kitchen levothyroxine (SYNTHROID, LEVOTHROID) 100 MCG tablet Take 1 tablet (100 mcg total) by mouth daily. 90 tablet 3  . omeprazole (PRILOSEC) 20 MG capsule Take 20 mg by mouth 2 (two) times daily before a meal. HEART BURN     . ondansetron (ZOFRAN ODT) 4 MG disintegrating tablet Take 1 tablet (4 mg total) by mouth every 8 (eight) hours as needed for nausea or vomiting. 10 tablet 0  . ondansetron (ZOFRAN) 4 MG tablet Take 1 tablet (4 mg total) by mouth every 8  (eight) hours as needed for nausea or vomiting. 20 tablet 0  . POTASSIUM PO Take 1 tablet by mouth daily.    Marland Kitchen REVLIMID 10 MG capsule Take 10 mg by mouth daily. 21 days on and 7 days off    . warfarin (COUMADIN) 5 MG tablet TAKE BY MOUTH AS DIRECTED BY ANTICOAGULATION CLINIC (Patient taking differently: Take 5 mg by mouth daily at 6 PM. TAKE BY MOUTH AS DIRECTED BY ANTICOAGULATION CLINIC) 30 tablet 3   No current facility-administered medications on file prior to visit.    Review of Systems  Constitutional: Negative for unusual diaphoresis or night sweats HENT: Negative for ear swelling or discharge Eyes: Negative for worsening visual haziness  Respiratory: Negative for choking and stridor.   Gastrointestinal: Negative for distension or worsening eructation Genitourinary: Negative for retention or change in urine volume.  Musculoskeletal: Negative for other MSK pain or swelling Skin: Negative for color change and worsening wound Neurological: Negative for tremors and numbness other than noted  Psychiatric/Behavioral: Negative for decreased concentration or agitation other than above   All other system neg per pt    Objective:   Physical Exam BP 120/78   Pulse 86   Temp 97.6 F (36.4 C)   Ht _0  (1.803 m)   Wt 201 lb (91.2 kg)   SpO2 99%   BMI 28.03 kg/m  VS noted,  Constitutional: Pt appears in no apparent distress HENT: Head: NCAT.  Right Ear: External ear normal.  Left Ear: External ear normal.  Eyes: . Pupils are equal, round, and reactive to light. Conjunctivae and EOM are normal Neck: Normal range of motion. Neck supple.  Cardiovascular: Normal rate and regular rhythm.   Pulmonary/Chest: Effort normal and breath sounds without rales or wheezing.  Spine:  nontender spine throughout Neurological: Pt is alert. Not confused , motor grossly intact Skin: Skin is warm. No rash, no LE edema, has several keratotic appearing < 1/2 cm slight raised tan NT lesions to  arms Psychiatric: Pt behavior is normal. No agitation.  No other exam findings    Assessment & Plan:

## 2016-05-05 NOTE — Assessment & Plan Note (Signed)
New onset per pt, for derm referral per pt request

## 2016-05-05 NOTE — Assessment & Plan Note (Signed)
Stable, mild,  to f/u any worsening symptoms or concerns

## 2016-05-05 NOTE — Assessment & Plan Note (Signed)
Asympt, due for f/u urology referral,  to f/u any worsening symptoms or concerns, declines psa today Lab Results  Component Value Date   PSA 0.00 Repeated and verified X2. (L) 02/17/2016   PSA <0.01 05/18/2013   PSA (L) 07/26/2010    <0.01 (NOTE) Result repeated and verified. Test Methodology: [employer] PSA

## 2016-05-10 ENCOUNTER — Other Ambulatory Visit: Payer: Self-pay | Admitting: Family Medicine

## 2016-05-31 ENCOUNTER — Ambulatory Visit (INDEPENDENT_AMBULATORY_CARE_PROVIDER_SITE_OTHER): Payer: Medicare Other | Admitting: Podiatry

## 2016-05-31 ENCOUNTER — Encounter: Payer: Self-pay | Admitting: Podiatry

## 2016-05-31 ENCOUNTER — Other Ambulatory Visit: Payer: Self-pay | Admitting: Family Medicine

## 2016-05-31 DIAGNOSIS — M216X9 Other acquired deformities of unspecified foot: Secondary | ICD-10-CM | POA: Diagnosis not present

## 2016-05-31 DIAGNOSIS — M779 Enthesopathy, unspecified: Secondary | ICD-10-CM

## 2016-06-01 NOTE — Progress Notes (Signed)
Subjective:     Patient ID: Rodney [name], male   DOB: 1940/11/04, 76 y.o.   MRN: [id number]  HPI patient states he has developed pain underneath his big toe joint right and he does not remember specific injury. States his nail is doing fine   Review of Systems     Objective:   Physical Exam Neurovascular status intact with irritation of the sub-first metatarsal head right with moderate plantar flexion of the metatarsal itself    Assessment:     Probable trauma to the first metatarsal right secondary to structural position with diminished fat pad noted    Plan:     Reviewed conditions and at this time I applied a low dye trapping with dancer's pad and dispensed several branches pads to him. He will not go barefoot and if this gives him trouble he will let us know

## 2016-06-04 ENCOUNTER — Telehealth: Payer: Self-pay | Admitting: Emergency Medicine

## 2016-06-04 ENCOUNTER — Other Ambulatory Visit: Payer: Self-pay | Admitting: General Practice

## 2016-06-04 ENCOUNTER — Ambulatory Visit (INDEPENDENT_AMBULATORY_CARE_PROVIDER_SITE_OTHER): Payer: Medicare Other | Admitting: General Practice

## 2016-06-04 DIAGNOSIS — Z5181 Encounter for therapeutic drug level monitoring: Secondary | ICD-10-CM

## 2016-06-04 LAB — POCT INR: INR: 3.4

## 2016-06-04 MED ORDER — WARFARIN SODIUM 5 MG PO TABS: ORAL_TABLET | ORAL | 3 refills | Status: DC

## 2016-06-04 NOTE — Patient Instructions (Signed)
Pre visit review using our clinic review tool, if applicable. No additional management support is needed unless otherwise documented below in the visit note. 

## 2016-06-04 NOTE — Telephone Encounter (Signed)
I looked in his chart and didn't see any telephone encounters recently besides the one you opened today. However I looked in his mediations and it seems like Cindy sent it to the pharmacy about 15-20 minutes ago. I'm not sure where the ball was dropped concerning this medication.

## 2016-06-04 NOTE — Telephone Encounter (Signed)
Pt came in requesting refill on his Warfarin (Coumadin) 5 MG Tablet. Stated he has been trying to get this refilled for a while. When looking back at notes it looks like the request was being sent to previous MD. Pt got upset and walked out stating he would just go see Jenny Reichmann at [place] before I was able to look at messages and explain what was going on. Please follow up thanks.

## 2016-06-04 NOTE — Progress Notes (Signed)
I agree with this plan.

## 2016-06-05 ENCOUNTER — Telehealth: Payer: Self-pay | Admitting: Internal Medicine

## 2016-06-06 ENCOUNTER — Ambulatory Visit (INDEPENDENT_AMBULATORY_CARE_PROVIDER_SITE_OTHER): Payer: Medicare Other | Admitting: Internal Medicine

## 2016-06-06 ENCOUNTER — Encounter: Payer: Self-pay | Admitting: Internal Medicine

## 2016-06-06 DIAGNOSIS — R05 Cough: Secondary | ICD-10-CM | POA: Diagnosis not present

## 2016-06-06 DIAGNOSIS — R059 Cough, unspecified: Secondary | ICD-10-CM | POA: Insufficient documentation

## 2016-06-06 MED ORDER — AZITHROMYCIN 250 MG PO TABS: ORAL_TABLET | ORAL | 1 refills | Status: DC

## 2016-06-06 MED ORDER — HYDROCODONE-HOMATROPINE 5-1.5 MG/5ML PO SYRP: 5.0000 mL | ORAL_SOLUTION | Freq: Four times a day (QID) | ORAL | 0 refills | Status: AC | PRN

## 2016-06-06 NOTE — Progress Notes (Signed)
Pre visit review using our clinic review tool, if applicable. No additional management support is needed unless otherwise documented below in the visit note. 

## 2016-06-06 NOTE — Patient Instructions (Signed)
Please take all new medication as prescribed - the antibiotic, and cough medicine if needed  Please call or return if you are any worse, for Chest xray  Please continue all other medications as before, and refills have been done if requested.  Please have the pharmacy call with any other refills you may need.  Please keep your appointments with your specialists as you may have planned

## 2016-06-06 NOTE — Progress Notes (Signed)
Subjective:    Patient ID: Rodney [name], male    DOB: [birth year], 76 y.o.   MRN: [id number]  HPI Here with acute onset mild to mod 7 days ST, HA, general weakness and malaise, with prod cough greenish sputum, but Pt denies chest pain, increased sob or doe, wheezing, orthopnea, PND, increased LE swelling, palpitations, dizziness or syncope. Overall just not getting better.  Past Medical History:  Diagnosis Date  . Anemia 02-16-11   01-05-11- post surgery-Transfusions x 4 units  . Bone pain 02/18/2013  . Complication of anesthesia 02-16-11   Pt. speaks of awakening during the surgery from back  surgery  . Degenerative disc disease 02-16-11   01-05-11 L3 fusion done  . Esophageal reflux 05/31/2008   Qualifier: Diagnosis of  By: Arnoldo Morale MD, Balinda Quails   . Hernia 02-16-11   left inguinal hernia at present  . Hypothyroidism 02/01/2014  . Multiple myeloma 02-16-11   suspected tumor  left sacral  . Pancreatitis   . Personal history of traumatic fracture 07/10/2012  . Prostate cancer ([place])   . Pulmonary embolism ([place])   . Second degree atrioventricular block 11/26/2012  . Shortness of breath 02-16-11   hx. Pulmonary emboli x2 (9 yrs/ 3'12 -last)   Past Surgical History:  Procedure Laterality Date  . BACK SURGERY  02-16-11    01-05-11 Lumbar surgery L3(complicated by loss of blood volume)/ 01-09-11 then Lumbar fusion done with retained  hardware   . CHOLECYSTECTOMY  04/06/2010   laparoscopic-inflammation with stones  . HERNIA REPAIR  02/20/11   Inguinal hernia repair w/mesh  . INGUINAL HERNIA REPAIR  02/20/2011   Procedure: HERNIA REPAIR INGUINAL ADULT;  Surgeon: Earnstine Regal, MD;  Location: [facility];  Service: General;  Laterality: Left;  Repair Left Inguinal Hernia with Mesh  . PROSTATE SURGERY Bilateral 2011    reports that he quit smoking about 47 years ago. He has never used smokeless tobacco. He reports that he does not drink alcohol or use drugs. family history includes Cancer in his  brother and father; Clotting disorder in his mother; Lymphoma (age of onset: 25) in his father. Allergies  Allergen Reactions  . Blood-Group Specific Substance     Must be Leukocyte Reduced and Irradiated due to stem cell transplant.    Current Outpatient Prescriptions on File Prior to Visit  Medication Sig Dispense Refill  . bacitracin ointment Apply 1 application topically 2 (two) times daily. To the left great toe for 7 days 120 g 0  . calcium carbonate (TUMS - DOSED IN MG ELEMENTAL CALCIUM) 500 MG chewable tablet Chew 1 tablet by mouth every 2 (two) hours as needed. HEART BURN      . Cholecalciferol (VITAMIN D) 1000 UNITS capsule Take 4,000 Units by mouth daily.     . cyclobenzaprine (FLEXERIL) 10 MG tablet Take 1 tablet (10 mg total) by mouth 3 (three) times daily as needed for muscle spasms. 30 tablet 3  . DULoxetine (CYMBALTA) 60 MG capsule Take 1 capsule (60 mg total) by mouth daily. 90 capsule 3  . gabapentin (NEURONTIN) 300 MG capsule Take 300 mg by mouth daily as needed. Nerve pain    . HYDROcodone-acetaminophen (NORCO/VICODIN) 5-325 MG tablet Take 1-2 tablets by mouth every 6 (six) hours as needed for moderate pain.    Marland Kitchen levothyroxine (SYNTHROID, LEVOTHROID) 100 MCG tablet Take 1 tablet (100 mcg total) by mouth daily. 90 tablet 3  . omeprazole (PRILOSEC) 20 MG capsule Take 20 mg by mouth  2 (two) times daily before a meal. HEART BURN     . ondansetron (ZOFRAN ODT) 4 MG disintegrating tablet Take 1 tablet (4 mg total) by mouth every 8 (eight) hours as needed for nausea or vomiting. 10 tablet 0  . ondansetron (ZOFRAN) 4 MG tablet Take 1 tablet (4 mg total) by mouth every 8 (eight) hours as needed for nausea or vomiting. 20 tablet 0  . POTASSIUM PO Take 1 tablet by mouth daily.    Marland Kitchen REVLIMID 10 MG capsule Take 10 mg by mouth daily. 21 days on and 7 days off    . warfarin (COUMADIN) 5 MG tablet TAKE BY MOUTH AS DIRECTED BY ANTICOAGULATION CLINIC 30 tablet 3   No current  facility-administered medications on file prior to visit.    Review of Systems All otherwise neg per pt     Objective:   Physical Exam BP 120/78   Pulse 87   Temp 98.3 F (36.8 C) (Oral)   Ht _0  (1.803 m)   Wt 202 lb (91.6 kg)   SpO2 98%   BMI 28.17 kg/m  VS noted,  Constitutional: Pt appears in NAD HENT: Head: NCAT.  Right Ear: External ear normal.  Left Ear: External ear normal.  Eyes: . Pupils are equal, round, and reactive to light. Conjunctivae and EOM are normal Nose: without d/c or deformity Bilat tm's with mild erythema.  Max sinus areas non tender.  Pharynx with mild erythema, no exudate Neck: Neck supple. Gross normal ROM Cardiovascular: Normal rate and regular rhythm.   Pulmonary/Chest: Effort normal and breath sounds without rales or wheezing.  Neurological: Pt is alert. At baseline orientation, motor grossly intact Skin: Skin is warm. No rashes, other new lesions, no LE edema Psychiatric: Pt behavior is normal without agitation  No other exam findings    Assessment & Plan:

## 2016-06-07 ENCOUNTER — Ambulatory Visit: Payer: Medicare Other | Admitting: Podiatry

## 2016-06-07 ENCOUNTER — Telehealth: Payer: Self-pay | Admitting: Internal Medicine

## 2016-06-07 NOTE — Telephone Encounter (Signed)
Pt called checking on this prescription. It sounds like he spoke with someone that was telling him he might have to pick it up? He did not know what the prescription was but it was something that needed to be changed because he was on warfarin. I am assuming it was the antibiotic? He just wants to make sure that this is sent over to the [facility]. Please advise.

## 2016-06-07 NOTE — Telephone Encounter (Signed)
Patient called in stating there was an interaction between an antibiotic and warfarin.  Patient requesting a call go to his pharmacy in regard.

## 2016-06-07 NOTE — Telephone Encounter (Signed)
The pharmacy was notified this was ok to take the antibx and the coumadin  No further med changes are needed as of the time after his last OV

## 2016-06-08 ENCOUNTER — Ambulatory Visit: Payer: Medicare Other

## 2016-06-10 NOTE — Assessment & Plan Note (Signed)
c/w bronchitis vs pna, declines cxr, Mild to mod, for antibx course,  to f/u any worsening symptoms or concerns

## 2016-07-05 ENCOUNTER — Other Ambulatory Visit: Payer: Self-pay | Admitting: Internal Medicine

## 2016-07-05 DIAGNOSIS — M545 Low back pain, unspecified: Secondary | ICD-10-CM

## 2016-07-05 DIAGNOSIS — C9000 Multiple myeloma not having achieved remission: Secondary | ICD-10-CM

## 2016-07-06 ENCOUNTER — Ambulatory Visit: Payer: Medicare Other

## 2016-07-09 ENCOUNTER — Ambulatory Visit (INDEPENDENT_AMBULATORY_CARE_PROVIDER_SITE_OTHER): Payer: Medicare Other | Admitting: General Practice

## 2016-07-09 DIAGNOSIS — Z5181 Encounter for therapeutic drug level monitoring: Secondary | ICD-10-CM | POA: Diagnosis not present

## 2016-07-09 DIAGNOSIS — I2699 Other pulmonary embolism without acute cor pulmonale: Secondary | ICD-10-CM

## 2016-07-09 LAB — POCT INR: INR: 2.3

## 2016-07-09 NOTE — Progress Notes (Signed)
I agree with this plan.

## 2016-07-09 NOTE — Patient Instructions (Signed)
Pre visit review using our clinic review tool, if applicable. No additional management support is needed unless otherwise documented below in the visit note. 

## 2016-07-16 DIAGNOSIS — R6889 Other general symptoms and signs: Secondary | ICD-10-CM | POA: Diagnosis not present

## 2016-07-16 DIAGNOSIS — C9000 Multiple myeloma not having achieved remission: Secondary | ICD-10-CM | POA: Diagnosis not present

## 2016-07-17 DIAGNOSIS — R3915 Urgency of urination: Secondary | ICD-10-CM | POA: Diagnosis not present

## 2016-07-17 DIAGNOSIS — C61 Malignant neoplasm of prostate: Secondary | ICD-10-CM | POA: Diagnosis not present

## 2016-07-25 ENCOUNTER — Telehealth: Payer: Self-pay

## 2016-07-25 NOTE — Telephone Encounter (Signed)
Called pt, mailbox was full, handicap placard paperwork is filled out and ready for pick up. At front desk.

## 2016-08-17 ENCOUNTER — Ambulatory Visit (INDEPENDENT_AMBULATORY_CARE_PROVIDER_SITE_OTHER): Payer: Medicare Other | Admitting: General Practice

## 2016-08-17 DIAGNOSIS — Z5181 Encounter for therapeutic drug level monitoring: Secondary | ICD-10-CM | POA: Diagnosis not present

## 2016-08-17 LAB — POCT INR: INR: 1.9

## 2016-08-17 NOTE — Patient Instructions (Signed)
Pre visit review using our clinic review tool, if applicable. No additional management support is needed unless otherwise documented below in the visit note. 

## 2016-08-28 DIAGNOSIS — R3915 Urgency of urination: Secondary | ICD-10-CM | POA: Diagnosis not present

## 2016-08-28 DIAGNOSIS — N3281 Overactive bladder: Secondary | ICD-10-CM | POA: Diagnosis not present

## 2016-09-06 DIAGNOSIS — L814 Other melanin hyperpigmentation: Secondary | ICD-10-CM | POA: Diagnosis not present

## 2016-09-06 DIAGNOSIS — D1801 Hemangioma of skin and subcutaneous tissue: Secondary | ICD-10-CM | POA: Diagnosis not present

## 2016-09-06 DIAGNOSIS — C44319 Basal cell carcinoma of skin of other parts of face: Secondary | ICD-10-CM | POA: Diagnosis not present

## 2016-09-06 DIAGNOSIS — L570 Actinic keratosis: Secondary | ICD-10-CM | POA: Diagnosis not present

## 2016-09-06 DIAGNOSIS — L821 Other seborrheic keratosis: Secondary | ICD-10-CM | POA: Diagnosis not present

## 2016-09-06 DIAGNOSIS — D485 Neoplasm of uncertain behavior of skin: Secondary | ICD-10-CM | POA: Diagnosis not present

## 2016-09-21 ENCOUNTER — Ambulatory Visit (INDEPENDENT_AMBULATORY_CARE_PROVIDER_SITE_OTHER): Payer: Medicare Other | Admitting: General Practice

## 2016-09-21 DIAGNOSIS — Z5181 Encounter for therapeutic drug level monitoring: Secondary | ICD-10-CM

## 2016-09-24 ENCOUNTER — Ambulatory Visit (INDEPENDENT_AMBULATORY_CARE_PROVIDER_SITE_OTHER)
Admission: RE | Admit: 2016-09-24 | Discharge: 2016-09-24 | Disposition: A | Discharge status: Home / Self Care | Payer: Medicare Other | Source: Ambulatory Visit | Attending: Internal Medicine | Admitting: Internal Medicine

## 2016-09-24 ENCOUNTER — Other Ambulatory Visit (INDEPENDENT_AMBULATORY_CARE_PROVIDER_SITE_OTHER): Payer: Medicare Other

## 2016-09-24 ENCOUNTER — Ambulatory Visit (INDEPENDENT_AMBULATORY_CARE_PROVIDER_SITE_OTHER): Payer: Medicare Other | Admitting: General Practice

## 2016-09-24 ENCOUNTER — Ambulatory Visit (INDEPENDENT_AMBULATORY_CARE_PROVIDER_SITE_OTHER): Payer: Medicare Other | Admitting: Internal Medicine

## 2016-09-24 ENCOUNTER — Encounter: Payer: Self-pay | Admitting: Internal Medicine

## 2016-09-24 VITALS — BP 124/82 | HR 58 | Temp 97.7°F | Ht 71.0 in | Wt 200.0 lb

## 2016-09-24 DIAGNOSIS — C9001 Multiple myeloma in remission: Secondary | ICD-10-CM | POA: Diagnosis not present

## 2016-09-24 DIAGNOSIS — Z5181 Encounter for therapeutic drug level monitoring: Secondary | ICD-10-CM

## 2016-09-24 DIAGNOSIS — E559 Vitamin D deficiency, unspecified: Secondary | ICD-10-CM | POA: Diagnosis not present

## 2016-09-24 DIAGNOSIS — R0602 Shortness of breath: Secondary | ICD-10-CM

## 2016-09-24 DIAGNOSIS — R5383 Other fatigue: Secondary | ICD-10-CM | POA: Diagnosis not present

## 2016-09-24 DIAGNOSIS — C61 Malignant neoplasm of prostate: Secondary | ICD-10-CM | POA: Diagnosis not present

## 2016-09-24 DIAGNOSIS — R202 Paresthesia of skin: Secondary | ICD-10-CM

## 2016-09-24 DIAGNOSIS — E039 Hypothyroidism, unspecified: Secondary | ICD-10-CM

## 2016-09-24 LAB — BASIC METABOLIC PANEL
BUN: 15 mg/dL (ref 6–23)
CALCIUM: 8.7 mg/dL (ref 8.4–10.5)
CO2: 26 mEq/L (ref 19–32)
Chloride: 105 mEq/L (ref 96–112)
Creatinine, Ser: 1.09 mg/dL (ref 0.40–1.50)
GFR: 70.28 mL/min (ref >60.00)
Glucose, Bld: 93 mg/dL (ref 70–99)
POTASSIUM: 3.8 meq/L (ref 3.5–5.1)
SODIUM: 138 meq/L (ref 135–145)

## 2016-09-24 LAB — VITAMIN B12: Vitamin B-12: 293 pg/mL (ref 211–911)

## 2016-09-24 LAB — HEPATIC FUNCTION PANEL
ALBUMIN: 4.1 g/dL (ref 3.5–5.2)
ALK PHOS: 86 U/L (ref 39–117)
ALT: 24 U/L (ref 0–53)
AST: 21 U/L (ref 0–37)
BILIRUBIN DIRECT: 0.1 mg/dL (ref 0.0–0.3)
BILIRUBIN TOTAL: 0.5 mg/dL (ref 0.2–1.2)
Total Protein: 6.7 g/dL (ref 6.0–8.3)

## 2016-09-24 LAB — CBC WITH DIFFERENTIAL/PLATELET
BASOS PCT: 3.3 % — AB (ref 0.0–3.0)
Basophils Absolute: 0.2 10*3/uL — ABNORMAL HIGH (ref 0.0–0.1)
EOS PCT: 3 % (ref 0.0–5.0)
Eosinophils Absolute: 0.2 10*3/uL (ref 0.0–0.7)
HCT: 43 % (ref 39.0–52.0)
Hemoglobin: 14.5 g/dL (ref 13.0–17.0)
LYMPHS ABS: 1.1 10*3/uL (ref 0.7–4.0)
Lymphocytes Relative: 20.2 % (ref 12.0–46.0)
MCHC: 33.7 g/dL (ref 30.0–36.0)
MCV: 98.3 fl (ref 78.0–100.0)
MONO ABS: 1 10*3/uL (ref 0.1–1.0)
Monocytes Relative: 17.2 % — ABNORMAL HIGH (ref 3.0–12.0)
NEUTROS PCT: 56.3 % (ref 43.0–77.0)
Neutro Abs: 3.1 10*3/uL (ref 1.4–7.7)
PLATELETS: 190 10*3/uL (ref 150.0–400.0)
RBC: 4.38 Mil/uL (ref 4.22–5.81)
RDW: 15.3 % (ref 11.5–15.5)
WBC: 5.6 10*3/uL (ref 4.0–10.5)

## 2016-09-24 LAB — URINALYSIS, ROUTINE W REFLEX MICROSCOPIC
BILIRUBIN URINE: NEGATIVE
KETONES UR: NEGATIVE
LEUKOCYTES UA: NEGATIVE
NITRITE: NEGATIVE
Specific Gravity, Urine: 1.02 (ref 1.000–1.030)
Total Protein, Urine: 30 — AB
URINE GLUCOSE: NEGATIVE
Urobilinogen, UA: 0.2 (ref 0.0–1.0)
pH: 5.5 (ref 5.0–8.0)

## 2016-09-24 LAB — PSA: PSA: 0 ng/mL — ABNORMAL LOW (ref 0.10–4.00)

## 2016-09-24 LAB — TSH: TSH: 3.25 u[IU]/mL (ref 0.35–4.50)

## 2016-09-24 LAB — CORTISOL: Cortisol, Plasma: 8.3 ug/dL

## 2016-09-24 LAB — VITAMIN D 25 HYDROXY (VIT D DEFICIENCY, FRACTURES): VITD: 50.9 ng/mL (ref 30.00–100.00)

## 2016-09-24 LAB — SEDIMENTATION RATE: SED RATE: 8 mm/h (ref 0–20)

## 2016-09-24 LAB — POCT INR: INR: 1.9

## 2016-09-24 LAB — CK: Total CK: 219 U/L (ref 7–232)

## 2016-09-24 NOTE — Progress Notes (Signed)
I agree with this plan.

## 2016-09-24 NOTE — Assessment & Plan Note (Signed)
Hem OV q 3 mo in [facility]

## 2016-09-24 NOTE — Assessment & Plan Note (Addendum)
x 1 mo ?etiology Labs EKG CXR Off work till next appt

## 2016-09-24 NOTE — Progress Notes (Signed)
Subjective:  Patient ID: Rodney [name], male    DOB: 03/17/1940  Age: 76 y.o. MRN: [id number]  CC: No chief complaint on file.   HPI Rodney [name] presents for weakness and fatigue x 1 month - physical work at [employer] He is "spent" after a 1/2 of his shift... No CP, no SOB. H/o MM. C/o LBP  Outpatient Medications Prior to Visit  Medication Sig Dispense Refill  . azithromycin (ZITHROMAX Z-PAK) 250 MG tablet 2 tab by mouth day 1, then 1 per day 6 tablet 1  . bacitracin ointment Apply 1 application topically 2 (two) times daily. To the left great toe for 7 days 120 g 0  . calcium carbonate (TUMS - DOSED IN MG ELEMENTAL CALCIUM) 500 MG chewable tablet Chew 1 tablet by mouth every 2 (two) hours as needed. HEART BURN      . Cholecalciferol (VITAMIN D) 1000 UNITS capsule Take 4,000 Units by mouth daily.     . cyclobenzaprine (FLEXERIL) 10 MG tablet TAKE ONE TABLET BY MOUTH THREE TIMES DAILY AS NEEDED FOR MUSCLE SPASMS 30 tablet 3  . DULoxetine (CYMBALTA) 60 MG capsule Take 1 capsule (60 mg total) by mouth daily. 90 capsule 3  . gabapentin (NEURONTIN) 300 MG capsule Take 300 mg by mouth daily as needed. Nerve pain    . HYDROcodone-acetaminophen (NORCO/VICODIN) 5-325 MG tablet Take 1-2 tablets by mouth every 6 (six) hours as needed for moderate pain.    Marland Kitchen levothyroxine (SYNTHROID, LEVOTHROID) 100 MCG tablet Take 1 tablet (100 mcg total) by mouth daily. 90 tablet 3  . omeprazole (PRILOSEC) 20 MG capsule Take 20 mg by mouth 2 (two) times daily before a meal. HEART BURN     . ondansetron (ZOFRAN ODT) 4 MG disintegrating tablet Take 1 tablet (4 mg total) by mouth every 8 (eight) hours as needed for nausea or vomiting. 10 tablet 0  . ondansetron (ZOFRAN) 4 MG tablet Take 1 tablet (4 mg total) by mouth every 8 (eight) hours as needed for nausea or vomiting. 20 tablet 0  . POTASSIUM PO Take 1 tablet by mouth daily.    Marland Kitchen REVLIMID 10 MG capsule Take 10 mg by mouth daily. 21 days on and 7 days off      . warfarin (COUMADIN) 5 MG tablet TAKE BY MOUTH AS DIRECTED BY ANTICOAGULATION CLINIC 30 tablet 3   No facility-administered medications prior to visit.     ROS Review of Systems  Constitutional: Positive for fatigue. Negative for appetite change and unexpected weight change.  HENT: Negative for congestion, nosebleeds, sneezing, sore throat and trouble swallowing.   Eyes: Negative for itching and visual disturbance.  Respiratory: Positive for shortness of breath. Negative for cough, chest tightness and wheezing.   Cardiovascular: Negative for chest pain, palpitations and leg swelling.  Gastrointestinal: Negative for abdominal distention, blood in stool, diarrhea and nausea.  Genitourinary: Negative for frequency and hematuria.  Musculoskeletal: Positive for back pain and gait problem. Negative for joint swelling and neck pain.  Skin: Negative for rash.  Neurological: Negative for dizziness, tremors, speech difficulty and weakness.  Psychiatric/Behavioral: Negative for agitation, dysphoric mood and sleep disturbance. The patient is not nervous/anxious.     Objective:  BP 124/82 (BP Location: Left Arm, Patient Position: Sitting, Cuff Size: Normal)   Pulse (!) 58   Temp 97.7 F (36.5 C) (Oral)   Ht 5\' 11"  (1.803 m)   Wt 200 lb (90.7 kg)   SpO2 98%   BMI 27.89 kg/m  BP Readings from Last 3 Encounters:  09/24/16 124/82  06/06/16 120/78  05/04/16 120/78    Wt Readings from Last 3 Encounters:  09/24/16 200 lb (90.7 kg)  06/06/16 202 lb (91.6 kg)  05/04/16 201 lb (91.2 kg)    Physical Exam  Constitutional: He is oriented to person, place, and time. He appears well-developed. No distress.  NAD  HENT:  Mouth/Throat: Oropharynx is clear and moist.  Eyes: Pupils are equal, round, and reactive to light. Conjunctivae are normal.  Neck: Normal range of motion. No JVD present. No thyromegaly present.  Cardiovascular: Normal rate, regular rhythm, normal heart sounds and intact  distal pulses.  Exam reveals no gallop and no friction rub.   No murmur heard. Pulmonary/Chest: Effort normal and breath sounds normal. No respiratory distress. He has no wheezes. He has no rales. He exhibits no tenderness.  Abdominal: Soft. Bowel sounds are normal. He exhibits no distension and no mass. There is no tenderness. There is no rebound and no guarding.  Musculoskeletal: Normal range of motion. He exhibits tenderness. He exhibits no edema.  Lymphadenopathy:    He has no cervical adenopathy.  Neurological: He is alert and oriented to person, place, and time. He has normal reflexes. No cranial nerve deficit. He exhibits normal muscle tone. He displays a negative Romberg sign. Coordination and gait normal.  Skin: Skin is warm and dry. No rash noted.  Psychiatric: He has a normal mood and affect. His behavior is normal. Judgment and thought content normal.  LS tender  Procedure: EKG Indication: SOB, weakness Impression: NSR. RBBB   Lab Results  Component Value Date   WBC 2.8 (L) 03/22/2016   HGB 14.7 03/22/2016   HCT 43.0 03/22/2016   PLT 157 03/22/2016   GLUCOSE 98 03/22/2016   CHOL 109 02/17/2016   TRIG 153.0 (H) 02/17/2016   HDL 34.30 (L) 02/17/2016   LDLDIRECT 98.3 05/31/2008   LDLCALC 44 02/17/2016   ALT 31 02/17/2016   AST 24 02/17/2016   NA 136 03/22/2016   K 3.9 03/22/2016   CL 109 03/22/2016   CREATININE 1.02 03/22/2016   BUN 20 03/22/2016   CO2 22 03/22/2016   TSH 4.58 (H) 02/17/2016   PSA 0.00 Repeated and verified X2. (L) 02/17/2016   INR 1.9 09/24/2016    No results found.  Assessment & Plan:   There are no diagnoses linked to this encounter. I am having Mr. Inabinet maintain his omeprazole, calcium carbonate, REVLIMID, Vitamin D, ondansetron, POTASSIUM PO, levothyroxine, DULoxetine, gabapentin, HYDROcodone-acetaminophen, ondansetron, bacitracin, warfarin, azithromycin, cyclobenzaprine, and mirabegron ER.  Meds ordered this encounter  Medications    . mirabegron ER (MYRBETRIQ) 25 MG TB24 tablet    Sig: Take 25 mg by mouth daily.     Follow-up: No Follow-up on file.  Walker Kehr, MD

## 2016-09-24 NOTE — Patient Instructions (Signed)
Pre visit review using our clinic review tool, if applicable. No additional management support is needed unless otherwise documented below in the visit note. 

## 2016-09-24 NOTE — Patient Instructions (Signed)
Dr Jenny Reichmann in 1 week

## 2016-09-24 NOTE — Assessment & Plan Note (Signed)
TSH 

## 2016-10-02 ENCOUNTER — Encounter: Payer: Self-pay | Admitting: Internal Medicine

## 2016-10-02 ENCOUNTER — Ambulatory Visit (INDEPENDENT_AMBULATORY_CARE_PROVIDER_SITE_OTHER): Payer: Medicare Other | Admitting: Internal Medicine

## 2016-10-02 ENCOUNTER — Telehealth: Payer: Self-pay | Admitting: Internal Medicine

## 2016-10-02 VITALS — BP 120/76 | HR 60 | Ht 71.0 in | Wt 200.0 lb

## 2016-10-02 DIAGNOSIS — F329 Major depressive disorder, single episode, unspecified: Secondary | ICD-10-CM | POA: Diagnosis not present

## 2016-10-02 DIAGNOSIS — F3342 Major depressive disorder, recurrent, in full remission: Secondary | ICD-10-CM | POA: Insufficient documentation

## 2016-10-02 DIAGNOSIS — R5383 Other fatigue: Secondary | ICD-10-CM

## 2016-10-02 DIAGNOSIS — E039 Hypothyroidism, unspecified: Secondary | ICD-10-CM

## 2016-10-02 DIAGNOSIS — F32A Depression, unspecified: Secondary | ICD-10-CM

## 2016-10-02 MED ORDER — CITALOPRAM HYDROBROMIDE 10 MG PO TABS: 10.0000 mg | ORAL_TABLET | Freq: Every day | ORAL | 3 refills | Status: DC

## 2016-10-02 NOTE — Patient Instructions (Addendum)
Please take all new medication as prescribed - the citalopram (celexa) at 10 mg per day  Please call in 3-4 wks if you feel you need the higher dose  Please continue all other medications as before, and refills have been done if requested.  Please have the pharmacy call with any other refills you may need.  Please continue your efforts at being more active, low cholesterol diet, and weight control.  Please keep your appointments with your specialists as you may have planned  Please return in 6 months, or sooner if needed

## 2016-10-02 NOTE — Assessment & Plan Note (Signed)
stable overall by history and exam, recent data reviewed with pt, and pt to continue medical treatment as before,  to f/u any worsening symptoms or concerns Lab Results  Component Value Date   TSH 3.25 09/24/2016

## 2016-10-02 NOTE — Progress Notes (Signed)
Subjective:    Patient ID: Rodney [name], male    DOB: [birth year], 76 y.o.   MRN: [id number]  HPI  Here to f/u fatigue and has been out of work this past wk;  Pt denies chest pain, increased sob or doe, wheezing, orthopnea, PND, increased LE swelling, palpitations, dizziness or syncope.  Pt denies new neurological symptoms such as new headache, or facial or extremity weakness or numbness   Pt denies polydipsia, polyuria, or low sugar symptoms such as weakness or confusion improved with po intake.  Pt states overall good compliance with meds, trying to follow lower cholesterol, diabetic diet, wt overall stable and still works 40 hr/wk at [employer] where he feels they want him gone due to his age and to able to hire a minority person in his place.  Does c/o ongoing fatigue, but denies signficant daytime hypersomnolence. Does have some decresaed mood and other depressive symptoms but Denies worsening suicidal ideation, or panic.  Has little to no anxiety per pt.  Is proud of his work and wants to keep going, since he's seen his friends retire and many have passed  Saw Dr Plotnikov last wk with fatigue with extensive lab eval neg including ecg and cxr.  Still follows for prostate ca and Mult Myeloma with urology and oncology  Neither of which are more active recently per pt  Denies hyper or hypo thyroid symptoms such as voice, skin or hair change. Past Medical History:  Diagnosis Date  . Anemia 02-16-11   01-05-11- post surgery-Transfusions x 4 units  . Bone pain 02/18/2013  . Complication of anesthesia 02-16-11   Pt. speaks of awakening during the surgery from back  surgery  . Degenerative disc disease 02-16-11   01-05-11 L3 fusion done  . Esophageal reflux 05/31/2008   Qualifier: Diagnosis of  By: Arnoldo Morale MD, Balinda Quails   . Hernia 02-16-11   left inguinal hernia at present  . Hypothyroidism 02/01/2014  . Multiple myeloma 02-16-11   suspected tumor  left sacral  . Pancreatitis   . Personal history  of traumatic fracture 07/10/2012  . Prostate cancer ([place])   . Pulmonary embolism ([place])   . Second degree atrioventricular block 11/26/2012  . Shortness of breath 02-16-11   hx. Pulmonary emboli x2 (9 yrs/ 3'12 -last)   Past Surgical History:  Procedure Laterality Date  . BACK SURGERY  02-16-11    01-05-11 Lumbar surgery L3(complicated by loss of blood volume)/ 01-09-11 then Lumbar fusion done with retained  hardware   . CHOLECYSTECTOMY  04/06/2010   laparoscopic-inflammation with stones  . HERNIA REPAIR  02/20/11   Inguinal hernia repair w/mesh  . INGUINAL HERNIA REPAIR  02/20/2011   Procedure: HERNIA REPAIR INGUINAL ADULT;  Surgeon: Earnstine Regal, MD;  Location: [facility];  Service: General;  Laterality: Left;  Repair Left Inguinal Hernia with Mesh  . PROSTATE SURGERY Bilateral 2011    reports that he quit smoking about 47 years ago. He has never used smokeless tobacco. He reports that he does not drink alcohol or use drugs. family history includes Cancer in his brother and father; Clotting disorder in his mother; Lymphoma (age of onset: 21) in his father. Allergies  Allergen Reactions  . Blood-Group Specific Substance     Must be Leukocyte Reduced and Irradiated due to stem cell transplant.    Current Outpatient Prescriptions on File Prior to Visit  Medication Sig Dispense Refill  . azithromycin (ZITHROMAX Z-PAK) 250 MG tablet 2 tab by mouth  day 1, then 1 per day 6 tablet 1  . bacitracin ointment Apply 1 application topically 2 (two) times daily. To the left great toe for 7 days 120 g 0  . calcium carbonate (TUMS - DOSED IN MG ELEMENTAL CALCIUM) 500 MG chewable tablet Chew 1 tablet by mouth every 2 (two) hours as needed. HEART BURN      . Cholecalciferol (VITAMIN D) 1000 UNITS capsule Take 4,000 Units by mouth daily.     . cyclobenzaprine (FLEXERIL) 10 MG tablet TAKE ONE TABLET BY MOUTH THREE TIMES DAILY AS NEEDED FOR MUSCLE SPASMS 30 tablet 3  . DULoxetine (CYMBALTA) 60 MG capsule Take 1  capsule (60 mg total) by mouth daily. 90 capsule 3  . gabapentin (NEURONTIN) 300 MG capsule Take 300 mg by mouth daily as needed. Nerve pain    . HYDROcodone-acetaminophen (NORCO/VICODIN) 5-325 MG tablet Take 1-2 tablets by mouth every 6 (six) hours as needed for moderate pain.    Marland Kitchen levothyroxine (SYNTHROID, LEVOTHROID) 100 MCG tablet Take 1 tablet (100 mcg total) by mouth daily. 90 tablet 3  . mirabegron ER (MYRBETRIQ) 25 MG TB24 tablet Take 25 mg by mouth daily.    Marland Kitchen omeprazole (PRILOSEC) 20 MG capsule Take 20 mg by mouth 2 (two) times daily before a meal. HEART BURN     . ondansetron (ZOFRAN ODT) 4 MG disintegrating tablet Take 1 tablet (4 mg total) by mouth every 8 (eight) hours as needed for nausea or vomiting. 10 tablet 0  . ondansetron (ZOFRAN) 4 MG tablet Take 1 tablet (4 mg total) by mouth every 8 (eight) hours as needed for nausea or vomiting. 20 tablet 0  . POTASSIUM PO Take 1 tablet by mouth daily.    Marland Kitchen REVLIMID 10 MG capsule Take 10 mg by mouth daily. 21 days on and 7 days off    . warfarin (COUMADIN) 5 MG tablet TAKE BY MOUTH AS DIRECTED BY ANTICOAGULATION CLINIC 30 tablet 3   No current facility-administered medications on file prior to visit.    Review of Systems  Constitutional: Negative for other unusual diaphoresis or sweats HENT: Negative for ear discharge or swelling Eyes: Negative for other worsening visual disturbances Respiratory: Negative for stridor or other swelling  Gastrointestinal: Negative for worsening distension or other blood Genitourinary: Negative for retention or other urinary change Musculoskeletal: Negative for other MSK pain or swelling Skin: Negative for color change or other new lesions Neurological: Negative for worsening tremors and other numbness  Psychiatric/Behavioral: Negative for worsening agitation or other fatigue All other system neg per pt    Objective:   Physical Exam BP 120/76   Pulse 60   Ht _0  (1.803 m)   Wt 200 lb (90.7  kg)   SpO2 98%   BMI 27.89 kg/m  VS noted, not ill appearing Constitutional: Pt appears in NAD HENT: Head: NCAT.  Right Ear: External ear normal.  Left Ear: External ear normal.  Eyes: . Pupils are equal, round, and reactive to light. Conjunctivae and EOM are normal Nose: without d/c or deformity Neck: Neck supple. Gross normal ROM Cardiovascular: Normal rate and regular rhythm.   Pulmonary/Chest: Effort normal and breath sounds without rales or wheezing.  Abd:  Soft, NT, ND, + BS, no organomegaly Neurological: Pt is alert. At baseline orientation, motor grossly intact Skin: Skin is warm. No rashes, other new lesions, no LE edema Psychiatric: Pt behavior is normal without agitation  but has depressed affect, mild psychomotor retardation  Lab Results  Component Value Date   WBC 5.6 09/24/2016   HGB 14.5 09/24/2016   HCT 43.0 09/24/2016   PLT 190.0 09/24/2016   GLUCOSE 93 09/24/2016   CHOL 109 02/17/2016   TRIG 153.0 (H) 02/17/2016   HDL 34.30 (L) 02/17/2016   LDLDIRECT 98.3 05/31/2008   LDLCALC 44 02/17/2016   ALT 24 09/24/2016   AST 21 09/24/2016   NA 138 09/24/2016   K 3.8 09/24/2016   CL 105 09/24/2016   CREATININE 1.09 09/24/2016   BUN 15 09/24/2016   CO2 26 09/24/2016   TSH 3.25 09/24/2016   PSA 0.00 Repeated and verified X2. (L) 09/24/2016   INR 1.9 09/24/2016        Assessment & Plan:

## 2016-10-02 NOTE — Telephone Encounter (Signed)
Sounds great, thanks.

## 2016-10-02 NOTE — Telephone Encounter (Signed)
Patient is requesting a return to work letter. He has been out of work since 7/23 with a return date of Aug 1. Please advise.  It will need to be fax to Fax# [phone]   If you would like for me to type this up please advise. Thank you.

## 2016-10-02 NOTE — Assessment & Plan Note (Addendum)
Possibly situational due to work situation vs major depression, denies SI or HI, ok to return to work as planned, but start celexa 10 qd with option to increase to 20 in 3-4 wks if needed; declines referral for counseling or psychiatry

## 2016-10-02 NOTE — Telephone Encounter (Signed)
Letter has been faxed. Patient informed. Original has been mailed to patient.

## 2016-10-02 NOTE — Assessment & Plan Note (Signed)
OK to follow for now, recent evaluation o/w negative

## 2016-10-15 DIAGNOSIS — Z125 Encounter for screening for malignant neoplasm of prostate: Secondary | ICD-10-CM | POA: Diagnosis not present

## 2016-10-15 DIAGNOSIS — R6889 Other general symptoms and signs: Secondary | ICD-10-CM | POA: Diagnosis not present

## 2016-10-15 DIAGNOSIS — Z79899 Other long term (current) drug therapy: Secondary | ICD-10-CM | POA: Diagnosis not present

## 2016-10-15 DIAGNOSIS — Z0279 Encounter for issue of other medical certificate: Secondary | ICD-10-CM

## 2016-10-15 DIAGNOSIS — C9000 Multiple myeloma not having achieved remission: Secondary | ICD-10-CM | POA: Diagnosis not present

## 2016-10-22 DIAGNOSIS — Z961 Presence of intraocular lens: Secondary | ICD-10-CM | POA: Diagnosis not present

## 2016-10-22 DIAGNOSIS — H01001 Unspecified blepharitis right upper eyelid: Secondary | ICD-10-CM | POA: Diagnosis not present

## 2016-10-22 DIAGNOSIS — H43813 Vitreous degeneration, bilateral: Secondary | ICD-10-CM | POA: Diagnosis not present

## 2016-10-22 DIAGNOSIS — H33301 Unspecified retinal break, right eye: Secondary | ICD-10-CM | POA: Diagnosis not present

## 2016-10-23 DIAGNOSIS — H43811 Vitreous degeneration, right eye: Secondary | ICD-10-CM | POA: Diagnosis not present

## 2016-10-23 DIAGNOSIS — H33301 Unspecified retinal break, right eye: Secondary | ICD-10-CM | POA: Diagnosis not present

## 2016-10-25 ENCOUNTER — Other Ambulatory Visit: Payer: Self-pay | Admitting: Internal Medicine

## 2016-10-26 ENCOUNTER — Other Ambulatory Visit: Payer: Self-pay | Admitting: General Practice

## 2016-10-26 MED ORDER — WARFARIN SODIUM 5 MG PO TABS: ORAL_TABLET | ORAL | 3 refills | Status: DC

## 2016-11-09 ENCOUNTER — Ambulatory Visit (INDEPENDENT_AMBULATORY_CARE_PROVIDER_SITE_OTHER): Payer: Medicare Other | Admitting: General Practice

## 2016-11-09 DIAGNOSIS — Z5181 Encounter for therapeutic drug level monitoring: Secondary | ICD-10-CM

## 2016-11-09 DIAGNOSIS — Z7901 Long term (current) use of anticoagulants: Secondary | ICD-10-CM

## 2016-11-09 LAB — POCT INR: INR: 2.4

## 2016-11-09 NOTE — Patient Instructions (Signed)
Pre visit review using our clinic review tool, if applicable. No additional management support is needed unless otherwise documented below in the visit note. 

## 2016-11-09 NOTE — Progress Notes (Signed)
I have reviewed and agree with the plan. 

## 2016-11-10 ENCOUNTER — Other Ambulatory Visit: Payer: Self-pay | Admitting: Internal Medicine

## 2016-11-10 DIAGNOSIS — M545 Low back pain, unspecified: Secondary | ICD-10-CM

## 2016-11-10 DIAGNOSIS — C9000 Multiple myeloma not having achieved remission: Secondary | ICD-10-CM

## 2016-11-14 DIAGNOSIS — C44319 Basal cell carcinoma of skin of other parts of face: Secondary | ICD-10-CM | POA: Diagnosis not present

## 2016-12-14 ENCOUNTER — Ambulatory Visit (INDEPENDENT_AMBULATORY_CARE_PROVIDER_SITE_OTHER): Payer: BLUE CROSS/BLUE SHIELD | Admitting: General Practice

## 2016-12-14 DIAGNOSIS — Z5181 Encounter for therapeutic drug level monitoring: Secondary | ICD-10-CM

## 2016-12-14 DIAGNOSIS — Z7901 Long term (current) use of anticoagulants: Secondary | ICD-10-CM

## 2016-12-14 NOTE — Patient Instructions (Signed)
Pre visit review using our clinic review tool, if applicable. No additional management support is needed unless otherwise documented below in the visit note. 

## 2016-12-17 ENCOUNTER — Ambulatory Visit (INDEPENDENT_AMBULATORY_CARE_PROVIDER_SITE_OTHER): Payer: Medicare Other | Admitting: General Practice

## 2016-12-17 DIAGNOSIS — Z7901 Long term (current) use of anticoagulants: Secondary | ICD-10-CM

## 2016-12-17 LAB — POCT INR: INR: 2.1

## 2016-12-17 NOTE — Patient Instructions (Signed)
Pre visit review using our clinic review tool, if applicable. No additional management support is needed unless otherwise documented below in the visit note. 

## 2016-12-17 NOTE — Progress Notes (Signed)
I agree with this plan.

## 2016-12-19 ENCOUNTER — Encounter: Payer: Self-pay | Admitting: Internal Medicine

## 2016-12-19 ENCOUNTER — Ambulatory Visit (INDEPENDENT_AMBULATORY_CARE_PROVIDER_SITE_OTHER): Payer: Medicare Other | Admitting: Internal Medicine

## 2016-12-19 VITALS — BP 114/74 | HR 66 | Temp 98.6°F | Ht 71.0 in | Wt 199.0 lb

## 2016-12-19 DIAGNOSIS — M546 Pain in thoracic spine: Secondary | ICD-10-CM | POA: Diagnosis not present

## 2016-12-19 DIAGNOSIS — J309 Allergic rhinitis, unspecified: Secondary | ICD-10-CM | POA: Diagnosis not present

## 2016-12-19 DIAGNOSIS — R079 Chest pain, unspecified: Secondary | ICD-10-CM

## 2016-12-19 DIAGNOSIS — M549 Dorsalgia, unspecified: Secondary | ICD-10-CM | POA: Diagnosis not present

## 2016-12-19 DIAGNOSIS — K219 Gastro-esophageal reflux disease without esophagitis: Secondary | ICD-10-CM

## 2016-12-19 DIAGNOSIS — G8929 Other chronic pain: Secondary | ICD-10-CM

## 2016-12-19 MED ORDER — NABUMETONE 500 MG PO TABS: 500.0000 mg | ORAL_TABLET | Freq: Two times a day (BID) | ORAL | 1 refills | Status: DC | PRN

## 2016-12-19 NOTE — Assessment & Plan Note (Signed)
Pt averse to new medications, but also encouraged otc claritin prn as very unlikely to have side effect

## 2016-12-19 NOTE — Patient Instructions (Signed)
Your EKG was OK today  Please take all new medication as prescribed - the anti-inflammatory  You can also take Claritin 10 mg OTC as needed for allergies as this is safe  Please continue all other medications as before, and refills have been done if requested.  Please have the pharmacy call with any other refills you may need.  Please keep your appointments with your specialists as you may have planned

## 2016-12-19 NOTE — Progress Notes (Signed)
Subjective:    Patient ID: Rodney [name], male    DOB: 1940-03-27, 76 y.o.   MRN: [id number]  HPI  Here with 3 days onset right paravertebral sharp pain upper thoracic with tenderness at the area, radiation around the right to the midsternal area, + pleuritic and positional, but not exertional. No assoc sob, n/v, palp, diaphoresis or dizziness.  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  Pain somewhat worse with this.  Has muscle relaxer but doesnot seem muscle to him so did not try.  Has long hx of chronic lumbar pain s/p mult surgurie and disc implantation.  Has tried the neurontin at bedtime but no help with the new pain.  Nothing else seems to make better or worse.   Pt denies fever, wt loss, night sweats, loss of appetite, or other constitutional symptoms, though hx of multiple malignancy including left forehead skin ca fairly recently.  Does have several wks ongoing nasal allergy symptoms with clearish congestion, itch and sneezing, and occaisional cough. Pt denies other chest pain, increased sob or doe, wheezing, orthopnea, PND, increased LE swelling, or syncope.  Denies worsening reflux, abd pain, dysphagia, n/v, bowel change or blood.  Does also have chronic LBP (lumbar) but different from this pain  Gabapentin for this does not help the thoracic pain Past Medical History:  Diagnosis Date  . Anemia 02-16-11   01-05-11- post surgery-Transfusions x 4 units  . Bone pain 02/18/2013  . Complication of anesthesia 02-16-11   Pt. speaks of awakening during the surgery from back  surgery  . Degenerative disc disease 02-16-11   01-05-11 L3 fusion done  . Esophageal reflux 05/31/2008   Qualifier: Diagnosis of  By: Arnoldo Morale MD, Balinda Quails   . Hernia 02-16-11   left inguinal hernia at present  . Hypothyroidism 02/01/2014  . Multiple myeloma 02-16-11   suspected tumor  left sacral  . Pancreatitis   . Personal history of traumatic fracture 07/10/2012  . Prostate cancer ([place])   . Pulmonary embolism  ([place])   . Second degree atrioventricular block 11/26/2012  . Shortness of breath 02-16-11   hx. Pulmonary emboli x2 (9 yrs/ 3'12 -last)   Past Surgical History:  Procedure Laterality Date  . BACK SURGERY  02-16-11    01-05-11 Lumbar surgery L3(complicated by loss of blood volume)/ 01-09-11 then Lumbar fusion done with retained  hardware   . CHOLECYSTECTOMY  04/06/2010   laparoscopic-inflammation with stones  . HERNIA REPAIR  02/20/11   Inguinal hernia repair w/mesh  . INGUINAL HERNIA REPAIR  02/20/2011   Procedure: HERNIA REPAIR INGUINAL ADULT;  Surgeon: Earnstine Regal, MD;  Location: [facility];  Service: General;  Laterality: Left;  Repair Left Inguinal Hernia with Mesh  . PROSTATE SURGERY Bilateral 2011    reports that he quit smoking about 47 years ago. He has never used smokeless tobacco. He reports that he does not drink alcohol or use drugs. family history includes Cancer in his brother and father; Clotting disorder in his mother; Lymphoma (age of onset: 60) in his father. Allergies  Allergen Reactions  . Blood-Group Specific Substance     Must be Leukocyte Reduced and Irradiated due to stem cell transplant.    Current Outpatient Prescriptions on File Prior to Visit  Medication Sig Dispense Refill  . bacitracin ointment Apply 1 application topically 2 (two) times daily. To the left great toe for 7 days 120 g 0  . calcium carbonate (TUMS - DOSED IN MG ELEMENTAL  CALCIUM) 500 MG chewable tablet Chew 1 tablet by mouth every 2 (two) hours as needed. HEART BURN      . Cholecalciferol (VITAMIN D) 1000 UNITS capsule Take 4,000 Units by mouth daily.     . citalopram (CELEXA) 10 MG tablet Take 1 tablet (10 mg total) by mouth daily. 90 tablet 3  . DULoxetine (CYMBALTA) 60 MG capsule Take 1 capsule (60 mg total) by mouth daily. 90 capsule 3  . gabapentin (NEURONTIN) 300 MG capsule Take 300 mg by mouth daily as needed. Nerve pain    . levothyroxine (SYNTHROID, LEVOTHROID) 100 MCG tablet Take 1  tablet (100 mcg total) by mouth daily. 90 tablet 3  . mirabegron ER (MYRBETRIQ) 25 MG TB24 tablet Take 25 mg by mouth daily.    Marland Kitchen omeprazole (PRILOSEC) 20 MG capsule Take 20 mg by mouth 2 (two) times daily before a meal. HEART BURN     . ondansetron (ZOFRAN) 4 MG tablet Take 1 tablet (4 mg total) by mouth every 8 (eight) hours as needed for nausea or vomiting. 20 tablet 0  . POTASSIUM PO Take 1 tablet by mouth daily.    Marland Kitchen REVLIMID 10 MG capsule Take 10 mg by mouth daily. 21 days on and 7 days off    . warfarin (COUMADIN) 5 MG tablet TAKE BY MOUTH AS DIRECTED BY ANTICOAGULATION CLINIC 30 tablet 3   No current facility-administered medications on file prior to visit.    Review of Systems  Constitutional: Negative for other unusual diaphoresis or sweats HENT: Negative for ear discharge or swelling Eyes: Negative for other worsening visual disturbances Respiratory: Negative for stridor or other swelling  Gastrointestinal: Negative for worsening distension or other blood Genitourinary: Negative for retention or other urinary change Musculoskeletal: Negative for other MSK pain or swelling Skin: Negative for color change or other new lesions Neurological: Negative for worsening tremors and other numbness  Psychiatric/Behavioral: Negative for worsening agitation or other fatigue All other system neg per pt    Objective:   Physical Exam BP 114/74   Pulse 66   Temp 98.6 F (37 C) (Oral)   Ht '5\' 11"'$  (1.803 m)   Wt 199 lb (90.3 kg)   SpO2 99%   BMI 27.75 kg/m  VS noted,  Constitutional: Pt appears in NAD HENT: Head: NCAT.  Right Ear: External ear normal.  Left Ear: External ear normal.  Eyes: . Pupils are equal, round, and reactive to light. Conjunctivae and EOM are normal Nose: without d/c or deformity Bilat tm's with mild erythema.  Max sinus areas non tender.  Pharynx with mild erythema, no exudate Neck: Neck supple. Gross normal ROM Cardiovascular: Normal rate and regular rhythm.    Pulmonary/Chest: Effort normal and breath sounds without rales or wheezing.  Abd:  Soft, NT, ND, + BS, no organomegaly Spine: tender thoracic right paravertebral about t4 without swelling, rash;  Chronic midline lumbar tender noted Neurological: Pt is alert. At baseline orientation, motor grossly intact Skin: Skin is warm. No rashes, other new lesions, no LE edema Psychiatric: Pt behavior is normal without agitation  No other exam findings  ECG I have personally interpreted today NSR with RBBB - no change from previous  Lab Results  Component Value Date   WBC 5.6 09/24/2016   HGB 14.5 09/24/2016   HCT 43.0 09/24/2016   PLT 190.0 09/24/2016   GLUCOSE 93 09/24/2016   CHOL 109 02/17/2016   TRIG 153.0 (H) 02/17/2016   HDL 34.30 (L) 02/17/2016  LDLDIRECT 98.3 05/31/2008   LDLCALC 44 02/17/2016   ALT 24 09/24/2016   AST 21 09/24/2016   NA 138 09/24/2016   K 3.8 09/24/2016   CL 105 09/24/2016   CREATININE 1.09 09/24/2016   BUN 15 09/24/2016   CO2 26 09/24/2016   TSH 3.25 09/24/2016   PSA 0.00 Repeated and verified X2. (L) 09/24/2016   INR 2.1 12/17/2016   CHEST  2 VIEW - summary - 09/24/2016 FINDINGS: The heart size and mediastinal contours are within normal limits. Both lungs are clear. The visualized skeletal structures show mild degenerative change of thoracic spine.  IMPRESSION: No active cardiopulmonary disease.        Assessment & Plan:

## 2016-12-19 NOTE — Assessment & Plan Note (Signed)
stable overall by history and exam, and pt to continue medical treatment as before,  to f/u any worsening symptoms or concerns 

## 2016-12-19 NOTE — Assessment & Plan Note (Signed)
Suspect pt with msk pain possibly thoracic DJD related vs muscle vs even pleurisy, have very low suspicion for cardiac, ecg reviewed, declines repeat cxr, for nsaid prn,  to f/u any worsening symptoms or concerns

## 2016-12-19 NOTE — Assessment & Plan Note (Signed)
O/w stable overall by history and exam, and pt to continue medical treatment as before,  to f/u any worsening symptoms or concerns

## 2017-01-11 ENCOUNTER — Telehealth: Payer: Self-pay | Admitting: Internal Medicine

## 2017-01-11 NOTE — Telephone Encounter (Signed)
Ok with me 

## 2017-01-11 NOTE — Telephone Encounter (Signed)
Fine with me

## 2017-01-11 NOTE — Telephone Encounter (Signed)
Patient would like to transfer to Dr. Yong Channel (wife is [facility] patient) due to preferring another provider and convenience. Out of courtesy for both providers I will await approval before calling and notifying the patient of the transfer. Please respond with an approval or denial of transfer.

## 2017-01-14 DIAGNOSIS — C9000 Multiple myeloma not having achieved remission: Secondary | ICD-10-CM | POA: Diagnosis not present

## 2017-01-14 DIAGNOSIS — Z23 Encounter for immunization: Secondary | ICD-10-CM | POA: Diagnosis not present

## 2017-01-14 DIAGNOSIS — R6889 Other general symptoms and signs: Secondary | ICD-10-CM | POA: Diagnosis not present

## 2017-01-14 NOTE — Telephone Encounter (Signed)
I attempted to call and notify patient that transfer to Dr. Yong Channel was approved, mailbox was full.

## 2017-01-21 ENCOUNTER — Ambulatory Visit: Payer: BLUE CROSS/BLUE SHIELD | Admitting: General Practice

## 2017-01-21 NOTE — Progress Notes (Signed)
I agree with this plan.

## 2017-01-21 NOTE — Patient Instructions (Signed)
Pre visit review using our clinic review tool, if applicable. No additional management support is needed unless otherwise documented below in the visit note. 

## 2017-01-28 ENCOUNTER — Telehealth: Payer: Self-pay

## 2017-01-28 NOTE — Telephone Encounter (Signed)
Left message to call nurse back regarding below message.

## 2017-01-28 NOTE — Telephone Encounter (Signed)
-----   Message from Heath Lark, MD sent at 01/28/2017  9:28 AM EST ----- Regarding: Not seen since Feb Hi Brenda,  He has not seen me since Feb. Not sure what happened. He just saw his oncologist in [place]. The note I got from Juliann Pulse was he needs frequent follow-up here. I am not sure if patients want to come back or not of whether he wants to resume zometa as well.  Let me know what he thinks if 1) he wants to follow-up here 2) he wants to resume Zometa  I will put orders once I hear back

## 2017-01-31 ENCOUNTER — Other Ambulatory Visit: Payer: Self-pay | Admitting: Hematology and Oncology

## 2017-01-31 ENCOUNTER — Ambulatory Visit: Payer: Medicare Other | Admitting: Family Medicine

## 2017-01-31 DIAGNOSIS — C9001 Multiple myeloma in remission: Secondary | ICD-10-CM

## 2017-01-31 NOTE — Telephone Encounter (Signed)
I will place scheduling msg 

## 2017-01-31 NOTE — Telephone Encounter (Signed)
Called regarding below message. He would like to follow up with Dr. Alvy Bimler and resume Zometa.

## 2017-02-04 ENCOUNTER — Telehealth: Payer: Self-pay | Admitting: Hematology and Oncology

## 2017-02-04 NOTE — Telephone Encounter (Signed)
Scheduled appt per 11/29 sch message - left message with appt date and time and number to call back if any questions.

## 2017-02-13 ENCOUNTER — Ambulatory Visit: Payer: Medicare Other | Admitting: Hematology and Oncology

## 2017-02-13 ENCOUNTER — Other Ambulatory Visit: Payer: Medicare Other

## 2017-02-13 ENCOUNTER — Encounter: Payer: Self-pay | Admitting: Hematology and Oncology

## 2017-02-13 ENCOUNTER — Ambulatory Visit: Payer: Medicare Other

## 2017-02-14 ENCOUNTER — Telehealth: Payer: Self-pay | Admitting: Hematology and Oncology

## 2017-02-14 NOTE — Telephone Encounter (Signed)
R/s missed appt from 12/12- patient is aware of new appt date and time - gave patient calender.

## 2017-02-15 ENCOUNTER — Ambulatory Visit (INDEPENDENT_AMBULATORY_CARE_PROVIDER_SITE_OTHER): Payer: Medicare Other | Admitting: General Practice

## 2017-02-15 DIAGNOSIS — Z7901 Long term (current) use of anticoagulants: Secondary | ICD-10-CM

## 2017-02-15 LAB — POCT INR: INR: 2.6

## 2017-02-15 NOTE — Patient Instructions (Addendum)
Pre visit review using our clinic review tool, if applicable. No additional management support is needed unless otherwise documented below in the visit note.  Continue to take 1 tablet daily except 1/2 tablet on Wednesdays and Saturdays.  Re-check in 4 weeks.    

## 2017-02-22 DIAGNOSIS — C61 Malignant neoplasm of prostate: Secondary | ICD-10-CM | POA: Diagnosis not present

## 2017-02-22 DIAGNOSIS — N3281 Overactive bladder: Secondary | ICD-10-CM | POA: Diagnosis not present

## 2017-02-22 DIAGNOSIS — R3915 Urgency of urination: Secondary | ICD-10-CM | POA: Diagnosis not present

## 2017-03-10 ENCOUNTER — Other Ambulatory Visit: Payer: Self-pay | Admitting: Internal Medicine

## 2017-03-10 DIAGNOSIS — M545 Low back pain, unspecified: Secondary | ICD-10-CM

## 2017-03-10 DIAGNOSIS — C9000 Multiple myeloma not having achieved remission: Secondary | ICD-10-CM

## 2017-03-11 ENCOUNTER — Other Ambulatory Visit: Payer: Self-pay | Admitting: General Practice

## 2017-03-11 MED ORDER — WARFARIN SODIUM 5 MG PO TABS: ORAL_TABLET | ORAL | 3 refills | Status: DC

## 2017-03-15 ENCOUNTER — Other Ambulatory Visit: Payer: Self-pay | Admitting: Hematology and Oncology

## 2017-03-15 ENCOUNTER — Ambulatory Visit (INDEPENDENT_AMBULATORY_CARE_PROVIDER_SITE_OTHER): Payer: Medicare Other | Admitting: General Practice

## 2017-03-15 DIAGNOSIS — C9001 Multiple myeloma in remission: Secondary | ICD-10-CM

## 2017-03-15 DIAGNOSIS — Z7901 Long term (current) use of anticoagulants: Secondary | ICD-10-CM

## 2017-03-15 LAB — POCT INR: INR: 2.2

## 2017-03-15 NOTE — Patient Instructions (Addendum)
Pre visit review using our clinic review tool, if applicable. No additional management support is needed unless otherwise documented below in the visit note.  Continue to take 1 tablet daily except 1/2 tablet on Wednesdays and Saturdays.  Re-check in 4 weeks.    

## 2017-03-18 ENCOUNTER — Other Ambulatory Visit: Payer: Medicare Other

## 2017-03-18 ENCOUNTER — Ambulatory Visit: Payer: Medicare Other | Admitting: Hematology and Oncology

## 2017-03-18 ENCOUNTER — Ambulatory Visit: Payer: Medicare Other

## 2017-04-04 ENCOUNTER — Ambulatory Visit: Payer: BLUE CROSS/BLUE SHIELD | Admitting: Internal Medicine

## 2017-04-04 DIAGNOSIS — Z0289 Encounter for other administrative examinations: Secondary | ICD-10-CM

## 2017-04-08 ENCOUNTER — Encounter: Payer: Self-pay | Admitting: Hematology and Oncology

## 2017-04-08 ENCOUNTER — Inpatient Hospital Stay: Payer: Medicare Other | Attending: Hematology and Oncology

## 2017-04-08 ENCOUNTER — Inpatient Hospital Stay: Payer: Medicare Other

## 2017-04-08 ENCOUNTER — Inpatient Hospital Stay (HOSPITAL_BASED_OUTPATIENT_CLINIC_OR_DEPARTMENT_OTHER): Payer: Medicare Other | Admitting: Hematology and Oncology

## 2017-04-08 VITALS — BP 129/64 | HR 70 | Temp 98.8°F | Resp 18 | Ht 71.0 in | Wt 198.4 lb

## 2017-04-08 DIAGNOSIS — M545 Low back pain, unspecified: Secondary | ICD-10-CM

## 2017-04-08 DIAGNOSIS — C9001 Multiple myeloma in remission: Secondary | ICD-10-CM

## 2017-04-08 LAB — CBC WITH DIFFERENTIAL/PLATELET
BASOS ABS: 0.1 10*3/uL (ref 0.0–0.1)
Basophils Relative: 3 %
Eosinophils Absolute: 0.1 10*3/uL (ref 0.0–0.5)
Eosinophils Relative: 2 %
HEMATOCRIT: 41.9 % (ref 38.4–49.9)
Hemoglobin: 14.4 g/dL (ref 13.0–17.1)
LYMPHS PCT: 18 %
Lymphs Abs: 0.9 10*3/uL (ref 0.9–3.3)
MCH: 33.1 pg (ref 27.2–33.4)
MCHC: 34.4 g/dL (ref 32.0–36.0)
MCV: 96.3 fL (ref 79.3–98.0)
MONO ABS: 0.7 10*3/uL (ref 0.1–0.9)
MONOS PCT: 14 %
NEUTROS ABS: 3.4 10*3/uL (ref 1.5–6.5)
Neutrophils Relative %: 63 %
Platelets: 168 10*3/uL (ref 140–400)
RBC: 4.35 MIL/uL (ref 4.20–5.82)
RDW: 14.8 % — AB (ref 11.0–14.6)
WBC: 5.3 10*3/uL (ref 4.0–10.3)

## 2017-04-08 LAB — COMPREHENSIVE METABOLIC PANEL
ALK PHOS: 113 U/L (ref 40–150)
ALT: 29 U/L (ref 0–55)
AST: 23 U/L (ref 5–34)
Albumin: 3.8 g/dL (ref 3.5–5.0)
Anion gap: 9 (ref 3–11)
BILIRUBIN TOTAL: 0.6 mg/dL (ref 0.2–1.2)
BUN: 19 mg/dL (ref 7–26)
CALCIUM: 8.5 mg/dL (ref 8.4–10.4)
CO2: 20 mmol/L — ABNORMAL LOW (ref 22–29)
CREATININE: 1.11 mg/dL (ref 0.70–1.30)
Chloride: 110 mmol/L — ABNORMAL HIGH (ref 98–109)
GFR calc Af Amer: >60 mL/min (ref >60)
Glucose, Bld: 109 mg/dL (ref 70–140)
POTASSIUM: 3.6 mmol/L (ref 3.5–5.1)
Sodium: 139 mmol/L (ref 136–145)
TOTAL PROTEIN: 7.2 g/dL (ref 6.4–8.3)

## 2017-04-09 ENCOUNTER — Encounter: Payer: Self-pay | Admitting: Family Medicine

## 2017-04-09 ENCOUNTER — Ambulatory Visit (INDEPENDENT_AMBULATORY_CARE_PROVIDER_SITE_OTHER): Payer: BLUE CROSS/BLUE SHIELD | Admitting: Family Medicine

## 2017-04-09 VITALS — BP 112/72 | HR 78 | Temp 98.8°F | Ht 69.5 in | Wt 197.4 lb

## 2017-04-09 DIAGNOSIS — E559 Vitamin D deficiency, unspecified: Secondary | ICD-10-CM | POA: Insufficient documentation

## 2017-04-09 DIAGNOSIS — Z85828 Personal history of other malignant neoplasm of skin: Secondary | ICD-10-CM | POA: Insufficient documentation

## 2017-04-09 DIAGNOSIS — C9001 Multiple myeloma in remission: Secondary | ICD-10-CM

## 2017-04-09 DIAGNOSIS — I2699 Other pulmonary embolism without acute cor pulmonale: Secondary | ICD-10-CM | POA: Diagnosis not present

## 2017-04-09 DIAGNOSIS — E039 Hypothyroidism, unspecified: Secondary | ICD-10-CM

## 2017-04-09 DIAGNOSIS — F3342 Major depressive disorder, recurrent, in full remission: Secondary | ICD-10-CM | POA: Diagnosis not present

## 2017-04-09 DIAGNOSIS — N3281 Overactive bladder: Secondary | ICD-10-CM | POA: Insufficient documentation

## 2017-04-09 LAB — KAPPA/LAMBDA LIGHT CHAINS
KAPPA, LAMDA LIGHT CHAIN RATIO: 1.12 (ref 0.26–1.65)
Kappa free light chain: 45.4 mg/L — ABNORMAL HIGH (ref 3.3–19.4)
Lambda free light chains: 40.7 mg/L — ABNORMAL HIGH (ref 5.7–26.3)

## 2017-04-09 NOTE — Assessment & Plan Note (Signed)
S: compliant with omeprazole 20mg  BID. Very rarely needs a tums in addition to this- had to do some recently and hadnt been years otherwise A/P: would consider trying once a day dosing of omeprazole

## 2017-04-09 NOTE — Assessment & Plan Note (Signed)
Major depression on both celexa and cymbalta. PHQ9 today of 0. Likely on cymbalta for chronic pain but with excellent conrol try to wean celexa 10mg - take half tablet until follow up. He will come with his wife who apparently knows why he is on medicine better than he does- in regards to his celexa and cymbalta, potassium, gabapentin.

## 2017-04-09 NOTE — Patient Instructions (Addendum)
Try celexa half tablet daily for next 2 months- if you have worsening symptoms of depression please let us know immediately.   Lets follow up in about 2-3 months and have your wife come in with you since she seems to know your medicines better. Try for a Thursday with scheduling  Relafen really is not ideal being on coumadin- try to minimize use if you can. Flexeril is ok with coumadin. Tylenol is low risk.

## 2017-04-09 NOTE — Assessment & Plan Note (Signed)
S: Has had 2 PEs so now on long term anticoagulation. One was after prostatectomy, one was after back surgery A/P: continue long term coumadin through our clinic with [facility]

## 2017-04-09 NOTE — Assessment & Plan Note (Signed)
Per notes from Dr. Alvy Bimler patient is in remission. He is compliant with Revlimid. 21 days on, 7 days off. Follows in [place] as well where he had a stem cell transplant and per his report he is in a clinical trial

## 2017-04-09 NOTE — Progress Notes (Signed)
Phone: [phone]  Subjective:  Patient presents today to establish care with me as their new primary care provider. Patient was formerly a patient of Dr. Jenny Reichmann. Chief complaint-noted.   See problem oriented charting ROS- issues with low back and lumbar pain. No leg weakness. No chest pain. No shortness of breath reported.   The following were reviewed and entered/updated in epic: Past Medical History:  Diagnosis Date  . Anemia 02-16-11   01-05-11- post surgery-Transfusions x 4 units  . Bone pain 02/18/2013  . Complication of anesthesia 02-16-11   Pt. speaks of awakening during the surgery from back  surgery  . Degenerative disc disease 02-16-11   01-05-11 L3 fusion done  . Esophageal reflux 05/31/2008   Qualifier: Diagnosis of  By: Arnoldo Morale MD, Balinda Quails   . Hernia 02-16-11   left inguinal hernia at present  . Hypothyroidism 02/01/2014  . Multiple myeloma 02-16-11   suspected tumor  left sacral  . Pancreatitis   . Personal history of traumatic fracture 07/10/2012  . Prostate cancer ([place])   . Pulmonary embolism ([place])   . Second degree atrioventricular block 11/26/2012  . Shortness of breath 02-16-11   hx. Pulmonary emboli x2 (9 yrs/ 3'12 -last)   Patient Active Problem List   Diagnosis Date Noted  . Multiple myeloma in remission ([place]) 07/10/2012    Priority: High  . PROSTATE CANCER, HX OF 01/05/2008    Priority: High  . Pulmonary embolism ([place]) 01/05/2008    Priority: High  . Overactive bladder 04/09/2017    Priority: Medium  . History of skin cancer 04/09/2017    Priority: Medium  . Recurrent major depression in full remission ([place]) 10/02/2016    Priority: Medium  . Bilateral hip pain 04/06/2016    Priority: Medium  . Hypothyroidism 02/01/2014    Priority: Medium  . Back pain, lumbosacral 02/11/2012    Priority: Medium  . Esophageal reflux 05/31/2008    Priority: Medium  . Vitamin D deficiency 04/09/2017    Priority: Low  . Back pain, thoracic 12/19/2016   Priority: Low  . Long term (current) use of anticoagulants 12/14/2016    Priority: Low  . Lump of skin 04/23/2016    Priority: Low  . Carpal tunnel syndrome on both sides 01/24/2016    Priority: Low  . Right foot pain 10/05/2015    Priority: Low  . Tendonitis of ankle, left 10/03/2015    Priority: Low  . Allergic rhinitis 02/01/2014    Priority: Low  . Bone pain 02/18/2013    Priority: Low  . History of duodenal ulcer 01/05/2008    Priority: Low   Past Surgical History:  Procedure Laterality Date  . BACK SURGERY  02-16-11    01-05-11 Lumbar surgery L3(complicated by loss of blood volume)/ 01-09-11 then Lumbar fusion done with retained  hardware   . CHOLECYSTECTOMY  04/06/2010   laparoscopic-inflammation with stones  . HERNIA REPAIR  02/20/11   Inguinal hernia repair w/mesh  . INGUINAL HERNIA REPAIR  02/20/2011   Procedure: HERNIA REPAIR INGUINAL ADULT;  Surgeon: Earnstine Regal, MD;  Location: [facility];  Service: General;  Laterality: Left;  Repair Left Inguinal Hernia with Mesh  . PROSTATE SURGERY Bilateral 2011    Family History  Problem Relation Age of Onset  . Lymphoma Father 8  . Cancer Father   . Clotting disorder Mother        blood clots  . Cancer Brother        throat ca  Medications- reviewed and updated Current Outpatient Medications  Medication Sig Dispense Refill  . bacitracin ointment Apply 1 application topically 2 (two) times daily. To the left great toe for 7 days 120 g 0  . calcium carbonate (TUMS - DOSED IN MG ELEMENTAL CALCIUM) 500 MG chewable tablet Chew 1 tablet by mouth every 2 (two) hours as needed. HEART BURN      . Cholecalciferol (VITAMIN D) 1000 UNITS capsule Take 4,000 Units by mouth daily.     . citalopram (CELEXA) 10 MG tablet Take 1 tablet (10 mg total) by mouth daily. 90 tablet 3  . cyclobenzaprine (FLEXERIL) 10 MG tablet TAKE 1 TABLET BY MOUTH THREE TIMES DAILY AS NEEDED FOR MUSCLE SPASM 30 tablet 3  . DULoxetine (CYMBALTA) 60 MG capsule  Take 1 capsule (60 mg total) by mouth daily. 90 capsule 3  . gabapentin (NEURONTIN) 300 MG capsule Take 300 mg by mouth daily as needed. Nerve pain    . levothyroxine (SYNTHROID, LEVOTHROID) 100 MCG tablet Take 1 tablet (100 mcg total) by mouth daily. 90 tablet 3  . mirabegron ER (MYRBETRIQ) 25 MG TB24 tablet Take 25 mg by mouth daily.    . nabumetone (RELAFEN) 500 MG tablet Take 1 tablet (500 mg total) by mouth 2 (two) times daily as needed. 60 tablet 1  . omeprazole (PRILOSEC) 20 MG capsule Take 20 mg by mouth 2 (two) times daily before a meal. HEART BURN     . ondansetron (ZOFRAN) 4 MG tablet Take 1 tablet (4 mg total) by mouth every 8 (eight) hours as needed for nausea or vomiting. 20 tablet 0  . POTASSIUM PO Take 1 tablet by mouth daily.    Marland Kitchen REVLIMID 10 MG capsule Take 10 mg by mouth daily. 21 days on and 7 days off    . warfarin (COUMADIN) 5 MG tablet TAKE BY MOUTH AS DIRECTED BY ANTICOAGULATION CLINIC 30 tablet 3   No current facility-administered medications for this visit.     Allergies-reviewed and updated Allergies  Allergen Reactions  . Blood-Group Specific Substance     Must be Leukocyte Reduced and Irradiated due to stem cell transplant.     Social History   Social History Narrative   Married   Regular exercise-yes    Objective: BP 112/72 (BP Location: Left Arm, Patient Position: Sitting, Cuff Size: Large)   Pulse 78   Temp 98.8 F (37.1 C) (Oral)   Ht 5' 9.5" (1.765 m)   Wt 197 lb 6.4 oz (89.5 kg)   SpO2 95%   BMI 28.73 kg/m  Gen: NAD, resting comfortably HEENT: Mucous membranes are moist. Oropharynx normal- good dentition Neck: no thyromegaly, no cervical lymphadenopathy CV: RRR no murmurs rubs or gallops Lungs: CTAB no crackles, wheeze, rhonchi Abdomen: soft/nontender/nondistended/normal bowel sounds. No rebound or guarding. Overweight mildly Ext: no edema Skin: warm, dry Neuro: grossly normal, moves all extremities,  PERRLA  Assessment/Plan:  Hypothyroidism S: well controlled Lab Results  Component Value Date   TSH 3.25 09/24/2016   On thyroid medication-Levothyroxine 100 mcg A/P: continue current medications    Esophageal reflux S: compliant with omeprazole 16m BID. Very rarely needs a tums in addition to this- had to do some recently and hadnt been years otherwise A/P: would consider trying once a day dosing of omeprazole  Multiple myeloma in remission ([facility] Per notes from Dr. GAlvy Bimlerpatient is in remission. He is compliant with Revlimid. 21 days on, 7 days off[place] and per his report he is in a clinical trial  Pulmonary embolism ([place]) S: Has had 2 PEs so now on long term anticoagulation. One was after prostatectomy, one was after back surgery A/P: continue long term coumadin through our clinic with [facility]   Recurrent major depression in full remission ([place]) Major depression on both celexa and cymbalta. PHQ9 today of 0. Likely on cymbalta for chronic pain but with excellent conrol try to wean celexa 19m- take half tablet until follow up. He will come with his wife who apparently knows why he is on medicine better than he does- in regards to his celexa and cymbalta, potassium, gabapentin.    Future Appointments  Date Time Provider [place] 04/26/2017  1:45 PM [facility]  06/13/2017 10:15 AM HYong ChannelSBrayton Mars MD [facility]  follow up- bring wife with him (she is my patient)  Return precautions advised.  SGarret Reddish MD

## 2017-04-09 NOTE — Assessment & Plan Note (Signed)
S: well controlled Lab Results  Component Value Date   TSH 3.25 09/24/2016   On thyroid medication-Levothyroxine 100 mcg A/P: continue current medications

## 2017-04-10 ENCOUNTER — Encounter: Payer: Self-pay | Admitting: Hematology and Oncology

## 2017-04-10 LAB — MULTIPLE MYELOMA PANEL, SERUM
ALBUMIN/GLOB SERPL: 1.4 (ref 0.7–1.7)
ALPHA2 GLOB SERPL ELPH-MCNC: 0.7 g/dL (ref 0.4–1.0)
Albumin SerPl Elph-Mcnc: 3.9 g/dL (ref 2.9–4.4)
Alpha 1: 0.2 g/dL (ref 0.0–0.4)
B-Globulin SerPl Elph-Mcnc: 1 g/dL (ref 0.7–1.3)
GAMMA GLOB SERPL ELPH-MCNC: 1 g/dL (ref 0.4–1.8)
GLOBULIN, TOTAL: 2.9 g/dL (ref 2.2–3.9)
IGA: 422 mg/dL (ref 61–437)
IGG (IMMUNOGLOBIN G), SERUM: 1056 mg/dL (ref 700–1600)
IgM (Immunoglobulin M), Srm: 31 mg/dL (ref 15–143)
Total Protein ELP: 6.8 g/dL (ref 6.0–8.5)

## 2017-04-10 NOTE — Assessment & Plan Note (Signed)
According to the patient, he had labs drawn in [place] which showed complete remission He gets Revlimid through his oncologist in [place] He is comfortable to go to [place] every 3 months for follow-up He has not received Zometa since last year He had received numerous doses of Zometa I recommend DEXA scan before we proceed with further treatment In the meantime, he will continue with calcium and vitamin D If he does not need treatment with bisphosphonates, I see no reason to bring him back as he does not need 2 medical oncologists and he agreed

## 2017-04-10 NOTE — Progress Notes (Signed)
[facility] OFFICE PROGRESS NOTE  Patient Care Team: Marin Olp, MD as PCP - General (Family Medicine) Heath Lark, MD as Consulting Physician (Hematology and Oncology) Terrilyn Saver, MD as Consulting Physician (Oncology)  SUMMARY OF ONCOLOGIC HISTORY:  The patient was initially diagnosed in 2012. He had chemotherapy followed by autologous stem cell transplant [place] around October 2013. In April 2014 he was started on maintenance Revlimid 10 mg daily by mouth along with monthly Zometa. He receives his Revlimid through his physician from [place]. He is on warfarin therapy indefinitely, INR monitored by other physicians Starting on 05/18/2013, his Zometa is changed to every 3 months, then stopped in 2018 INTERVAL HISTORY: Please see below for problem oriented charting. He was referred back by his oncologist from [place] to be followed here closely The patient was surprised by the referral He is very comfortable and happy following up with his oncologist in [place] He continues to have intermittent bone pain and joint pain that is chronic He does not want to be on Zometa unless he have to due to intermittent flares of joint pain with each dose of Zometa He denies recent infection He was told he has remained in remission while his Revlimid  REVIEW OF SYSTEMS:   Constitutional: Denies fevers, chills or abnormal weight loss Eyes: Denies blurriness of vision Ears, nose, mouth, throat, and face: Denies mucositis or sore throat Respiratory: Denies cough, dyspnea or wheezes Cardiovascular: Denies palpitation, chest discomfort or lower extremity swelling Gastrointestinal:  Denies nausea, heartburn or change in bowel habits Skin: Denies abnormal skin rashes Lymphatics: Denies new lymphadenopathy or easy bruising Neurological:Denies numbness, tingling or new weaknesses Behavioral/Psych: Mood is stable, no new changes  All other  systems were reviewed with the patient and are negative.  I have reviewed the past medical history, past surgical history, social history and family history with the patient and they are unchanged from previous note.  ALLERGIES:  is allergic to blood-group specific substance.  MEDICATIONS:  Current Outpatient Medications  Medication Sig Dispense Refill  . calcium carbonate (TUMS - DOSED IN MG ELEMENTAL CALCIUM) 500 MG chewable tablet Chew 1 tablet by mouth every 2 (two) hours as needed. HEART BURN      . Cholecalciferol (VITAMIN D) 1000 UNITS capsule Take 4,000 Units by mouth daily.     . citalopram (CELEXA) 10 MG tablet Take 1 tablet (10 mg total) by mouth daily. 90 tablet 3  . cyclobenzaprine (FLEXERIL) 10 MG tablet TAKE 1 TABLET BY MOUTH THREE TIMES DAILY AS NEEDED FOR MUSCLE SPASM 30 tablet 3  . DULoxetine (CYMBALTA) 60 MG capsule Take 1 capsule (60 mg total) by mouth daily. 90 capsule 3  . gabapentin (NEURONTIN) 300 MG capsule Take 300 mg by mouth daily as needed. Nerve pain    . levothyroxine (SYNTHROID, LEVOTHROID) 100 MCG tablet Take 1 tablet (100 mcg total) by mouth daily. 90 tablet 3  . mirabegron ER (MYRBETRIQ) 25 MG TB24 tablet Take 25 mg by mouth daily.    . nabumetone (RELAFEN) 500 MG tablet Take 1 tablet (500 mg total) by mouth 2 (two) times daily as needed. 60 tablet 1  . omeprazole (PRILOSEC) 20 MG capsule Take 20 mg by mouth 2 (two) times daily before a meal. HEART BURN     . ondansetron (ZOFRAN) 4 MG tablet Take 1 tablet (4 mg total) by mouth every 8 (eight) hours as needed for nausea or vomiting. 20 tablet 0  . POTASSIUM  PO Take 1 tablet by mouth daily.    Marland Kitchen REVLIMID 10 MG capsule Take 10 mg by mouth daily. 21 days on and 7 days off    . warfarin (COUMADIN) 5 MG tablet TAKE BY MOUTH AS DIRECTED BY ANTICOAGULATION CLINIC 30 tablet 3   No current facility-administered medications for this visit.     PHYSICAL EXAMINATION: ECOG PERFORMANCE STATUS: 1 - Symptomatic but  completely ambulatory  Vitals:   04/08/17 1519  BP: 129/64  Pulse: 70  Resp: 18  Temp: 98.8 F (37.1 C)  SpO2: 97%   Filed Weights   04/08/17 1519  Weight: 198 lb 6.4 oz (90 kg)    GENERAL:alert, no distress and comfortable SKIN: skin color, texture, turgor are normal, no rashes or significant lesions EYES: normal, Conjunctiva are pink and non-injected, sclera clear OROPHARYNX:no exudate, no erythema and lips, buccal mucosa, and tongue normal  NECK: supple, thyroid normal size, non-tender, without nodularity LYMPH:  no palpable lymphadenopathy in the cervical, axillary or inguinal LUNGS: clear to auscultation and percussion with normal breathing effort HEART: regular rate & rhythm and no murmurs and no lower extremity edema ABDOMEN:abdomen soft, non-tender and normal bowel sounds Musculoskeletal:no cyanosis of digits and no clubbing  NEURO: alert & oriented x 3 with fluent speech, no focal motor/sensory deficits  LABORATORY DATA:  I have reviewed the data as listed    Component Value Date/Time   NA 139 04/08/2017 1418   NA 140 11/18/2013 0756   K 3.6 04/08/2017 1418   K 4.0 11/18/2013 0756   CL 110 (H) 04/08/2017 1418   CL 112 (H) 08/27/2012 1309   CO2 20 (L) 04/08/2017 1418   CO2 20 (L) 11/18/2013 0756   GLUCOSE 109 04/08/2017 1418   GLUCOSE 88 11/18/2013 0756   GLUCOSE 100 (H) 08/27/2012 1309   BUN 19 04/08/2017 1418   BUN 18.0 11/18/2013 0756   CREATININE 1.11 04/08/2017 1418   CREATININE 1.3 11/18/2013 0756   CALCIUM 8.5 04/08/2017 1418   CALCIUM 8.7 11/18/2013 0756   PROT 7.2 04/08/2017 1418   PROT 7.0 11/18/2013 0756   ALBUMIN 3.8 04/08/2017 1418   ALBUMIN 3.4 (L) 11/18/2013 0756   AST 23 04/08/2017 1418   AST 23 11/18/2013 0756   ALT 29 04/08/2017 1418   ALT 33 11/18/2013 0756   ALKPHOS 113 04/08/2017 1418   ALKPHOS 83 11/18/2013 0756   BILITOT 0.6 04/08/2017 1418   BILITOT 0.59 11/18/2013 0756   GFRNONAA >60 04/08/2017 1418   GFRAA >60 04/08/2017  1418    No results found for: SPEP, UPEP  Lab Results  Component Value Date   WBC 5.3 04/08/2017   NEUTROABS 3.4 04/08/2017   HGB 14.4 04/08/2017   HCT 41.9 04/08/2017   MCV 96.3 04/08/2017   PLT 168 04/08/2017      Chemistry      Component Value Date/Time   NA 139 04/08/2017 1418   NA 140 11/18/2013 0756   K 3.6 04/08/2017 1418   K 4.0 11/18/2013 0756   CL 110 (H) 04/08/2017 1418   CL 112 (H) 08/27/2012 1309   CO2 20 (L) 04/08/2017 1418   CO2 20 (L) 11/18/2013 0756   BUN 19 04/08/2017 1418   BUN 18.0 11/18/2013 0756   CREATININE 1.11 04/08/2017 1418   CREATININE 1.3 11/18/2013 0756      Component Value Date/Time   CALCIUM 8.5 04/08/2017 1418   CALCIUM 8.7 11/18/2013 0756   ALKPHOS 113 04/08/2017 1418   ALKPHOS 83  11/18/2013 0756   AST 23 04/08/2017 1418   AST 23 11/18/2013 0756   ALT 29 04/08/2017 1418   ALT 33 11/18/2013 0756   BILITOT 0.6 04/08/2017 1418   BILITOT 0.59 11/18/2013 0756       ASSESSMENT & PLAN:  Multiple myeloma in remission ([place])  According to the patient, he had labs drawn in [place] which showed complete remission He gets Revlimid through his oncologist in [place] He is comfortable to go to [place] every 3 months for follow-up He has not received Zometa since last year He had received numerous doses of Zometa I recommend DEXA scan before we proceed with further treatment In the meantime, he will continue with calcium and vitamin D If he does not need treatment with bisphosphonates, I see no reason to bring him back as he does not need 2 medical oncologists and he agreed  Back pain, lumbosacral He has intermittent, chronic pain in his back and joints. Recommend conservative management for now. He is on high-dose vitamin D and calcium. He complained of significant intermittent flare of bone pain after each dose of Zometa. As above, I plan to order bone density scan first. If he does not need treatment, I do not plan to prescribe  it If he does need bisphosphonate, I would consider Xgeva instead   Orders Placed This Encounter  Procedures  . DG Bone Density    Standing Status:   Future    Standing Expiration Date:   05/13/2018    Order Specific Question:   Reason for exam:    Answer:   osteopenia, multiple myeloma    Order Specific Question:   Preferred imaging location?    Answer:   [facility]   All questions were answered. The patient knows to call the clinic with any problems, questions or concerns. No barriers to learning was detected. I spent 15 minutes counseling the patient face to face. The total time spent in the appointment was 20 minutes and more than 50% was on counseling and review of test results     Heath Lark, MD 04/10/2017 7:34 AM

## 2017-04-10 NOTE — Assessment & Plan Note (Signed)
He has intermittent, chronic pain in his back and joints. Recommend conservative management for now. He is on high-dose vitamin D and calcium. He complained of significant intermittent flare of bone pain after each dose of Zometa. As above, I plan to order bone density scan first. If he does not need treatment, I do not plan to prescribe it If he does need bisphosphonate, I would consider Xgeva instead

## 2017-04-11 ENCOUNTER — Telehealth: Payer: Self-pay

## 2017-04-11 NOTE — Telephone Encounter (Signed)
Bone density scheduled for pt.  Spoke with pt by phone and gave him appt details.  Pt verbalizes understanding.

## 2017-04-16 ENCOUNTER — Ambulatory Visit
Admission: RE | Admit: 2017-04-16 | Discharge: 2017-04-16 | Disposition: A | Discharge status: Home / Self Care | Payer: Medicare Other | Source: Ambulatory Visit | Attending: Hematology and Oncology | Admitting: Hematology and Oncology

## 2017-04-16 DIAGNOSIS — Z13820 Encounter for screening for osteoporosis: Secondary | ICD-10-CM | POA: Diagnosis not present

## 2017-04-16 DIAGNOSIS — C9001 Multiple myeloma in remission: Secondary | ICD-10-CM

## 2017-04-17 ENCOUNTER — Telehealth: Payer: Self-pay | Admitting: *Deleted

## 2017-04-17 NOTE — Telephone Encounter (Signed)
Notified of message below. Verbalized understanding 

## 2017-04-17 NOTE — Telephone Encounter (Signed)
-----   Message from Heath Lark, MD sent at 04/16/2017  3:35 PM EST ----- Regarding: bone density Tell him his bone density is normal No need to return for zometa Continue calcium and D only ----- Message ----- From: Interface, Rad Results In Sent: 04/16/2017   3:11 PM To: Heath Lark, MD

## 2017-04-26 ENCOUNTER — Ambulatory Visit: Payer: Medicare Other

## 2017-05-04 ENCOUNTER — Other Ambulatory Visit: Payer: Self-pay | Admitting: Internal Medicine

## 2017-05-07 ENCOUNTER — Telehealth: Payer: Self-pay | Admitting: Family Medicine

## 2017-05-07 NOTE — Telephone Encounter (Signed)
Copied from [place] [id number]. Topic: Quick Communication - Rx Refill/Question >> May 07, 2017  4:39 PM Oliver Pila B wrote: Pharmacy called and states the medication below is on back order and needs an alternative medication, contact pharmacy or pt if needed  Medication: levothyroxine (SYNTHROID, LEVOTHROID) 100 MCG tablet [[id number]]   Has the patient contacted their pharmacy? Yes.     (Agent: If no, request that the patient contact the pharmacy for the refill.)   Preferred Pharmacy (with phone number or street name): [facility]    Agent: Please be advised that RX refills may take up to 3 business days. We ask that you follow-up with your pharmacy.

## 2017-05-08 NOTE — Telephone Encounter (Signed)
Please ask pharmacy what alternates they have? Do they have synthroid? We can send in same rx with synthroid instead of generic levothyroxine if needed.

## 2017-05-08 NOTE — Telephone Encounter (Signed)
See note

## 2017-05-09 DIAGNOSIS — C9000 Multiple myeloma not having achieved remission: Secondary | ICD-10-CM | POA: Diagnosis not present

## 2017-05-09 DIAGNOSIS — R6889 Other general symptoms and signs: Secondary | ICD-10-CM | POA: Diagnosis not present

## 2017-05-10 NOTE — Telephone Encounter (Signed)
Called and spoke to the pharmacist. He states they have the [facility] brand and wanted to know if they could switch him to this since it is a generic also. I provided the ok. I then called the patient and made him aware of the change. He verbalized understanding. He is aware that we will recheck his TSH level at his scheduled follow up appointment on 06/13/17.

## 2017-05-11 NOTE — Telephone Encounter (Signed)
great thanks! 

## 2017-05-15 ENCOUNTER — Ambulatory Visit (INDEPENDENT_AMBULATORY_CARE_PROVIDER_SITE_OTHER): Payer: Medicare Other

## 2017-05-15 ENCOUNTER — Ambulatory Visit (INDEPENDENT_AMBULATORY_CARE_PROVIDER_SITE_OTHER): Payer: Medicare Other | Admitting: Physician Assistant

## 2017-05-15 ENCOUNTER — Encounter: Payer: Self-pay | Admitting: Physician Assistant

## 2017-05-15 ENCOUNTER — Ambulatory Visit (INDEPENDENT_AMBULATORY_CARE_PROVIDER_SITE_OTHER): Payer: Medicare Other | Admitting: General Practice

## 2017-05-15 VITALS — BP 128/82 | HR 69 | Temp 97.4°F | Ht 69.5 in | Wt 202.2 lb

## 2017-05-15 DIAGNOSIS — S6992XA Unspecified injury of left wrist, hand and finger(s), initial encounter: Secondary | ICD-10-CM | POA: Diagnosis not present

## 2017-05-15 DIAGNOSIS — Z7901 Long term (current) use of anticoagulants: Secondary | ICD-10-CM

## 2017-05-15 DIAGNOSIS — M79642 Pain in left hand: Secondary | ICD-10-CM

## 2017-05-15 DIAGNOSIS — R0781 Pleurodynia: Secondary | ICD-10-CM

## 2017-05-15 DIAGNOSIS — S299XXA Unspecified injury of thorax, initial encounter: Secondary | ICD-10-CM | POA: Diagnosis not present

## 2017-05-15 LAB — POCT INR: INR: 4

## 2017-05-15 MED ORDER — HYDROCODONE-ACETAMINOPHEN 5-325 MG PO TABS: 1.0000 | ORAL_TABLET | Freq: Four times a day (QID) | ORAL | 0 refills | Status: DC | PRN

## 2017-05-15 NOTE — Patient Instructions (Addendum)
It was great to meet you!  We will call you when we get the official reading from your xray.  Rib Fracture A rib fracture is a break or crack in one of the bones of the ribs. The ribs are a group of long, curved bones that wrap around your chest and attach to your spine. They protect your lungs and other organs in the chest cavity. A broken or cracked rib is often painful, but most do not cause other problems. Most rib fractures heal on their own over time. However, rib fractures can be more serious if multiple ribs are broken or if broken ribs move out of place and push against other structures. What are the causes?  A direct blow to the chest. For example, this could happen during contact sports, a car accident, or a fall against a hard object.  Repetitive movements with high force, such as pitching a baseball or having severe coughing spells. What are the signs or symptoms?  Pain when you breathe in or cough.  Pain when someone presses on the injured area. How is this diagnosed? Your caregiver will perform a physical exam. Various imaging tests may be ordered to confirm the diagnosis and to look for related injuries. These tests may include a chest X-ray, computed tomography (CT), magnetic resonance imaging (MRI), or a bone scan. How is this treated? Rib fractures usually heal on their own in 1-3 months. The longer healing period is often associated with a continued cough or other aggravating activities. During the healing period, pain control is very important. Medication is usually given to control pain. Hospitalization or surgery may be needed for more severe injuries, such as those in which multiple ribs are broken or the ribs have moved out of place. Follow these instructions at home:  Avoid strenuous activity and any activities or movements that cause pain. Be careful during activities and avoid bumping the injured rib.  Gradually increase activity as directed by your  caregiver.  Only take over-the-counter or prescription medications as directed by your caregiver. Do not take other medications without asking your caregiver first.  Apply ice to the injured area for the first 1-2 days after you have been treated or as directed by your caregiver. Applying ice helps to reduce inflammation and pain. ? Put ice in a plastic bag. ? Place a towel between your skin and the bag. ? Leave the ice on for 15-20 minutes at a time, every 2 hours while you are awake.  Perform deep breathing as directed by your caregiver. This will help prevent pneumonia, which is a common complication of a broken rib. Your caregiver may instruct you to: ? Take deep breaths several times a day. ? Try to cough several times a day, holding a pillow against the injured area. ? Use a device called an incentive spirometer to practice deep breathing several times a day.  Drink enough fluids to keep your urine clear or pale yellow. This will help you avoid constipation.  Do not wear a rib belt or binder. These restrict breathing, which can lead to pneumonia. Get help right away if:  You have a fever.  You have difficulty breathing or shortness of breath.  You develop a continual cough, or you cough up thick or bloody sputum.  You feel sick to your stomach (nausea), throw up (vomit), or have abdominal pain.  You have worsening pain not controlled with medications. This information is not intended to replace advice given to you by  your health care provider. Make sure you discuss any questions you have with your health care provider. Document Released: 02/19/2005 Document Revised: 07/28/2015 Document Reviewed: 04/23/2012 Elsevier Interactive Patient Education  [employer].

## 2017-05-15 NOTE — Progress Notes (Signed)
Rodney [name] is a 77 y.o. male here for a new problem.  History of Present Illness:   Chief Complaint  Patient presents with  . Fall    3.13.19 around 7:45am...tripped on over the curve outside at the bus stop  . Left Rib pain    Hurts to walk and pain in his left rib area    HPI   Patient was saying goodbye to his grandkids at this bus stop this morning when he tripped on a curb outside around 7:45 this morning. He has significant history of multiple myeloma, currently undergoing treatment. He is also on Coumadin daily. He is currently complaining of left rib pain as well as left hand pain. Reports that his pain when he is sitting is a 3 out of 10. He does however have more significant pain when he goes to take a deep breath. He denies any sudden onset shortness of breath or chest pain. He denies any prior injury to ribs in the past. He is right-handed. He denies any significant bruising or open lacerations from this fall.   Past Medical History:  Diagnosis Date  . Anemia 02-16-11   01-05-11- post surgery-Transfusions x 4 units  . Bone pain 02/18/2013  . Complication of anesthesia 02-16-11   Pt. speaks of awakening during the surgery from back  surgery  . Degenerative disc disease 02-16-11   01-05-11 L3 fusion done  . Esophageal reflux 05/31/2008   Qualifier: Diagnosis of  By: Arnoldo Morale MD, Balinda Quails   . Hernia 02-16-11   left inguinal hernia at present  . Hypothyroidism 02/01/2014  . Multiple myeloma 02-16-11   suspected tumor  left sacral  . Pancreatitis   . Personal history of traumatic fracture 07/10/2012  . Prostate cancer ([place])   . Pulmonary embolism ([place])   . Second degree atrioventricular block 11/26/2012  . Shortness of breath 02-16-11   hx. Pulmonary emboli x2 (9 yrs/ 3'12 -last)     Social History   Socioeconomic History  . Marital status: Married    Spouse name: Not on file  . Number of children: Not on file  . Years of education: Not on file  . Highest  education level: Not on file  Social Needs  . Financial resource strain: Not on file  . Food insecurity - worry: Not on file  . Food insecurity - inability: Not on file  . Transportation needs - medical: Not on file  . Transportation needs - non-medical: Not on file  Occupational History  . Not on file  Tobacco Use  . Smoking status: Former Smoker    Last attempt to quit: 02/15/1969    Years since quitting: 48.2  . Smokeless tobacco: Never Used  . Tobacco comment: quit 50 yrs  Substance and Sexual Activity  . Alcohol use: No    Alcohol/week: 0.0 oz  . Drug use: No  . Sexual activity: Yes  Other Topics Concern  . Not on file  Social History Narrative   Married   Regular exercise-yes    Past Surgical History:  Procedure Laterality Date  . BACK SURGERY  02-16-11    01-05-11 Lumbar surgery L3(complicated by loss of blood volume)/ 01-09-11 then Lumbar fusion done with retained  hardware   . CHOLECYSTECTOMY  04/06/2010   laparoscopic-inflammation with stones  . HERNIA REPAIR  02/20/11   Inguinal hernia repair w/mesh  . INGUINAL HERNIA REPAIR  02/20/2011   Procedure: HERNIA REPAIR INGUINAL ADULT;  Surgeon: Earnstine Regal, MD;  Location: [facility];  Service: General;  Laterality: Left;  Repair Left Inguinal Hernia with Mesh  . PROSTATE SURGERY Bilateral 2011    Family History  Problem Relation Age of Onset  . Lymphoma Father 37  . Cancer Father   . Clotting disorder Mother        blood clots  . Cancer Brother        throat ca    Allergies  Allergen Reactions  . Blood-Group Specific Substance     Must be Leukocyte Reduced and Irradiated due to stem cell transplant.     Current Medications:   Current Outpatient Medications:  .  calcium carbonate (TUMS - DOSED IN MG ELEMENTAL CALCIUM) 500 MG chewable tablet, Chew 1 tablet by mouth every 2 (two) hours as needed. HEART BURN  , Disp: , Rfl:  .  Cholecalciferol (VITAMIN D) 1000 UNITS capsule, Take 4,000 Units by mouth daily. ,  Disp: , Rfl:  .  citalopram (CELEXA) 10 MG tablet, Take 1 tablet (10 mg total) by mouth daily., Disp: 90 tablet, Rfl: 3 .  cyclobenzaprine (FLEXERIL) 10 MG tablet, TAKE 1 TABLET BY MOUTH THREE TIMES DAILY AS NEEDED FOR MUSCLE SPASM, Disp: 30 tablet, Rfl: 3 .  DULoxetine (CYMBALTA) 60 MG capsule, Take 1 capsule (60 mg total) by mouth daily., Disp: 90 capsule, Rfl: 3 .  gabapentin (NEURONTIN) 300 MG capsule, Take 300 mg by mouth daily as needed. Nerve pain, Disp: , Rfl:  .  levothyroxine (SYNTHROID, LEVOTHROID) 100 MCG tablet, Take 1 tablet (100 mcg total) by mouth daily. **PT NEEDS PHYSICAL FOR ADDITIONAL REFILLS**, Disp: 90 tablet, Rfl: 0 .  mirabegron ER (MYRBETRIQ) 25 MG TB24 tablet, Take 25 mg by mouth daily., Disp: , Rfl:  .  nabumetone (RELAFEN) 500 MG tablet, Take 1 tablet (500 mg total) by mouth 2 (two) times daily as needed., Disp: 60 tablet, Rfl: 1 .  omeprazole (PRILOSEC) 20 MG capsule, Take 20 mg by mouth 2 (two) times daily before a meal. HEART BURN , Disp: , Rfl:  .  ondansetron (ZOFRAN) 4 MG tablet, Take 1 tablet (4 mg total) by mouth every 8 (eight) hours as needed for nausea or vomiting., Disp: 20 tablet, Rfl: 0 .  POTASSIUM PO, Take 1 tablet by mouth daily., Disp: , Rfl:  .  REVLIMID 10 MG capsule, Take 10 mg by mouth daily. 21 days on and 7 days off, Disp: , Rfl:  .  warfarin (COUMADIN) 5 MG tablet, TAKE BY MOUTH AS DIRECTED BY ANTICOAGULATION CLINIC, Disp: 30 tablet, Rfl: 3 .  HYDROcodone-acetaminophen (NORCO/VICODIN) 5-325 MG tablet, Take 1 tablet by mouth every 6 (six) hours as needed for moderate pain., Disp: 20 tablet, Rfl: 0   Review of Systems:   ROS  Negative unless otherwise specified per HPI.   Vitals:   Vitals:   05/15/17 0926  BP: 128/82  Pulse: 69  Temp: (!) 97.4 F (36.3 C)  TempSrc: Oral  SpO2: 97%  Weight: 202 lb 3.2 oz (91.7 kg)  Height: 5' 9.5" (1.765 m)     Body mass index is 29.43 kg/m.  Physical Exam:   Physical Exam  Constitutional: He  appears well-developed. He is cooperative.  Non-toxic appearance. He does not have a sickly appearance. He does not appear ill. No distress.  Cardiovascular: Normal rate, regular rhythm, S1 normal, S2 normal, normal heart sounds and normal pulses.  No LE edema  Pulmonary/Chest: Effort normal and breath sounds normal.  Musculoskeletal:  Significant tenderness to left lower  lateral rib. Small step-off deformity palpated. No ecchymosis or redness on exam. No open areas.  Tenderness with palpation to orders and fifth MCP joints on left hand. No decrease in range of motion.   Neurological: He is alert. GCS eye subscore is 4. GCS verbal subscore is 5. GCS motor subscore is 6.  Bilateral grip strength 5 out of 5. Normal sensation throughout left hand.  Skin: Skin is warm, dry and intact.  Psychiatric: He has a normal mood and affect. His speech is normal and behavior is normal.  Nursing note and vitals reviewed.  CLINICAL DATA:  Left anterior rib pain after fall.  EXAM: LEFT RIBS AND CHEST - 3+ VIEW  COMPARISON:  Chest x-ray dated September 24, 2016.  FINDINGS: No acute fracture or other bone lesions are seen involving the ribs. Old fracture of the left lateral seventh rib. There is no evidence of pneumothorax or pleural effusion. Both lungs are clear. Heart size and mediastinal contours are within normal limits.  IMPRESSION: Negative.   Electronically Signed   By: Titus Dubin M.D.   On: 05/15/2017 13:40  CLINICAL DATA:  Fall.  Pain.  Multiple myeloma history  EXAM: LEFT HAND - COMPLETE 3+ VIEW  COMPARISON:  None.  FINDINGS: Negative for fracture  Small lytic lesions in the navicular bone. No other lytic lesions identified. No significant arthropathy.  IMPRESSION: Negative for fracture  Small lytic bones in the navicular. While myeloma is theoretically possible this would be an unusual location. Possibly subchondral cysts.   Electronically Signed   By:  Franchot Gallo M.D.   On: 05/15/2017 14:12  Assessment and Plan:    Broox was seen today for fall and left rib pain.  Diagnoses and all orders for this visit:  Rib pain on left side Review patient in office with Dr. Briscoe Deutscher, who also examined patient. No red flags on exam. Good breath sounds bilaterally. No significant bruising on exam. Continue to work on deep breaths as able, and take Norco as needed for severe pain. Patient verbalized understanding. Follow-up symptoms worsen or persist. Reviewed red flags including significant bruising and increased sudden onset shortness of breath with patient. -     DG Ribs Unilateral W/Chest Left; Future  Left hand pain I reviewed these x-rays with Dr. Teresa Coombs in our office today. Likely arthritic changes, however radiology commented on possible unusual findings in hand x-ray. We'll also forward this note with the xray results to patient's oncologist. Follow-up symptoms worsen or persist. -     DG Hand Complete Left; Future  Other orders -     Discontinue: HYDROcodone-acetaminophen (NORCO/VICODIN) 5-325 MG tablet; Take 1 tablet by mouth every 6 (six) hours as needed for moderate pain. -     HYDROcodone-acetaminophen (NORCO/VICODIN) 5-325 MG tablet; Take 1 tablet by mouth every 6 (six) hours as needed for moderate pain.    . Reviewed expectations re: course of current medical issues. . Discussed self-management of symptoms. . Outlined signs and symptoms indicating need for more acute intervention. . Patient verbalized understanding and all questions were answered. . See orders for this visit as documented in the electronic medical record. . Patient received an After-Visit Summary.   Inda Coke, PA-C

## 2017-05-15 NOTE — Patient Instructions (Addendum)
Pre visit review using our clinic review tool, if applicable. No additional management support is needed unless otherwise documented below in the visit note.  Skip dose today (3/13) and take 1/2 tablet tomorrow (3/14) and then continue to take 1 tablet daily except 1/2 tablet on Wednesdays and Saturdays.  Re-check in 2 weeks.

## 2017-05-15 NOTE — Progress Notes (Signed)
I have reviewed this visit and I agree on the patient's plan of dosage and recommendations. Stormie Ventola, DO   

## 2017-05-16 ENCOUNTER — Encounter: Payer: Self-pay | Admitting: Physician Assistant

## 2017-05-17 ENCOUNTER — Ambulatory Visit (INDEPENDENT_AMBULATORY_CARE_PROVIDER_SITE_OTHER): Payer: Medicare Other | Admitting: Sports Medicine

## 2017-05-17 ENCOUNTER — Encounter: Payer: Self-pay | Admitting: Sports Medicine

## 2017-05-17 ENCOUNTER — Encounter: Payer: Self-pay | Admitting: Physician Assistant

## 2017-05-17 ENCOUNTER — Ambulatory Visit: Payer: Self-pay

## 2017-05-17 ENCOUNTER — Ambulatory Visit (INDEPENDENT_AMBULATORY_CARE_PROVIDER_SITE_OTHER): Payer: Medicare Other | Admitting: Physician Assistant

## 2017-05-17 VITALS — BP 130/78 | HR 61 | Ht 69.5 in | Wt 202.0 lb

## 2017-05-17 VITALS — BP 130/78 | HR 61 | Temp 97.9°F | Ht 69.5 in | Wt 202.0 lb

## 2017-05-17 DIAGNOSIS — R791 Abnormal coagulation profile: Secondary | ICD-10-CM | POA: Diagnosis not present

## 2017-05-17 DIAGNOSIS — M79642 Pain in left hand: Secondary | ICD-10-CM

## 2017-05-17 DIAGNOSIS — Z7901 Long term (current) use of anticoagulants: Secondary | ICD-10-CM | POA: Diagnosis not present

## 2017-05-17 DIAGNOSIS — C9001 Multiple myeloma in remission: Secondary | ICD-10-CM | POA: Diagnosis not present

## 2017-05-17 DIAGNOSIS — G5603 Carpal tunnel syndrome, bilateral upper limbs: Secondary | ICD-10-CM | POA: Diagnosis not present

## 2017-05-17 DIAGNOSIS — M1812 Unilateral primary osteoarthritis of first carpometacarpal joint, left hand: Secondary | ICD-10-CM

## 2017-05-17 NOTE — Progress Notes (Signed)
  Rodney [name] - 77 y.o. male MRN [id number]  Date of birth: [birth year]  Visit Date: 05/17/2017  PCP: Marin Olp, MD   Referred by: Marin Olp, MD   Scribe for today's visit: Wendy Poet, LAT, ATC     SUBJECTIVE:  Rodney [name] is here for New Patient (Initial Visit) (L hand pain and swelling) .  Referred by: Len Blalock, PA His L hand pain and swelling symptoms INITIALLY: Began on May 15, 2017 when he tripped and fell and hit his L hand and broke his L rib. Described as 0/10 pain at rest and 4/10 sharp pain at it's worst , nonradiating Worsened with gripping, pushing through L hand, weight-bearing through L hand Improved with nothing noted Additional associated symptoms include: N/T into the L hand    At this time symptoms show no change compared to onset  He has been using ice w/ no relief.   ROS Denies night time disturbances. Denies fevers, chills, or night sweats. Denies unexplained weight loss. Reports personal history of cancer. Denies changes in bowel or bladder habits. Reports recent unreported falls. Denies new or worsening dyspnea or wheezing. Denies headaches or dizziness.  Reports numbness, tingling or weakness  In the extremities.  Denies dizziness or presyncopal episodes Denies lower extremity edema      Please see additional documentation for Objective, Assessment and Plan sections. Pertinent additional documentation may be included in corresponding procedure notes, imaging studies, problem based documentation and patient instructions. Please see these sections of the encounter for additional information regarding this visit.  CMA/ATC served as Education administrator during this visit. History, Physical, and Plan performed by medical provider. Documentation and orders reviewed and attested to.      Gerda Diss, [place] [facility] Physician

## 2017-05-17 NOTE — Progress Notes (Signed)
   [name], [place] at [facility] [phone]  SHERROD TOOTHMAN - 77 y.o. male MRN [id number]  Date of birth: Jul 04, 1940  Visit Date: 05/17/2017  PCP: Marin Olp, MD   Referred by: Marin Olp, MD  Please see additional documentation for HPI, review of systems.  HISTORY & PERTINENT PRIOR DATA:  Prior History reviewed and updated per electronic medical record.  Significant history, findings, studies and interim changes include:  reports that he quit smoking about 48 years ago. he has never used smokeless tobacco. No results for input(s): HGBA1C, LABURIC, CREATINE in the last 8760 hours. No specialty comments available. Problem  Carpal Tunnel Syndrome On Both Sides   Deals with pain and numbness and tingling.  Median nerve is enlarged left greater than right consistent with moderate CTS on the left and mild to moderate on the right.  He does have some CMC arthritis as well especially in the left hand greater than right.     OBJECTIVE:  VS:  HT:5' 9.5" (176.5 cm)   WT:202 lb (91.6 kg)  BMI:29.41    BP:130/78  HR:61bpm  TEMP: ( )  RESP:96 %   PHYSICAL EXAM: Constitutional: WDWN, Non-toxic appearing. Psychiatric: Alert & appropriately interactive.  Not depressed or anxious appearing. Respiratory: No increased work of breathing.  Trachea Midline Eyes: Pupils are equal.  EOM intact without nystagmus.  No scleral icterus  NEUROVASCULAR exam: No clubbing or cyanosis appreciated No significant venous stasis changes Capillary Refill: normal, less than 2 seconds   Left hand  Moderate Swelling Generalized darkening but no overt ecchymosis Tenderness: TTP over the 4th metacarpal and CMC joint. No snuff box tenderness   RANGE OF MOTION & STRENGTH  Normal   LIGAMENTOUS TESTING  Normal   SPECIALITY TESTING:  Mild pain with axial load and circumduction of cmc joint but mild.  Minimal symptoms with Carpal  Tunnel compression test and Phalens    ASSESSMENT & PLAN:   1. Left hand pain   2. Supratherapeutic INR   3. Multiple myeloma in remission ([place])   4. Primary osteoarthritis of first carpometacarpal joint of left hand   5. Long term (current) use of anticoagulants   6. Carpal tunnel syndrome on both sides    Orders & Meds:  Orders Placed This Encounter  Procedures  . [place] MSK POCT ULTRASOUND   No orders of the defined types were placed in this encounter.   PLAN:  Compression, Ice and elevation Provided ACE wrap today Use home wrist brace   Can consider carpal tunnel injections if worsening symptoms.   Discussed duration of pain/swelling is likely going to be 4-6 weeks   Supra-therapeutic INR contributing.  Being followed No problem-specific Assessment & Plan notes found for this encounter.   Follow-up: Return if symptoms worsen or fail to improve.

## 2017-05-17 NOTE — Progress Notes (Deleted)
Rodney [name] is a 77 y.o. male here for a follow up of a pre-existing problem.  I acted as a Education administrator for [employer], PA-C Anselmo Pickler, LPN  History of Present Illness:   Chief Complaint  Patient presents with  . Left hand pain and edema    Hand Pain   Incident onset: Pt fell two days ago and injured left hand now c/o swelling since yesterday. Incident location: fell outside. The injury mechanism was a fall. The pain is present in the left hand. The quality of the pain is described as aching. The pain does not radiate. The pain is at a severity of 4/10. The pain is moderate. The pain has been fluctuating since the incident. Associated symptoms include numbness and tingling. Associated symptoms comments: Edema left hand started yesterday.. The symptoms are aggravated by movement. He has tried ice for the symptoms. The treatment provided no relief.    Past Medical History:  Diagnosis Date  . Anemia 02-16-11   01-05-11- post surgery-Transfusions x 4 units  . Bone pain 02/18/2013  . Complication of anesthesia 02-16-11   Pt. speaks of awakening during the surgery from back  surgery  . Degenerative disc disease 02-16-11   01-05-11 L3 fusion done  . Esophageal reflux 05/31/2008   Qualifier: Diagnosis of  By: Arnoldo Morale MD, Balinda Quails   . Hernia 02-16-11   left inguinal hernia at present  . Hypothyroidism 02/01/2014  . Multiple myeloma 02-16-11   suspected tumor  left sacral  . Pancreatitis   . Personal history of traumatic fracture 07/10/2012  . Prostate cancer ([place])   . Pulmonary embolism ([place])   . Second degree atrioventricular block 11/26/2012  . Shortness of breath 02-16-11   hx. Pulmonary emboli x2 (9 yrs/ 3'12 -last)     Social History   Socioeconomic History  . Marital status: Married    Spouse name: Not on file  . Number of children: Not on file  . Years of education: Not on file  . Highest education level: Not on file  Social Needs  . Financial resource strain: Not  on file  . Food insecurity - worry: Not on file  . Food insecurity - inability: Not on file  . Transportation needs - medical: Not on file  . Transportation needs - non-medical: Not on file  Occupational History  . Not on file  Tobacco Use  . Smoking status: Former Smoker    Last attempt to quit: 02/15/1969    Years since quitting: 48.2  . Smokeless tobacco: Never Used  . Tobacco comment: quit 50 yrs  Substance and Sexual Activity  . Alcohol use: No    Alcohol/week: 0.0 oz  . Drug use: No  . Sexual activity: Yes  Other Topics Concern  . Not on file  Social History Narrative   Married   Regular exercise-yes    Past Surgical History:  Procedure Laterality Date  . BACK SURGERY  02-16-11    01-05-11 Lumbar surgery L3(complicated by loss of blood volume)/ 01-09-11 then Lumbar fusion done with retained  hardware   . CHOLECYSTECTOMY  04/06/2010   laparoscopic-inflammation with stones  . HERNIA REPAIR  02/20/11   Inguinal hernia repair w/mesh  . INGUINAL HERNIA REPAIR  02/20/2011   Procedure: HERNIA REPAIR INGUINAL ADULT;  Surgeon: Earnstine Regal, MD;  Location: [facility];  Service: General;  Laterality: Left;  Repair Left Inguinal Hernia with Mesh  . PROSTATE SURGERY Bilateral 2011    Family History  Problem Relation Age of Onset  . Lymphoma Father 2  . Cancer Father   . Clotting disorder Mother        blood clots  . Cancer Brother        throat ca    Allergies  Allergen Reactions  . Blood-Group Specific Substance     Must be Leukocyte Reduced and Irradiated due to stem cell transplant.     Current Medications:   Current Outpatient Medications:  .  calcium carbonate (TUMS - DOSED IN MG ELEMENTAL CALCIUM) 500 MG chewable tablet, Chew 1 tablet by mouth every 2 (two) hours as needed. HEART BURN  , Disp: , Rfl:  .  Cholecalciferol (VITAMIN D) 1000 UNITS capsule, Take 4,000 Units by mouth daily. , Disp: , Rfl:  .  citalopram (CELEXA) 10 MG tablet, Take 1 tablet (10 mg total)  by mouth daily., Disp: 90 tablet, Rfl: 3 .  cyclobenzaprine (FLEXERIL) 10 MG tablet, TAKE 1 TABLET BY MOUTH THREE TIMES DAILY AS NEEDED FOR MUSCLE SPASM, Disp: 30 tablet, Rfl: 3 .  DULoxetine (CYMBALTA) 60 MG capsule, Take 1 capsule (60 mg total) by mouth daily., Disp: 90 capsule, Rfl: 3 .  gabapentin (NEURONTIN) 300 MG capsule, Take 300 mg by mouth daily as needed. Nerve pain, Disp: , Rfl:  .  HYDROcodone-acetaminophen (NORCO/VICODIN) 5-325 MG tablet, Take 1 tablet by mouth every 6 (six) hours as needed for moderate pain., Disp: 20 tablet, Rfl: 0 .  levothyroxine (SYNTHROID, LEVOTHROID) 100 MCG tablet, Take 1 tablet (100 mcg total) by mouth daily. **PT NEEDS PHYSICAL FOR ADDITIONAL REFILLS**, Disp: 90 tablet, Rfl: 0 .  mirabegron ER (MYRBETRIQ) 25 MG TB24 tablet, Take 25 mg by mouth daily., Disp: , Rfl:  .  nabumetone (RELAFEN) 500 MG tablet, Take 1 tablet (500 mg total) by mouth 2 (two) times daily as needed., Disp: 60 tablet, Rfl: 1 .  omeprazole (PRILOSEC) 20 MG capsule, Take 20 mg by mouth 2 (two) times daily before a meal. HEART BURN , Disp: , Rfl:  .  ondansetron (ZOFRAN) 4 MG tablet, Take 1 tablet (4 mg total) by mouth every 8 (eight) hours as needed for nausea or vomiting., Disp: 20 tablet, Rfl: 0 .  POTASSIUM PO, Take 1 tablet by mouth daily., Disp: , Rfl:  .  REVLIMID 10 MG capsule, Take 10 mg by mouth daily. 21 days on and 7 days off, Disp: , Rfl:  .  warfarin (COUMADIN) 5 MG tablet, TAKE BY MOUTH AS DIRECTED BY ANTICOAGULATION CLINIC, Disp: 30 tablet, Rfl: 3   Review of Systems:   Review of Systems  Neurological: Positive for tingling and numbness.    Vitals:   Vitals:   05/17/17 0802  BP: 130/78  Pulse: 61  Temp: 97.9 F (36.6 C)  TempSrc: Oral  SpO2: 96%  Weight: 202 lb (91.6 kg)  Height: 5' 9.5" (1.765 m)     Body mass index is 29.4 kg/m.  Physical Exam:   Physical Exam  Assessment and Plan:    There are no diagnoses linked to this encounter.  . Reviewed  expectations re: course of current medical issues. . Discussed self-management of symptoms. . Outlined signs and symptoms indicating need for more acute intervention. . Patient verbalized understanding and all questions were answered. . See orders for this visit as documented in the electronic medical record. . Patient received an After-Visit Summary.  CMA or LPN served as scribe during this visit. History, Physical, and Plan performed by medical provider. Documentation and  orders reviewed and attested to.  Inda Coke, PA-C

## 2017-05-17 NOTE — Procedures (Signed)
LIMITED MSK ULTRASOUND OF Bilateral wrists Images were obtained and interpreted by myself, Teresa Coombs, DO  Images have been saved and stored to PACS system. Images obtained on: GE S7 Ultrasound machine  FINDINGS:   4th metacarpal with overlying soft tissue swelling hypoechoic change just overlying the bone with increased neovascularity Left median nerve = 0.19cm2 right median nerve = 0.14cm2 Left CMC with osteophytic spurs and slight erosion of the scaphoid without increased neovascularity  IMPRESSION:  1. 1. Left 4th Metacarpal bone contusion with overlying soft tissue hematoma 2. Bilateral Carpal Tunnel Syndrome Left > Right 3. Left CMC arthritis with bony cysts consistent with subchondral cysts

## 2017-05-20 NOTE — Progress Notes (Signed)
Error -- I did not see patient, he was transferred to Dr. Nicolasa Ducking schedule and seen.

## 2017-06-05 ENCOUNTER — Ambulatory Visit (INDEPENDENT_AMBULATORY_CARE_PROVIDER_SITE_OTHER): Payer: Medicare Other | Admitting: General Practice

## 2017-06-05 DIAGNOSIS — Z7901 Long term (current) use of anticoagulants: Secondary | ICD-10-CM

## 2017-06-05 LAB — POCT INR: INR: 2.2

## 2017-06-05 NOTE — Patient Instructions (Addendum)
Pre visit review using our clinic review tool, if applicable. No additional management support is needed unless otherwise documented below in the visit note.  Continue to take 1 tablet daily except 1/2 tablet on Wednesdays and Saturdays.  Re-check in 4 weeks.    

## 2017-06-05 NOTE — Progress Notes (Signed)
I have reviewed and agree with note, evaluation, plan.   Antwone Capozzoli, MD  

## 2017-06-13 ENCOUNTER — Encounter: Payer: Self-pay | Admitting: Family Medicine

## 2017-06-13 ENCOUNTER — Ambulatory Visit (INDEPENDENT_AMBULATORY_CARE_PROVIDER_SITE_OTHER): Payer: Medicare Other | Admitting: Family Medicine

## 2017-06-13 VITALS — BP 128/78 | HR 68 | Temp 97.7°F | Ht 69.5 in | Wt 202.6 lb

## 2017-06-13 DIAGNOSIS — F3342 Major depressive disorder, recurrent, in full remission: Secondary | ICD-10-CM

## 2017-06-13 DIAGNOSIS — M545 Low back pain, unspecified: Secondary | ICD-10-CM

## 2017-06-13 DIAGNOSIS — Z1211 Encounter for screening for malignant neoplasm of colon: Secondary | ICD-10-CM

## 2017-06-13 DIAGNOSIS — K219 Gastro-esophageal reflux disease without esophagitis: Secondary | ICD-10-CM

## 2017-06-13 DIAGNOSIS — L608 Other nail disorders: Secondary | ICD-10-CM

## 2017-06-13 DIAGNOSIS — J309 Allergic rhinitis, unspecified: Secondary | ICD-10-CM

## 2017-06-13 DIAGNOSIS — E559 Vitamin D deficiency, unspecified: Secondary | ICD-10-CM

## 2017-06-13 MED ORDER — CITALOPRAM HYDROBROMIDE 10 MG PO TABS: 10.0000 mg | ORAL_TABLET | Freq: Every day | ORAL | 3 refills | Status: DC

## 2017-06-13 MED ORDER — OMEPRAZOLE 20 MG PO CPDR
20.0000 mg | DELAYED_RELEASE_CAPSULE | Freq: Every day | ORAL | 3 refills | Status: DC
Start: 2017-06-13 — End: 2017-12-25

## 2017-06-13 NOTE — Assessment & Plan Note (Signed)
S: last visit we discussed relafen not ideal with coumadin (we removed from list today ). flexeril is ok with coumadin and that tylenol is low risk for his pains. He uses flexeril each night.   On gabapentin 1200mg  each evening though not clear who prescribes- really seems to help with pain going into his legs.  A/P: continue current medicines other than stopping nsaids

## 2017-06-13 NOTE — Assessment & Plan Note (Signed)
S: last visit plan was to go down to celexa half tablet given PHQ9 of 0. Instead, apparently patient came Off cymbalta 60mg  because he "didn't feel well" with it- not sure on specific issues with medicine. PHQ9 remains at 0 and back pain hasnt increased. A/P: continue off cymbalta since patient is doing well. Continue citalopram 10mg 

## 2017-06-13 NOTE — Assessment & Plan Note (Addendum)
Omeprazole 20mg  twice a day for years due to ulcer history. I advised trial ofonce a day in AM. Need to reduce for QT prolongation risk anyway.  On long term PPI but b12 checked within a year and on b12 supplement 1000 mcg

## 2017-06-13 NOTE — Assessment & Plan Note (Signed)
S:Cough since December at least . No allergy medicine. Sneezing. No fevers or chills. No shortness of breath. Clear and sticky. Does seem to be worse at work at [employer]: see avs instructions  "FOr your cough, congestion, sneezing- Please trial one of these at night- cetirizine/zyrtec or xyzal or allegra or claritin- can use [facility] or other store brand. If no improvement in 2 weeks- lets try 2 weeks of flonase- if no better at that point (1 month from now) please see [place] back. "

## 2017-06-13 NOTE — Patient Instructions (Addendum)
Health Maintenance Due  Topic Date Due  . COLONOSCOPY- Referral to [facility]. We will call you within a week or two about your referral. If you do not hear within 3 weeks, give [place] a call.   09/[birth year]/1992   FOr your cough, congestion, sneezing- Please trial one of these at night- cetirizine/zyrtec or xyzal or allegra or claritin- can use [facility] or other store brand. If no improvement in 2 weeks- lets try 2 weeks of flonase- if no better at that point (1 month from now) please see [place] back.   Reduce vitamin D to 4000 units a day plus what is in your calcium supplement  I looked back and your b12 and vitamin D have bene checked last year- I think we are ok holding off on labs

## 2017-06-13 NOTE — Progress Notes (Signed)
Subjective:  Rodney [name] is a 77 y.o. year old very pleasant male patient who presents for/with See problem oriented charting ROS- some cough and congestion. No fever or chills. No shortness of breath.    Past Medical History-  Patient Active Problem List   Diagnosis Date Noted  . Multiple myeloma in remission ([place]) 07/10/2012    Priority: High  . PROSTATE CANCER, HX OF 01/05/2008    Priority: High  . Pulmonary embolism ([place]) 01/05/2008    Priority: High  . Overactive bladder 04/09/2017    Priority: Medium  . History of skin cancer 04/09/2017    Priority: Medium  . Recurrent major depression in full remission ([place]) 10/02/2016    Priority: Medium  . Bilateral hip pain 04/06/2016    Priority: Medium  . Hypothyroidism 02/01/2014    Priority: Medium  . Back pain, lumbosacral 02/11/2012    Priority: Medium  . Esophageal reflux 05/31/2008    Priority: Medium  . Vitamin D deficiency 04/09/2017    Priority: Low  . Back pain, thoracic 12/19/2016    Priority: Low  . Long term (current) use of anticoagulants 12/14/2016    Priority: Low  . Lump of skin 04/23/2016    Priority: Low  . Carpal tunnel syndrome on both sides 01/24/2016    Priority: Low  . Right foot pain 10/05/2015    Priority: Low  . Tendonitis of ankle, left 10/03/2015    Priority: Low  . Allergic rhinitis 02/01/2014    Priority: Low  . Bone pain 02/18/2013    Priority: Low  . History of duodenal ulcer 01/05/2008    Priority: Low    Medications- reviewed and updated Current Outpatient Medications  Medication Sig Dispense Refill  . calcium carbonate (TUMS - DOSED IN MG ELEMENTAL CALCIUM) 500 MG chewable tablet Chew 1 tablet by mouth every 2 (two) hours as needed. HEART BURN      . Cholecalciferol (VITAMIN D) 1000 UNITS capsule Take 4,000 Units by mouth daily.     . citalopram (CELEXA) 10 MG tablet Take 1 tablet (10 mg total) by mouth daily. 90 tablet 3  . cyclobenzaprine (FLEXERIL) 10 MG tablet TAKE 1  TABLET BY MOUTH THREE TIMES DAILY AS NEEDED FOR MUSCLE SPASM 30 tablet 3  . gabapentin (NEURONTIN) 300 MG capsule Take 300 mg by mouth daily as needed. Nerve pain    . levothyroxine (SYNTHROID, LEVOTHROID) 100 MCG tablet Take 1 tablet (100 mcg total) by mouth daily. **PT NEEDS PHYSICAL FOR ADDITIONAL REFILLS** 90 tablet 0  . mirabegron ER (MYRBETRIQ) 25 MG TB24 tablet Take 25 mg by mouth daily.    Marland Kitchen omeprazole (PRILOSEC) 20 MG capsule Take 1 capsule (20 mg total) by mouth daily before breakfast. HEART BURN 90 capsule 3  . POTASSIUM PO Take 1 tablet by mouth daily.    Marland Kitchen REVLIMID 10 MG capsule Take 10 mg by mouth daily. 21 days on and 7 days off    . warfarin (COUMADIN) 5 MG tablet TAKE BY MOUTH AS DIRECTED BY ANTICOAGULATION CLINIC 30 tablet 3   No current facility-administered medications for this visit.     Objective: BP 128/78 (BP Location: Left Arm, Patient Position: Sitting, Cuff Size: Normal)   Pulse 68   Temp 97.7 F (36.5 C) (Oral)   Ht 5' 9.5" (1.765 m)   Wt 202 lb 9.6 oz (91.9 kg)   SpO2 96%   BMI 29.49 kg/m  Gen: NAD, resting comfortably Rhinorrhea noted CV: RRR no murmurs rubs or  gallops Lungs: CTAB no crackles, wheeze, rhonchi Ext: no edema Skin: warm, dry, partial toenails noted great big toes, thickening of other toenails noted  Assessment/Plan:  Other issues: 1. Refer for colonoscopy today.  2. No longer using narcotics after hard fall onto ribs 3. Long thick toenails- wants to consider removal- asks for podiatry. Not sure if best lamisil candidate given multiple myeloma meds- didn't use interaction checker as he preferred to see podiatry. He apparently has removed at least one of his naisl on his own.   Vitamin D deficiency S: vitamin D- taking 8000 units a day plus calcium vitamin D (800 units)  A/P: levels have been ok but with 8800 a day- asked him to reduce to 4800 per day and we can recheck with next labs  Esophageal reflux Omeprazole 51m twice a day for  years due to ulcer history. I advised trial ofonce a day in AM. Need to reduce for QT prolongation risk anyway.  On long term PPI but b12 checked within a year and on b12 supplement 1000 mcg   Recurrent major depression in full remission ([place] S: last visit plan was to go down to celexa half tablet given PHQ9 of 0. Instead, apparently patient came Off cymbalta 636mbecause he "didn't feel well" with it- not sure on specific issues with medicine. PHQ9 remains at 0 and back pain hasnt increased. A/P: continue off cymbalta since patient is doing well. Continue citalopram 1031mBack pain, lumbosacral S: last visit we discussed relafen not ideal with coumadin (we removed from list today ). flexeril is ok with coumadin and that tylenol is low risk for his pains. He uses flexeril each night.   On gabapentin 1200m59mch evening though not clear who prescribes- really seems to help with pain going into his legs.  A/P: continue current medicines other than stopping nsaids  Allergic rhinitis S:Cough since December at least . No allergy medicine. Sneezing. No fevers or chills. No shortness of breath. Clear and sticky. Does seem to be worse at work at [employer]: see avs instructions  "FOr your cough, congestion, sneezing- Please trial one of these at night- cetirizine/zyrtec or xyzal or allegra or claritin- can use [facility] or other store brand. If no improvement in 2 weeks- lets try 2 weeks of flonase- if no better at that point (1 month from now) please see us [place]. "   Future Appointments  Date Time Provider DepaOsakis8/2019 10:30 AM [facility]   6-12 month follow up reasonable though not specifically discussed  Lab/Order associations: Colon cancer screening - Plan: Ambulatory referral to Gastroenterology  Toenail deformity - Plan: Ambulatory referral to Podiatry  Meds ordered this encounter  Medications  . citalopram (CELEXA) 10 MG tablet    Sig: Take 1  tablet (10 mg total) by mouth daily.    Dispense:  90 tablet    Refill:  3  . omeprazole (PRILOSEC) 20 MG capsule    Sig: Take 1 capsule (20 mg total) by mouth daily before breakfast. HEART BURN    Dispense:  90 capsule    Refill:  3    Return precautions advised.  StepGarret Reddish

## 2017-06-13 NOTE — Assessment & Plan Note (Signed)
S: vitamin D- taking 8000 units a day plus calcium vitamin D (800 units)  A/P: levels have been ok but with 8800 a day- asked him to reduce to 4800 per day and we can recheck with next labs

## 2017-06-28 ENCOUNTER — Encounter: Payer: Self-pay | Admitting: Gastroenterology

## 2017-06-29 ENCOUNTER — Other Ambulatory Visit: Payer: Self-pay | Admitting: Internal Medicine

## 2017-06-29 DIAGNOSIS — M545 Low back pain, unspecified: Secondary | ICD-10-CM

## 2017-06-29 DIAGNOSIS — C9000 Multiple myeloma not having achieved remission: Secondary | ICD-10-CM

## 2017-07-01 ENCOUNTER — Ambulatory Visit: Payer: Self-pay

## 2017-07-01 ENCOUNTER — Ambulatory Visit (INDEPENDENT_AMBULATORY_CARE_PROVIDER_SITE_OTHER): Payer: Medicare Other

## 2017-07-01 ENCOUNTER — Encounter: Payer: Self-pay | Admitting: Sports Medicine

## 2017-07-01 ENCOUNTER — Ambulatory Visit (INDEPENDENT_AMBULATORY_CARE_PROVIDER_SITE_OTHER): Payer: Medicare Other | Admitting: Sports Medicine

## 2017-07-01 VITALS — BP 104/70 | HR 65 | Ht 69.5 in | Wt 202.2 lb

## 2017-07-01 DIAGNOSIS — M160 Bilateral primary osteoarthritis of hip: Secondary | ICD-10-CM

## 2017-07-01 DIAGNOSIS — M25551 Pain in right hip: Secondary | ICD-10-CM

## 2017-07-01 DIAGNOSIS — M25552 Pain in left hip: Secondary | ICD-10-CM

## 2017-07-01 NOTE — Procedures (Signed)
PROCEDURE NOTE:  Ultrasound Guided: Injection: Bilateral hip Images were obtained and interpreted by myself, Teresa Coombs, DO  Images have been saved and stored to PACS system. Images obtained on: GE S7 Ultrasound machine    ULTRASOUND FINDINGS:  osteophytic spurring  DESCRIPTION OF PROCEDURE:  The patient's clinical condition is marked by substantial pain and/or significant functional disability. Other conservative therapy has not provided relief, is contraindicated, or not appropriate. There is a reasonable likelihood that injection will significantly improve the patient's pain and/or functional impairment.   After discussing the risks, benefits and expected outcomes of the injection and all questions were reviewed and answered, the patient wished to undergo the above named procedure.  Verbal consent was obtained.  The ultrasound was used to identify the target structure and adjacent neurovascular structures. The skin was then prepped in sterile fashion and the target structure was injected under direct visualization using sterile technique as below:  Identical technique was completed for both the Right then left hips as below: PREP: Alcohol and Ethel Chloride APPROACH: direct, sterile needle exchange technique, 22g 3.5 in. INJECTATE: 5 cc 1% lidocaine, 1 cc 0.5% Marcaine and 1 cc 40mg /mL DepoMedrol ASPIRATE: None DRESSING: Band-Aid  Post procedural instructions including recommending icing and warning signs for infection were reviewed.    This procedure was well tolerated and there were no complications.   IMPRESSION: Succesful Ultrasound Guided: Injection

## 2017-07-01 NOTE — Patient Instructions (Signed)

## 2017-07-01 NOTE — Progress Notes (Signed)
  Rodney [name], [place] [facility] [place]  Rodney [name] - 77 y.o. male MRN [id number]  Date of birth: 1940-09-30  Visit Date: 07/01/2017  PCP: Marin Olp, MD   Referred by: Marin Olp, MD  Scribe for today's visit: Wendy Poet, LAT, ATC     SUBJECTIVE:  Rodney [name] is here for Initial Assessment (R hip pain) .   His B hip (R>L)symptoms INITIALLY: Began about 5 years ago after falling due to disc degeneration secondary to multiple myeloma Described as 8/10 aching pain that can intermittently become sharp, nonradiating Worsened with laying down for prolonged periods of time Improved with nothing Additional associated symptoms include: no radiating pain into B LEs but tingling noted in B Les    At this time symptoms are worsening compared to onset w/ increased pain He has been doing as much mov't as possible but hasn't tried any formal treatment. Had an XR of R hip on 07/29/15  ROS Denies night time disturbances. Denies fevers, chills, or night sweats. Denies unexplained weight loss. Reports personal history of cancer - multiple myeloma  Denies changes in bowel or bladder habits. Reports recent unreported falls. Denies new or worsening dyspnea or wheezing. Denies headaches or dizziness.  Reports numbness, tingling or weakness  In the extremities - tingling in the B LEs Denies dizziness or presyncopal episodes Denies lower extremity edema    HISTORY & PERTINENT PRIOR DATA:  Prior History reviewed and updated per electronic medical record.  Significant/pertinent history, findings, studies include:  reports that he quit smoking about 48 years ago. He has never used smokeless tobacco. No results for input(s): HGBA1C, LABURIC, CREATINE in the last 8760 hours. No specialty comments available. No problems updated.  OBJECTIVE:  VS:  HT:5' 9.5" (176.5 cm)   WT:202 lb 3.2 oz (91.7 kg)  BMI:29.44     BP:104/70  HR:65bpm  TEMP: ( )  RESP:95 %   PHYSICAL EXAM: Constitutional: WDWN, Non-toxic appearing. Psychiatric: Alert & appropriately interactive.  Not depressed or anxious appearing. Respiratory: No increased work of breathing.  Trachea Midline Eyes: Pupils are equal.  EOM intact without nystagmus.  No scleral icterus  Vascular Exam: warm to touch no edema  upper and lower extremity neuro exam: unremarkable  MSK Exam: Marked pain with Stinchfield testing and Faber testing bilaterally.    ASSESSMENT & PLAN:   1. Pain of both hip joints   2. Primary osteoarthritis of both hips     PLAN: Bilateral hip intra-articular injections performed today.  Follow-up: Return in about 3 months (around 09/30/2017).      Please see additional documentation for Objective, Assessment and Plan sections. Pertinent additional documentation may be included in corresponding procedure notes, imaging studies, problem based documentation and patient instructions. Please see these sections of the encounter for additional information regarding this visit.  CMA/ATC served as Education administrator during this visit. History, Physical, and Plan performed by medical provider. Documentation and orders reviewed and attested to.      Gerda Diss, [facility] Physician

## 2017-07-03 ENCOUNTER — Ambulatory Visit (INDEPENDENT_AMBULATORY_CARE_PROVIDER_SITE_OTHER): Payer: Medicare Other | Admitting: Podiatry

## 2017-07-03 ENCOUNTER — Other Ambulatory Visit: Payer: Self-pay | Admitting: Family Medicine

## 2017-07-03 ENCOUNTER — Encounter: Payer: Self-pay | Admitting: Podiatry

## 2017-07-03 DIAGNOSIS — M79674 Pain in right toe(s): Secondary | ICD-10-CM

## 2017-07-03 DIAGNOSIS — B351 Tinea unguium: Secondary | ICD-10-CM | POA: Diagnosis not present

## 2017-07-03 DIAGNOSIS — M79675 Pain in left toe(s): Secondary | ICD-10-CM | POA: Diagnosis not present

## 2017-07-08 ENCOUNTER — Other Ambulatory Visit: Payer: Self-pay | Admitting: General Practice

## 2017-07-08 MED ORDER — WARFARIN SODIUM 5 MG PO TABS: ORAL_TABLET | ORAL | 3 refills | Status: DC

## 2017-07-08 NOTE — Progress Notes (Signed)
Subjective: 77 y.o. presents to the office today for painful, elongated, thickened toenails which he cannot trim himself. Denies any redness or drainage around the nails. Denies any acute changes since last appointment and no new complaints today. Denies any systemic complaints such as fevers, chills, nausea, vomiting.   PCP: Marin Olp, MD  Objective: AAO 3, NAD DP/PT pulses palpable, CRT less than 3 seconds Nails hypertrophic, dystrophic, elongated, brittle, discolored 10. There is tenderness overlying the nails 1-5 bilaterally. There is no surrounding erythema or drainage along the nail sites. No open lesions or pre-ulcerative lesions are identified. No other areas of tenderness bilateral lower extremities. No overlying edema, erythema, increased warmth. No pain with calf compression, swelling, warmth, erythema.  Assessment: Patient presents with symptomatic onychomycosis  Plan: -Treatment options including alternatives, risks, complications were discussed -Nails sharply debrided 10 without complication/bleeding. -Discussed daily foot inspection. If there are any changes, to call the office immediately.  -Follow-up in 3 months or sooner if any problems are to arise. In the meantime, encouraged to call the office with any questions, concerns, changes symptoms.  Celesta Gentile, DPM

## 2017-07-10 ENCOUNTER — Ambulatory Visit (INDEPENDENT_AMBULATORY_CARE_PROVIDER_SITE_OTHER): Payer: Medicare Other | Admitting: General Practice

## 2017-07-10 DIAGNOSIS — Z7901 Long term (current) use of anticoagulants: Secondary | ICD-10-CM

## 2017-07-10 LAB — POCT INR: INR: 3

## 2017-07-10 NOTE — Patient Instructions (Addendum)
Pre visit review using our clinic review tool, if applicable. No additional management support is needed unless otherwise documented below in the visit note.  Continue to take 1 tablet daily except 1/2 tablet on Wednesdays and Saturdays.  Re-check in 4 weeks.    

## 2017-07-10 NOTE — Progress Notes (Signed)
I have reviewed and agree with note, evaluation, plan.   Melanie Openshaw, MD  

## 2017-07-19 ENCOUNTER — Ambulatory Visit (INDEPENDENT_AMBULATORY_CARE_PROVIDER_SITE_OTHER): Payer: Medicare Other

## 2017-07-19 ENCOUNTER — Encounter: Payer: Self-pay | Admitting: Sports Medicine

## 2017-07-19 ENCOUNTER — Ambulatory Visit (INDEPENDENT_AMBULATORY_CARE_PROVIDER_SITE_OTHER): Payer: Medicare Other | Admitting: Sports Medicine

## 2017-07-19 VITALS — BP 102/74 | HR 69 | Ht 69.5 in | Wt 205.6 lb

## 2017-07-19 DIAGNOSIS — C9001 Multiple myeloma in remission: Secondary | ICD-10-CM

## 2017-07-19 DIAGNOSIS — M5136 Other intervertebral disc degeneration, lumbar region: Secondary | ICD-10-CM | POA: Diagnosis not present

## 2017-07-19 DIAGNOSIS — M25551 Pain in right hip: Secondary | ICD-10-CM | POA: Diagnosis not present

## 2017-07-19 DIAGNOSIS — M549 Dorsalgia, unspecified: Secondary | ICD-10-CM | POA: Diagnosis not present

## 2017-07-19 DIAGNOSIS — M898X9 Other specified disorders of bone, unspecified site: Secondary | ICD-10-CM | POA: Diagnosis not present

## 2017-07-19 DIAGNOSIS — G8929 Other chronic pain: Secondary | ICD-10-CM

## 2017-07-19 DIAGNOSIS — C61 Malignant neoplasm of prostate: Secondary | ICD-10-CM | POA: Diagnosis not present

## 2017-07-19 DIAGNOSIS — M25552 Pain in left hip: Secondary | ICD-10-CM

## 2017-07-19 DIAGNOSIS — M545 Low back pain: Secondary | ICD-10-CM | POA: Diagnosis not present

## 2017-07-19 MED ORDER — TRAMADOL HCL 50 MG PO TABS: 50.0000 mg | ORAL_TABLET | Freq: Four times a day (QID) | ORAL | 0 refills | Status: DC | PRN

## 2017-07-19 NOTE — Patient Instructions (Signed)

## 2017-07-19 NOTE — Progress Notes (Signed)
Rodney [name]. Rodney [name], [facility] [place]  Rodney [name] - 77 y.o. male MRN [id number]  Date of birth: December 03, 1940  Visit Date: 07/19/2017  PCP: Marin Olp, MD   Referred by: Marin Olp, MD  Scribe for today's visit: Josepha Pigg, CMA     SUBJECTIVE:  Rodney [name] is here for Follow-up (bilateral hip pain)  07/01/2017: His B hip (R>L)symptoms INITIALLY: Began about 5 years ago after falling due to disc degeneration secondary to multiple myeloma Described as 8/10 aching pain that can intermittently become sharp, nonradiating Worsened with laying down for prolonged periods of time Improved with nothing Additional associated symptoms include: no radiating pain into B LEs but tingling noted in B Les   At this time symptoms are worsening compared to onset w/ increased pain He has been doing as much mov't as possible but hasn't tried any formal treatment. Had an XR of R hip on 07/29/15  07/19/2017: Compared to the last office visit, his previously described symptoms show no change. Still having lateral hip pain in both hips, R>L.  Current symptoms are severe (8/10) & are radiating to the groin. Pain is worse in the mornings and hen first getting it.  He has been taking Tylenol with minimal relief.   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  ROS Denies night time disturbances. Denies fevers, chills, or night sweats. Denies unexplained weight loss. Reports personal history of cancer. Denies changes in bowel or bladder habits. Denies recent unreported falls. Denies new or worsening dyspnea or wheezing. Denies headaches or dizziness.  Reports numbness, tingling or weakness  In the extremities.  Denies dizziness or presyncopal episodes Denies lower extremity edema    HISTORY & PERTINENT PRIOR DATA:  Prior History reviewed and updated per electronic medical  record.  Significant/pertinent history, findings, studies include:  reports that he quit smoking about 48 years ago. He has never used smokeless tobacco. No results for input(s): HGBA1C, LABURIC, CREATINE in the last 8760 hours. No specialty comments available. No problems updated.  OBJECTIVE:  VS:  HT:5' 9.5" (176.5 cm)   WT:205 lb 9.6 oz (93.3 kg)  BMI:29.94    BP:102/74  HR:69bpm  TEMP: ( )  RESP:95 %   PHYSICAL EXAM: Constitutional: WDWN, Non-toxic appearing. Psychiatric: Alert & appropriately interactive.  Not depressed or anxious appearing. Respiratory: No increased work of breathing.  Trachea Midline Eyes: Pupils are equal.  EOM intact without nystagmus.  No scleral icterus  Vascular Exam: warm to touch no edema  lower extremity neuro exam: unremarkable normal strength normal sensation normal reflexes  MSK Exam: He has good internal and external rotation of his hips.  He has no focal bony tenderness.  Negative straight leg raises.   ASSESSMENT & PLAN:   1. Chronic bilateral back pain, unspecified back location   2. Pain of both hip joints   3. Multiple myeloma in remission ([place])   4. Bone pain   5. Other intervertebral disc degeneration, lumbar region     PLAN: Given the overall significant pain he is having as well as lack of any type of true neurologic symptoms explaining the significance of his pain and overall reassuring x-rays further diagnostic evaluation with MRI of the lumbar and pelvis indicated for further delineation of potential malignant potential versus structural etiology including possibility of OA of the hips versus lumbar spine.  We will plan  to follow-up with him after the MRIs are obtained.    Follow-up: Return for MRI results review.      Please see additional documentation for Objective, Assessment and Plan sections. Pertinent additional documentation may be included in corresponding procedure notes, imaging studies, problem based  documentation and patient instructions. Please see these sections of the encounter for additional information regarding this visit.  CMA/ATC served as Education administrator during this visit. History, Physical, and Plan performed by medical provider. Documentation and orders reviewed and attested to.      Gerda Diss, [facility] Physician

## 2017-08-02 ENCOUNTER — Encounter: Payer: Self-pay | Admitting: Sports Medicine

## 2017-08-06 ENCOUNTER — Encounter: Payer: Self-pay | Admitting: Gastroenterology

## 2017-08-06 ENCOUNTER — Ambulatory Visit (INDEPENDENT_AMBULATORY_CARE_PROVIDER_SITE_OTHER): Payer: Medicare Other | Admitting: Gastroenterology

## 2017-08-06 ENCOUNTER — Telehealth: Payer: Self-pay | Admitting: *Deleted

## 2017-08-06 VITALS — BP 100/64 | HR 68 | Ht 68.75 in | Wt 202.2 lb

## 2017-08-06 DIAGNOSIS — Z1211 Encounter for screening for malignant neoplasm of colon: Secondary | ICD-10-CM | POA: Diagnosis not present

## 2017-08-06 DIAGNOSIS — K219 Gastro-esophageal reflux disease without esophagitis: Secondary | ICD-10-CM

## 2017-08-06 NOTE — Telephone Encounter (Signed)
08/06/2017   RE: Rodney [name] DOB: 1940-04-01 MRN: [id number]   Dear Dr Yong Channel  Meriam Sprague,    We have scheduled the above patient for an endoscopic procedure. Our records show that he is on anticoagulation therapy.   Please advise as to how long the patient may come off his therapy of  coumadin prior to the procedure, which is scheduled for 08/23/2017.  Please fax back/ or route the completed form to [facility] at [phone].   Sincerely,    Genella Mech CMA

## 2017-08-06 NOTE — Telephone Encounter (Signed)
Noted  

## 2017-08-06 NOTE — Telephone Encounter (Signed)
May come off 5 days prior to procedure.  Please have him watch out for signs or symptoms of DVT or pulmonary embolism  Rodney [name]

## 2017-08-06 NOTE — Progress Notes (Signed)
Rodney [name]    [id number]    Aug 03, 1940  Primary Care Physician:Hunter, Brayton Mars, MD  Referring Physician: Marin Olp, [place] [address], [place] [address]  Chief complaint: Colorectal cancer screening  HPI:  77 year old male with history of prostate cancer status post resection in 2011, multiple myeloma in remission status post chemotherapy and autologous stem cell transplant in 2013, degenerative disc disease, chronic GERD, DVT/PE on chronic anticoagulation with Coumadin is here to discuss colorectal cancer screening.  Patient never had colonoscopy.  Denies any nausea, vomiting, abdominal pain, melena or bright red blood per rectum No family history of colon cancer or GI malignancy. Chronic acid reflux symptoms well controlled on PPI.  Denies any dysphagia, odynophagia, loss of appetite or weight loss.  Bowel habits are regular.  Outpatient Encounter Medications as of 08/06/2017  Medication Sig  . calcium carbonate (TUMS - DOSED IN MG ELEMENTAL CALCIUM) 500 MG chewable tablet Chew 1 tablet by mouth every 2 (two) hours as needed. HEART BURN    . Cholecalciferol (VITAMIN D) 1000 UNITS capsule Take 4,000 Units by mouth daily.   . citalopram (CELEXA) 10 MG tablet Take 1 tablet (10 mg total) by mouth daily.  . cyclobenzaprine (FLEXERIL) 10 MG tablet TAKE 1 TABLET BY MOUTH THREE TIMES DAILY AS NEEDED FOR MUSCLE SPASM  . gabapentin (NEURONTIN) 300 MG capsule Take 300 mg by mouth daily as needed. Nerve pain  . KLOR-CON M20 20 MEQ tablet   . levothyroxine (SYNTHROID, LEVOTHROID) 100 MCG tablet Take 1 tablet (100 mcg total) by mouth daily. **PT NEEDS PHYSICAL FOR ADDITIONAL REFILLS**  . mirabegron ER (MYRBETRIQ) 25 MG TB24 tablet Take 25 mg by mouth daily.  Marland Kitchen omeprazole (PRILOSEC) 20 MG capsule Take 1 capsule (20 mg total) by mouth daily before breakfast. HEART BURN  . POTASSIUM PO Take 1 tablet by mouth daily.  Marland Kitchen REVLIMID 10 MG capsule Take 10 mg by mouth  daily. 21 days on and 7 days off  . traMADol (ULTRAM) 50 MG tablet Take 1 tablet (50 mg total) by mouth every 6 (six) hours as needed for moderate pain.  Marland Kitchen warfarin (COUMADIN) 5 MG tablet TAKE AS DIRECTED BY ANTICOAGULATION CLINIC   No facility-administered encounter medications on file as of 08/06/2017.     Allergies as of 08/06/2017 - Review Complete 08/02/2017  Allergen Reaction Noted  . Blood-group specific substance  01/25/2012    Past Medical History:  Diagnosis Date  . Anemia 02-16-11   01-05-11- post surgery-Transfusions x 4 units  . Bone pain 02/18/2013  . Complication of anesthesia 02-16-11   Pt. speaks of awakening during the surgery from back  surgery  . Degenerative disc disease 02-16-11   01-05-11 L3 fusion done  . Esophageal reflux 05/31/2008   Qualifier: Diagnosis of  By: Arnoldo Morale MD, Balinda Quails   . Hernia 02-16-11   left inguinal hernia at present  . Hypothyroidism 02/01/2014  . Multiple myeloma 02-16-11   suspected tumor  left sacral  . Pancreatitis   . Personal history of traumatic fracture 07/10/2012  . Prostate cancer ([place])   . Pulmonary embolism ([place])   . Second degree atrioventricular block 11/26/2012  . Shortness of breath 02-16-11   hx. Pulmonary emboli x2 (9 yrs/ 3'12 -last)    Past Surgical History:  Procedure Laterality Date  . BACK SURGERY  02-16-11    01-05-11 Lumbar surgery L3(complicated by loss of blood volume)/ 01-09-11 then Lumbar  fusion done with retained  hardware   . CHOLECYSTECTOMY  04/06/2010   laparoscopic-inflammation with stones  . HERNIA REPAIR  02/20/11   Inguinal hernia repair w/mesh  . INGUINAL HERNIA REPAIR  02/20/2011   Procedure: HERNIA REPAIR INGUINAL ADULT;  Surgeon: Earnstine Regal, MD;  Location: [facility];  Service: General;  Laterality: Left;  Repair Left Inguinal Hernia with Mesh  . PROSTATE SURGERY Bilateral 2011    Family History  Problem Relation Age of Onset  . Lymphoma Father 54  . Cancer Father   . Clotting disorder Mother         blood clots  . Cancer Brother        throat ca    Social History   Socioeconomic History  . Marital status: Married    Spouse name: Not on file  . Number of children: Not on file  . Years of education: Not on file  . Highest education level: Not on file  Occupational History  . Not on file  Social Needs  . Financial resource strain: Not on file  . Food insecurity:    Worry: Not on file    Inability: Not on file  . Transportation needs:    Medical: Not on file    Non-medical: Not on file  Tobacco Use  . Smoking status: Former Smoker    Last attempt to quit: 02/15/1969    Years since quitting: 48.5  . Smokeless tobacco: Never Used  . Tobacco comment: quit 50 yrs  Substance and Sexual Activity  . Alcohol use: No    Alcohol/week: 0.0 oz  . Drug use: No  . Sexual activity: Yes  Lifestyle  . Physical activity:    Days per week: Not on file    Minutes per session: Not on file  . Stress: Not on file  Relationships  . Social connections:    Talks on phone: Not on file    Gets together: Not on file    Attends religious service: Not on file    Active member of club or organization: Not on file    Attends meetings of clubs or organizations: Not on file    Relationship status: Not on file  . Intimate partner violence:    Fear of current or ex partner: Not on file    Emotionally abused: Not on file    Physically abused: Not on file    Forced sexual activity: Not on file  Other Topics Concern  . Not on file  Social History Narrative   Married   Regular exercise-yes      Review of systems: Review of Systems  Constitutional: Negative for fever and chills.  HENT: Negative.   Eyes: Negative for blurred vision.  Respiratory: Negative for cough, shortness of breath and wheezing.   Cardiovascular: Negative for chest pain and palpitations.  Gastrointestinal: as per HPI Genitourinary: Negative for dysuria, urgency, frequency and hematuria.  Positive for urinary  incontinence Musculoskeletal: Negative for myalgias, back pain and joint pain.  Skin: Negative for itching and rash.  Neurological: Negative for dizziness, tremors, focal weakness, seizures and loss of consciousness.  Endo/Heme/Allergies: Positive for seasonal allergies.  Psychiatric/Behavioral: Negative for depression, suicidal ideas and hallucinations.  All other systems reviewed and are negative.   Physical Exam: Vitals:   08/06/17 0841  BP: 100/64  Pulse: 68   Body mass index is 30.08 kg/m. Gen:      No acute distress HEENT:  EOMI, sclera anicteric Neck:  No masses; no thyromegaly Lungs:    Clear to auscultation bilaterally; normal respiratory effort CV:         Regular rate and rhythm; no murmurs Abd:      + bowel sounds; soft, non-tender; no palpable masses, no distension Ext:    No edema; adequate peripheral perfusion Skin:      Warm and dry; no rash Neuro: alert and oriented x 3 Psych: normal mood and affect  Data Reviewed:  Reviewed labs, radiology imaging, old records and pertinent past GI work up   Assessment and Plan/Recommendations:  77 year old male with history of prostate CA status post resection, multiple myeloma status post chemotherapy and autologous stem cell transplant in 2013 in granulation, degenerative disc disease with chronic back pain, DVT/PE on chronic anticoagulation with Coumadin here to discuss colorectal cancer screening The risks and benefits as well as alternatives of endoscopic procedure(s) have been discussed and reviewed. All questions answered. The patient agrees to proceed. Will discuss with PMD if okay to hold Coumadin 5 days prior to the procedure and inform patient  GERD: Symptoms stable Continue omeprazole and antireflux measures  Return as needed after the procedure  K. Denzil Magnuson , MD [phone]    CC: Marin Olp, MD

## 2017-08-06 NOTE — Patient Instructions (Signed)
You have been scheduled for a colonoscopy. Please follow written instructions given to you at your visit today.  Please pick up your prep supplies at the pharmacy within the next 1-3 days. If you use inhalers (even only as needed), please bring them with you on the day of your procedure. Your physician has requested that you go to www.startemmi.com and enter the access code given to you at your visit today. This web site gives a general overview about your procedure. However, you should still follow specific instructions given to you by our office regarding your preparation for the procedure.  You will be contaced by our office prior to your procedure for directions on holding your Coumadin/Warfarin.  If you do not hear from our office 1 week prior to your scheduled procedure, please call [phone] to discuss.

## 2017-08-07 ENCOUNTER — Ambulatory Visit (INDEPENDENT_AMBULATORY_CARE_PROVIDER_SITE_OTHER): Payer: Medicare Other | Admitting: General Practice

## 2017-08-07 DIAGNOSIS — Z7901 Long term (current) use of anticoagulants: Secondary | ICD-10-CM

## 2017-08-07 LAB — POCT INR: INR: 3.3 — AB (ref 2.0–3.0)

## 2017-08-07 NOTE — Patient Instructions (Addendum)
Pre visit review using our clinic review tool, if applicable. No additional management support is needed unless otherwise documented below in the visit note.   Hold coumadin today and then continue to take 1 tablet daily except 1/2 tablet on Wednesdays and Saturdays.  Re-check in 4 weeks.  See patient instructions.   6/16 - Take last dose of coumadin until procedure is over.  6/22 and 6/23 - Take 1 1/2 tablets  6/24 and 6/25 - Take 1 tablet  6/26 - Continue to take 1 tablet daily except 1/2 on Wednesdays and Saturdays

## 2017-08-08 NOTE — Progress Notes (Signed)
I have reviewed and agree with note, evaluation, plan.   Stephen Hunter, MD  

## 2017-08-09 ENCOUNTER — Encounter: Payer: Self-pay | Admitting: Family Medicine

## 2017-08-09 ENCOUNTER — Ambulatory Visit (INDEPENDENT_AMBULATORY_CARE_PROVIDER_SITE_OTHER): Payer: Medicare Other | Admitting: Family Medicine

## 2017-08-09 VITALS — BP 120/64 | HR 62 | Temp 98.6°F | Resp 14 | Wt 201.6 lb

## 2017-08-09 DIAGNOSIS — J069 Acute upper respiratory infection, unspecified: Secondary | ICD-10-CM

## 2017-08-09 MED ORDER — FLUTICASONE PROPIONATE 50 MCG/ACT NA SUSP: 1.0000 | Freq: Every day | NASAL | 0 refills | Status: DC

## 2017-08-09 MED ORDER — AMOXICILLIN-POT CLAVULANATE 875-125 MG PO TABS: 1.0000 | ORAL_TABLET | Freq: Two times a day (BID) | ORAL | 0 refills | Status: DC

## 2017-08-09 MED ORDER — BENZONATATE 100 MG PO CAPS: 100.0000 mg | ORAL_CAPSULE | Freq: Two times a day (BID) | ORAL | 0 refills | Status: DC | PRN

## 2017-08-09 NOTE — Progress Notes (Signed)
Patient: Rodney [name] MRN: [id number] DOB: Feb 28, 1941 PCP: Marin Olp, MD     Subjective:  Chief Complaint  Patient presents with  . Cough    x 2 days     HPI: The patient is a 77 y.o. male who presents today for cough x 2 days. He stated yesterday he coughed up a bunch of stuff and then today he can not draw in a full breath and feels short of breath. No hx of COPD, asthma or smoking. On chronic coumadin for PE. His cough is productive in nature and is yellow pheglm. He has no known sick contacts, but works at [employer]. No fever/chills. He has taken robitussin and it has not helped him much. No other medication. He is on coumadin. He feels tight when he takes a big deep breath in. He does use a nasal spray, not sure what name it is. He denies actual chest pain or palpitations. Doe shave some congestion, right ear pressure and sinus pressure.   Review of Systems  Constitutional: Negative for activity change, appetite change and fever.  HENT: Positive for congestion, ear pain, rhinorrhea and sinus pain. Negative for facial swelling, hearing loss and sinus pressure.   Eyes: Negative for discharge and itching.  Respiratory: Positive for cough, chest tightness, shortness of breath and wheezing. Negative for apnea and stridor.   Cardiovascular: Negative for chest pain and leg swelling.  Gastrointestinal: Negative for diarrhea, nausea and vomiting.    Allergies Patient is allergic to blood-group specific substance.  Past Medical History Patient  has a past medical history of Anemia (02-16-11), Arthritis, Bone pain (63/87/5643), Complication of anesthesia (02-16-11), Degenerative disc disease (02-16-11), Esophageal reflux (05/31/2008), Hernia (02-16-11), Hypothyroidism (02/01/2014), Multiple myeloma (02-16-11), Pancreatitis, Personal history of traumatic fracture (07/10/2012), Prostate cancer ([place]), Pulmonary embolism ([place]), Second degree atrioventricular block (11/26/2012), and  Shortness of breath (02-16-11).  Surgical History Patient  has a past surgical history that includes Cholecystectomy (04/06/2010); Inguinal hernia repair (02/20/2011); Prostate surgery (Bilateral, 2011); Lumbar disc surgery (02-16-11 ); and Tonsillectomy and adenoidectomy.  Family History Pateint's family history includes Clotting disorder in his mother; Diabetes in his daughter, daughter, and son; Lung disease in his brother; Lymphoma (age of onset: 63) in his father.  Social History Patient  reports that he quit smoking about 48 years ago. He has never used smokeless tobacco. He reports that he does not drink alcohol or use drugs.    Objective: Vitals:   08/09/17 1104  BP: 120/64  Pulse: 62  Resp: 14  Temp: 98.6 F (37 C)  TempSrc: Oral  SpO2: 97%  Weight: 201 lb 9.6 oz (91.4 kg)    Body mass index is 29.99 kg/m.  Physical Exam  Constitutional: He appears well-developed and well-nourished.  HENT:  Right Ear: External ear normal.  Left Ear: External ear normal.  Nose: Nose normal.  Mouth/Throat: Oropharynx is clear and moist. No oropharyngeal exudate.  TTP over left maxillary sinus. Nasal turbinates with no edema.  bilateral TM wnl. Pearly with +light reflex.   Eyes: Conjunctivae are normal.  Cardiovascular: Normal rate, regular rhythm and normal heart sounds.  Pulmonary/Chest: Effort normal and breath sounds normal. He has no wheezes. He has no rales.  Occasional course sound, but no wheezing/rales. Good air movement throughout.   Abdominal: Soft. Bowel sounds are normal.  Lymphadenopathy:    He has no cervical adenopathy.  Vitals reviewed.      Assessment/plan: 1. URI with cough and congestion Course of augmentin to  cover for sinus infection and lungs. He was interested in steroids, but I would rather he try flonase/cool mist humidifier to open him up instead of steroid with hx of MM in remission. Would prefer we not immunosuppress him if conservative measures will  work. Also discussed wheezing he hears is supraglottic and not in lungs. Offered inhaler, but he declines. F/u if no improvement and ER precautions given for any worsening symptoms, respiratory distress/fever, etc..     Return if symptoms worsen or fail to improve.    Orma Flaming, MD [facility]   08/09/2017

## 2017-08-14 NOTE — Telephone Encounter (Signed)
Spoke with patient today, patient aware to hold coumadin 5 days before procedure

## 2017-08-15 DIAGNOSIS — C9000 Multiple myeloma not having achieved remission: Secondary | ICD-10-CM | POA: Diagnosis not present

## 2017-08-15 DIAGNOSIS — R6889 Other general symptoms and signs: Secondary | ICD-10-CM | POA: Diagnosis not present

## 2017-08-22 ENCOUNTER — Other Ambulatory Visit: Payer: Self-pay | Admitting: Internal Medicine

## 2017-08-23 ENCOUNTER — Other Ambulatory Visit: Payer: Self-pay

## 2017-08-23 ENCOUNTER — Encounter: Payer: Self-pay | Admitting: Gastroenterology

## 2017-08-23 ENCOUNTER — Ambulatory Visit (AMBULATORY_SURGERY_CENTER): Payer: Medicare Other | Admitting: Gastroenterology

## 2017-08-23 VITALS — BP 136/88 | HR 69 | Temp 96.9°F | Resp 12 | Ht 68.25 in | Wt 202.0 lb

## 2017-08-23 DIAGNOSIS — Z1211 Encounter for screening for malignant neoplasm of colon: Secondary | ICD-10-CM | POA: Diagnosis present

## 2017-08-23 MED ORDER — SODIUM CHLORIDE 0.9 % IV SOLN: 500.0000 mL | Freq: Once | INTRAVENOUS | Status: DC

## 2017-08-23 NOTE — Progress Notes (Signed)
To recovery, report to RN, VSS. 

## 2017-08-23 NOTE — Op Note (Signed)
[facility] Patient Name: Rodney [name] Procedure Date: 08/23/2017 8:58 AM MRN: [id number] Endoscopist: Mauri Pole , MD Age: 77 Referring MD:  Date of Birth: Mar 27, 1940 Gender: Male Account #: [id number] Procedure:                Colonoscopy Indications:              Screening for colorectal malignant neoplasm Medicines:                Monitored Anesthesia Care Procedure:                Pre-Anesthesia Assessment:                           - Prior to the procedure, a History and Physical                            was performed, and patient medications and                            allergies were reviewed. The patient's tolerance of                            previous anesthesia was also reviewed. The risks                            and benefits of the procedure and the sedation                            options and risks were discussed with the patient.                            All questions were answered, and informed consent                            was obtained. Prior Anticoagulants: The patient                            last took Coumadin (warfarin) 5 days prior to the                            procedure. ASA Grade Assessment: III - A patient                            with severe systemic disease. After reviewing the                            risks and benefits, the patient was deemed in                            satisfactory condition to undergo the procedure.                           After obtaining informed consent, the colonoscope  was passed under direct vision. Throughout the                            procedure, the patient's blood pressure, pulse, and                            oxygen saturations were monitored continuously. The                            Colonoscope was introduced through the anus and                            advanced to the the cecum, identified by                            appendiceal  orifice and ileocecal valve. The                            colonoscopy was technically difficult and complex                            due to inadequate bowel prep. Successful completion                            of the procedure was aided by extensive lavage and                            suction. The patient tolerated the procedure well.                            The quality of the bowel preparation was fair. The                            ileocecal valve, appendiceal orifice, and rectum                            were photographed. Scope In: 9:08:06 AM Scope Out: 9:40:01 AM Scope Withdrawal Time: 0 hours 12 minutes 54 seconds  Total Procedure Duration: 0 hours 31 minutes 55 seconds  Findings:                 The perianal and digital rectal examinations were                            normal.                           Non-bleeding internal hemorrhoids were found during                            retroflexion. The hemorrhoids were large.                           No gross lesions or large polyps, inadequate prep  to identify polyps <68mm in size or flat lesions Complications:            No immediate complications. Estimated Blood Loss:     Estimated blood loss: none. Impression:               - Non-bleeding internal hemorrhoids.                           - No specimens collected. Recommendation:           - Patient has a contact number available for                            emergencies. The signs and symptoms of potential                            delayed complications were discussed with the                            patient. Return to normal activities tomorrow.                            Written discharge instructions were provided to the                            patient.                           - Resume previous diet.                           - Continue present medications.                           - Resume Coumadin (warfarin) at prior dose  today.                            Refer to Coumadin Clinic for further adjustment of                            therapy.                           - No repeat colonoscopy due to age. Mauri Pole, MD 08/23/2017 9:44:29 AM This report has been signed electronically.

## 2017-08-23 NOTE — Progress Notes (Signed)
Passing some gas.  States he is getting more comfortable.

## 2017-08-23 NOTE — Progress Notes (Signed)
Pt. Given 7.5PY Mylicon for gas relief, per Dr. Cranford Mon.  Abdomen distended with discomfort.

## 2017-08-23 NOTE — Patient Instructions (Signed)
YOU HAD AN ENDOSCOPIC PROCEDURE TODAY AT [facility]:   Refer to the procedure report that was given to you for any specific questions about what was found during the examination.  If the procedure report does not answer your questions, please call your gastroenterologist to clarify.  If you requested that your care partner not be given the details of your procedure findings, then the procedure report has been included in a sealed envelope for you to review at your convenience later.  YOU SHOULD EXPECT: Some feelings of bloating in the abdomen. Passage of more gas than usual.  Walking can help get rid of the air that was put into your GI tract during the procedure and reduce the bloating. If you had a lower endoscopy (such as a colonoscopy or flexible sigmoidoscopy) you may notice spotting of blood in your stool or on the toilet paper. If you underwent a bowel prep for your procedure, you may not have a normal bowel movement for a few days.  Please Note:  You might notice some irritation and congestion in your nose or some drainage.  This is from the oxygen used during your procedure.  There is no need for concern and it should clear up in a day or so.  SYMPTOMS TO REPORT IMMEDIATELY:   Following lower endoscopy (colonoscopy or flexible sigmoidoscopy):  Excessive amounts of blood in the stool  Significant tenderness or worsening of abdominal pains  Swelling of the abdomen that is new, acute  Fever of 100F or higher   For urgent or emergent issues, a gastroenterologist can be reached at any hour by calling [phone].   DIET:  We do recommend a small meal at first, but then you may proceed to your regular diet.  Drink plenty of fluids but you should avoid alcoholic beverages for 24 hours.  ACTIVITY:  You should plan to take it easy for the rest of today and you should NOT DRIVE or use heavy machinery until tomorrow (because of the sedation medicines used during the test).     FOLLOW UP: Our staff will call the number listed on your records the next business day following your procedure to check on you and address any questions or concerns that you may have regarding the information given to you following your procedure. If we do not reach you, we will leave a message.  However, if you are feeling well and you are not experiencing any problems, there is no need to return our call.  We will assume that you have returned to your regular daily activities without incident.  If any biopsies were taken you will be contacted by phone or by letter within the next 1-3 weeks.  Please call us at [phone] if you have not heard about the biopsies in 3 weeks.    SIGNATURES/CONFIDENTIALITY: You and/or your care partner have signed paperwork which will be entered into your electronic medical record.  These signatures attest to the fact that that the information above on your After Visit Summary has been reviewed and is understood.  Full responsibility of the confidentiality of this discharge information lies with you and/or your care-partner.  No specimen's collected.  No follow up colonoscopy-age  Resume coumadin today.      Resume Cou

## 2017-08-26 ENCOUNTER — Telehealth: Payer: Self-pay

## 2017-08-26 NOTE — Telephone Encounter (Signed)
  Follow up Call-  Call back number 08/23/2017  Post procedure Call Back phone  # [phone]  Permission to leave phone message Yes  Some recent data might be hidden     Patient questions:  Do you have a fever, pain , or abdominal swelling? No. Pain Score  0 *  Have you tolerated food without any problems? Yes.    Have you been able to return to your normal activities? Yes.    Do you have any questions about your discharge instructions: Diet   No. Medications  No. Follow up visit  No.  Do you have questions or concerns about your Care? No.  Actions: * If pain score is 4 or above: No action needed, pain <4.

## 2017-08-28 ENCOUNTER — Ambulatory Visit (INDEPENDENT_AMBULATORY_CARE_PROVIDER_SITE_OTHER): Payer: Medicare Other | Admitting: General Practice

## 2017-08-28 DIAGNOSIS — Z7901 Long term (current) use of anticoagulants: Secondary | ICD-10-CM | POA: Diagnosis not present

## 2017-08-28 LAB — POCT INR: INR: 1.8 — AB (ref 2.0–3.0)

## 2017-08-28 NOTE — Progress Notes (Signed)
I have reviewed and agree with note, evaluation, plan.   Stephen Hunter, MD  

## 2017-08-28 NOTE — Patient Instructions (Addendum)
Pre visit review using our clinic review tool, if applicable. No additional management support is needed unless otherwise documented below in the visit note.  Take 1 tablet today and then continue to take 1 tablet daily except 1/2 tablet on Wednesdays and Saturdays.  Re-check in 4 weeks.

## 2017-09-25 ENCOUNTER — Telehealth: Payer: Self-pay | Admitting: Podiatry

## 2017-09-25 ENCOUNTER — Ambulatory Visit (INDEPENDENT_AMBULATORY_CARE_PROVIDER_SITE_OTHER): Payer: Medicare Other | Admitting: General Practice

## 2017-09-25 DIAGNOSIS — Z7901 Long term (current) use of anticoagulants: Secondary | ICD-10-CM

## 2017-09-25 LAB — POCT INR: INR: 3.9 — AB (ref 2.0–3.0)

## 2017-09-25 NOTE — Telephone Encounter (Signed)
Left message for pt to call to discuss his pain, that I did see he had an appt 10/01/2017, but if he had foot pain we may be able to get him in earlier.

## 2017-09-25 NOTE — Telephone Encounter (Signed)
This is Rodney [name]. I'm a pt up there and I need to speak with the nurse if I could please. I hope its not 24 hours from now because I've got to go to sleep sometime. But anyway, my number is [phone]. That's a local number. Thank you.

## 2017-09-25 NOTE — Patient Instructions (Addendum)
Pre visit review using our clinic review tool, if applicable. No additional management support is needed unless otherwise documented below in the visit note.  Skip coumadin today and tomorrow and then continue to take 1 tablet daily except 1/2 tablet on Wednesdays and Saturdays.  Re-check in 4 weeks.

## 2017-09-30 ENCOUNTER — Ambulatory Visit (INDEPENDENT_AMBULATORY_CARE_PROVIDER_SITE_OTHER): Payer: Medicare Other | Admitting: Sports Medicine

## 2017-09-30 ENCOUNTER — Encounter: Payer: Self-pay | Admitting: Sports Medicine

## 2017-09-30 VITALS — BP 106/68 | HR 61 | Ht 68.25 in | Wt 201.2 lb

## 2017-09-30 DIAGNOSIS — M160 Bilateral primary osteoarthritis of hip: Secondary | ICD-10-CM | POA: Diagnosis not present

## 2017-09-30 DIAGNOSIS — M549 Dorsalgia, unspecified: Secondary | ICD-10-CM | POA: Diagnosis not present

## 2017-09-30 DIAGNOSIS — C9001 Multiple myeloma in remission: Secondary | ICD-10-CM | POA: Diagnosis not present

## 2017-09-30 DIAGNOSIS — M79642 Pain in left hand: Secondary | ICD-10-CM

## 2017-09-30 DIAGNOSIS — M25551 Pain in right hip: Secondary | ICD-10-CM

## 2017-09-30 DIAGNOSIS — G8929 Other chronic pain: Secondary | ICD-10-CM

## 2017-09-30 DIAGNOSIS — M79641 Pain in right hand: Secondary | ICD-10-CM | POA: Diagnosis not present

## 2017-09-30 DIAGNOSIS — M25552 Pain in left hip: Secondary | ICD-10-CM

## 2017-09-30 DIAGNOSIS — M5136 Other intervertebral disc degeneration, lumbar region: Secondary | ICD-10-CM

## 2017-09-30 DIAGNOSIS — Z7901 Long term (current) use of anticoagulants: Secondary | ICD-10-CM | POA: Diagnosis not present

## 2017-09-30 MED ORDER — DICLOFENAC SODIUM 1 % TD GEL: TRANSDERMAL | 1 refills | Status: DC

## 2017-09-30 NOTE — Progress Notes (Signed)
Rodney [name]. Rodney [name], Rodney [name]  Rodney [name] - 77 y.o. male MRN [id number]  Date of birth: 19-Aug-1940  Visit Date: 09/30/2017  PCP: Marin Olp, MD   Referred by: Marin Olp, MD  Scribe(s) for today's visit: Wendy Poet, LAT, ATC  SUBJECTIVE:  Rodney [name] is here for Follow-up (Back and hip pain) .    07/01/2017: His B hip (R>L)symptoms INITIALLY: Began about 5 years ago after falling due to disc degeneration secondary to multiple myeloma Described as 8/10 aching pain that can intermittently become sharp, nonradiating Worsened with laying down for prolonged periods of time Improved with nothing Additional associated symptoms include: no radiating pain into B LEs but tingling noted in B Les   At this time symptoms are worsening compared to onset w/ increased pain He has been doing as much mov't as possible but hasn't tried any formal treatment. Had an XR of R hip on 07/29/15  07/19/2017: Compared to the last office visit, his previously described symptoms show no change. Still having lateral hip pain in both hips, R>L.  Current symptoms are severe (8/10) & are radiating to the groin. Pain is worse in the mornings and hen first getting it.  He has been taking Tylenol with minimal relief.   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  09/30/17 Compared to the last office visit on 07/19/17, his previously described back and B hip pain (L>R) symptoms are worsening Current symptoms are ranging from mild to severe depending on activity & are radiating to L lateral thigh.  He states that sitting at rest he has 2/10 pain but pain increases w/ lumbar flexion and also when attempting to cross his L leg over his R.  He states that his knees are also giving out on him intermittently. He has been taking Tylenol prn.  L-spine and T-spine XR - 07/19/17 B hip XR -  07/01/17 L-spine MRI ordered but not completed   REVIEW OF SYSTEMS: Denies night time disturbances. Denies fevers, chills, or night sweats. Denies unexplained weight loss. Reports personal history of cancer.  Hx of multiple myeloma. Denies changes in bowel or bladder habits. Denies recent unreported falls. Denies new or worsening dyspnea or wheezing. Denies headaches or dizziness.  Reports numbness, tingling or weakness  In the extremities.  Denies dizziness or presyncopal episodes Denies lower extremity edema    HISTORY & PERTINENT PRIOR DATA:  Prior History reviewed and updated per electronic medical record.  Significant/pertinent history, findings, studies include:  reports that he quit smoking about 48 years ago. He has never used smokeless tobacco. No results for input(s): HGBA1C, LABURIC, CREATINE in the last 8760 hours. No specialty comments available. No problems updated.  OBJECTIVE:  VS:  HT:5' 8.25" (173.4 cm)   WT:201 lb 3.2 oz (91.3 kg)  BMI:30.35    BP:106/68  HR:61bpm  TEMP: ( )  RESP:95 %   PHYSICAL EXAM: Constitutional: WDWN, Non-toxic appearing. Psychiatric: Alert & appropriately interactive.  Not depressed or anxious appearing. Respiratory: No increased work of breathing.  Trachea Midline Eyes: Pupils are equal.  EOM intact without nystagmus.  No scleral icterus  Vascular Exam: warm to touch no edema  lower extremity neuro exam: unremarkable  MSK Exam: He has some pain with straight leg raise.  Worst pain with leftward side bending.  He has left paraspinal muscle spasm today.  Limited internal  and external rotation of bilateral hips.  He is able to heel and toe walk but it is difficult for him.   ASSESSMENT & PLAN:   1. Long term (current) use of anticoagulants   2. Chronic bilateral back pain, unspecified back location   3. Pain of both hip joints   4. Multiple myeloma in remission ([place])   5. Primary osteoarthritis of both hips   6. Other  intervertebral disc degeneration, lumbar region   7. Pain in both hands     PLAN: The previously ordered MRIs have not been scheduled but he had to do that today during his office visit and will plan to check in with him following obtaining these test.  He will likely be a candidate for epidural steroid injections but given his prior history this needs to be further evaluated prior to pursuing.  He may ultimately be a candidate for facet blocks RFA if his persistent axial back pain does not improve with the epidurals but given his a slight radicular nature this would be the first test I would likely choose to pursue after the MRI is obtained.  He was also complaining of bilateral hand pain and numbness that does seem to be worse first thing in the morning.  We had him for today for wrist braces to be worn at night for presumed carpal tunnel.  Also will have him try diclofenac gel to be used as needed.  Discussed low interaction risk between his Coumadin and the topical diclofenac.  Follow-up after the MRI is obtained and if persistent hand symptoms can consider diagnostic ultrasound and possible Hydro dissection of the median nerves.  Follow-up: Return in about 9 days (around 10/09/2017).      Please see additional documentation for Objective, Assessment and Plan sections. Pertinent additional documentation may be included in corresponding procedure notes, imaging studies, problem based documentation and patient instructions. Please see these sections of the encounter for additional information regarding this visit.  CMA/ATC served as Education administrator during this visit. History, Physical, and Plan performed by medical provider. Documentation and orders reviewed and attested to.      Gerda Diss, [facility] Physician

## 2017-09-30 NOTE — Patient Instructions (Addendum)
[facility] [phone]  We are ordering an MRI for you today.  The imaging office will be calling you to schedule your appointment after we obtain authorization from your insurance company.   Please be sure you have signed up for MyChart so that we can get your results to you.  We will be in touch with you as soon as we can.  Please know, it can take up to 3-4 business days for the radiologist and Dr. Rigby to have time to review the results and determine the best appropriate action.  If there is something that appears to be surgical or needs a referral to other specialists we will let you know through MyChart or telephone.  Otherwise we will plan to schedule a follow up appointment with Dr. Rigby once we have the results.   

## 2017-10-01 ENCOUNTER — Ambulatory Visit (INDEPENDENT_AMBULATORY_CARE_PROVIDER_SITE_OTHER): Payer: Medicare Other | Admitting: Podiatry

## 2017-10-01 ENCOUNTER — Ambulatory Visit: Payer: Medicare Other | Admitting: Podiatry

## 2017-10-01 ENCOUNTER — Encounter: Payer: Self-pay | Admitting: Podiatry

## 2017-10-01 ENCOUNTER — Ambulatory Visit (INDEPENDENT_AMBULATORY_CARE_PROVIDER_SITE_OTHER): Payer: Medicare Other

## 2017-10-01 DIAGNOSIS — M7750 Other enthesopathy of unspecified foot: Secondary | ICD-10-CM

## 2017-10-01 DIAGNOSIS — M79674 Pain in right toe(s): Secondary | ICD-10-CM

## 2017-10-01 DIAGNOSIS — B351 Tinea unguium: Secondary | ICD-10-CM | POA: Diagnosis not present

## 2017-10-01 DIAGNOSIS — M76829 Posterior tibial tendinitis, unspecified leg: Secondary | ICD-10-CM

## 2017-10-01 DIAGNOSIS — M779 Enthesopathy, unspecified: Secondary | ICD-10-CM | POA: Diagnosis not present

## 2017-10-01 DIAGNOSIS — M79675 Pain in left toe(s): Secondary | ICD-10-CM | POA: Diagnosis not present

## 2017-10-01 DIAGNOSIS — M2140 Flat foot [pes planus] (acquired), unspecified foot: Secondary | ICD-10-CM

## 2017-10-02 ENCOUNTER — Other Ambulatory Visit: Payer: Self-pay | Admitting: Family Medicine

## 2017-10-02 NOTE — Progress Notes (Signed)
Subjective: 77 y.o. presents to the office today for painful, elongated, thickened toenails which he cannot trim himself. Denies any redness or drainage around the nails.  He also has new concerns of pain to his right foot.  This is been ongoing for some time and getting worse.  He states that he feels that he drags his foot at times when he walks.  Denies any recent injury.  Denies any acute changes since last appointment and no new complaints today. Denies any systemic complaints such as fevers, chills, nausea, vomiting.   PCP: Marin Olp, MD  Objective: AAO 3, NAD DP/PT pulses palpable, CRT less than 3 seconds Nails hypertrophic, dystrophic, elongated, brittle, discolored 10. There is tenderness overlying the nails 1-5 bilaterally. There is no surrounding erythema or drainage along the nail sites. Significant flatfoot deformities present bilaterally the right side worse than left.  There is tenderness palpation the course the posterior tibial tendon on the right side just inferior to the medial malleolus and just proximal to the navicular tuberosity.  There is no significant edema there is no erythema increased warmth.  Ankle, subtalar joint range of motion intact there is no area pinpoint tenderness. No open lesions or pre-ulcerative lesions are identified. No other areas of tenderness bilateral lower extremities. No overlying edema, erythema, increased warmth. No pain with calf compression, swelling, warmth, erythema.  Assessment: Patient presents with symptomatic onychomycosis; PTTD/flatfoot deformity  Plan: -Treatment options including alternatives, risks, complications were discussed -Nails sharply debrided 10 without complication/bleeding. -X-rays were obtained reviewed.  No evidence of acute fractures identified today.  We discussed various treatment options.  He will likely benefit from a custom molded orthotic to help support his foot although he might need a AFO ankle  brace.  I dispensed a Tri-Lock ankle brace to wear the short-term.  I will have him follow-up with Liliane Channel or [facility] for evaluation.  Discussed possible MRI but he was to start with this first. -Discussed daily foot inspection. If there are any changes, to call the office immediately.  -Follow-up in 3 months for routine care we will see him prior to that for the right foot pain or sooner if any problems are to arise. In the meantime, encouraged to call the office with any questions, concerns, changes symptoms.  Celesta Gentile, DPM

## 2017-10-07 ENCOUNTER — Inpatient Hospital Stay: Admission: RE | Admit: 2017-10-07 | Payer: Medicare Other | Source: Ambulatory Visit

## 2017-10-07 ENCOUNTER — Ambulatory Visit
Admission: RE | Admit: 2017-10-07 | Discharge: 2017-10-07 | Disposition: A | Discharge status: Home / Self Care | Payer: Medicare Other | Source: Ambulatory Visit | Attending: Sports Medicine | Admitting: Sports Medicine

## 2017-10-07 DIAGNOSIS — G8929 Other chronic pain: Secondary | ICD-10-CM

## 2017-10-07 DIAGNOSIS — M7062 Trochanteric bursitis, left hip: Secondary | ICD-10-CM | POA: Diagnosis not present

## 2017-10-07 DIAGNOSIS — M48061 Spinal stenosis, lumbar region without neurogenic claudication: Secondary | ICD-10-CM | POA: Diagnosis not present

## 2017-10-07 DIAGNOSIS — C9001 Multiple myeloma in remission: Secondary | ICD-10-CM

## 2017-10-07 DIAGNOSIS — M549 Dorsalgia, unspecified: Principal | ICD-10-CM

## 2017-10-09 ENCOUNTER — Ambulatory Visit (INDEPENDENT_AMBULATORY_CARE_PROVIDER_SITE_OTHER): Payer: Medicare Other | Admitting: Sports Medicine

## 2017-10-09 ENCOUNTER — Encounter: Payer: Self-pay | Admitting: Sports Medicine

## 2017-10-09 VITALS — BP 110/72 | HR 66 | Ht 68.5 in | Wt 201.8 lb

## 2017-10-09 DIAGNOSIS — S76012A Strain of muscle, fascia and tendon of left hip, initial encounter: Secondary | ICD-10-CM | POA: Diagnosis not present

## 2017-10-09 DIAGNOSIS — M160 Bilateral primary osteoarthritis of hip: Secondary | ICD-10-CM | POA: Diagnosis not present

## 2017-10-09 DIAGNOSIS — G8929 Other chronic pain: Secondary | ICD-10-CM

## 2017-10-09 DIAGNOSIS — M549 Dorsalgia, unspecified: Secondary | ICD-10-CM | POA: Diagnosis not present

## 2017-10-09 MED ORDER — METHYLPREDNISOLONE ACETATE 80 MG/ML IJ SUSP
80.0000 mg | Freq: Once | INTRAMUSCULAR | Status: AC
Start: 2017-10-09 — End: 2017-10-09
  Administered 2017-10-09: 80 mg via INTRAMUSCULAR

## 2017-10-09 MED ORDER — DIAZEPAM 5 MG PO TABS: 5.0000 mg | ORAL_TABLET | Freq: Three times a day (TID) | ORAL | 0 refills | Status: DC | PRN

## 2017-10-09 NOTE — Progress Notes (Signed)
Rodney [name]. Rodney [name], Rodney [name] at [facility]  Rodney [name] - 77 y.o. male MRN [id number]  Date of birth: 08-24-1940  Visit Date: 10/09/2017  PCP: Marin Olp, MD   Referred by: Marin Olp, MD  Scribe(s) for today's visit: Josepha Pigg, CMA  SUBJECTIVE:  Rodney [name] is here for Follow-up (back and bilateral hip pain)   07/01/2017: His B hip (R>L)symptoms INITIALLY: Began about 5 years ago after falling due to disc degeneration secondary to multiple myeloma Described as 8/10 aching pain that can intermittently become sharp, nonradiating Worsened with laying down for prolonged periods of time Improved with nothing Additional associated symptoms include: no radiating pain into B LEs but tingling noted in B Les   At this time symptoms are worsening compared to onset w/ increased pain He has been doing as much mov't as possible but hasn't tried any formal treatment. Had an XR of R hip on 07/29/15  07/19/2017: Compared to the last office visit, his previously described symptoms show no change. Still having lateral hip pain in both hips, R>L.  Current symptoms are severe (8/10) & are radiating to the groin. Pain is worse in the mornings and hen first getting it.  He has been taking Tylenol with minimal relief.  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  09/30/17 Compared to the last office visit on 07/19/17, his previously described back and B hip pain (L>R) symptoms are worsening Current symptoms are ranging from mild to severe depending on activity & are radiating to L lateral thigh.  He states that sitting at rest he has 2/10 pain but pain increases w/ lumbar flexion and also when attempting to cross his L leg over his R.  He states that his knees are also giving out on him intermittently. He has been taking Tylenol prn. L-spine and T-spine XR - 07/19/17 B hip XR -  07/01/17 L-spine MRI ordered but not completed  10/09/2017: Compared to the last office visit, his previously described symptoms are worsening.  Current symptoms are mild-severe depending on activity level & are radiating to the lateral aspect of the L thigh.  He has been taking Tylenol prn but hasn't had any in the past 2 days.  He had MRI 10/07/2017, he reports that this caused increased pain d/t lying still.    REVIEW OF SYSTEMS: Denies night time disturbances. Denies fevers, chills, or night sweats. Denies unexplained weight loss. Reports personal history of cancer. Denies changes in bowel or bladder habits. Denies recent unreported falls. Denies new or worsening dyspnea or wheezing. Denies headaches or dizziness.  Reports numbness, tingling or weakness  In the extremities.  Denies dizziness or presyncopal episodes Denies lower extremity edema    HISTORY:  Prior history reviewed and updated per electronic medical record.  Social History   Occupational History  . Occupation: retired  Tobacco Use  . Smoking status: Former Smoker    Last attempt to quit: 02/15/1969    Years since quitting: 48.7  . Smokeless tobacco: Never Used  . Tobacco comment: quit 50 yrs  Substance and Sexual Activity  . Alcohol use: No    Alcohol/week: 0.0 standard drinks  . Drug use: No  . Sexual activity: Yes   Social History   Social History Narrative   Married   Regular exercise-yes    Past Medical History:  Diagnosis Date  . Anemia 02-16-11  01-05-11- post surgery-Transfusions x 4 units  . Arthritis   . Bone pain 02/18/2013  . Clotting disorder ([place])   . Complication of anesthesia 02-16-11   Pt. speaks of awakening during the surgery from back  surgery  . Degenerative disc disease 02-16-11   01-05-11 L3 fusion done  . Esophageal reflux 05/31/2008   Qualifier: Diagnosis of  By: Arnoldo Morale MD, Balinda Quails   . Hernia 02-16-11   left inguinal hernia at present  . Hypothyroidism 02/01/2014  .  Multiple myeloma 02-16-11   suspected tumor  left sacral  . Pancreatitis   . Personal history of traumatic fracture 07/10/2012  . Prostate cancer ([place])   . Pulmonary embolism ([place])   . Second degree atrioventricular block 11/26/2012  . Shortness of breath 02-16-11   hx. Pulmonary emboli x2 (9 yrs/ 3'12 -last)   Past Surgical History:  Procedure Laterality Date  . APPENDECTOMY    . CHOLECYSTECTOMY  04/06/2010   laparoscopic-inflammation with stones  . INGUINAL HERNIA REPAIR  02/20/2011   Procedure: HERNIA REPAIR INGUINAL ADULT;  Surgeon: Earnstine Regal, MD;  Location: [facility];  Service: General;  Laterality: Left;  Repair Left Inguinal Hernia with Mesh  . LUMBAR DISC SURGERY  02-16-11    01-05-11 Lumbar surgery L3(complicated by loss of blood volume)/ 01-09-11 then Lumbar fusion done with retained  hardware   . PROSTATE SURGERY Bilateral 2011  . TONSILLECTOMY AND ADENOIDECTOMY     family history includes Clotting disorder in his mother; Diabetes in his daughter, daughter, and son; Lung disease in his brother; Lymphoma (age of onset: 29) in his father. There is no history of Colon cancer, Esophageal cancer, Rectal cancer, or Stomach cancer.  DATA OBTAINED & REVIEWED:  No results for input(s): HGBA1C, LABURIC, CREATINE in the last 8760 hours. Marland Kitchen MRI of the lumbar spine and pelvis on 10/07/2017: 1. L3 corpectomy and L2-L4 posterior instrumented fusion. No apparent hardware related complication. 2. Left hemipelvis lytic lesion with interval near complete fatty and small cyst replacement. No new lesion identified. 3. Progression of lumbar spondylosis at the L4-5 and L5-S1 levels. 4. Moderate bilateral L4-5 and L5-S1 canal stenosis. Moderate bilateral L4-5 and mild bilateral L5-S1 foraminal stenosis.  1. Moderate left greater trochanteric bursitis. 2. Tear of the left gluteus minimus tendon. 3. Large fat attenuating intramedullary bone lesion in the left side of the measuring approximately 5.5 x 3.9 x  4.1 cm with cystic areas within the bone lesion with the overall appearance most consistent with treated malignancy. 4. 8 mm T2 hyperintense and T1 hypointense bone lesion in the proximal left femoral diaphysis likely reflecting treated bone lesion. Recommend correlation with laboratory values and follow-up MRI in 3-6 months. .   OBJECTIVE:  VS:  HT:5' 8.5" (174 cm)   WT:201 lb 12.8 oz (91.5 kg)  BMI:30.23    BP:110/72  HR:66bpm  TEMP: ( )  RESP:94 %   PHYSICAL EXAM: CONSTITUTIONAL: Well-developed, Well-nourished and In no acute distress PSYCHIATRIC: Alert & appropriately interactive. and Not depressed or anxious appearing. RESPIRATORY: No increased work of breathing and Trachea Midline EYES: Pupils are equal., EOM intact without nystagmus. and No scleral icterus.  VASCULAR EXAM: Warm and well perfused NEURO: unremarkable  MSK Exam: Back and hips  Well aligned.  No significant scoliotic curvature.  He walks with an antalgic gait. Well-healed postsurgical incision of the midline.  No overlying bruising or ecchymosis. No focal bony tenderness TTP over Left greater than right SI joint.  Mildly over the left  gluteal musculature.  Minimal pain over the greater trochanter. Non tender over Midline spine   SPECIALITY TESTING:  He is able to heel and toe walk although slightly painful.  Positive straight leg raise on the left, minimal on the right.  Good internal and external rotation bilateral hips    ASSESSMENT   1. Chronic bilateral back pain, unspecified back location     PLAN:  Pertinent additional documentation may be included in corresponding procedure notes, imaging studies, problem based documentation and patient instructions.  Procedures:  . None  Medications:  Meds ordered this encounter  Medications  . methylPREDNISolone acetate (DEPO-MEDROL) injection 80 mg  . DISCONTD: diazepam (VALIUM) 5 MG tablet    Sig: Take 1 tablet (5 mg total) by mouth every 8 (eight)  hours as needed for anxiety or muscle spasms.    Dispense:  30 tablet    Refill:  0  . DISCONTD: diazepam (VALIUM) 5 MG tablet    Sig: Take 1 tablet (5 mg total) by mouth every 8 (eight) hours as needed for anxiety or muscle spasms.    Dispense:  30 tablet    Refill:  0   Discussion/Instructions: No problem-specific Assessment & Plan notes found for this encounter.  Marland Kitchen Ultimately symptoms do not entirely correlate with the most obvious finding which is the glute medius tear and greater trochanteric bursitis is not focally sore directly over this area.  Could consider injection into this area if any lack of improvement. . Lab work obtained given prior treated metastases.  Case was discussed with Dr. Yong Channel. . Discussed red flag symptoms that warrant earlier emergent evaluation and patient voices understanding. . Activity modifications and the importance of avoiding exacerbating activities (limiting pain to no more than a 4 / 10 during or following activity) recommended and discussed. . >50% of this 25 minutes minute visit spent in direct patient counseling and/or coordination of care. Discussion was focused on education regarding the in discussing the pathoetiology and anticipated clinical course of the above condition.  Follow-up:  . Return in about 1 month (around 11/06/2017).   . If any lack of improvement consider:   . referral to Physical Therapy . Biologic Therapy with Nitro-Glycerin Protocol . Biologic Therapy with PRP . Diagnostic and therapeutic intra-articular or greater trochanteric injection  .      CMA/ATC served as Education administrator during this visit. History, Physical, and Plan performed by medical provider. Documentation and orders reviewed and attested to.      Gerda Diss, [facility] Physician

## 2017-10-10 LAB — CBC WITH DIFFERENTIAL/PLATELET
BASOS ABS: 0 10*3/uL (ref 0.0–0.1)
Basophils Relative: 1 % (ref 0.0–3.0)
EOS PCT: 2.4 % (ref 0.0–5.0)
Eosinophils Absolute: 0.1 10*3/uL (ref 0.0–0.7)
HEMATOCRIT: 40.7 % (ref 39.0–52.0)
HEMOGLOBIN: 14.1 g/dL (ref 13.0–17.0)
LYMPHS PCT: 21.4 % (ref 12.0–46.0)
Lymphs Abs: 0.9 10*3/uL (ref 0.7–4.0)
MCHC: 34.6 g/dL (ref 30.0–36.0)
MCV: 96.8 fl (ref 78.0–100.0)
Monocytes Absolute: 1.1 10*3/uL — ABNORMAL HIGH (ref 0.1–1.0)
Neutro Abs: 2.1 10*3/uL (ref 1.4–7.7)
Neutrophils Relative %: 49.4 % (ref 43.0–77.0)
Platelets: 222 10*3/uL (ref 150.0–400.0)
RBC: 4.21 Mil/uL — AB (ref 4.22–5.81)
RDW: 15.5 % (ref 11.5–15.5)
WBC: 4.1 10*3/uL (ref 4.0–10.5)

## 2017-10-10 LAB — COMPREHENSIVE METABOLIC PANEL
ALBUMIN: 3.9 g/dL (ref 3.5–5.2)
ALK PHOS: 98 U/L (ref 39–117)
ALT: 18 U/L (ref 0–53)
AST: 22 U/L (ref 0–37)
BUN: 22 mg/dL (ref 6–23)
CO2: 26 mEq/L (ref 19–32)
Calcium: 9.3 mg/dL (ref 8.4–10.5)
Chloride: 105 mEq/L (ref 96–112)
Creatinine, Ser: 1.21 mg/dL (ref 0.40–1.50)
GFR: 62.13 mL/min (ref >60.00)
Glucose, Bld: 86 mg/dL (ref 70–99)
POTASSIUM: 4.4 meq/L (ref 3.5–5.1)
Sodium: 137 mEq/L (ref 135–145)
TOTAL PROTEIN: 6.7 g/dL (ref 6.0–8.3)
Total Bilirubin: 0.5 mg/dL (ref 0.2–1.2)

## 2017-10-10 LAB — CALCIUM, IONIZED: Calcium, Ion: 5.17 mg/dL (ref 4.8–5.6)

## 2017-10-10 LAB — PHOSPHORUS: PHOSPHORUS: 2.9 mg/dL (ref 2.3–4.6)

## 2017-10-10 LAB — C-REACTIVE PROTEIN: CRP: 0.5 mg/dL (ref 0.5–20.0)

## 2017-10-10 LAB — SEDIMENTATION RATE: SED RATE: 13 mm/h (ref 0–20)

## 2017-10-10 LAB — MAGNESIUM: Magnesium: 2 mg/dL (ref 1.5–2.5)

## 2017-10-14 ENCOUNTER — Ambulatory Visit: Payer: Medicare Other | Admitting: Orthotics

## 2017-10-14 DIAGNOSIS — M2140 Flat foot [pes planus] (acquired), unspecified foot: Secondary | ICD-10-CM

## 2017-10-14 DIAGNOSIS — M76829 Posterior tibial tendinitis, unspecified leg: Secondary | ICD-10-CM

## 2017-10-15 NOTE — Progress Notes (Signed)
Patient asked, "Is Medicare going to pay for these?" I said no, "Is [facility]?" I said no.  He said, "Good-bye".

## 2017-10-16 NOTE — Progress Notes (Signed)
See office visit notes ultimately his symptoms are more likely reflective of the lumbar spine issues given the location of his pain.  If any persistent ongoing symptoms consider greater trochanteric injection.

## 2017-10-16 NOTE — Progress Notes (Signed)
Symptoms are most consistent with his lumbar spine findings.  See office visit notes for further details.

## 2017-10-19 ENCOUNTER — Emergency Department (HOSPITAL_COMMUNITY)
Admission: EM | Admit: 2017-10-19 | Discharge: 2017-10-19 | Disposition: A | Discharge status: Home / Self Care | Payer: No Typology Code available for payment source | Attending: Emergency Medicine | Admitting: Emergency Medicine

## 2017-10-19 ENCOUNTER — Encounter (HOSPITAL_COMMUNITY): Payer: Self-pay | Admitting: Emergency Medicine

## 2017-10-19 ENCOUNTER — Emergency Department (HOSPITAL_COMMUNITY): Payer: No Typology Code available for payment source

## 2017-10-19 DIAGNOSIS — Z87891 Personal history of nicotine dependence: Secondary | ICD-10-CM | POA: Insufficient documentation

## 2017-10-19 DIAGNOSIS — Z7901 Long term (current) use of anticoagulants: Secondary | ICD-10-CM | POA: Insufficient documentation

## 2017-10-19 DIAGNOSIS — Z85828 Personal history of other malignant neoplasm of skin: Secondary | ICD-10-CM | POA: Diagnosis not present

## 2017-10-19 DIAGNOSIS — E039 Hypothyroidism, unspecified: Secondary | ICD-10-CM | POA: Diagnosis not present

## 2017-10-19 DIAGNOSIS — Z8546 Personal history of malignant neoplasm of prostate: Secondary | ICD-10-CM | POA: Insufficient documentation

## 2017-10-19 DIAGNOSIS — Z79899 Other long term (current) drug therapy: Secondary | ICD-10-CM | POA: Insufficient documentation

## 2017-10-19 DIAGNOSIS — N50812 Left testicular pain: Secondary | ICD-10-CM

## 2017-10-19 DIAGNOSIS — R1032 Left lower quadrant pain: Secondary | ICD-10-CM

## 2017-10-19 LAB — COMPREHENSIVE METABOLIC PANEL
ALBUMIN: 3.9 g/dL (ref 3.5–5.0)
ALT: 44 U/L (ref 0–44)
ANION GAP: 6 (ref 5–15)
AST: 39 U/L (ref 15–41)
Alkaline Phosphatase: 91 U/L (ref 38–126)
BILIRUBIN TOTAL: 0.9 mg/dL (ref 0.3–1.2)
BUN: 22 mg/dL (ref 8–23)
CO2: 25 mmol/L (ref 22–32)
Calcium: 8.8 mg/dL — ABNORMAL LOW (ref 8.9–10.3)
Chloride: 105 mmol/L (ref 98–111)
Creatinine, Ser: 1.42 mg/dL — ABNORMAL HIGH (ref 0.61–1.24)
GFR calc Af Amer: 54 mL/min — ABNORMAL LOW (ref >60)
GFR calc non Af Amer: 47 mL/min — ABNORMAL LOW (ref >60)
GLUCOSE: 82 mg/dL (ref 70–99)
Potassium: 4.6 mmol/L (ref 3.5–5.1)
Sodium: 136 mmol/L (ref 135–145)
Total Protein: 7 g/dL (ref 6.5–8.1)

## 2017-10-19 LAB — CBC
HCT: 44.1 % (ref 39.0–52.0)
Hemoglobin: 14.5 g/dL (ref 13.0–17.0)
MCH: 32.7 pg (ref 26.0–34.0)
MCHC: 32.9 g/dL (ref 30.0–36.0)
MCV: 99.5 fL (ref 78.0–100.0)
PLATELETS: 175 10*3/uL (ref 150–400)
RBC: 4.43 MIL/uL (ref 4.22–5.81)
RDW: 14.8 % (ref 11.5–15.5)
WBC: 7 10*3/uL (ref 4.0–10.5)

## 2017-10-19 LAB — PROTIME-INR
INR: 3.22
Prothrombin Time: 32.7 seconds — ABNORMAL HIGH (ref 11.4–15.2)

## 2017-10-19 NOTE — ED Triage Notes (Signed)
Pt presents to ED for assessment left testicular pain with palpation.  Pt states he was lifting a watermelon at work when the pain began.  Patient states similar to other hernias, just never in this place on his body.  Patient denies pain sitting still.

## 2017-10-19 NOTE — Discharge Instructions (Signed)
Please read and follow all provided instructions.  Your diagnoses today include:  1. Left inguinal pain   2. Pain in left testicle     Tests performed today include:  Ultrasound of the scrotum -no signs of hernia, small amount of fluid around the testicles  Coumadin level - slightly high  Vital signs. See below for your results today.   Medications prescribed:   Tylenol as needed for pain  Take any prescribed medications only as directed.  Home care instructions:  Follow any educational materials contained in this packet.  BE VERY CAREFUL not to take multiple medicines containing Tylenol (also called acetaminophen). Doing so can lead to an overdose which can damage your liver and cause liver failure and possibly death.   Follow-up instructions: Please follow-up with your primary care provider in the next 3 days for further evaluation of your symptoms.   Return instructions:   Please return to the Emergency Department if you experience worsening symptoms.   Return if you have worsening pain, swelling, bruising in the left groin.  Please return if you have any other emergent concerns.  Additional Information:  Your vital signs today were: BP (!) 97/53    Pulse (!) 59    Temp 99.2 F (37.3 C) (Oral)    Resp 16    SpO2 100%  If your blood pressure (BP) was elevated above 135/85 this visit, please have this repeated by your doctor within one month. --------------

## 2017-10-19 NOTE — ED Provider Notes (Signed)
[facility] EMERGENCY DEPARTMENT Provider Note   CSN: [id number] Arrival date & time: 10/19/17  1507     History   Chief Complaint Chief Complaint  Patient presents with  . Groin Pain    HPI Rodney [name] is a 77 y.o. male.  Patient with history of multiple myeloma, Coumadin use presents with complaint of acute onset of left groin pain.  Patient states that he has had pain which was very mild for several days.  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  He was able to continue working in a reduced capacity.  He did not feel any bulges or lumps in the area.  He was able to ambulate.  He did not take any medications for this and states that he does not like taking anything more than Tylenol, even for his multiple myeloma pain.  Pain is worse with movement and walking, less with sitting and lying flat.  No difficulty with urination.  No difficulty with bowel movements.  No fevers.      Past Medical History:  Diagnosis Date  . Anemia 02-16-11   01-05-11- post surgery-Transfusions x 4 units  . Arthritis   . Bone pain 02/18/2013  . Clotting disorder ([place])   . Complication of anesthesia 02-16-11   Pt. speaks of awakening during the surgery from back  surgery  . Degenerative disc disease 02-16-11   01-05-11 L3 fusion done  . Esophageal reflux 05/31/2008   Qualifier: Diagnosis of  By: Arnoldo Morale MD, Balinda Quails   . Hernia 02-16-11   left inguinal hernia at present  . Hypothyroidism 02/01/2014  . Multiple myeloma 02-16-11   suspected tumor  left sacral  . Pancreatitis   . Personal history of traumatic fracture 07/10/2012  . Prostate cancer ([place])   . Pulmonary embolism ([place])   . Second degree atrioventricular block 11/26/2012  . Shortness of breath 02-16-11   hx. Pulmonary emboli x2 (9 yrs/ 3'12 -last)    Patient Active Problem List   Diagnosis Date Noted  . Vitamin D deficiency 04/09/2017  . Overactive bladder 04/09/2017  . History of skin cancer 04/09/2017  . Back pain, thoracic 12/19/2016  . Long term (current) use of anticoagulants 12/14/2016  . Recurrent major depression in full remission ([place]) 10/02/2016  . Lump of skin 04/23/2016  . Bilateral hip pain 04/06/2016  . Carpal tunnel syndrome on both sides 01/24/2016  . Right foot pain 10/05/2015  . Tendonitis of ankle, left 10/03/2015  . Hypothyroidism 02/01/2014  . Allergic rhinitis 02/01/2014  . Bone pain 02/18/2013  . Multiple myeloma in remission ([place]) 07/10/2012  . Back pain, lumbosacral 02/11/2012  . Esophageal reflux 05/31/2008  . History of duodenal ulcer 01/05/2008  . PROSTATE CANCER, HX OF 01/05/2008  . Pulmonary embolism ([place]) 01/05/2008    Past Surgical History:  Procedure Laterality Date  . APPENDECTOMY    . CHOLECYSTECTOMY  04/06/2010   laparoscopic-inflammation with stones  . INGUINAL HERNIA REPAIR  02/20/2011   Procedure: HERNIA REPAIR INGUINAL ADULT;  Surgeon: Earnstine Regal, MD;  Location: [facility];  Service: General;  Laterality: Left;  Repair Left Inguinal Hernia with Mesh  . LUMBAR DISC SURGERY  02-16-11    01-05-11 Lumbar surgery L3(complicated by loss of blood volume)/ 01-09-11 then Lumbar fusion done with retained  hardware   . PROSTATE SURGERY Bilateral 2011  .  TONSILLECTOMY AND ADENOIDECTOMY          Home Medications    Prior to Admission medications   Medication Sig Start Date End Date Taking? Authorizing Provider  calcium carbonate (TUMS - DOSED IN MG ELEMENTAL CALCIUM) 500 MG chewable tablet Chew 1 tablet by mouth every 2 (two) hours as needed. HEART BURN      [provider]  Cholecalciferol (VITAMIN D) 1000 UNITS capsule Take 4,000 Units by mouth daily.     [provider]  citalopram (CELEXA) 10 MG tablet Take 1 tablet (10 mg total) by mouth daily. 06/13/17 06/13/18  Marin Olp, MD  cyclobenzaprine  (FLEXERIL) 10 MG tablet TAKE 1 TABLET BY MOUTH THREE TIMES DAILY AS NEEDED FOR MUSCLE SPASM 07/01/17   Biagio Borg, MD  diazepam (VALIUM) 5 MG tablet Take 1 tablet (5 mg total) by mouth every 8 (eight) hours as needed for anxiety or muscle spasms. 10/09/17   Gerda Diss, DO  diclofenac sodium (VOLTAREN) 1 % GEL Apply topically to affected area qid 09/30/17   Gerda Diss, DO  fluticasone [facility]) 50 MCG/ACT nasal spray Place 1 spray into both nostrils daily. 08/09/17   Orma Flaming, MD  gabapentin (NEURONTIN) 300 MG capsule Take 300 mg by mouth daily as needed. Nerve pain 02/10/16   [provider]  KLOR-CON M20 20 MEQ tablet  06/02/17   [provider]  levothyroxine (SYNTHROID, LEVOTHROID) 100 MCG tablet TAKE 1 TABLET BY MOUTH ONCE DAILY 08/22/17   Marin Olp, MD  mirabegron ER (MYRBETRIQ) 25 MG TB24 tablet Take 25 mg by mouth daily.    [provider]  omeprazole (PRILOSEC) 20 MG capsule Take 1 capsule (20 mg total) by mouth daily before breakfast. HEART BURN 06/13/17   Marin Olp, MD  ondansetron (ZOFRAN) 4 MG tablet TAKE ONE TABLET BY MOUTH EVERY 8 HOURS AS NEEDED FOR NAUSEA OR VOMITING 10/03/17   Marin Olp, MD  POTASSIUM PO Take 1 tablet by mouth daily.    [provider]  REVLIMID 10 MG capsule Take 10 mg by mouth daily. 21 days on and 7 days off 05/16/12   [provider]  warfarin (COUMADIN) 5 MG tablet TAKE AS DIRECTED BY ANTICOAGULATION CLINIC 07/08/17   Marin Olp, MD    Family History Family History  Problem Relation Age of Onset  . Lymphoma Father 82  . Clotting disorder Mother        blood clots  . Lung disease Brother   . Diabetes Son   . Diabetes Daughter   . Diabetes Daughter   . Colon cancer Neg Hx   . Esophageal cancer Neg Hx   . Rectal cancer Neg Hx   . Stomach cancer Neg Hx     Social History Social History   Tobacco Use  . Smoking status: Former Smoker    Last attempt to quit:  02/15/1969    Years since quitting: 48.7  . Smokeless tobacco: Never Used  . Tobacco comment: quit 50 yrs  Substance Use Topics  . Alcohol use: No    Alcohol/week: 0.0 standard drinks  . Drug use: No     Allergies   Blood-group specific substance   Review of Systems Review of Systems  Constitutional: Negative for fever.  HENT: Negative for rhinorrhea and sore throat.   Eyes: Negative for redness.  Respiratory: Negative for cough.   Cardiovascular: Negative for chest pain.  Gastrointestinal: Negative for abdominal pain, diarrhea, nausea  and vomiting.  Genitourinary: Positive for testicular pain. Negative for dysuria, hematuria, penile pain, penile swelling and scrotal swelling.  Musculoskeletal: Positive for myalgias.  Skin: Negative for rash.  Neurological: Negative for headaches.     Physical Exam Updated Vital Signs BP (!) 97/53   Pulse (!) 59   Temp 99.2 F (37.3 C) (Oral)   Resp 16   SpO2 100%   Physical Exam  Constitutional: He appears well-developed and well-nourished.  HENT:  Head: Normocephalic and atraumatic.  Eyes: Conjunctivae are normal.  Neck: Normal range of motion. Neck supple.  Pulmonary/Chest: No respiratory distress.  Musculoskeletal: He exhibits tenderness.       Left hip: He exhibits tenderness. He exhibits normal range of motion, normal strength and no bony tenderness.       Lumbar back: He exhibits normal range of motion, no tenderness and no bony tenderness.       Left upper leg: He exhibits tenderness (Medially). He exhibits no bony tenderness and no swelling.  Patient with point tenderness in the fold of the left groin and inferiorly to the medial thigh.  No swelling.  Neurological: He is alert.  Skin: Skin is warm and dry.  Psychiatric: He has a normal mood and affect.  Nursing note and vitals reviewed.    ED Treatments / Results  Labs (all labs ordered are listed, but only abnormal results are displayed) Labs Reviewed    PROTIME-INR - Abnormal; Notable for the following components:      Result Value   Prothrombin Time 32.7 (*)    All other components within normal limits  COMPREHENSIVE METABOLIC PANEL - Abnormal; Notable for the following components:   Creatinine, Ser 1.42 (*)    Calcium 8.8 (*)    GFR calc non Af Amer 47 (*)    GFR calc Af Amer 54 (*)    All other components within normal limits  CBC    EKG None  Radiology US Scrotum W/doppler  Result Date: 10/19/2017 CLINICAL DATA:  Left testicular pain and left growing pain. EXAM: SCROTAL ULTRASOUND DOPPLER ULTRASOUND OF THE TESTICLES TECHNIQUE: Complete ultrasound examination of the testicles, epididymis, and other scrotal structures was performed. Color and spectral Doppler ultrasound were also utilized to evaluate blood flow to the testicles. COMPARISON:  None. FINDINGS: Right testicle Measurements: 4.0 x 1.9 x 2.6 cm. No mass or microlithiasis visualized. Left testicle Measurements: 3.9 x 1.8 x 3.0 cm. No mass or microlithiasis visualized. Right epididymis:  5 mm simple cyst noted, otherwise normal. Left epididymis:  Normal in size and appearance. Hydrocele:  Bilateral small hydrocele. Varicocele:  None visualized. Pulsed Doppler interrogation of both testes demonstrates normal low resistance arterial and venous waveforms bilaterally. IMPRESSION: Normal appearance of the testicles. Bilateral small hydrocele. Electronically Signed   By: Fidela Salisbury M.D.   On: 10/19/2017 16:25    Procedures Procedures (including critical care time)  Medications Ordered in ED Medications - No data to display   Initial Impression / Assessment and Plan / ED Course  I have reviewed the triage vital signs and the nursing notes.  Pertinent labs & imaging results that were available during my care of the patient were reviewed by me and considered in my medical decision making (see chart for details).     Patient seen and examined.  Reviewed work-up which  is benign.  I do not feel or visualize any signs of infection, signs of hernia.  Discussed patient with Dr. Jeanell Sparrow who will see.   Vital  signs reviewed and are as follows: BP (!) 97/53   Pulse (!) 59   Temp 99.2 F (37.3 C) (Oral)   Resp 16   SpO2 100%   8:37 PM patient discussed with and seen by Dr. Jeanell Sparrow.  Patient ambulated.  He has pain with bending over, however is otherwise ambulatory.  Discussed results and exam with patient and his wife at bedside.  At this point, we feel that patient is safe for discharge.  He is to follow-up with his doctor or return with any swelling, bruising, or worsening symptoms related to his left groin.  I discussed that he needs to take it easy and not do any heavy lifting, pushing, pulling over the next several days while the area heals.  He may use ice and Tylenol for pain.  Patient verbalizes understanding and agrees with plan.   Final Clinical Impressions(s) / ED Diagnoses   Final diagnoses:  Pain in left testicle   Patient with left groin pain which started after bending over today while lifting watermelons.  He had pain that radiated into his groin.  Ultrasound is unrevealing.  Patient does not have any masses or bruising on exam.  He has good pulses to his lower extremities and I do not suspect an aneurysm or dissection at this point.  I do not suspect fracture, hematoma.  Possibly groin pull or a small hernia.  Will treat conservatively at this time.  Discussed signs and symptoms which should warrant follow-up.   ED Discharge Orders    None       Carlisle Cater, Hershal Coria 10/19/17 2041    Pattricia Boss, MD 10/20/17 [phone]

## 2017-10-22 ENCOUNTER — Telehealth: Payer: Self-pay | Admitting: Family Medicine

## 2017-10-22 NOTE — Telephone Encounter (Signed)
Please thank him for updating [place]. Perhaps have them forward a copy of their findings to [place] and did they give him follow up precautions- do they want him to see [place] in a certain time frame?

## 2017-10-22 NOTE — Telephone Encounter (Signed)
Please Advise

## 2017-10-22 NOTE — Telephone Encounter (Signed)
Patient called after hours stating he has a rupture in his groin and thought it was a strain but the pain worsened when he worked and bent down. Patient went to the urgent care he just wanted Dr. Yong Channel to be aware. Clinical nurse for [facility] wanted to speak further to the patient to access however, patient declined and stated he would wait to speak to Dr. Ansel Bong team.

## 2017-10-23 ENCOUNTER — Ambulatory Visit (INDEPENDENT_AMBULATORY_CARE_PROVIDER_SITE_OTHER): Payer: Medicare Other | Admitting: General Practice

## 2017-10-23 DIAGNOSIS — Z7901 Long term (current) use of anticoagulants: Secondary | ICD-10-CM | POA: Diagnosis not present

## 2017-10-23 LAB — POCT INR: INR: 3.9 — AB (ref 2.0–3.0)

## 2017-10-23 NOTE — Telephone Encounter (Signed)
Please call pt and schedule follow up with Dr. Yong Channel tomorrow or Friday. Pt was suppose to follow up 3 days after ED visit which was 8/17.

## 2017-10-23 NOTE — Telephone Encounter (Signed)
Patient is scheduled for Friday at 11:30am

## 2017-10-23 NOTE — Patient Instructions (Addendum)
Pre visit review using our clinic review tool, if applicable. No additional management support is needed unless otherwise documented below in the visit note.  Skip coumadin today and tomorrow and then change dosage and take 1 tablet daily except 1/2 tablet on Monday/Wednesdays and Saturdays.  Re-check in 4 weeks.

## 2017-10-23 NOTE — Progress Notes (Signed)
I have reviewed and agree with note, evaluation, plan.   Haron Beilke, MD  

## 2017-10-25 ENCOUNTER — Ambulatory Visit: Payer: Medicare Other | Admitting: Family Medicine

## 2017-10-25 ENCOUNTER — Telehealth: Payer: Self-pay

## 2017-10-25 DIAGNOSIS — Z0289 Encounter for other administrative examinations: Secondary | ICD-10-CM

## 2017-10-25 NOTE — Telephone Encounter (Signed)
Called patient to see if he would make it to his appointment. I left patient a message to call office back if he could.

## 2017-10-29 ENCOUNTER — Encounter: Payer: Self-pay | Admitting: Family Medicine

## 2017-10-31 NOTE — Progress Notes (Signed)
All labs are reassuring.  We will follow-up at his visit next week.

## 2017-11-01 NOTE — Telephone Encounter (Signed)
Patient came into office and was speaking with Gwyndolyn Saxon at the front desk about a no show letter. Gwyndolyn Saxon asked me to come up front to speak with the patient. I came up and greeted the patient and asked how he was? He stated he did not know and I stated well I hope that I can help with that. Patient questioned who sends out letters from [facility] and I stated that the letters are composed by [place] however, I mail out the letters for no shows if I see a no show appointment on the schedule so the patient is made aware/reminded of the policy.  The patient stated that the letter was very nasty and he did not appreciate it. I explained once again that I did not compose the contents of the letter and that it is not [place] intention to be "nasty" however, it is to make the patient aware of what is to come for no shows and the fee associated with it. I asked what part was offensive in the letter and he stated he could not recall, it just was. I apologized for how the letter made him feel and that our intention is to ensure that patients are aware of the policy and fee that follows the letter.   I asked the patient if he would like me to reschedule the appointment and he declined and said that he does not appreciate my attitude. I explained that I do not have an attitude however, would like to work towards a solution and did not appreciate him being rude. I stated Dr. Yong Channel would like to know how his health is doing and for him to reschedule his appointment. Patient stated he may be looking for another provider because although he scheduled the appointment he did not need to come in and should not have received a letter. I asked was there anything additional I could help him with and to find a solution for his visit to the office and he stated no and exited the office. I stated have a great day and I would document our discussion and update Dr. Yong Channel that he declines to reschedule his ED follow up.

## 2017-11-01 NOTE — Telephone Encounter (Signed)
Please advise 

## 2017-11-02 NOTE — Telephone Encounter (Signed)
I know we had discussed possible dismissal but I thought this over some more. Patient did not cuss or threaten. Seems he was bothered by our policy primarily. Lea or Marcene Brawn- can you please call him back?   I think we need to understand why he made an appointment even though he didn't think it was necessary. Also if he made appointment and knew he wasn't calling why didn't he call us to update [place]? Is he willing to comply with our policies for that appointment and future appointments? Is he willing to reschedule?  There would be ripple effects from this as well- as he is being cared for by Dr. Paulla Fore and has upcoming appointment on the 24th.   If he does not want to comply with our policies, then I would then activate dismissal. Would need to make sure to cancel appointment with Dr. Paulla Fore and make Dr. Paulla Fore aware of this decision if so.

## 2017-11-06 ENCOUNTER — Ambulatory Visit: Payer: Medicare Other | Admitting: Sports Medicine

## 2017-11-08 ENCOUNTER — Encounter: Payer: Self-pay | Admitting: Sports Medicine

## 2017-11-08 ENCOUNTER — Ambulatory Visit (INDEPENDENT_AMBULATORY_CARE_PROVIDER_SITE_OTHER): Payer: Medicare Other | Admitting: Sports Medicine

## 2017-11-08 ENCOUNTER — Other Ambulatory Visit: Payer: Self-pay | Admitting: Sports Medicine

## 2017-11-08 VITALS — BP 102/62 | HR 63 | Ht 68.5 in | Wt 197.6 lb

## 2017-11-08 DIAGNOSIS — M25552 Pain in left hip: Secondary | ICD-10-CM

## 2017-11-08 DIAGNOSIS — C9001 Multiple myeloma in remission: Secondary | ICD-10-CM

## 2017-11-08 DIAGNOSIS — M25551 Pain in right hip: Secondary | ICD-10-CM

## 2017-11-08 DIAGNOSIS — M5136 Other intervertebral disc degeneration, lumbar region: Secondary | ICD-10-CM

## 2017-11-08 DIAGNOSIS — M549 Dorsalgia, unspecified: Secondary | ICD-10-CM | POA: Diagnosis not present

## 2017-11-08 DIAGNOSIS — S76012A Strain of muscle, fascia and tendon of left hip, initial encounter: Secondary | ICD-10-CM

## 2017-11-08 DIAGNOSIS — Z7901 Long term (current) use of anticoagulants: Secondary | ICD-10-CM

## 2017-11-08 DIAGNOSIS — G8929 Other chronic pain: Secondary | ICD-10-CM

## 2017-11-08 DIAGNOSIS — M160 Bilateral primary osteoarthritis of hip: Secondary | ICD-10-CM | POA: Insufficient documentation

## 2017-11-08 MED ORDER — NITROGLYCERIN 0.2 MG/HR TD PT24: MEDICATED_PATCH | TRANSDERMAL | 1 refills | Status: DC

## 2017-11-08 NOTE — Patient Instructions (Signed)
We are going to refer you to physical therapy with Lauren who is here in our building.  Stop your cyclobenzaprine. Continue with your diazepam, is okay to use half tablet    Nitroglycerin Protocol   Apply 1/4 nitroglycerin patch to affected area daily.  Change position of patch within the affected area every 24 hours.  You may experience a headache during the first 1-2 weeks of using the patch, these should subside.  If you experience headaches after beginning nitroglycerin patch treatment, you may take your preferred over the counter pain reliever.  Another side effect of the nitroglycerin patch is skin irritation or rash related to patch adhesive.  Please notify our office if you develop more severe headaches or rash, and stop the patch.  Tendon healing with nitroglycerin patch may require 12 to 24 weeks depending on the extent of injury.  Men should not use if taking Viagra, Cialis, or Levitra.   Do not use if you have migraines or rosacea.

## 2017-11-08 NOTE — Progress Notes (Signed)
Rodney [name]. Rodney [name], Rodney [name] at [name]  Rodney [name] - 77 y.o. male MRN [id number]  Date of birth: 08/19/1940  Visit Date: 11/08/2017  PCP: Rodney Olp, MD   Referred by: Rodney Olp, MD  Scribe(s) for today's visit: Rodney [name], CMA  SUBJECTIVE:  Rodney [name] is here for No chief complaint on file. 07/01/2017: His B hip (R>L)symptoms INITIALLY: Began about 5 years ago after falling due to disc degeneration secondary to multiple myeloma Described as 8/10 aching pain that can intermittently become sharp, nonradiating Worsened with laying down for prolonged periods of time Improved with nothing Additional associated symptoms include: no radiating pain into B LEs but tingling noted in B Les   At this time symptoms are worsening compared to onset w/ increased pain He has been doing as much mov't as possible but hasn't tried any formal treatment. Had an XR of R hip on 07/29/15  07/19/2017: Compared to the last office visit, his previously described symptoms show no change. Still having lateral hip pain in both hips, R>L.  Current symptoms are severe (8/10) & are radiating to the groin. Pain is worse in the mornings and hen first getting it.  He has been taking Tylenol with minimal relief.  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  09/30/17 Compared to the last office visit on 07/19/17, his previously described back and B hip pain (L>R) symptoms are worsening Current symptoms are ranging from mild to severe depending on activity & are radiating to L lateral thigh.  He states that sitting at rest he has 2/10 pain but pain increases w/ lumbar flexion and also when attempting to cross his L leg over his R.  He states that his knees are also giving out on him intermittently. He has been taking Tylenol prn. L-spine and T-spine XR - 07/19/17 B hip XR -  07/01/17 L-spine MRI ordered but not completed  10/09/2017: Compared to the last office visit, his previously described symptoms are worsening.  Current symptoms are mild-severe depending on activity level & are radiating to the lateral aspect of the L thigh.  He has been taking Tylenol prn but hasn't had any in the past 2 days.  He had MRI 10/07/2017, he reports that this caused increased pain d/t lying still.   11/08/2017: Compared to the last office visit, his previously described symptoms show no change. He c/o throbbing bilateral lower back pain. He reports difficulty standing from the ground.  Current symptoms are mild-severe depending on activity & are radiating to the lateral thigh and gluteal region.  He continues to take Tylenol prn. He received IM Depo-Medrol 80 mg injection 10/09/17 and tolerated well. Diazepam 5 mg was prescribed. He reports no relief with Depo-Medrol injection.   He was seen in the ED 10/19/17 for L inguinal and testicular pain. He reports that pain has resolved.  He reports dizziness when first getting up in the mornings. No falls d/t dizziness. He denies nausea or increased sweating, chest pain, SOB.   REVIEW OF SYSTEMS: Denies night time disturbances. Denies fevers, chills, or night sweats. Denies unexplained weight loss. Reports personal history of cancer. Denies changes in bowel or bladder habits. Denies recent unreported falls. Denies new or worsening dyspnea or wheezing. Denies headaches or dizziness.  Reports numbness, tingling or weakness  In the extremities.  Denies dizziness or presyncopal episodes Denies lower extremity  edema   HISTORY:  Prior history reviewed and updated per electronic medical record.  Social History   Occupational History  . Occupation: retired  Tobacco Use  . Smoking status: Former Smoker    Last attempt to quit: 02/15/1969    Years since quitting: 48.7  . Smokeless tobacco: Never Used  . Tobacco comment: quit 50 yrs   Substance and Sexual Activity  . Alcohol use: No    Alcohol/week: 0.0 standard drinks  . Drug use: No  . Sexual activity: Yes   Social History   Social History Narrative   Married   Regular exercise-yes    Past Medical History:  Diagnosis Date  . Anemia 02-16-11   01-05-11- post surgery-Transfusions x 4 units  . Arthritis   . Bone pain 02/18/2013  . Clotting disorder ([place])   . Complication of anesthesia 02-16-11   Pt. speaks of awakening during the surgery from back  surgery  . Degenerative disc disease 02-16-11   01-05-11 L3 fusion done  . Esophageal reflux 05/31/2008   Qualifier: Diagnosis of  By: Arnoldo Morale MD, Balinda Quails   . Hernia 02-16-11   left inguinal hernia at present  . Hypothyroidism 02/01/2014  . Multiple myeloma 02-16-11   suspected tumor  left sacral  . Pancreatitis   . Personal history of traumatic fracture 07/10/2012  . Prostate cancer ([place])   . Pulmonary embolism ([place])   . Second degree atrioventricular block 11/26/2012  . Shortness of breath 02-16-11   hx. Pulmonary emboli x2 (9 yrs/ 3'12 -last)   Past Surgical History:  Procedure Laterality Date  . APPENDECTOMY    . CHOLECYSTECTOMY  04/06/2010   laparoscopic-inflammation with stones  . INGUINAL HERNIA REPAIR  02/20/2011   Procedure: HERNIA REPAIR INGUINAL ADULT;  Surgeon: Earnstine Regal, MD;  Location: [facility];  Service: General;  Laterality: Left;  Repair Left Inguinal Hernia with Mesh  . LUMBAR DISC SURGERY  02-16-11    01-05-11 Lumbar surgery L3(complicated by loss of blood volume)/ 01-09-11 then Lumbar fusion done with retained  hardware   . PROSTATE SURGERY Bilateral 2011  . TONSILLECTOMY AND ADENOIDECTOMY     family history includes Clotting disorder in his mother; Diabetes in his daughter, daughter, and son; Lung disease in his brother; Lymphoma (age of onset: 45) in his father. There is no history of Colon cancer, Esophageal cancer, Rectal cancer, or Stomach cancer.  DATA OBTAINED & REVIEWED:  No  results for input(s): HGBA1C, LABURIC, CREATINE in the last 8760 hours. Marland Kitchen MRI of the lumbar spine and pelvis on 10/07/2017: 1. L3 corpectomy and L2-L4 posterior instrumented fusion. No apparent hardware related complication. 2. Left hemipelvis lytic lesion with interval near complete fatty and small cyst replacement. No new lesion identified. 3. Progression of lumbar spondylosis at the L4-5 and L5-S1 levels. 4. Moderate bilateral L4-5 and L5-S1 canal stenosis. Moderate bilateral L4-5 and mild bilateral L5-S1 foraminal stenosis.  1. Moderate left greater trochanteric bursitis. 2. Tear of the left gluteus minimus tendon. 3. Large fat attenuating intramedullary bone lesion in the left side of the measuring approximately 5.5 x 3.9 x 4.1 cm with cystic areas within the bone lesion with the overall appearance most consistent with treated malignancy. 4. 8 mm T2 hyperintense and T1 hypointense bone lesion in the proximal left femoral diaphysis likely reflecting treated bone lesion. Recommend correlation with laboratory values and follow-up MRI in 3-6 months. .   OBJECTIVE:  VS:  HT:5' 8.5" (174 cm)   WT:197 lb 9.6 oz (  89.6 kg)  BMI:29.6    BP:102/62  HR:63bpm  TEMP: ( )  RESP:94 %   PHYSICAL EXAM: CONSTITUTIONAL: Well-developed, Well-nourished and In no acute distress PSYCHIATRIC: Alert & appropriately interactive. and Not depressed or anxious appearing. RESPIRATORY: No increased work of breathing and Trachea Midline EYES: Pupils are equal., EOM intact without nystagmus. and No scleral icterus.  VASCULAR EXAM: Warm and well perfused NEURO: unremarkable  MSK Exam: Back and hips  Well aligned.  No significant scoliotic curvature.  He walks with an slightly wide-based and antalgic gait but this is improved.   Well-healed postsurgical incision of the midline.  No overlying bruising or ecchymosis. No focal bony tenderness TTP over Left greater than right SI joint.  Mildly over the left gluteal  musculature.  Minimal pain over the greater trochanter. Non tender over Midline spine   SPECIALITY TESTING:  He is able to heel and toe walk although slightly painful.  Positive straight leg raise on the left, minimal on the right.  Good internal and external rotation bilateral hips    ASSESSMENT   1. Long term (current) use of anticoagulants   2. Chronic bilateral back pain, unspecified back location   3. Pain of both hip joints   4. Multiple myeloma in remission ([place])   5. Other intervertebral disc degeneration, lumbar region   6. Primary osteoarthritis of both hips   7. Tear of left gluteus medius tendon     PLAN:  Pertinent additional documentation may be included in corresponding procedure notes, imaging studies, problem based documentation and patient instructions.  Procedures:  . None  Medications:  Meds ordered this encounter  Medications  . nitroGLYCERIN (NITRODUR - DOSED IN MG/24 HR) 0.2 mg/hr patch    Sig: Place 1/4 to 1/2 of a patch over affected region. Remove and replace once daily.  Slightly alter skin placement daily    Dispense:  30 patch    Refill:  1    For musculoskeletal purposes.  Okay to cut patch.   Discussion/Instructions: No problem-specific Assessment & Plan notes found for this encounter.  Marland Kitchen He is continuing to actually show good improvement in symptoms.  An acute flareup of groin pain previously which may be related to either lumbar pain or possible hip intra-articular pathology once again MRIs were reassuring.  If any localizing pain to the lateral hip will consider injection at this time we will start nitroglycerin protocol.. . Discussed options with the patient today including biologic treatment with topical nitroglycerin. Patient has no contraindications & understands the risks, benefits and intentions of treatment. Emphasized the importance of rotating sites as well as appropriate and expected adverse reactions including orthostasis, headache,  adhesive sensitivity. Begin with 1/4 patch to the affected area. Okay to titrate to half a patch as tolerated. Marland Kitchen Referral placed For physical therapy. . Discussed red flag symptoms that warrant earlier emergent evaluation and patient voices understanding. . Activity modifications and the importance of avoiding exacerbating activities (limiting pain to no more than a 4 / 10 during or following activity) recommended and discussed. . >50% of this 25 minutes minute visit spent in direct patient counseling and/or coordination of care. Discussion was focused on education regarding the in discussing the pathoetiology and anticipated clinical course of the above condition.  Follow-up:  . Return in about 6 weeks (around 12/20/2017) for repeat clinical exam.   . If any lack of improvement consider: Diagnostic and therapeutic intra-articular or greater trochanteric injection     CMA/ATC served as  scribe during this visit. History, Physical, and Plan performed by medical provider. Documentation and orders reviewed and attested to.      Gerda Diss, [facility] Physician

## 2017-11-11 NOTE — Telephone Encounter (Signed)
Last OV 9/6, last refill 8/7 #30/0.

## 2017-11-18 ENCOUNTER — Ambulatory Visit (INDEPENDENT_AMBULATORY_CARE_PROVIDER_SITE_OTHER): Payer: Medicare Other | Admitting: General Practice

## 2017-11-18 DIAGNOSIS — Z7901 Long term (current) use of anticoagulants: Secondary | ICD-10-CM

## 2017-11-18 LAB — POCT INR: INR: 2.3 (ref 2.0–3.0)

## 2017-11-18 NOTE — Patient Instructions (Signed)
Pre visit review using our clinic review tool, if applicable. No additional management support is needed unless otherwise documented below in the visit note.  Continue to take 1 tablet daily except 1/2 tablet on Monday/Wednesdays and Saturdays.  Re-check in 4 weeks.

## 2017-11-19 ENCOUNTER — Encounter: Payer: Self-pay | Admitting: Sports Medicine

## 2017-11-19 ENCOUNTER — Telehealth: Payer: Self-pay | Admitting: Sports Medicine

## 2017-11-19 NOTE — Telephone Encounter (Signed)
Patient arrived to office and wanted Dr Paulla Fore to know, "The patch med you gave me worked well. Thank you"

## 2017-11-19 NOTE — Telephone Encounter (Signed)
Noted  

## 2017-11-20 ENCOUNTER — Ambulatory Visit: Payer: Medicare Other

## 2017-11-21 DIAGNOSIS — R6889 Other general symptoms and signs: Secondary | ICD-10-CM | POA: Diagnosis not present

## 2017-11-21 DIAGNOSIS — C9000 Multiple myeloma not having achieved remission: Secondary | ICD-10-CM | POA: Diagnosis not present

## 2017-12-16 ENCOUNTER — Ambulatory Visit: Payer: Medicare Other

## 2017-12-20 ENCOUNTER — Other Ambulatory Visit: Payer: Self-pay

## 2017-12-20 ENCOUNTER — Encounter: Payer: Self-pay | Admitting: Sports Medicine

## 2017-12-20 ENCOUNTER — Telehealth: Payer: Self-pay

## 2017-12-20 ENCOUNTER — Ambulatory Visit (INDEPENDENT_AMBULATORY_CARE_PROVIDER_SITE_OTHER): Payer: Medicare Other | Admitting: Sports Medicine

## 2017-12-20 VITALS — BP 100/60 | HR 65 | Ht 68.5 in | Wt 195.0 lb

## 2017-12-20 DIAGNOSIS — S76012A Strain of muscle, fascia and tendon of left hip, initial encounter: Secondary | ICD-10-CM

## 2017-12-20 DIAGNOSIS — M160 Bilateral primary osteoarthritis of hip: Secondary | ICD-10-CM | POA: Diagnosis not present

## 2017-12-20 DIAGNOSIS — C9001 Multiple myeloma in remission: Secondary | ICD-10-CM

## 2017-12-20 DIAGNOSIS — M25551 Pain in right hip: Secondary | ICD-10-CM

## 2017-12-20 DIAGNOSIS — M545 Low back pain, unspecified: Secondary | ICD-10-CM

## 2017-12-20 DIAGNOSIS — M25552 Pain in left hip: Secondary | ICD-10-CM

## 2017-12-20 DIAGNOSIS — R634 Abnormal weight loss: Secondary | ICD-10-CM

## 2017-12-20 DIAGNOSIS — Z7901 Long term (current) use of anticoagulants: Secondary | ICD-10-CM

## 2017-12-20 DIAGNOSIS — L989 Disorder of the skin and subcutaneous tissue, unspecified: Secondary | ICD-10-CM

## 2017-12-20 DIAGNOSIS — M5136 Other intervertebral disc degeneration, lumbar region: Secondary | ICD-10-CM | POA: Diagnosis not present

## 2017-12-20 MED ORDER — BACLOFEN 10 MG PO TABS: 10.0000 mg | ORAL_TABLET | Freq: Three times a day (TID) | ORAL | 1 refills | Status: DC | PRN

## 2017-12-20 NOTE — Assessment & Plan Note (Signed)
Doing well with nitroglycerin protocol.  Continue with home therapeutic exercises and start physical therapy

## 2017-12-20 NOTE — Progress Notes (Signed)
Rodney [name]. Rodney [name], [place] [facility] [place]  Rodney [name] - 77 y.o. male MRN [id number]  Date of birth: 1940-05-03  Visit Date: 12/20/2017  PCP: Marin Olp, MD   Referred by: Marin Olp, MD  Scribe(s) for today's visit: Rodney [name], CMA  SUBJECTIVE:  Rodney [name] is here for Follow-up (LBP, B hip pain) 07/01/2017: His B hip (R>L)symptoms INITIALLY: Began about 5 years ago after falling due to disc degeneration secondary to multiple myeloma Described as 8/10 aching pain that can intermittently become sharp, nonradiating Worsened with laying down for prolonged periods of time Improved with nothing Additional associated symptoms include: no radiating pain into B LEs but tingling noted in B Les   At this time symptoms are worsening compared to onset w/ increased pain He has been doing as much mov't as possible but hasn't tried any formal treatment. Had an XR of R hip on 07/29/15  07/19/2017: Compared to the last office visit, his previously described symptoms show no change. Still having lateral hip pain in both hips, R>L.  Current symptoms are severe (8/10) & are radiating to the groin. Pain is worse in the mornings and hen first getting it.  He has been taking Tylenol with minimal relief.  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  09/30/17 Compared to the last office visit on 07/19/17, his previously described back and B hip pain (L>R) symptoms are worsening Current symptoms are ranging from mild to severe depending on activity & are radiating to L lateral thigh.  He states that sitting at rest he has 2/10 pain but pain increases w/ lumbar flexion and also when attempting to cross his L leg over his R.  He states that his knees are also giving out on him intermittently. He has been taking Tylenol prn. L-spine and T-spine XR - 07/19/17 B hip XR -  07/01/17 L-spine MRI ordered but not completed  10/09/2017: Compared to the last office visit, his previously described symptoms are worsening.  Current symptoms are mild-severe depending on activity level & are radiating to the lateral aspect of the L thigh.  He has been taking Tylenol prn but hasn't had any in the past 2 days.  He had MRI 10/07/2017, he reports that this caused increased pain d/t lying still.   11/08/2017: Compared to the last office visit, his previously described symptoms show no change. He c/o throbbing bilateral lower back pain. He reports difficulty standing from the ground.  Current symptoms are mild-severe depending on activity & are radiating to the lateral thigh and gluteal region.  He continues to take Tylenol prn. He received IM Depo-Medrol 80 mg injection 10/09/17 and tolerated well. Diazepam 5 mg was prescribed. He reports no relief with Depo-Medrol injection.  He was seen in the ED 10/19/17 for L inguinal and testicular pain. He reports that pain has resolved.  He reports dizziness when first getting up in the mornings. No falls d/t dizziness. He denies nausea or increased sweating, chest pain, SOB.   12/20/2017: Compared to the last office visit, his previously described symptoms are improving. Pain flares up when not using his medication. He still has trouble with his back but his hips are much improved, especially on work days. He feels like it is easier to get up and going in the mornings, he doesn't feel like he will "cry out in pain". He has  the most pain when reaching down to pick something up from the ground.  Current symptoms are mild-severe depending on activity level & are non-radiating. He denies pain in the gluteal region, groin, legs. He reports occasional hamstring pain.  He has been following Nitro Protocol and reports occasional dull HA. He does not use them daily, maybe every 2-3 days.    REVIEW OF SYSTEMS: Reports night time disturbances. Denies  fevers, chills, or night sweats. Reports weight loss, 2-3 lbs wt loss over the past month. He has been trying to gain wt over the past week.  Reports personal history of cancer. Denies changes in bowel or bladder habits. Denies recent unreported falls. Denies new or worsening dyspnea or wheezing. Reports headaches.  Denies numbness, tingling or weakness  In the extremities.  Denies dizziness or presyncopal episodes Denies lower extremity edema   HISTORY:  Prior history reviewed and updated per electronic medical record.  Social History   Occupational History  . Occupation: retired  Tobacco Use  . Smoking status: Former Smoker    Last attempt to quit: 02/15/1969    Years since quitting: 48.8  . Smokeless tobacco: Never Used  . Tobacco comment: quit 50 yrs  Substance and Sexual Activity  . Alcohol use: No    Alcohol/week: 0.0 standard drinks  . Drug use: No  . Sexual activity: Yes   Social History   Social History Narrative   Married   Regular exercise-yes    Past Medical History:  Diagnosis Date  . Anemia 02-16-11   01-05-11- post surgery-Transfusions x 4 units  . Arthritis   . Bone pain 02/18/2013  . Clotting disorder ([place])   . Complication of anesthesia 02-16-11   Pt. speaks of awakening during the surgery from back  surgery  . Degenerative disc disease 02-16-11   01-05-11 L3 fusion done  . Esophageal reflux 05/31/2008   Qualifier: Diagnosis of  By: Arnoldo Morale MD, Balinda Quails   . Hernia 02-16-11   left inguinal hernia at present  . Hypothyroidism 02/01/2014  . Multiple myeloma 02-16-11   suspected tumor  left sacral  . Pancreatitis   . Personal history of traumatic fracture 07/10/2012  . Prostate cancer ([place])   . Pulmonary embolism ([place])   . Second degree atrioventricular block 11/26/2012  . Shortness of breath 02-16-11   hx. Pulmonary emboli x2 (9 yrs/ 3'12 -last)   Past Surgical History:  Procedure Laterality Date  . APPENDECTOMY    . CHOLECYSTECTOMY  04/06/2010    laparoscopic-inflammation with stones  . INGUINAL HERNIA REPAIR  02/20/2011   Procedure: HERNIA REPAIR INGUINAL ADULT;  Surgeon: Earnstine Regal, MD;  Location: [facility];  Service: General;  Laterality: Left;  Repair Left Inguinal Hernia with Mesh  . LUMBAR DISC SURGERY  02-16-11    01-05-11 Lumbar surgery L3(complicated by loss of blood volume)/ 01-09-11 then Lumbar fusion done with retained  hardware   . PROSTATE SURGERY Bilateral 2011  . TONSILLECTOMY AND ADENOIDECTOMY     family history includes Clotting disorder in his mother; Diabetes in his daughter, daughter, and son; Lung disease in his brother; Lymphoma (age of onset: 58) in his father. There is no history of Colon cancer, Esophageal cancer, Rectal cancer, or Stomach cancer.  DATA OBTAINED & REVIEWED:  No results for input(s): HGBA1C, LABURIC, CREATINE in the last 8760 hours. Marland Kitchen MRI of the lumbar spine and pelvis on 10/07/2017: 1. L3 corpectomy and L2-L4 posterior instrumented fusion. No apparent hardware related complication. 2. Left hemipelvis  lytic lesion with interval near complete fatty and small cyst replacement. No new lesion identified. 3. Progression of lumbar spondylosis at the L4-5 and L5-S1 levels. 4. Moderate bilateral L4-5 and L5-S1 canal stenosis. Moderate bilateral L4-5 and mild bilateral L5-S1 foraminal stenosis.  1. Moderate left greater trochanteric bursitis. 2. Tear of the left gluteus minimus tendon. 3. Large fat attenuating intramedullary bone lesion in the left side of the measuring approximately 5.5 x 3.9 x 4.1 cm with cystic areas within the bone lesion with the overall appearance most consistent with treated malignancy. 4. 8 mm T2 hyperintense and T1 hypointense bone lesion in the proximal left femoral diaphysis likely reflecting treated bone lesion. Recommend correlation with laboratory values and follow-up MRI in 3-6 months. .   OBJECTIVE:  VS:  HT:5' 8.5" (174 cm)   WT:195 lb (88.5 kg)  BMI:29.21    BP:100/60   HR:65bpm  TEMP: ( )  RESP:95 %   Wt Readings from Last 3 Encounters:  12/20/17 195 lb (88.5 kg)  11/08/17 197 lb 9.6 oz (89.6 kg)  10/09/17 201 lb 12.8 oz (91.5 kg)    PHYSICAL EXAM: CONSTITUTIONAL: Well-developed, Well-nourished and In no acute distress PSYCHIATRIC: Alert & appropriately interactive. and Not depressed or anxious appearing. RESPIRATORY: No increased work of breathing and Trachea Midline EYES: Pupils are equal., EOM intact without nystagmus. and No scleral icterus.  VASCULAR EXAM: Warm and well perfused NEURO: unremarkable  MSK Exam: Back and hips  Well aligned.  No significant scoliotic curvature.  He walks with an slightly wide-based and antalgic gait but this is improved.   Well-healed postsurgical incision of the midline.  No overlying bruising or ecchymosis. No focal bony tenderness TTP over Left greater than right SI joint.  Mildly over the left gluteal musculature.  Minimal pain over the greater trochanter. Non tender over Midline spine   SPECIALITY TESTING:  He is able to heel and toe walk although slightly painful.  Positive straight leg raise on the left, minimal on the right.  Good internal and external rotation bilateral hips    ASSESSMENT   1. Long term (current) use of anticoagulants   2. Multiple myeloma in remission ([place])   3. Other intervertebral disc degeneration, lumbar region   4. Primary osteoarthritis of both hips   5. Pain of both hip joints   6. Tear of left gluteus medius tendon   7. Weight loss   8. Back pain, lumbosacral     PLAN:  Pertinent additional documentation may be included in corresponding procedure notes, imaging studies, problem based documentation and patient instructions.  Procedures:  . None  Medications:  Meds ordered this encounter  Medications  . baclofen (LIORESAL) 10 MG tablet    Sig: Take 1 tablet (10 mg total) by mouth 3 (three) times daily as needed for muscle spasms.    Dispense:  60 tablet     Refill:  1   Discussion/Instructions: Tear of left gluteus medius tendon Doing well with nitroglycerin protocol.  Continue with home therapeutic exercises and start physical therapy  Back pain, lumbosacral Fairly severe lumbosacral and thoracic pain.  Trial of muscle relaxer and repeat physical therapy with trial of dry needling if appropriate.  Marland Kitchen He is continuing to actually show good improvement in symptoms.  An acute flareup of groin pain previously which may be related to either lumbar pain or possible hip intra-articular pathology once again MRIs were reassuring.  If any localizing pain to the lateral hip will consider injection at  this time and has had good improvements in hip pain with the nitroglycerin protocol.  We will continue this at this time. Marland Kitchen His back pain continues to be the biggest issue for him and try baclofen as a muscle relaxer to see if this is beneficial.  We will need to consider discontinuing gabapentin decreasing the dose if he continues to require this with baclofen.  He does report using the gabapentin more as a sleep aid than anything. . Was again recommended following up with physical therapy.  The referral has been placed. . Discussed red flag symptoms that warrant earlier emergent evaluation and patient voices understanding. . Activity modifications and the importance of avoiding exacerbating activities (limiting pain to no more than a 4 / 10 during or following activity) recommended and discussed. . >50% of this 25 minutes minute visit spent in direct patient counseling and/or coordination of care. Discussion was focused on education regarding the in discussing the pathoetiology and anticipated clinical course of the above condition.  Follow-up:  . Return in about 3 months (around 03/22/2018) for repeat clinical exam.      CMA/ATC served as scribe during this visit. History, Physical, and Plan performed by medical provider. Documentation and orders reviewed  and attested to.      Gerda Diss, [facility] Physician

## 2017-12-20 NOTE — Telephone Encounter (Signed)
Patient in office today asking for refill of omeprazole (PRILOSEC) 20 MG capsule.   Per OV note 06/13/2017:  Esophageal reflux Omeprazole 20mg  twice a day for years due to ulcer history. I advised trial ofonce a day in AM. Need to reduce for QT prolongation risk anyway.  On long term PPI but b12 checked within a year and on b12 supplement 1000 mcg   Patient request med be filled for 2 tablets daily  Please advise

## 2017-12-20 NOTE — Assessment & Plan Note (Signed)
Fairly severe lumbosacral and thoracic pain.  Trial of muscle relaxer and repeat physical therapy with trial of dry needling if appropriate.

## 2017-12-21 NOTE — Telephone Encounter (Signed)
May refill as BID due to poor control. If this fails to improve symptoms needs to return to care

## 2017-12-25 ENCOUNTER — Ambulatory Visit (INDEPENDENT_AMBULATORY_CARE_PROVIDER_SITE_OTHER): Payer: Medicare Other | Admitting: General Practice

## 2017-12-25 DIAGNOSIS — Z7901 Long term (current) use of anticoagulants: Secondary | ICD-10-CM | POA: Diagnosis not present

## 2017-12-25 LAB — POCT INR: INR: 2 (ref 2.0–3.0)

## 2017-12-25 MED ORDER — OMEPRAZOLE 20 MG PO CPDR: 20.0000 mg | DELAYED_RELEASE_CAPSULE | Freq: Two times a day (BID) | ORAL | 0 refills | Status: DC

## 2017-12-25 NOTE — Progress Notes (Signed)
I have reviewed and agree with note, evaluation, plan.   Michale Emmerich, MD  

## 2017-12-25 NOTE — Addendum Note (Signed)
Addended by: Terence Lux B on: 12/25/2017 08:26 AM   Modules accepted: Orders

## 2017-12-25 NOTE — Patient Instructions (Addendum)
Pre visit review using our clinic review tool, if applicable. No additional management support is needed unless otherwise documented below in the visit note.  Continue to take 1 tablet daily except 1/2 tablet on Monday/Wednesdays and Saturdays.  Re-check in 4 weeks.

## 2017-12-25 NOTE — Telephone Encounter (Signed)
New RX sent to [facility].  

## 2018-01-07 ENCOUNTER — Other Ambulatory Visit: Payer: Self-pay | Admitting: Family Medicine

## 2018-01-09 ENCOUNTER — Other Ambulatory Visit: Payer: Self-pay | Admitting: General Practice

## 2018-01-22 ENCOUNTER — Ambulatory Visit (INDEPENDENT_AMBULATORY_CARE_PROVIDER_SITE_OTHER): Payer: Medicare Other | Admitting: General Practice

## 2018-01-22 DIAGNOSIS — Z7901 Long term (current) use of anticoagulants: Secondary | ICD-10-CM

## 2018-01-22 LAB — POCT INR: INR: 1.6 — AB (ref 2.0–3.0)

## 2018-01-22 NOTE — Progress Notes (Signed)
I have reviewed and agree with note, evaluation, plan.   Stephen Hunter, MD  

## 2018-01-22 NOTE — Patient Instructions (Signed)
Pre visit review using our clinic review tool, if applicable. No additional management support is needed unless otherwise documented below in the visit note.  Take 1 tablet today(11/20) and 1 1/2 tablets tomorrow (11/21) and then continue to take 1 tablet daily except 1/2 tablet on Monday/Wednesdays and Saturdays.  Re-check in 2 to 3 weeks.

## 2018-02-05 ENCOUNTER — Ambulatory Visit (INDEPENDENT_AMBULATORY_CARE_PROVIDER_SITE_OTHER): Payer: Medicare Other | Admitting: General Practice

## 2018-02-05 ENCOUNTER — Ambulatory Visit: Payer: Medicare Other

## 2018-02-05 DIAGNOSIS — Z7901 Long term (current) use of anticoagulants: Secondary | ICD-10-CM

## 2018-02-05 LAB — POCT INR: INR: 1.9 — AB (ref 2.0–3.0)

## 2018-02-05 NOTE — Patient Instructions (Addendum)
Pre visit review using our clinic review tool, if applicable. No additional management support is needed unless otherwise documented below in the visit note.  Take 1 tablet today(12/4) and and then change dosage and take 1 tablet daily except 1/2 tablet on Mondayand Saturdays.  Re-check in 4 weeks.

## 2018-02-05 NOTE — Progress Notes (Signed)
I have reviewed and agree with note, evaluation, plan.   Necole Minassian, MD  

## 2018-02-20 DIAGNOSIS — C9000 Multiple myeloma not having achieved remission: Secondary | ICD-10-CM | POA: Diagnosis not present

## 2018-02-20 DIAGNOSIS — R6889 Other general symptoms and signs: Secondary | ICD-10-CM | POA: Diagnosis not present

## 2018-02-20 DIAGNOSIS — Z5111 Encounter for antineoplastic chemotherapy: Secondary | ICD-10-CM | POA: Diagnosis not present

## 2018-03-12 ENCOUNTER — Ambulatory Visit: Payer: Medicare Other

## 2018-03-19 ENCOUNTER — Ambulatory Visit (INDEPENDENT_AMBULATORY_CARE_PROVIDER_SITE_OTHER): Payer: Medicare Other | Admitting: General Practice

## 2018-03-19 DIAGNOSIS — Z7901 Long term (current) use of anticoagulants: Secondary | ICD-10-CM | POA: Diagnosis not present

## 2018-03-19 LAB — POCT INR: INR: 1.8 — AB (ref 2.0–3.0)

## 2018-03-19 NOTE — Progress Notes (Signed)
I have reviewed and agree with note, evaluation, plan.   Sholanda Croson, MD  

## 2018-03-19 NOTE — Patient Instructions (Addendum)
Pre visit review using our clinic review tool, if applicable. No additional management support is needed unless otherwise documented below in the visit note.  Take 1 tablet today(12/4) and and then change dosage and take 1 tablet daily except 1/2 tablet on Mondays.  Re-check in 4 weeks.

## 2018-03-24 ENCOUNTER — Ambulatory Visit: Payer: Self-pay | Admitting: Sports Medicine

## 2018-03-25 ENCOUNTER — Telehealth: Payer: Self-pay | Admitting: Family Medicine

## 2018-03-25 NOTE — Telephone Encounter (Signed)
MEDICATION: gabapentin (NEURONTIN) 300 MG capsule  PHARMACY:  [facility], [address]  IS THIS A 90 DAY SUPPLY : Yes  IS PATIENT OUT OF MEDICATION: yes  IF NOT; HOW MUCH IS LEFT:   LAST APPOINTMENT DATE: @10 /18/2019  NEXT APPOINTMENT DATE:@1 /23/2020  OTHER COMMENTS:    **Let patient know to contact pharmacy at the end of the day to make sure medication is ready. **  ** Please notify patient to allow 48-72 hours to process**  **Encourage patient to contact the pharmacy for refills or they can request refills through [facility]**

## 2018-03-25 NOTE — Telephone Encounter (Signed)
hasnt been seen in 9 months- lets get him in for a visit

## 2018-03-27 ENCOUNTER — Ambulatory Visit: Payer: Medicare Other | Admitting: Sports Medicine

## 2018-03-27 ENCOUNTER — Other Ambulatory Visit: Payer: Self-pay

## 2018-03-27 MED ORDER — GABAPENTIN 300 MG PO CAPS
900.0000 mg | ORAL_CAPSULE | Freq: Every day | ORAL | 0 refills | Status: DC
Start: 2018-03-27 — End: 2018-04-16

## 2018-03-27 NOTE — Addendum Note (Signed)
Addended by: Jasper Loser on: 03/27/2018 01:31 PM   Modules accepted: Orders

## 2018-03-27 NOTE — Telephone Encounter (Signed)
Called and spoke to patient who stated he has an appointment today with Dr. Paulla Fore. He stated he is requesting Gabapentin to help him sleep because he is in pain. I asked him if the pain he is experiencing is the pain that he is seeing Dr. Paulla Fore for today and he stated yes. I encouraged him to discuss his treatment options with Dr. Paulla Fore. He asked to speak to Dr. Nicolasa Ducking nurse. He wanted to move his appointment time. I spoke with Wendy Poet who stated if there was an opening that patient could be moved. When I got back to the phone, there was no connection.

## 2018-03-27 NOTE — Telephone Encounter (Signed)
Per Dr. Paulla Fore, Linda to refill.

## 2018-03-27 NOTE — Telephone Encounter (Signed)
Rx previously prescribed by a provider with [facility]. Looks like last refill date was 11/13/17 #180/1.

## 2018-03-27 NOTE — Telephone Encounter (Signed)
Called pt and confirmed dose, sig, and pharmacy. Rx refilled.

## 2018-03-28 ENCOUNTER — Ambulatory Visit: Payer: Medicare Other | Admitting: Sports Medicine

## 2018-03-28 ENCOUNTER — Telehealth: Payer: Self-pay | Admitting: Sports Medicine

## 2018-03-28 DIAGNOSIS — Z0289 Encounter for other administrative examinations: Secondary | ICD-10-CM

## 2018-03-28 NOTE — Telephone Encounter (Signed)
Patient arrived late for his 2:40 appointment on 03/28/18.  Patient came in yesterday to cancel his appointment for 03/27/18 and we rescheduled the apt for today at 2:40pm. Patient came in around 3pm and I informed him that he missed his 2:40 appointment and asked him how he would like me to move forward with another appointment. Patient then turned around to leave, I apologized and he responded "Yes you are." Patient then went to my coworker to figure out potential charges due for missing appointment and then left.

## 2018-03-28 NOTE — Telephone Encounter (Signed)
Pt has appt scheduled for 03/31/18 at 10 am

## 2018-03-28 NOTE — Telephone Encounter (Signed)
Hello,  The Patient was a no show on 03/28/18  Please determine one of the following actions:   A. No follow-up necessary.  B. Follow-up urgent. Locate patient immediately.  C. Follow-up necessary. Contact patient and schedule visit in _______ days.  D. Follow-up advised. Contact patient and schedule visit in _______ weeks.   Date_______   Time________  Clinical Assistant Name__________  Record Details of communication with the patient:

## 2018-03-29 NOTE — Telephone Encounter (Signed)
I would like to discuss this further with Dr. Yong Channel.  This is unacceptable behavior.  He does have an appointment with me on Monday and this will be addressed as well as the need for a follow-up pelvic MRI from the abnormal findings he previously had.  Given his ongoing medical care I do not think dismissing him from the practice at this time would be ideal however if ongoing inappropriate behavior including showing up significantly late for his appointment continues this will need to be considered.

## 2018-03-31 ENCOUNTER — Encounter: Payer: Self-pay | Admitting: Sports Medicine

## 2018-03-31 ENCOUNTER — Ambulatory Visit (INDEPENDENT_AMBULATORY_CARE_PROVIDER_SITE_OTHER): Payer: Medicare Other | Admitting: Sports Medicine

## 2018-03-31 VITALS — BP 120/74 | HR 51 | Ht 68.5 in | Wt 199.0 lb

## 2018-03-31 DIAGNOSIS — C9001 Multiple myeloma in remission: Secondary | ICD-10-CM | POA: Diagnosis not present

## 2018-03-31 DIAGNOSIS — Z7901 Long term (current) use of anticoagulants: Secondary | ICD-10-CM | POA: Diagnosis not present

## 2018-03-31 DIAGNOSIS — M160 Bilateral primary osteoarthritis of hip: Secondary | ICD-10-CM | POA: Diagnosis not present

## 2018-03-31 DIAGNOSIS — M545 Low back pain, unspecified: Secondary | ICD-10-CM

## 2018-03-31 DIAGNOSIS — M25552 Pain in left hip: Secondary | ICD-10-CM

## 2018-03-31 DIAGNOSIS — M549 Dorsalgia, unspecified: Secondary | ICD-10-CM | POA: Diagnosis not present

## 2018-03-31 DIAGNOSIS — M25551 Pain in right hip: Secondary | ICD-10-CM

## 2018-03-31 DIAGNOSIS — G8929 Other chronic pain: Secondary | ICD-10-CM

## 2018-03-31 MED ORDER — TIZANIDINE HCL 4 MG PO TABS: 4.0000 mg | ORAL_TABLET | Freq: Three times a day (TID) | ORAL | 1 refills | Status: AC

## 2018-03-31 NOTE — Progress Notes (Signed)
Rodney D. Rigby, Rodney [name]  [facility] [phone]  Rodney [name] - 77 y.o. male MRN [id number]  Date of birth: 12/21/1940  Visit Date: April 06, 2018  PCP: Hunter, Stephen O, MD   Referred by: Hunter, Stephen O, MD  SUBJECTIVE:  Chief Complaint  Patient presents with  . Lower Back - Follow-up  . Right Hip - Follow-up  . Left Hip - Follow-up    HPI: Patient presents for the above issues.  Overall he is continuing to get fairly significant symptoms with forward bending especially if having to lean over to pick something up off the floor.  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  He occasionally continues to get stiffness and cramping in his left lower extremity but his left hip does seem to be improving. His right hip continues to be worse with squatting and radiates into the glutes groin and leg.  His right leg does cause some swelling all the way down into the right foot.  It seems like something is in the bottom of his foot.  He has been using a half a patch nitroglycerin protocol intermittently.  His low back pain is essentially unchanged.  REVIEW OF SYSTEMS: He continues to have nighttime disturbances due to this discomfort.  He does have a personal history of multiple myeloma and prostate cancer is due for a repeat MRI of the pelvis based on abnormal findings from prior. He denies any changes in bowel or bladder.  No significant fevers chills or night sweats. He does continue to have right lower extremity edema.  HISTORY:  Prior history reviewed and updated per electronic medical record.  Social History   Occupational History  . Occupation: retired  Tobacco Use  . Smoking status: Former Smoker    Last attempt to quit: 02/15/1969    Years since quitting: 49.1  . Smokeless tobacco: Never Used   . Tobacco comment: quit 50 yrs  Substance and Sexual Activity  . Alcohol use: No    Alcohol/week: 0.0 standard drinks  . Drug use: No  . Sexual activity: Yes   Social History   Social History Narrative   Married   Regular exercise-yes    OBJECTIVE:  VS:  HT:5' 8.5" (174 cm)   WT:199 lb (90.3 kg)  BMI:29.81    BP:120/74  HR:(!) 51bpm  TEMP: ( )  RESP:98 %   PHYSICAL EXAM: Adult male.  No acute distress.  Alert and appropriate. Negative straight leg raise from a radicular component but he is markedly tight bilaterally.  He has marked anterior chain dominance bilaterally. Good internal and external rotation of bilateral hips.  He does have pain with direct palpation over the right gluteal musculature. He walks with a anterior carriage and slightly antalgic gait.    ASSESSMENT   1. Chronic bilateral back pain, unspecified back location   2. Long term (current) use of anticoagulants   3. Multiple myeloma in remission (HCC)   4. Primary osteoarthritis of both hips   5. Pain of both hip joints   6. Back pain, lumbosacral      PROCEDURES:  None  PLAN:  Pertinent additional documentation may be included in corresponding procedure notes, imaging studies, problem based documentation and patient instructions.  Discussion today with the patient regarding office no-show policy and   his discussions with the staff.  He does report that if he receives a bill for late cancellations that he will likely no longer be coming to our practice and I did share that this is unfortunate but this is our clinical policy and that it is in place for a reason.  This has not been an isolated incident and I suspect there may be continued discontent  He does however need a follow-up MRI of his pelvis and this has been ordered today.  Additionally given the ongoing muscle spasms and pain especially at nighttime I would like to transition him over to Zanaflex and prescription for that was provided  today.  Continue previously prescribed home exercise program.   Referral to physical therapy was also placed today see will benefit from continued core strengthening and may benefit from dry needling  Discussed the underlying features of tight hip flexors leading to crouched, fetal like position that results in spinal column compression.  Including lumbar hyperflexion with hypermobility, thoracic flexion with restrictive rotation and cervical lordosis reversal.   Activity modifications and the importance of avoiding exacerbating activities (limiting pain to no more than a 4 / 10 during or following activity) recommended and discussed.  Discussed red flag symptoms that warrant earlier emergent evaluation and patient voices understanding.  >50% of this 25 minutes minute visit spent in direct patient counseling and/or coordination of care. Discussion was focused on education regarding the in discussing the pathoetiology and anticipated clinical course of the above condition.   Meds ordered this encounter  Medications  . tiZANidine (ZANAFLEX) 4 MG tablet    Sig: Take 1 tablet (4 mg total) by mouth 3 (three) times daily.    Dispense:  90 tablet    Refill:  1    Imaging Orders     MR PELVIS WO CONTRAST  Referral Orders     Ambulatory referral to Physical Therapy  Return for for MRI review.          Gerda Diss, [facility] Physician

## 2018-03-31 NOTE — Telephone Encounter (Signed)
I agree- unacceptable to be rude to our team.   Marcene Brawn- there was another incident sometime in the last year I believe- can you review chart to see.   Can you inform patient of our expectations of behavior to staff and remind him of 20 minute policy. Please give him an arrival time for next visit 20 minutes before next visit with Dr. Paulla Fore

## 2018-04-01 ENCOUNTER — Other Ambulatory Visit: Payer: Self-pay | Admitting: Family Medicine

## 2018-04-01 NOTE — Telephone Encounter (Signed)
I will follow up with Dr. Paulla Fore. He stated in his earlier message that he was going to address this issue with patient at his visit on Monday.

## 2018-04-01 NOTE — Telephone Encounter (Signed)
There was an incident on 10/25/17 and it is documented in the chart. Lea, can you please assist further due me being involved in the 10/25/17 occurance and the ongoing of events recently. Thanks

## 2018-04-07 NOTE — Telephone Encounter (Signed)
Please see office note from my last visit.  This was briefly addressed with him.

## 2018-04-08 ENCOUNTER — Encounter: Payer: Self-pay | Admitting: Physical Therapy

## 2018-04-11 ENCOUNTER — Other Ambulatory Visit: Payer: Self-pay | Admitting: Sports Medicine

## 2018-04-14 ENCOUNTER — Ambulatory Visit (INDEPENDENT_AMBULATORY_CARE_PROVIDER_SITE_OTHER): Payer: Medicare Other | Admitting: Physical Therapy

## 2018-04-14 ENCOUNTER — Encounter: Payer: Self-pay | Admitting: Physical Therapy

## 2018-04-14 DIAGNOSIS — M25551 Pain in right hip: Secondary | ICD-10-CM | POA: Diagnosis not present

## 2018-04-14 DIAGNOSIS — M545 Low back pain: Secondary | ICD-10-CM | POA: Diagnosis not present

## 2018-04-14 DIAGNOSIS — M25552 Pain in left hip: Secondary | ICD-10-CM | POA: Diagnosis not present

## 2018-04-14 DIAGNOSIS — G8929 Other chronic pain: Secondary | ICD-10-CM | POA: Diagnosis not present

## 2018-04-14 NOTE — Therapy (Signed)
[facility] [address] [place], [place], [address] Phone: [phone]   Fax:  [phone]  Physical Therapy Evaluation  Patient Details  Name: Rodney [name] MRN: [id number] Date of Birth: [birth year] Referring Provider (PT): Teresa Coombs   Encounter Date: 04/14/2018  PT End of Session - 04/14/18 1346    Visit Number  1    Number of Visits  1    Date for PT Re-Evaluation  04/14/18    Authorization Type  [employer] medicare    PT Start Time  1305    PT Stop Time  1340    PT Time Calculation (min)  35 min    Activity Tolerance  Patient tolerated treatment well    Behavior During Therapy  [facility] for tasks assessed/performed       Past Medical History:  Diagnosis Date  . Anemia 02-16-11   01-05-11- post surgery-Transfusions x 4 units  . Arthritis   . Bone pain 02/18/2013  . Clotting disorder ([place])   . Complication of anesthesia 02-16-11   Pt. speaks of awakening during the surgery from back  surgery  . Degenerative disc disease 02-16-11   01-05-11 L3 fusion done  . Esophageal reflux 05/31/2008   Qualifier: Diagnosis of  By: Arnoldo Morale MD, Balinda Quails   . Hernia 02-16-11   left inguinal hernia at present  . Hypothyroidism 02/01/2014  . Multiple myeloma 02-16-11   suspected tumor  left sacral  . Pancreatitis   . Personal history of traumatic fracture 07/10/2012  . Prostate cancer ([place])   . Pulmonary embolism ([place])   . Second degree atrioventricular block 11/26/2012  . Shortness of breath 02-16-11   hx. Pulmonary emboli x2 (9 yrs/ 3'12 -last)    Past Surgical History:  Procedure Laterality Date  . APPENDECTOMY    . CHOLECYSTECTOMY  04/06/2010   laparoscopic-inflammation with stones  . INGUINAL HERNIA REPAIR  02/20/2011   Procedure: HERNIA REPAIR INGUINAL ADULT;  Surgeon: Earnstine Regal, MD;  Location: [facility];  Service: General;  Laterality: Left;  Repair Left Inguinal Hernia with Mesh  . LUMBAR DISC SURGERY  02-16-11    01-05-11 Lumbar  surgery L3(complicated by loss of blood volume)/ 01-09-11 then Lumbar fusion done with retained  hardware   . PROSTATE SURGERY Bilateral 2011  . TONSILLECTOMY AND ADENOIDECTOMY      There were no vitals filed for this visit.   Subjective Assessment - 04/14/18 1308    Subjective  Pt states increased pain in central lumbar region and both hips R>L. Pain in L hip is somewhat improved.  Pt works ful time at [employer], standing , has significant pain when working. Pt states much pain and difficulty when trying to sleep, only sleeping 3-4 hours /night.      Patient Stated Goals  Decreased pain     Currently in Pain?  Yes    Pain Score  7     Pain Orientation  Left;Right    Pain Descriptors / Indicators  Aching    Pain Type  Chronic pain    Pain Onset  More than a month ago    Pain Frequency  Intermittent    Aggravating Factors   bending,  Laying in bed too long /sleeping.     Multiple Pain Sites  Yes    Pain Score  8    Pain Location  Hip    Pain Orientation  Right    Pain Type  Chronic pain    Pain  Onset  More than a month ago    Pain Frequency  Intermittent    Aggravating Factors   Sitting, pressure on glute. , Bending    Effect of Pain on Daily Activities  Pain travels down R lateral thigh.         [facility] PT Assessment - 04/14/18 0001      Assessment   Medical Diagnosis  Back Pain, Bil hip pain R.>L.     Referring Provider (PT)  Teresa Coombs    Prior Therapy  No      Balance Screen   Has the patient fallen in the past 6 months  Yes    How many times?  1    Has the patient had a decrease in activity level because of a fear of falling?   No    Is the patient reluctant to leave their home because of a fear of falling?   No      Prior Function   Level of Independence  Independent      Cognition   Overall Cognitive Status  Within Functional Limits for tasks assessed      ROM / Strength   AROM / PROM / Strength  AROM;Strength      AROM   Overall AROM Comments  Lumbar:  flexion: significant limitation, pain in glute;  Ext: significant limitation;  SB: significant limtiation R>L.;   Hip: moderate limitations for rotation, Ext : mod limitation;       Strength   Overall Strength Comments  Hips: 4/5;        Special Tests   Other special tests  Neg SLR                 Objective measurements completed on examination: See above findings.              PT Education - 04/14/18 1315    Education Details  PT expectations, attending 2x/wk.     Person(s) Educated  Patient    Methods  Explanation    Comprehension  Verbalized understanding                  Plan - 04/14/18 1348    Clinical Impression Statement  Pt presents with primary complaint of increased pain in back and bil hips R>L. He has significant ROM limtiations for spine, and mild limitations for hips. Pt with pain with most movements today. Full evaluation, special tests, palpation, HEP,not finished, due to pt stating he did not want to continue. We were discussing his dislike for most of his past and current doctors, and he stated how he didnt want to come here because "we charge at this clinic" Pt believes he can be seen elsewhere for PT with no charge to him.  He was given option to discontinue treatment today, if he is unwilling to return here for future sessions, and pt agreed that he would like to leave.I stated that we are here to help him, and that I was sorry he did not want help.  He became mildly agitated, stating that he "knows people" and he can tell that I do not like him. He said this likley because i asked him at least 2 times to please use his own hand and point to his own body when he was asked where he had pain. Pt at least 2 times, touched my back and my wrist (instead of his own) when asked to describe where he had pain.  I walked him  out to the waiting room, and pt states that he will do therapy elsewhere. Referring MD notified that pt will not be doing therapy  at this clinic. Pt with multiple deficits that would likely benefit from Physical Therapy.      Clinical Presentation  Evolving    Clinical Decision Making  Moderate    Rehab Potential  Good    PT Treatment/Interventions  ADLs/Self Care Home Management;Cryotherapy;Functional mobility training;Stair training;Gait training;Moist Heat;Traction;Therapeutic activities;Therapeutic exercise;Balance training;Neuromuscular re-education;Patient/family education;Passive range of motion;Manual techniques    Consulted and Agree with Plan of Care  Patient       Patient will benefit from skilled therapeutic intervention in order to improve the following deficits and impairments:  Abnormal gait, Decreased range of motion, Difficulty walking, Increased muscle spasms, Pain, Decreased balance, Hypomobility, Impaired flexibility, Improper body mechanics, Decreased mobility, Decreased strength, Decreased activity tolerance  Visit Diagnosis: Chronic bilateral low back pain without sciatica  Pain in right hip  Pain in left hip     Problem List Patient Active Problem List   Diagnosis Date Noted  . Tear of left gluteus medius tendon 11/08/2017  . Primary osteoarthritis of both hips 11/08/2017  . Vitamin D deficiency 04/09/2017  . Overactive bladder 04/09/2017  . History of skin cancer 04/09/2017  . Back pain, thoracic 12/19/2016  . Long term (current) use of anticoagulants 12/14/2016  . Recurrent major depression in full remission ([place]) 10/02/2016  . Lump of skin 04/23/2016  . Bilateral hip pain 04/06/2016  . Carpal tunnel syndrome on both sides 01/24/2016  . Right foot pain 10/05/2015  . Tendonitis of ankle, left 10/03/2015  . Hypothyroidism 02/01/2014  . Allergic rhinitis 02/01/2014  . Bone pain 02/18/2013  . Multiple myeloma in remission ([place]) 07/10/2012  . Back pain, lumbosacral 02/11/2012  . Esophageal reflux 05/31/2008  . History of duodenal ulcer 01/05/2008  . PROSTATE CANCER, HX OF  01/05/2008  . Pulmonary embolism ([place]) 01/05/2008   Lyndee Hensen, PT, DPT 2:11 PM  04/14/18   [facility] [place], [place], [address] Phone: [phone]   Fax:  [phone]  Name: Rodney [name] MRN: [id number] Date of Birth: [birth year]

## 2018-04-14 NOTE — Telephone Encounter (Signed)
Last OV 03/31/18 Last refill 11/08/17 #30/1 Last OV not scheduled

## 2018-04-15 ENCOUNTER — Other Ambulatory Visit: Payer: Self-pay | Admitting: Sports Medicine

## 2018-04-15 ENCOUNTER — Ambulatory Visit
Admission: RE | Admit: 2018-04-15 | Discharge: 2018-04-15 | Disposition: A | Discharge status: Home / Self Care | Payer: Medicare Other | Source: Ambulatory Visit | Attending: Sports Medicine | Admitting: Sports Medicine

## 2018-04-15 DIAGNOSIS — S34131A Complete lesion of sacral spinal cord, initial encounter: Secondary | ICD-10-CM | POA: Diagnosis not present

## 2018-04-15 DIAGNOSIS — Z7901 Long term (current) use of anticoagulants: Secondary | ICD-10-CM

## 2018-04-15 DIAGNOSIS — M25551 Pain in right hip: Secondary | ICD-10-CM

## 2018-04-15 DIAGNOSIS — M545 Low back pain, unspecified: Secondary | ICD-10-CM

## 2018-04-15 DIAGNOSIS — M160 Bilateral primary osteoarthritis of hip: Secondary | ICD-10-CM

## 2018-04-15 DIAGNOSIS — G8929 Other chronic pain: Secondary | ICD-10-CM

## 2018-04-15 DIAGNOSIS — M25552 Pain in left hip: Secondary | ICD-10-CM

## 2018-04-15 DIAGNOSIS — C9001 Multiple myeloma in remission: Secondary | ICD-10-CM

## 2018-04-15 DIAGNOSIS — M549 Dorsalgia, unspecified: Secondary | ICD-10-CM

## 2018-04-16 ENCOUNTER — Telehealth: Payer: Self-pay

## 2018-04-16 ENCOUNTER — Ambulatory Visit: Payer: Medicare Other

## 2018-04-16 NOTE — Telephone Encounter (Signed)
Copied from [place] [id number]. Topic: General - Other >> Apr 16, 2018  4:29 PM Yvette Rack wrote: Reason for CRM: Pt requests to speak with Dr. Paulla Fore regarding MRI. Cb# [phone]

## 2018-04-16 NOTE — Telephone Encounter (Signed)
Forwarding to Dr. Rigby.  

## 2018-04-21 ENCOUNTER — Ambulatory Visit (INDEPENDENT_AMBULATORY_CARE_PROVIDER_SITE_OTHER): Payer: Medicare Other | Admitting: Sports Medicine

## 2018-04-21 ENCOUNTER — Encounter: Payer: Self-pay | Admitting: Sports Medicine

## 2018-04-21 VITALS — BP 110/64 | HR 62 | Temp 98.8°F | Ht 68.5 in | Wt 196.8 lb

## 2018-04-21 DIAGNOSIS — M6258 Muscle wasting and atrophy, not elsewhere classified, other site: Secondary | ICD-10-CM

## 2018-04-21 DIAGNOSIS — M545 Low back pain, unspecified: Secondary | ICD-10-CM

## 2018-04-21 DIAGNOSIS — M549 Dorsalgia, unspecified: Secondary | ICD-10-CM

## 2018-04-21 DIAGNOSIS — G8929 Other chronic pain: Secondary | ICD-10-CM

## 2018-04-21 DIAGNOSIS — M25552 Pain in left hip: Secondary | ICD-10-CM

## 2018-04-21 DIAGNOSIS — M25551 Pain in right hip: Secondary | ICD-10-CM

## 2018-04-21 DIAGNOSIS — C9001 Multiple myeloma in remission: Secondary | ICD-10-CM

## 2018-04-21 NOTE — Patient Instructions (Addendum)

## 2018-04-21 NOTE — Telephone Encounter (Signed)
Results were reviewed in clinic with the patient today.

## 2018-04-21 NOTE — Progress Notes (Signed)
Rodney [name]. Rodney [name], Rodney [name]  Rodney [name] - 78 y.o. male MRN [id number]  Date of birth: 1941/01/13  Visit Date: 04/21/2018  PCP: Marin Olp, MD   Referred by: Marin Olp, MD  SUBJECTIVE:   Chief Complaint  Patient presents with  . Lower Back - Follow-up    Prescribed Tizanidine, follows Nitro Protocol intermittently. Referred to PT. Worse w/ forwarding bending, prolonged working, squatting. Radiates to glutes, groin, leg.   . Right Hip - Follow-up    Occasional swelling in R foot.   . Left Hip - Follow-up  . MRI review    MRI Pelvis 04/15/2018    HPI: Patient is here for follow-up of the above.  His MRI results were reassuring these were reviewed with him in person today.  Overread of the MRI does show that he has significant atrophy within the gluteal muscles on the right is not present on the left.  No appreciable tear is seen.  He does report continued to have moderate amount of pain especially on the right side to the point that he is unable to lay on it.  It does occasionally awaken him.   REVIEW OF SYSTEMS: Per HPI  HISTORY:  Prior history reviewed and updated per electronic medical record.  Patient Active Problem List   Diagnosis Date Noted  . Tear of left gluteus medius tendon 11/08/2017  . Primary osteoarthritis of both hips 11/08/2017    Has tried Tramadol, Voltaren gel, Diazepam, Nitro Protocol, Baclofen.   04/15/2018 MRI pelvis IMPRESSION: 1. The fatty and cystic lesion in the left sacrum is stable from previous MRI of 6 months ago. Based on older comparison studies, this is favored to reflect a treated multiple myeloma lesion. 2. No new lesions or pathologic fracture. 3. Mild trochanteric bursitis bilaterally.  MRI pelvis 10/07/2017 IMPRESSION: 1. Moderate left greater trochanteric bursitis. 2. Tear of the left gluteus minimus tendon. 3. Large fat attenuating  intramedullary bone lesion in the left side of the measuring approximately 5.5 x 3.9 x 4.1 cm with cystic areas within the bone lesion with the overall appearance most consistent with treated malignancy. 4. 8 mm T2 hyperintense and T1 hypointense bone lesion in the proximal left femoral diaphysis likely reflecting treated bone lesion. Recommend correlation with laboratory values and follow-up MRI in 3-6 months. 5. Lumbar spine spondylosis.  MRI L-spine 10/07/2017 IMPRESSION: 1. L3 corpectomy and L2-L4 posterior instrumented fusion. No apparent hardware related complication. 2. Left hemipelvis lytic lesion with interval near complete fatty and small cyst replacement. No new lesion identified. 3. Progression of lumbar spondylosis at the L4-5 and L5-S1 levels. 4. Moderate bilateral L4-5 and L5-S1 canal stenosis. Moderate bilateral L4-5 and mild bilateral L5-S1 foraminal stenosis.  XR B hips 07/01/2017 IMPRESSION: Mild bilateral hip joint degenerative changes.   . Vitamin D deficiency 04/09/2017    On 4000 units a day   . Overactive bladder 04/09/2017    myrbetriq '25mg'$  daily. Urology rx   . History of skin cancer 04/09/2017    Looking for dermatologist- had been seen near [place]   . Back pain, thoracic 12/19/2016  . Long term (current) use of anticoagulants 12/14/2016  . Recurrent major depression in full remission ([place]) 10/02/2016    citalopram '10mg'$    . Lump of skin 04/23/2016    Per Dr. Alvy Bimler- likely lipoma- was referred to Dr. Harlow Asa- patient states he didn't see him.    . Bilateral  hip pain 04/06/2016  . Carpal tunnel syndrome on both sides 01/24/2016    Deals with pain and numbness and tingling.  Median nerve is enlarged left greater than right consistent with moderate CTS on the left and mild to moderate on the right.  He does have some CMC arthritis as well especially in the left hand greater than right.   . Right foot pain 10/05/2015    Has seen Dr. Barbaraann Barthel before. Has  seen podiatry as well in past. Has interfered with him turning foot and has caused falls   . Tendonitis of ankle, left 10/03/2015  . Hypothyroidism 02/01/2014    Levothyroxine 100 mcg   . Allergic rhinitis 02/01/2014    Drainage and cough when at work- works at [employer]   . Bone pain 02/18/2013  . Multiple myeloma in remission ([place]) 07/10/2012    Follows in [place]- stem cell transplant. Revlimid. 21 days on, 7 days off.    . Back pain, lumbosacral 02/11/2012    Surgery on low back in past. 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  Discussed relafen not ideal with warfarin as increases bleeding risk.    Joint pain worst in low back, right hip. Not the best success with PT.   MRI of the pelvis obtained on 10/08/2017 recommending follow-up MRI in 3 to 6 months.   . Esophageal reflux 05/31/2008    Consider daily. Omeprazole '20mg'$  BID. Also tums at times. History duodenal ulcer    . History of duodenal ulcer 01/05/2008    Qualifier: Diagnosis of  By: Arnoldo Morale MD, [place], HX OF 01/05/2008    Prostatectomy around 2010. No longer sees urology. PSA has been 0 - most recent 09/24/16 Lab Results  Component Value Date   PSA 0.00 Repeated and verified X2. (L) 09/24/2016   PSA 0.00 Repeated and verified X2. (L) 02/17/2016   PSA <0.01 05/18/2013       . Pulmonary embolism ([place]) 01/05/2008    Coumadin long term. Has had 2 PEs so now on long term One was after prostatectomy, one was after back surgery    Social History   Occupational History  . Occupation: retired  Tobacco Use  . Smoking status: Former Smoker    Last attempt to quit: 02/15/1969    Years since quitting: 49.2  . Smokeless tobacco: Never Used  . Tobacco comment: quit 50 yrs  Substance and Sexual Activity  . Alcohol use: No    Alcohol/week: 0.0 standard drinks  . Drug use: No  . Sexual activity: Yes   Social History   Social  History Narrative   Married   Regular exercise-yes     OBJECTIVE:  VS:  HT:5' 8.5" (174 cm)   WT:196 lb 12.8 oz (89.3 kg)  BMI:29.48    BP:110/64  HR:62bpm  TEMP:98.8 F (37.1 C)(Oral)  RESP:96 %   PHYSICAL EXAM: Adult male. No acute distress.  Alert and appropriate. Bilateral lower extremities are overall well aligned.  He walks with a slight antalgic bent forward gait.  He has pain over the right greater trochanter.  His hip abduction strength is diminished.  He has good internal and external rotation of bilateral hips.  Negative straight leg raise bilaterally.  MRI reviewed in detail with over read as above.   ASSESSMENT:   1. Chronic bilateral low back pain without sciatica   2. Pain in right hip  3. Atrophy of gluteus maximus muscle   4. Chronic bilateral back pain, unspecified back location   5. Multiple myeloma in remission (HCC)   6. Pain of both hip joints   7. Greater trochanteric pain syndrome of right lower extremity     PROCEDURES:  PROCEDURE NOTE: [place] Guided: Injection: Right greater trochanteric DESCRIPTION OF PROCEDURE: The patient's clinical condition is marked by substantial pain and/or significant functional disability. Other conservative therapy has not provided relief, is contraindicated, or not appropriate. There is a reasonable likelihood that injection will significantly improve the patient's pain and/or functional impairment. After discussing the risks, benefits and expected outcomes of the injection and all questions were reviewed and answered, the patient wished to undergo the above named procedure. Verbal consent was obtained. The skin was then prepped in sterile fashion and the target structure was injected as below: Single injection performed as below: PREP: Alcohol and Ethel Chloride APPROACH: direct, single injection, 25g 1.5 in. INJECTATE: 3 cc 0.5% Marcaine and 1 cc '40mg'$ /mL DepoMedrol DRESSING: Band-Aid Post procedural instructions  including recommending icing and warning signs for infection were reviewed.  This procedure was well tolerated and there were no complications.      PLAN:  Pertinent additional documentation may be included in corresponding procedure notes, imaging studies, problem based documentation and patient instructions.  No problem-specific Assessment & Plan notes found for this encounter.  Greater trochanteric injection performed today.  He had moderate improvement with this.  We did have discussion that unfortunately he did not form a therapeutic relationship during his physical therapy evaluation and he will be pursuing physical therapy at an outside location.  He is having persistent ongoing pain but this does not appear to be related to the underlying bony abnormalities previously seen on the MRI.  He should respond well to the greater trochanteric injection as well as continue nitroglycerin protocol and therapeutic exercises.  He was able to appreciate the difference from side to side between his right and left gluteal muscles that are markedly different on the MRI.  Continue previously prescribed home exercise program.   Continue working with physical therapy  TENDINOPATHY - Discussed that the anticipated amount of time for healing is 12- 24 weeks for Tendinopathic changes. Emphasized the importance of improving blood flow as well as eccentric loading of the tendon.  Activity modifications and the importance of avoiding exacerbating activities (limiting pain to no more than a 4 / 10 during or following activity) recommended and discussed.  Discussed red flag symptoms that warrant earlier emergent evaluation and patient voices understanding.   Return in about 6 weeks (around 06/02/2018).          Gerda Diss, [facility] Physician

## 2018-04-23 ENCOUNTER — Ambulatory Visit: Payer: Medicare Other

## 2018-04-30 ENCOUNTER — Ambulatory Visit (INDEPENDENT_AMBULATORY_CARE_PROVIDER_SITE_OTHER): Payer: Medicare Other | Admitting: General Practice

## 2018-04-30 DIAGNOSIS — Z7901 Long term (current) use of anticoagulants: Secondary | ICD-10-CM

## 2018-04-30 LAB — POCT INR: INR: 2.5 (ref 2.0–3.0)

## 2018-04-30 NOTE — Patient Instructions (Addendum)
Pre visit review using our clinic review tool, if applicable. No additional management support is needed unless otherwise documented below in the visit note. ° °Continue to take 1 tablet daily except 1/2 tablet on Mondays.  Re-check in 4 weeks. ° °

## 2018-04-30 NOTE — Progress Notes (Signed)
I have reviewed and agree with this plan  

## 2018-05-05 ENCOUNTER — Other Ambulatory Visit: Payer: Self-pay | Admitting: Physical Therapy

## 2018-05-05 DIAGNOSIS — M545 Low back pain, unspecified: Secondary | ICD-10-CM

## 2018-05-05 DIAGNOSIS — G8929 Other chronic pain: Secondary | ICD-10-CM

## 2018-05-07 ENCOUNTER — Encounter: Payer: Self-pay | Admitting: Physical Therapy

## 2018-05-07 ENCOUNTER — Ambulatory Visit: Payer: Medicare Other | Attending: Sports Medicine | Admitting: Physical Therapy

## 2018-05-07 DIAGNOSIS — M545 Low back pain, unspecified: Secondary | ICD-10-CM

## 2018-05-07 DIAGNOSIS — M6281 Muscle weakness (generalized): Secondary | ICD-10-CM | POA: Diagnosis not present

## 2018-05-07 DIAGNOSIS — G8929 Other chronic pain: Secondary | ICD-10-CM | POA: Diagnosis not present

## 2018-05-07 DIAGNOSIS — M25551 Pain in right hip: Secondary | ICD-10-CM

## 2018-05-07 NOTE — Patient Instructions (Addendum)
     [facility] Welcome to [facility]! Here you will find all the information you will need regarding your pool therapy. If you have further questions at any time, please call our office at [phone]. After completing your initial evaluation in the [facility], you may be eligible to complete a portion of your therapy in the pool. A typical week of therapy will consist of 1-2 typical physical therapy visits at our [facility] location and an additional session of therapy in the pool located at the [facility] at [address], , [place] [address].  Aquatic therapy will be offered on Friday afternoons. Each session will last approximately 30 minutes. All scheduling and payments for aquatic therapy sessions, including cancelations, will be done through our [facility] location.  To be eligible for aquatic therapy, these criteria must be met: . You must be able to independently change in the locker room and get to the pool deck. A caregiver can come with you to help if needed. There are bleachers for a caregiver to sit on next to the pool. . No one with an open wound is permitted in the pool.  Handicap parking is available in the front and there is a drop off option for even closer accessibility. Please arrive 15 minutes prior to your appointment to prepare for your pool session. You must sign in at the front desk upon your arrival. Please be sure to attend to any toileting needs prior to entering the pool. Locker rooms for changing are located to the right of the check-in desk. There is direct access to the pool deck from the locker room. You can lock your belongings in a locker but must bring a lock. Your therapist will greet you on the pool deck. There may be other swimmers in the pool at the same time but your session is one-on-one with the therapist.      

## 2018-05-07 NOTE — Therapy (Signed)
[facility] [address] [place], [place], [address] Phone: [phone]   Fax:  [phone]  Physical Therapy Evaluation  Patient Details  Name: Rodney [name] MRN: [id number] Date of Birth: [birth year] Referring Provider (PT): Dr. Teresa Coombs   Encounter Date: 05/07/2018  PT End of Session - 05/07/18 1549    Visit Number  1    Date for PT Re-Evaluation  07/02/18    Authorization Type  [employer] tricare    PT Start Time  3329    PT Stop Time  1605    PT Time Calculation (min)  35 min    Activity Tolerance  Patient tolerated treatment well;Patient limited by pain    Behavior During Therapy  [facility] for tasks assessed/performed       Past Medical History:  Diagnosis Date  . Anemia 02-16-11   01-05-11- post surgery-Transfusions x 4 units  . Arthritis   . Bone pain 02/18/2013  . Clotting disorder ([place])   . Complication of anesthesia 02-16-11   Pt. speaks of awakening during the surgery from back  surgery  . Degenerative disc disease 02-16-11   01-05-11 L3 fusion done  . Esophageal reflux 05/31/2008   Qualifier: Diagnosis of  By: Arnoldo Morale MD, Balinda Quails   . Hernia 02-16-11   left inguinal hernia at present  . Hypothyroidism 02/01/2014  . Multiple myeloma 02-16-11   suspected tumor  left sacral  . Pancreatitis   . Personal history of traumatic fracture 07/10/2012  . Prostate cancer ([place])   . Pulmonary embolism ([place])   . Second degree atrioventricular block 11/26/2012  . Shortness of breath 02-16-11   hx. Pulmonary emboli x2 (9 yrs/ 3'12 -last)    Past Surgical History:  Procedure Laterality Date  . APPENDECTOMY    . CHOLECYSTECTOMY  04/06/2010   laparoscopic-inflammation with stones  . INGUINAL HERNIA REPAIR  02/20/2011   Procedure: HERNIA REPAIR INGUINAL ADULT;  Surgeon: Earnstine Regal, MD;  Location: [facility];  Service: General;  Laterality: Left;  Repair Left Inguinal Hernia with Mesh  . LUMBAR DISC SURGERY  02-16-11     01-05-11 Lumbar surgery L3(complicated by loss of blood volume)/ 01-09-11 then Lumbar fusion done with retained  hardware   . PROSTATE SURGERY Bilateral 2011  . TONSILLECTOMY AND ADENOIDECTOMY      There were no vitals filed for this visit.   Subjective Assessment - 05/07/18 1523    Subjective  Pt states increased pain in central lumbar region and both hips R>L. Pain in L hip is somewhat improved.  Pt works ful time at [employer], standing , has significant pain when working. Pt states much pain and difficulty when trying to sleep, only sleeping 3-4 hours /night.      Patient is accompained by:  Family member   wife   Diagnostic tests  MRI showed atrophy of right gluteal    Patient Stated Goals  Decreased pain     Currently in Pain?  Yes    Pain Score  7     Pain Location  Back    Pain Orientation  Left;Right    Pain Descriptors / Indicators  Aching    Pain Type  Chronic pain    Pain Onset  More than a month ago    Pain Frequency  Intermittent    Aggravating Factors   standing long period of time, driving a long period of time, when lay on back trouble getting straight and must go  slowly    Pain Relieving Factors  go sit in car with seat warmers    Multiple Pain Sites  Yes    Pain Score  8    Pain Location  Hip    Pain Orientation  Right    Pain Descriptors / Indicators  Aching;Dull    Pain Type  Chronic pain    Pain Onset  More than a month ago    Pain Frequency  Intermittent    Aggravating Factors   sitting, pressure on glute, bending,  working more than 6-7 hours    Pain Relieving Factors  use the patches but only helped the left hip         [facility] PT Assessment - 05/07/18 0001      Assessment   Medical Diagnosis  M54.5, G89.29 Chronic Bilateral low back pain withou sciatica    Referring Provider (PT)  Dr. 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[employer]      Cognition   Overall Cognitive Status  Within Functional Limits for tasks assessed      Observation/Other Assessments   Focus on Therapeutic Outcomes (FOTO)   Patient is limited by 50% due to pain, needing assistance to get out of bed      Posture/Postural Control   Posture/Postural Control  Postural limitations    Postural Limitations  Rounded Shoulders;Forward head;Decreased lumbar lordosis      ROM / Strength   AROM / PROM / Strength  AROM;PROM;Strength      AROM   Overall AROM Comments  Lumbar: flexion: significant limitation, pain in glute;  Ext: significant limitation;  SB: significant limtiation R>L.;   Hip: moderate limitations for rotation, Ext : mod limitation;       PROM   Right Hip External Rotation   30    Left Hip Flexion  100    Left Hip External Rotation   30    Left Hip ABduction  12      Strength   Overall Strength Comments  Hips strength 4/5 within available ROM        Right Hip   Right Hip Flexion  100    Right Hip ABduction  15      Special Tests    Special Tests  Hip Special Tests    Hip Special Tests   Saralyn Pilar (FABER) Test      Saralyn Pilar [facility]) Test   Findings  Positive    Side  Right;Left    Comments  pain      Transfers   Comments  needs assistance to get up and down from the mat                Objective measurements completed on examination: See above findings.              PT Education - 05/07/18 2233    Education Details  education on aquatic program and how he will benefit from it  Person(s) Educated  Patient    Methods  Explanation;Handout    Comprehension  Verbalized understanding       PT Short Term Goals - 05/07/18 2310      PT SHORT  TERM GOAL #1   Title  independent with initial HEP    Time  4    Period  Weeks    Status  New    Target Date  06/04/18        PT Long Term Goals - 05/07/18 1550      PT LONG TERM GOAL #1   Title  independent with HEP     Time  8    Period  Weeks    Status  New    Target Date  07/02/18      PT LONG TERM GOAL #2   Title  understand ways to manage back pain to continue working and daily activities    Time  8    Period  Weeks    Status  New    Target Date  07/02/18      PT LONG TERM GOAL #3   Title  educated on a aquatic program to strengthen his back and legs with protecting his spine    Time  8    Period  Weeks    Status  New    Target Date  07/02/18      PT LONG TERM GOAL #4   Title  understand correct body mechanics while at [employer] to reduce strain on his spine and hips    Time  8    Period  Weeks    Status  New    Target Date  07/02/18             Plan - 05/07/18 2234    Clinical Impression Statement  Patient is a 78 year old male with chronic back pain that goes into his right hip. Patient has significant ROM limitations of the spine and hips. Patients pain makes it difficulty for mobility, performing daily activities. Patient needed minimal assistance going from supine to sitting due to back pain. Patient takes several minutes to lay supine due due letting his spine straighten out. Patient works full time at [employer] and is on his feet increasing his hip and back pain. Patient will benefit from skilled therapy to improve mobility, understanding how to exercise in a safe manner and how to manage his pain.     Personal Factors and Comorbidities  Age;Comorbidity 3+    Comorbidities  multple myleloma 02/16/11; Mild bilateral trochanteric bursitis; Lumbar spondylosis L4-L5,L5-S1; Bilateral Canal Stenosis; Atrophy of right gluteals    Examination-Activity Limitations  Bathing;Bed Mobility;Bend    Stability/Clinical Decision Making  Evolving/Moderate  complexity    Clinical Decision Making  Moderate    Rehab Potential  Good    PT Frequency  2x / week    PT Duration  8 weeks    PT Treatment/Interventions  ADLs/Self Care Home Management;Cryotherapy;Functional mobility training;Stair training;Gait training;Moist Heat;Traction;Therapeutic activities;Therapeutic exercise;Balance training;Neuromuscular re-education;Patient/family education;Passive range of motion;Manual techniques    PT Next Visit Plan  work on trunk stabilizaton; bed mobility with correct breathing and core bracing; gentle stretches    Consulted and Agree with Plan of Care  Patient;Family member/caregiver    Family Member Consulted  wife       Patient will benefit from skilled therapeutic intervention in order to improve the following deficits and impairments:  Abnormal gait, Decreased range of motion, Difficulty walking,  Increased muscle spasms, Pain, Decreased balance, Hypomobility, Impaired flexibility, Improper body mechanics, Decreased mobility, Decreased strength, Decreased activity tolerance  Visit Diagnosis: Chronic bilateral low back pain without sciatica - Plan: PT plan of care cert/re-cert  Pain in right hip - Plan: PT plan of care cert/re-cert  Muscle weakness (generalized) - Plan: PT plan of care cert/re-cert     Problem List Patient Active Problem List   Diagnosis Date Noted  . Tear of left gluteus medius tendon 11/08/2017  . Primary osteoarthritis of both hips 11/08/2017  . Vitamin D deficiency 04/09/2017  . Overactive bladder 04/09/2017  . History of skin cancer 04/09/2017  . Back pain, thoracic 12/19/2016  . Long term (current) use of anticoagulants 12/14/2016  . Recurrent major depression in full remission ([place]) 10/02/2016  . Lump of skin 04/23/2016  . Bilateral hip pain 04/06/2016  . Carpal tunnel syndrome on both sides 01/24/2016  . Right foot pain 10/05/2015  . Tendonitis of ankle, left 10/03/2015  . Hypothyroidism 02/01/2014  . Allergic  rhinitis 02/01/2014  . Bone pain 02/18/2013  . Multiple myeloma in remission ([place]) 07/10/2012  . Back pain, lumbosacral 02/11/2012  . Esophageal reflux 05/31/2008  . History of duodenal ulcer 01/05/2008  . PROSTATE CANCER, HX OF 01/05/2008  . Pulmonary embolism ([place]) 01/05/2008    Earlie Counts, PT 05/07/18 11:14 PM   [facility] [address], [place], [place], [address] Phone: [phone]   Fax:  [phone]  Name: RIGOBERTO REPASS MRN: [id number] Date of Birth: 11-27-1940

## 2018-05-09 DIAGNOSIS — N3281 Overactive bladder: Secondary | ICD-10-CM | POA: Diagnosis not present

## 2018-05-09 DIAGNOSIS — R3915 Urgency of urination: Secondary | ICD-10-CM | POA: Diagnosis not present

## 2018-05-13 ENCOUNTER — Ambulatory Visit: Payer: Medicare Other

## 2018-05-13 ENCOUNTER — Telehealth: Payer: Self-pay

## 2018-05-13 NOTE — Telephone Encounter (Signed)
PT called pt due to missed appt.  He thought his appointment was at 2:30.  Pt will be on the waitlist and we will call if another opening comes available.

## 2018-05-15 ENCOUNTER — Other Ambulatory Visit: Payer: Self-pay

## 2018-05-15 ENCOUNTER — Ambulatory Visit: Payer: Medicare Other | Admitting: Physical Therapy

## 2018-05-15 ENCOUNTER — Encounter: Payer: Self-pay | Admitting: Physical Therapy

## 2018-05-15 DIAGNOSIS — M545 Low back pain, unspecified: Secondary | ICD-10-CM

## 2018-05-15 DIAGNOSIS — M6281 Muscle weakness (generalized): Secondary | ICD-10-CM | POA: Diagnosis not present

## 2018-05-15 DIAGNOSIS — G8929 Other chronic pain: Secondary | ICD-10-CM

## 2018-05-15 DIAGNOSIS — M25551 Pain in right hip: Secondary | ICD-10-CM | POA: Diagnosis not present

## 2018-05-15 NOTE — Patient Instructions (Signed)
Access Code: [email or web address]  URL: [email or web address]  Date: 05/15/2018  Prepared by: Jari Favre   Exercises  Hooklying Single Knee to Chest Stretch - 5 reps - 1 sets - 20 sec hold - 1x daily - 7x weekly  Supine Figure 4 Piriformis Stretch - 3 reps - 1 sets - 30 sec hold - 1x daily - 7x weekly

## 2018-05-15 NOTE — Therapy (Signed)
[facility] [address] [place], [place], [address] Phone: [phone]   Fax:  [phone]  Physical Therapy Treatment  Patient Details  Name: Rodney [name] MRN: [id number] Date of Birth: Jan 31, 1941 Referring Provider (PT): Dr. Teresa Coombs   Encounter Date: 05/15/2018  PT End of Session - 05/15/18 1440    Visit Number  2    Date for PT Re-Evaluation  07/02/18    Authorization Type  [employer] tricare    PT Start Time  1440    PT Stop Time  1520    PT Time Calculation (min)  40 min    Activity Tolerance  Patient tolerated treatment well;Patient limited by pain    Behavior During Therapy  [facility] for tasks assessed/performed       Past Medical History:  Diagnosis Date  . Anemia 02-16-11   01-05-11- post surgery-Transfusions x 4 units  . Arthritis   . Bone pain 02/18/2013  . Clotting disorder ([place])   . Complication of anesthesia 02-16-11   Pt. speaks of awakening during the surgery from back  surgery  . Degenerative disc disease 02-16-11   01-05-11 L3 fusion done  . Esophageal reflux 05/31/2008   Qualifier: Diagnosis of  By: Arnoldo Morale MD, Balinda Quails   . Hernia 02-16-11   left inguinal hernia at present  . Hypothyroidism 02/01/2014  . Multiple myeloma 02-16-11   suspected tumor  left sacral  . Pancreatitis   . Personal history of traumatic fracture 07/10/2012  . Prostate cancer ([place])   . Pulmonary embolism ([place])   . Second degree atrioventricular block 11/26/2012  . Shortness of breath 02-16-11   hx. Pulmonary emboli x2 (9 yrs/ 3'12 -last)    Past Surgical History:  Procedure Laterality Date  . APPENDECTOMY    . CHOLECYSTECTOMY  04/06/2010   laparoscopic-inflammation with stones  . INGUINAL HERNIA REPAIR  02/20/2011   Procedure: HERNIA REPAIR INGUINAL ADULT;  Surgeon: Earnstine Regal, MD;  Location: [facility];  Service: General;  Laterality: Left;  Repair Left Inguinal Hernia with Mesh  . LUMBAR DISC SURGERY  02-16-11     01-05-11 Lumbar surgery L3(complicated by loss of blood volume)/ 01-09-11 then Lumbar fusion done with retained  hardware   . PROSTATE SURGERY Bilateral 2011  . TONSILLECTOMY AND ADENOIDECTOMY      There were no vitals filed for this visit.  Subjective Assessment - 05/15/18 1445    Subjective  Pt states he has foot drop on his Rt side and that is why he has fallen a couple of times.      Diagnostic tests  MRI showed atrophy of right gluteal    Patient Stated Goals  Decreased pain     Currently in Pain?  Yes    Pain Score  4     Pain Location  Back    Pain Orientation  Right;Left;Lower    Pain Descriptors / Indicators  Aching    Pain Type  Chronic pain    Pain Onset  More than a month ago    Pain Frequency  Intermittent    Multiple Pain Sites  No                       [facility] Adult PT Treatment/Exercise - 05/15/18 0001      Self-Care   Self-Care  Other Self-Care Comments    Other Self-Care Comments   log roll educated and performed; cues to exhale  Neuro Re-ed    Neuro Re-ed Details   diaphragmatic breathing with PROM      Exercises   Exercises  Lumbar      Lumbar Exercises: Stretches   Passive Hamstring Stretch  Right;Left;1 rep;30 seconds    Lower Trunk Rotation  5 reps   very small ROM increased pain slightly   Figure 4 Stretch  1 rep;60 seconds    Other Lumbar Stretch Exercise  single knee to chest 3 x 10 sec      Lumbar Exercises: Aerobic   Nustep  L4 x 6 min   PT present for status update     Manual Therapy   Manual Therapy  Passive ROM;Joint mobilization    Joint Mobilization  gentle hip traction mobs; cervical traction into flexion for passive nerve glide    Passive ROM  knee flexion              PT Education - 05/15/18 1530    Education Details   Access Code: [email or web address]     Person(s) Educated  Patient    Methods  Explanation;Demonstration;Handout;Verbal cues    Comprehension  Verbalized understanding;Returned demonstration        PT Short Term Goals - 05/07/18 2310      PT SHORT TERM GOAL #1   Title  independent with initial HEP    Time  4    Period  Weeks    Status  New    Target Date  06/04/18        PT Long Term Goals - 05/07/18 1550      PT LONG TERM GOAL #1   Title  independent with HEP     Time  8    Period  Weeks    Status  New    Target Date  07/02/18      PT LONG TERM GOAL #2   Title  understand ways to manage back pain to continue working and daily activities    Time  8    Period  Weeks    Status  New    Target Date  07/02/18      PT LONG TERM GOAL #3   Title  educated on a aquatic program to strengthen his back and legs with protecting his spine    Time  8    Period  Weeks    Status  New    Target Date  07/02/18      PT LONG TERM GOAL #4   Title  understand correct body mechanics while at [employer] to reduce strain on his spine and hips    Time  8    Period  Weeks    Status  New    Target Date  07/02/18            Plan - 05/15/18 1529    Clinical Impression Statement  Pt reports easier time standing at the end of treatment.  He was able to correctly perform supine to sit except holding his breath and needed cues to exhale.  Pt was very painful with movements.  Tight hamstrings, piriformis and lumbar paraspinals.    PT Treatment/Interventions  ADLs/Self Care Home Management;Cryotherapy;Functional mobility training;Stair training;Gait training;Moist Heat;Traction;Therapeutic activities;Therapeutic exercise;Balance training;Neuromuscular re-education;Patient/family education;Passive range of motion;Manual techniques    PT Next Visit Plan  work on trunk stabilizaton; bed mobility with correct breathing and core bracing; gentle stretches    PT Home Exercise Plan   Access Code: [email or web address]     Consulted  and Agree with Plan of Care  Patient       Patient will benefit from skilled therapeutic intervention in order to improve the following deficits and impairments:  Abnormal  gait, Decreased range of motion, Difficulty walking, Increased muscle spasms, Pain, Decreased balance, Hypomobility, Impaired flexibility, Improper body mechanics, Decreased mobility, Decreased strength, Decreased activity tolerance  Visit Diagnosis: Chronic bilateral low back pain without sciatica  Pain in right hip  Muscle weakness (generalized)     Problem List Patient Active Problem List   Diagnosis Date Noted  . Tear of left gluteus medius tendon 11/08/2017  . Primary osteoarthritis of both hips 11/08/2017  . Vitamin D deficiency 04/09/2017  . Overactive bladder 04/09/2017  . History of skin cancer 04/09/2017  . Back pain, thoracic 12/19/2016  . Long term (current) use of anticoagulants 12/14/2016  . Recurrent major depression in full remission ([place]) 10/02/2016  . Lump of skin 04/23/2016  . Bilateral hip pain 04/06/2016  . Carpal tunnel syndrome on both sides 01/24/2016  . Right foot pain 10/05/2015  . Tendonitis of ankle, left 10/03/2015  . Hypothyroidism 02/01/2014  . Allergic rhinitis 02/01/2014  . Bone pain 02/18/2013  . Multiple myeloma in remission ([place]) 07/10/2012  . Back pain, lumbosacral 02/11/2012  . Esophageal reflux 05/31/2008  . History of duodenal ulcer 01/05/2008  . PROSTATE CANCER, HX OF 01/05/2008  . Pulmonary embolism ([place]) 01/05/2008    Jule Ser, PT 05/15/2018, 3:31 PM  [facility] [address], [place], [place], [address] Phone: [phone]   Fax:  [phone]  Name: Rodney [name] MRN: [id number] Date of Birth: [birth year]

## 2018-05-16 ENCOUNTER — Ambulatory Visit: Payer: Medicare Other | Admitting: Physical Therapy

## 2018-05-20 ENCOUNTER — Other Ambulatory Visit: Payer: Self-pay | Admitting: Family Medicine

## 2018-05-21 ENCOUNTER — Other Ambulatory Visit: Payer: Self-pay

## 2018-05-21 ENCOUNTER — Ambulatory Visit: Payer: Medicare Other

## 2018-05-21 ENCOUNTER — Telehealth: Payer: Self-pay

## 2018-05-21 ENCOUNTER — Ambulatory Visit: Payer: Medicare Other | Admitting: Physical Therapy

## 2018-05-21 DIAGNOSIS — M545 Low back pain, unspecified: Secondary | ICD-10-CM

## 2018-05-21 DIAGNOSIS — G8929 Other chronic pain: Secondary | ICD-10-CM | POA: Diagnosis not present

## 2018-05-21 DIAGNOSIS — M25551 Pain in right hip: Secondary | ICD-10-CM

## 2018-05-21 DIAGNOSIS — M6281 Muscle weakness (generalized): Secondary | ICD-10-CM | POA: Diagnosis not present

## 2018-05-21 NOTE — Patient Instructions (Signed)
   Lifting Principles  Maintain proper posture and head alignment. Slide object as close as possible before lifting. Move obstacles out of the way. Test before lifting; ask for help if too heavy. Tighten stomach muscles without holding breath. Use smooth movements; do not jerk. Use legs to do the work, and pivot with feet. Distribute the work load symmetrically and close to the center of trunk. Push instead of pull whenever possible.   Squat down and hold basket close to stand. Use leg muscles to do the work.    Avoid twisting or bending back. Pivot around using foot movements, and bend at knees if needed when reaching for articles.        Getting Into / Out of Bed   Lower self to lie down on one side by raising legs and lowering head at the same time. Use arms to assist moving without twisting. Bend both knees to roll onto back if desired. To sit up, start from lying on side, and use same move-ments in reverse. Keep trunk aligned with legs.    Shift weight from front foot to back foot as item is lifted off shelf.    When leaning forward to pick object up from floor, extend one leg out behind. Keep back straight. Hold onto a sturdy support with other hand.      Sit upright, head facing forward. Try using a roll to support lower back. Keep shoulders relaxed, and avoid rounded back. Keep hips level with knees. Avoid crossing legs for long periods.     [facility] [address], [address] , [place] [address] Phone # [phone] Fax [phone]  

## 2018-05-21 NOTE — Telephone Encounter (Signed)
PT called pt due to no-show appointment at 3:30.  He didn't realize he was late.  PT agreed to see pt at 4:15.

## 2018-05-21 NOTE — Therapy (Addendum)
[facility] [address] [place], [place], [address] Phone: [phone]   Fax:  [phone]  Physical Therapy Treatment  Patient Details  Name: Rodney [name] MRN: [id number] Date of Birth: 06-13-1940 Referring Provider (PT): Dr. Teresa Coombs   Encounter Date: 05/21/2018  PT End of Session - 05/21/18 1654    Visit Number  3    Date for PT Re-Evaluation  07/02/18    Authorization Type  [employer] tricare    PT Start Time  9937    PT Stop Time  1657    PT Time Calculation (min)  44 min    Activity Tolerance  Patient tolerated treatment well;Patient limited by pain    Behavior During Therapy  [facility] for tasks assessed/performed       Past Medical History:  Diagnosis Date  . Anemia 02-16-11   01-05-11- post surgery-Transfusions x 4 units  . Arthritis   . Bone pain 02/18/2013  . Clotting disorder ([place])   . Complication of anesthesia 02-16-11   Pt. speaks of awakening during the surgery from back  surgery  . Degenerative disc disease 02-16-11   01-05-11 L3 fusion done  . Esophageal reflux 05/31/2008   Qualifier: Diagnosis of  By: Arnoldo Morale MD, Balinda Quails   . Hernia 02-16-11   left inguinal hernia at present  . Hypothyroidism 02/01/2014  . Multiple myeloma 02-16-11   suspected tumor  left sacral  . Pancreatitis   . Personal history of traumatic fracture 07/10/2012  . Prostate cancer ([place])   . Pulmonary embolism ([place])   . Second degree atrioventricular block 11/26/2012  . Shortness of breath 02-16-11   hx. Pulmonary emboli x2 (9 yrs/ 3'12 -last)    Past Surgical History:  Procedure Laterality Date  . APPENDECTOMY    . CHOLECYSTECTOMY  04/06/2010   laparoscopic-inflammation with stones  . INGUINAL HERNIA REPAIR  02/20/2011   Procedure: HERNIA REPAIR INGUINAL ADULT;  Surgeon: Earnstine Regal, MD;  Location: [facility];  Service: General;  Laterality: Left;  Repair Left Inguinal Hernia with Mesh  . LUMBAR DISC SURGERY  02-16-11     01-05-11 Lumbar surgery L3(complicated by loss of blood volume)/ 01-09-11 then Lumbar fusion done with retained  hardware   . PROSTATE SURGERY Bilateral 2011  . TONSILLECTOMY AND ADENOIDECTOMY      There were no vitals filed for this visit.  Subjective Assessment - 05/21/18 1615    Subjective  I have been really busy at work so I have been sore.  I felt pretty good after I was here last time and then it incresaed again.      Currently in Pain?  Yes    Pain Score  6     Pain Location  Back    Pain Orientation  Right;Lower    Pain Descriptors / Indicators  Aching    Pain Type  Chronic pain    Pain Onset  More than a month ago    Pain Frequency  Intermittent    Aggravating Factors   work, as the day progresses, rolling in bed    Pain Relieving Factors  meditation, heat                       [facility] Adult PT Treatment/Exercise - 05/21/18 0001      Lumbar Exercises: Stretches   Active Hamstring Stretch  Right;3 reps;20 seconds    Single Knee to Chest Stretch  Left;Right;3 reps;20 seconds  Lower Trunk Rotation  5 reps;10 seconds   more ROM today   Figure 4 Stretch  3 reps;20 seconds      Lumbar Exercises: Aerobic   Nustep  L4 x 10 min   PT present for status update     Lumbar Exercises: Seated   Long Arc Quad on Chair  Strengthening;Right;2 sets;10 reps    Sit to Stand  Other (comment)   attempted- too much pain     Lumbar Exercises: Supine   Clam  20 reps    Clam Limitations  red band      Manual Therapy   Manual Therapy  Passive ROM;Joint mobilization    Joint Mobilization  gentle hip traction, long axis.  PROM into ER with overpressure             PT Education - 05/21/18 1635    Education Details  body mechanics education    Person(s) Educated  Patient    Methods  Explanation;Demonstration;Handout    Comprehension  Verbalized understanding       PT Short Term Goals - 05/07/18 2310      PT SHORT TERM GOAL #1   Title  independent with  initial HEP    Time  4    Period  Weeks    Status  New    Target Date  06/04/18        PT Long Term Goals - 05/07/18 1550      PT LONG TERM GOAL #1   Title  independent with HEP     Time  8    Period  Weeks    Status  New    Target Date  07/02/18      PT LONG TERM GOAL #2   Title  understand ways to manage back pain to continue working and daily activities    Time  8    Period  Weeks    Status  New    Target Date  07/02/18      PT LONG TERM GOAL #3   Title  educated on a aquatic program to strengthen his back and legs with protecting his spine    Time  8    Period  Weeks    Status  New    Target Date  07/02/18      PT LONG TERM GOAL #4   Title  understand correct body mechanics while at [employer] to reduce strain on his spine and hips    Time  8    Period  Weeks    Status  New    Target Date  07/02/18            Plan - 05/21/18 1621    Clinical Impression Statement  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  Pt reports that he has not been consistent with HEP at this time.  Pt required frequent verbal cues for technique with exercise in the clinic today.  Pt tolerated advancement of gentle strength exercises for Rt gluteals today.  Pt is guarded and requires time for slow transition into position. Pt will continue to benefit from skilled PT for Rt hip flexibility, nerve glides, strength and manual.      Comorbidities  multple myleloma 02/16/11; Mild bilateral trochanteric bursitis; Lumbar spondylosis L4-L5,L5-S1; Bilateral Canal Stenosis; Atrophy of right gluteals    Rehab Potential  Good    PT Frequency  2x / week  PT Duration  8 weeks    PT Treatment/Interventions  ADLs/Self Care Home Management;Cryotherapy;Functional mobility training;Stair training;Gait training;Moist Heat;Traction;Therapeutic activities;Therapeutic exercise;Balance training;Neuromuscular re-education;Patient/family education;Passive range of  motion;Manual techniques    PT Next Visit Plan  work on trunk stabilizaton; bed mobility with correct breathing and core bracing; gentle stretches.  Add to HEP    PT Home Exercise Plan   Access Code: [email or web address]     Recommended Other Services  initial cert is signed    Consulted and Agree with Plan of Care  Patient       Patient will benefit from skilled therapeutic intervention in order to improve the following deficits and impairments:  Abnormal gait, Decreased range of motion, Difficulty walking, Increased muscle spasms, Pain, Decreased balance, Hypomobility, Impaired flexibility, Improper body mechanics, Decreased mobility, Decreased strength, Decreased activity tolerance  Visit Diagnosis: Chronic bilateral low back pain without sciatica  Pain in right hip  Muscle weakness (generalized)     Problem List Patient Active Problem List   Diagnosis Date Noted  . Tear of left gluteus medius tendon 11/08/2017  . Primary osteoarthritis of both hips 11/08/2017  . Vitamin D deficiency 04/09/2017  . Overactive bladder 04/09/2017  . History of skin cancer 04/09/2017  . Back pain, thoracic 12/19/2016  . Long term (current) use of anticoagulants 12/14/2016  . Recurrent major depression in full remission ([place]) 10/02/2016  . Lump of skin 04/23/2016  . Bilateral hip pain 04/06/2016  . Carpal tunnel syndrome on both sides 01/24/2016  . Right foot pain 10/05/2015  . Tendonitis of ankle, left 10/03/2015  . Hypothyroidism 02/01/2014  . Allergic rhinitis 02/01/2014  . Bone pain 02/18/2013  . Multiple myeloma in remission ([place]) 07/10/2012  . Back pain, lumbosacral 02/11/2012  . Esophageal reflux 05/31/2008  . History of duodenal ulcer 01/05/2008  . PROSTATE CANCER, HX OF 01/05/2008  . Pulmonary embolism ([place]) 01/05/2008    Sigurd Sos, PT 05/21/18 4:58 PM PHYSICAL THERAPY DISCHARGE SUMMARY  Visits from Start of Care: 3  Current functional level related to goals / functional  outcomes: Pt was not able to be contacted since last session.  Our clinic was closed related to [place] and was not able to be reached during this time.     Remaining deficits: See above for most current PT status.     Education / Equipment: HEP Plan: Patient agrees to discharge.  Patient goals were not met. Patient is being discharged due to                                                     ?????Clinic closure associated with COVID19.  Sigurd Sos, PT 07/07/18 3:24 PM       [facility] [address], [place], [place], [address] Phone: [phone]   Fax:  [phone]  Name: Rodney [name] MRN: [id number] Date of Birth: 09/30/1940

## 2018-05-26 ENCOUNTER — Encounter: Payer: Self-pay | Admitting: Physical Therapy

## 2018-05-26 DIAGNOSIS — R6889 Other general symptoms and signs: Secondary | ICD-10-CM | POA: Diagnosis not present

## 2018-05-26 DIAGNOSIS — C9000 Multiple myeloma not having achieved remission: Secondary | ICD-10-CM | POA: Diagnosis not present

## 2018-05-27 ENCOUNTER — Ambulatory Visit: Payer: Medicare Other | Admitting: Physical Therapy

## 2018-05-28 ENCOUNTER — Ambulatory Visit: Payer: Medicare Other

## 2018-05-30 ENCOUNTER — Encounter: Payer: Self-pay | Admitting: Physical Therapy

## 2018-06-02 ENCOUNTER — Ambulatory Visit: Payer: Medicare Other | Admitting: Sports Medicine

## 2018-06-02 ENCOUNTER — Encounter: Payer: Self-pay | Admitting: Physical Therapy

## 2018-06-04 ENCOUNTER — Other Ambulatory Visit: Payer: Self-pay

## 2018-06-04 ENCOUNTER — Encounter: Payer: Self-pay | Admitting: Physical Therapy

## 2018-06-04 ENCOUNTER — Ambulatory Visit (INDEPENDENT_AMBULATORY_CARE_PROVIDER_SITE_OTHER): Payer: Medicare Other | Admitting: General Practice

## 2018-06-04 DIAGNOSIS — Z7901 Long term (current) use of anticoagulants: Secondary | ICD-10-CM | POA: Diagnosis not present

## 2018-06-04 LAB — POCT INR: INR: 3 (ref 2.0–3.0)

## 2018-06-04 NOTE — Patient Instructions (Incomplete)
Pre visit review using our clinic review tool, if applicable. No additional management support is needed unless otherwise documented below in the visit note.  Take 1/2 tablet today and then continue to take 1 tablet daily except 1/2 tablet on Mondays.  Re-check in 6 weeks.

## 2018-06-04 NOTE — Progress Notes (Signed)
I have reviewed and agree with note, evaluation, plan.   Stephen Hunter, MD  

## 2018-06-09 ENCOUNTER — Encounter: Payer: Self-pay | Admitting: Physical Therapy

## 2018-06-11 ENCOUNTER — Encounter: Payer: Self-pay | Admitting: Physical Therapy

## 2018-06-16 ENCOUNTER — Encounter: Payer: Self-pay | Admitting: Physical Therapy

## 2018-06-20 ENCOUNTER — Encounter: Payer: Self-pay | Admitting: Physical Therapy

## 2018-06-23 ENCOUNTER — Ambulatory Visit: Payer: Self-pay | Admitting: Physical Therapy

## 2018-06-24 ENCOUNTER — Other Ambulatory Visit: Payer: Self-pay | Admitting: Family Medicine

## 2018-06-25 NOTE — Telephone Encounter (Signed)
Noted  

## 2018-06-25 NOTE — Telephone Encounter (Signed)
Okay for refill?  

## 2018-06-30 ENCOUNTER — Other Ambulatory Visit: Payer: Self-pay | Admitting: Family Medicine

## 2018-07-16 ENCOUNTER — Other Ambulatory Visit: Payer: Self-pay

## 2018-07-16 ENCOUNTER — Ambulatory Visit: Payer: Medicare Other

## 2018-07-16 ENCOUNTER — Ambulatory Visit (INDEPENDENT_AMBULATORY_CARE_PROVIDER_SITE_OTHER): Payer: Medicare Other | Admitting: General Practice

## 2018-07-16 DIAGNOSIS — Z7901 Long term (current) use of anticoagulants: Secondary | ICD-10-CM

## 2018-07-16 LAB — POCT INR: INR: 4.5 — AB (ref 2.0–3.0)

## 2018-07-16 NOTE — Patient Instructions (Signed)
Pre visit review using our clinic review tool, if applicable. No additional management support is needed unless otherwise documented below in the visit note.  Skip coumadin today and tomorrow and then change dosage and take 1 tablet daily except 1/2 tablet on Mondays and Fridays.  Re-check in 4 weeks.

## 2018-07-16 NOTE — Progress Notes (Signed)
I have reviewed the results and agree with this plan   

## 2018-08-13 ENCOUNTER — Ambulatory Visit (INDEPENDENT_AMBULATORY_CARE_PROVIDER_SITE_OTHER): Payer: Medicare Other | Admitting: General Practice

## 2018-08-13 ENCOUNTER — Other Ambulatory Visit: Payer: Self-pay

## 2018-08-13 DIAGNOSIS — Z7901 Long term (current) use of anticoagulants: Secondary | ICD-10-CM | POA: Diagnosis not present

## 2018-08-13 LAB — POCT INR: INR: 2 (ref 2.0–3.0)

## 2018-08-13 NOTE — Progress Notes (Signed)
I have reviewed the results and agree with this plan   

## 2018-08-13 NOTE — Patient Instructions (Addendum)
Pre visit review using our clinic review tool, if applicable. No additional management support is needed unless otherwise documented below in the visit note.  Skip coumadin today and tomorrow and then change dosage and take 1 tablet daily except 1/2 tablet on Mondays and Fridays.  Re-check in 4 weeks.

## 2018-08-18 ENCOUNTER — Other Ambulatory Visit: Payer: Self-pay | Admitting: Family Medicine

## 2018-08-18 NOTE — Telephone Encounter (Signed)
Last OV 06/13/17 Last refill 08/22/17 #90/3 Next OV 10/14/18

## 2018-08-19 ENCOUNTER — Telehealth: Payer: Self-pay | Admitting: Physical Therapy

## 2018-08-19 NOTE — Telephone Encounter (Signed)
Looks to be in normal range as of June 10   As FYI for future-with requests like this can review chart- patient just had INR check on June 10 and INR was in appropriate range at 2.0.  Can also ask Jenny Reichmann if needed.  I can show you how to look at INR visits if you would like Lab Results  Component Value Date   INR 2.0 08/13/2018   INR 4.5 (A) 07/16/2018   INR 3.0 06/04/2018   PROTIME 25.2 (H) 02/28/2012   PROTIME 45.6 (H) 01/18/2012   PROTIME 42.0 (H) 01/11/2012

## 2018-08-19 NOTE — Telephone Encounter (Signed)
Copied from [place] [id number]. Topic: General - Other >> Aug 19, 2018 12:01 PM Rayann Heman wrote: Reason for CRM: pt son DR Shawn Stall called and stated that he will need to do some dental work on his dad and wanted to make sure that INR was good before he did anything . Please advise  Office [phone] please call first

## 2018-08-20 NOTE — Telephone Encounter (Signed)
Called and spoke to [place] at Dr. Macario Golds office and relayed message that pt's INR is in a normal range as of 08/13/18.

## 2018-09-15 DIAGNOSIS — R6889 Other general symptoms and signs: Secondary | ICD-10-CM | POA: Diagnosis not present

## 2018-09-15 DIAGNOSIS — C9000 Multiple myeloma not having achieved remission: Secondary | ICD-10-CM | POA: Diagnosis not present

## 2018-09-25 ENCOUNTER — Ambulatory Visit (INDEPENDENT_AMBULATORY_CARE_PROVIDER_SITE_OTHER): Payer: Medicare Other | Admitting: Family Medicine

## 2018-09-25 ENCOUNTER — Encounter: Payer: Self-pay | Admitting: Family Medicine

## 2018-09-25 ENCOUNTER — Other Ambulatory Visit: Payer: Self-pay | Admitting: Family Medicine

## 2018-09-25 ENCOUNTER — Other Ambulatory Visit: Payer: Self-pay

## 2018-09-25 VITALS — BP 110/70 | HR 69 | Temp 99.1°F | Ht 68.75 in | Wt 200.8 lb

## 2018-09-25 DIAGNOSIS — Z23 Encounter for immunization: Secondary | ICD-10-CM | POA: Diagnosis not present

## 2018-09-25 DIAGNOSIS — E781 Pure hyperglyceridemia: Secondary | ICD-10-CM

## 2018-09-25 DIAGNOSIS — L570 Actinic keratosis: Secondary | ICD-10-CM

## 2018-09-25 DIAGNOSIS — I2782 Chronic pulmonary embolism: Secondary | ICD-10-CM

## 2018-09-25 DIAGNOSIS — Z8546 Personal history of malignant neoplasm of prostate: Secondary | ICD-10-CM

## 2018-09-25 DIAGNOSIS — E559 Vitamin D deficiency, unspecified: Secondary | ICD-10-CM

## 2018-09-25 DIAGNOSIS — F3342 Major depressive disorder, recurrent, in full remission: Secondary | ICD-10-CM

## 2018-09-25 DIAGNOSIS — E039 Hypothyroidism, unspecified: Secondary | ICD-10-CM

## 2018-09-25 DIAGNOSIS — C9001 Multiple myeloma in remission: Secondary | ICD-10-CM

## 2018-09-25 DIAGNOSIS — S50312A Abrasion of left elbow, initial encounter: Secondary | ICD-10-CM

## 2018-09-25 DIAGNOSIS — Z0000 Encounter for general adult medical examination without abnormal findings: Secondary | ICD-10-CM | POA: Diagnosis not present

## 2018-09-25 DIAGNOSIS — E663 Overweight: Secondary | ICD-10-CM

## 2018-09-25 DIAGNOSIS — Z9484 Stem cells transplant status: Secondary | ICD-10-CM | POA: Diagnosis not present

## 2018-09-25 DIAGNOSIS — L97512 Non-pressure chronic ulcer of other part of right foot with fat layer exposed: Secondary | ICD-10-CM

## 2018-09-25 DIAGNOSIS — Z79899 Other long term (current) drug therapy: Secondary | ICD-10-CM | POA: Diagnosis not present

## 2018-09-25 LAB — COMPREHENSIVE METABOLIC PANEL
ALT: 16 U/L (ref 0–53)
AST: 20 U/L (ref 0–37)
Albumin: 4.2 g/dL (ref 3.5–5.2)
Alkaline Phosphatase: 94 U/L (ref 39–117)
BUN: 22 mg/dL (ref 6–23)
CO2: 27 mEq/L (ref 19–32)
Calcium: 9 mg/dL (ref 8.4–10.5)
Chloride: 103 mEq/L (ref 96–112)
Creatinine, Ser: 1.26 mg/dL (ref 0.40–1.50)
GFR: 55.64 mL/min — ABNORMAL LOW (ref >60.00)
Glucose, Bld: 81 mg/dL (ref 70–99)
Potassium: 4.8 mEq/L (ref 3.5–5.1)
Sodium: 136 mEq/L (ref 135–145)
Total Bilirubin: 0.5 mg/dL (ref 0.2–1.2)
Total Protein: 6.7 g/dL (ref 6.0–8.3)

## 2018-09-25 LAB — CBC
HCT: 40.5 % (ref 39.0–52.0)
Hemoglobin: 13.9 g/dL (ref 13.0–17.0)
MCHC: 34.3 g/dL (ref 30.0–36.0)
MCV: 98.4 fl (ref 78.0–100.0)
Platelets: 184 10*3/uL (ref 150.0–400.0)
RBC: 4.11 Mil/uL — ABNORMAL LOW (ref 4.22–5.81)
RDW: 13.5 % (ref 11.5–15.5)
WBC: 6.1 10*3/uL (ref 4.0–10.5)

## 2018-09-25 LAB — LIPID PANEL
Cholesterol: 151 mg/dL (ref 0–200)
HDL: 36.1 mg/dL — ABNORMAL LOW (ref >39.00)
NonHDL: 114.89
Total CHOL/HDL Ratio: 4
Triglycerides: 308 mg/dL — ABNORMAL HIGH (ref 0.0–149.0)
VLDL: 61.6 mg/dL — ABNORMAL HIGH (ref 0.0–40.0)

## 2018-09-25 LAB — PSA: PSA: 0 ng/mL — ABNORMAL LOW (ref 0.10–4.00)

## 2018-09-25 LAB — TSH: TSH: 7 u[IU]/mL — ABNORMAL HIGH (ref 0.35–4.50)

## 2018-09-25 LAB — LDL CHOLESTEROL, DIRECT: Direct LDL: 76 mg/dL

## 2018-09-25 LAB — VITAMIN D 25 HYDROXY (VIT D DEFICIENCY, FRACTURES): VITD: 61.12 ng/mL (ref 30.00–100.00)

## 2018-09-25 LAB — VITAMIN B12: Vitamin B-12: 374 pg/mL (ref 211–911)

## 2018-09-25 MED ORDER — CITALOPRAM HYDROBROMIDE 10 MG PO TABS: 10.0000 mg | ORAL_TABLET | Freq: Every day | ORAL | 3 refills | Status: DC

## 2018-09-25 NOTE — Patient Instructions (Addendum)
Health Maintenance Due  Topic Date Due  . shingrix-  Please stop by your local pharmacy 09/17/1959   Td today under abrasion left elbow from 2 weeks ago  Please stop by lab before you go If you do not have mychart- we will call you about results within 5 business days of [place] receiving them.  If you have mychart- we will send your results within 3 business days of [place] receiving them.  If abnormal or we want to clarify a result, we will call or mychart you to make sure you receive the message.  If you have questions or concerns or don't hear within 5-7 days, please send [place] a message or call us.

## 2018-09-25 NOTE — Progress Notes (Signed)
Phone: [phone]   Subjective:  Patient presents today for their annual physical. Chief complaint-noted.   See problem oriented charting- ROS- full  review of systems was completed and negative except for: right ear pain, post nasal drip, runny nose, sneezing from allergies, eye itching  The following were reviewed and entered/updated in epic: Past Medical History:  Diagnosis Date  . Anemia 02-16-11   01-05-11- post surgery-Transfusions x 4 units  . Arthritis   . Bone pain 02/18/2013  . Clotting disorder (HCC)   . Complication of anesthesia 02-16-11   Pt. speaks of awakening during the surgery from back  surgery  . Degenerative disc disease 02-16-11   01-05-11 L3 fusion done  . Esophageal reflux 05/31/2008   Qualifier: Diagnosis of  By: Jenkins MD, John E   . Hernia 02-16-11   left inguinal hernia at present  . Hypothyroidism 02/01/2014  . Multiple myeloma 02-16-11   suspected tumor  left sacral  . Pancreatitis   . Personal history of traumatic fracture 07/10/2012  . Prostate cancer (HCC)   . Pulmonary embolism (HCC)   . Second degree atrioventricular block 11/26/2012  . Shortness of breath 02-16-11   hx. Pulmonary emboli x2 (9 yrs/ 3'12 -last)   Patient Active Problem List   Diagnosis Date Noted  . Multiple myeloma in remission (HCC) 07/10/2012    Priority: High  . PROSTATE CANCER, HX OF 01/05/2008    Priority: High  . Pulmonary embolism (HCC) 01/05/2008    Priority: High  . Overactive bladder 04/09/2017    Priority: Medium  . History of skin cancer 04/09/2017    Priority: Medium  . Recurrent major depression in full remission (HCC) 10/02/2016    Priority: Medium  . Bilateral hip pain 04/06/2016    Priority: Medium  . Hypothyroidism 02/01/2014    Priority: Medium  . Back pain, lumbosacral 02/11/2012    Priority: Medium  . Esophageal reflux 05/31/2008    Priority: Medium  . Vitamin D deficiency 04/09/2017    Priority: Low  . Back pain, thoracic 12/19/2016   Priority: Low  . Long term (current) use of anticoagulants 12/14/2016    Priority: Low  . Lump of skin 04/23/2016    Priority: Low  . Carpal tunnel syndrome on both sides 01/24/2016    Priority: Low  . Right foot pain 10/05/2015    Priority: Low  . Tendonitis of ankle, left 10/03/2015    Priority: Low  . Allergic rhinitis 02/01/2014    Priority: Low  . Bone pain 02/18/2013    Priority: Low  . History of duodenal ulcer 01/05/2008    Priority: Low  . Stem cells transplant status (HCC) 09/25/2018  . Tear of left gluteus medius tendon 11/08/2017  . Primary osteoarthritis of both hips 11/08/2017   Past Surgical History:  Procedure Laterality Date  . APPENDECTOMY    . CHOLECYSTECTOMY  04/06/2010   laparoscopic-inflammation with stones  . INGUINAL HERNIA REPAIR  02/20/2011   Procedure: HERNIA REPAIR INGUINAL ADULT;  Surgeon: Todd M Gerkin, MD;  Location: [facility];  Service: General;  Laterality: Left;  Repair Left Inguinal Hernia with Mesh  . LUMBAR DISC SURGERY  02-16-11    01-05-11 Lumbar surgery L3(complicated by loss of blood volume)/ 01-09-11 then Lumbar fusion done with retained  hardware   . PROSTATE SURGERY Bilateral 2011  . TONSILLECTOMY AND ADENOIDECTOMY      Family History  Problem Relation Age of Onset  . Lymphoma Father 87  . Clotting disorder Mother          blood clots  . Lung disease Brother   . Diabetes Son   . Diabetes Daughter   . Diabetes Daughter   . Colon cancer Neg Hx   . Esophageal cancer Neg Hx   . Rectal cancer Neg Hx   . Stomach cancer Neg Hx     Medications- reviewed and updated Current Outpatient Medications  Medication Sig Dispense Refill  . calcium carbonate (TUMS - DOSED IN MG ELEMENTAL CALCIUM) 500 MG chewable tablet Chew 1 tablet by mouth every 2 (two) hours as needed. HEART BURN      . Calcium Carbonate-Vitamin D (CALCIUM-D PO) Take by mouth.    . Cholecalciferol (VITAMIN D) 1000 UNITS capsule Take 4,000 Units by mouth daily.     . Coenzyme  Q10 (COQ10 PO) Take by mouth.    . Cyanocobalamin (VITAMIN B12 PO) Take by mouth.    . diazepam (VALIUM) 5 MG tablet TAKE 1 TABLET BY MOUTH EVERY 8 HOURS AS NEEDED FOR ANXIETY OR MUSCLE SPASMS 30 tablet 0  . gabapentin (NEURONTIN) 300 MG capsule TAKE 3 CAPSULES BY MOUTH AT BEDTIME FOR  NERVE  PAIN 270 capsule 1  . KLOR-CON M20 20 MEQ tablet   11  . levothyroxine (SYNTHROID) 100 MCG tablet Take 1 tablet by mouth once daily 90 tablet 0  . mirabegron ER (MYRBETRIQ) 25 MG TB24 tablet Take 25 mg by mouth daily.    . Multiple Vitamins-Minerals (MULTIVITAMIN ADULT PO) Take by mouth.    . nitroGLYCERIN (NITRODUR - DOSED IN MG/24 HR) 0.2 mg/hr patch PLACE 1/4 TO 1/2 OF PATCH OVER AFFECTED REGION. REMOVE AND REPLACE ONCE DAILY. SLIGHTLY ALTER SKIN PLACEMENT DAILY 30 patch 0  . omeprazole (PRILOSEC) 20 MG capsule TAKE 1 CAPSULE BY MOUTH TWICE DAILY 180 capsule 1  . ondansetron (ZOFRAN) 4 MG tablet TAKE ONE TABLET BY MOUTH EVERY 8 HOURS AS NEEDED FOR NAUSEA OR VOMITING 20 tablet 0  . POTASSIUM PO Take 1 tablet by mouth daily.    . Probiotic Product (ALIGN PO) Take by mouth.    . REVLIMID 10 MG capsule Take 10 mg by mouth daily. 21 days on and 7 days off    . tiZANidine (ZANAFLEX) 4 MG tablet Take 1 tablet (4 mg total) by mouth 3 (three) times daily. 90 tablet 1  . warfarin (COUMADIN) 5 MG tablet TAKE 1 TABLET BY MOUTH ONCE DAILY OR  AS  DIRECTED  BY  ANTICOAGULATION  CLINIC 30 tablet 3  . citalopram (CELEXA) 10 MG tablet Take 1 tablet (10 mg total) by mouth daily. 90 tablet 3  . gabapentin (NEURONTIN) 300 MG capsule      No current facility-administered medications for this visit.     Allergies-reviewed and updated Allergies  Allergen Reactions  . Blood-Group Specific Substance     Must be Leukocyte Reduced and Irradiated due to stem cell transplant.     Social History   Social History Narrative   Married. 3 kids. 4 grandkids   Regular exercise-yes      Working at [employer] 35 hours a  week. Considering [employer] again   Objective  Objective:  BP 110/70   Pulse 69   Temp 99.1 F (37.3 C) (Oral)   Ht 5' 8.75" (1.746 m)   Wt 200 lb 12.8 oz (91.1 kg)   SpO2 94%   BMI 29.87 kg/m  Gen: NAD, resting comfortably HEENT: Mucous membranes are moist. Oropharynx normal. Mild rhinorrhea (no recent worsening per patient- ongoing issues with allergies) Neck: no   thyromegaly or cervical lymphadenopathy CV: RRR no murmurs rubs or gallops Lungs: CTAB no crackles, wheeze, rhonchi Abdomen: soft/nontender/nondistended/normal bowel sounds. No rebound or guarding.  Ext: no edema and 2+ PT pulses Skin: warm, dry, 5 lesions concerning for actinic keratosis- 2 on right cheek, 1 on nose, 1 on left cheek and 1 on left forehead -abrasion left forearm noted Neuro: grossly normal, moves all extremities, PERRLA MSK: ulcer including fat on end of right 2nd toe- refer to podiatry  Procedure note: Benefits and risks verbally discussed with patient 5-7 second freeze thaw cycle of cryotherapy performed  with liquid nitrogen to each of 5 AKs noted above No complications.  Patient tolerated the procedure well other than mild pain. Gave handout on this from [facility] and we reviewed this content thoroughly [email or web address]    Assessment and Plan  78 y.o. male presenting for annual physical.  Health Maintenance counseling: 1. Anticipatory guidance: Patient counseled regarding regular dental exams -q6 months, eye exams -yearly,  avoiding smoking and second hand smoke , limiting alcohol to 2 beverages per day - doesn't drink.   2. Risk factor reduction:  Advised patient of need for regular exercise and diet rich and fruits and vegetables to reduce risk of heart attack and stroke. Exercise- walks 3-4 miles a day. Diet-mild increase- some stress with covid 19. Discussed reversing increased weight trend Wt Readings from Last 3 Encounters:   09/25/18 200 lb 12.8 oz (91.1 kg)  04/21/18 196 lb 12.8 oz (89.3 kg)  03/31/18 199 lb (90.3 kg)  3. Immunizations/screenings/ancillary studies- recommend fall flu shot. Due for tetanus (given today with abrasion) and shingrix- can get those at the pharmacy . May defer with covid 19 Immunization History  Administered Date(s) Administered  . DTaP / Hep B / IPV 11/05/2012, 01/16/2013, 05/29/2013  . HiB (PRP-OMP) 11/05/2012, 01/16/2013, 05/29/2013  . Influenza, Seasonal, Injecte, Preservative Fre 03/26/2012, 01/16/2013  . Influenza,inj,Quad PF,6+ Mos 11/18/2013, 12/07/2014  . Influenza-Unspecified 11/04/2014, 12/05/2015, 01/03/2017  . MMR 11/16/2013  . Pneumococcal Conjugate-13 11/05/2012, 01/16/2013, 05/29/2013  . Pneumococcal Polysaccharide-23 11/16/2013  . Td 09/25/2018  4. Prostate cancer screening- history of prostate cancer- will check psa today- does not see urology anymore  Lab Results  Component Value Date   PSA 0.00 (L) 09/25/2018   PSA 0.00 Repeated and verified X2. (L) 09/24/2016   PSA 0.00 Repeated and verified X2. (L) 02/17/2016   5. Colon cancer screening - had colonoscopy 08/2017- no repeat due to age per Dr. Nandigam 6. Skin cancer screening- no dermatologist. advised regular sunscreen use. Denies worrisome, changing, or new skin lesions.  7. former smoker- quit in 1970s. Just aged out of AAA scan  Status of chronic or acute concerns  Multiple Myeloma in remission - now off treatment- saw them last week and was told full remission- prior revlimid for 5 years -received stem cell transplant in the past   Abrasion left forearm- needs updated Td today  Toe ulceration - right 2nd distal toe- will refer to podiatry  Coumadin long term- has had 2 PEs so remains on long term coumadin. INR checks here  GERD - Taking Omeprazole 20 mg BID. Ulcer years ago. Will trial once a day omeprazole- discussed today Lab Results  Component Value Date   VITAMINB12 374 09/25/2018    Hypothyroidism - Taking Levothyroxine 100 mcg daily.   Depression - Taking Citalopram 10 mg daily and Diazepam 5 mg prn. Discussed how omeprazole can increase affect of celexa Depression screen PHQ 2/9 09/25/2018 06/13/2017   04/09/2017  Decreased Interest 0 0 0  Down, Depressed, Hopeless 0 0 0  PHQ - 2 Score 0 0 0  Altered sleeping 0 0 0  Tired, decreased energy 0 0 0  Change in appetite 0 0 0  Feeling bad or failure about yourself  0 0 0  Trouble concentrating 0 0 0  Moving slowly or fidgety/restless 0 0 0  Suicidal thoughts 0 0 0  PHQ-9 Score 0 0 0  Difficult doing work/chores Not difficult at all Not difficult at all -  Some recent data might be hidden   Vitamin D Deficiency - Taking OTC Vitamin D supplement.  Update vitamin D today  Overactive Bladder - taking Myrbetriq ER 25 mg daily. Finds effective  Mild hyperlipidemia- update lipids Lab Results  Component Value Date   CHOL 151 09/25/2018   HDL 36.10 (L) 09/25/2018   LDLCALC 44 02/17/2016   LDLDIRECT 76.0 09/25/2018   TRIG 308.0 (H) 09/25/2018   CHOLHDL 4 09/25/2018   Some seasonal allergies noted- robitussen at night at times- ongoing issue  Actinic keratosis several lesions noted and treated with cryotherapy as above.  1 area patient reports prior cryotherapy with dermatology on right cheek but was retreated today. -advised follow up with dermatology if does not resolve with treatment- has seen in past- he may want new referral as didn't have best experience apparently  Fingertip numbness from carpal tunnel- declines intervention   Recommended follow up: 1 year physical  Lab/Order associations: not fasting    ICD-10-CM   1. Preventative health care  Z00.00   2. Multiple myeloma in remission (HCC)  C90.01   3. Other chronic pulmonary embolism without acute cor pulmonale (HCC)  I27.82   4. Stem cells transplant status (HCC) Chronic Z94.84   5. Recurrent major depression in full remission ([place]) Chronic F33.42   6.  Skin ulcer of toe of right foot with fat layer exposed ([place])  L97.512 Ambulatory referral to Podiatry  7. Vitamin D deficiency  E55.9 VITAMIN D 25 Hydroxy (Vit-D Deficiency, Fractures)  8. Hypothyroidism, unspecified type  E03.9 TSH  9. Hypertriglyceridemia  E78.1 CBC    Lipid panel    Comprehensive metabolic panel  10. PROSTATE CANCER, HX OF  Z85.46 PSA  11. High risk medication use  Z79.899 Vitamin B12  12. Overweight  E66.3   13. Abrasion of left elbow, initial encounter  S50.312A Td vaccine greater than or equal to 7yo preservative free IM   Meds ordered this encounter  Medications  . citalopram (CELEXA) 10 MG tablet    Sig: Take 1 tablet (10 mg total) by mouth daily.    Dispense:  90 tablet    Refill:  3    Return precautions advised.  Garret Reddish, MD

## 2018-09-26 ENCOUNTER — Other Ambulatory Visit: Payer: Self-pay | Admitting: Sports Medicine

## 2018-09-30 ENCOUNTER — Ambulatory Visit: Payer: Medicare Other | Admitting: Podiatry

## 2018-10-01 NOTE — Telephone Encounter (Signed)
Called and LM regarding Zanaflex refill and if he does/does not need this refill.  Asked pt to message [place] back either via MyChart or telephone to let us know if he would like this refill.

## 2018-10-02 ENCOUNTER — Other Ambulatory Visit: Payer: Self-pay

## 2018-10-02 ENCOUNTER — Ambulatory Visit (INDEPENDENT_AMBULATORY_CARE_PROVIDER_SITE_OTHER): Payer: Medicare Other | Admitting: Podiatry

## 2018-10-02 ENCOUNTER — Ambulatory Visit (INDEPENDENT_AMBULATORY_CARE_PROVIDER_SITE_OTHER): Payer: Medicare Other

## 2018-10-02 ENCOUNTER — Encounter: Payer: Self-pay | Admitting: Podiatry

## 2018-10-02 VITALS — BP 126/66

## 2018-10-02 DIAGNOSIS — L97511 Non-pressure chronic ulcer of other part of right foot limited to breakdown of skin: Secondary | ICD-10-CM

## 2018-10-02 DIAGNOSIS — L02611 Cutaneous abscess of right foot: Secondary | ICD-10-CM | POA: Diagnosis not present

## 2018-10-02 DIAGNOSIS — B351 Tinea unguium: Secondary | ICD-10-CM | POA: Diagnosis not present

## 2018-10-02 DIAGNOSIS — S99921A Unspecified injury of right foot, initial encounter: Secondary | ICD-10-CM | POA: Diagnosis not present

## 2018-10-02 DIAGNOSIS — L97514 Non-pressure chronic ulcer of other part of right foot with necrosis of bone: Secondary | ICD-10-CM

## 2018-10-02 DIAGNOSIS — M79675 Pain in left toe(s): Secondary | ICD-10-CM | POA: Diagnosis not present

## 2018-10-02 DIAGNOSIS — Z7901 Long term (current) use of anticoagulants: Secondary | ICD-10-CM | POA: Diagnosis not present

## 2018-10-02 DIAGNOSIS — L03031 Cellulitis of right toe: Secondary | ICD-10-CM

## 2018-10-02 DIAGNOSIS — M79674 Pain in right toe(s): Secondary | ICD-10-CM

## 2018-10-02 DIAGNOSIS — M79671 Pain in right foot: Secondary | ICD-10-CM

## 2018-10-02 MED ORDER — MUPIROCIN 2 % EX OINT: 1.0000 "application " | TOPICAL_OINTMENT | Freq: Two times a day (BID) | CUTANEOUS | 2 refills | Status: DC

## 2018-10-02 MED ORDER — CEPHALEXIN 500 MG PO CAPS: 500.0000 mg | ORAL_CAPSULE | Freq: Three times a day (TID) | ORAL | 0 refills | Status: DC

## 2018-10-02 NOTE — Progress Notes (Signed)
Subjective: This 78 year old male presents the office with concerns of an ulcer to the right second toe.  He states about 3 weeks ago he dropped a picture frame on his toe.  He states that he has had and pain to the area.  He works for [employer] and he states that about 5 hours after being on speech instructed the discomfort.  He has noticed no swelling or redness.  Is also asking for his nails be trimmed today's are thickened elongated and he cannot take himself.  On Coumadin. Denies any systemic complaints such as fevers, chills, nausea, vomiting. No acute changes since last appointment, and no other complaints at this time.   Objective: AAO x3, NAD DP/PT pulses palpable bilaterally, CRT less than 3 seconds Hammertoe contractures are present.  Is a hyperkeratotic lesion of the distal aspect of right second toe and upon debridement there was a superficial wound present.  Small amount of clear drainage but no pus.  Localized edema and erythema to the distal toe.  No ascending cellulitis.  No fluctuation or crepitation.   Nails are hypertrophic, dystrophic, brittle, discolored, elongated 10. No surrounding redness or drainage. Tenderness nails 1-5 bilaterally. No open lesions or pre-ulcerative lesions are identified today.No open lesions or pre-ulcerative lesions.  No pain with calf compression, swelling, warmth, erythema  Assessment: Ulceration right second toe with localized infection; plan mycosis on blood thinners  Plan: -All treatment options discussed with the patient including all alternatives, risks, complications.  -X-rays were obtained and reviewed.  Mild radiolucency present consistent with possible fracture although no evidence of osteomyelitis or soft tissue edema. -I debrided the hyperkeratotic tissue, wound to the right second toe utilizing a 312 with scalpel down to healthy tissue.  Prescribe antibiotic ointment as well as Keflex.  Offloading pads dispensed.  Discussed wearing  stiffer soled shoe to avoid bending of the toes.  Monitoring signs or symptoms of worsening infection go the ER should any occur. -Nails debrided x10 without any complications or bleeding  Return in about 1 week (around 10/09/2018).  Trula Slade DPM

## 2018-10-03 ENCOUNTER — Other Ambulatory Visit: Payer: Self-pay | Admitting: Podiatry

## 2018-10-03 DIAGNOSIS — L97514 Non-pressure chronic ulcer of other part of right foot with necrosis of bone: Secondary | ICD-10-CM

## 2018-10-03 NOTE — Telephone Encounter (Signed)
Called pt and he states that he wants to call us back once his wife gets home as she manages all of his medications.

## 2018-10-07 ENCOUNTER — Other Ambulatory Visit: Payer: Self-pay | Admitting: Family Medicine

## 2018-10-09 ENCOUNTER — Ambulatory Visit: Payer: Medicare Other | Admitting: Podiatry

## 2018-10-10 NOTE — Telephone Encounter (Signed)
Called pt for 3rd time and LM regarding need for Tizanidine refill and fact that Dr. Paulla Fore is no longer working at [employer].  Asked pt to call back to determine if he does or does not need this refill.  Will send letter to pt's home address if no response by 10/13/18.

## 2018-10-14 ENCOUNTER — Encounter: Payer: Medicare Other | Admitting: Family Medicine

## 2018-10-16 ENCOUNTER — Encounter: Payer: Self-pay | Admitting: Physical Therapy

## 2018-10-16 NOTE — Telephone Encounter (Signed)
Letter sent via MyChart and also to pt's home address on record.

## 2018-10-23 ENCOUNTER — Encounter: Payer: Self-pay | Admitting: Podiatry

## 2018-10-23 ENCOUNTER — Ambulatory Visit (INDEPENDENT_AMBULATORY_CARE_PROVIDER_SITE_OTHER): Payer: Medicare Other | Admitting: Podiatry

## 2018-10-23 ENCOUNTER — Ambulatory Visit (INDEPENDENT_AMBULATORY_CARE_PROVIDER_SITE_OTHER): Payer: Medicare Other

## 2018-10-23 ENCOUNTER — Other Ambulatory Visit: Payer: Self-pay

## 2018-10-23 VITALS — Temp 98.9°F

## 2018-10-23 DIAGNOSIS — L97511 Non-pressure chronic ulcer of other part of right foot limited to breakdown of skin: Secondary | ICD-10-CM

## 2018-10-23 DIAGNOSIS — S90851A Superficial foreign body, right foot, initial encounter: Secondary | ICD-10-CM

## 2018-10-29 NOTE — Progress Notes (Signed)
Subjective: 78 year old male presents the office today for evaluation of a wound to the right second toe.  States the area is doing well and the area is healed.  Denies any drainage or pus coming from the area denies any swelling or redness.  He also thinks he stepped on a piece of glass recently on the right foot.  He has seen another doctor for this several months ago however he was told they were unable to identify any foreign object and had x-rays performed at that time as well.  Denies any pain, swelling or redness but he still notices a small dark spot present. Denies any systemic complaints such as fevers, chills, nausea, vomiting. No acute changes since last appointment, and no other complaints at this time.   Objective: AAO x3, NAD DP/PT pulses palpable bilaterally, CRT less than 3 seconds Hyperkeratotic lesion the distal aspect the right second toe.  On debridement there is no ongoing ulceration drainage or signs of infection the wound appears to be healed.  Small punctate hyperkeratotic lesion right foot submetatarsal 1 with a small dark spot present.  Upon debridement is able to remove a small piece of dark formed arterial catheter is no other obvious identified.  There is no ulceration, drainage or pus present infection. No open lesions or pre-ulcerative lesions.  No pain with calf compression, swelling, warmth, erythema  Assessment: Right foot second toe healed ulceration and foreign body  Plan: -All treatment options discussed with the patient including all alternatives, risks, complications.  -Debrided hyperkeratotic tissue on the right second toe with any complications.  The wound appears to be healed.  Continue offloading. -In regards to this foreign body x-rays were obtained and reviewed.  Not able to identify any evidence of an object.  Debrided the area and superficial there was a small dark piece of foreign material removed and infection.  Antibiotic ointment dressing changes  daily. -Monitor for any clinical signs or symptoms of infection and directed to call the office immediately should any occur or go to the ER. -Patient encouraged to call the office with any questions, concerns, change in symptoms.   Return in about 3 weeks (around 11/13/2018).  Trula Slade DPM

## 2018-11-03 ENCOUNTER — Other Ambulatory Visit: Payer: Self-pay | Admitting: Podiatry

## 2018-11-03 ENCOUNTER — Other Ambulatory Visit: Payer: Self-pay

## 2018-11-03 ENCOUNTER — Ambulatory Visit (INDEPENDENT_AMBULATORY_CARE_PROVIDER_SITE_OTHER): Payer: Medicare Other

## 2018-11-03 ENCOUNTER — Ambulatory Visit (INDEPENDENT_AMBULATORY_CARE_PROVIDER_SITE_OTHER): Payer: Medicare Other | Admitting: Podiatry

## 2018-11-03 VITALS — Temp 98.1°F

## 2018-11-03 DIAGNOSIS — L97511 Non-pressure chronic ulcer of other part of right foot limited to breakdown of skin: Secondary | ICD-10-CM

## 2018-11-03 DIAGNOSIS — M7751 Other enthesopathy of right foot: Secondary | ICD-10-CM

## 2018-11-03 DIAGNOSIS — M2041 Other hammer toe(s) (acquired), right foot: Secondary | ICD-10-CM

## 2018-11-03 DIAGNOSIS — M778 Other enthesopathies, not elsewhere classified: Secondary | ICD-10-CM

## 2018-11-03 DIAGNOSIS — L03031 Cellulitis of right toe: Secondary | ICD-10-CM

## 2018-11-03 DIAGNOSIS — M79671 Pain in right foot: Secondary | ICD-10-CM

## 2018-11-03 MED ORDER — CEPHALEXIN 500 MG PO CAPS: 500.0000 mg | ORAL_CAPSULE | Freq: Three times a day (TID) | ORAL | 0 refills | Status: DC

## 2018-11-03 NOTE — Progress Notes (Signed)
Subjective: 78 year old male presents the office today as a walk-in appointment for concerns of his right second toe becoming swelling no structures on his feet all day at work and while walking.  He states the pain did increase as well but no pus feeling better today mild swelling.  There is no drainage coming from the wound the tip of the toe.  Otherwise he is been doing well. Denies any systemic complaints such as fevers, chills, nausea, vomiting. No acute changes since last appointment, and no other complaints at this time.   Objective: AAO x3, NAD DP/PT pulses palpable bilaterally, CRT less than 3 seconds Hammertoe contractures present which is flexible on the second toe.  There is localized edema and erythema to the second toe but no ascending cellulitis.  No increase in warmth of toe.  Hyperkeratotic lesion the distal aspect and upon debridement of the wound there is no probing, undermining or tunneling there is no signs of an abscess.  Superficial granular wound is present which is small and almost an abrasion type lesion.  There is a foreign body submetatarsal 1 is healed without pain.  No signs of infection. No open lesions or pre-ulcerative lesions.  No pain with calf compression, swelling, warmth, erythema  Assessment: Right second toe cellulitis, ulceration  Plan: -All treatment options discussed with the patient including all alternatives, risks, complications.  -X-rays were obtained and reviewed.  Hammertoe contracture present.  No obvious signs of osteomyelitis or soft tissue edema. -Debrided wound to the distal aspect of right second toe without any complications or bleeding.  Recommended antibiotic ointment dressing changes daily. -Keflex -Offloading -Discussed flexor tenotomy once the infection resolves to help take pressure off the distal toe. -Monitor for any clinical signs or symptoms of infection and directed to call the office immediately should any occur or go to the  ER. -Patient encouraged to call the office with any questions, concerns, change in symptoms.   Return in about 2 weeks (around 11/17/2018).  Trula Slade DPM

## 2018-11-04 ENCOUNTER — Ambulatory Visit: Payer: Medicare Other | Admitting: Podiatry

## 2018-11-14 ENCOUNTER — Telehealth: Payer: Self-pay | Admitting: Family Medicine

## 2018-11-14 ENCOUNTER — Other Ambulatory Visit: Payer: Self-pay | Admitting: Family Medicine

## 2018-11-14 NOTE — Telephone Encounter (Signed)
Patient requesting call back from [place] today.  Would like to be worked in this evening.

## 2018-11-17 ENCOUNTER — Other Ambulatory Visit: Payer: Self-pay

## 2018-11-17 ENCOUNTER — Encounter: Payer: Self-pay | Admitting: Podiatry

## 2018-11-17 ENCOUNTER — Ambulatory Visit (INDEPENDENT_AMBULATORY_CARE_PROVIDER_SITE_OTHER): Payer: Medicare Other | Admitting: Podiatry

## 2018-11-17 DIAGNOSIS — M2041 Other hammer toe(s) (acquired), right foot: Secondary | ICD-10-CM | POA: Diagnosis not present

## 2018-11-17 DIAGNOSIS — L97511 Non-pressure chronic ulcer of other part of right foot limited to breakdown of skin: Secondary | ICD-10-CM

## 2018-11-18 ENCOUNTER — Ambulatory Visit (INDEPENDENT_AMBULATORY_CARE_PROVIDER_SITE_OTHER): Payer: Medicare Other | Admitting: General Practice

## 2018-11-18 DIAGNOSIS — Z7901 Long term (current) use of anticoagulants: Secondary | ICD-10-CM | POA: Diagnosis not present

## 2018-11-18 LAB — POCT INR: INR: 2.6 (ref 2.0–3.0)

## 2018-11-18 NOTE — Progress Notes (Signed)
Medical screening examination/treatment/procedure(s) were performed by non-physician practitioner and as supervising physician I was immediately available for consultation/collaboration. I agree with above. Suleika Donavan, MD   

## 2018-11-18 NOTE — Patient Instructions (Addendum)
Pre visit review using our clinic review tool, if applicable. No additional management support is needed unless otherwise documented below in the visit note.  Continue take 1 tablet daily except 1/2 tablet on Mondays and Fridays.  Re-check in 6 weeks.  

## 2018-11-25 ENCOUNTER — Other Ambulatory Visit: Payer: Self-pay | Admitting: Family Medicine

## 2018-11-25 MED ORDER — GABAPENTIN 300 MG PO CAPS: ORAL_CAPSULE | ORAL | 1 refills | Status: DC

## 2018-11-25 NOTE — Telephone Encounter (Signed)
Pt called in and stated that he was getting gabapentin from Dr Paulla Fore and his is currently out of this med and would like to know if Dr Yong Channel would refill it?  He is using this med for sleep.    Pharmacy - [facility]

## 2018-11-25 NOTE — Telephone Encounter (Signed)
Last OV 09/25/18 Last refill 04/16/18 by Dr. Paulla Fore Next OV not scheduled  Forwarding to Dr. Yong Channel

## 2018-11-28 NOTE — Progress Notes (Signed)
Subjective: 78 year old male presents the office today for follow-up evaluation of right second toe pain.  He states that although the redness any infection appears to be controlled he still has pain at the tip of the toe at times.  This is particularly after being on his feet all day.  No recent injury. Denies any systemic complaints such as fevers, chills, nausea, vomiting. No acute changes since last appointment, and no other complaints at this time.   Objective: AAO x3, NAD DP/PT pulses palpable bilaterally, CRT less than 3 seconds Hammertoe contractures present which is flexible on the second toe.  Callus present the distal aspect the toe without any underlying ulceration.  Mild erythema to the distal aspect the toe but this appears to be more from inflammation of pressure as opposed to infection.  There is no increase in warmth.  There is no drainage or pus.  No fluctuation crepitation. No open lesions or pre-ulcerative lesions.  No pain with calf compression, swelling, warmth, erythema  Assessment: Right second toe cellulitis, ulceration  Plan: -All treatment options discussed with the patient including all alternatives, risks, complications.  -Debrided the hyperkeratotic tissue today without any complications.  Continue difficulty pads I dispensed today.  Again discussed flexor tenotomy.  Discussed the risks of doing this including transfer lesions.  He is also started to get a small callus at the distal third toe.  Concerned that we do a flexor tenotomy and second toe extensor because of discomfort of the third digit.  I will see him back next appointment time if the toe is still hurting we will do the flexor tenotomy.  Return in about 2 weeks (around 12/01/2018).  Trula Slade DPM

## 2018-12-01 ENCOUNTER — Ambulatory Visit: Payer: Medicare Other | Admitting: Podiatry

## 2018-12-10 ENCOUNTER — Other Ambulatory Visit: Payer: Self-pay | Admitting: Family Medicine

## 2018-12-15 DIAGNOSIS — C9000 Multiple myeloma not having achieved remission: Secondary | ICD-10-CM | POA: Diagnosis not present

## 2018-12-15 DIAGNOSIS — R6889 Other general symptoms and signs: Secondary | ICD-10-CM | POA: Diagnosis not present

## 2018-12-16 ENCOUNTER — Other Ambulatory Visit: Payer: Self-pay

## 2018-12-16 ENCOUNTER — Ambulatory Visit (INDEPENDENT_AMBULATORY_CARE_PROVIDER_SITE_OTHER): Payer: Medicare Other | Admitting: General Practice

## 2018-12-16 DIAGNOSIS — Z7901 Long term (current) use of anticoagulants: Secondary | ICD-10-CM

## 2018-12-16 LAB — POCT INR: INR: 2.4 (ref 2.0–3.0)

## 2018-12-16 NOTE — Progress Notes (Signed)
Medical screening examination/treatment/procedure(s) were performed by non-physician practitioner and as supervising physician I was immediately available for consultation/collaboration. I agree with above. James John, MD   

## 2018-12-16 NOTE — Patient Instructions (Signed)
Pre visit review using our clinic review tool, if applicable. No additional management support is needed unless otherwise documented below in the visit note.  Continue take 1 tablet daily except 1/2 tablet on Mondays and Fridays.  Re-check in 6 weeks.  

## 2019-01-13 ENCOUNTER — Other Ambulatory Visit: Payer: Self-pay | Admitting: Family Medicine

## 2019-02-03 ENCOUNTER — Ambulatory Visit: Payer: Medicare Other

## 2019-02-06 ENCOUNTER — Telehealth: Payer: Self-pay | Admitting: Family Medicine

## 2019-02-06 NOTE — Telephone Encounter (Signed)
I left a message asking the patient and spouse to call and schedule Medicare AWV with Courtney ([facility]).  If patient calls back, please schedule Medicare Wellness Visit (initial) at next available opening.  VDM (Dee-Dee) °

## 2019-02-06 NOTE — Telephone Encounter (Signed)
Ok to give letter

## 2019-02-06 NOTE — Telephone Encounter (Signed)
See note

## 2019-02-06 NOTE — Telephone Encounter (Signed)
Copied from [place] [phone]. Topic: General - Other >> Feb 06, 2019  9:30 AM Rainey Pines A wrote: Patient requests callback from Dr. Ronney Lion nurse in regards to getting documentation from Dr. Ronney Lion stated that patients is high risk and and his wife that works at the hospital will be excused from working with covid patients. Please advise

## 2019-02-06 NOTE — Telephone Encounter (Signed)
Patient calling back about this request. She is requesting call back.

## 2019-02-07 NOTE — Telephone Encounter (Signed)
To whom it may concern,  Patient would be high risk for complications of [id number] if he contracted the disease.  His wife works in the hospital and if she works with COVID-19 positive patients that likely increases his risk of contracting the disease-if possible please allow patient's wife to not work with COVID-19 positive patients.  Thanks, Garret Reddish

## 2019-02-09 NOTE — Telephone Encounter (Signed)
Called and lm for pt tcb regarding if the letter needs to be faxed or if they will pick it up.

## 2019-02-10 ENCOUNTER — Ambulatory Visit (INDEPENDENT_AMBULATORY_CARE_PROVIDER_SITE_OTHER): Payer: Medicare Other | Admitting: Podiatry

## 2019-02-10 ENCOUNTER — Encounter: Payer: Self-pay | Admitting: Podiatry

## 2019-02-10 ENCOUNTER — Other Ambulatory Visit: Payer: Self-pay

## 2019-02-10 DIAGNOSIS — M79675 Pain in left toe(s): Secondary | ICD-10-CM

## 2019-02-10 DIAGNOSIS — Z7901 Long term (current) use of anticoagulants: Secondary | ICD-10-CM | POA: Diagnosis not present

## 2019-02-10 DIAGNOSIS — B351 Tinea unguium: Secondary | ICD-10-CM

## 2019-02-10 DIAGNOSIS — M79674 Pain in right toe(s): Secondary | ICD-10-CM | POA: Diagnosis not present

## 2019-02-10 DIAGNOSIS — L84 Corns and callosities: Secondary | ICD-10-CM | POA: Diagnosis not present

## 2019-02-13 ENCOUNTER — Other Ambulatory Visit: Payer: Self-pay

## 2019-02-13 ENCOUNTER — Ambulatory Visit (INDEPENDENT_AMBULATORY_CARE_PROVIDER_SITE_OTHER): Payer: Medicare Other | Admitting: General Practice

## 2019-02-13 DIAGNOSIS — Z7901 Long term (current) use of anticoagulants: Secondary | ICD-10-CM

## 2019-02-13 LAB — POCT INR: INR: 3.5 — AB (ref 2.0–3.0)

## 2019-02-13 NOTE — Progress Notes (Signed)
Medical screening examination/treatment/procedure(s) were performed by non-physician practitioner and as supervising physician I was immediately available for consultation/collaboration. I agree with above. Liona Wengert, MD   

## 2019-02-13 NOTE — Patient Instructions (Signed)
Pre visit review using our clinic review tool, if applicable. No additional management support is needed unless otherwise documented below in the visit note. 

## 2019-02-16 NOTE — Progress Notes (Signed)
Subjective: 78 y.o. returns the office today for painful, elongated, thickened toenails which he cannot trim himself.  As well as for skin lesion on the right second toe.  He states he has not had any drainage or swelling or any redness.  He has not has any openings.  Denies any redness or drainage around the nails. Denies any acute changes since last appointment and no new complaints today. Denies any systemic complaints such as fevers, chills, nausea, vomiting.   PCP: Marin Olp, MD Objective: AAO 3, NAD DP/PT pulses palpable, CRT less than 3 seconds Nails hypertrophic, dystrophic, elongated, brittle, discolored 10. There is tenderness overlying the nails 1-5 bilaterally. There is no surrounding erythema or drainage along the nail sites. Hyperkeratotic lesion right second toe distally.  No underlying ulceration drainage or any signs of infection. No open lesions or pre-ulcerative lesions are identified. No other areas of tenderness bilateral lower extremities. No overlying edema, erythema, increased warmth. No pain with calf compression, swelling, warmth, erythema.  Assessment: Patient presents with symptomatic onychomycosis; preulcerative callus  Plan: -Treatment options including alternatives, risks, complications were discussed -Nails sharply debrided 10 without complication/bleeding. -Hyperkeratotic lesion sharply debrided without any complications or bleeding.  Continue offloading. -Discussed daily foot inspection. If there are any changes, to call the office immediately.   Return in about 9 weeks (around 04/14/2019).  Celesta Gentile, DPM

## 2019-03-13 ENCOUNTER — Ambulatory Visit: Payer: Medicare Other

## 2019-03-16 ENCOUNTER — Telehealth: Payer: Self-pay | Admitting: *Deleted

## 2019-03-16 NOTE — Telephone Encounter (Signed)
Copied from [place] [phone]. Topic: General - Other >> Mar 16, 2019 11:05 AM Rayann Heman wrote: Reason for CRM: pt called and stated that he would like a call back from cindy to follow up with her. Please advise

## 2019-03-18 ENCOUNTER — Ambulatory Visit (INDEPENDENT_AMBULATORY_CARE_PROVIDER_SITE_OTHER): Payer: Medicare Other | Admitting: General Practice

## 2019-03-18 DIAGNOSIS — Z7901 Long term (current) use of anticoagulants: Secondary | ICD-10-CM

## 2019-03-18 NOTE — Patient Instructions (Incomplete)
Pre visit review using our clinic review tool, if applicable. No additional management support is needed unless otherwise documented below in the visit note. 

## 2019-04-01 ENCOUNTER — Ambulatory Visit (INDEPENDENT_AMBULATORY_CARE_PROVIDER_SITE_OTHER): Payer: Medicare Other | Admitting: General Practice

## 2019-04-01 ENCOUNTER — Other Ambulatory Visit: Payer: Self-pay

## 2019-04-01 DIAGNOSIS — Z7901 Long term (current) use of anticoagulants: Secondary | ICD-10-CM | POA: Diagnosis not present

## 2019-04-01 LAB — POCT INR: INR: 2.3 (ref 2.0–3.0)

## 2019-04-01 NOTE — Progress Notes (Signed)
I have reviewed and agree with note, evaluation, plan.   Docie Abramovich, MD  

## 2019-04-01 NOTE — Patient Instructions (Addendum)
Pre visit review using our clinic review tool, if applicable. No additional management support is needed unless otherwise documented below in the visit note. Continue take 1 tablet daily except 1/2 tablet on Mondays and Fridays.  Re-check in 4 weeks.

## 2019-04-06 ENCOUNTER — Ambulatory Visit (INDEPENDENT_AMBULATORY_CARE_PROVIDER_SITE_OTHER): Payer: Medicare Other | Admitting: Family Medicine

## 2019-04-06 ENCOUNTER — Encounter: Payer: Self-pay | Admitting: Family Medicine

## 2019-04-06 ENCOUNTER — Ambulatory Visit: Payer: Medicare Other | Admitting: Family Medicine

## 2019-04-06 ENCOUNTER — Other Ambulatory Visit: Payer: Self-pay

## 2019-04-06 VITALS — BP 122/70 | HR 77 | Temp 97.6°F | Ht 68.75 in | Wt 213.0 lb

## 2019-04-06 DIAGNOSIS — Z8546 Personal history of malignant neoplasm of prostate: Secondary | ICD-10-CM | POA: Diagnosis not present

## 2019-04-06 DIAGNOSIS — E785 Hyperlipidemia, unspecified: Secondary | ICD-10-CM

## 2019-04-06 DIAGNOSIS — E039 Hypothyroidism, unspecified: Secondary | ICD-10-CM

## 2019-04-06 DIAGNOSIS — I2782 Chronic pulmonary embolism: Secondary | ICD-10-CM | POA: Diagnosis not present

## 2019-04-06 DIAGNOSIS — F3342 Major depressive disorder, recurrent, in full remission: Secondary | ICD-10-CM

## 2019-04-06 DIAGNOSIS — K219 Gastro-esophageal reflux disease without esophagitis: Secondary | ICD-10-CM

## 2019-04-06 DIAGNOSIS — C9001 Multiple myeloma in remission: Secondary | ICD-10-CM

## 2019-04-06 DIAGNOSIS — Z9484 Stem cells transplant status: Secondary | ICD-10-CM

## 2019-04-06 DIAGNOSIS — E559 Vitamin D deficiency, unspecified: Secondary | ICD-10-CM

## 2019-04-06 NOTE — Progress Notes (Signed)
Phone [phone] In person visit   Subjective:   Rodney [name] is a 79 y.o. year old very pleasant male patient who presents for/with See problem oriented charting Chief Complaint  Patient presents with  . Follow-up   This visit occurred during the SARS-CoV-2 public health emergency.  Safety protocols were in place, including screening questions prior to the visit, additional usage of staff PPE, and extensive cleaning of exam room while observing appropriate contact time as indicated for disinfecting solutions.   Past Medical History-  Patient Active Problem List   Diagnosis Date Noted  . Multiple myeloma in remission ([place]) 07/10/2012    Priority: High  . PROSTATE CANCER, HX OF 01/05/2008    Priority: High  . Pulmonary embolism ([place]) 01/05/2008    Priority: High  . Hyperlipidemia, unspecified 04/06/2019    Priority: Medium  . Overactive bladder 04/09/2017    Priority: Medium  . History of skin cancer 04/09/2017    Priority: Medium  . Recurrent major depression in full remission ([place]) 10/02/2016    Priority: Medium  . Bilateral hip pain 04/06/2016    Priority: Medium  . Hypothyroidism 02/01/2014    Priority: Medium  . Back pain, lumbosacral 02/11/2012    Priority: Medium  . Esophageal reflux 05/31/2008    Priority: Medium  . Vitamin D deficiency 04/09/2017    Priority: Low  . Back pain, thoracic 12/19/2016    Priority: Low  . Long term (current) use of anticoagulants 12/14/2016    Priority: Low  . Lump of skin 04/23/2016    Priority: Low  . Carpal tunnel syndrome on both sides 01/24/2016    Priority: Low  . Right foot pain 10/05/2015    Priority: Low  . Tendonitis of ankle, left 10/03/2015    Priority: Low  . Allergic rhinitis 02/01/2014    Priority: Low  . Bone pain 02/18/2013    Priority: Low  . History of duodenal ulcer 01/05/2008    Priority: Low  . Stem cells transplant status ([place]) 09/25/2018  . Tear of left gluteus medius tendon 11/08/2017  .  Primary osteoarthritis of both hips 11/08/2017    Medications- reviewed and updated Current Outpatient Medications  Medication Sig Dispense Refill  . calcium carbonate (TUMS - DOSED IN MG ELEMENTAL CALCIUM) 500 MG chewable tablet Chew 1 tablet by mouth every 2 (two) hours as needed. HEART BURN      . Calcium Carbonate-Vitamin D (CALCIUM-D PO) Take by mouth.    . Cholecalciferol (VITAMIN D) 1000 UNITS capsule Take 4,000 Units by mouth daily.     . citalopram (CELEXA) 10 MG tablet Take 1 tablet (10 mg total) by mouth daily. 90 tablet 3  . Coenzyme Q10 (COQ10 PO) Take by mouth.    . Cyanocobalamin (VITAMIN B12 PO) Take by mouth.    . diazepam (VALIUM) 5 MG tablet TAKE 1 TABLET BY MOUTH EVERY 8 HOURS AS NEEDED FOR ANXIETY OR MUSCLE SPASMS 30 tablet 0  . EUTHYROX 100 MCG tablet Take 1 tablet by mouth once daily 90 tablet 0  . gabapentin (NEURONTIN) 300 MG capsule     . KLOR-CON M20 20 MEQ tablet   11  . mirabegron ER (MYRBETRIQ) 25 MG TB24 tablet Take 25 mg by mouth daily.    . Multiple Vitamins-Minerals (MULTIVITAMIN ADULT PO) Take by mouth.    . mupirocin ointment (BACTROBAN) 2 % Apply 1 application topically 2 (two) times daily. 30 g 2  . nitroGLYCERIN (NITRODUR - DOSED IN MG/24 HR) 0.2  mg/hr patch PLACE 1/4 TO 1/2 OF PATCH OVER AFFECTED REGION. REMOVE AND REPLACE ONCE DAILY. SLIGHTLY ALTER SKIN PLACEMENT DAILY 30 patch 0  . omeprazole (PRILOSEC) 20 MG capsule Take 1 capsule by mouth twice daily 180 capsule 0  . ondansetron (ZOFRAN) 4 MG tablet TAKE ONE TABLET BY MOUTH EVERY 8 HOURS AS NEEDED FOR NAUSEA OR VOMITING 20 tablet 0  . POTASSIUM PO Take 1 tablet by mouth daily.    . Probiotic Product (ALIGN PO) Take by mouth.    . REVLIMID 10 MG capsule Take 10 mg by mouth daily. 21 days on and 7 days off    . warfarin (COUMADIN) 5 MG tablet TAKE 1 TABLET BY MOUTH ONCE DAILY OR  AS  DIRECTED  BY  ANTICOAGULATION  CLINIC 30 tablet 5   No current facility-administered medications for this visit.      Objective:  BP 122/70   Pulse 77   Temp 97.6 F (36.4 C)   Ht 5' 8.75" (1.746 m)   Wt 213 lb (96.6 kg)   SpO2 94%   BMI 31.68 kg/m  Gen: NAD, resting comfortably CV: RRR no murmurs rubs or gallops Lungs: CTAB no crackles, wheeze, rhonchi Ext: no edema Skin: warm, dry    Assessment and Plan   #hypothyroidism S: compliant On thyroid medication- levothyroxine 100 mcg  Lab Results  Component Value Date   TSH 7.00 (H) 09/25/2018   A/P: slightly undertreated last visit- recheck in next week or two and adjust if needed   #hyperlipidemia S: patient opted to not take statin after last visit- had recommended atorvastatin '20mg'$  once a week. He is gaining weight and that is likely contributing to poor control. Up 13 lbs since coming off revlimid Lab Results  Component Value Date   CHOL 151 09/25/2018   HDL 36.10 (L) 09/25/2018   LDLCALC 44 02/17/2016   LDLDIRECT 76.0 09/25/2018   TRIG 308.0 (H) 09/25/2018   CHOLHDL 4 09/25/2018   A/P: poor control of cholesterol- recommended healthy eating/regular exercise/weight loss and consideration of cholesterol medicine if not improving by follow up physical in 6 months  #Multiple myeloma in remission-remains off treatment.  Continues follow-up with hematology.  Finished course of Revlimid for 5 years.  History of stem cell transplant in the past   #Toe ulceration-right second toe-referred to podiatry last visit- cleared with seein gthem- encouraged follow up with podiatry for any other issues  #Chronic PE-history of 2 pulmonary embolisms in the past so remains on chronic Coumadin.  INR checks at our clinic   #GERD S: History of ulcer years ago.  Had been on omeprazole twice a day last visit-we attempted to decrease to 20 mg last visit.  Patient reports  A/P: Doing well-continue lower dose '20mg'$  daily  # Depression S: Compliant with citalopram 10 mg.  Also has diazepam available as needed.  Omeprazole may increase the effect of  Celexa Depression screen [facility] 2/9 04/06/2019  Decreased Interest 0  Down, Depressed, Hopeless 0  PHQ - 2 Score 0  Altered sleeping 3  Tired, decreased energy 1  Change in appetite 0  Feeling bad or failure about yourself  0  Trouble concentrating 0  Moving slowly or fidgety/restless 0  Suicidal thoughts 0  PHQ-9 Score 4  Difficult doing work/chores Not difficult at all  Some recent data might be hidden  A/P: in remission- continue current medicine.    #Vitamin D deficiency-patient compliant with vitamin D OTC per wife. Will recheck next visit-  hes not sure dose he is taking   # hasnt signed up for covid 19 vaccine yet- encouraged him to consider  - needs to return for labs  Recommended follow up: 6 months for physical Future Appointments  Date Time Provider [place]  04/14/2019  9:45 AM Trula Slade, DPM [facility]  04/29/2019  4:00 PM [facility]   Lab/Order associations:   ICD-10-CM   1. Hyperlipidemia, unspecified hyperlipidemia type  E78.5 Comprehensive metabolic panel    CBC with Differential/Platelet  2. Other chronic pulmonary embolism without acute cor pulmonale (HCC)  I27.82   3. Multiple myeloma in remission (HCC)  C90.01   4. PROSTATE CANCER, HX OF  Z85.46 PSA  5. Recurrent major depression in full remission ([place])  F33.42   6. Hypothyroidism, unspecified type  E03.9 TSH  7. Stem cells transplant status ([place])  Z94.84   8. Gastroesophageal reflux disease, unspecified whether esophagitis present  K21.9   9. Vitamin D deficiency  E55.9    Return precautions advised.  Garret Reddish, MD

## 2019-04-06 NOTE — Patient Instructions (Addendum)
Health Maintenance Due  Topic Date Due  . INFLUENZA VACCINE - declined 10/04/2018   poor control of cholesterol- recommended healthy eating/regular exercise/weight loss and consideration of cholesterol medicine if not improving by follow up physical in 6 months  Schedule a lab visit at the check out desk within 2 weeks. Return for future fasting labs meaning nothing but water after midnight please. Ok to take your medications with water.

## 2019-04-14 ENCOUNTER — Other Ambulatory Visit: Payer: Self-pay

## 2019-04-14 ENCOUNTER — Ambulatory Visit (INDEPENDENT_AMBULATORY_CARE_PROVIDER_SITE_OTHER): Payer: Medicare Other | Admitting: Podiatry

## 2019-04-14 ENCOUNTER — Ambulatory Visit (INDEPENDENT_AMBULATORY_CARE_PROVIDER_SITE_OTHER): Payer: Medicare Other

## 2019-04-14 DIAGNOSIS — S9032XA Contusion of left foot, initial encounter: Secondary | ICD-10-CM

## 2019-04-14 DIAGNOSIS — M79674 Pain in right toe(s): Secondary | ICD-10-CM | POA: Diagnosis not present

## 2019-04-14 DIAGNOSIS — Z7901 Long term (current) use of anticoagulants: Secondary | ICD-10-CM | POA: Diagnosis not present

## 2019-04-14 DIAGNOSIS — M79672 Pain in left foot: Secondary | ICD-10-CM | POA: Diagnosis not present

## 2019-04-14 DIAGNOSIS — B351 Tinea unguium: Secondary | ICD-10-CM | POA: Diagnosis not present

## 2019-04-14 DIAGNOSIS — M7752 Other enthesopathy of left foot: Secondary | ICD-10-CM

## 2019-04-14 DIAGNOSIS — M7750 Other enthesopathy of unspecified foot: Secondary | ICD-10-CM

## 2019-04-14 DIAGNOSIS — L84 Corns and callosities: Secondary | ICD-10-CM | POA: Diagnosis not present

## 2019-04-14 DIAGNOSIS — M79675 Pain in left toe(s): Secondary | ICD-10-CM

## 2019-04-14 MED ORDER — CICLOPIROX 8 % EX SOLN: Freq: Every day | CUTANEOUS | 4 refills | Status: DC

## 2019-04-14 MED ORDER — DICLOFENAC SODIUM 1 % EX GEL: 2.0000 g | Freq: Four times a day (QID) | CUTANEOUS | 2 refills | Status: DC

## 2019-04-14 NOTE — Patient Instructions (Signed)
Ciclopirox nail solution What is this medicine? CICLOPIROX (sye kloe PEER ox) NAIL SOLUTION is an antifungal medicine. It used to treat fungal infections of the nails. This medicine may be used for other purposes; ask your health care provider or pharmacist if you have questions. COMMON BRAND NAME(S): CNL8, Penlac What should I tell my health care provider before I take this medicine? They need to know if you have any of these conditions:  diabetes mellitus  history of seizures  HIV infection  immune system problems or organ transplant  large areas of burned or damaged skin  peripheral vascular disease or poor circulation  taking corticosteroid medication (including steroid inhalers, cream, or lotion)  an unusual or allergic reaction to ciclopirox, isopropyl alcohol, other medicines, foods, dyes, or preservatives  pregnant or trying to get pregnant  breast-feeding How should I use this medicine? This medicine is for external use only. Follow the directions that come with this medicine exactly. Wash and dry your hands before use. Avoid contact with the eyes, mouth or nose. If you do get this medicine in your eyes, rinse out with plenty of cool tap water. Contact your doctor or health care professional if eye irritation occurs. Use at regular intervals. Do not use your medicine more often than directed. Finish the full course prescribed by your doctor or health care professional even if you think you are better. Do not stop using except on your doctor's advice. Talk to your pediatrician regarding the use of this medicine in children. While this medicine may be prescribed for children as young as 12 years for selected conditions, precautions do apply. Overdosage: If you think you have taken too much of this medicine contact a poison control center or emergency room at once. NOTE: This medicine is only for you. Do not share this medicine with others. What if I miss a dose? If you miss a  dose, use it as soon as you can. If it is almost time for your next dose, use only that dose. Do not use double or extra doses. What may interact with this medicine? Interactions are not expected. Do not use any other skin products without telling your doctor or health care professional. This list may not describe all possible interactions. Give your health care provider a list of all the medicines, herbs, non-prescription drugs, or dietary supplements you use. Also tell them if you smoke, drink alcohol, or use illegal drugs. Some items may interact with your medicine. What should I watch for while using this medicine? Tell your doctor or health care professional if your symptoms get worse. Four to six months of treatment may be needed for the nail(s) to improve. Some people may not achieve a complete cure or clearing of the nails by this time. Tell your doctor or health care professional if you develop sores or blisters that do not heal properly. If your nail infection returns after stopping using this product, contact your doctor or health care professional. What side effects may I notice from receiving this medicine? Side effects that you should report to your doctor or health care professional as soon as possible:  allergic reactions like skin rash, itching or hives, swelling of the face, lips, or tongue  severe irritation, redness, burning, blistering, peeling, swelling, oozing Side effects that usually do not require medical attention (report to your doctor or health care professional if they continue or are bothersome):  mild reddening of the skin  nail discoloration  temporary burning or mild   stinging at the site of application This list may not describe all possible side effects. Call your doctor for medical advice about side effects. You may report side effects to FDA at 1-800-FDA-1088. Where should I keep my medicine? Keep out of the reach of children. Store at room temperature  between 15 and 30 degrees C (59 and 86 degrees F). Do not freeze. Protect from light by storing the bottle in the carton after every use. This medicine is flammable. Keep away from heat and flame. Throw away any unused medicine after the expiration date. NOTE: This sheet is a summary. It may not cover all possible information. If you have questions about this medicine, talk to your doctor, pharmacist, or health care provider.  2020 Elsevier/Gold Standard (2007-05-26 16:49:20)  

## 2019-04-15 ENCOUNTER — Other Ambulatory Visit: Payer: Self-pay | Admitting: Podiatry

## 2019-04-15 DIAGNOSIS — M7750 Other enthesopathy of unspecified foot: Secondary | ICD-10-CM

## 2019-04-20 NOTE — Progress Notes (Signed)
Subjective: 79 y.o. returns the office today for painful, elongated, thickened toenails which he cannot trim himself.  He also states that he hit his left foot on a shopping cart about 3 weeks ago he had some discomfort.  He thought it get better but still having discomfort.  No significant swelling or redness.  Still able to wear regular shoe and work.  Also asked for treatment options for nail fungus. Denies any acute changes since last appointment and no new complaints today. Denies any systemic complaints such as fevers, chills, nausea, vomiting.   PCP: Marin Olp, MD Objective: AAO 3, NAD DP/PT pulses palpable, CRT less than 3 seconds Nails hypertrophic, dystrophic, elongated, brittle, discolored 10. There is tenderness overlying the nails 1-5 bilaterally. There is no surrounding erythema or drainage along the nail sites. Hyperkeratotic lesion right second toe distally.  No underlying ulceration drainage or any signs of infection. Mild discomfort to the medial aspect the left foot mostly the first metatarsal cuneiform joint area.  Flexor, extensor tendons appear to be intact.  There is no significant edema there is no erythema or warmth. No open lesions or pre-ulcerative lesions are identified. No other areas of tenderness bilateral lower extremities. No overlying edema, erythema, increased warmth. No pain with calf compression, swelling, warmth, erythema.  Assessment: Patient presents with symptomatic onychomycosis; preulcerative callus; contusion left foot  Plan: -Treatment options including alternatives, risks, complications were discussed -X-rays obtained and reviewed.  There is no evidence of acute fracture identified. -Nails sharply debrided 10 without complication/bleeding.  Prescribed Penlac. -Hyperkeratotic lesion sharply debrided without any complications or bleeding.  Continue offloading. -Prescribed Voltaren gel. -Discussed daily foot inspection. If there are any  changes, to call the office immediately.   Return in about 2 months (around 06/15/2019).   Celesta Gentile, DPM

## 2019-04-21 ENCOUNTER — Telehealth: Payer: Self-pay | Admitting: *Deleted

## 2019-04-21 NOTE — Telephone Encounter (Signed)
Called the patient to let him know that patient can get the voltaren 1% gel over the counter and the patient stated that he would go and get it from the pharmacy department. Lattie Haw

## 2019-04-28 ENCOUNTER — Other Ambulatory Visit: Payer: Self-pay

## 2019-04-29 ENCOUNTER — Ambulatory Visit (INDEPENDENT_AMBULATORY_CARE_PROVIDER_SITE_OTHER): Payer: Medicare Other | Admitting: General Practice

## 2019-04-29 DIAGNOSIS — Z7901 Long term (current) use of anticoagulants: Secondary | ICD-10-CM | POA: Diagnosis not present

## 2019-04-29 LAB — POCT INR: INR: 2.4 (ref 2.0–3.0)

## 2019-04-29 NOTE — Progress Notes (Signed)
I have reviewed the results and agree with this plan   

## 2019-04-29 NOTE — Patient Instructions (Addendum)
Pre visit review using our clinic review tool, if applicable. No additional management support is needed unless otherwise documented below in the visit note.  Continue take 1 tablet daily except 1/2 tablet on Mondays and Fridays.  Re-check in 6 weeks.  

## 2019-05-01 ENCOUNTER — Other Ambulatory Visit: Payer: Self-pay

## 2019-05-01 ENCOUNTER — Encounter: Payer: Self-pay | Admitting: Podiatry

## 2019-05-01 ENCOUNTER — Ambulatory Visit (INDEPENDENT_AMBULATORY_CARE_PROVIDER_SITE_OTHER): Payer: Medicare Other

## 2019-05-01 ENCOUNTER — Telehealth: Payer: Self-pay | Admitting: *Deleted

## 2019-05-01 ENCOUNTER — Ambulatory Visit (INDEPENDENT_AMBULATORY_CARE_PROVIDER_SITE_OTHER): Payer: Medicare Other | Admitting: Podiatry

## 2019-05-01 VITALS — Temp 97.6°F

## 2019-05-01 DIAGNOSIS — S9032XD Contusion of left foot, subsequent encounter: Secondary | ICD-10-CM | POA: Diagnosis not present

## 2019-05-01 DIAGNOSIS — M79672 Pain in left foot: Secondary | ICD-10-CM | POA: Diagnosis not present

## 2019-05-01 DIAGNOSIS — S96912A Strain of unspecified muscle and tendon at ankle and foot level, left foot, initial encounter: Secondary | ICD-10-CM

## 2019-05-01 NOTE — Progress Notes (Signed)
Subjective: 79 year old male presents the office today for an acute appointment giving worsening swelling and pain to his left foot.  He states that yesterday morning he turned his foot and heard a pop and since then he has had swelling and pain to the anterior aspect of the ankle into the foot.  He did go to work after this but was having discomfort.  He worked his full shifts.  I saw him a few weeks ago after a contusion to his left foot.  A shopping cart hit his foot and he was having some discomfort.  At that time he had no swelling.  He was doing well until yesterday when he turned his foot a certain direction he heard a pop.  Denies any systemic complaints such as fevers, chills, nausea, vomiting. No acute changes since last appointment, and no other complaints at this time.   Objective: AAO x3, NAD DP/PT pulses palpable bilaterally, CRT less than 3 seconds There is tenderness on the anterior aspect of the ankle going into the medial aspect of foot.  This appears to involve the course of the tibialis anterior tendon, extensor tendons.  Is difficult to palpate the boundaries of the tendon given the moderate swelling.  There is no erythema or warmth.  MMT 4/5 on the left mostly in dorsiflexion.  He is able to move his ankle up and down.  Mild discomfort inversion eversion.  No area of pinpoint tenderness.   No open lesions or pre-ulcerative lesions.  No pain with calf compression, swelling, warmth, erythema  Assessment: Concern for tendon rupture left ankle  Plan: -All treatment options discussed with the patient including all alternatives, risks, complications.  -X-rays obtained reviewed of the foot.  There is no evidence of acute fracture I can see my read.  Will await results from the radiologist. -Immobilization in cam boot.  This was dispensed today. -Ordered MRI of the left ankle to evaluate tendon rupture. -Ice and elevation -Patient encouraged to call the office with any questions,  concerns, change in symptoms.   Follow-up after MRI or sooner if needed  Trula Slade DPM

## 2019-05-01 NOTE — Telephone Encounter (Signed)
-----   Message from Trula Slade, DPM sent at 05/01/2019 11:18 AM EST ----- Lattie Haw or Val-  I put in an order for left ankle MRI to rule out tendon rupture. I ordered it for the [facility] if someone can follow up with them. Thanks.

## 2019-05-01 NOTE — Telephone Encounter (Signed)
Orders to L. Cox, CMA for pre-cert, faxed to [employer] [facility].

## 2019-05-03 ENCOUNTER — Other Ambulatory Visit: Payer: Self-pay

## 2019-05-03 ENCOUNTER — Ambulatory Visit (INDEPENDENT_AMBULATORY_CARE_PROVIDER_SITE_OTHER): Payer: Medicare Other

## 2019-05-03 DIAGNOSIS — R600 Localized edema: Secondary | ICD-10-CM | POA: Diagnosis not present

## 2019-05-03 DIAGNOSIS — S96912A Strain of unspecified muscle and tendon at ankle and foot level, left foot, initial encounter: Secondary | ICD-10-CM

## 2019-05-03 DIAGNOSIS — M7662 Achilles tendinitis, left leg: Secondary | ICD-10-CM | POA: Diagnosis not present

## 2019-05-03 DIAGNOSIS — M19072 Primary osteoarthritis, left ankle and foot: Secondary | ICD-10-CM | POA: Diagnosis not present

## 2019-05-03 DIAGNOSIS — M7732 Calcaneal spur, left foot: Secondary | ICD-10-CM | POA: Diagnosis not present

## 2019-05-04 ENCOUNTER — Other Ambulatory Visit: Payer: Self-pay | Admitting: Family Medicine

## 2019-05-04 ENCOUNTER — Telehealth: Payer: Self-pay | Admitting: *Deleted

## 2019-05-04 NOTE — Telephone Encounter (Signed)
Called and spoke with Bubba Hales from [facility] and representative stated that procedure code [id number] does not require a prior authorization and the reference code is [id number] and spoke with Hoyle Sauer from [facility] and Hoyle Sauer is going to schedule the patient for Saturday March 6th. Lattie Haw

## 2019-05-04 NOTE — Telephone Encounter (Signed)
Patient had the MRI done at [facility] yesterday per Hoyle Sauer at [facility]. Rodney [name]

## 2019-05-05 ENCOUNTER — Telehealth: Payer: Self-pay | Admitting: Urology

## 2019-05-05 NOTE — Telephone Encounter (Signed)
PT would like a call with the results from their scan.Thanks.

## 2019-05-05 NOTE — Telephone Encounter (Signed)
Attempted to call patient back. Left VM with my cell and our office number to call back.

## 2019-05-06 ENCOUNTER — Telehealth: Payer: Self-pay | Admitting: *Deleted

## 2019-05-06 NOTE — Telephone Encounter (Signed)
I spoke with pt and told him Dr. Jacqualyn Posey was trying to get in touch with him to discuss his MRI results. Pt states he spoke with Dr. Jacqualyn Posey and will call for an appt once he gets off work.

## 2019-05-06 NOTE — Telephone Encounter (Signed)
Pt states he had MRI 05/03/2019 and is calling for results.

## 2019-05-12 ENCOUNTER — Ambulatory Visit (INDEPENDENT_AMBULATORY_CARE_PROVIDER_SITE_OTHER): Payer: Medicare Other | Admitting: Podiatry

## 2019-05-12 ENCOUNTER — Other Ambulatory Visit: Payer: Self-pay

## 2019-05-12 DIAGNOSIS — S96912A Strain of unspecified muscle and tendon at ankle and foot level, left foot, initial encounter: Secondary | ICD-10-CM | POA: Diagnosis not present

## 2019-05-12 NOTE — Progress Notes (Signed)
Subjective: 79 year old male presents the office today for follow-up evaluation after sustaining tibialis anterior tendon rupture.  He states he has been doing well.  He only wear the cam boot for about 1 day and since that has been back to wearing regular shoe.  He has an occasional discomfort but no significant pain.  Swelling is resolved. Denies any systemic complaints such as fevers, chills, nausea, vomiting. No acute changes since last appointment, and no other complaints at this time.   Objective: AAO x3, NAD DP/PT pulses palpable bilaterally, CRT less than 3 seconds There is mild tenderness palpation the course of the tibialis anterior tendon although minimal.  There is a small deficit present on the tibialis anterior consistent with a rupture.  Swelling has resolved.  Mild discomfort with dorsiflexion.  Otherwise muscle strength appears to be intact.  No pain the peroneal tendons, Achilles tendon. No open lesions or pre-ulcerative lesions.  No pain with calf compression, swelling, warmth, erythema  MRI: IMPRESSION: 1. Severe tendinosis of the tibialis anterior with a complete tear. 2. Mild tendinosis of the peroneus brevis with a longitudinal split tear. 3. Mild tendinosis of the Achilles tendon. Severe soft tissue edema in Kager's fat. 4. Thickening of the medial band of the plantar fascia likely reflecting old healed plantar fasciitis versus plantar fibrosis.   Assessment: 79 year old male left tibialis anterior tendon rupture  Plan: -All treatment options discussed with the patient including all alternatives, risks, complications.  -I reviewed the MRI with him again today.  We discussed both conservative as well as surgical treatment options.  He wants to hold off any surgical intervention.  Also he is back to wearing regular shoe.  I do want to continue mobilization however.  Dispensed a Tri-Lock ankle brace.  Encouraged ice elevation and try to limit his activity to allow this  to heal.  If he has any long-term weakness will likely need to have surgical invention but he wants to hold off. -Patient encouraged to call the office with any questions, concerns, change in symptoms.   Return in about 3 weeks (around 06/02/2019).  Trula Slade DPM

## 2019-05-13 ENCOUNTER — Telehealth: Payer: Self-pay | Admitting: Podiatry

## 2019-05-13 NOTE — Telephone Encounter (Signed)
Called and spoke to pt he stated he will call us to make his appt he did not have his schedule.  05/13/19

## 2019-05-13 NOTE — Telephone Encounter (Signed)
-----   Message from Sheppard Evens sent at 05/13/2019  8:52 AM EST ----- Spoke to patient, he will have to call back when he has his work schedule.  Thanks, amanda ----- Message ----- From: Trula Slade, DPM Sent: 05/10/2019   8:58 AM EST To: Sheppard Evens, Cathleen Corti, Macarthur Critchley  I don't see a follow up scheduled for him. Can someone call him for a follow up? He has a torn tendon in the ankle and I called him last week to go over the results. I had sent a message previously about getting scheduled and just wanted to follow up as I didn't see one. Thanks so much!!

## 2019-05-18 DIAGNOSIS — C9000 Multiple myeloma not having achieved remission: Secondary | ICD-10-CM | POA: Diagnosis not present

## 2019-05-18 DIAGNOSIS — R6889 Other general symptoms and signs: Secondary | ICD-10-CM | POA: Diagnosis not present

## 2019-05-18 DIAGNOSIS — D472 Monoclonal gammopathy: Secondary | ICD-10-CM | POA: Diagnosis not present

## 2019-05-18 LAB — CBC: RBC: 4.73 (ref 3.87–5.11)

## 2019-05-18 LAB — CBC AND DIFFERENTIAL
HCT: 45 (ref 41–53)
Hemoglobin: 15 (ref 13.5–17.5)
Neutrophils Absolute: 4
Platelets: 201 (ref 150–399)
WBC: 5.8

## 2019-05-18 LAB — COMPREHENSIVE METABOLIC PANEL WITH GFR
Calcium: 8.9 (ref 8.7–10.7)
GFR calc Af Amer: 59
GFR calc non Af Amer: 53

## 2019-05-18 LAB — BASIC METABOLIC PANEL WITH GFR
BUN: 22 — AB (ref 4–21)
CO2: 26 — AB (ref 13–22)
Chloride: 107 (ref 99–108)
Creatinine: 1.3 (ref 0.6–1.3)
Glucose: 86
Potassium: 4.9 (ref 3.4–5.3)
Sodium: 138 (ref 137–147)

## 2019-05-18 LAB — HEPATIC FUNCTION PANEL
ALT: 25 (ref 10–40)
AST: 23 (ref 14–40)
Alkaline Phosphatase: 96 (ref 25–125)
Bilirubin, Total: 0.4

## 2019-05-19 ENCOUNTER — Telehealth: Payer: Self-pay | Admitting: Podiatry

## 2019-05-19 NOTE — Telephone Encounter (Signed)
I called patient and left a voicemail to call back.

## 2019-05-19 NOTE — Telephone Encounter (Signed)
Patient calling with questions regarding surgery and recovery time. Patient ask that Dr. Jacqualyn Posey call him back directly.

## 2019-05-20 ENCOUNTER — Telehealth: Payer: Self-pay | Admitting: Podiatry

## 2019-05-20 NOTE — Telephone Encounter (Signed)
Pt wanted to know if he has surgry how long would pt be out of work. please advise

## 2019-05-22 ENCOUNTER — Telehealth: Payer: Self-pay | Admitting: Podiatry

## 2019-05-22 NOTE — Telephone Encounter (Signed)
Attempted to call patient again. Left VM to call back.

## 2019-05-22 NOTE — Telephone Encounter (Signed)
I have called the patient back. Left another VM to call back.

## 2019-05-22 NOTE — Telephone Encounter (Signed)
Pt called wanted to know about how long pt would be out of work from surgry  please advise

## 2019-05-26 NOTE — Telephone Encounter (Signed)
I was finally able to reach the patient. He is doing well with minimal discomfort but he is dragging his foot some. We discussed surgical versus conservative treatment. We discussed recovery of the surgery. He is doing to consider his options. Crystal- can you please schedule him a follow up to discuss and also to check the nails. I have seen him in both [place] and [place]. Thanks.

## 2019-05-27 ENCOUNTER — Telehealth: Payer: Self-pay | Admitting: Podiatry

## 2019-05-27 NOTE — Telephone Encounter (Signed)
Called pt on 05/27/19 as requested =by Dr Jacqualyn Posey to sched pt lvm foe pt to call and sched appt.

## 2019-06-01 ENCOUNTER — Other Ambulatory Visit: Payer: Self-pay | Admitting: Family Medicine

## 2019-06-08 ENCOUNTER — Telehealth: Payer: Self-pay | Admitting: Family Medicine

## 2019-06-08 NOTE — Telephone Encounter (Signed)
I left a message asking the patient to call and schedule Medicare AWV with Courtney ([facility]).  If patient calls back, please schedule Medicare Wellness Visit at next available opening.  VDM (Dee-Dee) °

## 2019-06-10 ENCOUNTER — Ambulatory Visit: Payer: Medicare Other

## 2019-06-17 ENCOUNTER — Other Ambulatory Visit: Payer: Self-pay | Admitting: Family Medicine

## 2019-07-07 ENCOUNTER — Other Ambulatory Visit: Payer: Self-pay

## 2019-07-07 ENCOUNTER — Ambulatory Visit (INDEPENDENT_AMBULATORY_CARE_PROVIDER_SITE_OTHER): Payer: Medicare Other | Admitting: Podiatry

## 2019-07-07 DIAGNOSIS — Z7901 Long term (current) use of anticoagulants: Secondary | ICD-10-CM

## 2019-07-07 DIAGNOSIS — M79672 Pain in left foot: Secondary | ICD-10-CM

## 2019-07-07 DIAGNOSIS — S96912A Strain of unspecified muscle and tendon at ankle and foot level, left foot, initial encounter: Secondary | ICD-10-CM | POA: Diagnosis not present

## 2019-07-07 DIAGNOSIS — B351 Tinea unguium: Secondary | ICD-10-CM

## 2019-07-08 ENCOUNTER — Encounter: Payer: Self-pay | Admitting: Podiatry

## 2019-07-09 ENCOUNTER — Other Ambulatory Visit: Payer: Self-pay

## 2019-07-09 ENCOUNTER — Ambulatory Visit (INDEPENDENT_AMBULATORY_CARE_PROVIDER_SITE_OTHER): Payer: Medicare Other | Admitting: General Practice

## 2019-07-09 DIAGNOSIS — Z7901 Long term (current) use of anticoagulants: Secondary | ICD-10-CM | POA: Diagnosis not present

## 2019-07-09 LAB — POCT INR: INR: 2.2 (ref 2.0–3.0)

## 2019-07-09 NOTE — Progress Notes (Signed)
Medical screening examination/treatment/procedure(s) were performed by non-physician practitioner and as supervising physician I was immediately available for consultation/collaboration. I agree with above. Paula Busenbark, MD   

## 2019-07-09 NOTE — Patient Instructions (Addendum)
Pre visit review using our clinic review tool, if applicable. No additional management support is needed unless otherwise documented below in the visit note.  Continue take 1 tablet daily except 1/2 tablet on Mondays and Fridays.  Re-check in 6 weeks.  

## 2019-07-13 NOTE — Progress Notes (Signed)
Subjective: 79 year old male presents the office today for follow-up evaluation after sustaining tibialis anterior tendon rupture.  He has been wearing a brace which she states has been helpful.  Continue to work.  He wants to hold off any surgical intervention.  Swelling improved.  Also his nails be trimmed today's are thickened elongated causing discomfort mostly with shoes.  Denies any redness or triggering swelling. Denies any systemic complaints such as fevers, chills, nausea, vomiting. No acute changes since last appointment, and no other complaints at this time.   Objective: AAO x3, NAD DP/PT pulses palpable bilaterally, CRT less than 3 seconds There is minimal tenderness palpation the course of the tibialis anterior tendon although minimal.  There is a small deficit present on the tibialis anterior consistent with a rupture still evident.  Minimal swelling. Nails are hypertrophic, dystrophic, brittle, discolored, elongated 10. No surrounding redness or drainage. Tenderness nails 1-5 bilaterally. No open lesions or pre-ulcerative lesions are identified today. No open lesions or pre-ulcerative lesions.  No pain with calf compression, swelling, warmth, erythema  MRI: IMPRESSION: 1. Severe tendinosis of the tibialis anterior with a complete tear. 2. Mild tendinosis of the peroneus brevis with a longitudinal split tear. 3. Mild tendinosis of the Achilles tendon. Severe soft tissue edema in Kager's fat. 4. Thickening of the medial band of the plantar fascia likely reflecting old healed plantar fasciitis versus plantar fibrosis.   Assessment: 79 year old male left tibialis anterior tendon rupture; onychomycosis  Plan: -All treatment options discussed with the patient including all alternatives, risks, complications.  -Debrided nails x2 without any complications or bleeding.  Continue Penlac. -Again discussed treatment options for the tibialis anterior tendon rupture.  Discussed the  conservative as well as surgical options.  He wants off any surgery.  At this point there is an ongoing the last couple of months and would not start physical therapy.  Continue brace for now.  Return in about 2 months (around 09/06/2019).  Trula Slade DPM

## 2019-07-24 ENCOUNTER — Other Ambulatory Visit: Payer: Self-pay | Admitting: Family Medicine

## 2019-08-08 ENCOUNTER — Other Ambulatory Visit: Payer: Self-pay | Admitting: Family Medicine

## 2019-08-20 ENCOUNTER — Ambulatory Visit: Payer: Medicare Other

## 2019-08-20 DIAGNOSIS — R6889 Other general symptoms and signs: Secondary | ICD-10-CM | POA: Diagnosis not present

## 2019-08-20 DIAGNOSIS — C9000 Multiple myeloma not having achieved remission: Secondary | ICD-10-CM | POA: Diagnosis not present

## 2019-08-20 DIAGNOSIS — R946 Abnormal results of thyroid function studies: Secondary | ICD-10-CM | POA: Diagnosis not present

## 2019-08-25 ENCOUNTER — Ambulatory Visit: Payer: Medicare Other

## 2019-08-28 ENCOUNTER — Telehealth: Payer: Self-pay | Admitting: Family Medicine

## 2019-08-28 NOTE — Telephone Encounter (Signed)
New message:   Pt is calling and states he would like a return call. Pt states he was trying to make and appt today. I have notified the pt that she is not at the [facility] location today. Pt states he would still like a call. Please advise.

## 2019-08-29 ENCOUNTER — Other Ambulatory Visit: Payer: Self-pay | Admitting: Family Medicine

## 2019-08-31 NOTE — Telephone Encounter (Signed)
This is the 2nd attempt to reach patient.  LMOVM to call Villa Herb, RN @ [phone] @ the [place] office today.

## 2019-09-02 ENCOUNTER — Ambulatory Visit (INDEPENDENT_AMBULATORY_CARE_PROVIDER_SITE_OTHER): Payer: Medicare Other | Admitting: General Practice

## 2019-09-02 ENCOUNTER — Other Ambulatory Visit: Payer: Self-pay

## 2019-09-02 DIAGNOSIS — Z7901 Long term (current) use of anticoagulants: Secondary | ICD-10-CM

## 2019-09-02 LAB — POCT INR: INR: 2 (ref 2.0–3.0)

## 2019-09-02 NOTE — Patient Instructions (Signed)
Pre visit review using our clinic review tool, if applicable. No additional management support is needed unless otherwise documented below in the visit note.  Take 1 1/2 tablets today and then continue take 1 tablet daily except 1/2 tablet on Mondays and Fridays.  Re-check in 6 weeks.

## 2019-09-03 ENCOUNTER — Encounter: Payer: Self-pay | Admitting: Family Medicine

## 2019-09-03 ENCOUNTER — Ambulatory Visit (INDEPENDENT_AMBULATORY_CARE_PROVIDER_SITE_OTHER): Payer: Medicare Other | Admitting: Family Medicine

## 2019-09-03 ENCOUNTER — Telehealth: Payer: Self-pay | Admitting: Family Medicine

## 2019-09-03 VITALS — BP 120/64 | HR 63 | Temp 98.7°F | Ht 68.75 in | Wt 209.0 lb

## 2019-09-03 DIAGNOSIS — S20469A Insect bite (nonvenomous) of unspecified back wall of thorax, initial encounter: Secondary | ICD-10-CM

## 2019-09-03 MED ORDER — MUPIROCIN 2 % EX OINT: 1.0000 "application " | TOPICAL_OINTMENT | Freq: Two times a day (BID) | CUTANEOUS | 0 refills | Status: DC

## 2019-09-03 NOTE — Progress Notes (Signed)
Patient: Rodney [name] MRN: [id number] DOB: October 19, 1940 PCP: Marin Olp, MD     Subjective:  Chief Complaint  Patient presents with  . Insect Bite    x 3 days     HPI: The patient is a 79 y.o. male who presents today for insect bite that he noticed about 3 days ago. He states he was outside working and mowing with his zero turn mower. He did have to go into the woods and the ground was rough. He doesn't recall getting bitten during this time. No stinging or itching. He went and took a shower and went to bed. When he got up his wife asked him what stung him. He had no idea. He looked in the mirror he could see the area and it was red and raised. His wife put ointment (A&D) on it. He doesn't recall scratching it. He states now it has a big scab on it. He states he has mild pain. No drainage, fever/chills. He is UTD on his tetanus. No tick that he is aware of.   Review of Systems  Constitutional: Negative for chills and fever.  Respiratory: Negative for cough and shortness of breath.   Cardiovascular: Negative for chest pain and palpitations.  Skin: Positive for wound.    Allergies Patient is allergic to blood-group specific substance.  Past Medical History Patient  has a past medical history of Anemia (02-16-11), Arthritis, Bone pain (02/18/2013), Cellulitis, Clotting disorder ([place]), Complication of anesthesia (02-16-11), Degenerative disc disease (02-16-11), Esophageal reflux (05/31/2008), Hernia (02-16-11), Hypothyroidism (02/01/2014), Multiple myeloma (02-16-11), Pancreatitis, Personal history of traumatic fracture (07/10/2012), Prostate cancer ([place]), Pulmonary embolism ([place]), Second degree atrioventricular block (11/26/2012), and Shortness of breath (02-16-11).  Surgical History Patient  has a past surgical history that includes Cholecystectomy (04/06/2010); Inguinal hernia repair (02/20/2011); Prostate surgery (Bilateral, 2011); Lumbar disc surgery (02-16-11 ); Tonsillectomy and  adenoidectomy; Appendectomy; and Cataract extraction.  Family History Pateint's family history includes Clotting disorder in his mother; Deep vein thrombosis in his mother; Diabetes in his daughter, daughter, and son; Lung disease in his brother; Lymphoma (age of onset: 40) in his father; Prostate cancer in his father.  Social History Patient  reports that he quit smoking about 50 years ago. He has never used smokeless tobacco. He reports that he does not drink alcohol and does not use drugs.    Objective: Vitals:   09/03/19 1307  BP: 120/64  Pulse: 63  Temp: 98.7 F (37.1 C)  TempSrc: Temporal  SpO2: 96%  Weight: 209 lb (94.8 kg)  Height: 5' 8.75" (1.746 m)    Body mass index is 31.09 kg/m.  Physical Exam Vitals reviewed.  Constitutional:      Appearance: Normal appearance.  Skin:    Findings: Lesion (right mid back. just under a 1cm in diameter. scabbed over with no drainage. no edema or surrounding erythema. ) present.  Neurological:     Mental Status: He is alert.        Assessment/plan: 1. Insect bite of back, unspecified laterality, initial encounter No signs of infection at this time. Will treat with topical bactroban TID. utd on tetanus. Discussed signs of infection and if any change/spreding redness, drainage, etc. They are to let me know.    This visit occurred during the SARS-CoV-2 public health emergency.  Safety protocols were in place, including screening questions prior to the visit, additional usage of staff PPE, and extensive cleaning of exam room while observing appropriate contact time as indicated for disinfecting  solutions.     Return if symptoms worsen or fail to improve.     Orma Flaming, MD [place]  09/03/2019

## 2019-09-03 NOTE — Telephone Encounter (Signed)
Patient is scheduled for tomorrow   Nurse Assessment Nurse: Tawanna Solo, RN, Vaughan Basta Date/Time Eilene Ghazi Time): 09/02/2019 8:35:34 PM Confirm and document reason for call. If symptomatic, describe symptoms. ---Caller says he was bitten by something on his back on Tuesday. It started out as a round red spot. It is now blood red and painful. Has the patient had close contact with a person known or suspected to have the novel coronavirus illness OR traveled / lives in area with major community spread (including international travel) in the last 14 days from the onset of symptoms? * If Asymptomatic, screen for exposure and travel within the last 14 days. ---No Does the patient have any new or worsening symptoms? ---Yes Will a triage be completed? ---Yes Related visit to physician within the last 2 weeks? ---No Does the PT have any chronic conditions? (i.e. diabetes, asthma, this includes High risk factors for pregnancy, etc.) ---No Is this a behavioral health or substance abuse call? ---No Guidelines Guideline Title Affirmed Question Affirmed Notes Nurse Date/Time (Eastern Time) Insect Bite [1] Red or very tender (to touch) area AND [2] started over 24 hours after the bite Tawanna Solo, RNVaughan Basta 09/02/2019 8:37:35 PMPLEASE NOTE: All timestamps contained within this report are represented as [place] Standard Time. CONFIDENTIALTY NOTICE: This fax transmission is intended only for the addressee. It contains information that is legally privileged, confidential or otherwise protected from use or disclosure. If you are not the intended recipient, you are strictly prohibited from reviewing, disclosing, copying using or disseminating any of this information or taking any action in reliance on or regarding this information. If you have received this fax in error, please notify us immediately by telephone so that we can arrange for its return to [place]. Phone: [phone], Toll-Free: [phone],  Fax: [phone] Page: 2 of 2 Call Id: [id number] [place]. Time Eilene Ghazi Time) Disposition Final User 09/02/2019 8:40:23 PM See PCP within 24 Hours Yes Tawanna Solo, RN, Phineas Semen Disagree/Comply Comply Caller Understands Yes PreDisposition InappropriateToAsk Care Advice Given Per Guideline SEE PCP WITHIN 24 HOURS: * IF OFFICE WILL BE OPEN: You need to be examined within the next 24 hours. Call your doctor (or NP/PA) when the office opens and make an appointment. ANTIBIOTIC OINTMENT: * Apply an antibiotic ointment (OTC). * Do this 4 times per day. PAIN MEDICINES: * For pain relief, you can take either acetaminophen, ibuprofen, or naproxen. * CAUTION: Do not take acetaminophen if you have liver disease. * CAUTION: Do not take ibuprofen or naproxen if you have stomach problems, kidney disease, are pregnant, or have been told by your doctor to avoid this type of anti-inflammatory drug. Do not take ibuprofen or naproxen for more than 7 days without consulting your doctor. CALL BACK IF: * Fever occurs * You become worse. CARE ADVICE given per Insect Bite (Adult) guideline. PAIN MEDICINES - EXTRA NOTES AND WARNINGS: Referrals REFERRED TO PCP OFFIC

## 2019-09-03 NOTE — Telephone Encounter (Signed)
Pt seeing Dr. Rogers Blocker at 1:00 PM today.

## 2019-09-03 NOTE — Patient Instructions (Addendum)
Does not look infected to me. Put the bactroban ointment on this twice to three times a day.   -if the skin starts to turn red and spreading, drainage, etc. We need to know and then will need to start antibiotic.   So nice to meet you!  Dr. Rogers Blocker

## 2019-09-04 ENCOUNTER — Ambulatory Visit: Payer: Medicare Other | Admitting: Family Medicine

## 2019-09-15 ENCOUNTER — Ambulatory Visit (INDEPENDENT_AMBULATORY_CARE_PROVIDER_SITE_OTHER): Payer: Medicare Other | Admitting: Podiatry

## 2019-09-15 ENCOUNTER — Other Ambulatory Visit: Payer: Self-pay

## 2019-09-15 DIAGNOSIS — M79675 Pain in left toe(s): Secondary | ICD-10-CM

## 2019-09-15 DIAGNOSIS — Z7901 Long term (current) use of anticoagulants: Secondary | ICD-10-CM | POA: Diagnosis not present

## 2019-09-15 DIAGNOSIS — S96912D Strain of unspecified muscle and tendon at ankle and foot level, left foot, subsequent encounter: Secondary | ICD-10-CM | POA: Diagnosis not present

## 2019-09-15 DIAGNOSIS — B351 Tinea unguium: Secondary | ICD-10-CM

## 2019-09-15 DIAGNOSIS — M79674 Pain in right toe(s): Secondary | ICD-10-CM | POA: Diagnosis not present

## 2019-09-18 NOTE — Progress Notes (Signed)
Subjective: 79 year old male presents the office today for follow-up evaluation after sustaining tibialis anterior tendon rupture as well as for thick, elongated toenails that he cannot trim himself.  He says the ankle is doing better has been doing home therapy.  He states that he feels the strength is getting better but still has some weakness and he is actually fallen once.  He is applying for a new job to [employer] as opposed to working in [employer]. Denies any systemic complaints such as fevers, chills, nausea, vomiting. No acute changes since last appointment, and no other complaints at this time.   Objective: AAO x3, NAD DP/PT pulses palpable bilaterally, CRT less than 3 seconds There is no tenderness palpation the course of the tibialis anterior tendon.  Overall strength appears to be improved.  Upon weightbearing, gait evaluation he still "slaps" his foot some but is much improved.  There is minimal edema.  There is no erythema or warmth. Nails are hypertrophic, dystrophic, brittle, discolored, elongated 10. No surrounding redness or drainage. Tenderness nails 1-5 bilaterally. No open lesions or pre-ulcerative lesions are identified today. No open lesions or pre-ulcerative lesions.  No pain with calf compression, swelling, warmth, erythema  MRI: IMPRESSION: 1. Severe tendinosis of the tibialis anterior with a complete tear. 2. Mild tendinosis of the peroneus brevis with a longitudinal split tear. 3. Mild tendinosis of the Achilles tendon. Severe soft tissue edema in Kager's fat. 4. Thickening of the medial band of the plantar fascia likely reflecting old healed plantar fasciitis versus plantar fibrosis.   Assessment: 79 year old male left tibialis anterior tendon rupture; symptomatic onychomycosis  Plan: -All treatment options discussed with the patient including all alternatives, risks, complications.  -Debrided nails x10 without any complications or bleeding.  Continue  Penlac. -Regards to the tibialis anterior tendon rupture we discussed with conservative as well as surgical options.  He declined surgical intervention.  I want him to at least wear the ankle brace when he is working for stability.  Discussed formal physical therapy but he wants to continue this at home.  Return in about 2 months (around 11/15/2019).  Trula Slade DPM

## 2019-09-28 ENCOUNTER — Other Ambulatory Visit: Payer: Self-pay | Admitting: Family Medicine

## 2019-09-29 NOTE — Progress Notes (Deleted)
Phone [phone] In person visit   Subjective:   Rodney [name] is a 79 y.o. year old very pleasant male patient who presents for/with See problem oriented charting No chief complaint on file.   This visit occurred during the SARS-CoV-2 public health emergency.  Safety protocols were in place, including screening questions prior to the visit, additional usage of staff PPE, and extensive cleaning of exam room while observing appropriate contact time as indicated for disinfecting solutions.   Past Medical History-  Patient Active Problem List   Diagnosis Date Noted  . Hyperlipidemia, unspecified 04/06/2019  . Stem cells transplant status ([place]) 09/25/2018  . Tear of left gluteus medius tendon 11/08/2017  . Primary osteoarthritis of both hips 11/08/2017  . Vitamin D deficiency 04/09/2017  . Overactive bladder 04/09/2017  . History of skin cancer 04/09/2017  . Back pain, thoracic 12/19/2016  . Long term (current) use of anticoagulants 12/14/2016  . Recurrent major depression in full remission ([place]) 10/02/2016  . Lump of skin 04/23/2016  . Bilateral hip pain 04/06/2016  . Carpal tunnel syndrome on both sides 01/24/2016  . Right foot pain 10/05/2015  . Tendonitis of ankle, left 10/03/2015  . Hypothyroidism 02/01/2014  . Allergic rhinitis 02/01/2014  . Bone pain 02/18/2013  . Multiple myeloma in remission ([place]) 07/10/2012  . Back pain, lumbosacral 02/11/2012  . Esophageal reflux 05/31/2008  . History of duodenal ulcer 01/05/2008  . PROSTATE CANCER, HX OF 01/05/2008  . Pulmonary embolism ([place]) 01/05/2008    Medications- reviewed and updated Current Outpatient Medications  Medication Sig Dispense Refill  . calcium carbonate (TUMS - DOSED IN MG ELEMENTAL CALCIUM) 500 MG chewable tablet Chew 1 tablet by mouth every 2 (two) hours as needed. HEART BURN      . Calcium Carbonate-Vitamin D (CALCIUM-D PO) Take by mouth.    . Cholecalciferol (VITAMIN D) 1000 UNITS capsule Take  4,000 Units by mouth daily.     . ciclopirox (PENLAC) 8 % solution Apply topically at bedtime. Apply over nail and surrounding skin. Apply daily over previous coat. After seven (7) days, may remove with alcohol and continue cycle. 6.6 mL 4  . citalopram (CELEXA) 10 MG tablet Take 1 tablet (10 mg total) by mouth daily. 90 tablet 3  . Coenzyme Q10 (COQ10 PO) Take by mouth.    . Cyanocobalamin (VITAMIN B12 PO) Take by mouth.    . diazepam (VALIUM) 5 MG tablet TAKE 1 TABLET BY MOUTH EVERY 8 HOURS AS NEEDED FOR ANXIETY OR MUSCLE SPASMS 30 tablet 0  . diclofenac Sodium (VOLTAREN) 1 % GEL Apply 2 g topically 4 (four) times daily. Rub into affected area of foot 2 to 4 times daily 100 g 2  . gabapentin (NEURONTIN) 300 MG capsule TAKE 3 CAPSULES BY MOUTH AT BEDTIME FOR  NERVE  PAIN 270 capsule 0  . KLOR-CON M20 20 MEQ tablet   11  . levothyroxine (SYNTHROID) 100 MCG tablet Take 1 tablet by mouth once daily 90 tablet 3  . mirabegron ER (MYRBETRIQ) 25 MG TB24 tablet Take 25 mg by mouth daily.    . Multiple Vitamins-Minerals (MULTIVITAMIN ADULT PO) Take by mouth.    . mupirocin ointment (BACTROBAN) 2 % Apply 1 application topically 2 (two) times daily. 30 g 0  . nitroGLYCERIN (NITRODUR - DOSED IN MG/24 HR) 0.2 mg/hr patch PLACE 1/4 TO 1/2 OF PATCH OVER AFFECTED REGION. REMOVE AND REPLACE ONCE DAILY. SLIGHTLY ALTER SKIN PLACEMENT DAILY 30 patch 0  . omeprazole (PRILOSEC) 20 MG  capsule Take 1 capsule by mouth twice daily 180 capsule 0  . ondansetron (ZOFRAN) 4 MG tablet TAKE ONE TABLET BY MOUTH EVERY 8 HOURS AS NEEDED FOR NAUSEA OR VOMITING 20 tablet 0  . Potassium Chloride ER 20 MEQ TBCR Take 1 tablet by mouth daily.    Marland Kitchen POTASSIUM PO Take 1 tablet by mouth daily.    Marland Kitchen PREVNAR 13 SUSP injection     . Probiotic Product (ALIGN PO) Take by mouth.    . REVLIMID 10 MG capsule Take 10 mg by mouth daily. 21 days on and 7 days off    . warfarin (COUMADIN) 5 MG tablet TAKE 1 TABLET BY MOUTH ONCE DAILY OR  AS  DIRECTED   BY  ANTICOAGULATION  CLINIC 30 tablet 0   No current facility-administered medications for this visit.     Objective:  There were no vitals taken for this visit. Gen: NAD, resting comfortably CV: RRR no murmurs rubs or gallops Lungs: CTAB no crackles, wheeze, rhonchi Abdomen: soft/nontender/nondistended/normal bowel sounds. No rebound or guarding.  Ext: no edema Skin: warm, dry Neuro: grossly normal, moves all extremities  ***    Assessment and Plan   #Vitamin D deficiency S: Medication: *** Last vitamin D Lab Results  Component Value Date   VD25OH 61.12 09/25/2018   A/P: ***   #hyperlipidemia S: Medication:***  Lab Results  Component Value Date   CHOL 151 09/25/2018   HDL 36.10 (L) 09/25/2018   LDLCALC 44 02/17/2016   LDLDIRECT 76.0 09/25/2018   TRIG 308.0 (H) 09/25/2018   CHOLHDL 4 09/25/2018   A/P: ***  #hypothyroidism S: compliant On thyroid medication-***  Lab Results  Component Value Date   TSH 7.00 (H) 09/25/2018    ROS-No hair or nail changes. No heat/cold intolerance. No constipation or diarrhea. Denies shakiness or anxiety.  A/P:***    No specialty comments available.  No problem-specific Assessment & Plan notes found for this encounter.   Recommended follow up: ***No follow-ups on file. Future Appointments  Date Time Provider [place]  10/06/2019  4:00 PM Marin Olp, MD [facility]  10/14/2019  3:30 PM [facility]  11/17/2019  2:45 PM Trula Slade, DPM [facility]    Lab/Order associations: No diagnosis found.  No orders of the defined types were placed in this encounter.   Time Spent: *** minutes of total time (4:50 PM***- 4:50 PM***) was spent on the date of the encounter performing the following actions: chart review prior to seeing the patient, obtaining history, performing a medically necessary exam, counseling on the treatment plan, placing orders, and documenting in our EHR.    Return precautions advised.  Clyde Lundborg, CMA

## 2019-10-06 ENCOUNTER — Encounter: Payer: Medicare Other | Admitting: Family Medicine

## 2019-10-06 DIAGNOSIS — Z0289 Encounter for other administrative examinations: Secondary | ICD-10-CM

## 2019-10-13 ENCOUNTER — Other Ambulatory Visit: Payer: Self-pay | Admitting: Family Medicine

## 2019-10-14 ENCOUNTER — Ambulatory Visit: Payer: Medicare Other | Admitting: General Practice

## 2019-10-16 ENCOUNTER — Other Ambulatory Visit: Payer: Self-pay | Admitting: Family Medicine

## 2019-10-21 ENCOUNTER — Ambulatory Visit: Payer: Medicare Other

## 2019-10-21 ENCOUNTER — Other Ambulatory Visit: Payer: Self-pay

## 2019-10-21 DIAGNOSIS — C61 Malignant neoplasm of prostate: Secondary | ICD-10-CM

## 2019-10-29 ENCOUNTER — Other Ambulatory Visit: Payer: Medicare Other

## 2019-10-29 ENCOUNTER — Other Ambulatory Visit: Payer: Self-pay

## 2019-10-29 DIAGNOSIS — C61 Malignant neoplasm of prostate: Secondary | ICD-10-CM

## 2019-10-30 LAB — PSA: Prostate Specific Ag, Serum: <0.1 ng/mL (ref 0.0–4.0)

## 2019-11-06 ENCOUNTER — Ambulatory Visit (INDEPENDENT_AMBULATORY_CARE_PROVIDER_SITE_OTHER): Payer: Medicare Other | Admitting: Urology

## 2019-11-06 ENCOUNTER — Encounter: Payer: Self-pay | Admitting: Urology

## 2019-11-06 ENCOUNTER — Other Ambulatory Visit: Payer: Self-pay

## 2019-11-06 VITALS — BP 123/77 | HR 57 | Temp 97.6°F | Ht 68.75 in | Wt 209.0 lb

## 2019-11-06 DIAGNOSIS — N5201 Erectile dysfunction due to arterial insufficiency: Secondary | ICD-10-CM | POA: Insufficient documentation

## 2019-11-06 DIAGNOSIS — N3281 Overactive bladder: Secondary | ICD-10-CM

## 2019-11-06 DIAGNOSIS — C61 Malignant neoplasm of prostate: Secondary | ICD-10-CM

## 2019-11-06 LAB — MICROSCOPIC EXAMINATION
Bacteria, UA: NONE SEEN
Epithelial Cells (non renal): NONE SEEN /hpf (ref 0–10)
Renal Epithel, UA: NONE SEEN /hpf
WBC, UA: NONE SEEN /hpf (ref 0–5)

## 2019-11-06 LAB — URINALYSIS, ROUTINE W REFLEX MICROSCOPIC
Bilirubin, UA: NEGATIVE
Glucose, UA: NEGATIVE
Ketones, UA: NEGATIVE
Leukocytes,UA: NEGATIVE
Nitrite, UA: NEGATIVE
Protein,UA: NEGATIVE
RBC, UA: NEGATIVE
Specific Gravity, UA: 1.02 (ref 1.005–1.030)
Urobilinogen, Ur: 0.2 mg/dL (ref 0.2–1.0)
pH, UA: 5 (ref 5.0–7.5)

## 2019-11-06 MED ORDER — MIRABEGRON ER 25 MG PO TB24: 25.0000 mg | ORAL_TABLET | Freq: Every day | ORAL | 11 refills | Status: DC

## 2019-11-06 MED ORDER — TADALAFIL 20 MG PO TABS
20.0000 mg | ORAL_TABLET | Freq: Every day | ORAL | 5 refills | Status: DC | PRN
Start: 2019-11-06 — End: 2020-09-26

## 2019-11-06 NOTE — Patient Instructions (Signed)

## 2019-11-06 NOTE — Progress Notes (Signed)
Urological Symptom Review  Patient is experiencing the following symptoms: Frequent urination Hard to postpone urination Get up at night to urinate Leakage of urine Erection problems (male only)   Review of Systems  Gastrointestinal (upper)  : Negative for upper GI symptoms  Gastrointestinal (lower) : Negative for lower GI symptoms  Constitutional : Negative for symptoms  Skin: Negative for skin symptoms  Eyes: Negative for eye symptoms  Ear/Nose/Throat : Sinus problems  Hematologic/Lymphatic: Negative for Hematologic/Lymphatic symptoms  Cardiovascular : Negative for cardiovascular symptoms  Respiratory : Negative for respiratory symptoms  Endocrine: Negative for endocrine symptoms  Musculoskeletal: Back pain Joint pain  Neurological: Negative for neurological symptoms  Psychologic: Negative for psychiatric symptoms

## 2019-11-06 NOTE — Progress Notes (Signed)
11/06/2019 1:33 PM   Rodney [name] 1940-11-13 [id number]  Referring provider: Marin Olp, MD [place],  [place] [address]  Followup prostate cancer and OAB  HPI: Rodney [name] is a 79yo here for followup for prostate cancer, OAB, and ED. PSA is undetectable. He is currently on mirabegron $RemoveBefor'50mg'SezywWIaybOj$  with good results. He denies any worsening urgency or incontinent episodes. He has ED and was previously given rx for tadalafil but did not fill rx.  Rodney [name] records from [facility] are as follows: I have urinary urgency.  HPI: Rodney [name] is a 79 year-old male established patient who is here for urinary urgency.  He does have urgency. He does have problems getting to the bathroom in time after he has the urge to urinate. The condition started approximately 07/04/2014. Rodney [name] symptoms have gotten worse over the last year.   He does not wear protective pads. He generally urinates every 4 hours in the daytime. He gets up at night to urinate 1 time. He is not having problems with emptying Rodney [name] bladder well.   08/28/2016: He is currently on mirabegron $RemoveBefor'25mg'gGOItxkOKwhi$  daily. Rodney [name] nocturia has improved for 1x.   02/22/2017: He is still on mirabegron $RemoveBefor'25mg'TQcoJHdbeaHN$  with good results   05/09/2018: urinary urgency is stable but he is bothered by the urgency. He believes the mirabegron is not working well.     CC: I have an overactive bladder.  HPI: He is taking Myrbetriq for Rodney [name] over active bladder. He does have urgency. He does not have problems getting to the bathroom in time after he has the urge to urinate. He does not urinate more frequently than once every 4 hours in the daytime.   He is satisfied with the way he is voiding. He does not wear protective pads. He gets up at night to urinate 1 time. He is not having problems with emptying Rodney [name] bladder well.   08/28/2016: He is doing well on mirabegron $RemoveBefor'25mg'qALQCjqOyDjo$    02/22/2017: He has stable urgency on nocturia 2x on mirbegron $RemoveBefo'25mg'wrzOeQRrVqj$    05/09/2018: He has urinary  urgency and rare urge incontinence. He has nocturia 2x. He is currently on mirabegron but he feels the medication is not working as well since he ran out for 2 months and restarted the medication     CC: I have prostate cancer.  HPI: Rodney [name] prostate cancer was diagnosed approximately 07/03/2004. He does not have the pathology report from Rodney [name] biopsy. Rodney [name] cancer was diagnosed by Dr. Janice Norrie.   He has undergone surgery for treatment. He has not undergone External Beam Radiation Therapy for treatment. He has not undergone Hormonal Therapy for treatment.   He does have urinary incontinence. He does have problems with erectile dysfunction. He has not recently had unwanted weight loss. He is not having pain in new locations.   07/17/2016: He has a hx of prostate cancer and had prostatectomy in 2008. PSa from 02/2016 was undetectable   02/22/2017: PSa was drawn but patient does not have the results   05/09/2018: PSa was drawn at Dr. Gwynn Burly office but he does not recall the number     CC: I am having trouble with my erections.  HPI: He first stated noticing pain on approximately 05/03/2008. Rodney [name] symptoms did begin gradually. Rodney [name] symptoms have been stable over the last year.   He does have difficulties achieving an erection. He does have problems maintaining Rodney [name] erections. Rodney [name] erections are straight. He has tried Viagra. It did not work.   He  does not have premature ejaculation. He does not have trouble reaching climax. He does not have anxiety because of the symptoms.       PMH: Past Medical History:  Diagnosis Date  . Anemia 02-16-11   01-05-11- post surgery-Transfusions x 4 units  . Arthritis   . Bone pain 02/18/2013  . Cellulitis   . Clotting disorder ([place])   . Complication of anesthesia 02-16-11   Pt. speaks of awakening during the surgery from back  surgery  . Degenerative disc disease 02-16-11   01-05-11 L3 fusion done  . Esophageal reflux 05/31/2008   Qualifier: Diagnosis of  By: Arnoldo Morale MD,  Balinda Quails   . Hernia 02-16-11   left inguinal hernia at present  . Hypothyroidism 02/01/2014  . Multiple myeloma 02-16-11   suspected tumor  left sacral  . Pancreatitis   . Personal history of traumatic fracture 07/10/2012  . Prostate cancer ([place])   . Pulmonary embolism ([place])   . Second degree atrioventricular block 11/26/2012  . Shortness of breath 02-16-11   hx. Pulmonary emboli x2 (9 yrs/ 3'12 -last)    Surgical History: Past Surgical History:  Procedure Laterality Date  . APPENDECTOMY    . CATARACT EXTRACTION    . CHOLECYSTECTOMY  04/06/2010   laparoscopic-inflammation with stones  . INGUINAL HERNIA REPAIR  02/20/2011   Procedure: HERNIA REPAIR INGUINAL ADULT;  Surgeon: Earnstine Regal, MD;  Location: [facility];  Service: General;  Laterality: Left;  Repair Left Inguinal Hernia with Mesh  . LUMBAR DISC SURGERY  02-16-11    01-05-11 Lumbar surgery L3(complicated by loss of blood volume)/ 01-09-11 then Lumbar fusion done with retained  hardware   . PROSTATE SURGERY Bilateral 2011  . TONSILLECTOMY AND ADENOIDECTOMY      Home Medications:  Allergies as of 11/06/2019      Reactions   Blood-group Specific Substance    Must be Leukocyte Reduced and Irradiated due to stem cell transplant.       Medication List       Accurate as of November 06, 2019  1:33 PM. If you have any questions, ask your nurse or doctor.        ALIGN PO Take by mouth.   calcium carbonate 500 MG chewable tablet Commonly known as: TUMS - dosed in mg elemental calcium Chew 1 tablet by mouth every 2 (two) hours as needed. HEART BURN   CALCIUM-D PO Take by mouth.   ciclopirox 8 % solution Commonly known as: Penlac Apply topically at bedtime. Apply over nail and surrounding skin. Apply daily over previous coat. After seven (7) days, may remove with alcohol and continue cycle.   citalopram 10 MG tablet Commonly known as: CELEXA Take 1 tablet by mouth once daily   COQ10 PO Take by mouth.   diazepam 5 MG  tablet Commonly known as: VALIUM TAKE 1 TABLET BY MOUTH EVERY 8 HOURS AS NEEDED FOR ANXIETY OR MUSCLE SPASMS   diclofenac Sodium 1 % Gel Commonly known as: VOLTAREN Apply 2 g topically 4 (four) times daily. Rub into affected area of foot 2 to 4 times daily   gabapentin 300 MG capsule Commonly known as: NEURONTIN TAKE 3 CAPSULES BY MOUTH AT BEDTIME FOR  NERVE  PAIN   Klor-Con M20 20 MEQ tablet Generic drug: potassium chloride SA   levothyroxine 100 MCG tablet Commonly known as: SYNTHROID Take 1 tablet by mouth once daily   MULTIVITAMIN ADULT PO Take by mouth.   mupirocin ointment 2 % Commonly  known as: Bactroban Apply 1 application topically 2 (two) times daily.   Myrbetriq 25 MG Tb24 tablet Generic drug: mirabegron ER Take 25 mg by mouth daily.   nitroGLYCERIN 0.2 mg/hr patch Commonly known as: NITRODUR - Dosed in mg/24 hr PLACE 1/4 TO 1/2 OF PATCH OVER AFFECTED REGION. REMOVE AND REPLACE ONCE DAILY. SLIGHTLY ALTER SKIN PLACEMENT DAILY   omeprazole 20 MG capsule Commonly known as: PRILOSEC Take 1 capsule by mouth twice daily   ondansetron 4 MG tablet Commonly known as: ZOFRAN TAKE ONE TABLET BY MOUTH EVERY 8 HOURS AS NEEDED FOR NAUSEA OR VOMITING   Potassium Chloride ER 20 MEQ Tbcr Take 1 tablet by mouth daily.   POTASSIUM PO Take 1 tablet by mouth daily.   Prevnar 13 Susp injection Generic drug: pneumococcal 13-valent conjugate vaccine   Revlimid 10 MG capsule Generic drug: lenalidomide Take 10 mg by mouth daily. 21 days on and 7 days off   tolterodine 4 MG 24 hr capsule Commonly known as: DETROL LA Take 4 mg by mouth daily.   VITAMIN B12 PO Take by mouth.   Vitamin D 1000 units capsule Take 4,000 Units by mouth daily.   warfarin 5 MG tablet Commonly known as: COUMADIN Take as directed by the anticoagulation clinic. If you are unsure how to take this medication, talk to your nurse or doctor. Original instructions: TAKE 1 TABLET BY MOUTH ONCE DAILY  OR  AS  DIRECTED  BY  ANTICOAGULATION  CLINIC       Allergies:  Allergies  Allergen Reactions  . Blood-Group Specific Substance     Must be Leukocyte Reduced and Irradiated due to stem cell transplant.     Family History: Family History  Problem Relation Age of Onset  . Lymphoma Father 61  . Prostate cancer Father   . Clotting disorder Mother        blood clots  . Deep vein thrombosis Mother   . Lung disease Brother   . Diabetes Son   . Diabetes Daughter   . Diabetes Daughter   . Colon cancer Neg Hx   . Esophageal cancer Neg Hx   . Rectal cancer Neg Hx   . Stomach cancer Neg Hx     Social History:  reports that he quit smoking about 50 years ago. He has never used smokeless tobacco. He reports that he does not drink alcohol and does not use drugs.  ROS: All other review of systems were reviewed and are negative except what is noted above in HPI  Physical Exam: BP 123/77   Pulse (!) 57   Temp 97.6 F (36.4 C)   Ht 5' 8.75" (1.746 m)   Wt 209 lb (94.8 kg)   BMI 31.09 kg/m   Constitutional:  Alert and oriented, No acute distress. HEENT: [place] AT, moist mucus membranes.  Trachea midline, no masses. Cardiovascular: No clubbing, cyanosis, or edema. Respiratory: Normal respiratory effort, no increased work of breathing. GI: Abdomen is soft, nontender, nondistended, no abdominal masses GU: No CVA tenderness.  Lymph: No cervical or inguinal lymphadenopathy. Skin: No rashes, bruises or suspicious lesions. Neurologic: Grossly intact, no focal deficits, moving all 4 extremities. Psychiatric: Normal mood and affect.  Laboratory Data: Lab Results  Component Value Date   WBC 5.8 05/18/2019   HGB 15.0 05/18/2019   HCT 45 05/18/2019   MCV 98.4 09/25/2018   PLT 201 05/18/2019    Lab Results  Component Value Date   CREATININE 1.3 05/18/2019    Lab  Results  Component Value Date   PSA 0.00 (L) 09/25/2018   PSA 0.00 Repeated and verified X2. (L) 09/24/2016   PSA 0.00  Repeated and verified X2. (L) 02/17/2016    No results found for: TESTOSTERONE  No results found for: HGBA1C  Urinalysis    Component Value Date/Time   COLORURINE YELLOW 09/24/2016 [place] 09/24/2016 1212   LABSPEC 1.020 09/24/2016 1212   PHURINE 5.5 09/24/2016 1212   GLUCOSEU NEGATIVE 09/24/2016 1212   HGBUR TRACE-INTACT (A) 09/24/2016 1212   BILIRUBINUR NEGATIVE 09/24/2016 1212   KETONESUR NEGATIVE 09/24/2016 1212   PROTEINUR 30 (A) 05/19/2011 1905   UROBILINOGEN 0.2 09/24/2016 1212   NITRITE NEGATIVE 09/24/2016 1212   LEUKOCYTESUR NEGATIVE 09/24/2016 1212    Lab Results  Component Value Date   MUCUS Presence of (A) 09/24/2016   BACTERIA Rare(<10/hpf) (A) 09/24/2016    Pertinent Imaging:  No results found for this or any previous visit.  No results found for this or any previous visit.  No results found for this or any previous visit.  No results found for this or any previous visit.  No results found for this or any previous visit.  No results found for this or any previous visit.  No results found for this or any previous visit.  No results found for this or any previous visit.   Assessment & Plan:    1. Prostate cancer ([place]) -RTC 1 year with PSA  - Urinalysis, Routine w reflex microscopic  2. OAB (overactive bladder) -continue mirabegron 50mg  daily  3. Erectile dysfunction due to arterial insufficiency Tadalafil 20mg  daily   No follow-ups on file.  Nicolette Bang, MD  [facility] [place]

## 2019-11-09 ENCOUNTER — Other Ambulatory Visit: Payer: Self-pay | Admitting: Family Medicine

## 2019-11-10 NOTE — Progress Notes (Signed)
Results sent via my chart 

## 2019-11-12 ENCOUNTER — Other Ambulatory Visit: Payer: Self-pay

## 2019-11-12 ENCOUNTER — Ambulatory Visit (INDEPENDENT_AMBULATORY_CARE_PROVIDER_SITE_OTHER): Payer: Medicare Other | Admitting: Podiatry

## 2019-11-12 DIAGNOSIS — L03031 Cellulitis of right toe: Secondary | ICD-10-CM

## 2019-11-12 DIAGNOSIS — L02611 Cutaneous abscess of right foot: Secondary | ICD-10-CM | POA: Diagnosis not present

## 2019-11-12 DIAGNOSIS — L601 Onycholysis: Secondary | ICD-10-CM

## 2019-11-12 DIAGNOSIS — L600 Ingrowing nail: Secondary | ICD-10-CM | POA: Diagnosis not present

## 2019-11-12 MED ORDER — CEPHALEXIN 500 MG PO CAPS: 500.0000 mg | ORAL_CAPSULE | Freq: Three times a day (TID) | ORAL | 0 refills | Status: DC

## 2019-11-12 NOTE — Patient Instructions (Signed)

## 2019-11-15 LAB — WOUND CULTURE
MICRO NUMBER:: 10929839
RESULT:: NO GROWTH
SPECIMEN QUALITY:: ADEQUATE

## 2019-11-16 ENCOUNTER — Telehealth: Payer: Self-pay | Admitting: Podiatry

## 2019-11-16 NOTE — Telephone Encounter (Signed)
Returned phone call that he has some bleeding from avulsion site, attempted to LVM but is full.  Lanae Crumbly, DPM 11/16/2019

## 2019-11-17 ENCOUNTER — Ambulatory Visit: Payer: Medicare Other | Admitting: Podiatry

## 2019-11-18 ENCOUNTER — Ambulatory Visit: Payer: Medicare Other

## 2019-11-19 DIAGNOSIS — C9000 Multiple myeloma not having achieved remission: Secondary | ICD-10-CM | POA: Diagnosis not present

## 2019-11-19 DIAGNOSIS — R6889 Other general symptoms and signs: Secondary | ICD-10-CM | POA: Diagnosis not present

## 2019-11-25 NOTE — Progress Notes (Signed)
Subjective: 79 year old male presents the office with concerns of right big toenail redness and swelling to the toenail site small amount of clear/bloody drainage.  Denies any recent injury or trauma.  No recent treatment.  Otherwise left sides been doing well has been wearing regular shoes.  Recommend home physical therapy which is been helpful. Denies any systemic complaints such as fevers, chills, nausea, vomiting. No acute changes since last appointment, and no other complaints at this time.   Objective: AAO x3, NAD DP/PT pulses palpable bilaterally, CRT less than 3 seconds Right hallux toenail is loose with underlying nail bed there is localized edema and erythema to the nail border small amount of purulence is identified.  Incurvation present to the nail borders.  No ascending cellulitis.  There is no fluctuation or crepitation.  No open lesions otherwise. In regards to the left side strength appears be much improved.  No significant tenderness palpation to the extensor tendons, tibialis anterior tendon. No pain with calf compression, swelling, warmth, erythema  Assessment: Right hallux onycholysis, ingrown toenail with infection  Plan: -All treatment options discussed with the patient including all alternatives, risks, complications.  -At this time, recommended total nail removal without chemical matricectomy to the right hallux due to infection. Risks and complications were discussed with the patient for which they understand and  verbally consent to the procedure. Under sterile conditions a total of 3 mL of a mixture of 2% lidocaine plain and 0.5% Marcaine plain was infiltrated in a hallux block fashion. Once anesthetized, the skin was prepped in sterile fashion. A tourniquet was then applied. Next the right hallux nail was excised making sure to remove the entire offending nail border.  The nail was loose this morning purulence was identified which was cultured today.  Once the nail was  removed, the area was debrided and the underlying skin was intact. The area was irrigated and hemostasis was obtained.  A dry sterile dressing was applied. After application of the dressing the tourniquet was removed and there is found to be an immediate capillary refill time to the digit. The patient tolerated the procedure well any complications. Post procedure instructions were discussed the patient for which he verbally understood. Follow-up in one week for nail check or sooner if any problems are to arise. Discussed signs/symptoms of worsening infection and directed to call the office immediately should any occur or go directly to the emergency room. In the meantime, encouraged to call the office with any questions, concerns, changes symptoms. -Keflex -Continue home rehab, supportive shoes for the left ankle. -Patient encouraged to call the office with any questions, concerns, change in symptoms.   Trula Slade DPM

## 2019-11-26 ENCOUNTER — Other Ambulatory Visit: Payer: Self-pay

## 2019-11-26 ENCOUNTER — Ambulatory Visit (INDEPENDENT_AMBULATORY_CARE_PROVIDER_SITE_OTHER): Payer: Medicare Other | Admitting: Podiatry

## 2019-11-26 DIAGNOSIS — S96912D Strain of unspecified muscle and tendon at ankle and foot level, left foot, subsequent encounter: Secondary | ICD-10-CM | POA: Diagnosis not present

## 2019-11-26 DIAGNOSIS — L600 Ingrowing nail: Secondary | ICD-10-CM

## 2019-12-01 NOTE — Progress Notes (Signed)
Subjective: 79 year old male presents the office of follow-up evaluation after undergoing right hallux toenail avulsion.  He states the toe is doing better.  He thinks that the pain is because a blister on the side of his toe but otherwise he has been doing well.  Denies any drainage or pus or swelling or redness.  He has no other concerns today. Denies any systemic complaints such as fevers, chills, nausea, vomiting. No acute changes since last appointment, and no other complaints at this time.   Objective: AAO x3, NAD DP/PT pulses palpable bilaterally, CRT less than 3 seconds Status post total nail avulsion of the right hallux.  Only small granular wound is still evident but otherwise seems to be healed and scab is present.  There is no surrounding erythema, ascending cellulitis.  No fluctuation crepitation there is no malodor.  Hyperkeratotic lesion with dried blood on the medial aspect of the hallux without any underlying ulceration drainage or signs of infection noted today. Strength in the left side appears to be improved and he is walking more normal.  No significant central patient on the tibialis anterior.  No other areas of tenderness. No pain with calf compression, swelling, warmth, erythema  Assessment: 79 year old male status post right total nail avulsion, healing; left TA rupture  Plan: -All treatment options discussed with the patient including all alternatives, risks, complications.  -Plan to continue soaking on the right foot Epson salts during the day to keep covered with antibiotic ointment and a bandage in a day leave the area open at nighttime.  Monitoring signs or symptoms of infection.  Not healed the next 1 to 2 weeks let me know. -Regards to left foot continue with home rehab exercises as well as bracing as needed. -Patient encouraged to call the office with any questions, concerns, change in symptoms.   Trula Slade DPM

## 2019-12-16 ENCOUNTER — Other Ambulatory Visit: Payer: Self-pay | Admitting: Family Medicine

## 2019-12-17 ENCOUNTER — Telehealth: Payer: Self-pay

## 2019-12-17 MED ORDER — WARFARIN SODIUM 5 MG PO TABS: 5.0000 mg | ORAL_TABLET | Freq: Every day | ORAL | 5 refills | Status: DC

## 2019-12-17 NOTE — Telephone Encounter (Signed)
Medication sent in. 

## 2019-12-17 NOTE — Telephone Encounter (Signed)
°  LAST APPOINTMENT DATE: 10/16/2019   NEXT APPOINTMENT DATE:@10 /18/21  MEDICATION:Warfarin Sodium 5 MG   PHARMACY: [place], [place] - [address]  COMMENTS: Patient is completely out   Please advise

## 2019-12-21 ENCOUNTER — Other Ambulatory Visit: Payer: Self-pay

## 2019-12-21 ENCOUNTER — Ambulatory Visit (INDEPENDENT_AMBULATORY_CARE_PROVIDER_SITE_OTHER): Payer: Medicare Other | Admitting: General Practice

## 2019-12-21 DIAGNOSIS — Z7901 Long term (current) use of anticoagulants: Secondary | ICD-10-CM

## 2019-12-21 LAB — POCT INR: INR: 2.4 (ref 2.0–3.0)

## 2019-12-21 NOTE — Patient Instructions (Addendum)
Pre visit review using our clinic review tool, if applicable. No additional management support is needed unless otherwise documented below in the visit note.  Continue take 1 tablet daily except 1/2 tablet on Mondays and Fridays.  Re-check in 6 weeks.

## 2019-12-22 ENCOUNTER — Telehealth: Payer: Self-pay

## 2019-12-22 NOTE — Telephone Encounter (Signed)
.   LAST APPOINTMENT DATE: 12/17/2019   NEXT APPOINTMENT DATE:@Visit  date not found  MEDICATION:nitroGLYCERIN (NITRODUR - DOSED IN MG/24 HR) 0.2 mg/hr patch  [place], [place] [address]  **Let patient know to contact pharmacy at the end of the day to make sure medication is ready. **  ** Please notify patient to allow 48-72 hours to process**  **Encourage patient to contact the pharmacy for refills or they can request refills through [facility]**  CLINICAL FILLS OUT ALL BELOW:   LAST REFILL:  QTY:  REFILL DATE:    OTHER COMMENTS:    Okay for refill?  Please advise

## 2019-12-23 ENCOUNTER — Ambulatory Visit (INDEPENDENT_AMBULATORY_CARE_PROVIDER_SITE_OTHER): Payer: Medicare Other | Admitting: Podiatry

## 2019-12-23 ENCOUNTER — Other Ambulatory Visit: Payer: Self-pay

## 2019-12-23 ENCOUNTER — Other Ambulatory Visit: Payer: Self-pay | Admitting: Podiatry

## 2019-12-23 ENCOUNTER — Ambulatory Visit (INDEPENDENT_AMBULATORY_CARE_PROVIDER_SITE_OTHER): Payer: Medicare Other

## 2019-12-23 DIAGNOSIS — M722 Plantar fascial fibromatosis: Secondary | ICD-10-CM

## 2019-12-23 DIAGNOSIS — M79671 Pain in right foot: Secondary | ICD-10-CM

## 2019-12-23 DIAGNOSIS — M7731 Calcaneal spur, right foot: Secondary | ICD-10-CM

## 2019-12-23 MED ORDER — NITROGLYCERIN 0.2 MG/HR TD PT24: MEDICATED_PATCH | TRANSDERMAL | 0 refills | Status: DC

## 2019-12-23 NOTE — Telephone Encounter (Signed)
Rx sent in

## 2019-12-23 NOTE — Progress Notes (Signed)
Dg  

## 2019-12-24 ENCOUNTER — Encounter: Payer: Self-pay | Admitting: Podiatry

## 2019-12-24 ENCOUNTER — Other Ambulatory Visit: Payer: Self-pay | Admitting: Family Medicine

## 2019-12-24 NOTE — Progress Notes (Signed)
Subjective:  Patient ID: Rodney [name], male    DOB: 1940/06/28,  MRN: [id number]  Chief Complaint  Patient presents with  . Foot Pain    Right foot pain     79 y.o. male presents with the above complaint.  Patient presents with a complaint of right heel pain that has been going on for quite some time.  Patient states that it is painful to walk on it hurts when taking the first day.  He states that he has not had discounted pain in a while.  He was treated by Dr. Earleen Newport for for nail avulsion for which she has healed adequately.  He denies any other acute complaints.  He would like to discuss treatment options for it.  He states that in the past he had a heel spur surgery as well.   Review of Systems: Negative except as noted in the HPI. Denies N/V/F/Ch.  Past Medical History:  Diagnosis Date  . Anemia 02-16-11   01-05-11- post surgery-Transfusions x 4 units  . Arthritis   . Bone pain 02/18/2013  . Cellulitis   . Clotting disorder ([place])   . Complication of anesthesia 02-16-11   Pt. speaks of awakening during the surgery from back  surgery  . Degenerative disc disease 02-16-11   01-05-11 L3 fusion done  . Esophageal reflux 05/31/2008   Qualifier: Diagnosis of  By: Arnoldo Morale MD, Balinda Quails   . Hernia 02-16-11   left inguinal hernia at present  . Hypothyroidism 02/01/2014  . Multiple myeloma 02-16-11   suspected tumor  left sacral  . Pancreatitis   . Personal history of traumatic fracture 07/10/2012  . Prostate cancer ([place])   . Pulmonary embolism ([place])   . Second degree atrioventricular block 11/26/2012  . Shortness of breath 02-16-11   hx. Pulmonary emboli x2 (9 yrs/ 3'12 -last)    Current Outpatient Medications:  .  calcium carbonate (TUMS - DOSED IN MG ELEMENTAL CALCIUM) 500 MG chewable tablet, Chew 1 tablet by mouth every 2 (two) hours as needed. HEART BURN  , Disp: , Rfl:  .  Calcium Carbonate-Vitamin D (CALCIUM-D PO), Take by mouth., Disp: , Rfl:  .  cephALEXin (KEFLEX) 500  MG capsule, Take 1 capsule (500 mg total) by mouth 3 (three) times daily., Disp: 21 capsule, Rfl: 0 .  Cholecalciferol (VITAMIN D) 1000 UNITS capsule, Take 4,000 Units by mouth daily. , Disp: , Rfl:  .  ciclopirox (PENLAC) 8 % solution, Apply topically at bedtime. Apply over nail and surrounding skin. Apply daily over previous coat. After seven (7) days, may remove with alcohol and continue cycle., Disp: 6.6 mL, Rfl: 4 .  citalopram (CELEXA) 10 MG tablet, Take 1 tablet by mouth once daily, Disp: 90 tablet, Rfl: 0 .  Coenzyme Q10 (COQ10 PO), Take by mouth., Disp: , Rfl:  .  Cyanocobalamin (VITAMIN B12 PO), Take by mouth., Disp: , Rfl:  .  diazepam (VALIUM) 5 MG tablet, TAKE 1 TABLET BY MOUTH EVERY 8 HOURS AS NEEDED FOR ANXIETY OR MUSCLE SPASMS, Disp: 30 tablet, Rfl: 0 .  diclofenac Sodium (VOLTAREN) 1 % GEL, Apply 2 g topically 4 (four) times daily. Rub into affected area of foot 2 to 4 times daily, Disp: 100 g, Rfl: 2 .  gabapentin (NEURONTIN) 300 MG capsule, TAKE 3 CAPSULES BY MOUTH AT BEDTIME FOR  NERVE  PAIN, Disp: 270 capsule, Rfl: 0 .  KLOR-CON M20 20 MEQ tablet, , Disp: , Rfl: 11 .  levothyroxine (SYNTHROID) 100 MCG  tablet, Take 1 tablet by mouth once daily, Disp: 90 tablet, Rfl: 3 .  mirabegron ER (MYRBETRIQ) 25 MG TB24 tablet, Take 1 tablet (25 mg total) by mouth daily., Disp: 30 tablet, Rfl: 11 .  Multiple Vitamins-Minerals (MULTIVITAMIN ADULT PO), Take by mouth., Disp: , Rfl:  .  mupirocin ointment (BACTROBAN) 2 %, Apply 1 application topically 2 (two) times daily., Disp: 30 g, Rfl: 0 .  nitroGLYCERIN (NITRODUR - DOSED IN MG/24 HR) 0.2 mg/hr patch, PLACE 1/4 TO 1/2 OF PATCH OVER AFFECTED REGION. REMOVE AND REPLACE ONCE DAILY. SLIGHTLY ALTER SKIN PLACEMENT DAILY, Disp: 30 patch, Rfl: 0 .  omeprazole (PRILOSEC) 20 MG capsule, Take 1 capsule by mouth twice daily, Disp: 180 capsule, Rfl: 0 .  ondansetron (ZOFRAN) 4 MG tablet, TAKE ONE TABLET BY MOUTH EVERY 8 HOURS AS NEEDED FOR NAUSEA OR  VOMITING, Disp: 20 tablet, Rfl: 0 .  Potassium Chloride ER 20 MEQ TBCR, Take 1 tablet by mouth daily., Disp: , Rfl:  .  POTASSIUM PO, Take 1 tablet by mouth daily., Disp: , Rfl:  .  PREVNAR 13 SUSP injection, , Disp: , Rfl:  .  Probiotic Product (ALIGN PO), Take by mouth., Disp: , Rfl:  .  REVLIMID 10 MG capsule, Take 10 mg by mouth daily. 21 days on and 7 days off, Disp: , Rfl:  .  tadalafil (CIALIS) 20 MG tablet, Take 1 tablet (20 mg total) by mouth daily as needed for erectile dysfunction., Disp: 10 tablet, Rfl: 5 .  tolterodine (DETROL LA) 4 MG 24 hr capsule, Take 4 mg by mouth daily., Disp: , Rfl:  .  warfarin (COUMADIN) 5 MG tablet, Take 1 tablet (5 mg total) by mouth daily at 4 PM for 1 dose., Disp: 30 tablet, Rfl: 5  Social History   Tobacco Use  Smoking Status Former Smoker  . Quit date: 02/15/1969  . Years since quitting: 50.8  Smokeless Tobacco Never Used  Tobacco Comment   quit 50 yrs    Allergies  Allergen Reactions  . Blood-Group Specific Substance     Must be Leukocyte Reduced and Irradiated due to stem cell transplant.    Objective:  There were no vitals filed for this visit. There is no height or weight on file to calculate BMI. Constitutional Well developed. Well nourished.  Vascular Dorsalis pedis pulses palpable bilaterally. Posterior tibial pulses palpable bilaterally. Capillary refill normal to all digits.  No cyanosis or clubbing noted. Pedal hair growth normal.  Neurologic Normal speech. Oriented to person, place, and time. Epicritic sensation to light touch grossly present bilaterally.  Dermatologic Nails well groomed and normal in appearance. No open wounds. No skin lesions.  Orthopedic: Normal joint ROM without pain or crepitus bilaterally. No visible deformities. Tender to palpation at the calcaneal tuber right. No pain with calcaneal squeeze right. Ankle ROM diminished range of motion right. Silfverskiold Test: positive right.    Radiographs: Taken and reviewed. No acute fractures or dislocations. No evidence of stress fracture.  Plantar heel spur present. Posterior heel spur present.   Assessment:   1. Foot pain, right   2. Plantar fasciitis, right   3. Heel spur, right    Plan:  Patient was evaluated and treated and all questions answered.  Plantar Fasciitis, right - XR reviewed as above.  - Educated on icing and stretching. Instructions given.  - Injection delivered to the plantar fascia as below. - DME: Plantar Fascial Brace - Pharmacologic management: None  Procedure: Injection Tendon/Ligament Location: Right plantar  fascia at the glabrous junction; medial approach. Skin Prep: alcohol Injectate: 0.5 cc 0.5% marcaine plain, 0.5 cc of 1% Lidocaine, 0.5 cc kenalog 10. Disposition: Patient tolerated procedure well. Injection site dressed with a band-aid.  No follow-ups on file.

## 2020-01-20 ENCOUNTER — Encounter: Payer: Self-pay | Admitting: Podiatry

## 2020-01-20 ENCOUNTER — Other Ambulatory Visit: Payer: Self-pay

## 2020-01-20 ENCOUNTER — Ambulatory Visit (INDEPENDENT_AMBULATORY_CARE_PROVIDER_SITE_OTHER): Payer: Medicare Other | Admitting: Podiatry

## 2020-01-20 DIAGNOSIS — M722 Plantar fascial fibromatosis: Secondary | ICD-10-CM

## 2020-01-20 DIAGNOSIS — L97511 Non-pressure chronic ulcer of other part of right foot limited to breakdown of skin: Secondary | ICD-10-CM | POA: Diagnosis not present

## 2020-01-20 NOTE — Progress Notes (Signed)
Subjective:  Patient ID: Rodney [name], male    DOB: 07-07-1940,  MRN: [id number]  Chief Complaint  Patient presents with  . Foot Pain    pt states he still has pain in his right foot     79 y.o. male presents with the above complaint.  Patient presents with a follow-up of right plantar fasciitis.  He states he is doing okay.  He states it still hurts to walk on it.  He is about 40 to 50% improved.  He did not like the brace.  He also has secondary complaint of right hallux distal tip ulceration from tips of the shoes that are rubbing against it given that he was going barefooted while mowing his yard.  He denies any other acute complaints.  He would like to discuss treatment options for it.  Is not a diabetic.   Review of Systems: Negative except as noted in the HPI. Denies N/V/F/Ch.  Past Medical History:  Diagnosis Date  . Anemia 02-16-11   01-05-11- post surgery-Transfusions x 4 units  . Arthritis   . Bone pain 02/18/2013  . Cellulitis   . Clotting disorder ([facility])   . Complication of anesthesia 02-16-11   Pt. speaks of awakening during the surgery from back  surgery  . Degenerative disc disease 02-16-11   01-05-11 L3 fusion done  . Esophageal reflux 05/31/2008   Qualifier: Diagnosis of  By: Lovell Sheehan MD, Balinda Quails   . Hernia 02-16-11   left inguinal hernia at present  . Hypothyroidism 02/01/2014  . Multiple myeloma 02-16-11   suspected tumor  left sacral  . Pancreatitis   . Personal history of traumatic fracture 07/10/2012  . Prostate cancer ([facility])   . Pulmonary embolism ([facility])   . Second degree atrioventricular block 11/26/2012  . Shortness of breath 02-16-11   hx. Pulmonary emboli x2 (9 yrs/ 3'12 -last)    Current Outpatient Medications:  .  calcium carbonate (TUMS - DOSED IN MG ELEMENTAL CALCIUM) 500 MG chewable tablet, Chew 1 tablet by mouth every 2 (two) hours as needed. HEART BURN  , Disp: , Rfl:  .  Calcium Carbonate-Vitamin D (CALCIUM-D PO), Take by mouth., Disp: ,  Rfl:  .  cephALEXin (KEFLEX) 500 MG capsule, Take 1 capsule (500 mg total) by mouth 3 (three) times daily., Disp: 21 capsule, Rfl: 0 .  Cholecalciferol (VITAMIN D) 1000 UNITS capsule, Take 4,000 Units by mouth daily. , Disp: , Rfl:  .  ciclopirox (PENLAC) 8 % solution, Apply topically at bedtime. Apply over nail and surrounding skin. Apply daily over previous coat. After seven (7) days, may remove with alcohol and continue cycle., Disp: 6.6 mL, Rfl: 4 .  citalopram (CELEXA) 10 MG tablet, Take 1 tablet by mouth once daily, Disp: 90 tablet, Rfl: 0 .  Coenzyme Q10 (COQ10 PO), Take by mouth., Disp: , Rfl:  .  Cyanocobalamin (VITAMIN B12 PO), Take by mouth., Disp: , Rfl:  .  diazepam (VALIUM) 5 MG tablet, TAKE 1 TABLET BY MOUTH EVERY 8 HOURS AS NEEDED FOR ANXIETY OR MUSCLE SPASMS, Disp: 30 tablet, Rfl: 0 .  diclofenac Sodium (VOLTAREN) 1 % GEL, Apply 2 g topically 4 (four) times daily. Rub into affected area of foot 2 to 4 times daily, Disp: 100 g, Rfl: 2 .  gabapentin (NEURONTIN) 300 MG capsule, TAKE 3 CAPSULES BY MOUTH AT BEDTIME FOR  NERVE  PAIN, Disp: 270 capsule, Rfl: 0 .  KLOR-CON M20 20 MEQ tablet, , Disp: , Rfl: 11 .  levothyroxine (SYNTHROID) 100 MCG tablet, Take 1 tablet by mouth once daily, Disp: 90 tablet, Rfl: 3 .  mirabegron ER (MYRBETRIQ) 25 MG TB24 tablet, Take 1 tablet (25 mg total) by mouth daily., Disp: 30 tablet, Rfl: 11 .  Multiple Vitamins-Minerals (MULTIVITAMIN ADULT PO), Take by mouth., Disp: , Rfl:  .  mupirocin ointment (BACTROBAN) 2 %, Apply 1 application topically 2 (two) times daily., Disp: 30 g, Rfl: 0 .  nitroGLYCERIN (NITRODUR - DOSED IN MG/24 HR) 0.2 mg/hr patch, PLACE 1/4 TO 1/2 OF PATCH OVER AFFECTED REGION. REMOVE AND REPLACE ONCE DAILY. SLIGHTLY ALTER SKIN PLACEMENT DAILY, Disp: 30 patch, Rfl: 0 .  omeprazole (PRILOSEC) 20 MG capsule, Take 1 capsule by mouth twice daily, Disp: 180 capsule, Rfl: 0 .  ondansetron (ZOFRAN) 4 MG tablet, TAKE ONE TABLET BY MOUTH EVERY 8  HOURS AS NEEDED FOR NAUSEA OR VOMITING, Disp: 20 tablet, Rfl: 0 .  Potassium Chloride ER 20 MEQ TBCR, Take 1 tablet by mouth daily., Disp: , Rfl:  .  POTASSIUM PO, Take 1 tablet by mouth daily., Disp: , Rfl:  .  PREVNAR 13 SUSP injection, , Disp: , Rfl:  .  Probiotic Product (ALIGN PO), Take by mouth., Disp: , Rfl:  .  REVLIMID 10 MG capsule, Take 10 mg by mouth daily. 21 days on and 7 days off, Disp: , Rfl:  .  tadalafil (CIALIS) 20 MG tablet, Take 1 tablet (20 mg total) by mouth daily as needed for erectile dysfunction., Disp: 10 tablet, Rfl: 5 .  tolterodine (DETROL LA) 4 MG 24 hr capsule, Take 4 mg by mouth daily., Disp: , Rfl:  .  warfarin (COUMADIN) 5 MG tablet, Take 1 tablet (5 mg total) by mouth daily at 4 PM for 1 dose., Disp: 30 tablet, Rfl: 5  Social History   Tobacco Use  Smoking Status Former Smoker  . Quit date: 02/15/1969  . Years since quitting: 50.9  Smokeless Tobacco Never Used  Tobacco Comment   quit 50 yrs    Allergies  Allergen Reactions  . Blood-Group Specific Substance     Must be Leukocyte Reduced and Irradiated due to stem cell transplant.    Objective:  There were no vitals filed for this visit. There is no height or weight on file to calculate BMI. Constitutional Well developed. Well nourished.  Vascular Dorsalis pedis pulses palpable bilaterally. Posterior tibial pulses palpable bilaterally. Capillary refill normal to all digits.  No cyanosis or clubbing noted. Pedal hair growth normal.  Neurologic Normal speech. Oriented to person, place, and time. Epicritic sensation to light touch grossly present bilaterally.  Dermatologic Nails well groomed and normal in appearance. No open wounds. No skin lesions.  Orthopedic: Normal joint ROM without pain or crepitus bilaterally. No visible deformities. Tender to palpation at the calcaneal tuber right. No pain with calcaneal squeeze right. Ankle ROM diminished range of motion right. Silfverskiold Test:  positive right.  Right hallux distal tip ulceration limited to the breakdown of skin.  No clinical signs of infection noted.  No purulent drainage noted.  No malodor present.  No concern for deep ulceration noted.   Radiographs: Taken and reviewed. No acute fractures or dislocations. No evidence of stress fracture.  Plantar heel spur present. Posterior heel spur present.   Assessment:   1. Skin ulcer of right great toe, limited to breakdown of skin ([place])   2. Plantar fasciitis, right    Plan:  Patient was evaluated and treated and all questions answered.  Plantar  Fasciitis, right - XR reviewed as above.  - Educated on icing and stretching. Instructions given.  -Second injection delivered to the plantar fascia as below. - DME: He was not able to tolerate the plantar fascial brace due to allergic reaction to the material - Pharmacologic management: None  Right hallux distal toe ulceration limited to the breakdown of the skin -I explained to the patient the etiology of ulceration versus treatment options were discussed.  Given that this is very superficial and limited breakdown of the skin I did not debrided down as there is no healthcare hyperkeratotic lesion to be debrided down.  At this time I discussed with the patient the importance of applying Betadine wet-to-dry dressing.  I also believe he will benefit from open toed shoes to take the pressure off of the distal tip of the hallux.  Patient agrees with the plan surgical shoe was just dispensed   Procedure: Injection Tendon/Ligament Location: Right plantar fascia at the glabrous junction; medial approach. Skin Prep: alcohol Injectate: 0.5 cc 0.5% marcaine plain, 0.5 cc of 1% Lidocaine, 0.5 cc kenalog 10. Disposition: Patient tolerated procedure well. Injection site dressed with a band-aid.  No follow-ups on file.

## 2020-01-22 ENCOUNTER — Other Ambulatory Visit: Payer: Self-pay | Admitting: Family Medicine

## 2020-02-01 ENCOUNTER — Ambulatory Visit (INDEPENDENT_AMBULATORY_CARE_PROVIDER_SITE_OTHER): Payer: Medicare Other | Admitting: General Practice

## 2020-02-01 ENCOUNTER — Other Ambulatory Visit: Payer: Self-pay

## 2020-02-01 DIAGNOSIS — Z7901 Long term (current) use of anticoagulants: Secondary | ICD-10-CM

## 2020-02-01 LAB — POCT INR: INR: 2.5 (ref 2.0–3.0)

## 2020-02-01 NOTE — Patient Instructions (Addendum)
Pre visit review using our clinic review tool, if applicable. No additional management support is needed unless otherwise documented below in the visit note.  Continue take 1 tablet daily except 1/2 tablet on Mondays and Fridays.  Re-check in 6 weeks.

## 2020-02-09 DIAGNOSIS — D1801 Hemangioma of skin and subcutaneous tissue: Secondary | ICD-10-CM | POA: Diagnosis not present

## 2020-02-09 DIAGNOSIS — L821 Other seborrheic keratosis: Secondary | ICD-10-CM | POA: Diagnosis not present

## 2020-02-09 DIAGNOSIS — D225 Melanocytic nevi of trunk: Secondary | ICD-10-CM | POA: Diagnosis not present

## 2020-02-09 DIAGNOSIS — L814 Other melanin hyperpigmentation: Secondary | ICD-10-CM | POA: Diagnosis not present

## 2020-02-09 DIAGNOSIS — L570 Actinic keratosis: Secondary | ICD-10-CM | POA: Diagnosis not present

## 2020-02-11 ENCOUNTER — Ambulatory Visit (INDEPENDENT_AMBULATORY_CARE_PROVIDER_SITE_OTHER): Payer: Medicare Other | Admitting: Podiatry

## 2020-02-11 ENCOUNTER — Other Ambulatory Visit: Payer: Self-pay

## 2020-02-11 DIAGNOSIS — L97511 Non-pressure chronic ulcer of other part of right foot limited to breakdown of skin: Secondary | ICD-10-CM

## 2020-02-11 DIAGNOSIS — M79671 Pain in right foot: Secondary | ICD-10-CM

## 2020-02-11 DIAGNOSIS — M722 Plantar fascial fibromatosis: Secondary | ICD-10-CM | POA: Diagnosis not present

## 2020-02-11 NOTE — Patient Instructions (Signed)

## 2020-02-16 NOTE — Progress Notes (Signed)
Subjective: 79 year old male presents today for follow-up evaluation of right foot pain as well as for wounds on the right big toe. States the wound on the big toe is doing better., Denies any drainage or pus any swelling or redness. Rest the foot pain the injection was helpful but still having discomfort to the heel at times. No recent injury or trauma since last saw him he was last seen in the office. He has no other concerns today. Denies any systemic complaints such as fevers, chills, nausea, vomiting. No acute changes since last appointment, and no other complaints at this time.   Objective: AAO x3, NAD DP/PT pulses palpable bilaterally, CRT less than 3 seconds There is mild tenderness palpation still present on plantar medial tubercle of the calcaneus at the insertion of plantar fascial. The plantar fascial appears to be intact. There is no pain about compression of calcaneus. MMT 5/5. The distal aspect of the hallux are 2 superficial areas of skin breakdown. Patient almost healed at this time. There is no probing, undermining or tunneling. No edema, erythema or signs of infection. No pain with calf compression, swelling, warmth, erythema  Assessment: Right foot plantar fasciectomy right hallux ulcerations  Plan: -All treatment options discussed with the patient including all alternatives, risks, complications.  -She will be debrided the lesions of the right hallux with any complications. Continue with antibiotic ointment dressing changes daily. Continue offloading. -Regards to plantar fasciitis discussed traction, icing daily. Discussed shoe modifications. Hold off another steroid injection today. He can use Voltaren gel as needed. -Patient encouraged to call the office with any questions, concerns, change in symptoms.   Trula Slade DPM

## 2020-02-19 ENCOUNTER — Telehealth: Payer: Self-pay

## 2020-02-19 NOTE — Telephone Encounter (Signed)
Unable to get in contact with the patient to verify which pharmacy he is currently using. VM full. I will send a mychart message in addition to calling him back later.

## 2020-02-19 NOTE — Telephone Encounter (Signed)
  LAST APPOINTMENT DATE: 09/03/2019  NEXT APPOINTMENT DATE:@Visit  date not found  MEDICATION:levothyroxine (SYNTHROID) 100 MCG tablet  PHARMACY:  Comments: Caller states he ran out of his thyroid medication and didnt realize it. He doesn't have a refill. Medication is Lebothyroxine Sodium he has changed pharmacy's.    Please advise

## 2020-02-19 NOTE — Telephone Encounter (Signed)
Attempted to call patient back, No answer, [facility]

## 2020-02-23 NOTE — Telephone Encounter (Signed)
Attempted to call patient, No answer, Unable to leave VM.

## 2020-03-14 ENCOUNTER — Ambulatory Visit: Payer: Medicare Other

## 2020-03-14 ENCOUNTER — Other Ambulatory Visit: Payer: Self-pay

## 2020-03-14 ENCOUNTER — Ambulatory Visit (INDEPENDENT_AMBULATORY_CARE_PROVIDER_SITE_OTHER): Payer: Medicare Other | Admitting: General Practice

## 2020-03-14 DIAGNOSIS — Z7901 Long term (current) use of anticoagulants: Secondary | ICD-10-CM | POA: Diagnosis not present

## 2020-03-14 LAB — POCT INR: INR: 2.3 (ref 2.0–3.0)

## 2020-03-14 NOTE — Patient Instructions (Signed)
Pre visit review using our clinic review tool, if applicable. No additional management support is needed unless otherwise documented below in the visit note.  Continue take 1 tablet daily except 1/2 tablet on Mondays and Fridays.  Re-check in 6 weeks.  

## 2020-03-21 ENCOUNTER — Other Ambulatory Visit: Payer: Self-pay

## 2020-03-21 ENCOUNTER — Telehealth: Payer: Medicare Other | Admitting: Internal Medicine

## 2020-03-21 ENCOUNTER — Telehealth: Payer: Self-pay | Admitting: Family Medicine

## 2020-03-21 ENCOUNTER — Encounter: Payer: Self-pay | Admitting: Internal Medicine

## 2020-03-21 ENCOUNTER — Telehealth: Payer: Self-pay | Admitting: Internal Medicine

## 2020-03-21 DIAGNOSIS — R059 Cough, unspecified: Secondary | ICD-10-CM | POA: Diagnosis not present

## 2020-03-21 DIAGNOSIS — J329 Chronic sinusitis, unspecified: Secondary | ICD-10-CM | POA: Diagnosis not present

## 2020-03-21 DIAGNOSIS — Z20822 Contact with and (suspected) exposure to covid-19: Secondary | ICD-10-CM | POA: Insufficient documentation

## 2020-03-21 DIAGNOSIS — R509 Fever, unspecified: Secondary | ICD-10-CM | POA: Diagnosis not present

## 2020-03-21 NOTE — Telephone Encounter (Signed)
Patient having symptoms since 03/20/20 diarrhea,headache, cough, congestion, sore throat, chills, no fever reported, and no shortness of breath, patient speaking in full sentences, patient stated he would be going to work tomorrow advised patient of quarantine protocols. Patient wanted virtual visit with on call scheduled today at 2:30.

## 2020-03-21 NOTE — Progress Notes (Signed)
Patient has been around someone sick but has not ben tested for COVID.  Symptoms started this past weekend. Including cough, fatigue, runny nose, sore throat, diarrhea, chills, and headache.

## 2020-03-21 NOTE — Progress Notes (Signed)
Patient ID: Rodney [name], male   DOB: 01/15/1941, 80 y.o.   MRN: [id number]   Virtual Visit via telephone Note  This visit type was conducted due to national recommendations for restrictions regarding the COVID-19 pandemic (e.g. social distancing).  This format is felt to be most appropriate for this patient at this time.  All issues noted in this document were discussed and addressed.  No physical exam was performed (except for noted visual exam findings with Video Visits).   I connected with Rodney [name] by telephone and verified that I am speaking with the correct person using two identifiers. Location patient: home Location provider: work Persons participating in the telephone visit: patient, provider  The limitations, risks, security and privacy concerns of performing an evaluation and management service by telephone and the availability of in person appointments have been discussed.  It has also been discussed with the patient that there may be a patient responsible charge related to this service. The patient expressed understanding and agreed to proceed.  Reason for visit: work I appt  HPI: Work in with concerns regarding cough, congestion, fatigue and diarrhea.  History of multiple myeloma (in remission) and prostate cancer.  Was exposed to sick grandchild - 03/15/20.  His symptoms began 03/19/20.  Increased cough, headache and congestion.  Symptoms have progressed.  Reports increased cough, chest congestion and diarrhea today.  Chills.  Also reports some increased sob with minimal exertion.  Sore throat.  No vomiting.  Taking tylenol and zyrtec.  Started nasacort yesterday.    ROS: See pertinent positives and negatives per HPI.  Past Medical History:  Diagnosis Date  . Anemia 02-16-11   01-05-11- post surgery-Transfusions x 4 units  . Arthritis   . Bone pain 02/18/2013  . Cellulitis   . Clotting disorder (HCC)   . Complication of anesthesia 02-16-11   Pt. speaks of  awakening during the surgery from back  surgery  . Degenerative disc disease 02-16-11   01-05-11 L3 fusion done  . Esophageal reflux 05/31/2008   Qualifier: Diagnosis of  By: Lovell Sheehan MD, Balinda Quails   . Hernia 02-16-11   left inguinal hernia at present  . Hypothyroidism 02/01/2014  . Multiple myeloma 02-16-11   suspected tumor  left sacral  . Pancreatitis   . Personal history of traumatic fracture 07/10/2012  . Prostate cancer (HCC)   . Pulmonary embolism (HCC)   . Second degree atrioventricular block 11/26/2012  . Shortness of breath 02-16-11   hx. Pulmonary emboli x2 (9 yrs/ 3'12 -last)    Past Surgical History:  Procedure Laterality Date  . APPENDECTOMY    . CATARACT EXTRACTION    . CHOLECYSTECTOMY  04/06/2010   laparoscopic-inflammation with stones  . INGUINAL HERNIA REPAIR  02/20/2011   Procedure: HERNIA REPAIR INGUINAL ADULT;  Surgeon: Velora Heckler, MD;  Location: [facility];  Service: General;  Laterality: Left;  Repair Left Inguinal Hernia with Mesh  . LUMBAR DISC SURGERY  02-16-11    01-05-11 Lumbar surgery L3(complicated by loss of blood volume)/ 01-09-11 then Lumbar fusion done with retained  hardware   . PROSTATE SURGERY Bilateral 2011  . TONSILLECTOMY AND ADENOIDECTOMY      Family History  Problem Relation Age of Onset  . Lymphoma Father 59  . Prostate cancer Father   . Clotting disorder Mother        blood clots  . Deep vein thrombosis Mother   . Lung disease Brother   . Diabetes Son   .  Diabetes Daughter   . Diabetes Daughter   . Colon cancer Neg Hx   . Esophageal cancer Neg Hx   . Rectal cancer Neg Hx   . Stomach cancer Neg Hx     SOCIAL HX: reviewed.     Current Outpatient Medications:  .  calcium carbonate (TUMS - DOSED IN MG ELEMENTAL CALCIUM) 500 MG chewable tablet, Chew 1 tablet by mouth every 2 (two) hours as needed. HEART BURN, Disp: , Rfl:  .  Calcium Carbonate-Vitamin D (CALCIUM-D PO), Take by mouth., Disp: , Rfl:  .  Cholecalciferol (VITAMIN D) 1000  UNITS capsule, Take 4,000 Units by mouth daily. , Disp: , Rfl:  .  ciclopirox (PENLAC) 8 % solution, Apply topically at bedtime. Apply over nail and surrounding skin. Apply daily over previous coat. After seven (7) days, may remove with alcohol and continue cycle., Disp: 6.6 mL, Rfl: 4 .  citalopram (CELEXA) 10 MG tablet, Take 1 tablet by mouth once daily, Disp: 90 tablet, Rfl: 0 .  Coenzyme Q10 (COQ10 PO), Take by mouth., Disp: , Rfl:  .  Cyanocobalamin (VITAMIN B12 PO), Take by mouth., Disp: , Rfl:  .  diclofenac Sodium (VOLTAREN) 1 % GEL, Apply 2 g topically 4 (four) times daily. Rub into affected area of foot 2 to 4 times daily, Disp: 100 g, Rfl: 2 .  gabapentin (NEURONTIN) 300 MG capsule, TAKE 3 CAPSULES BY MOUTH AT BEDTIME FOR  NERVE  PAIN, Disp: 270 capsule, Rfl: 0 .  KLOR-CON M20 20 MEQ tablet, , Disp: , Rfl: 11 .  levothyroxine (SYNTHROID) 100 MCG tablet, Take 1 tablet by mouth once daily, Disp: 90 tablet, Rfl: 3 .  mirabegron ER (MYRBETRIQ) 25 MG TB24 tablet, Take 1 tablet (25 mg total) by mouth daily., Disp: 30 tablet, Rfl: 11 .  Multiple Vitamins-Minerals (MULTIVITAMIN ADULT PO), Take by mouth., Disp: , Rfl:  .  nitroGLYCERIN (NITRODUR - DOSED IN MG/24 HR) 0.2 mg/hr patch, PLACE 1/4 TO 1/2 OF PATCH OVER AFFECTED REGION. REMOVE AND REPLACE ONCE DAILY. SLIGHTLY ALTER SKIN PLACEMENT DAILY, Disp: 30 patch, Rfl: 0 .  omeprazole (PRILOSEC) 20 MG capsule, Take 1 capsule by mouth twice daily, Disp: 180 capsule, Rfl: 0 .  ondansetron (ZOFRAN) 4 MG tablet, TAKE ONE TABLET BY MOUTH EVERY 8 HOURS AS NEEDED FOR NAUSEA OR VOMITING, Disp: 20 tablet, Rfl: 0 .  Potassium Chloride ER 20 MEQ TBCR, Take 1 tablet by mouth daily., Disp: , Rfl:  .  POTASSIUM PO, Take 1 tablet by mouth daily., Disp: , Rfl:  .  tolterodine (DETROL LA) 4 MG 24 hr capsule, Take 4 mg by mouth daily., Disp: , Rfl:  .  warfarin (COUMADIN) 5 MG tablet, Take 1 tablet (5 mg total) by mouth daily at 4 PM for 1 dose., Disp: 30 tablet,  Rfl: 5 .  tadalafil (CIALIS) 20 MG tablet, Take 1 tablet (20 mg total) by mouth daily as needed for erectile dysfunction. (Patient not taking: Reported on 03/21/2020), Disp: 10 tablet, Rfl: 5  EXAM:  GENERAL: alert.  Answering questions appropriately.  Sounds like he does not feel well.   PSYCH/NEURO: pleasant and cooperative, no obvious depression or anxiety, speech and thought processing grossly intact  ASSESSMENT AND PLAN:  Discussed the following assessment and plan:  Problem List Items Addressed This Visit    Close exposure to COVID-19 virus    Exposure to covid as outlined.  Now with increased cough, congestion, diarrhea and some sob with minimal exertion.  Given past  history and progression of symptoms, I do feel he needs in person evaluation with question of need for cxr, etc.  Pt in agreement.  Checked respiratory clinic scheduled. Blocked. Pt called his local urgent care.  They are open.  He will go now for evaluation.  Call with update.            I discussed the assessment and treatment plan with the patient. The patient was provided an opportunity to ask questions and all were answered. The patient agreed with the plan and demonstrated an understanding of the instructions.   The patient was advised to call back or seek an in-person evaluation if the symptoms worsen or if the condition fails to improve as anticipated.  I provided 15 minutes of non-face-to-face time during this encounter.   Einar Pheasant, MD

## 2020-03-21 NOTE — Assessment & Plan Note (Signed)
Exposure to covid as outlined.  Now with increased cough, congestion, diarrhea and some sob with minimal exertion.  Given past history and progression of symptoms, I do feel he needs in person evaluation with question of need for cxr, etc.  Pt in agreement.  Checked respiratory clinic scheduled. Blocked. Pt called his local urgent care.  They are open.  He will go now for evaluation.  Call with update.

## 2020-03-21 NOTE — Telephone Encounter (Signed)
No answer, no voicemail.  Attempting to start 2:30 virtual visit

## 2020-03-21 NOTE — Telephone Encounter (Signed)
FYI.  Pt called through access nurse with increased cough, congestion and diarrhea. Recent covid exposure.  We were doing virtual visits today and was put on my schedule.  Was with sick grandchild 03/12/20.  Child tested positive this past weekend.  Rodney [name] started having symptoms 03/19/20.  Increased cough - productive, headache now, diarrhea and increased fatigue now.  Reported to me now noticing increased chest congestion and some increased sob with minimal exertion around his house.  Discussed given symptoms the need for in person evaluation, cxr, etc.  He was agreeable and felt he needed this as well.  Tried to schedule appt at respiratory clinic.  Schedule full and blocked after 4:30 today.  He agreed to go to his local urgent care.

## 2020-03-22 ENCOUNTER — Telehealth: Payer: Self-pay

## 2020-03-22 NOTE — Telephone Encounter (Signed)
Patient did a visit yesterday.   Initial Comment Caller states he has a cough, sore throat, diarrhea, body aches , fatigue, and chills . Translation No Nurse Assessment Nurse: Malachi Carl, RN, Jana Half Date/Time Eilene Ghazi Time): 03/21/2020 9:40:38 AM Confirm and document reason for call. If symptomatic, describe symptoms. ---The caller states he has a cough, sore throat, diarrhea, body aches, fatigue, and chills. This started on Saturday. Does the patient have any new or worsening symptoms? ---Yes Will a triage be completed? ---Yes Related visit to physician within the last 2 weeks? ---N/A Does the PT have any chronic conditions? (i.e. diabetes, asthma, this includes High risk factors for pregnancy, etc.) ---Yes List chronic conditions. ---Cancer, history of blood clots, multiple myeloma Is this a behavioral health or substance abuse call? ---No Guidelines Guideline Title Affirmed Question Affirmed Notes Nurse Date/Time (Eastern Time) COVID-19 - Diagnosed or Suspected HIGH RISK for severe COVID complications (e.g., age > 72 years, obesity with BMI > 25, pregnant, chronic lung disease or other chronic medical condition) (Exception: Already seen by PCP and no new or worsening symptoms.) Scarlette Shorts 03/21/2020 9:44:50 AM PLEASE NOTE: All timestamps contained within this report are represented as [place] Standard Time. CONFIDENTIALTY NOTICE: This fax transmission is intended only for the addressee. It contains information that is legally privileged, confidential or otherwise protected from use or disclosure. If you are not the intended recipient, you are strictly prohibited from reviewing, disclosing, copying using or disseminating any of this information or taking any action in reliance on or regarding this information. If you have received this fax in error, please notify us immediately by telephone so that we can arrange for its return to [place]. Phone: [phone], Toll-Free:  [phone], Fax: [phone] Page: 2 of 2 Call Id: [id number] [place]. Time Eilene Ghazi Time) Disposition Final User 03/21/2020 8:48:00 AM Attempt made - message left Scarlette Shorts 03/21/2020 9:29:03 AM Attempt made - no message left Scarlette Shorts 03/21/2020 9:56:42 AM Called On-Call Provider Malachi Carl, RN, Jana Half 03/21/2020 9:54:48 AM Call PCP Now Yes Malachi Carl, RN, Leward Quan Disagree/Comply Comply Caller Understands Yes PreDisposition Call Doctor Care Advice Given Per Guideline CALL PCP NOW: * You need to discuss this with your doctor (or NP/PA). * I'll page the on-call provider now. If you haven't heard from the provider (or me) within 30 minutes, call again. GENERAL CARE ADVICE FOR COVID-19 SYMPTOMS: * HOME REMEDY - HONEY: This old home remedy has been shown to help decrease coughing at night. The adult dosage is 2 teaspoons (10 ml) at bedtime. Honey should not be given to infants under one year of age. CALL BACK IF: * You become worse CARE ADVICE given per COVID-19 - DIAGNOSED OR SUSPECTED (Adult) guideline. Comments User: Morene Antu, RN Date/Time Eilene Ghazi Time): 03/21/2020 8:48:23 AM The mailbox is full and cannot accept any messages at this time. User: Morene Antu, RN Date/Time Eilene Ghazi Time): 03/21/2020 9:29:28 AM The mailbox is full and cannot accept any messages. Paging DoctorName Phone DateTime Result/Outcome Message Type Notes Einar Pheasant - MD [phone] 03/21/2020 9:56:42 AM Called On Call Provider - Reached Doctor Paged Einar Pheasant - MD 03/21/2020 9:57:13 AM Spoke with On Call - General Message Result She is going to have her nurses contact the patient and try to arrange a virtual visit

## 2020-03-22 NOTE — Telephone Encounter (Signed)
Thanks so much Dr. Nicki Reaper for providing great care for him

## 2020-03-22 NOTE — Telephone Encounter (Signed)
What did urgent care say yesterday ? What questions does he have for [place] today  From visit: "Exposure to covid as outlined.  Now with increased cough, congestion, diarrhea and some sob with minimal exertion.  Given past history and progression of symptoms, I do feel he needs in person evaluation with question of need for cxr, etc.  Pt in agreement.  Checked respiratory clinic scheduled. Blocked. Pt called his local urgent care.  They are open.  He will go now for evaluation.  Call with update.  "

## 2020-03-22 NOTE — Telephone Encounter (Signed)
FYI, televisit with Dr. Nicki Reaper yesterday

## 2020-03-23 ENCOUNTER — Encounter (HOSPITAL_COMMUNITY): Payer: Self-pay

## 2020-03-23 ENCOUNTER — Ambulatory Visit (INDEPENDENT_AMBULATORY_CARE_PROVIDER_SITE_OTHER): Payer: Medicare Other

## 2020-03-23 ENCOUNTER — Telehealth: Payer: Self-pay | Admitting: *Deleted

## 2020-03-23 ENCOUNTER — Ambulatory Visit (HOSPITAL_COMMUNITY)
Admission: EM | Admit: 2020-03-23 | Discharge: 2020-03-23 | Disposition: A | Discharge status: Home / Self Care | Payer: Medicare Other | Attending: Family Medicine | Admitting: Family Medicine

## 2020-03-23 ENCOUNTER — Telehealth: Payer: Self-pay

## 2020-03-23 DIAGNOSIS — R062 Wheezing: Secondary | ICD-10-CM | POA: Diagnosis not present

## 2020-03-23 DIAGNOSIS — R059 Cough, unspecified: Secondary | ICD-10-CM | POA: Diagnosis not present

## 2020-03-23 DIAGNOSIS — M545 Low back pain, unspecified: Secondary | ICD-10-CM

## 2020-03-23 DIAGNOSIS — U071 COVID-19: Secondary | ICD-10-CM | POA: Insufficient documentation

## 2020-03-23 DIAGNOSIS — S0990XA Unspecified injury of head, initial encounter: Secondary | ICD-10-CM

## 2020-03-23 DIAGNOSIS — W19XXXA Unspecified fall, initial encounter: Secondary | ICD-10-CM

## 2020-03-23 DIAGNOSIS — J069 Acute upper respiratory infection, unspecified: Secondary | ICD-10-CM | POA: Diagnosis not present

## 2020-03-23 LAB — SARS CORONAVIRUS 2 (TAT 6-24 HRS): SARS Coronavirus 2: POSITIVE — AB

## 2020-03-23 NOTE — Telephone Encounter (Signed)
Pt was seen in the ED today. 

## 2020-03-23 NOTE — ED Triage Notes (Signed)
Patient presents to Urgent Care with complaints of fever last temp 100.0, cough, chills, congestion since Sunday. PCP instructed pt to get chest x-ray. Pt reports he also fell slipped yesterday and hit his head since fall he is having back pain, denies losing consciousness. Pt states on a  blood thinner.

## 2020-03-23 NOTE — Telephone Encounter (Signed)
Schedule pt for OV to follow up.

## 2020-03-23 NOTE — ED Provider Notes (Signed)
Rodney [name]   [id number] 03/23/20 Arrival Time: 7510  ASSESSMENT & PLAN:  1. Viral URI with cough   2. Acute bilateral low back pain without sciatica   3. Minor head injury without loss of consciousness, initial encounter     I have personally viewed the imaging studies ordered this visit. CXR: no acute infiltrates; normal Lumbar spine: no fractures appreciated; hardware in place  COVID-19 testing sent. See letter/work note on file for self-isolation guidelines. OTC symptom care as needed. Discussed.   Follow-up Information    Rodney Olp, MD.   Specialty: Family Medicine Why: As needed. Contact information: Rodney [name] [address] [phone]              Agrees to ED evaluation should he develop a HA or n/v over the next 12 hours.  Reviewed expectations re: course of current medical issues. Questions answered. Outlined signs and symptoms indicating need for more acute intervention. Understanding verbalized. After Visit Summary given.   SUBJECTIVE: History from: patient. Rodney [name] is a 80 y.o. male who reports T 57 F yest. Has been coughing and feeling chilled x 3-4 d; abrupt onset. No CP/SOB reported. Reports his PCP wanted CXR; sent here. Recent travel: none. Normal PO intake without n/v/d. Also reports slipping on ice yesterday evening around 9pm; landed on back, lower; now painful. Ambulatory without difficulty. No extremity sensation changes or weakness. Did hit back of head on ground; no LOC. No vision changes/n/v today. No headache. \  OBJECTIVE:  Vitals:   03/23/20 1023  BP: 120/77  Pulse: 76  Resp: 18  Temp: 98.7 F (37.1 C)  TempSrc: Oral  SpO2: 95%    GCS 15 General appearance: alert; no distress Eyes: PERRLA; EOMI; conjunctiva normal HENT: [place]; AT; without nasal congestion Neck: supple with FROM Lungs: speaks full sentences without difficulty; unlabored Extremities: no edema Skin: warm and  dry Neurologic: normal gait; CN 2-12 grossly intact Psychological: alert and cooperative; normal mood and affect  Labs Reviewed: Results for orders placed or performed in visit on 03/14/20  POCT INR  Result Value Ref Range   INR 2.3 2.0 - 3.0   *Note: Due to a large number of results and/or encounters for the requested time period, some results have not been displayed. A complete set of results can be found in Results Review.   Labs Reviewed  SARS CORONAVIRUS 2 (TAT 6-24 HRS)    Imaging: DG Chest 2 View  Result Date: 03/23/2020 CLINICAL DATA:  Cough, wheezing EXAM: CHEST - 2 VIEW COMPARISON:  05/15/2017 FINDINGS: The heart size and mediastinal contours are within normal limits. No focal airspace consolidation, pleural effusion, or pneumothorax. Degenerative endplate changes throughout the thoracic spine. IMPRESSION: No active cardiopulmonary disease. Electronically Signed   By: Rodney [name] D.O.   On: 03/23/2020 11:40   DG Lumbar Spine Complete  Result Date: 03/23/2020 CLINICAL DATA:  Fall with low back pain. EXAM: LUMBAR SPINE - COMPLETE 4+ VIEW COMPARISON:  MRI lumbar spine dated October 07, 2017. Lumbar spine x-rays dated Jul 19, 2017. FINDINGS: Five lumbar type vertebral bodies. Prior L3 corpectomy and L2-L4 posterior fusion. No evidence of hardware failure or loosening. No acute fracture or subluxation. Vertebral body heights are preserved. Alignment is normal. Unchanged mild disc height loss at L1-L2. Unchanged advanced facet arthropathy at L4-L5 and L5-S1. The sacroiliac joints are unremarkable. IMPRESSION: 1. No acute osseous abnormality. 2. Prior L3 corpectomy and L2-L4 posterior fusion without hardware complication. 3.  Unchanged mild degenerative disc disease at L1-L2. Electronically Signed   By: Rodney [name] M.D.   On: 03/23/2020 11:39    Allergies  Allergen Reactions  . Blood-Group Specific Substance     Must be Leukocyte Reduced and Irradiated due to stem cell  transplant.     Past Medical History:  Diagnosis Date  . Anemia 02-16-11   01-05-11- post surgery-Transfusions x 4 units  . Arthritis   . Bone pain 02/18/2013  . Cellulitis   . Clotting disorder ([place])   . Complication of anesthesia 02-16-11   Pt. speaks of awakening during the surgery from back  surgery  . Degenerative disc disease 02-16-11   01-05-11 L3 fusion done  . Esophageal reflux 05/31/2008   Qualifier: Diagnosis of  By: Rodney Morale MD, Rodney [name]   . Hernia 02-16-11   left inguinal hernia at present  . Hypothyroidism 02/01/2014  . Multiple myeloma 02-16-11   suspected tumor  left sacral  . Pancreatitis   . Personal history of traumatic fracture 07/10/2012  . Prostate cancer ([place])   . Pulmonary embolism ([place])   . Second degree atrioventricular block 11/26/2012  . Shortness of breath 02-16-11   hx. Pulmonary emboli x2 (9 yrs/ 3'12 -last)   Social History   Socioeconomic History  . Marital status: Married    Spouse name: Not on file  . Number of children: 3  . Years of education: Not on file  . Highest education level: Not on file  Occupational History  . Occupation: retired  Tobacco Use  . Smoking status: Former Smoker    Quit date: 02/15/1969    Years since quitting: 51.1  . Smokeless tobacco: Never Used  . Tobacco comment: quit 50 yrs  Substance and Sexual Activity  . Alcohol use: No    Alcohol/week: 0.0 standard drinks  . Drug use: No  . Sexual activity: Yes  Other Topics Concern  . Not on file  Social History Narrative   Married. 3 kids. 4 grandkids   Regular exercise-yes      Working at [employer] 35 hours a week. Considering [employer] again   Social Determinants of Health   Financial Resource Strain: Not on file  Food Insecurity: Not on file  Transportation Needs: Not on file  Physical Activity: Not on file  Stress: Not on file  Social Connections: Not on file  Intimate Partner Violence: Not on file   Family History  Problem Relation Age of Onset   . Lymphoma Father 73  . Prostate cancer Father   . Clotting disorder Mother        blood clots  . Deep vein thrombosis Mother   . Lung disease Brother   . Diabetes Son   . Diabetes Daughter   . Diabetes Daughter   . Colon cancer Neg Hx   . Esophageal cancer Neg Hx   . Rectal cancer Neg Hx   . Stomach cancer Neg Hx    Past Surgical History:  Procedure Laterality Date  . APPENDECTOMY    . CATARACT EXTRACTION    . CHOLECYSTECTOMY  04/06/2010   laparoscopic-inflammation with stones  . INGUINAL HERNIA REPAIR  02/20/2011   Procedure: HERNIA REPAIR INGUINAL ADULT;  Surgeon: Earnstine Regal, MD;  Location: [facility];  Service: General;  Laterality: Left;  Repair Left Inguinal Hernia with Mesh  . LUMBAR DISC SURGERY  02-16-11    01-05-11 Lumbar surgery L3(complicated by loss of blood volume)/ 01-09-11 then Lumbar fusion done with retained  hardware   . PROSTATE SURGERY Bilateral 2011  . TONSILLECTOMY AND ADENOIDECTOMY       Vanessa Kick, MD 03/23/20 1358

## 2020-03-23 NOTE — Telephone Encounter (Signed)
Butch Penny thanks for taking good care of him.

## 2020-03-23 NOTE — Telephone Encounter (Signed)
Dr. Hunter, please see message. ?

## 2020-03-23 NOTE — Telephone Encounter (Signed)
Patient was seen in the ED, and unable to leave a voicemail.

## 2020-03-23 NOTE — Telephone Encounter (Signed)
Nurse Assessment Nurse: Manson Allan, RN, Sarah Date/Time Eilene Ghazi Time): 03/23/2020 5:51:40 AM Confirm and document reason for call. If symptomatic, describe symptoms. ---Caller states Pt. fell last night on the ice. He landed on his back and hit his head. Pt. is awake at present time. Does the patient have any new or worsening symptoms? ---Yes Will a triage be completed? ---Yes Related visit to physician within the last 2 weeks? ---No Does the PT have any chronic conditions? (i.e. diabetes, asthma, this includes High risk factors for pregnancy, etc.) ---Yes List chronic conditions. ---Multiple myeloma remission Rod and screws in Spine Is this a behavioral health or substance abuse call? ---No Guidelines Guideline Title Affirmed Question Affirmed Notes Nurse Date/Time (Eastern Time) Head Injury Large swelling or bruise > 2 inches (5 cm) Moon, RN, Judson Roch 03/23/2020 5:53:14 AM Disp. Time Eilene Ghazi Time) Disposition Final User 03/23/2020 5:49:03 AM Send to Urgent Colonel Bald 03/23/2020 5:58:45 AM Go to ED Now Yes Manson Allan, RN, [place] Disagree/Comply Disagree PLEASE NOTE: All timestamps contained within this report are represented as [place] Standard Time. CONFIDENTIALTY NOTICE: This fax transmission is intended only for the addressee. It contains information that is legally privileged, confidential or otherwise protected from use or disclosure. If you are not the intended recipient, you are strictly prohibited from reviewing, disclosing, copying using or disseminating any of this information or taking any action in reliance on or regarding this information. If you have received this fax in error, please notify us immediately by telephone so that we can arrange for its return to [place]. Phone: [phone], Toll-Free: [phone], Fax: [phone] Page: 2 of 2 Call Id: [id number] Caller Understands Yes PreDisposition InappropriateToAsk Care Advice Given Per Guideline GO TO ED NOW: * You need  to be seen in the Emergency Department. USE A COLD PACK FOR PAIN, SWELLING, OR BRUISING: * Put a cold pack or an ice bag (wrapped in a moist towel) on the area for 20 minutes. * Repeat in 1 hour, then as needed. * This will help decrease pain, swelling, and bruising. NOTHING BY MOUTH: * Do not allow any eating or drinking. * Also avoid pain medicines until seen. (Reason: condition may need surgery and general anesthesia). CARE ADVICE given per Head Injury (Adult) guideline. Referrals GO TO FACILITY REFUSE

## 2020-03-23 NOTE — Telephone Encounter (Signed)
Noted  

## 2020-03-23 NOTE — Discharge Instructions (Signed)
You have been tested for COVID-19 today. °If your test returns positive, you will receive a phone call from [facility] regarding your results. °Negative test results are not called. °Both positive and negative results area always visible on MyChart. °If you do not have a MyChart account, sign up instructions are provided in your discharge papers. °Please do not hesitate to contact us should you have questions or concerns. ° °

## 2020-03-23 NOTE — Telephone Encounter (Signed)
Pt presented to the office parking lot today saying he has an appt he thinks. Was told by someone that he needs someone to lay eyes on him due to being high risk. Pt proceeded to tell me he has COVID. Also he fell yesterday outside and hurt his lower back.He has been trying to get thru to the office but no one will call him. Told pt I am sorry but the office does not open today till 10:00AM. Asked pt his name and birth date and told him let me go check the schedule and I will be back out. Pt verbalized understanding. Went back out to pt told him I Looked at your chart you had telemedicine visit on Monday 1/17  Message from 1/18 Dr. Yong Channel said you were suppose to go to an Urgent care and be evaluated and get back to him. Pt said he went to an Urgent care in [place]. Asked him if they did a chest x-ray pt said no. Told pt need to go to [facility] now to be evaluated and have a xray done. I will let Dr. Yong Channel know and someone will call you later. Pt verbalized understanding and asked about infusion. Told him I will let Dr. Yong Channel know that you are interested in them. Pt verbalized understanding.

## 2020-03-24 ENCOUNTER — Telehealth: Payer: Self-pay | Admitting: Family

## 2020-03-24 NOTE — Telephone Encounter (Signed)
Called to discuss with patient about COVID-19 symptoms and the use of one of the available treatments for those with mild to moderate Covid symptoms and at a high risk of hospitalization.  Pt appears to qualify for outpatient treatment due to co-morbid conditions and/or a member of an at-risk group in accordance with the FDA Emergency Use Authorization.    Symptom onset: 03/19/20 Vaccinated: No  Immunocompromised? No Qualifiers: Prostate cancer, hyperlipidemia, multiple myeloma in remission   I was unable to get in contact with Mr. Stanish and not able to leave a voicemail due to the mailbox being full. A MyChart message was sent with follow up information. Based on chart review he would be a good candidate for Sotrovimab.  Greg Calone, NP 03/24/2020 10:13 AM   

## 2020-04-25 ENCOUNTER — Ambulatory Visit: Payer: Medicare Other

## 2020-04-27 ENCOUNTER — Telehealth: Payer: Self-pay

## 2020-04-27 MED ORDER — CITALOPRAM HYDROBROMIDE 10 MG PO TABS
10.0000 mg | ORAL_TABLET | Freq: Every day | ORAL | 0 refills | Status: DC
Start: 2020-04-27 — End: 2020-07-27

## 2020-04-27 MED ORDER — OMEPRAZOLE 20 MG PO CPDR
20.0000 mg | DELAYED_RELEASE_CAPSULE | Freq: Two times a day (BID) | ORAL | 2 refills | Status: DC
Start: 2020-04-27 — End: 2021-01-30

## 2020-04-27 NOTE — Telephone Encounter (Signed)
Rx sent 

## 2020-04-27 NOTE — Telephone Encounter (Signed)
..   LAST APPOINTMENT DATE: 03/23/2020   NEXT APPOINTMENT DATE:@Visit  date not found  MEDICATION:citalopram (CELEXA) 10 MG tablet omeprazole (PRILOSEC) 20 MG capsule     [facility] [address] [place], [place]

## 2020-04-27 NOTE — Addendum Note (Signed)
Addended by: Clyde Lundborg A on: 04/27/2020 04:02 PM   Modules accepted: Orders

## 2020-05-04 ENCOUNTER — Ambulatory Visit (INDEPENDENT_AMBULATORY_CARE_PROVIDER_SITE_OTHER): Payer: Medicare Other | Admitting: General Practice

## 2020-05-04 ENCOUNTER — Other Ambulatory Visit: Payer: Self-pay

## 2020-05-04 DIAGNOSIS — Z7901 Long term (current) use of anticoagulants: Secondary | ICD-10-CM | POA: Diagnosis not present

## 2020-05-04 LAB — POCT INR: INR: 1.9 — AB (ref 2.0–3.0)

## 2020-05-04 NOTE — Progress Notes (Signed)
I have reviewed the results and agree with this plan   

## 2020-05-04 NOTE — Patient Instructions (Addendum)
Pre visit review using our clinic review tool, if applicable. No additional management support is needed unless otherwise documented below in the visit note.  Take extra 1/2 tablet today and then continue take 1 tablet daily except 1/2 tablet on Mondays and Fridays.  Re-check in 6 weeks.

## 2020-05-12 ENCOUNTER — Telehealth: Payer: Self-pay

## 2020-05-12 MED ORDER — GABAPENTIN 300 MG PO CAPS: ORAL_CAPSULE | ORAL | 3 refills | Status: DC

## 2020-05-12 NOTE — Telephone Encounter (Signed)
Rx sent 

## 2020-05-12 NOTE — Telephone Encounter (Signed)
..   LAST APPOINTMENT DATE: 04/27/2020   NEXT APPOINTMENT DATE:@Visit  date not found  MEDICATION:gabapentin (NEURONTIN) 300 MG capsule    [facility] [address] [place]

## 2020-05-16 DIAGNOSIS — R6889 Other general symptoms and signs: Secondary | ICD-10-CM | POA: Diagnosis not present

## 2020-05-16 DIAGNOSIS — C9000 Multiple myeloma not having achieved remission: Secondary | ICD-10-CM | POA: Diagnosis not present

## 2020-05-20 ENCOUNTER — Other Ambulatory Visit: Payer: Self-pay

## 2020-05-20 DIAGNOSIS — N3281 Overactive bladder: Secondary | ICD-10-CM

## 2020-05-20 MED ORDER — TOLTERODINE TARTRATE ER 4 MG PO CP24: 4.0000 mg | ORAL_CAPSULE | Freq: Every day | ORAL | 6 refills | Status: DC

## 2020-05-26 ENCOUNTER — Ambulatory Visit (INDEPENDENT_AMBULATORY_CARE_PROVIDER_SITE_OTHER): Payer: Medicare Other | Admitting: Podiatry

## 2020-05-26 ENCOUNTER — Other Ambulatory Visit: Payer: Self-pay

## 2020-05-26 ENCOUNTER — Ambulatory Visit (INDEPENDENT_AMBULATORY_CARE_PROVIDER_SITE_OTHER): Payer: Medicare Other

## 2020-05-26 DIAGNOSIS — M79674 Pain in right toe(s): Secondary | ICD-10-CM | POA: Diagnosis not present

## 2020-05-26 DIAGNOSIS — M722 Plantar fascial fibromatosis: Secondary | ICD-10-CM

## 2020-05-26 DIAGNOSIS — B351 Tinea unguium: Secondary | ICD-10-CM | POA: Diagnosis not present

## 2020-05-26 DIAGNOSIS — M79675 Pain in left toe(s): Secondary | ICD-10-CM | POA: Diagnosis not present

## 2020-05-26 DIAGNOSIS — Z7901 Long term (current) use of anticoagulants: Secondary | ICD-10-CM | POA: Diagnosis not present

## 2020-05-26 MED ORDER — TRIAMCINOLONE ACETONIDE 10 MG/ML IJ SUSP
10.0000 mg | Freq: Once | INTRAMUSCULAR | Status: AC
Start: 2020-05-26 — End: 2020-05-26
  Administered 2020-05-26: 10 mg

## 2020-05-26 NOTE — Patient Instructions (Signed)

## 2020-05-29 NOTE — Progress Notes (Signed)
Subjective: 80 y.o. returns the office today for painful, elongated, thickened toenails which he cannot trim himself. Denies any redness or drainage around the nails.  He also has an experiencing heel pain of the right side worse than left.  No recent injury or trauma.  No swelling.  Pain is intermittent.  He does work 3 jobs and is on his feet quite a bit which aggravates his symptoms.  Denies any acute changes since last appointment and no new complaints today. Denies any systemic complaints such as fevers, chills, nausea, vomiting.   PCP: Marin Olp, MD   Objective: AAO 3, NAD DP/PT pulses palpable, CRT less than 3 seconds Nails hypertrophic, dystrophic, elongated, brittle, discolored 10. There is tenderness overlying the nails 1-5 bilaterally. There is no surrounding erythema or drainage along the nail sites. Tenderness to palpation along the plantar medial tubercle of the calcaneus at the insertion of plantar fascia on the right foot. There is no pain along the course of the plantar fascia within the arch of the foot. Plantar fascia appears to be intact. There is no pain with lateral compression of the calcaneus or pain with vibratory sensation. There is no pain along the course or insertion of the achilles tendon. No other areas of tenderness to bilateral lower extremities. No open lesions No pain with calf compression, swelling, warmth, erythema.  Assessment: Patient presents with symptomatic onychomycosis; right heel pain, plantar fasciitis  Plan: -Treatment options including alternatives, risks, complications were discussed -Nails sharply debrided 10 without complication/bleeding. -Steroid injection performed for the right heel.  See procedure note below.  Continue stretching, icing daily.  Discussed shoe modifications and wearing good arch supports. -Discussed daily foot inspection. If there are any changes, to call the office immediately.  -Follow-up in 3 months or sooner if  any problems are to arise. In the meantime, encouraged to call the office with any questions, concerns, changes symptoms.  Procedure: Injection Tendon/Ligament Discussed alternatives, risks, complications and verbal consent was obtained.  Location: Right plantar fascia at the glabrous junction; medial approach. Skin Prep: Alcohol  Injectate: 0.5cc 0.5% marcaine plain, 0.5 cc 2% lidocaine plain and, 1 cc kenalog 10. Disposition: Patient tolerated procedure well. Injection site dressed with a band-aid.  Post-injection care was discussed and return precautions discussed.    Celesta Gentile, DPM

## 2020-05-31 ENCOUNTER — Ambulatory Visit: Payer: Medicare Other

## 2020-06-02 ENCOUNTER — Other Ambulatory Visit: Payer: Self-pay | Admitting: Family Medicine

## 2020-07-11 ENCOUNTER — Ambulatory Visit: Payer: Medicare Other | Admitting: Podiatry

## 2020-07-13 ENCOUNTER — Ambulatory Visit: Payer: Medicare Other

## 2020-07-21 ENCOUNTER — Other Ambulatory Visit: Payer: Self-pay

## 2020-07-21 ENCOUNTER — Ambulatory Visit (INDEPENDENT_AMBULATORY_CARE_PROVIDER_SITE_OTHER): Payer: Medicare Other | Admitting: Podiatry

## 2020-07-21 DIAGNOSIS — M79671 Pain in right foot: Secondary | ICD-10-CM | POA: Diagnosis not present

## 2020-07-21 DIAGNOSIS — M79675 Pain in left toe(s): Secondary | ICD-10-CM

## 2020-07-21 DIAGNOSIS — M79674 Pain in right toe(s): Secondary | ICD-10-CM

## 2020-07-21 DIAGNOSIS — B351 Tinea unguium: Secondary | ICD-10-CM

## 2020-07-21 DIAGNOSIS — Z7901 Long term (current) use of anticoagulants: Secondary | ICD-10-CM

## 2020-07-21 DIAGNOSIS — M722 Plantar fascial fibromatosis: Secondary | ICD-10-CM

## 2020-07-26 ENCOUNTER — Other Ambulatory Visit: Payer: Self-pay | Admitting: Family Medicine

## 2020-07-26 NOTE — Progress Notes (Signed)
Subjective: 80 y.o. returns the office today for painful, elongated, thickened toenails which he cannot trim himself. Denies any redness or drainage around the nails.  He does get ingrown toenail of the big toe but currently denies any drainage or pus or any swelling.  Also he states he has been having recurrent pain in the right heel and he also had some pain in the top of his right foot.  No recent injury or trauma.  Standing all day at work makes his feet hurt more.  No increase in swelling or redness.  Denies any systemic complaints such as fevers, chills, nausea, vomiting.   PCP: Marin Olp, MD   Objective: AAO 3, NAD DP/PT pulses palpable, CRT less than 3 seconds Nails hypertrophic, dystrophic, elongated, brittle, discolored 10. There is tenderness overlying the nails 1-5 bilaterally. There is no surrounding erythema or drainage along the nail sites. Tenderness to palpation along the plantar medial tubercle of the calcaneus at the insertion of plantar fascia on the right foot. There is no pain along the course of the plantar fascia within the arch of the foot. Plantar fascia appears to be intact. There is no pain with lateral compression of the calcaneus or pain with vibratory sensation. There is no pain along the course or insertion of the achilles tendon.  Mild discomfort present on the dorsal aspect of the midfoot but no specific area of pinpoint tenderness.  No other areas of tenderness to bilateral lower extremities. No open lesions No pain with calf compression, swelling, warmth, erythema.  Assessment: Patient presents with symptomatic onychomycosis; right heel pain, plantar fasciitis/ tendinitis  Plan: -Treatment options including alternatives, risks, complications were discussed -Nails sharply debrided 10 without complication/bleeding. -Offered steroid injection therapy was to hold off.  I encouraged to continue stretching, icing daily.  Voltaren gel as needed.  Discussed  shoes and good arch support.  Trula Slade DPM

## 2020-08-04 DIAGNOSIS — J329 Chronic sinusitis, unspecified: Secondary | ICD-10-CM | POA: Diagnosis not present

## 2020-08-04 DIAGNOSIS — R059 Cough, unspecified: Secondary | ICD-10-CM | POA: Diagnosis not present

## 2020-08-04 DIAGNOSIS — J3489 Other specified disorders of nose and nasal sinuses: Secondary | ICD-10-CM | POA: Diagnosis not present

## 2020-08-04 DIAGNOSIS — J029 Acute pharyngitis, unspecified: Secondary | ICD-10-CM | POA: Diagnosis not present

## 2020-08-09 ENCOUNTER — Telehealth: Payer: Self-pay | Admitting: General Practice

## 2020-08-09 NOTE — Telephone Encounter (Signed)
Attempted to contact patient to schedule INR appointment but no answer and VM is full.  I will call again next week.

## 2020-08-10 NOTE — Telephone Encounter (Signed)
Patient returned call please call again when able.

## 2020-08-11 ENCOUNTER — Telehealth: Payer: Self-pay

## 2020-08-11 NOTE — Telephone Encounter (Signed)
FYI

## 2020-08-11 NOTE — Telephone Encounter (Signed)
FYI:  Patient came in this morning to see [facility].   I informed patient that Jenny Reichmann was not in our office today.  I pulled patients appointment desk to see if patient had a standing appointment.  Patient did not.   Patient wanted to know where Jenny Reichmann was today.  I began to pull Cindy's office locations.  Patient got aggravated pretty quickly, turned around and walked out stating he would go to one of the other office locations b/c I was too slow.    Patient later called back asking if there was anywhere he could get his blood checked today.  Before I could respond patient stated he was at [facility] and that they were taking him back.  Then stated he was going to find another provider and thanked me for my assistance and laughed.

## 2020-08-12 NOTE — Telephone Encounter (Signed)
Patient is certainly welcome to look for another provider if we are not the best fit for him.

## 2020-08-18 ENCOUNTER — Other Ambulatory Visit: Payer: Self-pay | Admitting: Family Medicine

## 2020-08-19 ENCOUNTER — Telehealth: Payer: Self-pay

## 2020-08-19 ENCOUNTER — Telehealth (HOSPITAL_BASED_OUTPATIENT_CLINIC_OR_DEPARTMENT_OTHER): Payer: Self-pay | Admitting: Family Medicine

## 2020-08-19 DIAGNOSIS — Z7901 Long term (current) use of anticoagulants: Secondary | ICD-10-CM

## 2020-08-19 NOTE — Telephone Encounter (Signed)
Patient called in requesting to speak with Dr.Hunter's nurse. Advised that they are with patients but would send message. Rodney [name] says he is going over to the [place] location to have his coumadin checked and to give him a call back. Rodney [name] then hung up.

## 2020-08-19 NOTE — Telephone Encounter (Signed)
Pt came in, in perceived distress, wanting his Coumadin checked and possibly refilled today by a Provider. I explained he would need to be established with our office, but I could make a NP appt for him. I urged him to go to the ED if needing attn. today

## 2020-08-19 NOTE — Telephone Encounter (Signed)
Patient has called in to request refill on coumadin.  States he uses [facility] on [address].

## 2020-08-22 ENCOUNTER — Other Ambulatory Visit: Payer: Self-pay | Admitting: General Practice

## 2020-08-22 DIAGNOSIS — Z0000 Encounter for general adult medical examination without abnormal findings: Secondary | ICD-10-CM | POA: Diagnosis not present

## 2020-08-22 DIAGNOSIS — E039 Hypothyroidism, unspecified: Secondary | ICD-10-CM | POA: Diagnosis not present

## 2020-08-22 DIAGNOSIS — R6889 Other general symptoms and signs: Secondary | ICD-10-CM | POA: Diagnosis not present

## 2020-08-22 DIAGNOSIS — C9000 Multiple myeloma not having achieved remission: Secondary | ICD-10-CM | POA: Diagnosis not present

## 2020-08-22 DIAGNOSIS — Z7901 Long term (current) use of anticoagulants: Secondary | ICD-10-CM

## 2020-08-22 DIAGNOSIS — C9001 Multiple myeloma in remission: Secondary | ICD-10-CM | POA: Diagnosis not present

## 2020-08-22 MED ORDER — WARFARIN SODIUM 5 MG PO TABS: 5.0000 mg | ORAL_TABLET | Freq: Every day | ORAL | 0 refills | Status: DC

## 2020-08-22 NOTE — Telephone Encounter (Signed)
Yes thanks 

## 2020-08-22 NOTE — Telephone Encounter (Signed)
I sent a message to Jenny Reichmann also, last Rx was in 12/2019. Ok to refill?

## 2020-08-22 NOTE — Addendum Note (Signed)
Addended by: Clyde Lundborg A on: 08/22/2020 12:52 PM   Modules accepted: Orders

## 2020-08-22 NOTE — Telephone Encounter (Signed)
2nd attempt to call patient to schedule INR.  VM is full.  30 day re-fill of warfarin sent to Rodney [name] today, 6/20.

## 2020-08-22 NOTE — Telephone Encounter (Signed)
Called spouses # and VM was also full.

## 2020-08-22 NOTE — Telephone Encounter (Signed)
Coumadin refilled.

## 2020-08-22 NOTE — Telephone Encounter (Signed)
Nurse Assessment Nurse: Valentino Nose, RN, [place] Date/Time (Eastern Time): 08/20/2020 10:46:27 AM Please select the assessment type ---Refill Additional Documentation ---Caller states he is out of his Coumadin and the refill was suppose to be handled yesterday but it is not at the pharmacy. Does the patient have enough medication to last until the office opens? ---No Additional Documentation ---[place] at [facility] [phone] Disp. Time Eilene Ghazi Time) Disposition Final User 08/20/2020 11:03:51 AM Pharmacy Call Valentino Nose, [place], [place] Reason: Called in volume sufficient for Coumadin to last pt until Tuesday when the office opens up. [place] [phone] 08/20/2020 11:04:02 AM Clinical Call Yes Valentino Nose, [place], [place]

## 2020-09-01 ENCOUNTER — Ambulatory Visit (HOSPITAL_BASED_OUTPATIENT_CLINIC_OR_DEPARTMENT_OTHER): Payer: Medicare Other | Admitting: Family Medicine

## 2020-09-13 ENCOUNTER — Telehealth: Payer: Self-pay | Admitting: Podiatry

## 2020-09-13 NOTE — Telephone Encounter (Signed)
Patient had called and left a message and I called the patient back today and patient is still no better with the heel pain and patient stated that he was icing and elevating and is wearing supportive shoes and the stretching is not helping and I have cramps and hurts with standing and I do have support socks and per Dr Jacqualyn Posey patient can come in and get an injection. Rodney [name]

## 2020-09-13 NOTE — Telephone Encounter (Signed)
Patient is requesting a call back from Dr. Jacqualyn Posey or the nurse regarding PF pain. Stated he was informed to call back if pain became worse.

## 2020-09-14 NOTE — Telephone Encounter (Signed)
Pt is requesting to come in tomorrow morning, what time do you suggest?

## 2020-09-15 ENCOUNTER — Ambulatory Visit (INDEPENDENT_AMBULATORY_CARE_PROVIDER_SITE_OTHER): Payer: Medicare Other | Admitting: Podiatry

## 2020-09-15 ENCOUNTER — Other Ambulatory Visit: Payer: Self-pay

## 2020-09-15 DIAGNOSIS — M722 Plantar fascial fibromatosis: Secondary | ICD-10-CM

## 2020-09-15 DIAGNOSIS — Z7901 Long term (current) use of anticoagulants: Secondary | ICD-10-CM

## 2020-09-15 DIAGNOSIS — M79674 Pain in right toe(s): Secondary | ICD-10-CM

## 2020-09-15 DIAGNOSIS — B351 Tinea unguium: Secondary | ICD-10-CM | POA: Diagnosis not present

## 2020-09-15 MED ORDER — TRIAMCINOLONE ACETONIDE 10 MG/ML IJ SUSP
10.0000 mg | Freq: Once | INTRAMUSCULAR | Status: AC
  Administered 2020-09-15: 10 mg

## 2020-09-15 NOTE — Progress Notes (Signed)
Subjective: 80 year old male presents the office today for concerns of recurrent heel pain on the right side.  He states the pain is gone away but has been coming back intermittently over the last 2 to 3 weeks and he is requesting a steroid injection.  No recent injury or trauma.  States he has been walking differently because of his left hip to hurt because of the right heel pain.  Also asking for the nails on the right foot the second and fourth toe to be trimmed as they are thick and elongated but not trim them himself are causing discomfort. Denies any systemic complaints such as fevers, chills, nausea, vomiting. No acute changes since last appointment, and no other complaints at this time.   Objective: AAO x3, NAD DP/PT pulses palpable bilaterally, CRT less than 3 seconds There is tenderness palpation on plantar medial tubercle of the calcaneus at the insertion of plantar fascial.  The plantar fascial appears to be intact.  There is no pain with lateral compression of calcaneus.  There is no edema, erythema.  No pain with Achilles tendon.  No other areas of discomfort identified today.   The right second fourth digit nails are hypertrophic, dystrophic with yellow-brown discoloration.  Tenderness of the 2 toenails.   No pain with calf compression, swelling, warmth, erythema  Assessment: Right heel pain, plantar fasciitis reoccurrence, symptomatic onychomycosis x2  Plan: -All treatment options discussed with the patient including all alternatives, risks, complications.  -Steroid injection performed to the right heel.  The skin was prepped with alcohol and then a mixture of 1 cc Kenalog 10, 1 cc lidocaine plain was infiltrated along the area maximal tenderness on the plantar medial tubercle of the calcaneus without complications.  Postinjection care discussed.  Tolerated the procedure well any complications. -Sharply debrided the nails x2 without any complications or bleeding.  Discussed different  treatment options the nails including different topical antifungal as well as urea nail gel versus vitamin E oil. -Patient encouraged to call the office with any questions, concerns, change in symptoms.   Trula Slade DPM

## 2020-09-15 NOTE — Patient Instructions (Signed)

## 2020-09-26 ENCOUNTER — Ambulatory Visit: Payer: Self-pay | Admitting: General Practice

## 2020-09-26 ENCOUNTER — Encounter (HOSPITAL_BASED_OUTPATIENT_CLINIC_OR_DEPARTMENT_OTHER): Payer: Self-pay | Admitting: Family Medicine

## 2020-09-26 ENCOUNTER — Ambulatory Visit (INDEPENDENT_AMBULATORY_CARE_PROVIDER_SITE_OTHER): Payer: Medicare Other | Admitting: Family Medicine

## 2020-09-26 ENCOUNTER — Other Ambulatory Visit: Payer: Self-pay

## 2020-09-26 VITALS — BP 94/64 | HR 67 | Ht 71.0 in | Wt 207.0 lb

## 2020-09-26 DIAGNOSIS — M546 Pain in thoracic spine: Secondary | ICD-10-CM | POA: Diagnosis not present

## 2020-09-26 DIAGNOSIS — M25552 Pain in left hip: Secondary | ICD-10-CM | POA: Diagnosis not present

## 2020-09-26 DIAGNOSIS — M25551 Pain in right hip: Secondary | ICD-10-CM

## 2020-09-26 DIAGNOSIS — Z7901 Long term (current) use of anticoagulants: Secondary | ICD-10-CM | POA: Diagnosis not present

## 2020-09-26 NOTE — Patient Instructions (Signed)
**Note Rodney-Identified via Obfuscation**   Medication Instructions:  Your physician recommends that you continue on your current medications as directed. Please refer to the Current Medication list given to you today. --If you need a refill on any your medications before your next appointment, please call your pharmacy first. If no refills are authorized on file call the office.--  Lab Work: Your physician has recommended that you have lab work today: INR If you have labs (blood work) drawn today and your tests are completely normal, you will receive your results via [place] a phone call from our staff.  Please ensure you check your voicemail in the event that you authorized detailed messages to be left on a delegated number. If you have any lab test that is abnormal or we need to change your treatment, we will call you to review the results.  Referrals/Procedures/Imaging: A referral has been placed for you to go to Outpatient Physical Therapy at [facility]. Someone from the scheduling department will be in contact with you in regards to coordinating your consultation. If you do not hear from any of the schedulers within 7-10 business days please give our office a call.  [facility] on the 1st Floor Hours of Operation: Monday - Friday 8:00 am - 5:00 pm Closed on weekends and all major holidays (P) [phone]  Follow-Up: Your next appointment:   Your physician recommends that you schedule a follow-up appointment in: 12 WEEKS with Dr. de [name]  Thanks for letting Rodney [name] be apart of your health journey!!  Primary Care and Sports Medicine   Dr. Arlina Robes [name]   We encourage you to activate your patient portal called "MyChart".  Sign up information is provided on this After Visit Summary.  MyChart is used to connect with patients for Virtual Visits (Telemedicine).  Patients are able to view lab/test results, encounter notes, upcoming appointments, etc.   Non-urgent messages can be sent to your provider as well. To learn more about what you can do with MyChart, please visit --  [email or web address].

## 2020-09-26 NOTE — Progress Notes (Signed)
New Patient Office Visit  Subjective:  Patient ID: Rodney [name], male    DOB: 25-Sep-1940  Age: 80 y.o. MRN: [id number]  CC:  Chief Complaint  Patient presents with   Establish Care    Prior PCP Dr. Allen [facility] - Dr. Rushie Chestnut    HPI Rodney [name] is a 80 year old male presenting to establish in clinic.  He is companied by his wife today.  He has current issues related to thoracic back pain, bilateral hip pain.  Patient with past medical history significant for multiple myeloma, hypothyroidism, depression, history of PEs for which he is on chronic anticoagulation with warfarin.  Back pain: Reports history of chronic back pain with prior lumbar back surgery.  About 1 month ago he noticed increase in thoracic back pain, slightly to right side.  Thinks this was related to increased activity.  Patient works at [employer].  He thinks he might have overexerted himself while at work.  Pain has fluctuated some since it initially began.  No specific treatments tried.  Bilateral hip pain: Reports pain is mostly over lateral hip bilaterally.  Does have some radiation down his right leg, no radiation down his left leg.  Does report prior hip injections, although indicates this was done over anterior hip, by history sounds as though he had CSI hip joint.  Anticoagulation: Reports that after having 2 pulmonary embolisms, patient was started on long-term anticoagulation with warfarin.  On review of chart, patient has not had INR checked since March of this year.  Warfarin is written for 5 mg daily, however his last INR visit with his primary provider showed that dosing was 2.5 mg on Monday and Friday with 5 mg administered all of the other days of the week.  Denies any current issues with bleeding or bruising.  Multiple myeloma: Follows with hematology/oncology.  Past Medical History:  Diagnosis Date   Anemia 02-16-11   01-05-11- post  surgery-Transfusions x 4 units   Arthritis    Bone pain 02/18/2013   Cellulitis    Clotting disorder ([place])    Complication of anesthesia 02-16-11   Pt. speaks of awakening during the surgery from back  surgery   Degenerative disc disease 02-16-11   01-05-11 L3 fusion done   Esophageal reflux 05/31/2008   Qualifier: Diagnosis of  By: Arnoldo Morale MD, Balinda Quails    Hernia 02-16-11   left inguinal hernia at present   Hypothyroidism 02/01/2014   Multiple myeloma 02-16-11   suspected tumor  left sacral   Pancreatitis    Personal history of traumatic fracture 07/10/2012   Prostate cancer [facility])    Pulmonary embolism ([place])    Second degree atrioventricular block 11/26/2012   Shortness of breath 02-16-11   hx. Pulmonary emboli x2 (9 yrs/ 3'12 -last)    Past Surgical History:  Procedure Laterality Date   APPENDECTOMY     CATARACT EXTRACTION     CHOLECYSTECTOMY  04/06/2010   laparoscopic-inflammation with stones   INGUINAL HERNIA REPAIR  02/20/2011   Procedure: HERNIA REPAIR INGUINAL ADULT;  Surgeon: Earnstine Regal, MD;  Location: [facility];  Service: General;  Laterality: Left;  Repair Left Inguinal Hernia with Mesh   LUMBAR [place] SURGERY  02-16-11    01-05-11 Lumbar surgery L3(complicated by loss of blood volume)/ 01-09-11 then Lumbar fusion done with retained  hardware    PROSTATE SURGERY Bilateral 2011   [place]  History  Problem Relation Age of Onset   Lymphoma Father 40   Prostate cancer Father    Clotting disorder Mother        blood clots   Deep vein thrombosis Mother    Lung disease Brother    Diabetes Son    Diabetes Daughter    Diabetes Daughter    Colon cancer Neg Hx    Esophageal cancer Neg Hx    Rectal cancer Neg Hx    Stomach cancer Neg Hx     Social History   Socioeconomic History   Marital status: Married    Spouse name: Not on file   Number of children: 3   Years of education: Not on file   Highest education level: Not on file   Occupational History   Occupation: retired  Tobacco Use   Smoking status: Former    Types: Cigarettes    Quit date: 02/15/1969    Years since quitting: 51.6   Smokeless tobacco: Never   Tobacco comments:    quit 50 yrs  Substance and Sexual Activity   Alcohol use: No    Alcohol/week: 0.0 standard drinks   Drug use: No   Sexual activity: Yes  Other Topics Concern   Not on file  Social History Narrative   Married. 3 kids. 4 grandkids   Regular exercise-yes      Working at [employer] 35 hours a week. Considering [employer] again   Social Determinants of Health   Financial Resource Strain: Not on file  Food Insecurity: Not on file  Transportation Needs: Not on file  Physical Activity: Not on file  Stress: Not on file  Social Connections: Not on file  Intimate Partner Violence: Not on file    Objective:   Today's Vitals: BP 94/64   Pulse 67   Ht $R'5\' 11"'bw$  (1.803 m)   Wt 207 lb (93.9 kg)   SpO2 98%   BMI 28.87 kg/m   Physical Exam  80 year old male in no acute distress Cardiovascular exam with regular rate and rhythm, no murmurs appreciated Lungs clear to auscultation bilaterally Thoracic spine: No tenderness to palpation along spinous processes throughout thoracic region, tenderness to palpation through paraspinal muscle along right side of thoracic spine near inferior portion of right scapula. Bilateral hips: Obvious swelling, bruising or erythema: absent Deformity of the shoulder: absent Active ROM: diminished range of motion Passive ROM: diminished range of motion Mild to moderate tenderness palpation over greater trochanter bilaterally Slight pain with internal rotation on right side, no pain with internal rotation on left Neurovascular exam: intact  Assessment & Plan:   Problem List Items Addressed This Visit       Other   Bilateral hip pain    Based on exam and history, suspect greater trochanteric pain syndrome as underlying cause of  symptoms Possible component of osteoarthritis affecting right hip Recommend referral to physical therapy, home exercise program as per PT Could consider steroid injection if not responding to conservative measures Plan for follow-up in about 6 to 8 weeks to monitor progress       Relevant Orders   Ambulatory referral to Physical Therapy   Long term (current) use of anticoagulants    Patient maintained on long-term anticoagulation with warfarin due to history of multiple pulmonary embolisms Has not had INR checked in at least 4 months based on chart review, will check today Dosing of warfarin to be directed based on INR result       Relevant  Orders   INR/PT   Back pain, thoracic - Primary    Suspect spasm of paraspinal muscle contributing to current symptoms Recommend referral to physical therapy for further evaluation and management Can continue with conservative measures with pain control as desired Recommend relative rest, avoiding activities which worsen pain, can gradually increase level of physical activity as symptoms are improving       Relevant Orders   Ambulatory referral to Physical Therapy    Outpatient Encounter Medications as of 09/26/2020  Medication Sig   Biotin 5 MG TBDP Take 1 tablet by mouth daily.   calcium carbonate (TUMS - DOSED IN MG ELEMENTAL CALCIUM) 500 MG chewable tablet Chew 1 tablet by mouth every 2 (two) hours as needed. HEART BURN   Calcium Carbonate-Vitamin D (CALCIUM-D PO) Take by mouth.   cetirizine (ZYRTEC) 10 MG chewable tablet Chew 10 mg by mouth daily.   Cholecalciferol (VITAMIN D) 1000 UNITS capsule Take 4,000 Units by mouth daily.    ciclopirox (PENLAC) 8 % solution Apply topically at bedtime. Apply over nail and surrounding skin. Apply daily over previous coat. After seven (7) days, may remove with alcohol and continue cycle.   citalopram (CELEXA) 10 MG tablet TAKE 1 TABLET BY MOUTH DAILY   Cyanocobalamin (VITAMIN B12 PO) Take by mouth.    diclofenac Sodium (VOLTAREN) 1 % GEL Apply 2 g topically 4 (four) times daily. Rub into affected area of foot 2 to 4 times daily   gabapentin (NEURONTIN) 300 MG capsule TAKE 3 CAPSULES BY MOUTH AT BEDTIME FOR  NERVE  PAIN   levothyroxine (SYNTHROID) 100 MCG tablet TAKE 1 TABLET BY MOUTH DAILY   mirabegron ER (MYRBETRIQ) 25 MG TB24 tablet Take 1 tablet (25 mg total) by mouth daily.   Multiple Vitamins-Minerals (MULTIVITAMIN ADULT PO) Take by mouth.   nitroGLYCERIN (NITRODUR - DOSED IN MG/24 HR) 0.2 mg/hr patch PLACE 1/4 TO 1/2 OF PATCH OVER AFFECTED REGION. REMOVE AND REPLACE ONCE DAILY. SLIGHTLY ALTER SKIN PLACEMENT DAILY   omeprazole (PRILOSEC) 20 MG capsule Take 1 capsule (20 mg total) by mouth 2 (two) times daily.   ondansetron (ZOFRAN) 4 MG tablet TAKE ONE TABLET BY MOUTH EVERY 8 HOURS AS NEEDED FOR NAUSEA OR VOMITING   Potassium Chloride ER 20 MEQ TBCR Take 1 tablet by mouth daily.   tolterodine (DETROL LA) 4 MG 24 hr capsule Take 1 capsule (4 mg total) by mouth daily.   triamcinolone (NASACORT ALLERGY 24HR) 55 MCG/ACT AERO nasal inhaler Place 2 sprays into the nose daily.   warfarin (COUMADIN) 5 MG tablet Take 1 tablet (5 mg total) by mouth daily at 4 PM for 1 dose.   [DISCONTINUED] Coenzyme Q10 (COQ10 PO) Take by mouth. (Patient not taking: Reported on 09/26/2020)   [DISCONTINUED] KLOR-CON M20 20 MEQ tablet    [DISCONTINUED] POTASSIUM PO Take 1 tablet by mouth daily.   [DISCONTINUED] tadalafil (CIALIS) 20 MG tablet Take 1 tablet (20 mg total) by mouth daily as needed for erectile dysfunction. (Patient not taking: No sig reported)   No facility-administered encounter medications on file as of 09/26/2020.   Spent 45 minutes on this patient encounter, including preparation, chart review, face-to-face counseling with patient and coordination of care, and documentation of encounter  Follow-up: Return in about 8 weeks (around 11/21/2020) for Follow Up.   Gurney Balthazor J De [place], MD

## 2020-09-26 NOTE — Assessment & Plan Note (Signed)
Suspect spasm of paraspinal muscle contributing to current symptoms Recommend referral to physical therapy for further evaluation and management Can continue with conservative measures with pain control as desired Recommend relative rest, avoiding activities which worsen pain, can gradually increase level of physical activity as symptoms are improving

## 2020-09-26 NOTE — Assessment & Plan Note (Signed)
Based on exam and history, suspect greater trochanteric pain syndrome as underlying cause of symptoms Possible component of osteoarthritis affecting right hip Recommend referral to physical therapy, home exercise program as per PT Could consider steroid injection if not responding to conservative measures Plan for follow-up in about 6 to 8 weeks to monitor progress

## 2020-09-26 NOTE — Assessment & Plan Note (Signed)
Patient maintained on long-term anticoagulation with warfarin due to history of multiple pulmonary embolisms Has not had INR checked in at least 4 months based on chart review, will check today Dosing of warfarin to be directed based on INR result

## 2020-09-27 ENCOUNTER — Telehealth (HOSPITAL_BASED_OUTPATIENT_CLINIC_OR_DEPARTMENT_OTHER): Payer: Self-pay

## 2020-09-27 LAB — PROTIME-INR
INR: 3.4 — ABNORMAL HIGH (ref 0.9–1.2)
Prothrombin Time: 33.9 s — ABNORMAL HIGH (ref 9.1–12.0)

## 2020-09-27 NOTE — Telephone Encounter (Signed)
-----   Message from Raymond J de [place], MD sent at 09/27/2020  3:51 PM EDT ----- INR slightly above therapeutic range at 3.4.  At this level, would recommend slight decrease in weekly total dose.  Would change to 2.5 mg of warfarin on Monday Wednesday and Friday with 5 mg on all other days of the week.  Recommend repeating INR in 1 week to monitor.

## 2020-09-27 NOTE — Telephone Encounter (Signed)
Pt is aware and agreeable to lab results and recommendations Pt will change warfarin dosing to 2.5 mg Monday, Wednesday, and Friday and 5 mgall other days Pt scheduled for repeat INR on 08/03

## 2020-10-03 ENCOUNTER — Other Ambulatory Visit: Payer: Self-pay

## 2020-10-03 ENCOUNTER — Ambulatory Visit (INDEPENDENT_AMBULATORY_CARE_PROVIDER_SITE_OTHER): Payer: Medicare Other | Admitting: Family Medicine

## 2020-10-03 ENCOUNTER — Telehealth (HOSPITAL_BASED_OUTPATIENT_CLINIC_OR_DEPARTMENT_OTHER): Payer: Self-pay

## 2020-10-03 VITALS — BP 122/81 | HR 64 | Ht 71.0 in | Wt 212.7 lb

## 2020-10-03 DIAGNOSIS — M79605 Pain in left leg: Secondary | ICD-10-CM | POA: Diagnosis not present

## 2020-10-03 DIAGNOSIS — R5383 Other fatigue: Secondary | ICD-10-CM

## 2020-10-03 DIAGNOSIS — M79604 Pain in right leg: Secondary | ICD-10-CM

## 2020-10-03 NOTE — Patient Instructions (Addendum)
  Medication Instructions:  Your physician recommends that you continue on your current medications as directed. Please refer to the Current Medication list given to you today. --If you need a refill on any your medications before your next appointment, please call your pharmacy first. If no refills are authorized on file call the office.--  Lab Work: Your physician has recommended that you have lab work today: CBC, CMP, and TSH If you have labs (blood work) drawn today and your tests are completely normal, you will receive your results via [place] a phone call from our staff.  Please ensure you check your voicemail in the event that you authorized detailed messages to be left on a delegated number. If you have any lab test that is abnormal or we need to change your treatment, we will call you to review the results.  Referrals/Procedures/Imaging: Your physician has requested that you have an ankle brachial index (ABI). During this test an ultrasound and blood pressure cuff are used to evaluate the arteries that supply the arms and legs with blood. Allow thirty minutes for this exam. There are no restrictions or special instructions.  Follow-Up: Your next appointment:   Your physician recommends that you schedule a follow-up appointment TBD  Thanks for letting us be apart of your health journey!!  Primary Care and Sports Medicine   Dr. Arlina Robes [place]   We encourage you to activate your patient portal called "MyChart".  Sign up information is provided on this After Visit Summary.  MyChart is used to connect with patients for Virtual Visits (Telemedicine).  Patients are able to view lab/test results, encounter notes, upcoming appointments, etc.  Non-urgent messages can be sent to your provider as well. To learn more about what you can do with MyChart, please visit --  [email or web address].

## 2020-10-03 NOTE — Progress Notes (Signed)
    Procedures performed today:    None.  Independent interpretation of notes and tests performed by another provider:   None.  Brief History, Exam, Impression, and Recommendations:    BP 122/81   Pulse 64   Ht '5\' 11"'$  (1.803 m)   Wt 212 lb 11.2 oz (96.5 kg)   SpO2 96%   BMI 29.67 kg/m   Fatigue Primarily related to lower extremities.  See below.  Pain in both lower extremities Feels that primarily in his bilateral lower extremities he has been feeling weak/worn out.  First noticed a few months ago but has been more noticeable recently.  Primarily notes symptoms when he is walking uphill or downhill.  When he is walking on flat ground, symptoms are not as bothersome.  He has been noting it when trying to mow his yard in particular.  He denies any chest pain, lightheadedness or dizziness.  Has not had any feelings of presyncope, has not passed out.  Denies any shortness of breath.  Has had labs relatively recently about 6 weeks ago which were mostly normal. On exam, patient is in no acute distress.  Cardiovascular exam with regular rate and rhythm, no murmurs appreciated.  Lungs clear to auscultation bilaterally No significant lower extremity edema, normal strength through bilateral lower extremities, normal distal sensation, posterior tibial pulses present. Uncertain etiology for patient's current symptoms, within differential is hypothyroidism, claudication due to peripheral arterial disease, anemia, electrolyte derangement Check labs as below Will also order for ankle brachial index to be completed Further evaluation and treatment recommendations pending results of above   ___________________________________________ Jelisa Withee de [place], MD, ABFM, CAQSM [facility] [place]

## 2020-10-03 NOTE — Telephone Encounter (Signed)
Patient called in stating that he feel week and fatigued and has a headache Patient states he will go outside to do yard work or to go for a walk and he "feels like Im dragging an anchor behind me" He also states that he has been taking warfarin for 15+ years and he's never taken it the way its currently prescribed based on his las INR Patient is requesting to come into the office to be evaluated.  I Instructed the patient I would forward his request to Dr. de [place] and if he needs to be seen he can be scheduled this afternoon. Patient is aware and agreeable to plan

## 2020-10-04 LAB — CBC WITH DIFFERENTIAL/PLATELET
Basophils Absolute: 0.1 10*3/uL (ref 0.0–0.2)
Basos: 1 %
EOS (ABSOLUTE): 0.1 10*3/uL (ref 0.0–0.4)
Eos: 2 %
Hematocrit: 43 % (ref 37.5–51.0)
Hemoglobin: 14.4 g/dL (ref 13.0–17.7)
Immature Grans (Abs): 0 10*3/uL (ref 0.0–0.1)
Immature Granulocytes: 1 %
Lymphocytes Absolute: 1.2 10*3/uL (ref 0.7–3.1)
Lymphs: 17 %
MCH: 31.6 pg (ref 26.6–33.0)
MCHC: 33.5 g/dL (ref 31.5–35.7)
MCV: 95 fL (ref 79–97)
Monocytes Absolute: 1.2 10*3/uL — ABNORMAL HIGH (ref 0.1–0.9)
Monocytes: 15 %
Neutrophils Absolute: 4.9 10*3/uL (ref 1.4–7.0)
Neutrophils: 64 %
Platelets: 197 10*3/uL (ref 150–450)
RBC: 4.55 x10E6/uL (ref 4.14–5.80)
RDW: 13.4 % (ref 11.6–15.4)
WBC: 7.5 10*3/uL (ref 3.4–10.8)

## 2020-10-04 LAB — COMPREHENSIVE METABOLIC PANEL
ALT: 13 IU/L (ref 0–44)
AST: 21 IU/L (ref 0–40)
Albumin/Globulin Ratio: 1.8 (ref 1.2–2.2)
Albumin: 4.2 g/dL (ref 3.7–4.7)
Alkaline Phosphatase: 122 IU/L — ABNORMAL HIGH (ref 44–121)
BUN/Creatinine Ratio: 20 (ref 10–24)
BUN: 25 mg/dL (ref 8–27)
Bilirubin Total: 0.3 mg/dL (ref 0.0–1.2)
CO2: 20 mmol/L (ref 20–29)
Calcium: 8.7 mg/dL (ref 8.6–10.2)
Chloride: 107 mmol/L — ABNORMAL HIGH (ref 96–106)
Creatinine, Ser: 1.26 mg/dL (ref 0.76–1.27)
Globulin, Total: 2.4 g/dL (ref 1.5–4.5)
Glucose: 89 mg/dL (ref 65–99)
Potassium: 4.6 mmol/L (ref 3.5–5.2)
Sodium: 142 mmol/L (ref 134–144)
Total Protein: 6.6 g/dL (ref 6.0–8.5)
eGFR: 58 mL/min/{1.73_m2} — ABNORMAL LOW (ref >59)

## 2020-10-04 LAB — TSH: TSH: 2.03 u[IU]/mL (ref 0.450–4.500)

## 2020-10-05 ENCOUNTER — Other Ambulatory Visit: Payer: Self-pay

## 2020-10-05 ENCOUNTER — Ambulatory Visit (INDEPENDENT_AMBULATORY_CARE_PROVIDER_SITE_OTHER): Payer: Medicare Other | Admitting: Family Medicine

## 2020-10-05 DIAGNOSIS — M79605 Pain in left leg: Secondary | ICD-10-CM | POA: Insufficient documentation

## 2020-10-05 DIAGNOSIS — Z7901 Long term (current) use of anticoagulants: Secondary | ICD-10-CM

## 2020-10-05 DIAGNOSIS — M79604 Pain in right leg: Secondary | ICD-10-CM | POA: Insufficient documentation

## 2020-10-05 LAB — POCT INR: INR: 1.1 — AB (ref 2.0–3.0)

## 2020-10-05 NOTE — Assessment & Plan Note (Addendum)
Primarily related to lower extremities.  See below.

## 2020-10-05 NOTE — Patient Instructions (Signed)
      Wed 08/03 Thurs 08/04 Fri 08/05 Sat 08/06 Sun 08/07 Mon 08/08       1 tablet   1 tablet    tablet   1 tablet   1 tablet    tablet       Total  5 mg  See below   Total  5 mg   Total  2.5 mg   Total  5 mg   Total  5 mg   Total  2.5 mg                 Wednesday  08/03   Today's INR: 1.1                                Tues 08/09 Wed 08/10         1 tablet    tablet           Total  5 mg   Total  2.5 mg              Instructions  Take 1 tablet today 08/03. Take 1 tablet daily except Monday Wednesday and Friday. Re-check in 1 week.       Follow-Up: Your next appointment:   Your physician recommends that you schedule a follow-up appointment in: 1 WEEK with Dr. de [place]  Thanks for letting us be apart of your health journey!!  Primary Care and Sports Medicine   Dr. Arlina Robes [place]   We encourage you to activate your patient portal called "MyChart".  Sign up information is provided on this After Visit Summary.  MyChart is used to connect with patients for Virtual Visits (Telemedicine).  Patients are able to view lab/test results, encounter notes, upcoming appointments, etc.  Non-urgent messages can be sent to your provider as well. To learn more about what you can do with MyChart, please visit --  [email or web address].

## 2020-10-05 NOTE — Assessment & Plan Note (Signed)
Feels that primarily in his bilateral lower extremities he has been feeling weak/worn out.  First noticed a few months ago but has been more noticeable recently.  Primarily notes symptoms when he is walking uphill or downhill.  When he is walking on flat ground, symptoms are not as bothersome.  He has been noting it when trying to mow his yard in particular.  He denies any chest pain, lightheadedness or dizziness.  Has not had any feelings of presyncope, has not passed out.  Denies any shortness of breath.  Has had labs relatively recently about 6 weeks ago which were mostly normal. On exam, patient is in no acute distress.  Cardiovascular exam with regular rate and rhythm, no murmurs appreciated.  Lungs clear to auscultation bilaterally No significant lower extremity edema, normal strength through bilateral lower extremities, normal distal sensation, posterior tibial pulses present. Uncertain etiology for patient's current symptoms, within differential is hypothyroidism, claudication due to peripheral arterial disease, anemia, electrolyte derangement Check labs as below Will also order for ankle brachial index to be completed Further evaluation and treatment recommendations pending results of above

## 2020-10-10 ENCOUNTER — Other Ambulatory Visit (HOSPITAL_BASED_OUTPATIENT_CLINIC_OR_DEPARTMENT_OTHER): Payer: Self-pay | Admitting: Family Medicine

## 2020-10-10 ENCOUNTER — Ambulatory Visit (HOSPITAL_COMMUNITY)
Admission: RE | Admit: 2020-10-10 | Discharge: 2020-10-10 | Disposition: A | Discharge status: Home / Self Care | Payer: Medicare Other | Source: Ambulatory Visit | Attending: Cardiovascular Disease | Admitting: Cardiovascular Disease

## 2020-10-10 ENCOUNTER — Other Ambulatory Visit: Payer: Self-pay

## 2020-10-10 DIAGNOSIS — M79604 Pain in right leg: Secondary | ICD-10-CM | POA: Diagnosis not present

## 2020-10-10 DIAGNOSIS — R200 Anesthesia of skin: Secondary | ICD-10-CM | POA: Diagnosis not present

## 2020-10-10 DIAGNOSIS — R202 Paresthesia of skin: Secondary | ICD-10-CM | POA: Diagnosis not present

## 2020-10-10 DIAGNOSIS — M79605 Pain in left leg: Secondary | ICD-10-CM | POA: Diagnosis not present

## 2020-10-12 ENCOUNTER — Ambulatory Visit (HOSPITAL_BASED_OUTPATIENT_CLINIC_OR_DEPARTMENT_OTHER): Payer: Medicare Other

## 2020-10-12 ENCOUNTER — Ambulatory Visit (HOSPITAL_BASED_OUTPATIENT_CLINIC_OR_DEPARTMENT_OTHER): Payer: TRICARE For Life (TFL)

## 2020-10-13 ENCOUNTER — Encounter (HOSPITAL_COMMUNITY): Payer: TRICARE For Life (TFL)

## 2020-10-14 ENCOUNTER — Telehealth (HOSPITAL_BASED_OUTPATIENT_CLINIC_OR_DEPARTMENT_OTHER): Payer: Self-pay

## 2020-10-14 DIAGNOSIS — M79605 Pain in left leg: Secondary | ICD-10-CM

## 2020-10-14 DIAGNOSIS — Z7901 Long term (current) use of anticoagulants: Secondary | ICD-10-CM

## 2020-10-14 DIAGNOSIS — M79604 Pain in right leg: Secondary | ICD-10-CM

## 2020-10-14 NOTE — Telephone Encounter (Signed)
Patient is aware and agreeable to study results and recommendations Patient is agreeable to referral to [facility] for evaluation

## 2020-10-14 NOTE — Telephone Encounter (Signed)
-----   Message from Raymond J de [place], MD sent at 10/13/2020 10:28 AM EDT ----- Given findings from blood pressure measurements in lower extremities, would recommend evaluation with vascular surgery.  Findings, particularly in the right lower extremity, are borderline regarding underlying peripheral arterial disease.

## 2020-10-17 ENCOUNTER — Ambulatory Visit (INDEPENDENT_AMBULATORY_CARE_PROVIDER_SITE_OTHER): Payer: Medicare Other | Admitting: Family Medicine

## 2020-10-17 ENCOUNTER — Telehealth: Payer: Self-pay

## 2020-10-17 ENCOUNTER — Encounter (HOSPITAL_BASED_OUTPATIENT_CLINIC_OR_DEPARTMENT_OTHER): Payer: Self-pay

## 2020-10-17 ENCOUNTER — Other Ambulatory Visit: Payer: Self-pay

## 2020-10-17 DIAGNOSIS — Z7901 Long term (current) use of anticoagulants: Secondary | ICD-10-CM | POA: Diagnosis not present

## 2020-10-17 NOTE — Telephone Encounter (Signed)
Based on last interaction with our team/staff I am not willing to accept him again  "Peace, Tammy   TP   10:05 AM Note FYI:   Patient came in this morning to see Jenny Reichmann.    I informed patient that Jenny Reichmann was not in our office today.  I pulled patients appointment desk to see if patient had a standing appointment.  Patient did not.    Patient wanted to know where Jenny Reichmann was today.  I began to pull Cindy's office locations.  Patient got aggravated pretty quickly, turned around and walked out stating he would go to one of the other office locations b/c I was too slow.     Patient later called back asking if there was anywhere he could get his blood checked today.  Before I could respond patient stated he was at [facility] and that they were taking him back.  Then stated he was going to find another provider and thanked me for my assistance and laughed.      "

## 2020-10-17 NOTE — Telephone Encounter (Signed)
Patient's wife called in stating that her husband is wanting to come back to [facility] to see Dr.Hunter. Originally switched due to not being able to have INR checked daily and now un happy with the [place] location. Okay to switch back to you, hasnt been seen since 2020?

## 2020-10-17 NOTE — Progress Notes (Signed)
Pt presents for INR Referral placed to cardiology for Warfarin management

## 2020-10-18 ENCOUNTER — Telehealth (HOSPITAL_BASED_OUTPATIENT_CLINIC_OR_DEPARTMENT_OTHER): Payer: Self-pay

## 2020-10-18 LAB — PROTIME-INR
INR: 1.7 — ABNORMAL HIGH (ref 0.9–1.2)
Prothrombin Time: 17.3 s — ABNORMAL HIGH (ref 9.1–12.0)

## 2020-10-18 NOTE — Progress Notes (Signed)
Pt presents for INR

## 2020-10-18 NOTE — Telephone Encounter (Signed)
Patient was notified and then proceeded to end the call.

## 2020-10-18 NOTE — Telephone Encounter (Signed)
See below, Dr. Yong Channel is NOT willing to accept pt back.

## 2020-10-18 NOTE — Telephone Encounter (Signed)
Called patient to discuss INR Instructed patient to take 0.5 tablet (2.5 mg) on Monday and Friday and 1 tablet ('5mg'$ ) on every other day -- Tuesday, Wednesday, Thursday, Saturday, and Sunday Patient verbalized understanding and recalled back to me instructions Patient is aware and agreeable to repeat INR in 1 week -- scheduled NV for 08/22

## 2020-10-18 NOTE — Telephone Encounter (Signed)
No VM is set up, will try to call back later.

## 2020-10-18 NOTE — Telephone Encounter (Signed)
See below

## 2020-10-18 NOTE — Telephone Encounter (Signed)
-----   Message from Raymond J de [place], MD sent at 10/18/2020 10:15 AM EDT ----- INR slightly below therapeutic range.  Would slightly increase weekly total dose of warfarin.  Recommend new dosing of 2.5 mg on Monday and Friday with 5 mg dose administered all other days of the week.  Repeat INR in 1 week.

## 2020-10-24 ENCOUNTER — Other Ambulatory Visit: Payer: Self-pay

## 2020-10-24 ENCOUNTER — Ambulatory Visit (HOSPITAL_BASED_OUTPATIENT_CLINIC_OR_DEPARTMENT_OTHER): Payer: Medicare Other

## 2020-10-24 ENCOUNTER — Ambulatory Visit (INDEPENDENT_AMBULATORY_CARE_PROVIDER_SITE_OTHER): Payer: Medicare Other | Admitting: Podiatry

## 2020-10-24 ENCOUNTER — Encounter: Payer: Self-pay | Admitting: Podiatry

## 2020-10-24 DIAGNOSIS — Z7901 Long term (current) use of anticoagulants: Secondary | ICD-10-CM

## 2020-10-24 DIAGNOSIS — M722 Plantar fascial fibromatosis: Secondary | ICD-10-CM | POA: Diagnosis not present

## 2020-10-24 DIAGNOSIS — M79674 Pain in right toe(s): Secondary | ICD-10-CM | POA: Diagnosis not present

## 2020-10-24 DIAGNOSIS — B351 Tinea unguium: Secondary | ICD-10-CM | POA: Diagnosis not present

## 2020-10-24 NOTE — Patient Instructions (Signed)

## 2020-10-24 NOTE — Progress Notes (Deleted)
[facility] [place]  ASSESSMENT / PLAN: Rodney [name] is a 80 y.o. male with atherosclerosis of *** native arteries of *** causing {Chronic PAD levels:25303}.  Patient counseled {PAD risks:25304}.  WIfI score calculated based on clinical exam and non-invasive measurements. {WIFIvascular:26096}  Recommend the following which can slow the progression of atherosclerosis and reduce the risk of major adverse cardiac / limb events:  Complete cessation from all tobacco products. Blood glucose control with goal A1c < 7%. Blood pressure control with goal blood pressure < 140/90 mmHg. Lipid reduction therapy with goal LDL-C <100 mg/dL (<70 if symptomatic from PAD).  Aspirin 12m PO QD.  *** Clopidogrel 723mPO QD. *** Rivaroxaban 2.49m67mO BID. *** Cilostozal 100m42m BID for intermittent claudication without evidence of heart failure. Atorvastatin 40-80mg PO QD (or other "high intensity" statin therapy). *** Daily walking to and past the point of discomfort. Patient counseled to keep a log of exercise distance. *** Adequate hydration (at least 2 liters / day) if patient's heart and kidney function is adequate.  Plan *** lower extremity angiogram with possible intervention via *** approach in cath lab ***.    CHIEF COMPLAINT: ***  HISTORY OF PRESENT ILLNESS: Rodney VOELTZa 80 y68. male ***  VASCULAR SURGICAL HISTORY: ***  VASCULAR RISK FACTORS: {FINDINGS; POSITIVE [phone]} history of stroke / transient ischemic attack. {FINDINGS; POSITIVE [phone]} history of coronary artery disease. *** history of PCI. *** history of CABG.  {FINDINGS; POSITIVE [phone]} history of diabetes mellitus. Last A1c ***. {FINDINGS; POSITIVE [phone]} history of smoking. *** actively smoking. {FINDINGS; POSITIVE [phone]} history of hypertension. *** drug regimen with *** control. {FINDINGS; POSITIVE [phone]}  history of chronic kidney disease.  Last GFR ***. CKD {stage:30421363}. {FINDINGS; POSITIVE [phone]} history of chronic obstructive pulmonary disease, treated with ***.  FUNCTIONAL STATUS: ECOG performance status: {findings; ecog performance status:31780} Ambulatory status: {TNHAmbulation:25868}  Past Medical History:  Diagnosis Date   Anemia 02-16-11   01-05-11- post surgery-Transfusions x 4 units   Arthritis    Bone pain 02/18/2013   Cellulitis    Clotting disorder (HCC)[place] Complication of anesthesia 02-16-11   Pt. speaks of awakening during the surgery from back  surgery   Degenerative disc disease 02-16-11   01-05-11 L3 fusion done   Esophageal reflux 05/31/2008   Qualifier: Diagnosis of  By: JenkArnoldo Morale JohnBalinda QuailsHernia 02-16-11   left inguinal hernia at present   Hypothyroidism 02/01/2014   Multiple myeloma 02-16-11   suspected tumor  left sacral   Pancreatitis    Personal history of traumatic fracture 07/10/2012   [facility]monary [facility] ([facility] Second degree atrioventricular block 11/26/2012   Shortness of breath 02-16-11   hx. Pulmonary emboli x2 (9 yrs/ 3'12 -last)    Past Surgical History:  Procedure Laterality Date   APPENDECTOMY     CATARACT EXTRACTION     CHOLECYSTECTOMY  04/06/2010   laparoscopic-inflammation with stones   INGUINAL HERNIA REPAIR  02/20/2011   Procedure: HERNIA REPAIR INGUINAL ADULT;  Surgeon: ToddEarnstine Regal;  Location: [facility];  Service: General;  Laterality: Left;  Repair Left Inguinal Hernia with Mesh   [place]  02-16[place]11    01-05-11 Lumbar surgery L3(complicated by loss of blood volume)/ 01-09-11 then Lumbar fusion done with retained  hardware    PROSTATE SURGERY Bilateral 2011   TONSILLECTOMY AND ADENOIDECTOMY      Family History  Problem Relation Age of Onset   Lymphoma Father 12   Prostate cancer Father    Clotting disorder Mother        blood clots   Deep vein thrombosis Mother    Lung  disease Brother    Diabetes Son    Diabetes Daughter    Diabetes Daughter    Colon cancer Neg Hx    Esophageal cancer Neg Hx    Rectal cancer Neg Hx    Stomach cancer Neg Hx     Social History   Socioeconomic History   Marital status: Married    Spouse name: Not on file   Number of children: 3   Years of education: Not on file   Highest education level: Not on file  Occupational History   Occupation: retired  Tobacco Use   Smoking status: Former    Types: Cigarettes    Quit date: 02/15/1969    Years since quitting: 51.7   Smokeless tobacco: Never   Tobacco comments:    quit 50 yrs  Substance and Sexual Activity   Alcohol use: No    Alcohol/week: 0.0 standard drinks   Drug use: No   Sexual activity: Yes  Other Topics Concern   Not on file  Social History Narrative   Married. 3 kids. 4 grandkids   Regular exercise-yes      Working at [employer] 35 hours a week. Considering [employer] again   Social Determinants of Health   Financial Resource Strain: Not on file  Food Insecurity: Not on file  Transportation Needs: Not on file  Physical Activity: Not on file  Stress: Not on file  Social Connections: Not on file  Intimate Partner Violence: Not on file    Allergies  Allergen Reactions   Blood-Group Specific Substance     Must be Leukocyte Reduced and Irradiated due to stem cell transplant.     Current Outpatient Medications  Medication Sig Dispense Refill   Biotin 5 MG TBDP Take 1 tablet by mouth daily.     calcium carbonate (TUMS - DOSED IN MG ELEMENTAL CALCIUM) 500 MG chewable tablet Chew 1 tablet by mouth every 2 (two) hours as needed. HEART BURN     Calcium Carbonate-Vitamin D (CALCIUM-D PO) Take by mouth.     cetirizine (ZYRTEC) 10 MG chewable tablet Chew 10 mg by mouth daily.     Cholecalciferol (VITAMIN D) 1000 UNITS capsule Take 4,000 Units by mouth daily.      ciclopirox (PENLAC) 8 % solution Apply topically at bedtime. Apply over nail and  surrounding skin. Apply daily over previous coat. After seven (7) days, may remove with alcohol and continue cycle. 6.6 mL 4   citalopram (CELEXA) 10 MG tablet TAKE 1 TABLET BY MOUTH DAILY 90 tablet 0   Cyanocobalamin (VITAMIN B12 PO) Take by mouth.     diclofenac Sodium (VOLTAREN) 1 % GEL Apply 2 g topically 4 (four) times daily. Rub into affected area of foot 2 to 4 times daily 100 g 2   gabapentin (NEURONTIN) 300 MG capsule TAKE 3 CAPSULES BY MOUTH AT BEDTIME FOR  NERVE  PAIN 270 capsule 3   levothyroxine (SYNTHROID) 100 MCG tablet TAKE 1 TABLET BY MOUTH DAILY 90 tablet 3   mirabegron ER (MYRBETRIQ) 25 MG TB24 tablet Take 1 tablet (25 mg total) by mouth daily. 30 tablet 11   Multiple Vitamins-Minerals (MULTIVITAMIN ADULT PO) Take by mouth.     nitroGLYCERIN (NITRODUR - DOSED IN MG/24 HR) 0.2 mg/hr  patch PLACE 1/4 TO 1/2 OF PATCH OVER AFFECTED REGION. REMOVE AND REPLACE ONCE DAILY. SLIGHTLY ALTER SKIN PLACEMENT DAILY 30 patch 0   omeprazole (PRILOSEC) 20 MG capsule Take 1 capsule (20 mg total) by mouth 2 (two) times daily. 180 capsule 2   ondansetron (ZOFRAN) 4 MG tablet TAKE ONE TABLET BY MOUTH EVERY 8 HOURS AS NEEDED FOR NAUSEA OR VOMITING 20 tablet 0   Potassium Chloride ER 20 MEQ TBCR Take 1 tablet by mouth daily.     tolterodine (DETROL LA) 4 MG 24 hr capsule Take 1 capsule (4 mg total) by mouth daily. 30 capsule 6   triamcinolone (NASACORT ALLERGY 24HR) 55 MCG/ACT AERO nasal inhaler Place 2 sprays into the nose daily.     warfarin (COUMADIN) 5 MG tablet Take 1 tablet (5 mg total) by mouth daily at 4 PM for 1 dose. 30 tablet 0   No current facility-administered medications for this visit.    REVIEW OF SYSTEMS:  _0  denotes positive finding, _1  denotes negative finding Cardiac  Comments:  Chest pain or chest pressure: ***   Shortness of breath upon exertion:    Short of breath when lying flat:    Irregular heart rhythm:        Vascular    Pain in calf, thigh, or hip brought on by  ambulation:    Pain in feet at night that wakes you up from your sleep:     Blood clot in your veins:    Leg swelling:         Pulmonary    Oxygen at home:    Productive cough:     Wheezing:         Neurologic    Sudden weakness in arms or legs:     Sudden numbness in arms or legs:     Sudden onset of difficulty speaking or slurred speech:    Temporary loss of vision in one eye:     Problems with dizziness:         Gastrointestinal    Blood in stool:     Vomited blood:         Genitourinary    Burning when urinating:     Blood in urine:        Psychiatric    Major depression:         Hematologic    Bleeding problems:    Problems with blood clotting too easily:        Skin    Rashes or ulcers:        Constitutional    Fever or chills:      PHYSICAL EXAM There were no vitals filed for this visit.  Constitutional: *** appearing. *** distress. Appears *** nourished.  Neurologic: CN ***. *** focal findings. *** sensory loss. Psychiatric: *** Mood and affect symmetric and appropriate. Eyes: *** No icterus. No conjunctival pallor. Ears, nose, throat: *** mucous membranes moist. Midline trachea.  Cardiac: *** rate and rhythm.  Respiratory: *** unlabored. Abdominal: *** soft, non-tender, non-distended.  Peripheral vascular: *** Extremity: *** edema. *** cyanosis. *** pallor.  Skin: *** gangrene. *** ulceration.  Lymphatic: *** Stemmer's sign. *** palpable lymphadenopathy.  PERTINENT LABORATORY AND RADIOLOGIC DATA  Most recent CBC CBC Latest Ref Rng & Units 10/03/2020 05/18/2019 09/25/2018  WBC 3.4 - 10.8 x10E3/uL 7.5 5.8 6.1  Hemoglobin 13.0 - 17.7 g/dL 14.4 15.0 13.9  Hematocrit 37.5 - 51.0 % 43.0 45 40.5  Platelets 150 - 450 x10E3/uL 197  201 184.0     Most recent CMP CMP Latest Ref Rng & Units 10/03/2020 05/18/2019 09/25/2018  Glucose 65 - 99 mg/dL 89 - 81  BUN 8 - 27 mg/dL 25 22(A) 22  Creatinine 0.76 - 1.27 mg/dL 1.26 1.3 1.26  Sodium 134 - 144 mmol/L 142 138  136  Potassium 3.5 - 5.2 mmol/L 4.6 4.9 4.8  Chloride 96 - 106 mmol/L 107(H) 107 103  CO2 20 - 29 mmol/L 20 26(A) 27  Calcium 8.6 - 10.2 mg/dL 8.7 8.9 9.0  Total Protein 6.0 - 8.5 g/dL 6.6 - 6.7  Total Bilirubin 0.0 - 1.2 mg/dL 0.3 - 0.5  Alkaline Phos 44 - 121 IU/L 122(H) 96 94  AST 0 - 40 IU/L _0 ALT 0 - 44 IU/L _1 Renal function CrCl cannot be calculated (Patient's most recent lab result is older than the maximum 21 days allowed.).  No results found for: HGBA1C  LDL Cholesterol  Date Value Ref Range Status  02/17/2016 44 0 - 99 mg/dL Final   Direct LDL  Date Value Ref Range Status  09/25/2018 76.0 mg/dL Final    Comment:    Optimal:  <100 mg/dLNear or Above Optimal:  100-129 mg/dLBorderline High:  130-159 mg/dLHigh:  160-189 mg/dLVery High:  >190 mg/dL     Vascular Imaging: ***  Corretta Munce N. Stanford Breed, MD [facility] Phone Number: [phone] 10/24/2020 4:22 PM  Total time spent on preparing this encounter including chart review, data review, collecting history, examining the patient, coordinating care for this {tnhtimebilling:26202}  Portions of this report may have been transcribed using voice recognition software.  Every effort has been made to ensure accuracy; however, inadvertent computerized transcription errors may still be present.

## 2020-10-25 ENCOUNTER — Encounter: Payer: Medicare Other | Admitting: Vascular Surgery

## 2020-10-26 ENCOUNTER — Encounter (HOSPITAL_BASED_OUTPATIENT_CLINIC_OR_DEPARTMENT_OTHER): Payer: Self-pay

## 2020-10-26 ENCOUNTER — Other Ambulatory Visit: Payer: Self-pay | Admitting: Family Medicine

## 2020-10-26 NOTE — Progress Notes (Signed)
Patient was scheduled for INR recheck on 08/22 Patient did not show up for visit Patient was informed and educated on importance of keeping INR levels closely monitored.  This is the second time patient has not kept scheduled INR visit  Dr. de [place] is aware that patient is non compliant with appointments

## 2020-10-28 ENCOUNTER — Other Ambulatory Visit: Payer: Medicare Other

## 2020-10-31 NOTE — Progress Notes (Signed)
Subjective: 80 year old male presents the office today for follow evaluation of plantar fasciitis on the right side.  He states the injection helped and the pain almost completely went away however it started to come back some.  He states that it hurts more depending on his activity.  Also asking the nails be trimmed states of thickened elongated and get ingrown.  No swelling redness or any drainage the toenail sites.  He has no other concerns today.   Objective: AAO x3, NAD DP/PT pulses palpable bilaterally, CRT less than 3 seconds There is mild tenderness palpation on the plantar medial tubercle of the calcaneus at the insertion of plantar fashion the left side.  Plantar pressure appears to be intact.  There is no pain with lateral compression of calcaneus.  No other area discomfort.  MMT 5/5.   Nails are hypertrophic, dystrophic, brittle, discolored, elongated 10. No surrounding redness or drainage. Tenderness nails 1-5 bilaterally. No open lesions or pre-ulcerative lesions are identified today. No pain with calf compression, swelling, warmth, erythema  Assessment: Right heel pain, plantar fasciitis; symptomatic onychomycosis  Plan: -All treatment options discussed with the patient including all alternatives, risks, complications.  -Regards to the right heel pain he also hold off on further steroid injection.  Encouraged stretching, icing daily as well as wearing shoes and good arch supports.  He has power step inserts inside of his shoes. -Sharply debrided the nails x10 without any complications or bleeding. -Patient encouraged to call the office with any questions, concerns, change in symptoms.   Trula Slade DPM

## 2020-11-01 ENCOUNTER — Other Ambulatory Visit (HOSPITAL_BASED_OUTPATIENT_CLINIC_OR_DEPARTMENT_OTHER): Payer: Self-pay

## 2020-11-01 ENCOUNTER — Other Ambulatory Visit: Payer: Self-pay

## 2020-11-01 ENCOUNTER — Other Ambulatory Visit: Payer: Medicare Other

## 2020-11-01 DIAGNOSIS — C61 Malignant neoplasm of prostate: Secondary | ICD-10-CM

## 2020-11-01 MED ORDER — CITALOPRAM HYDROBROMIDE 10 MG PO TABS
10.0000 mg | ORAL_TABLET | Freq: Every day | ORAL | 0 refills | Status: DC
Start: 2020-11-01 — End: 2020-11-24

## 2020-11-01 NOTE — Telephone Encounter (Signed)
Patients wife called on behalf of patient requesting to have Citalopram 10 mg sent to pharmacy Patient is schedule to follow up on 09/19 Will authorize refill until follow up is complete

## 2020-11-02 LAB — PSA: Prostate Specific Ag, Serum: <0.1 ng/mL (ref 0.0–4.0)

## 2020-11-04 ENCOUNTER — Ambulatory Visit: Payer: Medicare Other | Admitting: Urology

## 2020-11-08 ENCOUNTER — Other Ambulatory Visit: Payer: Self-pay

## 2020-11-08 ENCOUNTER — Encounter: Payer: Self-pay | Admitting: Urology

## 2020-11-08 ENCOUNTER — Telehealth (INDEPENDENT_AMBULATORY_CARE_PROVIDER_SITE_OTHER): Payer: Medicare Other | Admitting: Urology

## 2020-11-08 DIAGNOSIS — N3281 Overactive bladder: Secondary | ICD-10-CM | POA: Diagnosis not present

## 2020-11-08 DIAGNOSIS — N5201 Erectile dysfunction due to arterial insufficiency: Secondary | ICD-10-CM

## 2020-11-08 DIAGNOSIS — C61 Malignant neoplasm of prostate: Secondary | ICD-10-CM | POA: Diagnosis not present

## 2020-11-08 MED ORDER — MIRABEGRON ER 25 MG PO TB24: 25.0000 mg | ORAL_TABLET | Freq: Every day | ORAL | 11 refills | Status: DC

## 2020-11-08 MED ORDER — TOLTERODINE TARTRATE ER 4 MG PO CP24: 4.0000 mg | ORAL_CAPSULE | Freq: Every day | ORAL | 11 refills | Status: DC

## 2020-11-08 NOTE — Progress Notes (Signed)
Results printed and mailed.   

## 2020-11-08 NOTE — Patient Instructions (Signed)

## 2020-11-08 NOTE — Progress Notes (Signed)
11/08/2020 1:08 PM   Rodney [name] August 28, 1940 [id number]  Referring provider: Marin Olp, MD [address] [place] [address] [place],  [place] [address]  Patient location: home Physician location: office I connected with  Rodney [name] on 11/08/20 by a video enabled telemedicine application and verified that I am speaking with the correct person using two identifiers.   I discussed the limitations of evaluation and management by telemedicine. The patient expressed understanding and agreed to proceed.    Followup prostate cancer, OAB and erectile dysfunction  HPI: Rodney [name] is a 80yo here for followup for prostate cancer, erectile dysfunction and OAB. PSA remains undetectable. He has nocturia 1-2x. He has occasional urinary urgency and occasional urinary dribbling. The patient is on mirabegron 68m and detrol 463mdaily. No other complaints today. Patient is currently not using the tadalafil since it is expensive. He is not interested in further therapy.    PMH: Past Medical History:  Diagnosis Date   Anemia 02-16-11   01-05-11- post surgery-Transfusions x 4 units   Arthritis    Bone pain 02/18/2013   Cellulitis    [facility] disorder ([facility]   Complication of anesthesia 02-16-11   Pt. speaks of awakening during the surgery from back  surgery   Degenerative disc disease 02-16-11   01-05-11 L3 fusion done   Esophageal reflux 05/31/2008   Qualifier: Diagnosis of  By: JeArnoldo MoraleD, JoBalinda Quails  Hernia 02-16-11   left inguinal hernia at present   Hypothyroidism 02/01/2014   Multiple myeloma 02-16-11   suspected tumor  left sacral   Pancreatitis    Personal history of traumatic fracture 07/10/2012   [facility]   [place]   Second degree atrioventricular block 11/26/2012   Shortness of breath 02-16-11   hx. Pulmonary emboli x2 (9 yrs/ 3'12 -last)    Surgical History: Past Surgical History:  Procedure Laterality Date   APPENDECTOMY     CATARACT  EXTRACTION     CHOLECYSTECTOMY  04/06/2010   laparoscopic-inflammation with stones   INGUINAL HERNIA REPAIR  02/20/2011   Procedure: HERNIA REPAIR INGUINAL ADULT;  Surgeon: ToEarnstine RegalMD;  Location: [facility];  Service: General;  Laterality: Left;  Repair Left Inguinal Hernia with Mesh   LUMBAR [place]  02-16-11    01-05-11 Lumbar surgery L3(complicated by loss of blood volume)/ 01-09-11 then Lumbar fusion done with retained  hardware    PROSTATE SURGERY Bilateral 2011   TONSILLECTOMY AND ADENOIDECTOMY      Home Medications:  Allergies as of 11/08/2020       Reactions   Blood-group Specific Substance    Must be Leukocyte Reduced and Irradiated due to stem cell transplant.         Medication List        Accurate as of November 08, 2020  1:08 PM. If you have any questions, ask your nurse or doctor.          Biotin 5 MG Tbdp Take 1 tablet by mouth daily.   calcium carbonate 500 MG chewable tablet Commonly known as: TUMS - dosed in mg elemental calcium Chew 1 tablet by mouth every 2 (two) hours as needed. HEART BURN   CALCIUM-D PO Take by mouth.   cetirizine 10 MG chewable tablet Commonly known as: ZYRTEC Chew 10 mg by mouth daily.   ciclopirox 8 % solution Commonly known as: Penlac Apply topically at bedtime. Apply over nail and surrounding skin. Apply daily  over previous coat. After seven (7) days, may remove with alcohol and continue cycle.   citalopram 10 MG tablet Commonly known as: CELEXA Take 1 tablet (10 mg total) by mouth daily.   diclofenac Sodium 1 % Gel Commonly known as: VOLTAREN Apply 2 g topically 4 (four) times daily. Rub into affected area of foot 2 to 4 times daily   gabapentin 300 MG capsule Commonly known as: NEURONTIN TAKE 3 CAPSULES BY MOUTH AT BEDTIME FOR  NERVE  PAIN   levothyroxine 100 MCG tablet Commonly known as: SYNTHROID TAKE 1 TABLET BY MOUTH DAILY   mirabegron ER 25 MG Tb24 tablet Commonly known as: Myrbetriq Take 1 tablet  (25 mg total) by mouth daily.   MULTIVITAMIN ADULT PO Take by mouth.   Nasacort Allergy 24HR 55 MCG/ACT Aero nasal inhaler Generic drug: triamcinolone Place 2 sprays into the nose daily.   nitroGLYCERIN 0.2 mg/hr patch Commonly known as: NITRODUR - Dosed in mg/24 hr PLACE 1/4 TO 1/2 OF PATCH OVER AFFECTED REGION. REMOVE AND REPLACE ONCE DAILY. SLIGHTLY ALTER SKIN PLACEMENT DAILY   omeprazole 20 MG capsule Commonly known as: PRILOSEC Take 1 capsule (20 mg total) by mouth 2 (two) times daily.   ondansetron 4 MG tablet Commonly known as: ZOFRAN TAKE ONE TABLET BY MOUTH EVERY 8 HOURS AS NEEDED FOR NAUSEA OR VOMITING   Potassium Chloride ER 20 MEQ Tbcr Take 1 tablet by mouth daily.   tolterodine 4 MG 24 hr capsule Commonly known as: DETROL LA Take 1 capsule (4 mg total) by mouth daily.   VITAMIN B12 PO Take by mouth.   Vitamin D 1000 units capsule Take 4,000 Units by mouth daily.   warfarin 5 MG tablet Commonly known as: COUMADIN Take 1 tablet (5 mg total) by mouth daily at 4 PM for 1 dose.        Allergies:  Allergies  Allergen Reactions   Blood-Group Specific Substance     Must be Leukocyte Reduced and Irradiated due to stem cell transplant.     Family History: Family History  Problem Relation Age of Onset   Lymphoma Father 1   Prostate cancer Father    Clotting disorder Mother        blood clots   Deep vein thrombosis Mother    Lung disease Brother    Diabetes Son    Diabetes Daughter    Diabetes Daughter    Colon cancer Neg Hx    Esophageal cancer Neg Hx    Rectal cancer Neg Hx    Stomach cancer Neg Hx     Social History:  reports that he quit smoking about 51 years ago. His smoking use included cigarettes. He has never used smokeless tobacco. He reports that he does not drink alcohol and does not use drugs.  ROS: All other review of systems were reviewed and are negative except what is noted above in HPI   Laboratory Data: Lab Results   Component Value Date   WBC 7.5 10/03/2020   HGB 14.4 10/03/2020   HCT 43.0 10/03/2020   MCV 95 10/03/2020   PLT 197 10/03/2020    Lab Results  Component Value Date   CREATININE 1.26 10/03/2020    Lab Results  Component Value Date   PSA 0.00 (L) 09/25/2018   PSA 0.00 Repeated and verified X2. (L) 09/24/2016   PSA 0.00 Repeated and verified X2. (L) 02/17/2016    No results found for: TESTOSTERONE  No results found for: HGBA1C  Urinalysis  Component Value Date/Time   COLORURINE YELLOW 09/24/2016 1212   APPEARANCEUR Clear 11/06/2019 1336   LABSPEC 1.020 09/24/2016 1212   PHURINE 5.5 09/24/2016 1212   GLUCOSEU Negative 11/06/2019 1336   GLUCOSEU NEGATIVE 09/24/2016 1212   HGBUR TRACE-INTACT (A) 09/24/2016 1212   BILIRUBINUR Negative 11/06/2019 1336   KETONESUR NEGATIVE 09/24/2016 1212   PROTEINUR Negative 11/06/2019 1336   PROTEINUR 30 (A) 05/19/2011 1905   UROBILINOGEN 0.2 09/24/2016 1212   NITRITE Negative 11/06/2019 1336   NITRITE NEGATIVE 09/24/2016 1212   LEUKOCYTESUR Negative 11/06/2019 1336    Lab Results  Component Value Date   LABMICR See below: 11/06/2019   WBCUA None seen 11/06/2019   LABEPIT None seen 11/06/2019   MUCUS Presence of (A) 09/24/2016   BACTERIA None seen 11/06/2019    Pertinent Imaging:  No results found for this or any previous visit.  No results found for this or any previous visit.  No results found for this or any previous visit.  No results found for this or any previous visit.  No results found for this or any previous visit.  No results found for this or any previous visit.  No results found for this or any previous visit.  No results found for this or any previous visit.   Assessment & Plan:    1. Prostate cancer ([place]) RTC 1 year with PSA  2. OAB (overactive bladder) Continue mirabegron 2m and detrol 428mdaily  3. Erectile dysfunction due to arterial insufficiency Patient defers therapy at this  time   No follow-ups on file.  PaNicolette BangMD  [facility]

## 2020-11-10 ENCOUNTER — Telehealth (HOSPITAL_BASED_OUTPATIENT_CLINIC_OR_DEPARTMENT_OTHER): Payer: Self-pay

## 2020-11-10 ENCOUNTER — Other Ambulatory Visit: Payer: Self-pay

## 2020-11-10 ENCOUNTER — Ambulatory Visit (INDEPENDENT_AMBULATORY_CARE_PROVIDER_SITE_OTHER): Payer: Medicare Other | Admitting: Family Medicine

## 2020-11-10 DIAGNOSIS — Z7901 Long term (current) use of anticoagulants: Secondary | ICD-10-CM

## 2020-11-10 LAB — POCT INR: INR: 2.7 (ref 2.0–3.0)

## 2020-11-10 MED ORDER — WARFARIN SODIUM 5 MG PO TABS
ORAL_TABLET | ORAL | 0 refills | Status: DC
Start: 2020-11-10 — End: 2020-12-13

## 2020-11-10 NOTE — Patient Instructions (Signed)
  Medication Instructions:  Your physician recommends that you continue on your current medications as directed. Please refer to the Current Medication list given to you today. --If you need a refill on any your medications before your next appointment, please call your pharmacy first. If no refills are authorized on file call the office.-- Follow-Up: Your next appointment:   Your physician recommends that you schedule a follow-up appointment in: 2 WEEKS with Dr. de [place]  Thanks for letting us be apart of your health journey!!  Primary Care and Sports Medicine   Dr. Arlina Robes [place]                   Thurs 09/08 Fri 09/09 Sat 09/10 Sun 09/11 Mon 09/12 Tues 09/13 Wed 09/14    1 tablet   0.5 tablet   1 tablet   1 tablet   0.5 tablet   1 tablet   1 tablet      Total  5 mg  See below   Total  2.5 mg   Total  2.5 mg   Total  5 mg   Total  2.5 mg   Total  5 mg   Total  5 mg              Today's INR:  2.7                 Thurs 09/15 Fri 09/16 Sat 09/17 Sun 09/18 Mon 09/19 Tues 09/20 Wed 09/21    1 tablet   0.5 tablet     1 tablet     1 tablet     0.5 tablet     1 tablet   1 tablet       Total  5 mg   Total  2.5 mg     Total  5 mg     Total  5 mg   Total  2.5 mg    Total  5 mg    Total  5 mg           Thurs 09/22  1 tablet    Total  5 mg     Instructions   Take 1 tablet today 09/08. Take 1 tablet daily except Monday and Friday, take 0.5 Tablet. Re-check in 2 week.

## 2020-11-10 NOTE — Telephone Encounter (Signed)
Received a voicemail from patient regarding warfarin refill Patient no showed 08/22 INR check Called patient to inform him that he cannot get warfarin refilled until he has INR checked. Patient is agreeable to coming in today at 1:00 pm for INR check

## 2020-11-10 NOTE — Progress Notes (Signed)
Patient presents for INR recheck INR measured at 2.7 within therapeutic range -- discussed with Dr. de [place]  Patient instructed to continue current dose of Warfarin at 0.5 tab on Monday and Friday and 1 tab every other day Patient instructed to return in 2 weeks for repeat INR Patient voiced understanding and refill sent to pharmacy.

## 2020-11-21 ENCOUNTER — Ambulatory Visit (INDEPENDENT_AMBULATORY_CARE_PROVIDER_SITE_OTHER): Payer: Medicare Other | Admitting: Family Medicine

## 2020-11-21 ENCOUNTER — Encounter (HOSPITAL_BASED_OUTPATIENT_CLINIC_OR_DEPARTMENT_OTHER): Payer: Self-pay | Admitting: Family Medicine

## 2020-11-21 ENCOUNTER — Other Ambulatory Visit: Payer: Self-pay

## 2020-11-21 VITALS — BP 104/72 | HR 74 | Ht 71.0 in | Wt 212.3 lb

## 2020-11-21 DIAGNOSIS — M5441 Lumbago with sciatica, right side: Secondary | ICD-10-CM

## 2020-11-21 DIAGNOSIS — M25552 Pain in left hip: Secondary | ICD-10-CM | POA: Insufficient documentation

## 2020-11-21 DIAGNOSIS — M542 Cervicalgia: Secondary | ICD-10-CM | POA: Diagnosis not present

## 2020-11-21 DIAGNOSIS — G8929 Other chronic pain: Secondary | ICD-10-CM | POA: Diagnosis not present

## 2020-11-21 NOTE — Assessment & Plan Note (Signed)
Long history of chronic low back pain with prior surgery about 15 years ago.  Pain is primarily in the low back bilaterally.  Will have some radiation into the hips.  Radiation on right will go down the leg.  Has been referred to physical therapy in the past, however has not completed any PT recently Patient with tenderness over paraspinal muscles bilaterally and lower back.  No tenderness over spinous processes.  No red flags on exam or history.  Neurovascularly intact distally in bilateral lower extremities. Recommended initial referral to physical therapy, home exercise program as per PT If pain is persisting, consider referral to spine specialist given history of prior lumbar surgery.

## 2020-11-21 NOTE — Patient Instructions (Signed)
  Medication Instructions:  Your physician recommends that you continue on your current medications as directed. Please refer to the Current Medication list given to you today. --If you need a refill on any your medications before your next appointment, please call your pharmacy first. If no refills are authorized on file call the office.--  Referrals/Procedures/Imaging: A referral has been placed for you to go to Outpatient Physical Therapy at [facility]. Someone from the scheduling department will be in contact with you in regards to coordinating your consultation. If you do not hear from any of the schedulers within 7-10 business days please give our office a call.  [place] at [facility] on the 1st Floor Hours of Operation: Monday - Friday 8:00 am - 5:00 pm Closed on weekends and all major holidays [phone]   Follow-Up: Your next appointment:   Your physician recommends that you schedule a follow-up appointment in: 6-8 WEEKS with Dr. de [place]  Thanks for letting us be apart of your health journey!!  Primary Care and Sports Medicine   Dr. Arlina Robes [place]   We encourage you to activate your patient portal called "MyChart".  Sign up information is provided on this After Visit Summary.  MyChart is used to connect with patients for Virtual Visits (Telemedicine).  Patients are able to view lab/test results, encounter notes, upcoming appointments, etc.  Non-urgent messages can be sent to your provider as well. To learn more about what you can do with MyChart, please visit --  [email or web address].

## 2020-11-21 NOTE — Assessment & Plan Note (Signed)
Patient continues to have bilateral hip pain.  Left hip pain seems most consistent with greater trochanteric pain syndrome, similar to prior evaluation in the office Patient previously was referred to physical therapy, has not arranged as of yet Referral again placed today, discussed treatment recommendations including physical therapy, home exercise program Plan to follow-up regarding this in about 6 to 8 weeks or sooner as needed

## 2020-11-21 NOTE — Assessment & Plan Note (Addendum)
Has had some neck pain for about 3 weeks, thinks that he slept wrong which led to his current issues Pain is mostly over left side of neck, will extend from base of skull down to left shoulder Denies any symptoms in his arms, no associated numbness or tingling Has tried a heating pad occasionally with some relief On exam, patient with tenderness to palpation extending from base of skull through trapezius down to left shoulder.  Neurovascular intact in bilateral upper extremities. Recommend proceeding with initial conservative treatment with referral to physical therapy, home exercise program as per PT Plan for follow-up in about 6 to 8 weeks to monitor progress. If not improving as expected, consider x-rays for initial evaluation.

## 2020-11-21 NOTE — Progress Notes (Signed)
    Procedures performed today:    None.  Independent interpretation of notes and tests performed by another provider:   None.  Brief History, Exam, Impression, and Recommendations:    BP 104/72   Pulse 74   Ht '5\' 11"'$  (1.803 m)   Wt 212 lb 4.8 oz (96.3 kg)   SpO2 96%   BMI 29.61 kg/m   Greater trochanteric pain syndrome of left lower extremity Patient continues to have bilateral hip pain.  Left hip pain seems most consistent with greater trochanteric pain syndrome, similar to prior evaluation in the office Patient previously was referred to physical therapy, has not arranged as of yet Referral again placed today, discussed treatment recommendations including physical therapy, home exercise program Plan to follow-up regarding this in about 6 to 8 weeks or sooner as needed  Low back pain Long history of chronic low back pain with prior surgery about 15 years ago.  Pain is primarily in the low back bilaterally.  Will have some radiation into the hips.  Radiation on right will go down the leg.  Has been referred to physical therapy in the past, however has not completed any PT recently Patient with tenderness over paraspinal muscles bilaterally and lower back.  No tenderness over spinous processes.  No red flags on exam or history.  Neurovascularly intact distally in bilateral lower extremities. Recommended initial referral to physical therapy, home exercise program as per PT If pain is persisting, consider referral to spine specialist given history of prior lumbar surgery.  Neck pain Has had some neck pain for about 3 weeks, thinks that he slept wrong which led to his current issues Pain is mostly over left side of neck, will extend from base of skull down to left shoulder Denies any symptoms in his arms, no associated numbness or tingling Has tried a heating pad occasionally with some relief On exam, patient with tenderness to palpation extending from base of skull through  trapezius down to left shoulder.  Neurovascular intact in bilateral upper extremities. Recommend proceeding with initial conservative treatment with referral to physical therapy, home exercise program as per PT Plan for follow-up in about 6 to 8 weeks to monitor progress. If not improving as expected, consider x-rays for initial evaluation.  Patient with right foot pain, has been following up with podiatry, has follow-up scheduled for 1 week.  Recommend discussing further during office visit with podiatrist He also continues to have some fatigue in bilateral lower extremities with prolonged walking.  Has had ABI completed previously.  Is scheduled to see vascular surgeon later this month, encouraged to continue with appointment.  Spent 30 minutes on this patient encounter, including preparation, chart review, face-to-face counseling with patient and coordination of care, and documentation of encounter  Plan for follow-up about 6 to 8 weeks to monitor above   ___________________________________________ Brylynn Hanssen de [place], MD, ABFM, CAQSM Primary Care and [place]

## 2020-11-24 ENCOUNTER — Encounter (HOSPITAL_BASED_OUTPATIENT_CLINIC_OR_DEPARTMENT_OTHER): Payer: Self-pay

## 2020-11-24 ENCOUNTER — Other Ambulatory Visit (HOSPITAL_BASED_OUTPATIENT_CLINIC_OR_DEPARTMENT_OTHER): Payer: Self-pay | Admitting: Family Medicine

## 2020-11-24 ENCOUNTER — Ambulatory Visit (HOSPITAL_BASED_OUTPATIENT_CLINIC_OR_DEPARTMENT_OTHER): Payer: Medicare Other | Admitting: Nurse Practitioner

## 2020-11-25 ENCOUNTER — Ambulatory Visit (HOSPITAL_BASED_OUTPATIENT_CLINIC_OR_DEPARTMENT_OTHER): Payer: Medicare Other

## 2020-11-25 ENCOUNTER — Other Ambulatory Visit: Payer: Self-pay

## 2020-11-25 DIAGNOSIS — Z7901 Long term (current) use of anticoagulants: Secondary | ICD-10-CM | POA: Diagnosis not present

## 2020-11-26 LAB — PROTIME-INR
INR: 2.5 — ABNORMAL HIGH (ref 0.9–1.2)
Prothrombin Time: 25.3 s — ABNORMAL HIGH (ref 9.1–12.0)

## 2020-11-27 ENCOUNTER — Other Ambulatory Visit (HOSPITAL_BASED_OUTPATIENT_CLINIC_OR_DEPARTMENT_OTHER): Payer: Self-pay | Admitting: Family Medicine

## 2020-11-28 ENCOUNTER — Telehealth (HOSPITAL_BASED_OUTPATIENT_CLINIC_OR_DEPARTMENT_OTHER): Payer: Self-pay

## 2020-11-28 DIAGNOSIS — C9001 Multiple myeloma in remission: Secondary | ICD-10-CM | POA: Diagnosis not present

## 2020-11-28 DIAGNOSIS — Z0000 Encounter for general adult medical examination without abnormal findings: Secondary | ICD-10-CM | POA: Diagnosis not present

## 2020-11-28 DIAGNOSIS — C9000 Multiple myeloma not having achieved remission: Secondary | ICD-10-CM | POA: Diagnosis not present

## 2020-11-28 DIAGNOSIS — R6889 Other general symptoms and signs: Secondary | ICD-10-CM | POA: Diagnosis not present

## 2020-11-28 NOTE — Telephone Encounter (Signed)
Patient is aware and agreeable to lab results and recommendations Patient is agreeable to repeat INR in 4 weeks

## 2020-11-28 NOTE — Telephone Encounter (Signed)
-----   Message from Raymond J de [place], MD sent at 11/28/2020  8:23 AM EDT ----- INR is at goal.  Continue with same dosing of warfarin.  Recommend next INR be completed in about 4 weeks.

## 2020-11-30 ENCOUNTER — Encounter: Payer: Medicare Other | Admitting: Vascular Surgery

## 2020-12-04 NOTE — Therapy (Addendum)
OUTPATIENT PHYSICAL THERAPY THORACOLUMBAR EVALUATION   Patient Name: Rodney [name] MRN: [id number] DOB:Dec 05, 1940, 80 y.o., male Today's Date: 12/06/2020   PT End of Session - 12/05/20 2354     Visit Number 1    Number of Visits 12    Date for PT Re-Evaluation 01/16/21    Authorization Type UHC Medicare Tricare    PT Start Time 1533    PT Stop Time 1640    PT Time Calculation (min) 67 min    Activity Tolerance Patient tolerated treatment well    Behavior During Therapy [facility] for tasks assessed/performed             Past Medical History:  Diagnosis Date   Anemia 02-16-11   01-05-11- post surgery-Transfusions x 4 units   Arthritis    Bone pain 02/18/2013   Cellulitis    Clotting disorder ([place])    Complication of anesthesia 02-16-11   Pt. speaks of awakening during the surgery from back  surgery   Degenerative disc disease 02-16-11   01-05-11 L3 fusion done   Esophageal reflux 05/31/2008   Qualifier: Diagnosis of  By: Arnoldo Morale MD, Balinda Quails    Hernia 02-16-11   left inguinal hernia at present   Hypothyroidism 02/01/2014   Multiple myeloma 02-16-11   suspected tumor  left sacral   Pancreatitis    Personal history of traumatic fracture 07/10/2012   Prostate cancer ([place])    Pulmonary embolism ([place])    Second degree atrioventricular block 11/26/2012   Shortness of breath 02-16-11   hx. Pulmonary emboli x2 (9 yrs/ 3'12 -last)   Past Surgical History:  Procedure Laterality Date   APPENDECTOMY     CATARACT EXTRACTION     CHOLECYSTECTOMY  04/06/2010   laparoscopic-inflammation with stones   INGUINAL HERNIA REPAIR  02/20/2011   Procedure: HERNIA REPAIR INGUINAL ADULT;  Surgeon: Earnstine Regal, MD;  Location: [facility];  Service: General;  Laterality: Left;  Repair Left Inguinal Hernia with Mesh   LUMBAR [place] SURGERY  02-16-11    01-05-11 Lumbar surgery L3(complicated by loss of blood volume)/ 01-09-11 then Lumbar fusion done with retained  hardware    PROSTATE SURGERY Bilateral  2011   [place]     Patient Active Problem List   Diagnosis Date Noted   Greater trochanteric pain syndrome of left lower extremity 11/21/2020   Neck pain 11/21/2020   Pain in both lower extremities 10/05/2020   Close exposure to COVID-19 virus 03/21/2020   Prostate cancer ([place]) 11/06/2019   Erectile dysfunction due to arterial insufficiency 11/06/2019   Hyperlipidemia, unspecified 04/06/2019   Stem cells transplant status ([place]) 09/25/2018   Tear of left gluteus medius tendon 11/08/2017   Primary osteoarthritis of both hips 11/08/2017   Vitamin D deficiency 04/09/2017   OAB (overactive bladder) 04/09/2017   History of skin cancer 04/09/2017   Back pain, thoracic 12/19/2016   Long term (current) use of anticoagulants 12/14/2016   Recurrent major depression in full remission ([place]) 10/02/2016   Fatigue 09/24/2016   Lump of skin 04/23/2016   Bilateral hip pain 04/06/2016   Carpal tunnel syndrome on both sides 01/24/2016   Right foot pain 10/05/2015   Tendonitis of ankle, left 10/03/2015   Hypothyroidism 02/01/2014   Allergic rhinitis 02/01/2014   Bone pain 02/18/2013   Multiple myeloma in remission ([place]) 07/10/2012   Low back pain 02/11/2012   Esophageal reflux 05/31/2008   History of duodenal ulcer 01/05/2008   PROSTATE CANCER, HX OF 01/05/2008  PCP: de [place], Blondell Reveal, MD  REFERRING PROVIDER: de [place], Raymond J, MD  REFERRING DIAG: [phone] (ICD-10-CM) - Greater trochanteric pain syndrome of left lower extremity  M54.41,G89.29 (ICD-10-CM) - Chronic bilateral low back pain with right-sided sciatica  M54.2 (ICD-10-CM) - Neck pain   THERAPY DIAG:  L hip pain LBP with R sided sciatica Cervicalgia Muscle weakness Difficulty in walking Joint stiffness in cervical  ONSET DATE: Chronic pain; MD visit/script on 11/21/2020  SUBJECTIVE:                                                                                                                                                                                            SUBJECTIVE STATEMENT: Pt has had chronic pain for several years though his neck pain began 6 months ago.  Pain in lumbar travels to bilat hips and down R LE to ankle.  Burning pain in R upper thigh and feels cold at knee to ankle. Pain in lateral hip.  L sided cervical, UT, and L medial scap.  Pt also has pain inferior to R scapular.  Best pain is waking up in a good position 1st thing in AM.  Pt has tried hip injections approx 5-6 years ago which only helped for 3-4 days.  Pt is using nitroglycerin patches.  He had PT in 2020 though reports no improvement.    He has pain with sit/stand transfers.   8-9/10 L hip pain with stand to sit transfers.  Pt has increased pain with ambulation and is limited with ambulation.  He has to push the cart in the grocery store.  When his L hip is really hurting, he is barely able to walk.  Pt is limited with standing duration and standing activities.  Pt has pain with sitting.  Pt states he is unable to don socks which his wife does for him.  Pt has difficulty sleeping.                      PERTINENT HISTORY:  Multiple Myeloma, Hx of prostate CA and skin CA.  Pt states he has been in remission for 8 years.  2 Lumbar Surgeries in 2012 with one being a fusion.  Tear of the left gluteus minimus tendon per MRI in 2019.  history of PEs for which he is on chronic anticoagulation.  Inguinal hernia repair on 02/20/2011, OA in bilat hips.  Prior fractures and sprains of R foot.   PAIN:  Are you having pain? Yes Pain scale: 6-7/10. 10/10 worst, 4/10 best Pain location: lumbar Pain scale:  3-4/10 current, 8-9/10 pain with stand to sit  transfers, best pain is "indistinguishable"  Pain location: L lateral hip Pain scale:  2/10 current, 6/10 worst, 2/10 best Pain Location: L sided cervical, UT, and L medial scap  PAIN TYPE: aching, burning, and sharp Pain description: constant  Aggravating factors: walking,  standing, sitting Relieving factors: Nitroglycerin patches, heat a little for cervical, ice for lumbar  PRECAUTIONS: Other: Hx of multiple myeloma which pt is in remission.  Prior note in chart indicates pt unable to lie prone due to multiple myeloma.  Lumbar fusion in 2012.  Hx of PEs.    WEIGHT BEARING RESTRICTIONS No  FALLS:  Has patient fallen in last 6 months? No.  Pt hasn't fallen recently though has had falls in the past.      OCCUPATION: Retired.  Works at [employer] part time.  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  PLOF: Independent.  Pt's wife dons socks.  Pt has a hx of chronic pain which has affected his daily activities and mobility.   PATIENT GOALS: to be pain-free, to be able to don shoes and socks, improved pain with walking.  To be able to run   OBJECTIVE:   DIAGNOSTIC FINDINGS:  (Per Epic)   -04/15/2018 MRI pelvis IMPRESSION: 1. The fatty and cystic lesion in the left sacrum is stable from previous MRI of 6 months ago. Based on older comparison studies, this is favored to reflect a treated multiple myeloma lesion. 2. No new lesions or pathologic fracture. 3. Mild trochanteric bursitis bilaterally.   -MRI of pelvis in 2019:  Gluteals: Tear of the left gluteus minimus tendon.   -X ray in B hips 07/01/2017 showed Mild bilateral hip joint degenerative changes  -Lumbar x rays on 03/23/2020:  -Unchanged mild disc height loss at L1-L2. Unchanged advanced facet arthropathy at L4-L5 and L5-S1.  Impression: 1. No acute osseous abnormality. 2. Prior L3 corpectomy and L2-L4 posterior fusion without hardware complication. 3. Unchanged mild degenerative disc disease at L1-L2. -Prior Lumbar MRI:  moderate bilat L4-5 and L5-S1 canal stenosis.  Moderate bilat L4-5 foraminal stenosis and mild bilat L5-S1 foraminal stenosis.  PATIENT SURVEYS:  Modified Oswestry 54%     COGNITION:  Overall cognitive status: Within functional limits for tasks  assessed     SENSATION:  Light touch::  1+ to R L4 and L L5, 2+ otherwise t/o Ue's.     POSTURE:  Sits in a slouched posture.  Rounded shoulders and FHP.   LUMBARAROM/PROM  A/PROM A/PROM  12/06/2020  Flexion 50% with pain  Extension Pt able to stand at neutral  Right lateral flexion 30% with pain  Left lateral flexion 25% with pain  Right rotation   Left rotation    (Blank rows = not tested)  Cervical AROM:           Flex: WFL with pain  Ext:  22 with pain  SB:  R:  18, L:  10 deg with pain  Rotation: R: 75%, L:  25% with pain  LE STRENGTH:  STRENGTH Right 12/06/2020 Left 12/06/2020  Hip flexion 5/5 4+/5  Hip extension    Hip abduction 4/5 4-/5  Hip adduction    Hip internal rotation    Hip external rotation 5/5 4+/5  Knee flexion 5/5 seated 5/5 seated  Knee extension 5/5 4/5  Ankle dorsiflexion    Ankle plantarflexion    Ankle inversion    Ankle eversion     (Blank rows = not tested)  LE AROM:  AROM  Right 12/06/2020 Left 12/06/2020  Hip flexion    Hip extension    Hip abduction 32 28  Hip adduction    Hip internal rotation    Hip external rotation 32 18  Knee flexion    Knee extension    Ankle dorsiflexion    Ankle plantarflexion    Ankle inversion    Ankle eversion     (Blank rows = not tested)  LUMBAR SPECIAL TESTS:  Supine SLR next visit.    TENDERNESS TO PALPATION:  L cervical paraspinals.  Tender to L of C5-C6.  L GT.  R > L sided lumbar paraspinals  FUNCTIONAL TESTS:    Very slow with sit to stand and uses his hands to lower himself to chair.   Slow and painful with bed mobility  GAIT: Assistive device utilized: None Level of assistance: Independent Comments: forward flexed, stiff gait with decreased pelvic rotation and very limited reciprocal arm swing    TODAY'S TREATMENT  See pt education below.   PATIENT EDUCATION:  Education details: Educated pt in rehab process, POC, and goal expectations.  Educated pt in objective findings  and dx.   Person educated: Patient Education method: Explanation Education comprehension: verbalized understanding   HOME EXERCISE PROGRAM: Will give next visit  ASSESSMENT:  CLINICAL IMPRESSION: Patient is a 80 y.o. male who was seen today for physical therapy evaluation and treatment for Greater trochanteric pain syndrome of left lower extremity, Chronic bilateral low back pain with right-sided sciatica, and Neck pain.  Pt has a hx of chronic pain in lumbar and L hip and reports neck pain beginning 6 months ago.  He has tried PT in the past without improvement.  Pt has increased pain with sit/stand transfers and is very slow and cautious with performing sit/stand transfers.  He has increased pain and is limited with ambulation, standing duration, and standing activities.  He pushes a cart with shopping and also has pain with sitting. Pt has limited cervical and lumbar AROM and muscle weakness in L hip and knee.  Objective impairments include Abnormal gait, decreased activity tolerance, decreased endurance, decreased mobility, difficulty walking, decreased ROM, decreased strength, hypomobility, impaired flexibility, postural dysfunction, and pain. These impairments are limiting patient from community activity, occupation, shopping, and ambulation . Personal factors including Time since onset of injury/illness/exacerbation and 3+ comorbidities:  Hx of multiple myeloma, lumbar fusion, gluteus minimus tear, and PE's  are also affecting patient's functional outcome. Patient may benefit from skilled PT to address above impairments and improve overall function.  REHAB POTENTIAL: Fair chronic pain, multiple co-morbidities, and response to prior PT.  CLINICAL DECISION MAKING: Evolving/moderate complexity  EVALUATION COMPLEXITY: Moderate   GOALS:   SHORT TERM GOALS:  STG Name Target Date Goal status  1 Pt will be independent and compliant with HEP for improved pain, strength, ROM, and function.   Baseline:  12/26/2020 INITIAL  2 Pt will report at least a 25% improvement in pain and sx's overall for improved mobility and tolerance to activity. Baseline:  12/26/2020 INITIAL  3 Pt will demo improved cervical AROM by at least 10-15 deg increase in bilat SB'ing, 10 deg increase in extension, and rotation to be [facility] on R and 75% on L for improved stiffness and daily mobility.   Baseline: 12/26/2020  INITIAL  4 Pt will report reduced pain with sit/stand transfers and demonstrate increased ease and speed with those transfers. Baseline: 12/26/2020 INITIAL  LONG TERM GOALS:   LTG Name Target Date Goal status  1 Pt will report he is able to grocery shop without as much reliance on shopping cart Baseline: 01/16/2021 INITIAL  2 Pt will be able to ambulate community distance without significant pain or limitation.  Baseline: 01/16/2021 INITIAL  3 Pt will report at least a 50% improvement in performance of and tolerance with his normal standing activities. Baseline: 01/16/2021 INITIAL  4 Pt will report reduced pain with work activities Baseline: 01/16/2021 INITIAL  5 Pt will demo improved R hip abd strength, L hip flexion and ER strength,  and L knee ext strength to 5/5 MMT and L hip abd to at least 4+/5 MMT strength for improved performance of functional mobility skills and work activities.  Baseline: 01/16/2021 INITIAL             PLAN: PT FREQUENCY: 2x/week  PT DURATION: 6 weeks  PLANNED INTERVENTIONS: Therapeutic exercises, Therapeutic activity, Neuro Muscular re-education, Balance training, Gait training, Patient/Family education, Stair training, and Aquatic Therapy  PLAN FOR NEXT SESSION: Supine SLR test, give HEP.  Review/Teach log roll.  Aquatic therapy may be an option if pt doesn't respond to land based exercises.    Selinda Michaels III PT, DPT 12/06/20 9:03 AM  PHYSICAL THERAPY DISCHARGE SUMMARY  Visits from Start of Care: 1  Current functional level  related to goals / functional outcomes: Unable to assess current functional status or goals due to pt not present at discharge.   Remaining deficits: See above   Education / Equipment: See above   Pt was seen for an initial evaluation.  He then No-showed on his following appt in November.  Pt is being discharged due to not scheduling any further PT.    Selinda Michaels III PT, DPT 05/05/21 12:30 AM

## 2020-12-05 ENCOUNTER — Encounter (HOSPITAL_BASED_OUTPATIENT_CLINIC_OR_DEPARTMENT_OTHER): Payer: Self-pay | Admitting: Physical Therapy

## 2020-12-05 ENCOUNTER — Other Ambulatory Visit: Payer: Self-pay

## 2020-12-05 ENCOUNTER — Ambulatory Visit (HOSPITAL_BASED_OUTPATIENT_CLINIC_OR_DEPARTMENT_OTHER): Payer: Medicare Other | Attending: Family Medicine | Admitting: Physical Therapy

## 2020-12-05 DIAGNOSIS — M6281 Muscle weakness (generalized): Secondary | ICD-10-CM | POA: Diagnosis not present

## 2020-12-05 DIAGNOSIS — M25552 Pain in left hip: Secondary | ICD-10-CM | POA: Insufficient documentation

## 2020-12-05 DIAGNOSIS — G8929 Other chronic pain: Secondary | ICD-10-CM | POA: Insufficient documentation

## 2020-12-05 DIAGNOSIS — M5441 Lumbago with sciatica, right side: Secondary | ICD-10-CM | POA: Diagnosis not present

## 2020-12-05 DIAGNOSIS — R262 Difficulty in walking, not elsewhere classified: Secondary | ICD-10-CM | POA: Insufficient documentation

## 2020-12-05 DIAGNOSIS — M542 Cervicalgia: Secondary | ICD-10-CM | POA: Insufficient documentation

## 2020-12-12 ENCOUNTER — Other Ambulatory Visit (HOSPITAL_BASED_OUTPATIENT_CLINIC_OR_DEPARTMENT_OTHER): Payer: Self-pay | Admitting: Family Medicine

## 2020-12-12 DIAGNOSIS — Z7901 Long term (current) use of anticoagulants: Secondary | ICD-10-CM

## 2020-12-24 ENCOUNTER — Other Ambulatory Visit (HOSPITAL_BASED_OUTPATIENT_CLINIC_OR_DEPARTMENT_OTHER): Payer: Self-pay | Admitting: Family Medicine

## 2020-12-26 ENCOUNTER — Ambulatory Visit (HOSPITAL_BASED_OUTPATIENT_CLINIC_OR_DEPARTMENT_OTHER): Payer: Medicare Other | Admitting: Cardiology

## 2020-12-26 ENCOUNTER — Ambulatory Visit (INDEPENDENT_AMBULATORY_CARE_PROVIDER_SITE_OTHER): Payer: Medicare Other | Admitting: Family Medicine

## 2020-12-26 ENCOUNTER — Other Ambulatory Visit: Payer: Self-pay

## 2020-12-26 DIAGNOSIS — Z7901 Long term (current) use of anticoagulants: Secondary | ICD-10-CM

## 2020-12-26 LAB — PROTIME-INR
INR: 2.1 — ABNORMAL HIGH (ref 0.9–1.2)
Prothrombin Time: 21 s — ABNORMAL HIGH (ref 9.1–12.0)

## 2020-12-26 NOTE — Progress Notes (Signed)
Pt presents for INR check Attempted two finger sticks to perform POC INR but unable to obtain result Will send out INR stat

## 2020-12-27 LAB — PROTIME-INR

## 2020-12-29 ENCOUNTER — Ambulatory Visit (INDEPENDENT_AMBULATORY_CARE_PROVIDER_SITE_OTHER): Payer: Medicare Other

## 2020-12-29 ENCOUNTER — Ambulatory Visit (INDEPENDENT_AMBULATORY_CARE_PROVIDER_SITE_OTHER): Payer: Medicare Other | Admitting: Podiatry

## 2020-12-29 ENCOUNTER — Other Ambulatory Visit: Payer: Self-pay

## 2020-12-29 DIAGNOSIS — M79674 Pain in right toe(s): Secondary | ICD-10-CM

## 2020-12-29 DIAGNOSIS — M779 Enthesopathy, unspecified: Secondary | ICD-10-CM

## 2020-12-29 DIAGNOSIS — Z7901 Long term (current) use of anticoagulants: Secondary | ICD-10-CM | POA: Diagnosis not present

## 2020-12-29 DIAGNOSIS — B351 Tinea unguium: Secondary | ICD-10-CM | POA: Diagnosis not present

## 2020-12-29 DIAGNOSIS — M722 Plantar fascial fibromatosis: Secondary | ICD-10-CM

## 2021-01-01 NOTE — Progress Notes (Signed)
Subjective: 80 year old male presents the office today for follow evaluation of plantar fasciitis on the right side.  On his feet during the day.  He denies recent injury or trauma otherwise to his feet.  Also asking for nails be trimmed dystrophic and elongated causing discomfort.  No swelling or redness of the toenail sites.  No open sores that he reports.  No other concerns.  Objective: AAO x3, NAD DP/PT pulses palpable bilaterally, CRT less than 3 seconds There is no significant tenderness palpation on the plantar medial tubercle of the calcaneus at the insertion of plantar fascial bilaterally today he still gets discomfort to the areas.  Plantar fascial appears to be intact as well as the Achilles tendon.  I am unable to elicit any area of pinpoint tenderness otherwise.  Flexor, extensor tendons appear to be intact.  Nails are hypertrophic, dystrophic, brittle, discolored, elongated 10. No surrounding redness or drainage. Tenderness nails 1-5 bilaterally. No open lesions or pre-ulcerative lesions are identified today. No pain with calf compression, swelling, warmth, erythema  Assessment: Right heel pain, plantar fasciitis; symptomatic onychomycosis  Plan: -All treatment options discussed with the patient including all alternatives, risks, complications.  -New x-rays obtained reviewed.  No evidence of acute fracture.  Calcaneal spurring is present.  Surgical clips noted on the right foot. -Given his ongoing foot discomfort recommend physical therapy as well.  Already going for his hip.  Will refer for his foot as well.  Continue shoes and good arch supports.  I do think that part of his symptoms are due to being on his feet all day on hard surfaces when he works. -Nails sharply debrided x10 without any complications or bleeding. -Patient encouraged to call the office with any questions, concerns, change in symptoms.   Trula Slade DPM

## 2021-01-10 ENCOUNTER — Ambulatory Visit (HOSPITAL_BASED_OUTPATIENT_CLINIC_OR_DEPARTMENT_OTHER): Payer: Medicare Other | Attending: Family Medicine | Admitting: Physical Therapy

## 2021-01-10 DIAGNOSIS — M5441 Lumbago with sciatica, right side: Secondary | ICD-10-CM | POA: Insufficient documentation

## 2021-01-10 DIAGNOSIS — M542 Cervicalgia: Secondary | ICD-10-CM | POA: Insufficient documentation

## 2021-01-10 DIAGNOSIS — M6281 Muscle weakness (generalized): Secondary | ICD-10-CM | POA: Insufficient documentation

## 2021-01-10 DIAGNOSIS — M25552 Pain in left hip: Secondary | ICD-10-CM | POA: Insufficient documentation

## 2021-01-10 DIAGNOSIS — G8929 Other chronic pain: Secondary | ICD-10-CM | POA: Insufficient documentation

## 2021-01-10 DIAGNOSIS — R262 Difficulty in walking, not elsewhere classified: Secondary | ICD-10-CM | POA: Insufficient documentation

## 2021-01-11 ENCOUNTER — Other Ambulatory Visit (HOSPITAL_BASED_OUTPATIENT_CLINIC_OR_DEPARTMENT_OTHER): Payer: Self-pay | Admitting: Family Medicine

## 2021-01-11 DIAGNOSIS — Z7901 Long term (current) use of anticoagulants: Secondary | ICD-10-CM

## 2021-01-16 ENCOUNTER — Encounter (HOSPITAL_BASED_OUTPATIENT_CLINIC_OR_DEPARTMENT_OTHER): Payer: Self-pay | Admitting: Family Medicine

## 2021-01-16 ENCOUNTER — Ambulatory Visit (INDEPENDENT_AMBULATORY_CARE_PROVIDER_SITE_OTHER): Payer: Medicare Other | Admitting: Family Medicine

## 2021-01-16 ENCOUNTER — Other Ambulatory Visit: Payer: Self-pay

## 2021-01-16 VITALS — BP 100/70 | HR 71 | Ht 71.0 in | Wt 207.4 lb

## 2021-01-16 DIAGNOSIS — M5441 Lumbago with sciatica, right side: Secondary | ICD-10-CM | POA: Diagnosis not present

## 2021-01-16 DIAGNOSIS — M25552 Pain in left hip: Secondary | ICD-10-CM | POA: Diagnosis not present

## 2021-01-16 DIAGNOSIS — Z7901 Long term (current) use of anticoagulants: Secondary | ICD-10-CM

## 2021-01-16 DIAGNOSIS — G8929 Other chronic pain: Secondary | ICD-10-CM | POA: Diagnosis not present

## 2021-01-16 NOTE — Assessment & Plan Note (Signed)
Has established with physical therapy, however no follow-up visits recently, was scheduled last week, however did not show for this appointment Symptoms mostly unchanged, utilizing conservative measures to help with pain control at present Recommend continuing follow-up with physical therapy, patient advised to schedule appointment with physical therapy for continued treatment, home exercise program as per PT

## 2021-01-16 NOTE — Patient Instructions (Signed)
  Medication Instructions:  Your physician recommends that you continue on your current medications as directed. Please refer to the Current Medication list given to you today. --If you need a refill on any your medications before your next appointment, please call your pharmacy first. If no refills are authorized on file call the office.--  Follow-Up: Your next appointment:   Your physician recommends that you schedule a follow-up appointment in: 2-3 MONTHS with Dr. de [place]  You will receive a text message or e-mail with a link to a survey about your care and experience with [place] today! We would greatly appreciate your feedback!   Thanks for letting us be apart of your health journey!!  Primary Care and Sports Medicine   Dr. Arlina Robes [place]   We encourage you to activate your patient portal called "MyChart".  Sign up information is provided on this After Visit Summary.  MyChart is used to connect with patients for Virtual Visits (Telemedicine).  Patients are able to view lab/test results, encounter notes, upcoming appointments, etc.  Non-urgent messages can be sent to your provider as well. To learn more about what you can do with MyChart, please visit --  [email or web address].

## 2021-01-16 NOTE — Assessment & Plan Note (Signed)
INR goal of 2.0-3.0 Most recent reading about 3 weeks ago was within therapeutic range Due for repeat check, will check INR today, any changes to warfarin dosage depending upon INR level

## 2021-01-16 NOTE — Progress Notes (Signed)
    Procedures performed today:    None.  Independent interpretation of notes and tests performed by another provider:   None.  Brief History, Exam, Impression, and Recommendations:    BP 100/70   Pulse 71   Ht 5\' 11"  (1.803 m)   Wt 207 lb 6.4 oz (94.1 kg)   SpO2 98%   BMI 28.93 kg/m   Greater trochanteric pain syndrome of left lower extremity Had an initial evaluation with physical therapy, however was a no-show for follow-up visit scheduled for last week Symptoms mostly unchanged which is not unexpected given he has only had initial visit with physical therapy Recommend continue to follow-up with PT, home exercise program as per PT Patient to schedule further follow-up visits with physical therapy  Long term (current) use of anticoagulants INR goal of 2.0-3.0 Most recent reading about 3 weeks ago was within therapeutic range Due for repeat check, will check INR today, any changes to warfarin dosage depending upon INR level  Low back pain Has established with physical therapy, however no follow-up visits recently, was scheduled last week, however did not show for this appointment Symptoms mostly unchanged, utilizing conservative measures to help with pain control at present Recommend continuing follow-up with physical therapy, patient advised to schedule appointment with physical therapy for continued treatment, home exercise program as per PT  Plan for follow-up in about 2 to 3 months or sooner as needed   ___________________________________________ [place] de [place], MD, ABFM, CAQSM Primary Care and [place]

## 2021-01-16 NOTE — Assessment & Plan Note (Signed)
Had an initial evaluation with physical therapy, however was a no-show for follow-up visit scheduled for last week Symptoms mostly unchanged which is not unexpected given he has only had initial visit with physical therapy Recommend continue to follow-up with PT, home exercise program as per PT Patient to schedule further follow-up visits with physical therapy

## 2021-01-17 LAB — PROTIME-INR
INR: 2.9 — ABNORMAL HIGH (ref 0.9–1.2)
Prothrombin Time: 29.3 s — ABNORMAL HIGH (ref 9.1–12.0)

## 2021-01-22 ENCOUNTER — Other Ambulatory Visit (HOSPITAL_BASED_OUTPATIENT_CLINIC_OR_DEPARTMENT_OTHER): Payer: Self-pay | Admitting: Family Medicine

## 2021-01-25 ENCOUNTER — Other Ambulatory Visit: Payer: Self-pay | Admitting: Family Medicine

## 2021-01-30 ENCOUNTER — Telehealth (HOSPITAL_BASED_OUTPATIENT_CLINIC_OR_DEPARTMENT_OTHER): Payer: Self-pay | Admitting: Family Medicine

## 2021-01-30 DIAGNOSIS — N3281 Overactive bladder: Secondary | ICD-10-CM

## 2021-01-30 MED ORDER — OMEPRAZOLE 20 MG PO CPDR
20.0000 mg | DELAYED_RELEASE_CAPSULE | Freq: Two times a day (BID) | ORAL | 1 refills | Status: DC
Start: 2021-01-30 — End: 2021-07-30

## 2021-01-30 MED ORDER — NITROGLYCERIN 0.2 MG/HR TD PT24
MEDICATED_PATCH | TRANSDERMAL | 0 refills | Status: AC
Start: 2021-01-30 — End: ?

## 2021-01-30 MED ORDER — GABAPENTIN 300 MG PO CAPS: ORAL_CAPSULE | ORAL | 0 refills | Status: DC

## 2021-01-30 MED ORDER — POTASSIUM CHLORIDE ER 20 MEQ PO TBCR: 1.0000 | EXTENDED_RELEASE_TABLET | Freq: Every day | ORAL | 1 refills | Status: DC

## 2021-01-30 NOTE — Telephone Encounter (Signed)
Received and after hours fax on 01/28/21 stating that two of pts medications were not sent into the pharmacy. Pt would like these called in. Pt would like a call when these have been sent in. Please advise.

## 2021-02-20 ENCOUNTER — Other Ambulatory Visit (HOSPITAL_BASED_OUTPATIENT_CLINIC_OR_DEPARTMENT_OTHER): Payer: Self-pay | Admitting: Family Medicine

## 2021-02-22 ENCOUNTER — Other Ambulatory Visit (HOSPITAL_BASED_OUTPATIENT_CLINIC_OR_DEPARTMENT_OTHER): Payer: Self-pay | Admitting: Family Medicine

## 2021-02-22 DIAGNOSIS — Z7901 Long term (current) use of anticoagulants: Secondary | ICD-10-CM

## 2021-03-13 DIAGNOSIS — C9000 Multiple myeloma not having achieved remission: Secondary | ICD-10-CM | POA: Diagnosis not present

## 2021-03-13 DIAGNOSIS — R6889 Other general symptoms and signs: Secondary | ICD-10-CM | POA: Diagnosis not present

## 2021-03-13 DIAGNOSIS — C9001 Multiple myeloma in remission: Secondary | ICD-10-CM | POA: Diagnosis not present

## 2021-03-19 DIAGNOSIS — R197 Diarrhea, unspecified: Secondary | ICD-10-CM | POA: Diagnosis not present

## 2021-03-19 DIAGNOSIS — R110 Nausea: Secondary | ICD-10-CM | POA: Diagnosis not present

## 2021-03-21 DIAGNOSIS — R197 Diarrhea, unspecified: Secondary | ICD-10-CM | POA: Diagnosis not present

## 2021-03-21 DIAGNOSIS — R109 Unspecified abdominal pain: Secondary | ICD-10-CM | POA: Diagnosis not present

## 2021-03-21 DIAGNOSIS — K529 Noninfective gastroenteritis and colitis, unspecified: Secondary | ICD-10-CM | POA: Diagnosis not present

## 2021-03-21 DIAGNOSIS — R110 Nausea: Secondary | ICD-10-CM | POA: Diagnosis not present

## 2021-03-22 DIAGNOSIS — G5603 Carpal tunnel syndrome, bilateral upper limbs: Secondary | ICD-10-CM | POA: Diagnosis not present

## 2021-03-22 DIAGNOSIS — E039 Hypothyroidism, unspecified: Secondary | ICD-10-CM | POA: Diagnosis not present

## 2021-03-22 DIAGNOSIS — N3289 Other specified disorders of bladder: Secondary | ICD-10-CM | POA: Diagnosis not present

## 2021-03-22 DIAGNOSIS — Z7901 Long term (current) use of anticoagulants: Secondary | ICD-10-CM | POA: Diagnosis not present

## 2021-03-22 DIAGNOSIS — Z8679 Personal history of other diseases of the circulatory system: Secondary | ICD-10-CM | POA: Diagnosis not present

## 2021-03-22 DIAGNOSIS — C9000 Multiple myeloma not having achieved remission: Secondary | ICD-10-CM | POA: Diagnosis not present

## 2021-03-22 DIAGNOSIS — G8929 Other chronic pain: Secondary | ICD-10-CM | POA: Diagnosis not present

## 2021-03-22 DIAGNOSIS — I824Z9 Acute embolism and thrombosis of unspecified deep veins of unspecified distal lower extremity: Secondary | ICD-10-CM | POA: Diagnosis not present

## 2021-03-22 DIAGNOSIS — K219 Gastro-esophageal reflux disease without esophagitis: Secondary | ICD-10-CM | POA: Diagnosis not present

## 2021-03-22 DIAGNOSIS — R197 Diarrhea, unspecified: Secondary | ICD-10-CM | POA: Diagnosis not present

## 2021-04-03 ENCOUNTER — Other Ambulatory Visit: Payer: Self-pay

## 2021-04-03 ENCOUNTER — Ambulatory Visit (INDEPENDENT_AMBULATORY_CARE_PROVIDER_SITE_OTHER): Payer: Medicare Other | Admitting: Podiatry

## 2021-04-03 ENCOUNTER — Ambulatory Visit (INDEPENDENT_AMBULATORY_CARE_PROVIDER_SITE_OTHER): Payer: Medicare Other

## 2021-04-03 DIAGNOSIS — M7672 Peroneal tendinitis, left leg: Secondary | ICD-10-CM | POA: Diagnosis not present

## 2021-04-03 DIAGNOSIS — M79672 Pain in left foot: Secondary | ICD-10-CM | POA: Diagnosis not present

## 2021-04-03 DIAGNOSIS — M722 Plantar fascial fibromatosis: Secondary | ICD-10-CM | POA: Diagnosis not present

## 2021-04-03 MED ORDER — METHYLPREDNISOLONE 4 MG PO TBPK: ORAL_TABLET | ORAL | 0 refills | Status: DC

## 2021-04-05 NOTE — Progress Notes (Signed)
Subjective: 81 year old male presents the office with concerns of pain to the left foot plantar fifth metatarsal base.  States that last couple days has become more swollen.  Denies recent injury or changes to the knee.  No recent treatment since I last saw him.  Also gets pain in the bottom of his right heel.  No swelling on the right side.  No other concerns today.  No fevers or chills.  Objective: AAO x3, NAD DP/PT pulses palpable bilaterally, CRT less than 3 seconds On the left foot there is localized edema present left fifth metatarsal base there is tenderness palpation of this area as well as the distal portion of the peroneus brevis.  Clinically the tendons appear to be intact.  No other area discomfort on left side. On the right side there is tenderness palpation on the plantar medial tubercle of the calcaneus at the insertion plantar fascia.  Plantar fascial appears to be intact.  No pain with lateral compression of calcaneus.  No pain Achilles tendon.  MMT 5/5 bilaterally. No pain with calf compression, swelling, warmth, erythema  Assessment: Left insertional peroneal tendinitis, right plantar fasciitis  Plan: -All treatment options discussed with the patient including all alternatives, risks, complications.  -X-rays obtained and reviewed with left foot.  No subacute fracture.  Spurring present fifth metatarsal base. -Given both of these issues prescribed Medrol Dosepak.  Recommend immobilization for the left foot.  He has a cam boot at home. -For the right foot discussed stretching, icing daily.  Discussed wearing shoes and good arch support. -Icing daily -Patient encouraged to call the office with any questions, concerns, change in symptoms.   Return in about 2 weeks (around 04/17/2021) for left foot pain, swelling.  Trula Slade DPM

## 2021-04-07 DIAGNOSIS — R748 Abnormal levels of other serum enzymes: Secondary | ICD-10-CM | POA: Diagnosis not present

## 2021-04-07 DIAGNOSIS — N179 Acute kidney failure, unspecified: Secondary | ICD-10-CM | POA: Diagnosis not present

## 2021-04-18 ENCOUNTER — Ambulatory Visit (HOSPITAL_BASED_OUTPATIENT_CLINIC_OR_DEPARTMENT_OTHER): Payer: Medicare Other | Admitting: Family Medicine

## 2021-04-26 ENCOUNTER — Other Ambulatory Visit (HOSPITAL_BASED_OUTPATIENT_CLINIC_OR_DEPARTMENT_OTHER): Payer: Self-pay | Admitting: Family Medicine

## 2021-05-10 DIAGNOSIS — I824Z9 Acute embolism and thrombosis of unspecified deep veins of unspecified distal lower extremity: Secondary | ICD-10-CM | POA: Diagnosis not present

## 2021-05-10 DIAGNOSIS — Z7901 Long term (current) use of anticoagulants: Secondary | ICD-10-CM | POA: Diagnosis not present

## 2021-05-12 ENCOUNTER — Encounter (HOSPITAL_BASED_OUTPATIENT_CLINIC_OR_DEPARTMENT_OTHER): Payer: Self-pay | Admitting: Family Medicine

## 2021-05-18 ENCOUNTER — Other Ambulatory Visit (HOSPITAL_BASED_OUTPATIENT_CLINIC_OR_DEPARTMENT_OTHER): Payer: Self-pay | Admitting: Family Medicine

## 2021-05-18 DIAGNOSIS — I824Z9 Acute embolism and thrombosis of unspecified deep veins of unspecified distal lower extremity: Secondary | ICD-10-CM | POA: Diagnosis not present

## 2021-05-18 DIAGNOSIS — Z7901 Long term (current) use of anticoagulants: Secondary | ICD-10-CM | POA: Diagnosis not present

## 2021-05-19 ENCOUNTER — Other Ambulatory Visit (HOSPITAL_BASED_OUTPATIENT_CLINIC_OR_DEPARTMENT_OTHER): Payer: Self-pay | Admitting: Family Medicine

## 2021-05-19 ENCOUNTER — Ambulatory Visit: Payer: Medicare Other | Admitting: Podiatry

## 2021-05-26 ENCOUNTER — Ambulatory Visit: Payer: Medicare Other | Admitting: Podiatry

## 2021-05-26 ENCOUNTER — Other Ambulatory Visit (HOSPITAL_BASED_OUTPATIENT_CLINIC_OR_DEPARTMENT_OTHER): Payer: Self-pay | Admitting: Family Medicine

## 2021-05-31 DIAGNOSIS — G8929 Other chronic pain: Secondary | ICD-10-CM | POA: Diagnosis not present

## 2021-05-31 DIAGNOSIS — I824Z9 Acute embolism and thrombosis of unspecified deep veins of unspecified distal lower extremity: Secondary | ICD-10-CM | POA: Diagnosis not present

## 2021-05-31 DIAGNOSIS — G5603 Carpal tunnel syndrome, bilateral upper limbs: Secondary | ICD-10-CM | POA: Diagnosis not present

## 2021-05-31 DIAGNOSIS — Z7901 Long term (current) use of anticoagulants: Secondary | ICD-10-CM | POA: Diagnosis not present

## 2021-06-05 ENCOUNTER — Ambulatory Visit: Payer: Medicare Other | Admitting: Podiatry

## 2021-06-12 DIAGNOSIS — R6889 Other general symptoms and signs: Secondary | ICD-10-CM | POA: Diagnosis not present

## 2021-06-12 DIAGNOSIS — C9001 Multiple myeloma in remission: Secondary | ICD-10-CM | POA: Diagnosis not present

## 2021-06-14 DIAGNOSIS — Z7901 Long term (current) use of anticoagulants: Secondary | ICD-10-CM | POA: Diagnosis not present

## 2021-06-14 DIAGNOSIS — I824Z9 Acute embolism and thrombosis of unspecified deep veins of unspecified distal lower extremity: Secondary | ICD-10-CM | POA: Diagnosis not present

## 2021-06-15 DIAGNOSIS — G5603 Carpal tunnel syndrome, bilateral upper limbs: Secondary | ICD-10-CM | POA: Diagnosis not present

## 2021-06-15 DIAGNOSIS — M25561 Pain in right knee: Secondary | ICD-10-CM | POA: Diagnosis not present

## 2021-06-15 DIAGNOSIS — S8001XA Contusion of right knee, initial encounter: Secondary | ICD-10-CM | POA: Diagnosis not present

## 2021-06-26 ENCOUNTER — Ambulatory Visit: Payer: Medicare Other | Admitting: Podiatry

## 2021-06-28 DIAGNOSIS — Z7901 Long term (current) use of anticoagulants: Secondary | ICD-10-CM | POA: Diagnosis not present

## 2021-07-25 ENCOUNTER — Ambulatory Visit (INDEPENDENT_AMBULATORY_CARE_PROVIDER_SITE_OTHER): Payer: Medicare Other | Admitting: Podiatry

## 2021-07-25 DIAGNOSIS — M722 Plantar fascial fibromatosis: Secondary | ICD-10-CM | POA: Diagnosis not present

## 2021-07-25 DIAGNOSIS — B351 Tinea unguium: Secondary | ICD-10-CM

## 2021-07-25 DIAGNOSIS — M79674 Pain in right toe(s): Secondary | ICD-10-CM

## 2021-07-25 DIAGNOSIS — Z7901 Long term (current) use of anticoagulants: Secondary | ICD-10-CM

## 2021-07-25 DIAGNOSIS — M7672 Peroneal tendinitis, left leg: Secondary | ICD-10-CM | POA: Diagnosis not present

## 2021-07-26 DIAGNOSIS — I824Z9 Acute embolism and thrombosis of unspecified deep veins of unspecified distal lower extremity: Secondary | ICD-10-CM | POA: Diagnosis not present

## 2021-07-26 DIAGNOSIS — Z7901 Long term (current) use of anticoagulants: Secondary | ICD-10-CM | POA: Diagnosis not present

## 2021-07-27 DIAGNOSIS — M542 Cervicalgia: Secondary | ICD-10-CM | POA: Diagnosis not present

## 2021-07-27 DIAGNOSIS — G5603 Carpal tunnel syndrome, bilateral upper limbs: Secondary | ICD-10-CM | POA: Diagnosis not present

## 2021-07-27 DIAGNOSIS — G5623 Lesion of ulnar nerve, bilateral upper limbs: Secondary | ICD-10-CM | POA: Diagnosis not present

## 2021-07-30 ENCOUNTER — Other Ambulatory Visit (HOSPITAL_BASED_OUTPATIENT_CLINIC_OR_DEPARTMENT_OTHER): Payer: Self-pay | Admitting: Family Medicine

## 2021-08-02 NOTE — Progress Notes (Signed)
Subjective: 81 year old male presents the office with concerns of thick, elongated toes that he is not able to trim himself.  No swelling redness or injury to the toenail sites.  So this pain in the left fifth metatarsal base area as well as along the plantar fascia on the right side.  He uses the nitro patches which seems to help.  He been doing the stretching exercise as well as wearing shoes and good arch support.   Objective: AAO x3, NAD DP/PT pulses palpable bilaterally, CRT less than 3 seconds On the left foot there is no significant edema present left fifth metatarsal base however there is still some mild tenderness palpation of this area along the distal portion of the peroneus brevis.  Mild discomfort along the plantar aspect the right calcaneus at the insertion of plantar fascia there is no area pinpoint tenderness.  MMT 5/5. Nails are hypertrophic, dystrophic, brittle, discolored, elongated 10. No surrounding redness or drainage. Tenderness nails 1-5 bilaterally. No open lesions or pre-ulcerative lesions are identified today. No pain with calf compression, swelling, warmth, erythema  Assessment: Left insertional peroneal tendinitis, right plantar fasciitis; symptomatic onychomycosis  Plan: -All treatment options discussed with the patient including all alternatives, risks, complications.  -Sharply debrided the nails x 10 without any complications or bleeding -Discussed steroid injection was to hold off on this.  He can continue the nitro patches.  Continue stretching, icing as well as wearing shoes and good arch support.  Return in about 3 months (around 10/25/2021).  Trula Slade DPM

## 2021-08-15 DIAGNOSIS — M25552 Pain in left hip: Secondary | ICD-10-CM | POA: Diagnosis not present

## 2021-08-15 DIAGNOSIS — K649 Unspecified hemorrhoids: Secondary | ICD-10-CM | POA: Diagnosis not present

## 2021-08-15 DIAGNOSIS — I824Z9 Acute embolism and thrombosis of unspecified deep veins of unspecified distal lower extremity: Secondary | ICD-10-CM | POA: Diagnosis not present

## 2021-08-15 DIAGNOSIS — Z7901 Long term (current) use of anticoagulants: Secondary | ICD-10-CM | POA: Diagnosis not present

## 2021-08-15 DIAGNOSIS — M549 Dorsalgia, unspecified: Secondary | ICD-10-CM | POA: Diagnosis not present

## 2021-08-18 DIAGNOSIS — M25552 Pain in left hip: Secondary | ICD-10-CM | POA: Diagnosis not present

## 2021-08-18 DIAGNOSIS — M546 Pain in thoracic spine: Secondary | ICD-10-CM | POA: Diagnosis not present

## 2021-08-18 DIAGNOSIS — M545 Low back pain, unspecified: Secondary | ICD-10-CM | POA: Diagnosis not present

## 2021-08-18 DIAGNOSIS — Z981 Arthrodesis status: Secondary | ICD-10-CM | POA: Diagnosis not present

## 2021-08-21 ENCOUNTER — Other Ambulatory Visit (HOSPITAL_BASED_OUTPATIENT_CLINIC_OR_DEPARTMENT_OTHER): Payer: Self-pay | Admitting: Family Medicine

## 2021-08-28 DIAGNOSIS — G5621 Lesion of ulnar nerve, right upper limb: Secondary | ICD-10-CM | POA: Diagnosis not present

## 2021-08-28 DIAGNOSIS — G5603 Carpal tunnel syndrome, bilateral upper limbs: Secondary | ICD-10-CM | POA: Diagnosis not present

## 2021-08-28 DIAGNOSIS — G5623 Lesion of ulnar nerve, bilateral upper limbs: Secondary | ICD-10-CM | POA: Diagnosis not present

## 2021-08-28 DIAGNOSIS — G5602 Carpal tunnel syndrome, left upper limb: Secondary | ICD-10-CM | POA: Diagnosis not present

## 2021-09-11 IMAGING — DX DG CHEST 2V
2 series · 2 of 2 positions shown · non-contrast
Comparison: 05/15/2017

CLINICAL DATA: Cough, wheezing

EXAM:
CHEST - 2 VIEW

[chest pa]
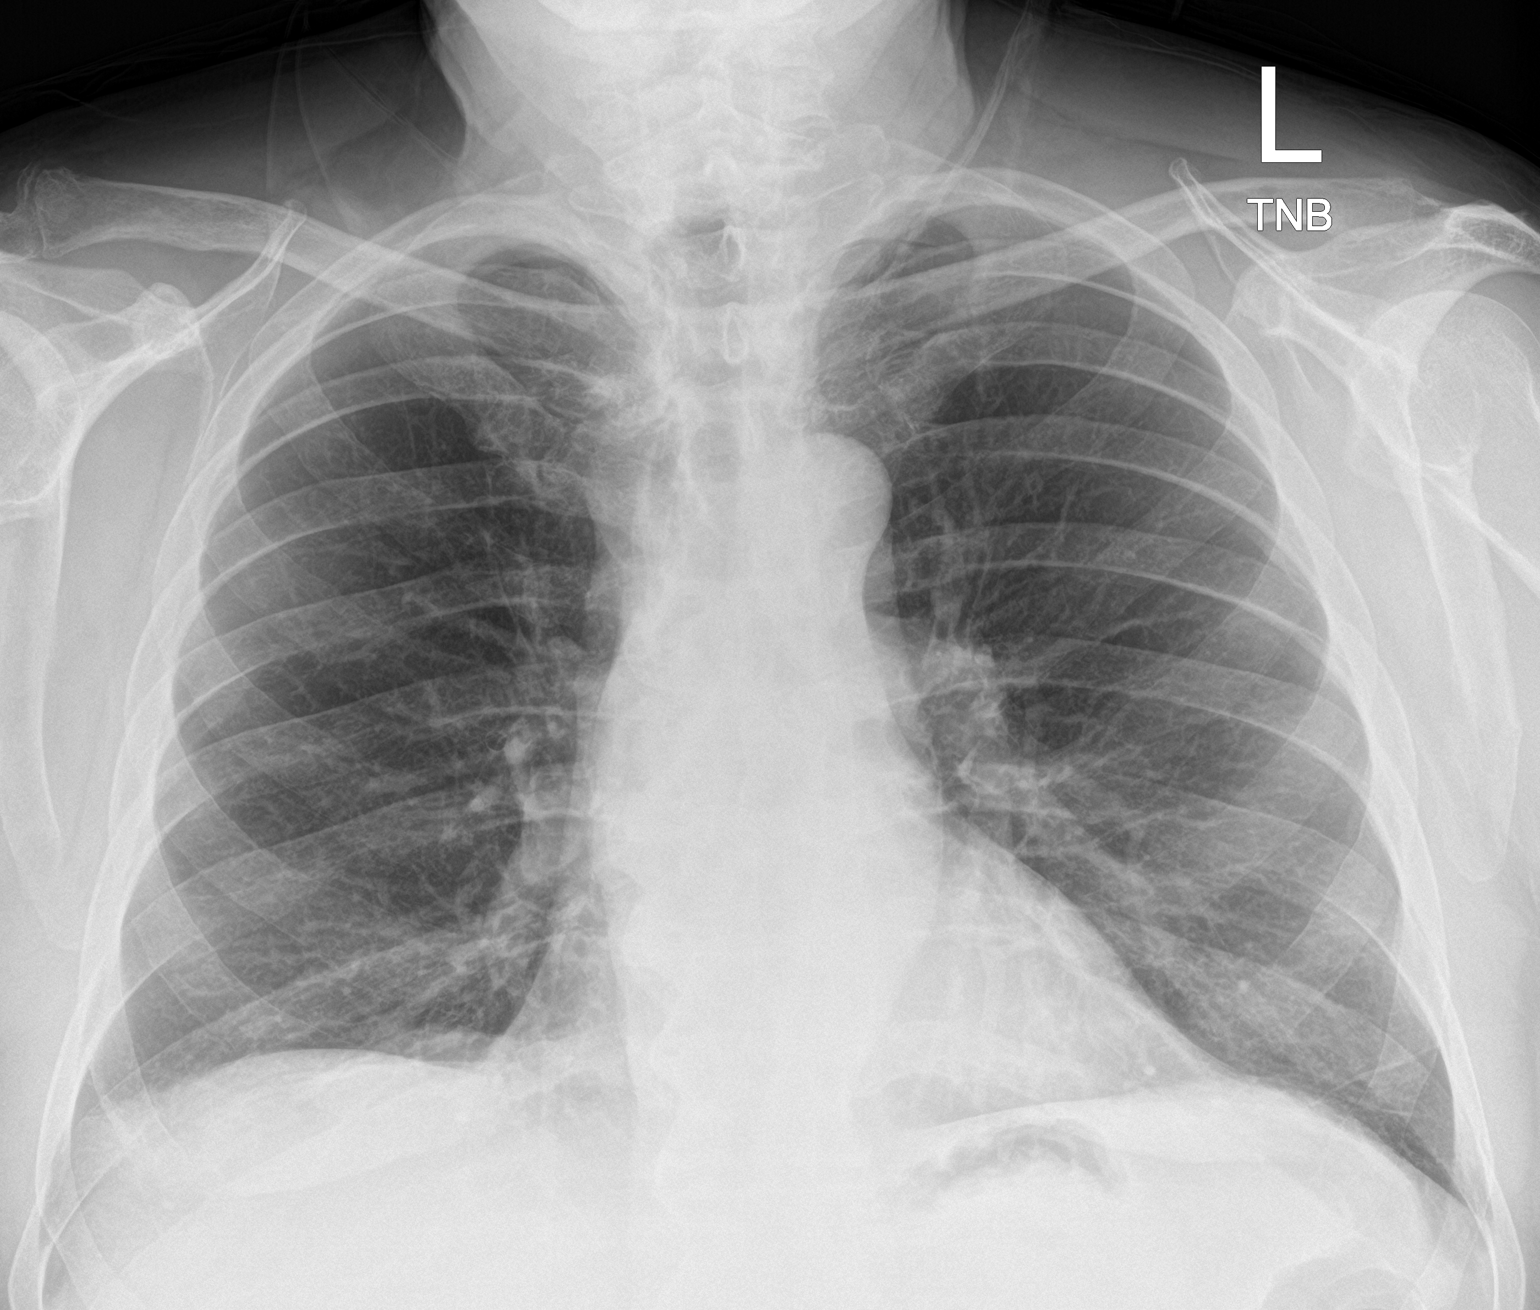

[chest lat]
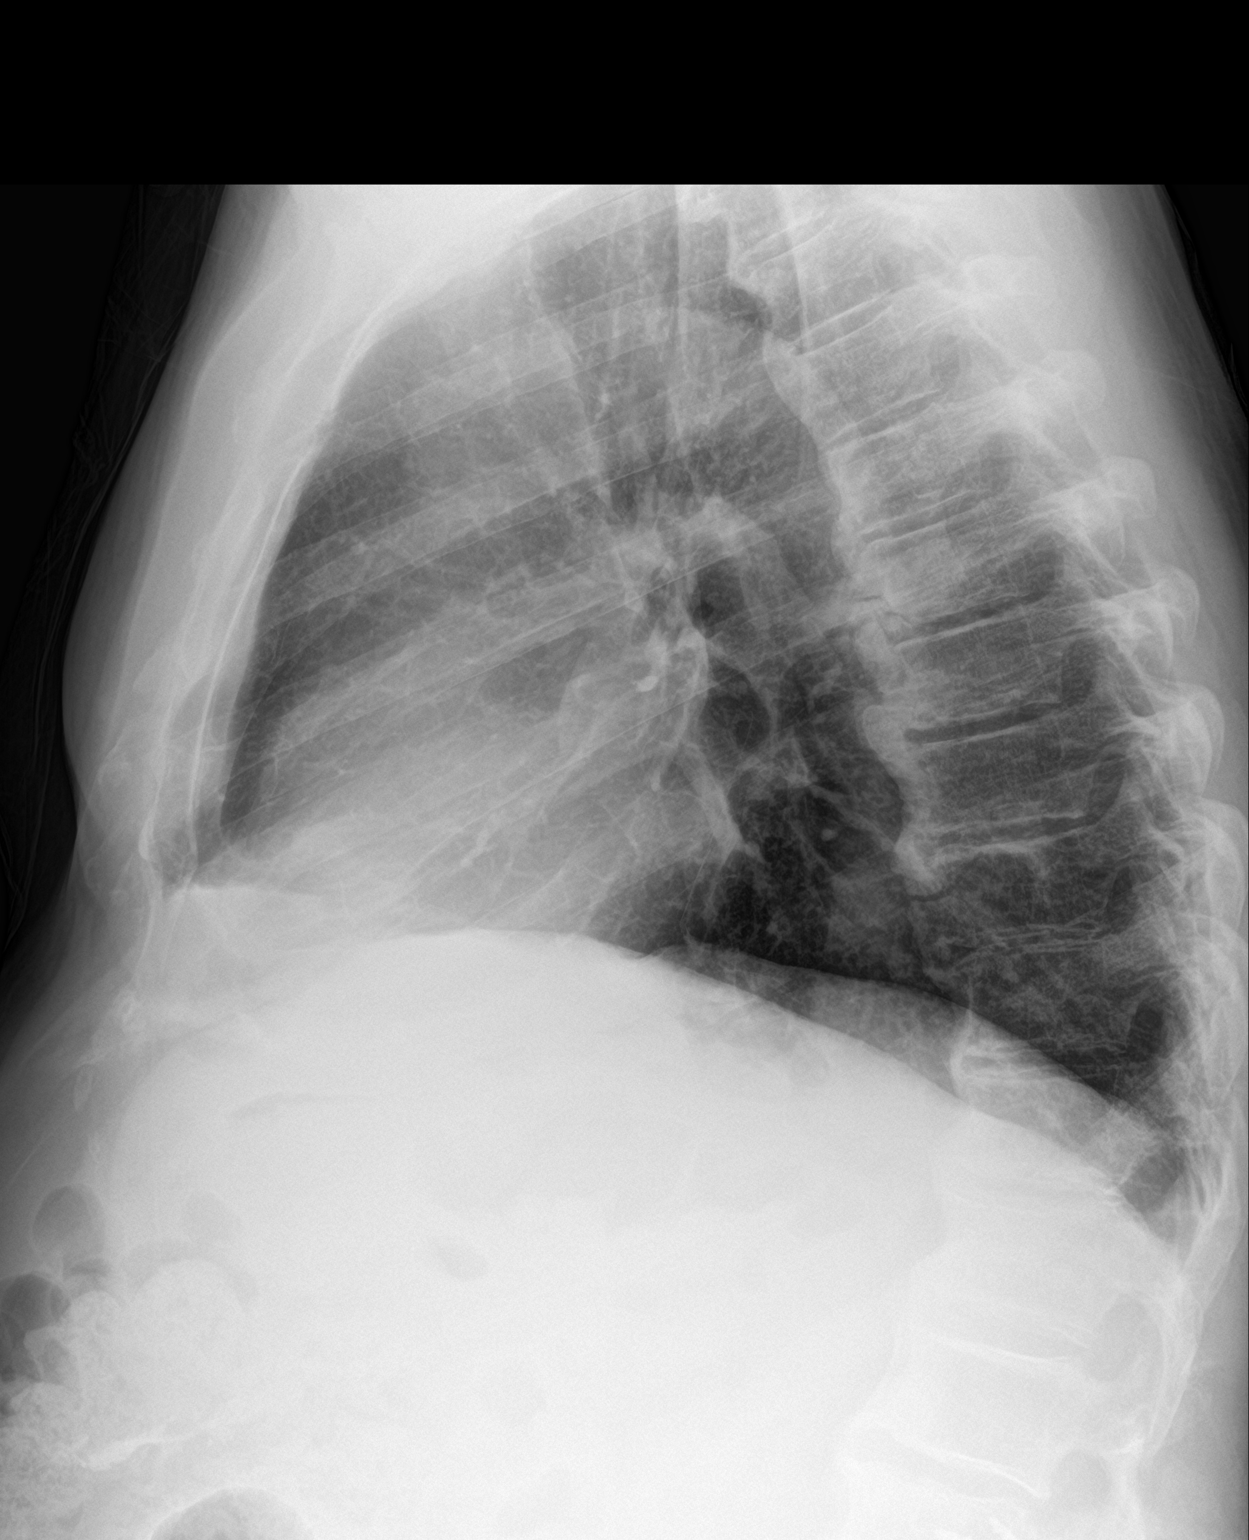

[2 of 2 positions shown; findings below may reference images not displayed]

FINDINGS: The heart size and mediastinal contours are within normal limits. No
focal airspace consolidation, pleural effusion, or pneumothorax.
Degenerative endplate changes throughout the thoracic spine.
IMPRESSION: No active cardiopulmonary disease.

## 2021-09-13 DIAGNOSIS — M25521 Pain in right elbow: Secondary | ICD-10-CM | POA: Diagnosis not present

## 2021-09-13 DIAGNOSIS — S51012A Laceration without foreign body of left elbow, initial encounter: Secondary | ICD-10-CM | POA: Diagnosis not present

## 2021-09-15 DIAGNOSIS — G5603 Carpal tunnel syndrome, bilateral upper limbs: Secondary | ICD-10-CM | POA: Diagnosis not present

## 2021-09-18 DIAGNOSIS — C9001 Multiple myeloma in remission: Secondary | ICD-10-CM | POA: Diagnosis not present

## 2021-09-18 DIAGNOSIS — R6889 Other general symptoms and signs: Secondary | ICD-10-CM | POA: Diagnosis not present

## 2021-09-19 DIAGNOSIS — I824Z9 Acute embolism and thrombosis of unspecified deep veins of unspecified distal lower extremity: Secondary | ICD-10-CM | POA: Diagnosis not present

## 2021-09-19 DIAGNOSIS — Z7901 Long term (current) use of anticoagulants: Secondary | ICD-10-CM | POA: Diagnosis not present

## 2021-09-20 DIAGNOSIS — Z4802 Encounter for removal of sutures: Secondary | ICD-10-CM | POA: Diagnosis not present

## 2021-09-20 DIAGNOSIS — L03113 Cellulitis of right upper limb: Secondary | ICD-10-CM | POA: Diagnosis not present

## 2021-09-28 DIAGNOSIS — G5623 Lesion of ulnar nerve, bilateral upper limbs: Secondary | ICD-10-CM | POA: Diagnosis not present

## 2021-09-28 DIAGNOSIS — G5603 Carpal tunnel syndrome, bilateral upper limbs: Secondary | ICD-10-CM | POA: Diagnosis not present

## 2021-10-06 DIAGNOSIS — G5622 Lesion of ulnar nerve, left upper limb: Secondary | ICD-10-CM | POA: Diagnosis not present

## 2021-10-06 DIAGNOSIS — G5602 Carpal tunnel syndrome, left upper limb: Secondary | ICD-10-CM | POA: Diagnosis not present

## 2021-10-09 DIAGNOSIS — I824Z9 Acute embolism and thrombosis of unspecified deep veins of unspecified distal lower extremity: Secondary | ICD-10-CM | POA: Diagnosis not present

## 2021-10-09 DIAGNOSIS — Z7901 Long term (current) use of anticoagulants: Secondary | ICD-10-CM | POA: Diagnosis not present

## 2021-10-13 ENCOUNTER — Other Ambulatory Visit: Payer: Self-pay | Admitting: Urology

## 2021-10-17 DIAGNOSIS — R791 Abnormal coagulation profile: Secondary | ICD-10-CM | POA: Diagnosis not present

## 2021-10-24 ENCOUNTER — Other Ambulatory Visit (HOSPITAL_BASED_OUTPATIENT_CLINIC_OR_DEPARTMENT_OTHER): Payer: Self-pay | Admitting: Family Medicine

## 2021-10-26 DIAGNOSIS — R42 Dizziness and giddiness: Secondary | ICD-10-CM | POA: Diagnosis not present

## 2021-10-26 DIAGNOSIS — M5441 Lumbago with sciatica, right side: Secondary | ICD-10-CM | POA: Diagnosis not present

## 2021-10-31 ENCOUNTER — Other Ambulatory Visit (HOSPITAL_BASED_OUTPATIENT_CLINIC_OR_DEPARTMENT_OTHER): Payer: Self-pay | Admitting: Family Medicine

## 2021-10-31 DIAGNOSIS — M542 Cervicalgia: Secondary | ICD-10-CM | POA: Diagnosis not present

## 2021-10-31 DIAGNOSIS — W19XXXA Unspecified fall, initial encounter: Secondary | ICD-10-CM | POA: Diagnosis not present

## 2021-10-31 DIAGNOSIS — M25532 Pain in left wrist: Secondary | ICD-10-CM | POA: Diagnosis not present

## 2021-10-31 DIAGNOSIS — M5430 Sciatica, unspecified side: Secondary | ICD-10-CM | POA: Diagnosis not present

## 2021-11-01 ENCOUNTER — Other Ambulatory Visit: Payer: Medicare Other

## 2021-11-07 DIAGNOSIS — M542 Cervicalgia: Secondary | ICD-10-CM | POA: Diagnosis not present

## 2021-11-07 DIAGNOSIS — W19XXXD Unspecified fall, subsequent encounter: Secondary | ICD-10-CM | POA: Diagnosis not present

## 2021-11-07 DIAGNOSIS — Z789 Other specified health status: Secondary | ICD-10-CM | POA: Diagnosis not present

## 2021-11-07 DIAGNOSIS — M25532 Pain in left wrist: Secondary | ICD-10-CM | POA: Diagnosis not present

## 2021-11-07 DIAGNOSIS — M549 Dorsalgia, unspecified: Secondary | ICD-10-CM | POA: Diagnosis not present

## 2021-11-08 ENCOUNTER — Ambulatory Visit: Payer: Medicare Other | Admitting: Urology

## 2021-11-09 DIAGNOSIS — W19XXXD Unspecified fall, subsequent encounter: Secondary | ICD-10-CM | POA: Diagnosis not present

## 2021-11-09 DIAGNOSIS — M549 Dorsalgia, unspecified: Secondary | ICD-10-CM | POA: Diagnosis not present

## 2021-11-09 DIAGNOSIS — M542 Cervicalgia: Secondary | ICD-10-CM | POA: Diagnosis not present

## 2021-11-09 DIAGNOSIS — M5440 Lumbago with sciatica, unspecified side: Secondary | ICD-10-CM | POA: Diagnosis not present

## 2021-11-09 DIAGNOSIS — Z0130 Encounter for examination of blood pressure without abnormal findings: Secondary | ICD-10-CM | POA: Diagnosis not present

## 2021-11-14 DIAGNOSIS — Z7901 Long term (current) use of anticoagulants: Secondary | ICD-10-CM | POA: Diagnosis not present

## 2021-11-15 DIAGNOSIS — M5416 Radiculopathy, lumbar region: Secondary | ICD-10-CM | POA: Diagnosis not present

## 2021-11-16 ENCOUNTER — Other Ambulatory Visit: Payer: Self-pay | Admitting: Student in an Organized Health Care Education/Training Program

## 2021-11-16 DIAGNOSIS — M5416 Radiculopathy, lumbar region: Secondary | ICD-10-CM

## 2021-11-21 ENCOUNTER — Other Ambulatory Visit (HOSPITAL_BASED_OUTPATIENT_CLINIC_OR_DEPARTMENT_OTHER): Payer: Self-pay | Admitting: Family Medicine

## 2021-11-30 DIAGNOSIS — Z7901 Long term (current) use of anticoagulants: Secondary | ICD-10-CM | POA: Diagnosis not present

## 2021-12-05 DIAGNOSIS — Z7901 Long term (current) use of anticoagulants: Secondary | ICD-10-CM | POA: Diagnosis not present

## 2021-12-12 DIAGNOSIS — M4326 Fusion of spine, lumbar region: Secondary | ICD-10-CM | POA: Diagnosis not present

## 2021-12-12 DIAGNOSIS — M5416 Radiculopathy, lumbar region: Secondary | ICD-10-CM | POA: Diagnosis not present

## 2021-12-12 DIAGNOSIS — M545 Low back pain, unspecified: Secondary | ICD-10-CM | POA: Diagnosis not present

## 2021-12-19 DIAGNOSIS — Z7901 Long term (current) use of anticoagulants: Secondary | ICD-10-CM | POA: Diagnosis not present

## 2021-12-20 ENCOUNTER — Other Ambulatory Visit: Payer: Medicare Other

## 2021-12-22 ENCOUNTER — Ambulatory Visit
Admission: RE | Admit: 2021-12-22 | Discharge: 2021-12-22 | Disposition: A | Discharge status: Home / Self Care | Payer: Medicare Other | Source: Ambulatory Visit | Attending: Student in an Organized Health Care Education/Training Program | Admitting: Student in an Organized Health Care Education/Training Program

## 2021-12-22 DIAGNOSIS — M48061 Spinal stenosis, lumbar region without neurogenic claudication: Secondary | ICD-10-CM | POA: Diagnosis not present

## 2021-12-22 DIAGNOSIS — M5136 Other intervertebral disc degeneration, lumbar region: Secondary | ICD-10-CM | POA: Diagnosis not present

## 2021-12-22 DIAGNOSIS — M5416 Radiculopathy, lumbar region: Secondary | ICD-10-CM

## 2021-12-22 DIAGNOSIS — M47816 Spondylosis without myelopathy or radiculopathy, lumbar region: Secondary | ICD-10-CM | POA: Diagnosis not present

## 2021-12-22 DIAGNOSIS — M545 Low back pain, unspecified: Secondary | ICD-10-CM | POA: Diagnosis not present

## 2021-12-25 DIAGNOSIS — M5416 Radiculopathy, lumbar region: Secondary | ICD-10-CM | POA: Diagnosis not present

## 2021-12-25 DIAGNOSIS — M4326 Fusion of spine, lumbar region: Secondary | ICD-10-CM | POA: Diagnosis not present

## 2021-12-28 DIAGNOSIS — C9001 Multiple myeloma in remission: Secondary | ICD-10-CM | POA: Diagnosis not present

## 2021-12-28 DIAGNOSIS — R6889 Other general symptoms and signs: Secondary | ICD-10-CM | POA: Diagnosis not present

## 2021-12-28 DIAGNOSIS — G5603 Carpal tunnel syndrome, bilateral upper limbs: Secondary | ICD-10-CM | POA: Diagnosis not present

## 2021-12-28 DIAGNOSIS — C9000 Multiple myeloma not having achieved remission: Secondary | ICD-10-CM | POA: Diagnosis not present

## 2022-01-03 DIAGNOSIS — Z7901 Long term (current) use of anticoagulants: Secondary | ICD-10-CM | POA: Diagnosis not present

## 2022-01-05 ENCOUNTER — Other Ambulatory Visit: Payer: Self-pay | Admitting: Urology

## 2022-01-05 DIAGNOSIS — N3281 Overactive bladder: Secondary | ICD-10-CM

## 2022-01-08 DIAGNOSIS — M7138 Other bursal cyst, other site: Secondary | ICD-10-CM | POA: Diagnosis not present

## 2022-01-15 ENCOUNTER — Other Ambulatory Visit: Payer: Self-pay | Admitting: Neurosurgery

## 2022-01-15 ENCOUNTER — Other Ambulatory Visit: Payer: Self-pay

## 2022-01-15 NOTE — Anesthesia Preprocedure Evaluation (Signed)
Anesthesia Evaluation  Patient identified by MRN, date of birth, ID band Patient awake    Reviewed: Allergy & Precautions, NPO status , Patient's Chart, lab work & pertinent test results  Airway Mallampati: II  TM Distance: >3 FB Neck ROM: Full    Dental  (+) Dental Advisory Given   Pulmonary former smoker   breath sounds clear to auscultation       Cardiovascular + dysrhythmias  Rhythm:Regular Rate:Normal     Neuro/Psych negative neurological ROS     GI/Hepatic Neg liver ROS,GERD  ,,  Endo/Other  Hypothyroidism    Renal/GU negative Renal ROS     Musculoskeletal  (+) Arthritis ,    Abdominal   Peds  Hematology  (+) Blood dyscrasia, anemia   Anesthesia Other Findings   Reproductive/Obstetrics                             Anesthesia Physical Anesthesia Plan  ASA: 3  Anesthesia Plan:    Post-op Pain Management: Tylenol PO (pre-op)*   Induction: Intravenous  PONV Risk Score and Plan: 1 and Dexamethasone, Ondansetron and Treatment may vary due to age or medical condition  Airway Management Planned: Oral ETT  Additional Equipment: None  Intra-op Plan:   Post-operative Plan: Extubation in OR  Informed Consent: I have reviewed the patients History and Physical, chart, labs and discussed the procedure including the risks, benefits and alternatives for the proposed anesthesia with the patient or authorized representative who has indicated his/her understanding and acceptance.     Dental advisory given  Plan Discussed with: CRNA  Anesthesia Plan Comments: (  )       Anesthesia Quick Evaluation

## 2022-01-15 NOTE — Progress Notes (Signed)
Anesthesia Chart Review: Rodney [name]  Case: [id number] Date/Time: 01/16/22 1422   Procedure: Right L5S1 Lam for synovial/facet cyst (Right) - 3C   Anesthesia type: General   Pre-op diagnosis: Synovial/Facet cyst, lumbar   Location: [facility] ROOM 20 / [facility]   Surgeons: Ashok Pall, MD       DISCUSSION: Patient is a 81 year old male scheduled for the above procedure.   History includes former smoker (quit 02/15/69), multiple myeloma (diagnosed ~ 2012; s/p chemoradiation, autologous stem cell transplant 11/21/11; lenalidomide discontinued 3/20202 after marrow confirmed sustained stringent complete response) , anemia, GERD, hypothyroidism, pancreatitis, clotting disorder with recurrent PE (post-op PE 06/2001; bilateral PE 07/24/10; per 08/01/10 note by Dr. Benay Pillow, "we referred him to a hematologist in the past a defined clotting disorder was not uncovered."), 2nd degree AV block (2014?), prostate cancer (radical retropubic prostatectomy 06/02/01), cholecystectomy (2012), spinal surgery (L3 vertebrectomy, L3 corpectomy, anterolateral arthrodesis in setting of multiple myeloma 01/05/11 followed by L2-L4 Posterolateral Arthrodesis, Morcellized allograft 01/09/11), left inguinal hernia repair (02/20/11).  I could not locate any specific details regarding 2nd degree AV block history, but he has had RBBB, LAD/LAFB, and borderline prolonged PR interval on EKGs dating back to at least 2016-2018. He had a nuclear stress test per his PCP > 10 year ago and was felt overall low risk. Of note, he is on Nitro patches, but review of records indicates that they are used for hip pain. 05/31/21 note from Dr. Irene Pap, "chronic pain on gabapentin 630m qtid and takes a quarter of a nitroglycerin patch every day and places it where he has pain on his hips and the outside of his feet daily with improvement for years (gets no dizziness)".  He fell about a month a go and imaging showed evidence of a subacute to acute T11  fracture. As of 10/26/2, oncologist Dr. VMelba Coondid not think this reflected myeloma disease progression and thought "his back symptoms are more related to worsening non-malignant causes of lumbar back pain exacerbated by the fall and radiculopathy."   He had labs through his [facility] oncologist on 12/28/21 (see Care Everywhere). He is for labs and EKG on arrival as indicated. Anesthesia team to evaluate on the day of surgery.   He reported holding warfarin since 01/10/22.    VS: 12/28/21 ([facility]): Blood Pressure 104/65 12/28/2021 12:52 PM EDT    Pulse 69 12/28/2021 12:52 PM EDT    Temperature 36.4 C (97.5 F) 12/28/2021 12:52 PM EDT    Respiratory Rate 18 12/28/2021 12:52 PM EDT    Oxygen Saturation 94% 12/28/2021 12:52 PM EDT    Inhaled Oxygen Concentration - -    Weight 94.6 kg (208 lb 9.6 oz) 12/28/2021 12:52 PM EDT    Height - -    Body Mass Index 30.8 09/18/2021 1:27 PM EDT     PROVIDERS: de [place] Raymond J, MD is PCP  VTerrilyn Saver MD is oncologist ([facility]). Last office visit 12/28/21.    LABS: For day of surgery as indicated. Labs on 12/28/21 at [facility] included CMP and CBC. Results included Na 141, K 4.4, glucose 69, BUN 23, Cr 1.35, AST 20, ALT 23, WBC 7.8, H/H 15.3/46, PLT 186. .Marland Kitchen   IMAGES: MRI L-spine 12/25/21: IMPRESSION: 1. New abnormal signal in the T11 vertebral body with oblique suspected fracture extending from the anterior vertebral body to the inferior endplate. This does not appear to involve the middle or posterior columns. This fracture is likely acute or  subacute. 2. Accentuated edema along the posterior endplates of L4 and L5, possible Schmorl's node or even a small fracture along the posterosuperior endplate of L5. 3. Lumbar spondylosis and degenerative disc disease, causing new severe impingement at L4-5 and L5-S1. A right-sided synovial cyst contributes to the impingement at the L5-S1 level. 4. Prior chronic corpectomy at L3 with expected  postoperative findings. 5. Prominence of fatty marrow in the sacrum at a site of prior lytic fluid signal intensity, likely related to treatment of prior myelomatous lesion. I do not see compelling findings of active myeloma.   EKG: EKG tracing 12/19/16: Sinus  Rhythm  - Right bundle branch block with left axis -bifascicular block.  - Borderline first degree AV block (PR 208 ms)   CV: Nuclear stress test 06/12/11: Overall Impression:  Low risk stress nuclear study. Small area of mild apical ischemia LV Ejection Fraction: 62%.  LV Wall Motion:  NL LV Function; NL Wall Motion - Per Dr. Benay Pillow, "Patient had a low-risk nuclear study please inform him of the results". Baseline EKG showed NSR, RBBB.   Past Medical History:  Diagnosis Date   Anemia 02-16-11   01-05-11- post surgery-Transfusions x 4 units   Arthritis    Bone pain 02/18/2013   Cellulitis    Clotting disorder ([place])    Complication of anesthesia 02-16-11   Pt. speaks of awakening during the surgery from back  surgery   Degenerative disc disease 02-16-11   01-05-11 L3 fusion done   Esophageal reflux 05/31/2008   Qualifier: Diagnosis of  By: Arnoldo Morale MD, Balinda Quails    Hernia 02-16-11   left inguinal hernia at present   Hypothyroidism 02/01/2014   Multiple myeloma 02-16-11   suspected tumor  left sacral   Pancreatitis    Personal history of traumatic fracture 07/10/2012   Prostate cancer [facility])    Pulmonary embolism ([place])    Second degree atrioventricular block 11/26/2012   Shortness of breath 02-16-11   hx. Pulmonary emboli x2 (9 yrs/ 3'12 -last)    Past Surgical History:  Procedure Laterality Date   APPENDECTOMY     CATARACT EXTRACTION     CHOLECYSTECTOMY  04/06/2010   laparoscopic-inflammation with stones   INGUINAL HERNIA REPAIR  02/20/2011   Procedure: HERNIA REPAIR INGUINAL ADULT;  Surgeon: Earnstine Regal, MD;  Location: [facility];  Service: General;  Laterality: Left;  Repair Left Inguinal Hernia with Mesh    LUMBAR [place] SURGERY  02-16-11    01-05-11 Lumbar surgery L3(complicated by loss of blood volume)/ 01-09-11 then Lumbar fusion done with retained  hardware    PROSTATE SURGERY Bilateral 2011   TONSILLECTOMY AND ADENOIDECTOMY      MEDICATIONS: No current facility-administered medications for this encounter.    Biotin 5 MG TBDP   calcium carbonate (TUMS - DOSED IN MG ELEMENTAL CALCIUM) 500 MG chewable tablet   Calcium Carbonate-Vitamin D (CALCIUM-D PO)   cetirizine (ZYRTEC) 10 MG chewable tablet   Cholecalciferol (VITAMIN D) 1000 UNITS capsule   ciclopirox (PENLAC) 8 % solution   citalopram (CELEXA) 10 MG tablet   Cyanocobalamin (VITAMIN B12 PO)   diclofenac Sodium (VOLTAREN) 1 % GEL   gabapentin (NEURONTIN) 300 MG capsule   levothyroxine (SYNTHROID) 100 MCG tablet   methylPREDNISolone (MEDROL DOSEPAK) 4 MG TBPK tablet   Multiple Vitamins-Minerals (MULTIVITAMIN ADULT PO)   MYRBETRIQ 25 MG TB24 tablet   nitroGLYCERIN (NITRODUR - DOSED IN MG/24 HR) 0.2 mg/hr patch   omeprazole (PRILOSEC) 20 MG capsule  ondansetron (ZOFRAN) 4 MG tablet   Potassium Chloride ER 20 MEQ TBCR   potassium chloride SA (KLOR-CON M) 20 MEQ tablet   tolterodine (DETROL LA) 4 MG 24 hr capsule   triamcinolone (NASACORT) 55 MCG/ACT AERO nasal inhaler   warfarin (COUMADIN) 5 MG tablet    Myra Gianotti, PA-C Surgical Short Stay/Anesthesiology [facility] Phone [phone] [facility] Phone [phone] 01/15/2022 4:05 PM

## 2022-01-15 NOTE — Pre-Procedure Instructions (Signed)
PCP - patient unsure of doctor's name Cardiologist - denies "Cancer doctor" in [place] Chest x-ray - 03/23/20 ECHO - 2012 Stress- 2013 Cardiac Cath - denies Blood Thinner Instructions: warfarin- patient stopped taking Wednesday ERAS Protcol - yes, no drink  Anesthesia review: yes- cancer- multiple myeloma  -------------  SDW INSTRUCTIONS:  Your procedure is scheduled on Tuesday November 14. Please report to [facility] Main Entrance "A" at 12:00 noon., and check in at the Admitting office. Call this number if you have problems the morning of surgery: [phone]   Remember: Do not eat after midnight the night before your surgery  You may drink clear liquids until 11:30 the morning of your surgery.   Clear liquids allowed are: Water, Non-Citrus Juices (without pulp), Carbonated Beverages, Clear Tea, Black Coffee Only, and Gatorade   Medications to take morning of surgery with a sip of water include: Celexa, levothyroxine, methylprednisolone, prilosec, tolterodine, nasocort  DO NOT TAKE VOLTAREN or WARFARIN  As of today, STOP taking any Aspirin (unless otherwise instructed by your surgeon), Aleve, Naproxen, Ibuprofen, Motrin, Advil, Goody's, BC's, all herbal medications, fish oil, and all vitamins.    The Morning of Surgery Do not wear jewelry, make-up or nail polish. Do not wear lotions, powders, colognes, or deodorant Do not bring valuables to the hospital. [facility] is not responsible for any belongings or valuables.  If you are a smoker, DO NOT Smoke 24 hours prior to surgery  If you wear a CPAP at night please bring your mask the morning of surgery   Remember that you must have someone to transport you home after your surgery, and remain with you for 24 hours if you are discharged the same day.  Please bring cases for contacts, glasses, hearing aids, dentures or bridgework because it cannot be worn into surgery.   Patients discharged the day of surgery  will not be allowed to drive home.   Please shower the NIGHT BEFORE/MORNING OF SURGERY (use antibacterial soap like DIAL soap if possible). Wear comfortable clothes the morning of surgery. Oral Hygiene is also important to reduce your risk of infection.  Remember - BRUSH YOUR TEETH THE MORNING OF SURGERY WITH YOUR REGULAR TOOTHPASTE  Patient denies shortness of breath, fever, cough and chest pain.

## 2022-01-16 ENCOUNTER — Other Ambulatory Visit: Payer: Self-pay

## 2022-01-16 ENCOUNTER — Encounter (HOSPITAL_COMMUNITY): Payer: Self-pay | Admitting: Neurosurgery

## 2022-01-16 ENCOUNTER — Encounter (HOSPITAL_COMMUNITY)
Admission: RE | Disposition: A | Discharge status: Home / Self Care | Payer: Self-pay | Source: Home / Self Care | Attending: Neurosurgery

## 2022-01-16 ENCOUNTER — Ambulatory Visit (HOSPITAL_BASED_OUTPATIENT_CLINIC_OR_DEPARTMENT_OTHER): Payer: Medicare Other | Admitting: Certified Registered Nurse Anesthetist

## 2022-01-16 ENCOUNTER — Observation Stay (HOSPITAL_COMMUNITY)
Admission: RE | Admit: 2022-01-16 | Discharge: 2022-01-17 | Disposition: A | Discharge status: Home / Self Care | Payer: Medicare Other | Attending: Neurosurgery | Admitting: Neurosurgery

## 2022-01-16 ENCOUNTER — Ambulatory Visit (HOSPITAL_COMMUNITY): Payer: Medicare Other | Admitting: Certified Registered Nurse Anesthetist

## 2022-01-16 ENCOUNTER — Ambulatory Visit (HOSPITAL_COMMUNITY): Payer: Medicare Other

## 2022-01-16 DIAGNOSIS — M7138 Other bursal cyst, other site: Principal | ICD-10-CM | POA: Insufficient documentation

## 2022-01-16 DIAGNOSIS — D649 Anemia, unspecified: Secondary | ICD-10-CM | POA: Diagnosis not present

## 2022-01-16 DIAGNOSIS — Z7901 Long term (current) use of anticoagulants: Secondary | ICD-10-CM | POA: Insufficient documentation

## 2022-01-16 DIAGNOSIS — E039 Hypothyroidism, unspecified: Secondary | ICD-10-CM | POA: Diagnosis not present

## 2022-01-16 DIAGNOSIS — D638 Anemia in other chronic diseases classified elsewhere: Secondary | ICD-10-CM | POA: Diagnosis not present

## 2022-01-16 DIAGNOSIS — Z86711 Personal history of pulmonary embolism: Secondary | ICD-10-CM | POA: Diagnosis not present

## 2022-01-16 DIAGNOSIS — M1990 Unspecified osteoarthritis, unspecified site: Secondary | ICD-10-CM | POA: Diagnosis not present

## 2022-01-16 DIAGNOSIS — Z9889 Other specified postprocedural states: Secondary | ICD-10-CM | POA: Diagnosis not present

## 2022-01-16 HISTORY — PX: LUMBAR LAMINECTOMY/DECOMPRESSION MICRODISCECTOMY: SHX5026

## 2022-01-16 LAB — CBC
HCT: 42.8 % (ref 39.0–52.0)
Hemoglobin: 14.3 g/dL (ref 13.0–17.0)
MCH: 32.4 pg (ref 26.0–34.0)
MCHC: 33.4 g/dL (ref 30.0–36.0)
MCV: 96.8 fL (ref 80.0–100.0)
Platelets: 162 10*3/uL (ref 150–400)
RBC: 4.42 MIL/uL (ref 4.22–5.81)
RDW: 13.2 % (ref 11.5–15.5)
WBC: 9.4 10*3/uL (ref 4.0–10.5)
nRBC: 0 % (ref 0.0–0.2)

## 2022-01-16 LAB — CREATININE, SERUM
Creatinine, Ser: 1.14 mg/dL (ref 0.61–1.24)
GFR, Estimated: >60 mL/min (ref >60)

## 2022-01-16 LAB — PROTIME-INR
INR: 1.2 (ref 0.8–1.2)
Prothrombin Time: 15.2 s (ref 11.4–15.2)

## 2022-01-16 LAB — SURGICAL PCR SCREEN
MRSA, PCR: NEGATIVE
Staphylococcus aureus: POSITIVE — AB

## 2022-01-16 LAB — APTT: aPTT: 27 seconds (ref 24–36)

## 2022-01-16 SURGERY — LUMBAR LAMINECTOMY/DECOMPRESSION MICRODISCECTOMY 1 LEVEL
Anesthesia: General | Laterality: Right

## 2022-01-16 MED ORDER — BUPIVACAINE HCL 0.5 % IJ SOLN
INTRAMUSCULAR | Status: DC | PRN
  Administered 2022-01-16: 10 mL

## 2022-01-16 MED ORDER — CHLORHEXIDINE GLUCONATE CLOTH 2 % EX PADS: 6.0000 | MEDICATED_PAD | Freq: Once | CUTANEOUS | Status: DC

## 2022-01-16 MED ORDER — POTASSIUM CHLORIDE IN NACL 20-0.9 MEQ/L-% IV SOLN: INTRAVENOUS | Status: DC

## 2022-01-16 MED ORDER — FENTANYL CITRATE (PF) 250 MCG/5ML IJ SOLN
INTRAMUSCULAR | Status: AC
  Filled 2022-01-16: qty 5

## 2022-01-16 MED ORDER — ACETAMINOPHEN 650 MG RE SUPP: 650.0000 mg | RECTAL | Status: DC | PRN

## 2022-01-16 MED ORDER — CELECOXIB 200 MG PO CAPS
200.0000 mg | ORAL_CAPSULE | Freq: Two times a day (BID) | ORAL | Status: DC
  Administered 2022-01-16 – 2022-01-17 (×2): 200 mg via ORAL
  Filled 2022-01-16 (×2): qty 1

## 2022-01-16 MED ORDER — HEPARIN SODIUM (PORCINE) 5000 UNIT/ML IJ SOLN
5000.0000 [IU] | Freq: Three times a day (TID) | INTRAMUSCULAR | Status: DC
  Administered 2022-01-16 – 2022-01-17 (×2): 5000 [IU] via SUBCUTANEOUS
  Filled 2022-01-16 (×2): qty 1

## 2022-01-16 MED ORDER — THROMBIN 5000 UNITS EX SOLR
CUTANEOUS | Status: AC
  Filled 2022-01-16: qty 5000

## 2022-01-16 MED ORDER — PANTOPRAZOLE SODIUM 40 MG PO TBEC
40.0000 mg | DELAYED_RELEASE_TABLET | Freq: Every day | ORAL | Status: DC
  Administered 2022-01-16 – 2022-01-17 (×2): 40 mg via ORAL
  Filled 2022-01-16 (×2): qty 1

## 2022-01-16 MED ORDER — MORPHINE SULFATE (PF) 2 MG/ML IV SOLN: 1.0000 mg | INTRAVENOUS | Status: DC | PRN

## 2022-01-16 MED ORDER — LIDOCAINE 2% (20 MG/ML) 5 ML SYRINGE
INTRAMUSCULAR | Status: DC | PRN
  Administered 2022-01-16: 60 mg via INTRAVENOUS

## 2022-01-16 MED ORDER — LACTATED RINGERS IV SOLN: INTRAVENOUS | Status: DC

## 2022-01-16 MED ORDER — OXYCODONE HCL 5 MG PO TABS: 10.0000 mg | ORAL_TABLET | ORAL | Status: DC | PRN

## 2022-01-16 MED ORDER — CEFAZOLIN SODIUM-DEXTROSE 2-4 GM/100ML-% IV SOLN
2.0000 g | INTRAVENOUS | Status: AC
  Administered 2022-01-16: 2 g via INTRAVENOUS

## 2022-01-16 MED ORDER — LIDOCAINE-EPINEPHRINE 0.5 %-1:200000 IJ SOLN
INTRAMUSCULAR | Status: DC | PRN
  Administered 2022-01-16: 6 mL via INTRADERMAL

## 2022-01-16 MED ORDER — DIAZEPAM 5 MG PO TABS
5.0000 mg | ORAL_TABLET | Freq: Four times a day (QID) | ORAL | Status: DC | PRN
  Administered 2022-01-16: 5 mg via ORAL
  Filled 2022-01-16: qty 1

## 2022-01-16 MED ORDER — ROCURONIUM BROMIDE 10 MG/ML (PF) SYRINGE
PREFILLED_SYRINGE | INTRAVENOUS | Status: DC | PRN
  Administered 2022-01-16: 100 mg via INTRAVENOUS

## 2022-01-16 MED ORDER — ONDANSETRON HCL 4 MG/2ML IJ SOLN: 4.0000 mg | Freq: Four times a day (QID) | INTRAMUSCULAR | Status: DC | PRN

## 2022-01-16 MED ORDER — OXYCODONE HCL 5 MG PO TABS: 5.0000 mg | ORAL_TABLET | ORAL | Status: DC | PRN

## 2022-01-16 MED ORDER — PROSIGHT PO TABS
1.0000 | ORAL_TABLET | Freq: Every day | ORAL | Status: DC
  Administered 2022-01-16 – 2022-01-17 (×2): 1 via ORAL
  Filled 2022-01-16 (×2): qty 1

## 2022-01-16 MED ORDER — THROMBIN 5000 UNITS EX SOLR
CUTANEOUS | Status: DC | PRN
  Administered 2022-01-16: 10000 [IU] via TOPICAL

## 2022-01-16 MED ORDER — AMISULPRIDE (ANTIEMETIC) 5 MG/2ML IV SOLN: 10.0000 mg | Freq: Once | INTRAVENOUS | Status: DC | PRN

## 2022-01-16 MED ORDER — ROCURONIUM BROMIDE 10 MG/ML (PF) SYRINGE
PREFILLED_SYRINGE | INTRAVENOUS | Status: AC
  Filled 2022-01-16: qty 10

## 2022-01-16 MED ORDER — SODIUM CHLORIDE 0.9 % IV SOLN: 250.0000 mL | INTRAVENOUS | Status: DC

## 2022-01-16 MED ORDER — FESOTERODINE FUMARATE ER 4 MG PO TB24
4.0000 mg | ORAL_TABLET | Freq: Every day | ORAL | Status: DC
  Administered 2022-01-16 – 2022-01-17 (×2): 4 mg via ORAL
  Filled 2022-01-16 (×2): qty 1

## 2022-01-16 MED ORDER — EPHEDRINE SULFATE (PRESSORS) 50 MG/ML IJ SOLN
INTRAMUSCULAR | Status: DC | PRN
  Administered 2022-01-16: 5 mg via INTRAVENOUS

## 2022-01-16 MED ORDER — EPHEDRINE 5 MG/ML INJ
INTRAVENOUS | Status: AC
  Filled 2022-01-16: qty 5

## 2022-01-16 MED ORDER — PHENYLEPHRINE HCL (PRESSORS) 10 MG/ML IV SOLN
INTRAVENOUS | Status: DC | PRN
  Administered 2022-01-16 (×5): 80 ug via INTRAVENOUS
  Administered 2022-01-16: 160 ug via INTRAVENOUS
  Administered 2022-01-16: 80 ug via INTRAVENOUS

## 2022-01-16 MED ORDER — SODIUM CHLORIDE 0.9% FLUSH: 3.0000 mL | INTRAVENOUS | Status: DC | PRN

## 2022-01-16 MED ORDER — 0.9 % SODIUM CHLORIDE (POUR BTL) OPTIME
TOPICAL | Status: DC | PRN
  Administered 2022-01-16: 1000 mL

## 2022-01-16 MED ORDER — ONDANSETRON HCL 4 MG/2ML IJ SOLN
INTRAMUSCULAR | Status: DC | PRN
  Administered 2022-01-16: 4 mg via INTRAVENOUS

## 2022-01-16 MED ORDER — MENTHOL 3 MG MT LOZG: 1.0000 | LOZENGE | OROMUCOSAL | Status: DC | PRN

## 2022-01-16 MED ORDER — ORAL CARE MOUTH RINSE: 15.0000 mL | Freq: Once | OROMUCOSAL | Status: AC

## 2022-01-16 MED ORDER — LEVOTHYROXINE SODIUM 112 MCG PO TABS
112.0000 ug | ORAL_TABLET | Freq: Every morning | ORAL | Status: DC
  Administered 2022-01-17: 112 ug via ORAL
  Filled 2022-01-16 (×2): qty 1

## 2022-01-16 MED ORDER — ONDANSETRON HCL 4 MG PO TABS: 4.0000 mg | ORAL_TABLET | Freq: Four times a day (QID) | ORAL | Status: DC | PRN

## 2022-01-16 MED ORDER — CHLORHEXIDINE GLUCONATE 0.12 % MT SOLN
15.0000 mL | Freq: Once | OROMUCOSAL | Status: AC
  Administered 2022-01-16: 15 mL via OROMUCOSAL
  Filled 2022-01-16: qty 15

## 2022-01-16 MED ORDER — HYDROMORPHONE HCL 1 MG/ML IJ SOLN: 0.2500 mg | INTRAMUSCULAR | Status: DC | PRN

## 2022-01-16 MED ORDER — LIDOCAINE 2% (20 MG/ML) 5 ML SYRINGE
INTRAMUSCULAR | Status: AC
  Filled 2022-01-16: qty 5

## 2022-01-16 MED ORDER — CEFAZOLIN SODIUM-DEXTROSE 2-4 GM/100ML-% IV SOLN
INTRAVENOUS | Status: AC
  Filled 2022-01-16: qty 100

## 2022-01-16 MED ORDER — ESTER-C 500-550 MG PO TABS: ORAL_TABLET | Freq: Two times a day (BID) | ORAL | Status: DC

## 2022-01-16 MED ORDER — ACETAMINOPHEN 325 MG PO TABS: 650.0000 mg | ORAL_TABLET | ORAL | Status: DC | PRN

## 2022-01-16 MED ORDER — HYDROCODONE-ACETAMINOPHEN 7.5-325 MG PO TABS
1.0000 | ORAL_TABLET | Freq: Four times a day (QID) | ORAL | Status: DC
  Administered 2022-01-16 – 2022-01-17 (×4): 1 via ORAL
  Filled 2022-01-16 (×4): qty 1

## 2022-01-16 MED ORDER — SUGAMMADEX SODIUM 200 MG/2ML IV SOLN
INTRAVENOUS | Status: DC | PRN
  Administered 2022-01-16: 179.6 mg via INTRAVENOUS

## 2022-01-16 MED ORDER — PROPOFOL 10 MG/ML IV BOLUS
INTRAVENOUS | Status: AC
  Filled 2022-01-16: qty 20

## 2022-01-16 MED ORDER — NITROGLYCERIN 0.2 MG/HR TD PT24: 0.2000 mg | MEDICATED_PATCH | Freq: Every day | TRANSDERMAL | Status: DC | PRN

## 2022-01-16 MED ORDER — SODIUM CHLORIDE 0.9% FLUSH: 3.0000 mL | Freq: Two times a day (BID) | INTRAVENOUS | Status: DC

## 2022-01-16 MED ORDER — EYE MULTIVITAMIN/LUTEIN PO TABS: 1.0000 | ORAL_TABLET | Freq: Every morning | ORAL | Status: DC

## 2022-01-16 MED ORDER — PHENOL 1.4 % MT LIQD: 1.0000 | OROMUCOSAL | Status: DC | PRN

## 2022-01-16 MED ORDER — PROPOFOL 10 MG/ML IV BOLUS
INTRAVENOUS | Status: DC | PRN
  Administered 2022-01-16: 100 mg via INTRAVENOUS

## 2022-01-16 MED ORDER — BUPIVACAINE HCL (PF) 0.5 % IJ SOLN
INTRAMUSCULAR | Status: AC
  Filled 2022-01-16: qty 30

## 2022-01-16 MED ORDER — ZOLPIDEM TARTRATE 5 MG PO TABS: 5.0000 mg | ORAL_TABLET | Freq: Every evening | ORAL | Status: DC | PRN

## 2022-01-16 MED ORDER — PHENYLEPHRINE HCL-NACL 20-0.9 MG/250ML-% IV SOLN
INTRAVENOUS | Status: DC | PRN
  Administered 2022-01-16: 20 ug/min via INTRAVENOUS

## 2022-01-16 MED ORDER — FENTANYL CITRATE (PF) 250 MCG/5ML IJ SOLN
INTRAMUSCULAR | Status: DC | PRN
  Administered 2022-01-16: 50 ug via INTRAVENOUS
  Administered 2022-01-16: 25 ug via INTRAVENOUS
  Administered 2022-01-16: 50 ug via INTRAVENOUS

## 2022-01-16 MED ORDER — PHENYLEPHRINE 80 MCG/ML (10ML) SYRINGE FOR IV PUSH (FOR BLOOD PRESSURE SUPPORT)
PREFILLED_SYRINGE | INTRAVENOUS | Status: AC
  Filled 2022-01-16: qty 10

## 2022-01-16 MED ORDER — LIDOCAINE-EPINEPHRINE 0.5 %-1:200000 IJ SOLN
INTRAMUSCULAR | Status: AC
  Filled 2022-01-16: qty 1

## 2022-01-16 MED ORDER — GABAPENTIN 300 MG PO CAPS
600.0000 mg | ORAL_CAPSULE | Freq: Every day | ORAL | Status: DC
  Administered 2022-01-16: 600 mg via ORAL
  Filled 2022-01-16: qty 2

## 2022-01-16 SURGICAL SUPPLY — 43 items
BAG COUNTER SPONGE SURGICOUNT (BAG) ×1 IMPLANT
BAND RUBBER #18 3X1/16 STRL (MISCELLANEOUS) ×2 IMPLANT
BENZOIN TINCTURE PRP APPL 2/3 (GAUZE/BANDAGES/DRESSINGS) IMPLANT
BLADE CLIPPER SURG (BLADE) IMPLANT
BUR MATCHSTICK NEURO 3.0 LAGG (BURR) ×1 IMPLANT
BUR PRECISION FLUTE 5.0 (BURR) IMPLANT
CANISTER SUCT 3000ML PPV (MISCELLANEOUS) ×1 IMPLANT
DERMABOND ADVANCED .7 DNX12 (GAUZE/BANDAGES/DRESSINGS) ×1 IMPLANT
DRAPE LAPAROTOMY 100X72X124 (DRAPES) ×1 IMPLANT
DRAPE MICROSCOPE SLANT 54X150 (MISCELLANEOUS) ×1 IMPLANT
DRAPE SURG 17X23 STRL (DRAPES) ×1 IMPLANT
DURAPREP 26ML APPLICATOR (WOUND CARE) ×1 IMPLANT
ELECT REM PT RETURN 9FT ADLT (ELECTROSURGICAL) ×1
ELECTRODE REM PT RTRN 9FT ADLT (ELECTROSURGICAL) ×1 IMPLANT
GAUZE 4X4 16PLY ~~LOC~~+RFID DBL (SPONGE) IMPLANT
GAUZE SPONGE 4X4 12PLY STRL (GAUZE/BANDAGES/DRESSINGS) IMPLANT
GLOVE ECLIPSE 6.5 STRL STRAW (GLOVE) ×1 IMPLANT
GLOVE EXAM NITRILE XL STR (GLOVE) IMPLANT
GOWN STRL REUS W/ TWL LRG LVL3 (GOWN DISPOSABLE) ×2 IMPLANT
GOWN STRL REUS W/ TWL XL LVL3 (GOWN DISPOSABLE) IMPLANT
GOWN STRL REUS W/TWL 2XL LVL3 (GOWN DISPOSABLE) IMPLANT
GOWN STRL REUS W/TWL LRG LVL3 (GOWN DISPOSABLE) ×2
GOWN STRL REUS W/TWL XL LVL3 (GOWN DISPOSABLE) ×1
KIT BASIN OR (CUSTOM PROCEDURE TRAY) ×1 IMPLANT
KIT TURNOVER KIT B (KITS) ×1 IMPLANT
NDL HYPO 25X1 1.5 SAFETY (NEEDLE) ×1 IMPLANT
NDL SPNL 18GX3.5 QUINCKE PK (NEEDLE) IMPLANT
NEEDLE HYPO 25X1 1.5 SAFETY (NEEDLE) ×1 IMPLANT
NEEDLE SPNL 18GX3.5 QUINCKE PK (NEEDLE) ×1 IMPLANT
NS IRRIG 1000ML POUR BTL (IV SOLUTION) ×1 IMPLANT
PACK LAMINECTOMY NEURO (CUSTOM PROCEDURE TRAY) ×1 IMPLANT
PAD ARMBOARD 7.5X6 YLW CONV (MISCELLANEOUS) ×3 IMPLANT
SPIKE FLUID TRANSFER (MISCELLANEOUS) ×1 IMPLANT
SPONGE SURGIFOAM ABS GEL SZ50 (HEMOSTASIS) ×1 IMPLANT
SPONGE T-LAP 4X18 ~~LOC~~+RFID (SPONGE) IMPLANT
STRIP CLOSURE SKIN 1/2X4 (GAUZE/BANDAGES/DRESSINGS) IMPLANT
SUT VIC AB 0 CT1 18XCR BRD8 (SUTURE) ×1 IMPLANT
SUT VIC AB 0 CT1 8-18 (SUTURE) ×2
SUT VIC AB 2-0 CT1 18 (SUTURE) ×1 IMPLANT
SUT VIC AB 3-0 SH 8-18 (SUTURE) ×1 IMPLANT
TOWEL GREEN STERILE (TOWEL DISPOSABLE) ×1 IMPLANT
TOWEL GREEN STERILE FF (TOWEL DISPOSABLE) ×1 IMPLANT
WATER STERILE IRR 1000ML POUR (IV SOLUTION) ×1 IMPLANT

## 2022-01-16 NOTE — Anesthesia Procedure Notes (Signed)
Procedure Name: Intubation Date/Time: 01/16/2022 3:10 PM  Performed by: Maude Leriche, CRNAPre-anesthesia Checklist: Patient identified, Emergency Drugs available, Suction available and Patient being monitored Patient Re-evaluated:Patient Re-evaluated prior to induction Oxygen Delivery Method: Circle system utilized Preoxygenation: Pre-oxygenation with 100% oxygen Induction Type: IV induction Ventilation: Mask ventilation without difficulty Laryngoscope Size: Miller and 2 Grade View: Grade I Tube type: Oral Tube size: 7.5 mm Number of attempts: 1 Airway Equipment and Method: Bite block Placement Confirmation: ETT inserted through vocal cords under direct vision, positive ETCO2 and breath sounds checked- equal and bilateral Secured at: 23 cm Tube secured with: Tape Dental Injury: Teeth and Oropharynx as per pre-operative assessment

## 2022-01-16 NOTE — Op Note (Signed)
01/16/2022  5:31 PM  PATIENT:  Rodney [name]  81 y.o. male With severe pain in the right lower extremity. MRI revealed a synovial cyst compressing the S1 nerve root and thecal sac PRE-OPERATIVE DIAGNOSIS:  Synovial/Facet cyst, lumbar right L5/S1  POST-OPERATIVE DIAGNOSIS: same PROCEDURE:  Procedure(s): Right Lumbar Five- Sacral One Laminectomy for synovial/facet cyst  SURGEON:   Surgeon(s): Ashok Pall, MD Vallarie Mare, MD  ASSISTANTS:thomas, jonathan  ANESTHESIA:   general  EBL:  Total I/O In: 800 [I.V.:800] Out: 30 [Blood:30]  BLOOD ADMINISTERED:none  CELL SAVER GIVEN:na  COUNT:per nursing  DRAINS: none   SPECIMEN:  No Specimen  DICTATION: [name] was taken to the operating room, intubated and placed under a general anesthetic without difficulty. He was positioned prone on a Wilson frame with all pressure points padded. His back was prepped and draped in a sterile manner. I opened the skin with a 10 blade and carried the dissection down to the thoracolumbar fascia. I used both sharp dissection and the monopolar cautery to expose the lamina of L5, and S1. I confirmed my location with an intraoperative xray.  I used the drill, Kerrison punches, and curettes to perform a semihemilaminectomy of L5. I used the punches to remove the ligamentum flavum to expose the thecal sac. I brought the microscope into the operative field and with Dr.Thomas' assistance we started our decompression of the spinal canal, thecal sac and L5, and S1 root(s). I entered the cyst and removed a large amount of greyish material. The capsule of the cyst was further dissected from the thecal sac and separated. We used Kerrison punches to remove the capsule. We did not violate the dura.  We inspected the thecal sac and S1 nerve root and felt it was well decompressed. I explored rostrally, laterally, medially, and caudally and was satisfied with the decompression. I irrigated the wound, then  closed in layers. I approximated the thoracolumbar fascia, subcutaneous, and subcuticular planes with vicryl sutures. I used dermabond for a sterile dressing.   PLAN OF CARE: Admit for overnight observation  PATIENT DISPOSITION:  PACU - hemodynamically stable.   Delay start of Pharmacological VTE agent (>24hrs) due to surgical blood loss or risk of bleeding:  no

## 2022-01-16 NOTE — H&P (Signed)
BP 116/78   Pulse 61   Temp 98.2 F (36.8 C) (Oral)   Resp 17   Ht _0  (1.803 m)   Wt 89.8 kg   SpO2 96%   BMI 27.62 kg/m   Mr. Galyean is a long-time patient of mine, whom I took to the operating room in 2014 for a multiple myeloma, which was located at L3.  I did a corpectomy of L3 and then fusion from L2 to L4, and he is now in complete remission.  I had not seen him for years, but he was sent to me again because he started having very severe back pain, especially in the right lower extremity.  He happened to take a fall, but he was hurting prior to the fall.  There are a number of issues on the MRI scan, all of which could be causing this.  One, he has a T11 fracture which they felt was acute to subacute, but his pain certainly is extending into the right lower extremity.  He has marked stenosis at L4-5 below the level of the arthrodesis, and he has a humongous synovial cyst on the right side at L5-S1.  Given that his pain is on the right, and given that if I operated at 4-5, I would have to extend the fusion, I certainly could not leave 5-1, so I would be fusing him now from the sacrum to L2.  I think that the synovial cyst is the proximate cause of the pain, and I explained that we may have to come back to do more operating, but I really do want to take a shot, just take out the synovial cyst, which does not obligate me to arthrodesis that level, and then see how he does afterward.  He weighs 199 pounds.  Temperature is 98.6, blood pressure 108/66, pulse 64.  Pain is 8/10.  Medications include Ester-C, Acetaminophen, Biotin, Caltrate plus Vitamin D, Cetirizine, Cholecalciferol, Citalopram, Cyanocobalamin, Gabapentin, Lenalidomide, Levothyroxine, Nitroglycerin, and Omeprazole. He is taking Potassium.     The MRI was actually ordered because he fell going into [facility] because he wanted somebody to check his coagulation numbers, and as a result of that, they ordered the MRI.     PHYSICAL  EXAMINATION :  He is alert, oriented x4.  He answers all questions appropriately.  Memory, language, attention span, and fund of knowledge are normal.  He walks slightly stooped.  Muscle tone, bulk, coordination are normal.  Speech is clear, it is also fluent.  Pupils equal, round, and reactive to light.  Full extraocular movements.  Full visual fields.  2+ reflexes at the knees and ankles.  Normal muscle tone and bulk.     PLAN :  I will have Mr. Tomes stop taking his Coumadin today, and hopefully he can be ready for a procedure on Thursday.  I will plan on doing this at the surgical center and then certainly can release him on Lovenox and allow him to restart the Coumadin.     Mr. Vastine is 81 years of age, right-handed.  He does not smoke.  No history of substance abuse.  He does not use alcohol.  Medications were given.  Mother and Father both deceased.  He has had blood clots present in the family history.  He has generalized weakness.  He says the pain comes and goes more often. Numbness and tingling in the right leg and buttock.  He has had some bowel and bladder dysfunction.  REVIEW OF SYSTEMS :  Positive for tinnitus, balance problems, sinus problems, leg pain with walking, arthritis, leg weakness, back pain, leg pain.

## 2022-01-16 NOTE — Transfer of Care (Signed)
Immediate Anesthesia Transfer of Care Note  Patient: Rodney [name]  Procedure(s) Performed: Right Lumbar Five- Sacral One Laminectomy for synovial/facet cyst (Right)  Patient Location: PACU  Anesthesia Type:General  Level of Consciousness: awake, alert , and oriented  Airway & Oxygen Therapy: Patient Spontanous Breathing and Patient connected to face mask oxygen  Post-op Assessment: Report given to RN, Post -op Vital signs reviewed and stable, Patient moving all extremities X 4, and Patient able to stick tongue midline  Post vital signs: Reviewed  Last Vitals:  Vitals Value Taken Time  BP 146/76   Temp 97.7   Pulse 65 01/16/22 1712  Resp 11   SpO2 98 % 01/16/22 1712  Vitals shown include unvalidated device data.  Last Pain:  Vitals:   01/16/22 1231  TempSrc: Oral         Complications: No notable events documented.

## 2022-01-17 ENCOUNTER — Encounter (HOSPITAL_COMMUNITY): Payer: Self-pay | Admitting: Neurosurgery

## 2022-01-17 DIAGNOSIS — M7138 Other bursal cyst, other site: Secondary | ICD-10-CM | POA: Diagnosis not present

## 2022-01-17 DIAGNOSIS — Z7901 Long term (current) use of anticoagulants: Secondary | ICD-10-CM | POA: Diagnosis not present

## 2022-01-17 DIAGNOSIS — D649 Anemia, unspecified: Secondary | ICD-10-CM | POA: Diagnosis not present

## 2022-01-17 DIAGNOSIS — Z86711 Personal history of pulmonary embolism: Secondary | ICD-10-CM | POA: Diagnosis not present

## 2022-01-17 MED ORDER — OXYCODONE HCL 5 MG PO TABS: 5.0000 mg | ORAL_TABLET | Freq: Four times a day (QID) | ORAL | 0 refills | Status: AC | PRN

## 2022-01-17 MED ORDER — TIZANIDINE HCL 4 MG PO TABS: 4.0000 mg | ORAL_TABLET | Freq: Four times a day (QID) | ORAL | 0 refills | Status: DC | PRN

## 2022-01-17 MED ORDER — OXYCODONE HCL ER 10 MG PO T12A: 10.0000 mg | EXTENDED_RELEASE_TABLET | Freq: Two times a day (BID) | ORAL | 0 refills | Status: AC

## 2022-01-17 NOTE — Evaluation (Signed)
Occupational Therapy Evaluation Patient Details Name: Rodney [name] MRN: [id number] DOB: Nov 12, 1940 Today's Date: 01/17/2022   History of Present Illness 81 yo male s/p 11/14 L5-S1 laminectomy for synovial/ facet cyst PMH former smokers (1970) multiple myeloma, s/p chemoradiation, autologous stem cell transplant 11/16/11, pancreatitis,2nd degree AV block, prostate CA, spinal surgeries, cholecystectomy 2012   Clinical Impression   Patient is s/p L5-S1 laminectomy surgery resulting in functional limitations due to the deficits listed below (see OT problem list). Pt currently demonstrates balance deficits for all adls and high fall risk. Pt declining DME recommended as he does not want to be dependent on the equipment. Pt educated on the use of equipment for safety during healing process. 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 Pt motivated to continue into his new career.  Patient will benefit from skilled OT acutely to increase independence and safety with ADLS to allow discharge [facility]. Pt reports wife works at this time as well.        Recommendations for follow up therapy are one component of a multi-disciplinary discharge planning process, led by the attending physician.  Recommendations may be updated based on patient status, additional functional criteria and insurance authorization.   Follow Up Recommendations  Home health OT     Assistance Recommended at Discharge Intermittent Supervision/Assistance  Patient can return home with the following A little help with walking and/or transfers;A little help with bathing/dressing/bathroom;Assist for transportation    Functional Status Assessment  Patient has had a recent decline in their functional status and demonstrates the ability to make significant improvements in function in a reasonable and predictable amount of time.  Equipment Recommendations  BSC/3in1;Other (comment) (RW-=  pt declining RW)    Recommendations for Other Services PT consult (home therapy)     Precautions / Restrictions Precautions Precautions: Fall;Back Precaution Comments: back handout provided and reviewed for adls      Mobility Bed Mobility Overal bed mobility: Needs Assistance Bed Mobility: Rolling, Supine to Sit, Sit to Supine Rolling: Min assist   Supine to sit: Min assist, Min guard Sit to supine: Mod assist   General bed mobility comments: pt needs cues to keep back precautions and roll like a log. pt able to push up and exit from R side. Pt requires increased (A) to place bil LE onto the bed surface to return to supine.    Transfers Overall transfer level: Needs assistance Equipment used: 1 person hand held assist Transfers: Sit to/from Stand Sit to Stand: Min assist           General transfer comment: pt very wide base of support and then needing to bring feed in to power up to standing      Balance Overall balance assessment: Needs assistance Sitting-balance support: Bilateral upper extremity supported, Feet supported Sitting balance-Leahy Scale: Fair     Standing balance support: Bilateral upper extremity supported, During functional activity, Reliant on assistive device for balance Standing balance-Leahy Scale: Poor Standing balance comment: pt is high fall risk as pt using environmental supports at all times for balance                           ADL either performed or assessed with clinical judgement   ADL Overall ADL's : Needs assistance/impaired Eating/Feeding: Independent   Grooming: Oral care;Wash/dry hands;Wash/dry face;Minimal assistance;Standing Grooming Details (indicate cue type and reason): pt requires 1 hand support on  the counter with widen base of support and states "see that i had to hang on right then" indicating LOB to OT. pt educated to avoid bending. pt verbalized he had to bend a little to do his water pick. Pt educated on a  safe range of movement with demo a sink. Upper Body Bathing: Min guard;Sitting Upper Body Bathing Details (indicate cue type and reason): recommend sitting for bathing     Upper Body Dressing : Min guard;Sitting   Lower Body Dressing: Min guard;Sit to/from stand Lower Body Dressing Details (indicate cue type and reason): pt with LOB during task and turning body to reach bil UE toward footboard. pt states "see i am not going ot fall because i keep myself in position i can grab" Toilet Transfer: Minimal assistance;Ambulation Toilet Transfer Details (indicate cue type and reason): recommending BSC and educated that community based BSC could have more oval shape lids available for better male fit. Pt educated hospital issued is circle and use of urinal possibly needed Toileting- Clothing Manipulation and Hygiene: Moderate assistance;Sit to/from stand Toileting - Clothing Manipulation Details (indicate cue type and reason): educated avoid twisting.     Functional mobility during ADLs: Minimal assistance (hand held (A) and walking the walls of the room) General ADL Comments: pt explaining wanting to avoid DME to avoid dependence on equipment. pt states "if i get on it then ill never get off it" OT attempting to educate on safety balance and pain management use of RW for short term. pt states "they have explained to me that i will always walk and have a curved posture so i am trying to avoid that"     Vision Patient Visual Report: No change from baseline       Perception     Praxis      Pertinent Vitals/Pain Pain Assessment Pain Assessment: 0-10 Pain Score: 4  Pain Location: back Pain Descriptors / Indicators: Discomfort, Grimacing, Guarding, Operative site guarding Pain Intervention(s): Limited activity within patient's tolerance, Monitored during session, Premedicated before session, Repositioned     Hand Dominance Right   Extremity/Trunk Assessment Upper Extremity Assessment Upper  Extremity Assessment: Overall WFL for tasks assessed   Lower Extremity Assessment Lower Extremity Assessment: Defer to PT evaluation   Cervical / Trunk Assessment Cervical / Trunk Assessment: Back Surgery;Kyphotic   Communication Communication Communication: No difficulties   Cognition Arousal/Alertness: Awake/alert Behavior During Therapy: WFL for tasks assessed/performed Overall Cognitive Status: Impaired/Different from baseline Area of Impairment: Awareness, Safety/judgement                         Safety/Judgement: Decreased awareness of deficits, Decreased awareness of safety Awareness: Emergent   General Comments: pt is able to verbalize he had a near fall during session but continues to deny DME use     General Comments  incision dry at this time    Exercises     Shoulder Instructions      Home Living Family/patient expects to be discharged to:: Private residence Living Arrangements: Spouse/significant other Available Help at Discharge: Available PRN/intermittently;Family Type of Home: House Home Access: Stairs to enter [employer] of Steps: 1 Entrance Stairs-Rails: None Home Layout: One level     Bathroom Shower/Tub: Occupational psychologist: Standard     Home Equipment: None   Additional Comments: pt has cat named [name]. reports not wanting any DME because you become dependent on it. pt reports 3 bad falls in 6 mos.  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      Prior Functioning/Environment Prior Level of Function : Independent/Modified Independent;Driving             Mobility Comments: reports using environmental supports or walking with very wide base of support to prevent falls          OT Problem List: Decreased strength;Decreased activity tolerance;Impaired balance (sitting and/or standing);Decreased cognition;Decreased safety awareness;Decreased knowledge of use of DME or  AE;Decreased knowledge of precautions;Pain      OT Treatment/Interventions: Self-care/ADL training;Therapeutic exercise;DME and/or AE instruction;Manual therapy;Therapeutic activities;Patient/family education;Balance training;Neuromuscular education;Energy conservation    OT Goals(Current goals can be found in the care plan section) Acute Rehab OT Goals Patient Stated Goal: 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 and pass test OT Goal Formulation: With patient Time For Goal Achievement: 01/31/22 Potential to Achieve Goals: Good  OT Frequency: Min 2X/week    Co-evaluation              AM-PAC OT "6 Clicks" Daily Activity     Outcome Measure Help from another person eating meals?: None Help from another person taking care of personal grooming?: None Help from another person toileting, which includes using toliet, bedpan, or urinal?: A Little Help from another person bathing (including washing, rinsing, drying)?: A Little Help from another person to put on and taking off regular upper body clothing?: A Little Help from another person to put on and taking off regular lower body clothing?: A Little 6 Click Score: 20   End of Session Equipment Utilized During Treatment: Gait belt Nurse Communication: Mobility status;Precautions  Activity Tolerance: Patient tolerated treatment well Patient left: in bed;with call bell/phone within reach  OT Visit Diagnosis: Unsteadiness on feet (R26.81);Muscle weakness (generalized) (M62.81)                Time: 3817-7116 OT Time Calculation (min): 30 min Charges:  OT General Charges $OT Visit: 1 Visit OT Evaluation $OT Eval Moderate Complexity: 1 Mod OT Treatments $Self Care/Home Management : 8-22 mins   Brynn, OTR/L  Acute Rehabilitation Services Office: [phone] .   Jeri Modena 01/17/2022, 10:07 AM

## 2022-01-17 NOTE — Discharge Instructions (Addendum)
        Wound Care Leave incision open to air. You may shower. Do not scrub directly on incision.  Do not put any creams, lotions, or ointments on incision. Activity Walk each and every day, increasing distance each day. No lifting greater than 5 lbs.  Avoid bending, arching, and twisting. No driving for 2 weeks; may ride as a passenger locally.  Diet Resume your normal diet.  Return to Work Will be discussed at you follow up appointment. Call Your Doctor If Any of These Occur Redness, drainage, or swelling at the wound.  Temperature greater than 101 degrees. Severe pain not relieved by pain medication. Incision starts to come apart. Follow Up Appt Call today for appointment in 3 weeks ([phone]) or for problems.            Lumbar Discectomy Care After A discectomy involves removal of discmaterial (the cartilage-like structures located between the bones of the back). It is done to relieve pressure on nerve roots. It can be used as a treatment for a back problem. The time in surgery depends on the findings in surgery and what is necessary to correct the problems. HOME CARE INSTRUCTIONS  Check the cut (incision) made by the surgeon twice a day for signs of infection. Some signs of infection may include:  A foul smelling, greenish or yellowish discharge from the wound.  Increased pain.  Increased redness over the incision (operative) site.  The skin edges may separate.  Flu-like symptoms (problems).  A temperature above 101.5 F (38.6 C).  Change your bandages in about 24 to 36 hours following surgery or as directed.  You may shower tomrrow.  Avoid bathtubs, swimming pools and hot tubs for three weeks or until your incision has healed completely. Follow your doctor's instructions as to safe activities, exercises, and physical therapy.  Weight reduction may be beneficial if you are overweight.  Daily exercise is helpful to prevent the return of problems. Walking is  permitted. You may use a treadmill without an incline. Cut down on activities and exercise if you have discomfort. You may also go up and down stairs as much as you can tolerate.  DO NOT lift anything heavier than 10 to 15 lbs. Avoid bending or twisting at the waist. Always bend your knees when lifting.  Maintain strength and range of motion as instructed.  Do not drive for 10 days, or as directed by your doctors. You may be a passenger . Lying back in the passenger seat may be more comfortable for you. Always wear a seatbelt.  Limit your sitting in a regular chair to 20 to 30 minutes at a time. There are no limitations for sitting in a recliner. You should lie down or walk in between sitting periods.  Only take over-the-counter or prescription medicines for pain, discomfort, or fever as directed by your caregiver.  SEEK MEDICAL CARE IF:  There is increased bleeding (more than a small spot) from the wound.  You notice redness, swelling, or increasing pain in the wound.  Pus is coming from wound.  You develop an unexplained oral temperature above 102 F (38.9 C) develops.  You notice a foul smell coming from the wound or dressing.  You have increasing pain in your wound.  SEEK IMMEDIATE MEDICAL CARE IF:  You develop a rash.  You have difficulty breathing.  You develop any allergic problems to medicines given.  Document Released: 01/25/2004 Document Revised: 02/08/2011 Document Reviewed: 05/15/2007 ExitCare Patient Information

## 2022-01-17 NOTE — Evaluation (Signed)
Physical Therapy Evaluation Patient Details Name: Rodney [name] MRN: [id number] DOB: 02/15/1941 Today's Date: 01/17/2022  History of Present Illness  81 yo male s/p 11/14 L5-S1 laminectomy for synovial/ facet cyst PMH former smokers (1970) multiple myeloma, s/p chemoradiation, autologous stem cell transplant 11/16/11, pancreatitis,2nd degree AV block, prostate CA, spinal surgeries, cholecystectomy 2012  Clinical Impression   Patient evaluated by Physical Therapy with no further acute PT needs identified, as pt is to dc today; All education has been completed and the patient has no further questions. Emphasized need for RW to prevent falls, and highly encouraged Outpt PT after HH course for continued strengthening and balance training; See below for any follow-up Physical Therapy or equipment needs. PT is signing off. Thank you for this referral.        Recommendations for follow up therapy are one component of a multi-disciplinary discharge planning process, led by the attending physician.  Recommendations may be updated based on patient status, additional functional criteria and insurance authorization.  Follow Up Recommendations Home health PT (with Outpt PT after HH course; Outpt PT can be arranged)      Assistance Recommended at Discharge PRN  Patient can return home with the following  A little help with walking and/or transfers;Help with stairs or ramp for entrance    Equipment Recommendations Rolling walker (2 wheels);BSC/3in1  Recommendations for Other Services  OT consult    Functional Status Assessment Patient has had a recent decline in their functional status and demonstrates the ability to make significant improvements in function in a reasonable and predictable amount of time.     Precautions / Restrictions Precautions Precautions: Fall;Back Precaution Comments: back handout provided and reviewed for adls Restrictions Weight Bearing Restrictions: No       Mobility  Bed Mobility                    Transfers Overall transfer level: Needs assistance Equipment used: Rolling walker (2 wheels), None Transfers: Sit to/from Stand Sit to Stand: Min guard           General transfer comment: pt very wide base of support and then needing to bring feet in to power up to standing; heavy dependence on UEs; Stood for second time to RW; cues for hand placemetn    Ambulation/Gait Ambulation/Gait assistance: Supervision, Min guard Gait Distance (Feet): 300 Feet Assistive device: None, Rolling walker (2 wheels) Gait Pattern/deviations: Step-through pattern, Decreased step length - right, Decreased step length - left, Wide base of support Gait velocity: slowed     General Gait Details: Gait is unsteday without bil UE support form RW; tends to reach out fo r UE support, and was wide BOS; he is hesitant to use RW, but after walking without RW, and then wlaking with it, he is more open to using a RW  Stairs Stairs: Yes Stairs assistance: Min guard Stair Management: No rails, With walker, Backwards Number of Stairs: 1 General stair comments: verbal and deomo cues for sequence  Wheelchair Mobility    Modified Rankin (Stroke Patients Only)       Balance     Sitting balance-Leahy Scale: Fair       Standing balance-Leahy Scale: Poor                               Pertinent Vitals/Pain Pain Assessment Pain Assessment: 0-10 Pain Score: 4  Pain Location: back Pain Descriptors / Indicators: Discomfort,   Grimacing, Guarding, Operative site guarding Pain Intervention(s): Monitored during session    Home Living Family/patient expects to be discharged to:: Private residence Living Arrangements: Spouse/significant other Available Help at Discharge: Available PRN/intermittently;Family Type of Home: House Home Access: Stairs to enter Entrance Stairs-Rails: None Entrance Stairs-Number of Steps: 1   Home Layout: One  level Home Equipment: None Additional Comments: pt has cat named Rodney [name]. reports not wanting any DME because you become dependent on it. pt reports 3 bad falls in 6 mos. 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    Prior Function Prior Level of Function : Independent/Modified Independent;Driving             Mobility Comments: reports using environmental supports or walking with very wide base of support to prevent falls       Hand Dominance   Dominant Hand: Right    Extremity/Trunk Assessment   Upper Extremity Assessment Upper Extremity Assessment: Defer to OT evaluation    Lower Extremity Assessment Lower Extremity Assessment: Generalized weakness    Cervical / Trunk Assessment Cervical / Trunk Assessment: Back Surgery;Kyphotic  Communication   Communication: No difficulties  Cognition Arousal/Alertness: Awake/alert Behavior During Therapy: WFL for tasks assessed/performed Overall Cognitive Status: Impaired/Different from baseline Area of Impairment: Awareness, Safety/judgement                         Safety/Judgement: Decreased awareness of deficits, Decreased awareness of safety Awareness: Emergent   General Comments: More open to getting and using RW with more practice this session        General Comments General comments (skin integrity, edema, etc.): Wife Rodney [name] present for session    Exercises     Assessment/Plan    PT Assessment All further PT needs can be met in the next venue of care  PT Problem List Decreased strength;Decreased activity tolerance;Decreased balance;Decreased mobility;Decreased coordination;Decreased knowledge of use of DME;Decreased safety awareness;Pain       PT Treatment Interventions      PT Goals (Current goals can be found in the Care Plan section)  Acute Rehab PT Goals Patient Stated Goal: Get back to school PT Goal Formulation: All assessment and education complete, DC therapy     Frequency       Co-evaluation               AM-PAC PT "6 Clicks" Mobility  Outcome Measure Help needed turning from your back to your side while in a flat bed without using bedrails?: None Help needed moving from lying on your back to sitting on the side of a flat bed without using bedrails?: None Help needed moving to and from a bed to a chair (including a wheelchair)?: A Little Help needed standing up from a chair using your arms (e.g., wheelchair or bedside chair)?: A Little Help needed to walk in hospital room?: A Little Help needed climbing 3-5 steps with a railing? : A Lot 6 Click Score: 19    End of Session   Activity Tolerance: Patient tolerated treatment well Patient left: in bed;with call bell/phone within reach Nurse Communication: Mobility status (cleared for DC) PT Visit Diagnosis: Unsteadiness on feet (R26.81);History of falling (Z91.81);Other abnormalities of gait and mobility (R26.89)    Time: 0936-1028 PT Time Calculation (min) (ACUTE ONLY): 52 min   Charges:   PT Evaluation $PT Eval Low Complexity: 1 Low PT Treatments $Gait Training: 8-22 mins $Therapeutic Activity: 8-22 mins          Holly Garrigan, PT  Acute Rehabilitation Services Office [phone]   Holly H Garrigan 01/17/2022, 11:28 AM  

## 2022-01-17 NOTE — Anesthesia Postprocedure Evaluation (Signed)
Anesthesia Post Note  Patient: [name]  Procedure(s) Performed: Right Lumbar Five- Sacral One Laminectomy for synovial/facet cyst (Right)     Patient location during evaluation: PACU Anesthesia Type: General Level of consciousness: awake and alert Pain management: pain level controlled Vital Signs Assessment: post-procedure vital signs reviewed and stable Respiratory status: spontaneous breathing, nonlabored ventilation, respiratory function stable and patient connected to nasal cannula oxygen Cardiovascular status: blood pressure returned to baseline and stable Postop Assessment: no apparent nausea or vomiting Anesthetic complications: no   No notable events documented.  Last Vitals:  Vitals:   01/17/22 0407 01/17/22 0803  BP: 100/72 120/66  Pulse: (!) 58 63  Resp: 18 16  Temp: 36.7 C 36.7 C  SpO2: 97% 100%    Last Pain:  Vitals:   01/17/22 0803  TempSrc: Oral  PainSc:                  Tiajuana Amass

## 2022-01-20 DIAGNOSIS — Z4789 Encounter for other orthopedic aftercare: Secondary | ICD-10-CM | POA: Diagnosis not present

## 2022-01-20 DIAGNOSIS — Z556 Problems related to health literacy: Secondary | ICD-10-CM | POA: Diagnosis not present

## 2022-01-20 DIAGNOSIS — M8448XD Pathological fracture, other site, subsequent encounter for fracture with routine healing: Secondary | ICD-10-CM | POA: Diagnosis not present

## 2022-01-20 DIAGNOSIS — Z8781 Personal history of (healed) traumatic fracture: Secondary | ICD-10-CM | POA: Diagnosis not present

## 2022-01-20 DIAGNOSIS — E039 Hypothyroidism, unspecified: Secondary | ICD-10-CM | POA: Diagnosis not present

## 2022-01-20 DIAGNOSIS — Z981 Arthrodesis status: Secondary | ICD-10-CM | POA: Diagnosis not present

## 2022-01-20 DIAGNOSIS — Z9484 Stem cells transplant status: Secondary | ICD-10-CM | POA: Diagnosis not present

## 2022-01-20 DIAGNOSIS — K219 Gastro-esophageal reflux disease without esophagitis: Secondary | ICD-10-CM | POA: Diagnosis not present

## 2022-01-20 DIAGNOSIS — Z85828 Personal history of other malignant neoplasm of skin: Secondary | ICD-10-CM | POA: Diagnosis not present

## 2022-01-20 DIAGNOSIS — E559 Vitamin D deficiency, unspecified: Secondary | ICD-10-CM | POA: Diagnosis not present

## 2022-01-20 DIAGNOSIS — Z9181 History of falling: Secondary | ICD-10-CM | POA: Diagnosis not present

## 2022-01-20 DIAGNOSIS — N3281 Overactive bladder: Secondary | ICD-10-CM | POA: Diagnosis not present

## 2022-01-20 DIAGNOSIS — I441 Atrioventricular block, second degree: Secondary | ICD-10-CM | POA: Diagnosis not present

## 2022-01-20 DIAGNOSIS — M47819 Spondylosis without myelopathy or radiculopathy, site unspecified: Secondary | ICD-10-CM | POA: Diagnosis not present

## 2022-01-20 DIAGNOSIS — M7138 Other bursal cyst, other site: Secondary | ICD-10-CM | POA: Diagnosis not present

## 2022-01-20 DIAGNOSIS — Z87891 Personal history of nicotine dependence: Secondary | ICD-10-CM | POA: Diagnosis not present

## 2022-01-20 DIAGNOSIS — Z9049 Acquired absence of other specified parts of digestive tract: Secondary | ICD-10-CM | POA: Diagnosis not present

## 2022-01-20 DIAGNOSIS — M48061 Spinal stenosis, lumbar region without neurogenic claudication: Secondary | ICD-10-CM | POA: Diagnosis not present

## 2022-01-20 DIAGNOSIS — C9001 Multiple myeloma in remission: Secondary | ICD-10-CM | POA: Diagnosis not present

## 2022-01-20 DIAGNOSIS — J309 Allergic rhinitis, unspecified: Secondary | ICD-10-CM | POA: Diagnosis not present

## 2022-01-20 DIAGNOSIS — M160 Bilateral primary osteoarthritis of hip: Secondary | ICD-10-CM | POA: Diagnosis not present

## 2022-01-20 DIAGNOSIS — E785 Hyperlipidemia, unspecified: Secondary | ICD-10-CM | POA: Diagnosis not present

## 2022-01-20 DIAGNOSIS — Z7901 Long term (current) use of anticoagulants: Secondary | ICD-10-CM | POA: Diagnosis not present

## 2022-01-21 DIAGNOSIS — Z9181 History of falling: Secondary | ICD-10-CM | POA: Diagnosis not present

## 2022-01-21 DIAGNOSIS — Z87891 Personal history of nicotine dependence: Secondary | ICD-10-CM | POA: Diagnosis not present

## 2022-01-21 DIAGNOSIS — Z9049 Acquired absence of other specified parts of digestive tract: Secondary | ICD-10-CM | POA: Diagnosis not present

## 2022-01-21 DIAGNOSIS — I441 Atrioventricular block, second degree: Secondary | ICD-10-CM | POA: Diagnosis not present

## 2022-01-21 DIAGNOSIS — E039 Hypothyroidism, unspecified: Secondary | ICD-10-CM | POA: Diagnosis not present

## 2022-01-21 DIAGNOSIS — E559 Vitamin D deficiency, unspecified: Secondary | ICD-10-CM | POA: Diagnosis not present

## 2022-01-21 DIAGNOSIS — M47819 Spondylosis without myelopathy or radiculopathy, site unspecified: Secondary | ICD-10-CM | POA: Diagnosis not present

## 2022-01-21 DIAGNOSIS — E785 Hyperlipidemia, unspecified: Secondary | ICD-10-CM | POA: Diagnosis not present

## 2022-01-21 DIAGNOSIS — Z7901 Long term (current) use of anticoagulants: Secondary | ICD-10-CM | POA: Diagnosis not present

## 2022-01-21 DIAGNOSIS — Z556 Problems related to health literacy: Secondary | ICD-10-CM | POA: Diagnosis not present

## 2022-01-21 DIAGNOSIS — Z4789 Encounter for other orthopedic aftercare: Secondary | ICD-10-CM | POA: Diagnosis not present

## 2022-01-21 DIAGNOSIS — Z85828 Personal history of other malignant neoplasm of skin: Secondary | ICD-10-CM | POA: Diagnosis not present

## 2022-01-21 DIAGNOSIS — M160 Bilateral primary osteoarthritis of hip: Secondary | ICD-10-CM | POA: Diagnosis not present

## 2022-01-21 DIAGNOSIS — M7138 Other bursal cyst, other site: Secondary | ICD-10-CM | POA: Diagnosis not present

## 2022-01-21 DIAGNOSIS — M48061 Spinal stenosis, lumbar region without neurogenic claudication: Secondary | ICD-10-CM | POA: Diagnosis not present

## 2022-01-21 DIAGNOSIS — J309 Allergic rhinitis, unspecified: Secondary | ICD-10-CM | POA: Diagnosis not present

## 2022-01-21 DIAGNOSIS — M8448XD Pathological fracture, other site, subsequent encounter for fracture with routine healing: Secondary | ICD-10-CM | POA: Diagnosis not present

## 2022-01-21 DIAGNOSIS — Z8781 Personal history of (healed) traumatic fracture: Secondary | ICD-10-CM | POA: Diagnosis not present

## 2022-01-21 DIAGNOSIS — C9001 Multiple myeloma in remission: Secondary | ICD-10-CM | POA: Diagnosis not present

## 2022-01-21 DIAGNOSIS — Z981 Arthrodesis status: Secondary | ICD-10-CM | POA: Diagnosis not present

## 2022-01-21 DIAGNOSIS — N3281 Overactive bladder: Secondary | ICD-10-CM | POA: Diagnosis not present

## 2022-01-21 DIAGNOSIS — K219 Gastro-esophageal reflux disease without esophagitis: Secondary | ICD-10-CM | POA: Diagnosis not present

## 2022-01-21 DIAGNOSIS — Z9484 Stem cells transplant status: Secondary | ICD-10-CM | POA: Diagnosis not present

## 2022-01-24 DIAGNOSIS — M8448XD Pathological fracture, other site, subsequent encounter for fracture with routine healing: Secondary | ICD-10-CM | POA: Diagnosis not present

## 2022-01-24 DIAGNOSIS — Z8781 Personal history of (healed) traumatic fracture: Secondary | ICD-10-CM | POA: Diagnosis not present

## 2022-01-24 DIAGNOSIS — M48061 Spinal stenosis, lumbar region without neurogenic claudication: Secondary | ICD-10-CM | POA: Diagnosis not present

## 2022-01-24 DIAGNOSIS — K219 Gastro-esophageal reflux disease without esophagitis: Secondary | ICD-10-CM | POA: Diagnosis not present

## 2022-01-24 DIAGNOSIS — M7138 Other bursal cyst, other site: Secondary | ICD-10-CM | POA: Diagnosis not present

## 2022-01-24 DIAGNOSIS — Z556 Problems related to health literacy: Secondary | ICD-10-CM | POA: Diagnosis not present

## 2022-01-24 DIAGNOSIS — E559 Vitamin D deficiency, unspecified: Secondary | ICD-10-CM | POA: Diagnosis not present

## 2022-01-24 DIAGNOSIS — Z981 Arthrodesis status: Secondary | ICD-10-CM | POA: Diagnosis not present

## 2022-01-24 DIAGNOSIS — Z9049 Acquired absence of other specified parts of digestive tract: Secondary | ICD-10-CM | POA: Diagnosis not present

## 2022-01-24 DIAGNOSIS — I441 Atrioventricular block, second degree: Secondary | ICD-10-CM | POA: Diagnosis not present

## 2022-01-24 DIAGNOSIS — M47819 Spondylosis without myelopathy or radiculopathy, site unspecified: Secondary | ICD-10-CM | POA: Diagnosis not present

## 2022-01-24 DIAGNOSIS — Z85828 Personal history of other malignant neoplasm of skin: Secondary | ICD-10-CM | POA: Diagnosis not present

## 2022-01-24 DIAGNOSIS — N3281 Overactive bladder: Secondary | ICD-10-CM | POA: Diagnosis not present

## 2022-01-24 DIAGNOSIS — E785 Hyperlipidemia, unspecified: Secondary | ICD-10-CM | POA: Diagnosis not present

## 2022-01-24 DIAGNOSIS — Z4789 Encounter for other orthopedic aftercare: Secondary | ICD-10-CM | POA: Diagnosis not present

## 2022-01-24 DIAGNOSIS — J309 Allergic rhinitis, unspecified: Secondary | ICD-10-CM | POA: Diagnosis not present

## 2022-01-24 DIAGNOSIS — E039 Hypothyroidism, unspecified: Secondary | ICD-10-CM | POA: Diagnosis not present

## 2022-01-24 DIAGNOSIS — C9001 Multiple myeloma in remission: Secondary | ICD-10-CM | POA: Diagnosis not present

## 2022-01-24 DIAGNOSIS — Z9484 Stem cells transplant status: Secondary | ICD-10-CM | POA: Diagnosis not present

## 2022-01-24 DIAGNOSIS — Z9181 History of falling: Secondary | ICD-10-CM | POA: Diagnosis not present

## 2022-01-24 DIAGNOSIS — M160 Bilateral primary osteoarthritis of hip: Secondary | ICD-10-CM | POA: Diagnosis not present

## 2022-01-24 DIAGNOSIS — Z7901 Long term (current) use of anticoagulants: Secondary | ICD-10-CM | POA: Diagnosis not present

## 2022-01-24 DIAGNOSIS — Z87891 Personal history of nicotine dependence: Secondary | ICD-10-CM | POA: Diagnosis not present

## 2022-01-29 ENCOUNTER — Other Ambulatory Visit: Payer: Self-pay | Admitting: Urology

## 2022-01-30 DIAGNOSIS — E559 Vitamin D deficiency, unspecified: Secondary | ICD-10-CM | POA: Diagnosis not present

## 2022-01-30 DIAGNOSIS — E785 Hyperlipidemia, unspecified: Secondary | ICD-10-CM | POA: Diagnosis not present

## 2022-01-30 DIAGNOSIS — Z8781 Personal history of (healed) traumatic fracture: Secondary | ICD-10-CM | POA: Diagnosis not present

## 2022-01-30 DIAGNOSIS — M160 Bilateral primary osteoarthritis of hip: Secondary | ICD-10-CM | POA: Diagnosis not present

## 2022-01-30 DIAGNOSIS — J309 Allergic rhinitis, unspecified: Secondary | ICD-10-CM | POA: Diagnosis not present

## 2022-01-30 DIAGNOSIS — M7138 Other bursal cyst, other site: Secondary | ICD-10-CM | POA: Diagnosis not present

## 2022-01-30 DIAGNOSIS — Z87891 Personal history of nicotine dependence: Secondary | ICD-10-CM | POA: Diagnosis not present

## 2022-01-30 DIAGNOSIS — I441 Atrioventricular block, second degree: Secondary | ICD-10-CM | POA: Diagnosis not present

## 2022-01-30 DIAGNOSIS — Z556 Problems related to health literacy: Secondary | ICD-10-CM | POA: Diagnosis not present

## 2022-01-30 DIAGNOSIS — M47819 Spondylosis without myelopathy or radiculopathy, site unspecified: Secondary | ICD-10-CM | POA: Diagnosis not present

## 2022-01-30 DIAGNOSIS — E039 Hypothyroidism, unspecified: Secondary | ICD-10-CM | POA: Diagnosis not present

## 2022-01-30 DIAGNOSIS — N3281 Overactive bladder: Secondary | ICD-10-CM | POA: Diagnosis not present

## 2022-01-30 DIAGNOSIS — Z4789 Encounter for other orthopedic aftercare: Secondary | ICD-10-CM | POA: Diagnosis not present

## 2022-01-30 DIAGNOSIS — Z981 Arthrodesis status: Secondary | ICD-10-CM | POA: Diagnosis not present

## 2022-01-30 DIAGNOSIS — M48061 Spinal stenosis, lumbar region without neurogenic claudication: Secondary | ICD-10-CM | POA: Diagnosis not present

## 2022-01-30 DIAGNOSIS — Z9181 History of falling: Secondary | ICD-10-CM | POA: Diagnosis not present

## 2022-01-30 DIAGNOSIS — M8448XD Pathological fracture, other site, subsequent encounter for fracture with routine healing: Secondary | ICD-10-CM | POA: Diagnosis not present

## 2022-01-30 DIAGNOSIS — Z7901 Long term (current) use of anticoagulants: Secondary | ICD-10-CM | POA: Diagnosis not present

## 2022-01-30 DIAGNOSIS — Z9049 Acquired absence of other specified parts of digestive tract: Secondary | ICD-10-CM | POA: Diagnosis not present

## 2022-01-30 DIAGNOSIS — Z9484 Stem cells transplant status: Secondary | ICD-10-CM | POA: Diagnosis not present

## 2022-01-30 DIAGNOSIS — Z85828 Personal history of other malignant neoplasm of skin: Secondary | ICD-10-CM | POA: Diagnosis not present

## 2022-01-30 DIAGNOSIS — K219 Gastro-esophageal reflux disease without esophagitis: Secondary | ICD-10-CM | POA: Diagnosis not present

## 2022-01-30 DIAGNOSIS — C9001 Multiple myeloma in remission: Secondary | ICD-10-CM | POA: Diagnosis not present

## 2022-02-01 DIAGNOSIS — M7138 Other bursal cyst, other site: Secondary | ICD-10-CM | POA: Diagnosis not present

## 2022-02-01 DIAGNOSIS — Z7901 Long term (current) use of anticoagulants: Secondary | ICD-10-CM | POA: Diagnosis not present

## 2022-02-02 DIAGNOSIS — Z85828 Personal history of other malignant neoplasm of skin: Secondary | ICD-10-CM | POA: Diagnosis not present

## 2022-02-02 DIAGNOSIS — M7138 Other bursal cyst, other site: Secondary | ICD-10-CM | POA: Diagnosis not present

## 2022-02-02 DIAGNOSIS — J309 Allergic rhinitis, unspecified: Secondary | ICD-10-CM | POA: Diagnosis not present

## 2022-02-02 DIAGNOSIS — C9001 Multiple myeloma in remission: Secondary | ICD-10-CM | POA: Diagnosis not present

## 2022-02-02 DIAGNOSIS — Z9484 Stem cells transplant status: Secondary | ICD-10-CM | POA: Diagnosis not present

## 2022-02-02 DIAGNOSIS — I441 Atrioventricular block, second degree: Secondary | ICD-10-CM | POA: Diagnosis not present

## 2022-02-02 DIAGNOSIS — M8448XD Pathological fracture, other site, subsequent encounter for fracture with routine healing: Secondary | ICD-10-CM | POA: Diagnosis not present

## 2022-02-02 DIAGNOSIS — Z4789 Encounter for other orthopedic aftercare: Secondary | ICD-10-CM | POA: Diagnosis not present

## 2022-02-02 DIAGNOSIS — Z8781 Personal history of (healed) traumatic fracture: Secondary | ICD-10-CM | POA: Diagnosis not present

## 2022-02-02 DIAGNOSIS — Z556 Problems related to health literacy: Secondary | ICD-10-CM | POA: Diagnosis not present

## 2022-02-02 DIAGNOSIS — N3281 Overactive bladder: Secondary | ICD-10-CM | POA: Diagnosis not present

## 2022-02-02 DIAGNOSIS — E039 Hypothyroidism, unspecified: Secondary | ICD-10-CM | POA: Diagnosis not present

## 2022-02-02 DIAGNOSIS — M48061 Spinal stenosis, lumbar region without neurogenic claudication: Secondary | ICD-10-CM | POA: Diagnosis not present

## 2022-02-02 DIAGNOSIS — Z981 Arthrodesis status: Secondary | ICD-10-CM | POA: Diagnosis not present

## 2022-02-02 DIAGNOSIS — E785 Hyperlipidemia, unspecified: Secondary | ICD-10-CM | POA: Diagnosis not present

## 2022-02-02 DIAGNOSIS — Z7901 Long term (current) use of anticoagulants: Secondary | ICD-10-CM | POA: Diagnosis not present

## 2022-02-02 DIAGNOSIS — M160 Bilateral primary osteoarthritis of hip: Secondary | ICD-10-CM | POA: Diagnosis not present

## 2022-02-02 DIAGNOSIS — Z9181 History of falling: Secondary | ICD-10-CM | POA: Diagnosis not present

## 2022-02-02 DIAGNOSIS — M47819 Spondylosis without myelopathy or radiculopathy, site unspecified: Secondary | ICD-10-CM | POA: Diagnosis not present

## 2022-02-02 DIAGNOSIS — K219 Gastro-esophageal reflux disease without esophagitis: Secondary | ICD-10-CM | POA: Diagnosis not present

## 2022-02-02 DIAGNOSIS — Z87891 Personal history of nicotine dependence: Secondary | ICD-10-CM | POA: Diagnosis not present

## 2022-02-02 DIAGNOSIS — E559 Vitamin D deficiency, unspecified: Secondary | ICD-10-CM | POA: Diagnosis not present

## 2022-02-02 DIAGNOSIS — Z9049 Acquired absence of other specified parts of digestive tract: Secondary | ICD-10-CM | POA: Diagnosis not present

## 2022-02-05 NOTE — Discharge Summary (Signed)
Physician Discharge Summary  Patient ID: [name] MRN: [id number] DOB/AGE: [birth year] 79 y.o.  Admit date: 01/16/2022 Discharge date: 02/05/2022  Admission Diagnoses: right synovial cyst lumbar 5/1  Discharge Diagnoses:  Principal Problem:   Synovial cyst of lumbar facet joint   Discharged Condition: good  Hospital Course: [name] was taken to the operating room and had an uncomplicated lumbar laminectomy and facetectomy to remove the synovial cyst.   Treatments: surgery: Right Lumbar Five- Sacral One Laminectomy for synovial/facet cyst   Discharge Exam: Blood pressure 120/66, pulse 63, temperature 98.1 F (36.7 C), temperature source Oral, resp. rate 16, height '5\' 11"'$  (1.803 m), weight 89.8 kg, SpO2 100 %. General appearance: alert, cooperative, appears stated age, and mild distress  Disposition: Discharge disposition: 01-Home or Self Care      Synovial/Facet cyst, lumbar  Allergies as of 01/17/2022       Reactions   Blood-group Specific Substance    Must be Leukocyte Reduced and Irradiated due to stem cell transplant.    Prednisone Other (See Comments)   Agitation  Anger        Medication List     TAKE these medications    acetaminophen 500 MG tablet Commonly known as: TYLENOL Take 500 mg by mouth every 6 (six) hours as needed for headache (pain).   Anusol-HC 2.5 % rectal cream Generic drug: hydrocortisone Place 1 Application rectally 2 (two) times daily as needed for hemorrhoids.   CALTRATE 600+D PO Take 1 tablet by mouth 2 (two) times daily.   cetirizine 10 MG chewable tablet Commonly known as: ZYRTEC Chew 10 mg by mouth daily.   citalopram 10 MG tablet Commonly known as: CELEXA TAKE 1 TABLET BY MOUTH EVERY DAY What changed: when to take this   ESTER-C PO Take 1,000 mg by mouth 2 (two) times daily.   Eye Multivitamin/Lutein Tabs Take 1 tablet by mouth in the morning.   gabapentin 300 MG capsule Commonly known as:  NEURONTIN TAKE 3 CAPSULES BY MOUTH AT BEDTIME**PATIENT NEEDS APT FOR FURTHER REFILLS OF THIS MEDICATION** What changed:  how much to take how to take this when to take this additional instructions   levothyroxine 112 MCG tablet Commonly known as: SYNTHROID Take 112 mcg by mouth in the morning.   levothyroxine 100 MCG tablet Commonly known as: SYNTHROID TAKE 1 TABLET BY MOUTH DAILY   Myrbetriq 25 MG Tb24 tablet Generic drug: mirabegron ER TAKE 1 TABLET BY MOUTH EVERY DAY   nitroGLYCERIN 0.2 mg/hr patch Commonly known as: NITRODUR - Dosed in mg/24 hr PLACE 1/4 TO 1/2 OF PATCH OVER AFFECTED REGION. REMOVE AND REPLACE ONCE DAILY. SLIGHTLY ALTER SKIN PLACEMENT DAILY What changed:  how much to take how to take this when to take this reasons to take this additional instructions   omeprazole 20 MG capsule Commonly known as: PRILOSEC TAKE 1 CAPSULE BY MOUTH TWICE DAILY   OVER THE COUNTER MEDICATION Take 1 tablet by mouth in the morning. Biotin Plus 5000 mcg - 10 mg   POTASSIUM CHLORIDE PO Take 200 mEq by mouth at bedtime.   potassium chloride SA 20 MEQ tablet Commonly known as: KLOR-CON M TAKE 1 TABLET BY MOUTH EVERY DAY   tiZANidine 4 MG tablet Commonly known as: ZANAFLEX Take 1 tablet (4 mg total) by mouth every 6 (six) hours as needed for muscle spasms.   tolterodine 4 MG 24 hr capsule Commonly known as: DETROL LA TAKE 1 CAPSULE BY MOUTH EVERY DAY What changed:  how  much to take when to take this   VITAMIN B-12 PO Take 1 tablet by mouth in the morning.   Vitamin D 1000 units capsule Take 4,000 Units by mouth in the morning.   warfarin 5 MG tablet Commonly known as: COUMADIN TAKE ONE TABLET BY MOUTH DAILY EXCEPT MONDAY AND FRIDAY TAKE TAKE 1/2 TABLET BY MOUTH DAILY ON MONDAYS AND FRIDAYS What changed:  how much to take how to take this when to take this additional instructions       ASK your doctor about these medications    oxyCODONE 5 MG immediate  release tablet Commonly known as: Oxy IR/ROXICODONE Take 1 tablet (5 mg total) by mouth every 6 (six) hours as needed for up to 8 days for moderate pain ((score 4 to 6)). Ask about: Should I take this medication?   oxyCODONE 10 mg 12 hr tablet Commonly known as: OxyCONTIN Take 1 tablet (10 mg total) by mouth every 12 (twelve) hours for 7 days. Ask about: Should I take this medication?        Follow-up Information     Ashok Pall, MD Follow up.   Specialty: Neurosurgery Why: keep your scheduled appointment Contact information: [address] [place] [address] [phone]         [facility] Follow up.   Why: The home health agency will contact you for the first home visit Contact information: [phone]                Signed: Ashok Pall 02/05/2022, 7:05 PM

## 2022-02-07 DIAGNOSIS — M47819 Spondylosis without myelopathy or radiculopathy, site unspecified: Secondary | ICD-10-CM | POA: Diagnosis not present

## 2022-02-07 DIAGNOSIS — I441 Atrioventricular block, second degree: Secondary | ICD-10-CM | POA: Diagnosis not present

## 2022-02-07 DIAGNOSIS — Z4789 Encounter for other orthopedic aftercare: Secondary | ICD-10-CM | POA: Diagnosis not present

## 2022-02-07 DIAGNOSIS — Z85828 Personal history of other malignant neoplasm of skin: Secondary | ICD-10-CM | POA: Diagnosis not present

## 2022-02-07 DIAGNOSIS — M48061 Spinal stenosis, lumbar region without neurogenic claudication: Secondary | ICD-10-CM | POA: Diagnosis not present

## 2022-02-07 DIAGNOSIS — Z9181 History of falling: Secondary | ICD-10-CM | POA: Diagnosis not present

## 2022-02-07 DIAGNOSIS — N3281 Overactive bladder: Secondary | ICD-10-CM | POA: Diagnosis not present

## 2022-02-07 DIAGNOSIS — K219 Gastro-esophageal reflux disease without esophagitis: Secondary | ICD-10-CM | POA: Diagnosis not present

## 2022-02-07 DIAGNOSIS — Z87891 Personal history of nicotine dependence: Secondary | ICD-10-CM | POA: Diagnosis not present

## 2022-02-07 DIAGNOSIS — E559 Vitamin D deficiency, unspecified: Secondary | ICD-10-CM | POA: Diagnosis not present

## 2022-02-07 DIAGNOSIS — E039 Hypothyroidism, unspecified: Secondary | ICD-10-CM | POA: Diagnosis not present

## 2022-02-07 DIAGNOSIS — J309 Allergic rhinitis, unspecified: Secondary | ICD-10-CM | POA: Diagnosis not present

## 2022-02-07 DIAGNOSIS — Z9484 Stem cells transplant status: Secondary | ICD-10-CM | POA: Diagnosis not present

## 2022-02-07 DIAGNOSIS — M8448XD Pathological fracture, other site, subsequent encounter for fracture with routine healing: Secondary | ICD-10-CM | POA: Diagnosis not present

## 2022-02-07 DIAGNOSIS — M160 Bilateral primary osteoarthritis of hip: Secondary | ICD-10-CM | POA: Diagnosis not present

## 2022-02-07 DIAGNOSIS — Z8781 Personal history of (healed) traumatic fracture: Secondary | ICD-10-CM | POA: Diagnosis not present

## 2022-02-07 DIAGNOSIS — Z9049 Acquired absence of other specified parts of digestive tract: Secondary | ICD-10-CM | POA: Diagnosis not present

## 2022-02-07 DIAGNOSIS — Z7901 Long term (current) use of anticoagulants: Secondary | ICD-10-CM | POA: Diagnosis not present

## 2022-02-07 DIAGNOSIS — Z556 Problems related to health literacy: Secondary | ICD-10-CM | POA: Diagnosis not present

## 2022-02-07 DIAGNOSIS — Z981 Arthrodesis status: Secondary | ICD-10-CM | POA: Diagnosis not present

## 2022-02-07 DIAGNOSIS — C9001 Multiple myeloma in remission: Secondary | ICD-10-CM | POA: Diagnosis not present

## 2022-02-07 DIAGNOSIS — E785 Hyperlipidemia, unspecified: Secondary | ICD-10-CM | POA: Diagnosis not present

## 2022-02-07 DIAGNOSIS — M7138 Other bursal cyst, other site: Secondary | ICD-10-CM | POA: Diagnosis not present

## 2022-02-14 DIAGNOSIS — Z87891 Personal history of nicotine dependence: Secondary | ICD-10-CM | POA: Diagnosis not present

## 2022-02-14 DIAGNOSIS — M47819 Spondylosis without myelopathy or radiculopathy, site unspecified: Secondary | ICD-10-CM | POA: Diagnosis not present

## 2022-02-14 DIAGNOSIS — Z85828 Personal history of other malignant neoplasm of skin: Secondary | ICD-10-CM | POA: Diagnosis not present

## 2022-02-14 DIAGNOSIS — Z9181 History of falling: Secondary | ICD-10-CM | POA: Diagnosis not present

## 2022-02-14 DIAGNOSIS — M7138 Other bursal cyst, other site: Secondary | ICD-10-CM | POA: Diagnosis not present

## 2022-02-14 DIAGNOSIS — I441 Atrioventricular block, second degree: Secondary | ICD-10-CM | POA: Diagnosis not present

## 2022-02-14 DIAGNOSIS — Z556 Problems related to health literacy: Secondary | ICD-10-CM | POA: Diagnosis not present

## 2022-02-14 DIAGNOSIS — E559 Vitamin D deficiency, unspecified: Secondary | ICD-10-CM | POA: Diagnosis not present

## 2022-02-14 DIAGNOSIS — Z7901 Long term (current) use of anticoagulants: Secondary | ICD-10-CM | POA: Diagnosis not present

## 2022-02-14 DIAGNOSIS — C9001 Multiple myeloma in remission: Secondary | ICD-10-CM | POA: Diagnosis not present

## 2022-02-14 DIAGNOSIS — E785 Hyperlipidemia, unspecified: Secondary | ICD-10-CM | POA: Diagnosis not present

## 2022-02-14 DIAGNOSIS — N3281 Overactive bladder: Secondary | ICD-10-CM | POA: Diagnosis not present

## 2022-02-14 DIAGNOSIS — Z4789 Encounter for other orthopedic aftercare: Secondary | ICD-10-CM | POA: Diagnosis not present

## 2022-02-14 DIAGNOSIS — E039 Hypothyroidism, unspecified: Secondary | ICD-10-CM | POA: Diagnosis not present

## 2022-02-14 DIAGNOSIS — Z981 Arthrodesis status: Secondary | ICD-10-CM | POA: Diagnosis not present

## 2022-02-14 DIAGNOSIS — Z8781 Personal history of (healed) traumatic fracture: Secondary | ICD-10-CM | POA: Diagnosis not present

## 2022-02-14 DIAGNOSIS — Z9049 Acquired absence of other specified parts of digestive tract: Secondary | ICD-10-CM | POA: Diagnosis not present

## 2022-02-14 DIAGNOSIS — M160 Bilateral primary osteoarthritis of hip: Secondary | ICD-10-CM | POA: Diagnosis not present

## 2022-02-14 DIAGNOSIS — M8448XD Pathological fracture, other site, subsequent encounter for fracture with routine healing: Secondary | ICD-10-CM | POA: Diagnosis not present

## 2022-02-14 DIAGNOSIS — Z9484 Stem cells transplant status: Secondary | ICD-10-CM | POA: Diagnosis not present

## 2022-02-14 DIAGNOSIS — J309 Allergic rhinitis, unspecified: Secondary | ICD-10-CM | POA: Diagnosis not present

## 2022-02-14 DIAGNOSIS — M48061 Spinal stenosis, lumbar region without neurogenic claudication: Secondary | ICD-10-CM | POA: Diagnosis not present

## 2022-02-14 DIAGNOSIS — K219 Gastro-esophageal reflux disease without esophagitis: Secondary | ICD-10-CM | POA: Diagnosis not present

## 2022-03-01 DIAGNOSIS — L821 Other seborrheic keratosis: Secondary | ICD-10-CM | POA: Diagnosis not present

## 2022-03-01 DIAGNOSIS — D1801 Hemangioma of skin and subcutaneous tissue: Secondary | ICD-10-CM | POA: Diagnosis not present

## 2022-03-01 DIAGNOSIS — Z85828 Personal history of other malignant neoplasm of skin: Secondary | ICD-10-CM | POA: Diagnosis not present

## 2022-03-01 DIAGNOSIS — L570 Actinic keratosis: Secondary | ICD-10-CM | POA: Diagnosis not present

## 2022-03-01 DIAGNOSIS — C44519 Basal cell carcinoma of skin of other part of trunk: Secondary | ICD-10-CM | POA: Diagnosis not present

## 2022-03-01 DIAGNOSIS — L814 Other melanin hyperpigmentation: Secondary | ICD-10-CM | POA: Diagnosis not present

## 2022-03-01 DIAGNOSIS — D485 Neoplasm of uncertain behavior of skin: Secondary | ICD-10-CM | POA: Diagnosis not present

## 2022-03-01 DIAGNOSIS — Z08 Encounter for follow-up examination after completed treatment for malignant neoplasm: Secondary | ICD-10-CM | POA: Diagnosis not present

## 2022-03-13 DIAGNOSIS — Z7901 Long term (current) use of anticoagulants: Secondary | ICD-10-CM | POA: Diagnosis not present

## 2022-03-28 ENCOUNTER — Other Ambulatory Visit (HOSPITAL_BASED_OUTPATIENT_CLINIC_OR_DEPARTMENT_OTHER): Payer: Self-pay

## 2022-03-28 DIAGNOSIS — F339 Major depressive disorder, recurrent, unspecified: Secondary | ICD-10-CM

## 2022-03-28 MED ORDER — CITALOPRAM HYDROBROMIDE 10 MG PO TABS: 10.0000 mg | ORAL_TABLET | Freq: Every day | ORAL | 1 refills | Status: DC

## 2022-04-02 DIAGNOSIS — Z79899 Other long term (current) drug therapy: Secondary | ICD-10-CM | POA: Diagnosis not present

## 2022-04-02 DIAGNOSIS — Z7901 Long term (current) use of anticoagulants: Secondary | ICD-10-CM | POA: Diagnosis not present

## 2022-04-04 DIAGNOSIS — Z7901 Long term (current) use of anticoagulants: Secondary | ICD-10-CM | POA: Diagnosis not present

## 2022-04-05 DIAGNOSIS — C9001 Multiple myeloma in remission: Secondary | ICD-10-CM | POA: Diagnosis not present

## 2022-04-05 DIAGNOSIS — Z7901 Long term (current) use of anticoagulants: Secondary | ICD-10-CM | POA: Diagnosis not present

## 2022-04-05 DIAGNOSIS — R6889 Other general symptoms and signs: Secondary | ICD-10-CM | POA: Diagnosis not present

## 2022-04-11 DIAGNOSIS — Z789 Other specified health status: Secondary | ICD-10-CM | POA: Diagnosis not present

## 2022-04-11 DIAGNOSIS — L298 Other pruritus: Secondary | ICD-10-CM | POA: Diagnosis not present

## 2022-04-11 DIAGNOSIS — R208 Other disturbances of skin sensation: Secondary | ICD-10-CM | POA: Diagnosis not present

## 2022-04-11 DIAGNOSIS — L820 Inflamed seborrheic keratosis: Secondary | ICD-10-CM | POA: Diagnosis not present

## 2022-04-11 DIAGNOSIS — L905 Scar conditions and fibrosis of skin: Secondary | ICD-10-CM | POA: Diagnosis not present

## 2022-04-11 DIAGNOSIS — L538 Other specified erythematous conditions: Secondary | ICD-10-CM | POA: Diagnosis not present

## 2022-04-11 DIAGNOSIS — C44519 Basal cell carcinoma of skin of other part of trunk: Secondary | ICD-10-CM | POA: Diagnosis not present

## 2022-04-30 ENCOUNTER — Other Ambulatory Visit: Payer: Self-pay | Admitting: Family Medicine

## 2022-05-16 DIAGNOSIS — Z7901 Long term (current) use of anticoagulants: Secondary | ICD-10-CM | POA: Diagnosis not present

## 2022-05-22 DIAGNOSIS — Z0130 Encounter for examination of blood pressure without abnormal findings: Secondary | ICD-10-CM | POA: Diagnosis not present

## 2022-05-22 DIAGNOSIS — I441 Atrioventricular block, second degree: Secondary | ICD-10-CM | POA: Diagnosis not present

## 2022-05-22 DIAGNOSIS — Z5320 Procedure and treatment not carried out because of patient's decision for unspecified reasons: Secondary | ICD-10-CM | POA: Diagnosis not present

## 2022-05-22 DIAGNOSIS — Z7901 Long term (current) use of anticoagulants: Secondary | ICD-10-CM | POA: Diagnosis not present

## 2022-05-22 DIAGNOSIS — Z8579 Personal history of other malignant neoplasms of lymphoid, hematopoietic and related tissues: Secondary | ICD-10-CM | POA: Diagnosis not present

## 2022-05-22 DIAGNOSIS — R030 Elevated blood-pressure reading, without diagnosis of hypertension: Secondary | ICD-10-CM | POA: Diagnosis not present

## 2022-05-24 ENCOUNTER — Other Ambulatory Visit (HOSPITAL_BASED_OUTPATIENT_CLINIC_OR_DEPARTMENT_OTHER): Payer: Self-pay

## 2022-05-24 MED ORDER — OMEPRAZOLE 20 MG PO CPDR: 20.0000 mg | DELAYED_RELEASE_CAPSULE | Freq: Two times a day (BID) | ORAL | 1 refills | Status: DC

## 2022-05-25 ENCOUNTER — Other Ambulatory Visit (HOSPITAL_BASED_OUTPATIENT_CLINIC_OR_DEPARTMENT_OTHER): Payer: Self-pay

## 2022-05-25 DIAGNOSIS — F339 Major depressive disorder, recurrent, unspecified: Secondary | ICD-10-CM

## 2022-05-25 MED ORDER — CITALOPRAM HYDROBROMIDE 10 MG PO TABS: 10.0000 mg | ORAL_TABLET | Freq: Every day | ORAL | 1 refills | Status: DC

## 2022-05-29 DIAGNOSIS — I441 Atrioventricular block, second degree: Secondary | ICD-10-CM | POA: Diagnosis not present

## 2022-05-29 DIAGNOSIS — Z7901 Long term (current) use of anticoagulants: Secondary | ICD-10-CM | POA: Diagnosis not present

## 2022-06-05 ENCOUNTER — Ambulatory Visit (INDEPENDENT_AMBULATORY_CARE_PROVIDER_SITE_OTHER): Payer: Medicare Other | Admitting: Podiatry

## 2022-06-05 ENCOUNTER — Ambulatory Visit (INDEPENDENT_AMBULATORY_CARE_PROVIDER_SITE_OTHER): Payer: Medicare Other

## 2022-06-05 ENCOUNTER — Other Ambulatory Visit: Payer: Self-pay | Admitting: Podiatry

## 2022-06-05 ENCOUNTER — Encounter: Payer: Self-pay | Admitting: Podiatry

## 2022-06-05 DIAGNOSIS — R52 Pain, unspecified: Secondary | ICD-10-CM

## 2022-06-05 DIAGNOSIS — I441 Atrioventricular block, second degree: Secondary | ICD-10-CM | POA: Diagnosis not present

## 2022-06-05 DIAGNOSIS — M7751 Other enthesopathy of right foot: Secondary | ICD-10-CM

## 2022-06-05 DIAGNOSIS — Z7901 Long term (current) use of anticoagulants: Secondary | ICD-10-CM

## 2022-06-05 DIAGNOSIS — M722 Plantar fascial fibromatosis: Secondary | ICD-10-CM | POA: Diagnosis not present

## 2022-06-05 DIAGNOSIS — B351 Tinea unguium: Secondary | ICD-10-CM

## 2022-06-05 DIAGNOSIS — M79674 Pain in right toe(s): Secondary | ICD-10-CM

## 2022-06-09 NOTE — Progress Notes (Signed)
Subjective: . Chief Complaint  Patient presents with   Ankle Pain    Lateral ankle right - aching, swelling x few months, no injury, does have sciatic issues right side, in truck driving school currently   Debridement    Trim toenails   82 year old male presents the office today with the above concerns.  Has been getting discomfort submetatarsal area of the right provide with the lateral aspect of the ankle.  No recent injuries that he reports.   Nails are thickened discolored he has difficulty trimming them and they are causing discomfort.  No open lesions.  Objective: AAO x3, NAD DP/PT pulses palpable bilaterally, CRT less than 3 seconds Mild discomfort the lateral aspect of the right ankle.  This is along the area of sinus tarsi.  Flexor, extensor tendons appear to be intact.  Discomfort submetatarsal area without any underlying ulceration drainage or signs of infection.  There is also tenderness palpation along the plantar aspect of the calcaneus on insertion of the plantar fascia. Nails are hypertrophic, dystrophic, brittle, discolored, elongated 10. No surrounding redness or drainage. Tenderness nails 1-5 bilaterally. No open lesions or pre-ulcerative lesions are identified today. No pain with calf compression, swelling, warmth, erythema  Assessment: Left insertional peroneal tendinitis, right plantar fasciitis; symptomatic onychomycosis  Plan: -All treatment options discussed with the patient including all alternatives, risks, complications.  -X-rays were obtained reviewed.  3 views of the foot were obtained.  Calcaneal spurring present.  No evidence of acute fracture.  Decreased calcaneal inclination well. -Discussed steroid injection.  Discussed traction, icing on regular basis as well as shoes and good arch support, bracing as needed as well. -Sharply debrided the nails x 10 without any complications or bleeding  Vivi Barrack DPM

## 2022-06-19 DIAGNOSIS — Z7901 Long term (current) use of anticoagulants: Secondary | ICD-10-CM | POA: Diagnosis not present

## 2022-06-25 ENCOUNTER — Other Ambulatory Visit (HOSPITAL_BASED_OUTPATIENT_CLINIC_OR_DEPARTMENT_OTHER): Payer: Self-pay

## 2022-06-25 DIAGNOSIS — F339 Major depressive disorder, recurrent, unspecified: Secondary | ICD-10-CM

## 2022-06-25 MED ORDER — CITALOPRAM HYDROBROMIDE 10 MG PO TABS: 10.0000 mg | ORAL_TABLET | Freq: Every day | ORAL | 3 refills | Status: AC

## 2022-07-02 DIAGNOSIS — M25551 Pain in right hip: Secondary | ICD-10-CM | POA: Diagnosis not present

## 2022-07-12 DIAGNOSIS — R6889 Other general symptoms and signs: Secondary | ICD-10-CM | POA: Diagnosis not present

## 2022-07-12 DIAGNOSIS — G5603 Carpal tunnel syndrome, bilateral upper limbs: Secondary | ICD-10-CM | POA: Diagnosis not present

## 2022-07-12 DIAGNOSIS — C9001 Multiple myeloma in remission: Secondary | ICD-10-CM | POA: Diagnosis not present

## 2022-07-13 ENCOUNTER — Emergency Department (HOSPITAL_COMMUNITY): Payer: Medicare Other

## 2022-07-13 ENCOUNTER — Other Ambulatory Visit: Payer: Self-pay

## 2022-07-13 ENCOUNTER — Emergency Department (HOSPITAL_COMMUNITY)
Admission: EM | Admit: 2022-07-13 | Discharge: 2022-07-13 | Disposition: A | Discharge status: Home / Self Care | Payer: Medicare Other | Attending: Emergency Medicine | Admitting: Emergency Medicine

## 2022-07-13 ENCOUNTER — Encounter (HOSPITAL_COMMUNITY): Payer: Self-pay

## 2022-07-13 DIAGNOSIS — M25551 Pain in right hip: Secondary | ICD-10-CM | POA: Diagnosis not present

## 2022-07-13 DIAGNOSIS — Y93H2 Activity, gardening and landscaping: Secondary | ICD-10-CM | POA: Insufficient documentation

## 2022-07-13 DIAGNOSIS — G8929 Other chronic pain: Secondary | ICD-10-CM | POA: Diagnosis not present

## 2022-07-13 DIAGNOSIS — Z7901 Long term (current) use of anticoagulants: Secondary | ICD-10-CM | POA: Diagnosis not present

## 2022-07-13 DIAGNOSIS — Z8546 Personal history of malignant neoplasm of prostate: Secondary | ICD-10-CM | POA: Insufficient documentation

## 2022-07-13 DIAGNOSIS — M25511 Pain in right shoulder: Secondary | ICD-10-CM | POA: Insufficient documentation

## 2022-07-13 DIAGNOSIS — X501XXA Overexertion from prolonged static or awkward postures, initial encounter: Secondary | ICD-10-CM | POA: Diagnosis not present

## 2022-07-13 DIAGNOSIS — S8991XA Unspecified injury of right lower leg, initial encounter: Secondary | ICD-10-CM | POA: Diagnosis present

## 2022-07-13 DIAGNOSIS — G5601 Carpal tunnel syndrome, right upper limb: Secondary | ICD-10-CM | POA: Diagnosis not present

## 2022-07-13 DIAGNOSIS — Z86711 Personal history of pulmonary embolism: Secondary | ICD-10-CM | POA: Insufficient documentation

## 2022-07-13 DIAGNOSIS — M25519 Pain in unspecified shoulder: Secondary | ICD-10-CM | POA: Diagnosis not present

## 2022-07-13 DIAGNOSIS — S8391XA Sprain of unspecified site of right knee, initial encounter: Secondary | ICD-10-CM | POA: Diagnosis not present

## 2022-07-13 DIAGNOSIS — M25461 Effusion, right knee: Secondary | ICD-10-CM | POA: Diagnosis not present

## 2022-07-13 MED ORDER — ACETAMINOPHEN 325 MG PO TABS
650.0000 mg | ORAL_TABLET | Freq: Once | ORAL | Status: AC
  Administered 2022-07-13: 650 mg via ORAL
  Filled 2022-07-13: qty 2

## 2022-07-13 MED ORDER — OXYCODONE-ACETAMINOPHEN 5-325 MG PO TABS
1.0000 | ORAL_TABLET | Freq: Once | ORAL | Status: AC
  Administered 2022-07-13: 1 via ORAL
  Filled 2022-07-13: qty 1

## 2022-07-13 MED ORDER — LIDOCAINE 5 % EX OINT: 1.0000 | TOPICAL_OINTMENT | Freq: Three times a day (TID) | CUTANEOUS | 0 refills | Status: AC | PRN

## 2022-07-13 NOTE — ED Triage Notes (Signed)
Pt c/o R sided wrist, hip, shoulder and knee pain. Hx of carpal tunnel. States that he feels like his R leg twisted funny when getting off the riding lawn mower about a week ago. Has MRI of R hip scheduled for next Tuesday due to acute on chronic pain.

## 2022-07-13 NOTE — Discharge Instructions (Addendum)
Please follow-up with your surgeon regarding your carpal tunnel.  I also recommend following up with an orthopedist regarding your right knee pain.  As we discussed you are on a blood thinner which is the treatment for DVT however in I do think it is reasonable for you to have a ultrasound if your symptoms persist.  Please contact your primary care doctor regarding this.  He may also come to [name] between 7 AM and 7 PM to have an ultrasound done.  I have also given you a wrist splint to use at night time as this can dramatically help carpal tunnel pain.   Use the lidocaine ointment as prescribed. Tylenol 1000mg  every 6 hours Ice your R knee and wear compression ace wrap

## 2022-07-13 NOTE — ED Provider Notes (Signed)
EMERGENCY DEPARTMENT AT [facility] Provider Note   CSN: [id number] Arrival date & time: 07/13/22  0246     History {Add pertinent medical, surgical, social history, OB history to HPI:1} Chief Complaint  Patient presents with   Wrist Pain   Knee Pain   Hip Pain   Shoulder Pain    [name] is a 82 y.o. male.   Wrist Pain  Knee Pain Hip Pain  Shoulder Pain  Patient is a 82 year old male with a past medical history significant for prostate cancer, reflux, PE on warfarin, multiple myeloma, anemia, pancreatitis, arthritis, carpal tunnel of right wrist, chronic right shoulder pain   Patient presents emergency room today with complaints of primarily right wrist pain.  He states this has been ongoing for months and he has had a nerve conduction study with results consistent with carpal tunnel.  He follows with Dr. Franky Macho with neurosurgery and he informs me that Dr. Franky Macho is willing to do surgery to treat his carpal tunnel.  He takes gabapentin for pain.  He is unable to take NSAIDs because he is on warfarin also age.  He also informs me that he has some right knee pain that began 1 week ago when he stepped off his riding lawnmower and feels that he twisted his knee.  He did not notice any popping or clicking is new.  He stated his it has been achy and uncomfortable with walking but does not hurt when he is laying down and bending his knee.  He denies any lacerations abrasions or bleeding.  No fall or head injury.  He is scheduled for right hip MRI     Home Medications Prior to Admission medications   Medication Sig Start Date End Date Taking? Authorizing Provider  acetaminophen (TYLENOL) 500 MG tablet Take 500 mg by mouth every 6 (six) hours as needed for headache (pain).    [provider]  Bioflavonoid Products (ESTER-C PO) Take 1,000 mg by mouth 2 (two) times daily.    [provider]  Calcium Carbonate-Vitamin D (CALTRATE  600+D PO) Take 1 tablet by mouth 2 (two) times daily.    [provider]  cetirizine (ZYRTEC) 10 MG chewable tablet Chew 10 mg by mouth daily.    [provider]  Cholecalciferol (VITAMIN D) 1000 UNITS capsule Take 4,000 Units by mouth in the morning.    [provider]  citalopram (CELEXA) 10 MG tablet Take 1 tablet (10 mg total) by mouth daily. 06/25/22   [name], FNP  Cyanocobalamin (VITAMIN B-12 PO) Take 1 tablet by mouth in the morning.    [provider]  gabapentin (NEURONTIN) 300 MG capsule TAKE 3 CAPSULES BY MOUTH AT BEDTIME**PATIENT NEEDS APT FOR FURTHER REFILLS OF THIS MEDICATION** Patient taking differently: Take 600 mg by mouth at bedtime. 05/18/21   [place], Buren Kos, MD  hydrocortisone (ANUSOL-HC) 2.5 % rectal cream Place 1 Application rectally 2 (two) times daily as needed for hemorrhoids.    [provider]  levothyroxine (SYNTHROID) 100 MCG tablet TAKE 1 TABLET BY MOUTH DAILY Patient not taking: Reported on 01/16/2022 06/06/20   Shelva Majestic, MD  levothyroxine (SYNTHROID) 112 MCG tablet Take 112 mcg by mouth in the morning.    [provider]  Multiple Vitamins-Minerals (EYE MULTIVITAMIN/LUTEIN) TABS Take 1 tablet by mouth in the morning.    [provider]  MYRBETRIQ 25 MG TB24 tablet TAKE 1 TABLET BY MOUTH EVERY DAY Patient not taking:  Reported on 01/16/2022 10/14/21   Malen Gauze, MD  nitroGLYCERIN (NITRODUR - DOSED IN MG/24 HR) 0.2 mg/hr patch PLACE 1/4 TO 1/2 OF PATCH OVER AFFECTED REGION. REMOVE AND REPLACE ONCE DAILY. SLIGHTLY ALTER SKIN PLACEMENT DAILY Patient taking differently: Place 0.05-0.1 mg onto the skin daily as needed (strenuous activity). 01/30/21   [place], Buren Kos, MD  omeprazole (PRILOSEC) 20 MG capsule Take 1 capsule (20 mg total) by mouth 2 (two) times daily. 05/24/22   [place], Buren Kos, MD  OVER THE COUNTER MEDICATION Take 1 tablet by mouth in the morning. Biotin Plus 5000  mcg - 10 mg    [provider]  POTASSIUM CHLORIDE PO Take 200 mEq by mouth at bedtime.    [provider]  potassium chloride SA (KLOR-CON M) 20 MEQ tablet TAKE 1 TABLET BY MOUTH EVERY DAY Patient not taking: Reported on 01/16/2022 11/21/21   [place], Buren Kos, MD  tiZANidine (ZANAFLEX) 4 MG tablet Take 1 tablet (4 mg total) by mouth every 6 (six) hours as needed for muscle spasms. 01/17/22   Coletta Memos, MD  tolterodine (DETROL LA) 4 MG 24 hr capsule TAKE 1 CAPSULE BY MOUTH EVERY DAY Patient taking differently: Take 4 mg by mouth at bedtime. 01/12/22   [name], [name], MD  warfarin (COUMADIN) 5 MG tablet TAKE ONE TABLET BY MOUTH DAILY EXCEPT MONDAY AND FRIDAY TAKE TAKE 1/2 TABLET BY MOUTH DAILY ON MONDAYS AND FRIDAYS Patient taking differently: Take 5 mg by mouth at bedtime. 02/23/21   [place], Raymond J, MD      Allergies    Blood-group specific substance and Prednisone    Review of Systems   Review of Systems  Physical Exam Updated Vital Signs BP 127/87 (BP Location: Left Arm)   Pulse 71   Temp 97.7 F (36.5 C)   Resp 16   Ht 5\' 11"  (1.803 m)   Wt 89.9 kg   SpO2 93%   BMI 27.64 kg/m  Physical Exam Vitals and nursing note reviewed.  Constitutional:      General: He is not in acute distress.    Appearance: Normal appearance. He is not ill-appearing.  HENT:     Head: Normocephalic and atraumatic.     Nose: Nose normal.  Eyes:     General: No scleral icterus.       Right eye: No discharge.        Left eye: No discharge.     Conjunctiva/sclera: Conjunctivae normal.  Cardiovascular:     Rate and Rhythm: Normal rate and regular rhythm.     Pulses: Normal pulses.     Heart sounds: Normal heart sounds.  Pulmonary:     Effort: Pulmonary effort is normal. No respiratory distress.  Abdominal:     Palpations: Abdomen is soft.     Tenderness: There is no abdominal tenderness. There is no guarding or rebound.  Musculoskeletal:     Cervical back: Normal  range of motion.     Right lower leg: No edema.     Left lower leg: No edema.     Comments: Full range of motion of bilateral upper and lower extremities, no focal wrist tenderness, there is wasting of the right thenar eminence.  There is decrease sensation in the right first second and third digit normal sensation in fourth and fifth digit.  Cap refill less than 2 seconds in all fingertips.  Left upper extremity normal.  Bilateral lower extremities normal F ROM of  right knee  No shoulder tenderness.  Skin:    General: Skin is warm and dry.     Capillary Refill: Capillary refill takes less than 2 seconds.  Neurological:     Mental Status: He is alert and oriented to person, place, and time. Mental status is at baseline.  Psychiatric:        Mood and Affect: Mood normal.        Behavior: Behavior normal.     ED Results / Procedures / Treatments   Labs (all labs ordered are listed, but only abnormal results are displayed) Labs Reviewed - No data to display  EKG None  Radiology DG Knee Complete 4 Views Right  Result Date: 07/13/2022 CLINICAL DATA:  Knee pain for 1 week after twisting injury. EXAM: RIGHT KNEE - COMPLETE 4+ VIEW COMPARISON:  None Available. FINDINGS: No evidence of fracture, dislocation, or joint effusion. Joint spaces maintained. Enthesopathic changes are present at the patella. There is a trace joint effusion. Vascular calcifications are present in the soft tissues. IMPRESSION: 1. No acute fracture or dislocation. 2. Trace suprapatellar joint effusion. Electronically Signed   By: Thornell Sartorius M.D.   On: 07/13/2022 04:45    Procedures Procedures  {Document cardiac monitor, telemetry assessment procedure when appropriate:1}  Medications Ordered in ED Medications  oxyCODONE-acetaminophen (PERCOCET/ROXICET) 5-325 MG per tablet 1 tablet (1 tablet Oral Given 07/13/22 0452)  acetaminophen (TYLENOL) tablet 650 mg (650 mg Oral Given 07/13/22 0452)    ED Course/ Medical  Decision Making/ A&P   {   Click here for ABCD2, HEART and other calculatorsREFRESH Note before signing :1}                          Medical Decision Making Amount and/or Complexity of Data Reviewed Radiology: ordered.  Risk OTC drugs. Prescription drug management.            Final Clinical Impression(s) / ED Diagnoses Final diagnoses:  Carpal tunnel syndrome of right wrist  Sprain of right knee, unspecified ligament, initial encounter  Chronic right shoulder pain    Rx / DC Orders ED Discharge Orders     None

## 2022-07-16 DIAGNOSIS — G5601 Carpal tunnel syndrome, right upper limb: Secondary | ICD-10-CM | POA: Diagnosis not present

## 2022-07-17 DIAGNOSIS — M5127 Other intervertebral disc displacement, lumbosacral region: Secondary | ICD-10-CM | POA: Diagnosis not present

## 2022-07-17 DIAGNOSIS — M48061 Spinal stenosis, lumbar region without neurogenic claudication: Secondary | ICD-10-CM | POA: Diagnosis not present

## 2022-07-17 DIAGNOSIS — M47817 Spondylosis without myelopathy or radiculopathy, lumbosacral region: Secondary | ICD-10-CM | POA: Diagnosis not present

## 2022-07-17 DIAGNOSIS — M7138 Other bursal cyst, other site: Secondary | ICD-10-CM | POA: Diagnosis not present

## 2022-07-23 ENCOUNTER — Telehealth: Payer: Self-pay

## 2022-07-23 DIAGNOSIS — M25519 Pain in unspecified shoulder: Secondary | ICD-10-CM | POA: Diagnosis not present

## 2022-07-23 NOTE — Telephone Encounter (Signed)
Transition Care Management Unsuccessful Follow-up Telephone Call  Date of discharge and from where:  07/13/2022 [facility]  Attempts:  1st Attempt  Reason for unsuccessful TCM follow-up call:  Voice mail full  Azka Steger [facility]  [facility] Population Health Community Resource Care Guide   [email or web address]  ?? [phone]   Website: [email or web address]  [email or web address]

## 2022-07-24 ENCOUNTER — Telehealth: Payer: Self-pay

## 2022-07-24 DIAGNOSIS — M4316 Spondylolisthesis, lumbar region: Secondary | ICD-10-CM | POA: Diagnosis not present

## 2022-07-24 DIAGNOSIS — G5601 Carpal tunnel syndrome, right upper limb: Secondary | ICD-10-CM | POA: Diagnosis not present

## 2022-07-24 DIAGNOSIS — M5136 Other intervertebral disc degeneration, lumbar region: Secondary | ICD-10-CM | POA: Diagnosis not present

## 2022-07-24 NOTE — Telephone Encounter (Signed)
Transition Care Management Unsuccessful Follow-up Telephone Call  Date of discharge and from where:  07/13/2022 [facility]  Attempts:  2nd Attempt  Reason for unsuccessful TCM follow-up call:  Unable to leave message  Slade Pierpoint [facility]  [facility] Population Health Community Resource Care Guide   [email or web address]  ?? [phone]   Website: [email or web address]  [email or web address]

## 2022-07-25 ENCOUNTER — Other Ambulatory Visit: Payer: Self-pay | Admitting: Neurosurgery

## 2022-07-25 DIAGNOSIS — M25519 Pain in unspecified shoulder: Secondary | ICD-10-CM | POA: Diagnosis not present

## 2022-07-31 DIAGNOSIS — Z7901 Long term (current) use of anticoagulants: Secondary | ICD-10-CM | POA: Diagnosis not present

## 2022-07-31 DIAGNOSIS — I441 Atrioventricular block, second degree: Secondary | ICD-10-CM | POA: Diagnosis not present

## 2022-07-31 DIAGNOSIS — M25561 Pain in right knee: Secondary | ICD-10-CM | POA: Diagnosis not present

## 2022-08-01 NOTE — Progress Notes (Signed)
Surgical Instructions    Your procedure is scheduled on Wednesday 08/08/22.   Report to [facility] Main Entrance "A" at 11:45 A.M., then check in with the Admitting office.  Call this number if you have problems the morning of surgery:  [phone]   If you have any questions prior to your surgery date call [phone]: Open Monday-Friday 8am-4pm If you experience any cold or flu symptoms such as cough, fever, chills, shortness of breath, etc. between now and your scheduled surgery, please notify us at the above number     Remember:  Do not eat after midnight the night before your surgery  You may drink clear liquids until 10:45 A.M. the morning of your surgery.   Clear liquids allowed are: Water, Non-Citrus Juices (without pulp), Carbonated Beverages, Clear Tea, Black Coffee ONLY (NO MILK, CREAM OR POWDERED CREAMER of any kind), and Gatorade    Take these medicines the morning of surgery with A SIP OF WATER:   cetirizine (ZYRTEC)   levothyroxine (SYNTHROID)   omeprazole (PRILOSEC)     Take these medicines if needed:   acetaminophen (TYLENOL)   tiZANidine (ZANAFLEX)   Please follow your surgeon's instructions regarding warfarin (COUMADIN). If you have not received instructions then please contact your surgeon's office for instructions.   As of today, STOP taking any Aspirin (unless otherwise instructed by your surgeon) Aleve, Naproxen, Ibuprofen, Motrin, Advil, Goody's, BC's, all herbal medications, fish oil, and all vitamins.           Do not wear jewelry or makeup. Do not wear lotions, powders, perfumes/cologne or deodorant. Do not shave 48 hours prior to surgery.  Men may shave face and neck. Do not bring valuables to the hospital. Do not wear nail polish, gel polish, artificial nails, or any other type of covering on natural nails (fingers and toes) If you have artificial nails or gel coating that need to be removed by a nail salon, please have this removed prior to  surgery. Artificial nails or gel coating may interfere with anesthesia's ability to adequately monitor your vital signs.  [facility] is not responsible for any belongings or valuables.    Do NOT Smoke (Tobacco/Vaping)  24 hours prior to your procedure  If you use a CPAP at night, you may bring your mask for your overnight stay.   Contacts, glasses, hearing aids, dentures or partials may not be worn into surgery, please bring cases for these belongings   For patients admitted to the hospital, discharge time will be determined by your treatment team.   Patients discharged the day of surgery will not be allowed to drive home, and someone needs to stay with them for 24 hours.   SURGICAL WAITING ROOM VISITATION Patients having surgery or a procedure may have no more than 2 support people in the waiting area - these visitors may rotate.   Children under the age of 33 must have an adult with them who is not the patient. If the patient needs to stay at the hospital during part of their recovery, the visitor guidelines for inpatient rooms apply. Pre-op nurse will coordinate an appropriate time for 1 support person to accompany patient in pre-op.  This support person may not rotate.   Please refer to [email or web address] for the visitor guidelines for Inpatients (after your surgery is over and you are in a regular room).    Special instructions:    Oral Hygiene is also important to reduce your risk of infection.  Remember -  BRUSH YOUR TEETH THE MORNING OF SURGERY WITH YOUR REGULAR TOOTHPASTE   [place]- Preparing For Surgery  Before surgery, you can play an important role. Because skin is not sterile, your skin needs to be as free of germs as possible. You can reduce the number of germs on your skin by washing with CHG (chlorahexidine gluconate) Soap before surgery.  CHG is an antiseptic cleaner which kills germs and bonds with the skin to  continue killing germs even after washing.     Please do not use if you have an allergy to CHG or antibacterial soaps. If your skin becomes reddened/irritated stop using the CHG.  Do not shave (including legs and underarms) for at least 48 hours prior to first CHG shower. It is OK to shave your face.  Please follow these instructions carefully.     Shower the NIGHT BEFORE SURGERY and the MORNING OF SURGERY with CHG Soap.   If you chose to wash your hair, wash your hair first as usual with your normal shampoo. After you shampoo, rinse your hair and body thoroughly to remove the shampoo.  Then [employer] and genitals (private parts) with your normal soap and rinse thoroughly to remove soap.  After that Use CHG Soap as you would any other liquid soap. You can apply CHG directly to the skin and wash gently with a scrungie or a clean washcloth.   Apply the CHG Soap to your body ONLY FROM THE NECK DOWN.  Do not use on open wounds or open sores. Avoid contact with your eyes, ears, mouth and genitals (private parts). Wash Face and genitals (private parts)  with your normal soap.   Wash thoroughly, paying special attention to the area where your surgery will be performed.  Thoroughly rinse your body with warm water from the neck down.  DO NOT shower/wash with your normal soap after using and rinsing off the CHG Soap.  Pat yourself dry with a CLEAN TOWEL.  Wear CLEAN PAJAMAS to bed the night before surgery  Place CLEAN SHEETS on your bed the night before your surgery  DO NOT SLEEP WITH PETS.   Day of Surgery:  Take a shower with CHG soap. Wear Clean/Comfortable clothing the morning of surgery Do not apply any deodorants/lotions.   Remember to brush your teeth WITH YOUR REGULAR TOOTHPASTE.    If you received a COVID test during your pre-op visit, it is requested that you wear a mask when out in public, stay away from anyone that may not be feeling well, and notify your surgeon if you  develop symptoms. If you have been in contact with anyone that has tested positive in the last 10 days, please notify your surgeon.    Please read over the following fact sheets that you were given.

## 2022-08-02 ENCOUNTER — Inpatient Hospital Stay (HOSPITAL_COMMUNITY)
Admission: RE | Admit: 2022-08-02 | Discharge: 2022-08-02 | Disposition: A | Discharge status: Home / Self Care | Payer: Medicare Other | Source: Ambulatory Visit

## 2022-08-02 NOTE — Progress Notes (Signed)
Patient did not show for PAT appointment. I called patient. Per patient's wife, he would possibly be admitted a couple of days before surgery to be placed on a Heparin Gtt prior to surgery. She stated that she was told that if he would be admitted, then he would not need to come for pre-op appointment, but she had not heard the plan yet. I told her to reach out to Dr. Sueanne Margarita office regarding this. I left a message with Dr. Sueanne Margarita surgery scheduler regarding this information as well.   Pt's wife states that she was given coumadin instructions for him, and his last dose was on 08/01/22.   I gave her the pharmacy call center's number to call and review his medications as well.   PAT scheduler Dondra Spry notified that patient may be admitted, or may need to be re-scheduled for appointment/pre-op call.

## 2022-08-07 ENCOUNTER — Inpatient Hospital Stay (HOSPITAL_COMMUNITY)
Admission: AD | Admit: 2022-08-07 | Discharge: 2022-08-15 | DRG: 460 | Disposition: A | Discharge status: Home Health | Payer: Medicare Other | Attending: Neurosurgery | Admitting: Neurosurgery

## 2022-08-07 ENCOUNTER — Encounter (HOSPITAL_COMMUNITY): Payer: Self-pay | Admitting: Neurosurgery

## 2022-08-07 ENCOUNTER — Other Ambulatory Visit: Payer: Self-pay

## 2022-08-07 DIAGNOSIS — M47816 Spondylosis without myelopathy or radiculopathy, lumbar region: Secondary | ICD-10-CM | POA: Diagnosis present

## 2022-08-07 DIAGNOSIS — Z832 Family history of diseases of the blood and blood-forming organs and certain disorders involving the immune mechanism: Secondary | ICD-10-CM | POA: Diagnosis not present

## 2022-08-07 DIAGNOSIS — Z87891 Personal history of nicotine dependence: Secondary | ICD-10-CM | POA: Diagnosis not present

## 2022-08-07 DIAGNOSIS — G8929 Other chronic pain: Principal | ICD-10-CM

## 2022-08-07 DIAGNOSIS — Z79890 Hormone replacement therapy (postmenopausal): Secondary | ICD-10-CM

## 2022-08-07 DIAGNOSIS — Z8781 Personal history of (healed) traumatic fracture: Secondary | ICD-10-CM

## 2022-08-07 DIAGNOSIS — Z4789 Encounter for other orthopedic aftercare: Secondary | ICD-10-CM | POA: Diagnosis not present

## 2022-08-07 DIAGNOSIS — Z8546 Personal history of malignant neoplasm of prostate: Secondary | ICD-10-CM | POA: Diagnosis not present

## 2022-08-07 DIAGNOSIS — K219 Gastro-esophageal reflux disease without esophagitis: Secondary | ICD-10-CM | POA: Diagnosis present

## 2022-08-07 DIAGNOSIS — Z8042 Family history of malignant neoplasm of prostate: Secondary | ICD-10-CM

## 2022-08-07 DIAGNOSIS — Z7901 Long term (current) use of anticoagulants: Secondary | ICD-10-CM | POA: Diagnosis not present

## 2022-08-07 DIAGNOSIS — Z79899 Other long term (current) drug therapy: Secondary | ICD-10-CM | POA: Diagnosis not present

## 2022-08-07 DIAGNOSIS — M25511 Pain in right shoulder: Secondary | ICD-10-CM | POA: Diagnosis not present

## 2022-08-07 DIAGNOSIS — Z86711 Personal history of pulmonary embolism: Secondary | ICD-10-CM

## 2022-08-07 DIAGNOSIS — D689 Coagulation defect, unspecified: Secondary | ICD-10-CM | POA: Diagnosis present

## 2022-08-07 DIAGNOSIS — Z9049 Acquired absence of other specified parts of digestive tract: Secondary | ICD-10-CM

## 2022-08-07 DIAGNOSIS — Z981 Arthrodesis status: Secondary | ICD-10-CM | POA: Diagnosis not present

## 2022-08-07 DIAGNOSIS — Z807 Family history of other malignant neoplasms of lymphoid, hematopoietic and related tissues: Secondary | ICD-10-CM

## 2022-08-07 DIAGNOSIS — M4316 Spondylolisthesis, lumbar region: Secondary | ICD-10-CM | POA: Diagnosis present

## 2022-08-07 DIAGNOSIS — Z888 Allergy status to other drugs, medicaments and biological substances status: Secondary | ICD-10-CM | POA: Diagnosis not present

## 2022-08-07 DIAGNOSIS — M48062 Spinal stenosis, lumbar region with neurogenic claudication: Secondary | ICD-10-CM | POA: Diagnosis not present

## 2022-08-07 DIAGNOSIS — E039 Hypothyroidism, unspecified: Secondary | ICD-10-CM | POA: Diagnosis not present

## 2022-08-07 DIAGNOSIS — Z833 Family history of diabetes mellitus: Secondary | ICD-10-CM | POA: Diagnosis not present

## 2022-08-07 DIAGNOSIS — M7138 Other bursal cyst, other site: Secondary | ICD-10-CM

## 2022-08-07 DIAGNOSIS — D638 Anemia in other chronic diseases classified elsewhere: Secondary | ICD-10-CM | POA: Diagnosis not present

## 2022-08-07 LAB — CBC WITH DIFFERENTIAL/PLATELET
Abs Immature Granulocytes: 0.07 10*3/uL (ref 0.00–0.07)
Basophils Absolute: 0.1 10*3/uL (ref 0.0–0.1)
Basophils Relative: 1 %
Eosinophils Absolute: 0.1 10*3/uL (ref 0.0–0.5)
Eosinophils Relative: 1 %
HCT: 41.4 % (ref 39.0–52.0)
Hemoglobin: 13.6 g/dL (ref 13.0–17.0)
Immature Granulocytes: 1 %
Lymphocytes Relative: 11 %
Lymphs Abs: 1 10*3/uL (ref 0.7–4.0)
MCH: 30.5 pg (ref 26.0–34.0)
MCHC: 32.9 g/dL (ref 30.0–36.0)
MCV: 92.8 fL (ref 80.0–100.0)
Monocytes Absolute: 1.3 10*3/uL — ABNORMAL HIGH (ref 0.1–1.0)
Monocytes Relative: 13 %
Neutro Abs: 7.1 10*3/uL (ref 1.7–7.7)
Neutrophils Relative %: 73 %
Platelets: 217 10*3/uL (ref 150–400)
RBC: 4.46 MIL/uL (ref 4.22–5.81)
RDW: 13.5 % (ref 11.5–15.5)
WBC: 9.7 10*3/uL (ref 4.0–10.5)
nRBC: 0 % (ref 0.0–0.2)

## 2022-08-07 LAB — URINALYSIS, ROUTINE W REFLEX MICROSCOPIC
Bacteria, UA: NONE SEEN
Bilirubin Urine: NEGATIVE
Glucose, UA: NEGATIVE mg/dL
Hgb urine dipstick: NEGATIVE
Ketones, ur: NEGATIVE mg/dL
Leukocytes,Ua: NEGATIVE
Nitrite: NEGATIVE
Protein, ur: 30 mg/dL — AB
Specific Gravity, Urine: 1.024 (ref 1.005–1.030)
pH: 6 (ref 5.0–8.0)

## 2022-08-07 LAB — COMPREHENSIVE METABOLIC PANEL
ALT: 30 U/L (ref 0–44)
AST: 19 U/L (ref 15–41)
Albumin: 3 g/dL — ABNORMAL LOW (ref 3.5–5.0)
Alkaline Phosphatase: 92 U/L (ref 38–126)
Anion gap: 7 (ref 5–15)
BUN: 21 mg/dL (ref 8–23)
CO2: 21 mmol/L — ABNORMAL LOW (ref 22–32)
Calcium: 8.3 mg/dL — ABNORMAL LOW (ref 8.9–10.3)
Chloride: 106 mmol/L (ref 98–111)
Creatinine, Ser: 1.2 mg/dL (ref 0.61–1.24)
GFR, Estimated: >60 mL/min (ref >60)
Glucose, Bld: 92 mg/dL (ref 70–99)
Potassium: 4.3 mmol/L (ref 3.5–5.1)
Sodium: 134 mmol/L — ABNORMAL LOW (ref 135–145)
Total Bilirubin: 0.7 mg/dL (ref 0.3–1.2)
Total Protein: 6.4 g/dL — ABNORMAL LOW (ref 6.5–8.1)

## 2022-08-07 LAB — SURGICAL PCR SCREEN
MRSA, PCR: NEGATIVE
Staphylococcus aureus: POSITIVE — AB

## 2022-08-07 LAB — PROTIME-INR
INR: 1.1 (ref 0.8–1.2)
Prothrombin Time: 14.6 seconds (ref 11.4–15.2)

## 2022-08-07 LAB — APTT: aPTT: 27 seconds (ref 24–36)

## 2022-08-07 MED ORDER — METHOCARBAMOL 1000 MG/10ML IJ SOLN: 500.0000 mg | Freq: Four times a day (QID) | INTRAVENOUS | Status: DC | PRN

## 2022-08-07 MED ORDER — OXYCODONE HCL 5 MG PO TABS
5.0000 mg | ORAL_TABLET | ORAL | Status: DC | PRN
  Administered 2022-08-08 – 2022-08-14 (×5): 5 mg via ORAL
  Filled 2022-08-07 (×4): qty 1

## 2022-08-07 MED ORDER — POTASSIUM CHLORIDE IN NACL 20-0.9 MEQ/L-% IV SOLN
INTRAVENOUS | Status: DC
  Filled 2022-08-07 (×2): qty 1000

## 2022-08-07 MED ORDER — HEPARIN SODIUM (PORCINE) 5000 UNIT/ML IJ SOLN
5000.0000 [IU] | Freq: Three times a day (TID) | INTRAMUSCULAR | Status: DC
  Administered 2022-08-07: 5000 [IU] via SUBCUTANEOUS
  Filled 2022-08-07 (×2): qty 1

## 2022-08-07 MED ORDER — MORPHINE SULFATE (PF) 2 MG/ML IV SOLN
2.0000 mg | INTRAVENOUS | Status: DC | PRN
  Administered 2022-08-08: 2 mg via INTRAVENOUS
  Filled 2022-08-07: qty 1

## 2022-08-07 MED ORDER — ONDANSETRON HCL 4 MG/2ML IJ SOLN: 4.0000 mg | Freq: Four times a day (QID) | INTRAMUSCULAR | Status: DC | PRN

## 2022-08-07 MED ORDER — ACETAMINOPHEN 325 MG PO TABS
650.0000 mg | ORAL_TABLET | Freq: Four times a day (QID) | ORAL | Status: DC | PRN
  Administered 2022-08-10 – 2022-08-14 (×3): 650 mg via ORAL
  Filled 2022-08-07 (×3): qty 2

## 2022-08-07 MED ORDER — ACETAMINOPHEN 650 MG RE SUPP: 650.0000 mg | Freq: Four times a day (QID) | RECTAL | Status: DC | PRN

## 2022-08-07 MED ORDER — HEPARIN (PORCINE) 25000 UT/250ML-% IV SOLN
1600.0000 [IU]/h | INTRAVENOUS | Status: DC
  Administered 2022-08-07: 1400 [IU]/h via INTRAVENOUS
  Filled 2022-08-07: qty 250

## 2022-08-07 MED ORDER — MUPIROCIN 2 % EX OINT
1.0000 | TOPICAL_OINTMENT | Freq: Two times a day (BID) | CUTANEOUS | Status: AC
  Administered 2022-08-07 – 2022-08-11 (×10): 1 via NASAL
  Filled 2022-08-07 (×2): qty 22

## 2022-08-07 MED ORDER — ONDANSETRON HCL 4 MG PO TABS: 4.0000 mg | ORAL_TABLET | Freq: Four times a day (QID) | ORAL | Status: DC | PRN

## 2022-08-07 NOTE — Progress Notes (Addendum)
ANTICOAGULATION CONSULT NOTE  Pharmacy Consult for heparin Indication: VTE prophylaxis  Allergies  Allergen Reactions   Blood-Group Specific Substance     Must be Leukocyte Reduced and Irradiated due to stem cell transplant.    Prednisone Other (See Comments)    Agitation  Anger    Patient Measurements:    Vital Signs: Temp: 97.9 F (36.6 C) (06/04 1125) Temp Source: Oral (06/04 1125) BP: 92/70 (06/04 1125) Pulse Rate: 79 (06/04 1125)  Labs: Recent Labs    08/07/22 1447  HGB 13.6  HCT 41.4  PLT 217  APTT 27  LABPROT 14.6  INR 1.1    CrCl cannot be calculated (Patient's most recent lab result is older than the maximum 21 days allowed.).   Medical History: Past Medical History:  Diagnosis Date   Anemia 02-16-11   01-05-11- post surgery-Transfusions x 4 units   Arthritis    Bone pain 02/18/2013   Cellulitis    Clotting disorder (HCC)    Complication of anesthesia 02-16-11   Pt. speaks of awakening during the surgery from back  surgery   Degenerative disc disease 02-16-11   01-05-11 L3 fusion done   Esophageal reflux 05/31/2008   Qualifier: Diagnosis of  By: Lovell Sheehan MD, Balinda Quails    Hernia 02-16-11   left inguinal hernia at present   Hypothyroidism 02/01/2014   Multiple myeloma 02-16-11   suspected tumor  left sacral   Pancreatitis    Personal history of traumatic fracture 07/10/2012   Prostate cancer [facility])    Pulmonary embolism (HCC)    Second degree atrioventricular block 11/26/2012   Shortness of breath 02-16-11   hx. Pulmonary emboli x2 (9 yrs/ 3'12 -last)      Assessment: 19 yoM admitted for spinal surgery tomorrow. Pt on warfarin PTA for hx PE. Pharmacy consulted to dose heparin for VTE ppx preop. INR 1.1.   Plan:  Heparin 5000 units SQ TID Pharmacy will sign off, reconsult as needed  Fredonia Highland, PharmD, BCPS, [facility] Clinical Pharmacist [phone] Please check AMION for all [facility] Pharmacy numbers 08/07/2022

## 2022-08-07 NOTE — Progress Notes (Signed)
Arrived to [facility] 3W Neuroscience Unit as a direct admit under the care of Dr. Franky Macho. Oriented to room and plan of care.   Dr. Sueanne Margarita office called, spoke with a staff member to notify him of patient's arrival to unit and need for orders.

## 2022-08-07 NOTE — Progress Notes (Signed)
ANTICOAGULATION CONSULT NOTE  Pharmacy Consult for heparin Indication:  hx PE  Allergies  Allergen Reactions   Blood-Group Specific Substance     Must be Leukocyte Reduced and Irradiated due to stem cell transplant.    Prednisone Other (See Comments)    Agitation  Anger    Patient Measurements:    Vital Signs: Temp: 98.3 F (36.8 C) (06/04 2015) Temp Source: Oral (06/04 2015) BP: 124/75 (06/04 2015) Pulse Rate: 74 (06/04 2015)  Labs: Recent Labs    08/07/22 1447  HGB 13.6  HCT 41.4  PLT 217  APTT 27  LABPROT 14.6  INR 1.1  CREATININE 1.20     CrCl cannot be calculated (Unknown ideal weight.).   Medical History: Past Medical History:  Diagnosis Date   Anemia 02-16-11   01-05-11- post surgery-Transfusions x 4 units   Arthritis    Bone pain 02/18/2013   Cellulitis    Clotting disorder (HCC)    Complication of anesthesia 02-16-11   Pt. speaks of awakening during the surgery from back  surgery   Degenerative disc disease 02-16-11   01-05-11 L3 fusion done   Esophageal reflux 05/31/2008   Qualifier: Diagnosis of  By: Lovell Sheehan MD, Balinda Quails    Hernia 02-16-11   left inguinal hernia at present   Hypothyroidism 02/01/2014   Multiple myeloma 02-16-11   suspected tumor  left sacral   Pancreatitis    Personal history of traumatic fracture 07/10/2012   Prostate cancer [facility])    Pulmonary embolism (HCC)    Second degree atrioventricular block 11/26/2012   Shortness of breath 02-16-11   hx. Pulmonary emboli x2 (9 yrs/ 3'12 -last)      Assessment: 89 yoM admitted for spinal surgery tomorrow. Pt on warfarin PTA for hx PE. Pharmacy consulted to dose heparin preop while holding warfarin, INR is 1.1. SQ heparin given earlier today so will defer bolus.   Plan:  Heparin 1400 units/h Check heparin level in 8h   Fredonia Highland, PharmD, [place], [facility] Clinical Pharmacist [phone] Please check AMION for all [facility] Pharmacy numbers 08/07/2022

## 2022-08-07 NOTE — H&P (Signed)
Rodney [name] is an 82 y.o. male.   Chief Complaint: back and lower extremity pain,  HPI: with a listhesis at L4/5. He is on anticoagulation hx of PE, and clotting disorder. He was admitted for heparin treatment pre and post op.   Past Medical History:  Diagnosis Date   Anemia 02-16-11   01-05-11- post surgery-Transfusions x 4 units   Arthritis    Bone pain 02/18/2013   Cellulitis    Clotting disorder (HCC)    Complication of anesthesia 02-16-11   Pt. speaks of awakening during the surgery from back  surgery   Degenerative disc disease 02-16-11   01-05-11 L3 fusion done   Esophageal reflux 05/31/2008   Qualifier: Diagnosis of  By: Lovell Sheehan MD, Balinda Quails    Hernia 02-16-11   left inguinal hernia at present   Hypothyroidism 02/01/2014   Multiple myeloma 02-16-11   suspected tumor  left sacral   Pancreatitis    Personal history of traumatic fracture 07/10/2012   Prostate cancer [facility])    Pulmonary embolism (HCC)    Second degree atrioventricular block 11/26/2012   Shortness of breath 02-16-11   hx. Pulmonary emboli x2 (9 yrs/ 3'12 -last)    Past Surgical History:  Procedure Laterality Date   APPENDECTOMY     CATARACT EXTRACTION     CHOLECYSTECTOMY  04/06/2010   laparoscopic-inflammation with stones   INGUINAL HERNIA REPAIR  02/20/2011   Procedure: HERNIA REPAIR INGUINAL ADULT;  Surgeon: Velora Heckler, MD;  Location: [facility] ORS;  Service: General;  Laterality: Left;  Repair Left Inguinal Hernia with Mesh   LUMBAR DISC SURGERY  02-16-11    01-05-11 Lumbar surgery L3(complicated by loss of blood volume)/ 01-09-11 then Lumbar fusion done with retained  hardware    LUMBAR LAMINECTOMY/DECOMPRESSION MICRODISCECTOMY Right 01/16/2022   Procedure: Right Lumbar Five- Sacral One Laminectomy for synovial/facet cyst;  Surgeon: Coletta Memos, MD;  Location: [facility] OR;  Service: Neurosurgery;  Laterality: Right;  3C   PROSTATE SURGERY Bilateral 2011   TONSILLECTOMY AND ADENOIDECTOMY      Family History   Problem Relation Age of Onset   Lymphoma Father 54   Prostate cancer Father    Clotting disorder Mother        blood clots   Deep vein thrombosis Mother    Lung disease Brother    Diabetes Son    Diabetes Daughter    Diabetes Daughter    Colon cancer Neg Hx    Esophageal cancer Neg Hx    Rectal cancer Neg Hx    Stomach cancer Neg Hx    Social History:  reports that he quit smoking about 53 years ago. His smoking use included cigarettes. He has never used smokeless tobacco. He reports that he does not drink alcohol and does not use drugs.  Allergies:  Allergies  Allergen Reactions   Blood-Group Specific Substance     Must be Leukocyte Reduced and Irradiated due to stem cell transplant.    Prednisone Other (See Comments)    Agitation  Anger    Medications Prior to Admission  Medication Sig Dispense Refill   acetaminophen (TYLENOL) 500 MG tablet Take 500-1,000 mg by mouth every 6 (six) hours as needed for headache (pain).     Bioflavonoid Products (ESTER-C PO) Take 1,000 mg by mouth daily.     Biotin 5 MG CAPS Take 5 mg by mouth at bedtime.     Calcium Carbonate-Vitamin D (CALTRATE 600+D PO) Take 1 tablet by mouth 2 (  two) times daily.     calcium elemental as carbonate (TUMS ULTRA 1000) 400 MG chewable tablet Chew 1,000-2,000 mg by mouth daily as needed for heartburn.     carboxymethylcellulose (REFRESH PLUS) 0.5 % SOLN Place 1 drop into both eyes daily as needed (dry eyes).     cetirizine (ZYRTEC) 10 MG tablet Take 10 mg by mouth every other day. At night     Cholecalciferol (VITAMIN D) 1000 UNITS capsule Take 1,000 Units by mouth 2 (two) times daily.     citalopram (CELEXA) 10 MG tablet Take 1 tablet (10 mg total) by mouth daily. (Patient taking differently: Take 10 mg by mouth at bedtime.) 30 tablet 3   Cyanocobalamin (B-12) 3000 MCG CAPS Take 3,000 mcg by mouth daily.     Dextromethorphan-guaiFENesin 10-100 MG/5ML liquid Take 5 mLs by mouth 2 (two) times daily as needed  (cough).     gabapentin (NEURONTIN) 300 MG capsule TAKE 3 CAPSULES BY MOUTH AT BEDTIME**PATIENT NEEDS APT FOR FURTHER REFILLS OF THIS MEDICATION** (Patient taking differently: Take 600 mg by mouth at bedtime.) 45 capsule 0   levothyroxine (SYNTHROID) 112 MCG tablet Take 112 mcg by mouth daily before breakfast.     Multiple Vitamin (MULTIVITAMIN WITH MINERALS) TABS tablet Take 1 tablet by mouth daily.     MYRBETRIQ 25 MG TB24 tablet TAKE 1 TABLET BY MOUTH EVERY DAY 30 tablet 11   nitroGLYCERIN (NITRODUR - DOSED IN MG/24 HR) 0.2 mg/hr patch PLACE 1/4 TO 1/2 OF PATCH OVER AFFECTED REGION. REMOVE AND REPLACE ONCE DAILY. SLIGHTLY ALTER SKIN PLACEMENT DAILY (Patient taking differently: Place 0.05-0.2 mg onto the skin See admin instructions. Place 0.05 mg (1/4 patch) to 0.2 mg (full patch) as needed for pain) 30 patch 0   omeprazole (PRILOSEC) 20 MG capsule Take 1 capsule (20 mg total) by mouth 2 (two) times daily. 90 capsule 1   ondansetron (ZOFRAN) 4 MG tablet Take 4 mg by mouth every 8 (eight) hours as needed for nausea or vomiting.     potassium chloride SA (KLOR-CON M) 20 MEQ tablet TAKE 1 TABLET BY MOUTH EVERY DAY (Patient taking differently: Take 20 mEq by mouth at bedtime.) 90 tablet 1   tolterodine (DETROL LA) 4 MG 24 hr capsule TAKE 1 CAPSULE BY MOUTH EVERY DAY (Patient taking differently: Take 4 mg by mouth at bedtime.) 30 capsule 11   triamcinolone (NASACORT ALLERGY 24HR) 55 MCG/ACT AERO nasal inhaler Place 2 sprays into the nose daily as needed (allergies).     warfarin (COUMADIN) 2.5 MG tablet Take 2.5 mg by mouth at bedtime.     lidocaine (XYLOCAINE) 5 % ointment Apply 1 Application topically 3 (three) times daily as needed. (Patient not taking: Reported on 08/03/2022) 35.44 g 0   warfarin (COUMADIN) 5 MG tablet TAKE ONE TABLET BY MOUTH DAILY EXCEPT MONDAY AND FRIDAY TAKE TAKE 1/2 TABLET BY MOUTH DAILY ON MONDAYS AND FRIDAYS (Patient not taking: Reported on 08/03/2022) 30 tablet 0    Results  for orders placed or performed during the hospital encounter of 08/07/22 (from the past 48 hour(s))  Comprehensive metabolic panel     Status: Abnormal   Collection Time: 08/07/22  2:47 PM  Result Value Ref Range   Sodium 134 (L) 135 - 145 mmol/L   Potassium 4.3 3.5 - 5.1 mmol/L   Chloride 106 98 - 111 mmol/L   CO2 21 (L) 22 - 32 mmol/L   Glucose, Bld 92 70 - 99 mg/dL    Comment: Glucose  reference range applies only to samples taken after fasting for at least 8 hours.   BUN 21 8 - 23 mg/dL   Creatinine, Ser 1.61 0.61 - 1.24 mg/dL   Calcium 8.3 (L) 8.9 - 10.3 mg/dL   Total Protein 6.4 (L) 6.5 - 8.1 g/dL   Albumin 3.0 (L) 3.5 - 5.0 g/dL   AST 19 15 - 41 U/L   ALT 30 0 - 44 U/L   Alkaline Phosphatase 92 38 - 126 U/L   Total Bilirubin 0.7 0.3 - 1.2 mg/dL   GFR, Estimated >09 >60 mL/min    Comment: (NOTE) Calculated using the CKD-EPI Creatinine Equation (2021)    Anion gap 7 5 - 15    Comment: Performed at [facility], [address] [place], [place] [address]  CBC with Differential/Platelet     Status: Abnormal   Collection Time: 08/07/22  2:47 PM  Result Value Ref Range   WBC 9.7 4.0 - 10.5 K/uL   RBC 4.46 4.22 - 5.81 MIL/uL   Hemoglobin 13.6 13.0 - 17.0 g/dL   HCT 81.1 91.4 - 78.2 %   MCV 92.8 80.0 - 100.0 fL   MCH 30.5 26.0 - 34.0 pg   MCHC 32.9 30.0 - 36.0 g/dL   RDW 95.6 21.3 - 08.6 %   Platelets 217 150 - 400 K/uL   nRBC 0.0 0.0 - 0.2 %   Neutrophils Relative % 73 %   Neutro Abs 7.1 1.7 - 7.7 K/uL   Lymphocytes Relative 11 %   Lymphs Abs 1.0 0.7 - 4.0 K/uL   Monocytes Relative 13 %   Monocytes Absolute 1.3 (H) 0.1 - 1.0 K/uL   Eosinophils Relative 1 %   Eosinophils Absolute 0.1 0.0 - 0.5 K/uL   Basophils Relative 1 %   Basophils Absolute 0.1 0.0 - 0.1 K/uL   Immature Granulocytes 1 %   Abs Immature Granulocytes 0.07 0.00 - 0.07 K/uL    Comment: Performed at [facility], [address], [place], [place] [address]  Protime-INR     Status: None    Collection Time: 08/07/22  2:47 PM  Result Value Ref Range   Prothrombin Time 14.6 11.4 - 15.2 seconds   INR 1.1 0.8 - 1.2    Comment: (NOTE) INR goal varies based on device and disease states. Performed at [facility], [address] [place], [place] [address]   APTT     Status: None   Collection Time: 08/07/22  2:47 PM  Result Value Ref Range   aPTT 27 24 - 36 seconds    Comment: Performed at [facility], [address], [place], [place] [address]  Surgical PCR screen     Status: Abnormal   Collection Time: 08/07/22  4:04 PM   Specimen: Nasal Mucosa; Nasal Swab  Result Value Ref Range   MRSA, PCR NEGATIVE NEGATIVE   Staphylococcus aureus POSITIVE (A) NEGATIVE    Comment: (NOTE) The Xpert SA Assay (FDA approved for NASAL specimens in patients 32 years of age and older), is one component of a comprehensive surveillance program. It is not intended to diagnose infection nor to guide or monitor treatment. Performed at [facility], [address] [place], [place] [address]    *Note: Due to a large number of results and/or encounters for the requested time period, some results have not been displayed. A complete set of results can be found in Results Review.   No  results found.  Review of Systems  Blood pressure 116/75, pulse 66, temperature 98.8 F (37.1 C), temperature source Oral, resp. rate 17, SpO2 95 %. Physical Exam Constitutional:      Appearance: Normal appearance. He is normal weight.  HENT:     Head: Normocephalic and atraumatic.     Mouth/Throat:     Mouth: Mucous membranes are moist.     Pharynx: Oropharynx is clear.  Eyes:     Extraocular Movements: Extraocular movements intact.     Pupils: Pupils are equal, round, and reactive to light.  Cardiovascular:     Rate and Rhythm: Normal rate and regular rhythm.  Pulmonary:     Effort: Pulmonary effort is normal.  Abdominal:     General: Abdomen is flat.  Musculoskeletal:         General: Normal range of motion.     Cervical back: Normal range of motion.  Skin:    General: Skin is warm and dry.  Neurological:     Mental Status: He is alert and oriented to person, place, and time.     Cranial Nerves: Cranial nerves 2-12 are intact.     Sensory: Sensation is intact.     Motor: Motor function is intact.     Coordination: Coordination is intact.     Deep Tendon Reflexes: Babinski sign absent on the right side. Babinski sign absent on the left side.      Assessment/Plan OR for lumbar decompression and arthrodesis  Coletta Memos, MD 08/07/2022, 8:12 PM

## 2022-08-08 ENCOUNTER — Other Ambulatory Visit: Payer: Self-pay

## 2022-08-08 ENCOUNTER — Encounter (HOSPITAL_COMMUNITY): Payer: Self-pay | Admitting: Neurosurgery

## 2022-08-08 ENCOUNTER — Inpatient Hospital Stay (HOSPITAL_COMMUNITY): Payer: Medicare Other | Admitting: Registered Nurse

## 2022-08-08 ENCOUNTER — Encounter (HOSPITAL_COMMUNITY)
Admission: AD | Disposition: A | Discharge status: Home Health | Payer: Self-pay | Source: Home / Self Care | Attending: Neurosurgery

## 2022-08-08 ENCOUNTER — Inpatient Hospital Stay (HOSPITAL_COMMUNITY): Payer: Medicare Other

## 2022-08-08 DIAGNOSIS — M4316 Spondylolisthesis, lumbar region: Secondary | ICD-10-CM

## 2022-08-08 DIAGNOSIS — D638 Anemia in other chronic diseases classified elsewhere: Secondary | ICD-10-CM | POA: Diagnosis not present

## 2022-08-08 DIAGNOSIS — E039 Hypothyroidism, unspecified: Secondary | ICD-10-CM

## 2022-08-08 DIAGNOSIS — Z87891 Personal history of nicotine dependence: Secondary | ICD-10-CM

## 2022-08-08 LAB — PROTIME-INR
INR: 1.1 (ref 0.8–1.2)
Prothrombin Time: 14.2 seconds (ref 11.4–15.2)

## 2022-08-08 LAB — CBC
HCT: 42.9 % (ref 39.0–52.0)
Hemoglobin: 14.3 g/dL (ref 13.0–17.0)
MCH: 30.7 pg (ref 26.0–34.0)
MCHC: 33.3 g/dL (ref 30.0–36.0)
MCV: 92.1 fL (ref 80.0–100.0)
Platelets: 228 10*3/uL (ref 150–400)
RBC: 4.66 MIL/uL (ref 4.22–5.81)
RDW: 13.4 % (ref 11.5–15.5)
WBC: 10.3 10*3/uL (ref 4.0–10.5)
nRBC: 0 % (ref 0.0–0.2)

## 2022-08-08 LAB — HEPARIN LEVEL (UNFRACTIONATED): Heparin Unfractionated: 0.17 IU/mL — ABNORMAL LOW (ref 0.30–0.70)

## 2022-08-08 LAB — TYPE AND SCREEN
ABO/RH(D): B POS
Antibody Screen: NEGATIVE

## 2022-08-08 SURGERY — POSTERIOR LUMBAR FUSION 1 LEVEL
Anesthesia: General

## 2022-08-08 MED ORDER — FENTANYL CITRATE (PF) 250 MCG/5ML IJ SOLN
INTRAMUSCULAR | Status: AC
  Filled 2022-08-08: qty 5

## 2022-08-08 MED ORDER — CHLORHEXIDINE GLUCONATE 0.12 % MT SOLN
OROMUCOSAL | Status: AC
  Administered 2022-08-08: 15 mL via OROMUCOSAL
  Filled 2022-08-08: qty 15

## 2022-08-08 MED ORDER — PROPOFOL 10 MG/ML IV BOLUS
INTRAVENOUS | Status: DC | PRN
  Administered 2022-08-08: 150 mg via INTRAVENOUS

## 2022-08-08 MED ORDER — ROCURONIUM BROMIDE 10 MG/ML (PF) SYRINGE
PREFILLED_SYRINGE | INTRAVENOUS | Status: DC | PRN
  Administered 2022-08-08: 60 mg via INTRAVENOUS
  Administered 2022-08-08 (×3): 20 mg via INTRAVENOUS
  Administered 2022-08-08: 10 mg via INTRAVENOUS

## 2022-08-08 MED ORDER — LACTATED RINGERS IV SOLN: INTRAVENOUS | Status: DC

## 2022-08-08 MED ORDER — MIDAZOLAM HCL 2 MG/2ML IJ SOLN
INTRAMUSCULAR | Status: AC
  Filled 2022-08-08: qty 2

## 2022-08-08 MED ORDER — ROCURONIUM BROMIDE 10 MG/ML (PF) SYRINGE
PREFILLED_SYRINGE | INTRAVENOUS | Status: AC
  Filled 2022-08-08: qty 10

## 2022-08-08 MED ORDER — LIDOCAINE-EPINEPHRINE 0.5 %-1:200000 IJ SOLN
INTRAMUSCULAR | Status: AC
  Filled 2022-08-08: qty 50

## 2022-08-08 MED ORDER — FENTANYL CITRATE (PF) 100 MCG/2ML IJ SOLN: 25.0000 ug | INTRAMUSCULAR | Status: DC | PRN

## 2022-08-08 MED ORDER — PHENYLEPHRINE HCL-NACL 20-0.9 MG/250ML-% IV SOLN
INTRAVENOUS | Status: DC | PRN
  Administered 2022-08-08: 20 ug/min via INTRAVENOUS

## 2022-08-08 MED ORDER — CHLORHEXIDINE GLUCONATE 0.12 % MT SOLN: 15.0000 mL | OROMUCOSAL | Status: AC

## 2022-08-08 MED ORDER — THROMBIN 20000 UNITS EX SOLR
CUTANEOUS | Status: AC
  Filled 2022-08-08: qty 20000

## 2022-08-08 MED ORDER — CEFAZOLIN SODIUM-DEXTROSE 2-4 GM/100ML-% IV SOLN
2.0000 g | INTRAVENOUS | Status: AC
  Administered 2022-08-08: 2 g via INTRAVENOUS

## 2022-08-08 MED ORDER — EPHEDRINE SULFATE-NACL 50-0.9 MG/10ML-% IV SOSY
PREFILLED_SYRINGE | INTRAVENOUS | Status: DC | PRN
  Administered 2022-08-08: 5 mg via INTRAVENOUS

## 2022-08-08 MED ORDER — PHENYLEPHRINE 80 MCG/ML (10ML) SYRINGE FOR IV PUSH (FOR BLOOD PRESSURE SUPPORT)
PREFILLED_SYRINGE | INTRAVENOUS | Status: AC
  Filled 2022-08-08: qty 10

## 2022-08-08 MED ORDER — CELECOXIB 200 MG PO CAPS: 200.0000 mg | ORAL_CAPSULE | Freq: Once | ORAL | Status: AC

## 2022-08-08 MED ORDER — THROMBIN 20000 UNITS EX SOLR
CUTANEOUS | Status: DC | PRN
  Administered 2022-08-08: 20 mL via TOPICAL

## 2022-08-08 MED ORDER — CHLORHEXIDINE GLUCONATE CLOTH 2 % EX PADS: 6.0000 | MEDICATED_PAD | Freq: Once | CUTANEOUS | Status: DC

## 2022-08-08 MED ORDER — 0.9 % SODIUM CHLORIDE (POUR BTL) OPTIME
TOPICAL | Status: DC | PRN
  Administered 2022-08-08: 1000 mL

## 2022-08-08 MED ORDER — CEFAZOLIN SODIUM-DEXTROSE 2-4 GM/100ML-% IV SOLN
INTRAVENOUS | Status: AC
  Filled 2022-08-08: qty 100

## 2022-08-08 MED ORDER — BUPIVACAINE HCL (PF) 0.5 % IJ SOLN
INTRAMUSCULAR | Status: AC
  Filled 2022-08-08: qty 30

## 2022-08-08 MED ORDER — CELECOXIB 200 MG PO CAPS
ORAL_CAPSULE | ORAL | Status: AC
  Administered 2022-08-08: 200 mg via ORAL
  Filled 2022-08-08: qty 1

## 2022-08-08 MED ORDER — LIDOCAINE 2% (20 MG/ML) 5 ML SYRINGE
INTRAMUSCULAR | Status: DC | PRN
  Administered 2022-08-08: 40 mg via INTRAVENOUS
  Administered 2022-08-08: 60 mg via INTRAVENOUS

## 2022-08-08 MED ORDER — LIDOCAINE 2% (20 MG/ML) 5 ML SYRINGE
INTRAMUSCULAR | Status: AC
  Filled 2022-08-08: qty 5

## 2022-08-08 MED ORDER — LACTATED RINGERS IV SOLN: INTRAVENOUS | Status: DC | PRN

## 2022-08-08 MED ORDER — ACETAMINOPHEN 500 MG PO TABS
ORAL_TABLET | ORAL | Status: AC
  Administered 2022-08-08: 1000 mg via ORAL
  Filled 2022-08-08: qty 2

## 2022-08-08 MED ORDER — BUPIVACAINE HCL (PF) 0.5 % IJ SOLN
INTRAMUSCULAR | Status: DC | PRN
  Administered 2022-08-08: 30 mL

## 2022-08-08 MED ORDER — HYDROMORPHONE HCL 1 MG/ML IJ SOLN
INTRAMUSCULAR | Status: AC
  Filled 2022-08-08: qty 0.5

## 2022-08-08 MED ORDER — DEXAMETHASONE SODIUM PHOSPHATE 10 MG/ML IJ SOLN
INTRAMUSCULAR | Status: DC | PRN
  Administered 2022-08-08: 10 mg via INTRAVENOUS

## 2022-08-08 MED ORDER — ONDANSETRON HCL 4 MG/2ML IJ SOLN
INTRAMUSCULAR | Status: DC | PRN
  Administered 2022-08-08: 4 mg via INTRAVENOUS

## 2022-08-08 MED ORDER — FENTANYL CITRATE (PF) 250 MCG/5ML IJ SOLN
INTRAMUSCULAR | Status: DC | PRN
  Administered 2022-08-08: 100 ug via INTRAVENOUS
  Administered 2022-08-08 (×3): 50 ug via INTRAVENOUS

## 2022-08-08 MED ORDER — LIDOCAINE-EPINEPHRINE 0.5 %-1:200000 IJ SOLN
INTRAMUSCULAR | Status: DC | PRN
  Administered 2022-08-08: 10 mL

## 2022-08-08 MED ORDER — DEXAMETHASONE SODIUM PHOSPHATE 10 MG/ML IJ SOLN
INTRAMUSCULAR | Status: AC
  Filled 2022-08-08: qty 1

## 2022-08-08 MED ORDER — EPHEDRINE 5 MG/ML INJ
INTRAVENOUS | Status: AC
  Filled 2022-08-08: qty 5

## 2022-08-08 MED ORDER — ACETAMINOPHEN 500 MG PO TABS: 1000.0000 mg | ORAL_TABLET | Freq: Once | ORAL | Status: AC

## 2022-08-08 MED ORDER — PHENYLEPHRINE 80 MCG/ML (10ML) SYRINGE FOR IV PUSH (FOR BLOOD PRESSURE SUPPORT)
PREFILLED_SYRINGE | INTRAVENOUS | Status: DC | PRN
  Administered 2022-08-08: 80 ug via INTRAVENOUS
  Administered 2022-08-08 (×2): 160 ug via INTRAVENOUS

## 2022-08-08 MED ORDER — MIDAZOLAM HCL 2 MG/2ML IJ SOLN
INTRAMUSCULAR | Status: DC | PRN
  Administered 2022-08-08: 1 mg via INTRAVENOUS

## 2022-08-08 MED ORDER — SUGAMMADEX SODIUM 200 MG/2ML IV SOLN
INTRAVENOUS | Status: DC | PRN
  Administered 2022-08-08 (×2): 100 mg via INTRAVENOUS

## 2022-08-08 MED ORDER — PROPOFOL 10 MG/ML IV BOLUS
INTRAVENOUS | Status: AC
  Filled 2022-08-08: qty 20

## 2022-08-08 MED ORDER — ONDANSETRON HCL 4 MG/2ML IJ SOLN
INTRAMUSCULAR | Status: AC
  Filled 2022-08-08: qty 2

## 2022-08-08 SURGICAL SUPPLY — 66 items
ADH SKN CLS APL DERMABOND .7 (GAUZE/BANDAGES/DRESSINGS) ×1
ADH SKN CLS LQ APL DERMABOND (GAUZE/BANDAGES/DRESSINGS) ×1
APL SKNCLS STERI-STRIP NONHPOA (GAUZE/BANDAGES/DRESSINGS)
BAG COUNTER SPONGE SURGICOUNT (BAG) ×1 IMPLANT
BAG SPNG CNTER NS LX DISP (BAG) ×2
BASKET BONE COLLECTION (BASKET) ×1 IMPLANT
BENZOIN TINCTURE PRP APPL 2/3 (GAUZE/BANDAGES/DRESSINGS) IMPLANT
BLADE BONE MILL MEDIUM (MISCELLANEOUS) ×1 IMPLANT
BLADE CLIPPER SURG (BLADE) IMPLANT
BUR MATCHSTICK NEURO 3.0 LAGG (BURR) ×1 IMPLANT
BUR PRECISION FLUTE 5.0 (BURR) ×1 IMPLANT
CANISTER SUCT 3000ML PPV (MISCELLANEOUS) ×1 IMPLANT
CATH COUDE FOLEY 5CC 14FR (CATHETERS) IMPLANT
CNTNR URN SCR LID CUP LEK RST (MISCELLANEOUS) ×1 IMPLANT
CONT SPEC 4OZ STRL OR WHT (MISCELLANEOUS) ×1
COVER BACK TABLE 60X90IN (DRAPES) ×1 IMPLANT
DERMABOND ADVANCED .7 DNX12 (GAUZE/BANDAGES/DRESSINGS) ×1 IMPLANT
DERMABOND ADVANCED .7 DNX6 (GAUZE/BANDAGES/DRESSINGS) IMPLANT
DRAPE C-ARM 42X72 X-RAY (DRAPES) ×2 IMPLANT
DRAPE C-ARMOR (DRAPES) IMPLANT
DRAPE LAPAROTOMY 100X72X124 (DRAPES) ×1 IMPLANT
DRAPE SURG 17X23 STRL (DRAPES) ×1 IMPLANT
DRSG OPSITE POSTOP 4X6 (GAUZE/BANDAGES/DRESSINGS) IMPLANT
DURAPREP 26ML APPLICATOR (WOUND CARE) ×1 IMPLANT
ELECT REM PT RETURN 9FT ADLT (ELECTROSURGICAL) ×1
ELECTRODE REM PT RTRN 9FT ADLT (ELECTROSURGICAL) ×1 IMPLANT
GAUZE 4X4 16PLY ~~LOC~~+RFID DBL (SPONGE) IMPLANT
GAUZE SPONGE 4X4 12PLY STRL (GAUZE/BANDAGES/DRESSINGS) IMPLANT
GLOVE BIO SURGEON STRL SZ 6.5 (GLOVE) IMPLANT
GLOVE BIO SURGEON STRL SZ7 (GLOVE) IMPLANT
GLOVE EXAM NITRILE XL STR (GLOVE) IMPLANT
GLOVE SURG LTX SZ6.5 (GLOVE) ×2 IMPLANT
GOWN STRL REUS W/ TWL LRG LVL3 (GOWN DISPOSABLE) ×2 IMPLANT
GOWN STRL REUS W/ TWL XL LVL3 (GOWN DISPOSABLE) IMPLANT
GOWN STRL REUS W/TWL 2XL LVL3 (GOWN DISPOSABLE) IMPLANT
GOWN STRL REUS W/TWL LRG LVL3 (GOWN DISPOSABLE) ×3
GOWN STRL REUS W/TWL XL LVL3 (GOWN DISPOSABLE)
KIT BASIN OR (CUSTOM PROCEDURE TRAY) ×1 IMPLANT
KIT POSITION SURG JACKSON T1 (MISCELLANEOUS) ×1 IMPLANT
KIT TURNOVER KIT B (KITS) ×1 IMPLANT
MILL BONE PREP (MISCELLANEOUS) IMPLANT
NDL HYPO 25X1 1.5 SAFETY (NEEDLE) ×1 IMPLANT
NDL SPNL 18GX3.5 QUINCKE PK (NEEDLE) IMPLANT
NEEDLE HYPO 25X1 1.5 SAFETY (NEEDLE) ×1 IMPLANT
NEEDLE SPNL 18GX3.5 QUINCKE PK (NEEDLE) IMPLANT
NS IRRIG 1000ML POUR BTL (IV SOLUTION) ×1 IMPLANT
PACK LAMINECTOMY NEURO (CUSTOM PROCEDURE TRAY) ×1 IMPLANT
PAD ARMBOARD 7.5X6 YLW CONV (MISCELLANEOUS) ×2 IMPLANT
PLATE SPINOUS PROCESS 45MM (Plate) IMPLANT
SCREW BREAKOFF M4 (Screw) IMPLANT
SOL ELECTROSURG ANTI STICK (MISCELLANEOUS) ×1
SOLUTION ELECTROSURG ANTI STCK (MISCELLANEOUS) ×1 IMPLANT
SPACER OLIF DOME 9X26X9.5 10D (Spacer) IMPLANT
SPIKE FLUID TRANSFER (MISCELLANEOUS) ×1 IMPLANT
SPONGE SURGIFOAM ABS GEL 100 (HEMOSTASIS) ×1 IMPLANT
SPONGE T-LAP 4X18 ~~LOC~~+RFID (SPONGE) IMPLANT
STRIP CLOSURE SKIN 1/2X4 (GAUZE/BANDAGES/DRESSINGS) IMPLANT
SUT PROLENE 6 0 BV (SUTURE) IMPLANT
SUT VIC AB 0 CT1 18XCR BRD8 (SUTURE) ×1 IMPLANT
SUT VIC AB 0 CT1 8-18 (SUTURE) ×2
SUT VIC AB 2-0 CT1 18 (SUTURE) ×1 IMPLANT
SUT VIC AB 3-0 SH 8-18 (SUTURE) ×1 IMPLANT
TOWEL GREEN STERILE (TOWEL DISPOSABLE) ×1 IMPLANT
TOWEL GREEN STERILE FF (TOWEL DISPOSABLE) ×1 IMPLANT
TRAY FOLEY MTR SLVR 16FR STAT (SET/KITS/TRAYS/PACK) ×1 IMPLANT
WATER STERILE IRR 1000ML POUR (IV SOLUTION) ×1 IMPLANT

## 2022-08-08 NOTE — Anesthesia Preprocedure Evaluation (Signed)
Anesthesia Evaluation  Patient identified by MRN, date of birth, ID band Patient awake    Reviewed: Allergy & Precautions, NPO status , Patient's Chart, lab work & pertinent test results  Airway Mallampati: II  TM Distance: >3 FB Neck ROM: Full    Dental no notable dental hx.    Pulmonary shortness of breath, former smoker, PE   Pulmonary exam normal        Cardiovascular negative cardio ROS + dysrhythmias  Rhythm:Regular Rate:Normal     Neuro/Psych    Depression    negative neurological ROS     GI/Hepatic Neg liver ROS,GERD  Medicated,,  Endo/Other  Hypothyroidism    Renal/GU negative Renal ROS  negative genitourinary   Musculoskeletal  (+) Arthritis , Osteoarthritis,    Abdominal Normal abdominal exam  (+)   Peds  Hematology  (+) Blood dyscrasia, anemia   Anesthesia Other Findings   Reproductive/Obstetrics                             Anesthesia Physical Anesthesia Plan  ASA: 3  Anesthesia Plan: General   Post-op Pain Management: Celebrex PO (pre-op)* and Tylenol PO (pre-op)*   Induction: Intravenous  PONV Risk Score and Plan: 2 and Ondansetron, Dexamethasone and Treatment may vary due to age or medical condition  Airway Management Planned: Mask and Oral ETT  Additional Equipment: None  Intra-op Plan:   Post-operative Plan: Extubation in OR  Informed Consent: I have reviewed the patients History and Physical, chart, labs and discussed the procedure including the risks, benefits and alternatives for the proposed anesthesia with the patient or authorized representative who has indicated his/her understanding and acceptance.     Dental advisory given  Plan Discussed with: CRNA  Anesthesia Plan Comments:        Anesthesia Quick Evaluation

## 2022-08-08 NOTE — Progress Notes (Signed)
BP (!) 132/93   Pulse 63   Temp 98.1 F (36.7 C)   Resp 17   Ht 5' 11.5" (1.816 m)   Wt 88.9 kg   SpO2 95%   BMI 26.96 kg/m  Alert and oriented x 4 Moving all extremities well Speech is clear and fluent OR today for lumbar fusion

## 2022-08-08 NOTE — Progress Notes (Signed)
ANTICOAGULATION CONSULT NOTE  Pharmacy Consult for heparin Indication:  hx PE  Allergies  Allergen Reactions   Blood-Group Specific Substance     Must be Leukocyte Reduced and Irradiated due to stem cell transplant.    Prednisone Other (See Comments)    Agitation  Anger    Patient Measurements: Height: 5\' 11"  (180.3 cm) Weight: 90.5 kg (199 lb 8.3 oz) IBW/kg (Calculated) : 75.3  Vital Signs: Temp: 98.5 F (36.9 C) (06/05 0802) Temp Source: Oral (06/05 0802) BP: 132/83 (06/05 0802) Pulse Rate: 69 (06/05 0802)  Labs: Recent Labs    08/07/22 1447 08/08/22 0718  HGB 13.6 14.3  HCT 41.4 42.9  PLT 217 228  APTT 27  --   LABPROT 14.6 14.2  INR 1.1 1.1  HEPARINUNFRC  --  0.17*  CREATININE 1.20  --     Estimated Creatinine Clearance: 57.5 mL/min (by C-G formula based on SCr of 1.2 mg/dL).   Medical History: Past Medical History:  Diagnosis Date   Anemia 02-16-11   01-05-11- post surgery-Transfusions x 4 units   Arthritis    Bone pain 02/18/2013   Cellulitis    Clotting disorder (HCC)    Complication of anesthesia 02-16-11   Pt. speaks of awakening during the surgery from back  surgery   Degenerative disc disease 02-16-11   01-05-11 L3 fusion done   Esophageal reflux 05/31/2008   Qualifier: Diagnosis of  By: Lovell Sheehan MD, Balinda Quails    Hernia 02-16-11   left inguinal hernia at present   Hypothyroidism 02/01/2014   Multiple myeloma 02-16-11   suspected tumor  left sacral   Pancreatitis    Personal history of traumatic fracture 07/10/2012   Prostate cancer [facility])    Pulmonary embolism (HCC)    Second degree atrioventricular block 11/26/2012   Shortness of breath 02-16-11   hx. Pulmonary emboli x2 (9 yrs/ 3'12 -last)    Assessment: 80 yoM admitted for spinal surgery 6/5. Pt on warfarin PTA for hx PE. Pharmacy consulted to dose heparin preop while holding warfarin, INR is 1.1.   Heparin level 0.17 is subtherapeutic on 1400 units/hr. CBC stable.  No issues with  infusion or bleeding per RN. Planing lumbar decompression 6/5.   Plan:  Increase heparin 1600 units/h F/u 8hr heparin level Monitor daily heparin level, CBC Monitor for signs/symptoms of bleeding    Alphia Moh, PharmD, BCPS, [facility] Clinical Pharmacist  Please check AMION for all [facility] Pharmacy phone numbers After 10:00 PM, call Main Pharmacy [phone]

## 2022-08-08 NOTE — Progress Notes (Signed)
4E RN Alycia Rossetti called for report, pt is currently on Heparin infusion.  Dr Franky Macho notified thru OR nurse. To stop Heparin infusion prior to bringing pt to pre-op.  Heparin infusion stopped at 1154 by Alycia Rossetti. Dr Curt Jews notified.

## 2022-08-08 NOTE — Op Note (Signed)
08/08/2022  8:54 PM  PATIENT:  Rodney [name]  82 y.o. male With adjacent segment disease at L4/5 causing stenosis and spondylolisthesis. PRE-OPERATIVE DIAGNOSIS:  Spondylolisthesis, Lumbar region L4/5 Neurogenic claudication due to spinal stenosis  POST-OPERATIVE DIAGNOSIS:  Spondylolisthesis, Lumbar region Neurogenic claudication due to spinal stenosis PROCEDURE:  Procedure(s): Lumbar Four-Five Posterior Lumbar Interbody Fusion, Dome X cages filled with autograft Laminectomy L4 beyond the needed exposure for a PLIF Spire plate placement L3-L5  SURGEON:  Surgeon(s): Coletta Memos, MD  ASSISTANTS:none  ANESTHESIA:   general  EBL:  Total I/O In: 1200 [I.V.:1200] Out: 375 [Urine:225; Blood:150]  BLOOD ADMINISTERED:none  CELL SAVER GIVEN:none  COUNT:per nursing  DRAINS: none   SPECIMEN:  No Specimen  DICTATION: [name] is a 82 y.o. male whom was taken to the operating room intubated, and placed under a general anesthetic without difficulty. A foley catheter was placed under sterile conditions. He was positioned prone on a Jackson table with all pressure points properly padded.  His lumbar region was prepped and draped in a sterile manner. I infiltrated 10 cc's 1/2%lidocaine/1:2000,000 strength epinephrine into the planned incision. I opened the skin with a 10 blade and took the incision down to the thoracolumbar fascia. I exposed the lamina of L4, and L5 in a subperiosteal fashion bilaterally. I confirmed my location with an intraoperative xray.  I placed self retaining retractors and started the decompression.  I decompressed the spinal canal via a near complete laminectomy of L4 using the drill and Kerrison punches. I removed the ligamentum flavum exposing the thecal sac. I decompressed the L4 roots in the lateral recess and foramina with the punches and the drill. I identified the disc spaces and continued with the plif. PLIF's were performed at L4/5 in the same  fashion. I opened the disc space with a 15 blade then used a variety of instruments to remove the disc and prepare the space for the arthrodesis. I used curettes, rongeurs, punches, shavers for the disc space, and rasps in the discetomy. I measured the disc space and placed Dome X titanium expandable cages packed with autograft into the disc space(s). I also packed autograft into the disc spaces anterior to and around the cages.   I placed a spire plate from Z6-X0. Final films were performed and the final construct appeared to be in good position.  I closed the wound in a layered fashion. I approximated the thoracolumbar fascia, subcutaneous, and subcuticular planes with vicryl sutures. I used dermabond and an occlusive bandage for a sterile dressing.     PLAN OF CARE: Admit to inpatient   PATIENT DISPOSITION:  PACU - hemodynamically stable.   Delay start of Pharmacological VTE agent (>24hrs) due to surgical blood loss or risk of bleeding:  yes

## 2022-08-08 NOTE — Transfer of Care (Signed)
Immediate Anesthesia Transfer of Care Note  Patient: Rodney [name]  Procedure(s) Performed: Lumbar Four-Five Posterior Lumbar Interbody Fusion  Patient Location: PACU  Anesthesia Type:General  Level of Consciousness: awake, alert , oriented, and drowsy  Airway & Oxygen Therapy: Patient Spontanous Breathing and Patient connected to nasal cannula oxygen  Post-op Assessment: Report given to RN and Post -op Vital signs reviewed and stable  Post vital signs: Reviewed and stable  Last Vitals:  Vitals Value Taken Time  BP 133/83 08/08/22 2100  Temp 36.6 C 08/08/22 2053  Pulse 63 08/08/22 2107  Resp 12 08/08/22 2107  SpO2 93 % 08/08/22 2107  Vitals shown include unvalidated device data.  Last Pain:  Vitals:   08/08/22 2053  TempSrc:   PainSc: 0-No pain         Complications: There were no known notable events for this encounter.

## 2022-08-08 NOTE — Anesthesia Procedure Notes (Addendum)
Procedure Name: Intubation Date/Time: 08/08/2022 4:49 PM  Performed by: Sharyn Dross, CRNAPre-anesthesia Checklist: Patient identified, Emergency Drugs available, Suction available and Patient being monitored Patient Re-evaluated:Patient Re-evaluated prior to induction Oxygen Delivery Method: Circle system utilized Preoxygenation: Pre-oxygenation with 100% oxygen Induction Type: IV induction Ventilation: Mask ventilation without difficulty Laryngoscope Size: Mac and 4 Grade View: Grade I Tube type: Oral Tube size: 7.5 mm Number of attempts: 1 Airway Equipment and Method: Stylet and Oral airway Placement Confirmation: ETT inserted through vocal cords under direct vision, positive ETCO2 and breath sounds checked- equal and bilateral Secured at: 22 cm Tube secured with: Tape Dental Injury: Teeth and Oropharynx as per pre-operative assessment

## 2022-08-09 DIAGNOSIS — M4316 Spondylolisthesis, lumbar region: Secondary | ICD-10-CM

## 2022-08-09 LAB — CBC
HCT: 38.9 % — ABNORMAL LOW (ref 39.0–52.0)
Hemoglobin: 13.3 g/dL (ref 13.0–17.0)
MCH: 32 pg (ref 26.0–34.0)
MCHC: 34.2 g/dL (ref 30.0–36.0)
MCV: 93.5 fL (ref 80.0–100.0)
Platelets: 209 10*3/uL (ref 150–400)
RBC: 4.16 MIL/uL — ABNORMAL LOW (ref 4.22–5.81)
RDW: 13.2 % (ref 11.5–15.5)
WBC: 9.7 10*3/uL (ref 4.0–10.5)
nRBC: 0 % (ref 0.0–0.2)

## 2022-08-09 LAB — CREATININE, SERUM
Creatinine, Ser: 1.06 mg/dL (ref 0.61–1.24)
GFR, Estimated: >60 mL/min (ref >60)

## 2022-08-09 MED ORDER — MENTHOL 3 MG MT LOZG: 1.0000 | LOZENGE | OROMUCOSAL | Status: DC | PRN

## 2022-08-09 MED ORDER — ADULT MULTIVITAMIN W/MINERALS CH
1.0000 | ORAL_TABLET | Freq: Every day | ORAL | Status: DC
  Administered 2022-08-09 – 2022-08-15 (×7): 1 via ORAL
  Filled 2022-08-09 (×7): qty 1

## 2022-08-09 MED ORDER — LEVOTHYROXINE SODIUM 112 MCG PO TABS
112.0000 ug | ORAL_TABLET | Freq: Every day | ORAL | Status: DC
  Administered 2022-08-09 – 2022-08-15 (×7): 112 ug via ORAL
  Filled 2022-08-09 (×7): qty 1

## 2022-08-09 MED ORDER — FESOTERODINE FUMARATE ER 4 MG PO TB24
4.0000 mg | ORAL_TABLET | Freq: Every day | ORAL | Status: DC
  Administered 2022-08-09 – 2022-08-15 (×7): 4 mg via ORAL
  Filled 2022-08-09 (×7): qty 1

## 2022-08-09 MED ORDER — FLUTICASONE PROPIONATE 50 MCG/ACT NA SUSP: 2.0000 | Freq: Every day | NASAL | Status: DC | PRN

## 2022-08-09 MED ORDER — POTASSIUM CHLORIDE CRYS ER 20 MEQ PO TBCR
20.0000 meq | EXTENDED_RELEASE_TABLET | Freq: Every day | ORAL | Status: DC
  Administered 2022-08-09 – 2022-08-14 (×6): 20 meq via ORAL
  Filled 2022-08-09 (×6): qty 1

## 2022-08-09 MED ORDER — HEPARIN SODIUM (PORCINE) 5000 UNIT/ML IJ SOLN
5000.0000 [IU] | Freq: Three times a day (TID) | INTRAMUSCULAR | Status: AC
  Administered 2022-08-09 – 2022-08-10 (×5): 5000 [IU] via SUBCUTANEOUS
  Filled 2022-08-09 (×5): qty 1

## 2022-08-09 MED ORDER — CITALOPRAM HYDROBROMIDE 10 MG PO TABS
10.0000 mg | ORAL_TABLET | Freq: Every day | ORAL | Status: DC
  Administered 2022-08-09 – 2022-08-14 (×6): 10 mg via ORAL
  Filled 2022-08-09 (×6): qty 1

## 2022-08-09 MED ORDER — SODIUM CHLORIDE 0.9% FLUSH
3.0000 mL | Freq: Two times a day (BID) | INTRAVENOUS | Status: DC
  Administered 2022-08-09 – 2022-08-10 (×4): 3 mL via INTRAVENOUS

## 2022-08-09 MED ORDER — DIAZEPAM 5 MG PO TABS
5.0000 mg | ORAL_TABLET | Freq: Four times a day (QID) | ORAL | Status: DC | PRN
  Administered 2022-08-09: 5 mg via ORAL
  Filled 2022-08-09: qty 1

## 2022-08-09 MED ORDER — SODIUM CHLORIDE 0.9% FLUSH: 3.0000 mL | INTRAVENOUS | Status: DC | PRN

## 2022-08-09 MED ORDER — PHENOL 1.4 % MT LIQD: 1.0000 | OROMUCOSAL | Status: DC | PRN

## 2022-08-09 MED ORDER — GABAPENTIN 300 MG PO CAPS
600.0000 mg | ORAL_CAPSULE | Freq: Every day | ORAL | Status: DC
  Administered 2022-08-09 – 2022-08-14 (×6): 600 mg via ORAL
  Filled 2022-08-09 (×6): qty 2

## 2022-08-09 MED ORDER — CELECOXIB 200 MG PO CAPS
200.0000 mg | ORAL_CAPSULE | Freq: Two times a day (BID) | ORAL | Status: DC
  Administered 2022-08-09 – 2022-08-15 (×13): 200 mg via ORAL
  Filled 2022-08-09 (×13): qty 1

## 2022-08-09 MED ORDER — BIOTIN 5 MG PO CAPS: 5.0000 mg | ORAL_CAPSULE | Freq: Every day | ORAL | Status: DC

## 2022-08-09 MED ORDER — SENNA 8.6 MG PO TABS
1.0000 | ORAL_TABLET | Freq: Two times a day (BID) | ORAL | Status: DC
  Administered 2022-08-09 – 2022-08-15 (×10): 8.6 mg via ORAL
  Filled 2022-08-09 (×12): qty 1

## 2022-08-09 MED ORDER — NITROGLYCERIN 0.1 MG/HR TD PT24
0.1000 mg | MEDICATED_PATCH | Freq: Every day | TRANSDERMAL | Status: DC
  Administered 2022-08-09 – 2022-08-15 (×7): 0.1 mg via TRANSDERMAL
  Filled 2022-08-09 (×7): qty 1

## 2022-08-09 MED ORDER — LORATADINE 10 MG PO TABS
10.0000 mg | ORAL_TABLET | Freq: Every day | ORAL | Status: DC
  Administered 2022-08-09 – 2022-08-15 (×7): 10 mg via ORAL
  Filled 2022-08-09 (×7): qty 1

## 2022-08-09 MED ORDER — MIRABEGRON ER 25 MG PO TB24
25.0000 mg | ORAL_TABLET | Freq: Every day | ORAL | Status: DC
  Administered 2022-08-09 – 2022-08-15 (×7): 25 mg via ORAL
  Filled 2022-08-09 (×7): qty 1

## 2022-08-09 MED ORDER — PANTOPRAZOLE SODIUM 40 MG PO TBEC
40.0000 mg | DELAYED_RELEASE_TABLET | Freq: Every day | ORAL | Status: DC
  Administered 2022-08-09 – 2022-08-15 (×7): 40 mg via ORAL
  Filled 2022-08-09 (×8): qty 1

## 2022-08-09 MED ORDER — SODIUM CHLORIDE 0.9 % IV SOLN: 250.0000 mL | INTRAVENOUS | Status: DC

## 2022-08-09 MED ORDER — POTASSIUM CHLORIDE IN NACL 20-0.9 MEQ/L-% IV SOLN
INTRAVENOUS | Status: DC
  Filled 2022-08-09 (×4): qty 1000

## 2022-08-09 MED ORDER — POLYVINYL ALCOHOL 1.4 % OP SOLN: 1.0000 [drp] | Freq: Every day | OPHTHALMIC | Status: DC | PRN

## 2022-08-09 MED ORDER — VITAMIN B-12 1000 MCG PO TABS
3000.0000 ug | ORAL_TABLET | Freq: Every day | ORAL | Status: DC
  Administered 2022-08-09 – 2022-08-15 (×7): 3000 ug via ORAL
  Filled 2022-08-09 (×8): qty 3

## 2022-08-09 MED ORDER — OXYCODONE HCL ER 10 MG PO T12A
10.0000 mg | EXTENDED_RELEASE_TABLET | Freq: Two times a day (BID) | ORAL | Status: DC
  Administered 2022-08-09 – 2022-08-15 (×13): 10 mg via ORAL
  Filled 2022-08-09 (×13): qty 1

## 2022-08-09 MED ORDER — OXYCODONE HCL 5 MG PO TABS
10.0000 mg | ORAL_TABLET | ORAL | Status: DC | PRN
  Administered 2022-08-09 – 2022-08-15 (×4): 10 mg via ORAL
  Filled 2022-08-09 (×5): qty 2

## 2022-08-09 MED ORDER — VITAMIN D 25 MCG (1000 UNIT) PO TABS
1000.0000 [IU] | ORAL_TABLET | Freq: Two times a day (BID) | ORAL | Status: DC
  Administered 2022-08-09 – 2022-08-15 (×13): 1000 [IU] via ORAL
  Filled 2022-08-09 (×13): qty 1

## 2022-08-09 NOTE — Evaluation (Signed)
Physical Therapy Evaluation Patient Details Name: Rodney [name] MRN: [id number] DOB: 1940/05/25 Today's Date: 08/09/2022  History of Present Illness  Pt is a 82 y.o. male who presented 08/07/22 for heparin treatment pre and post op. S/p L4-5 PLIF, laminectomy L4, spire plate placement L3-5 on 6/5. PMH: former smoker (1970), multiple myeloma, s/p chemoradiation, autologous stem cell transplant 11/16/11, pancreatitis, 2nd degree AV block, prostate CA, spinal surgeries, cholecystectomy 2012, anemia, clotting disorder, hypothyroidism, PE   Clinical Impression  Pt presents with condition above and deficits mentioned below, see PT Problem List. PTA, he was independent without DME, living with his wife in a 1-level house with 2 STE. Per pt, his wife is a CNA and has experience assisting him with functional mobility after prior medical complications. Pt reports baseline bil feel distal plantar surface sensation deficits and R lower extremity weakness from his back issues. This was noted during evaluation today. He displays deficits in gross strength, balance, and activity tolerance and needs cues to maintain his spinal precautions. Pt prefers to not utilize an AD due to bil wrist issues, thus performed all functional mobility with no AD but intermittent HHA for stability. Pt is currently requiring minA for bed mobility, transfers, and ambulation bouts. He is at risk for falls. Recommending follow-up with HHPT once pt discharges home with family support. Will continue to follow acutely.     Recommendations for follow up therapy are one component of a multi-disciplinary discharge planning process, led by the attending physician.  Recommendations may be updated based on patient status, additional functional criteria and insurance authorization.  Follow Up Recommendations       Assistance Recommended at Discharge Intermittent Supervision/Assistance  Patient can return home with the following  A little help  with walking and/or transfers;A little help with bathing/dressing/bathroom;Assistance with cooking/housework;Help with stairs or ramp for entrance;Assist for transportation    Equipment Recommendations None recommended by PT  Recommendations for Other Services       Functional Status Assessment Patient has had a recent decline in their functional status and demonstrates the ability to make significant improvements in function in a reasonable and predictable amount of time.     Precautions / Restrictions Precautions Precautions: Fall;Back Precaution Booklet Issued: No Precaution Comments: reviewed precautions Required Braces or Orthoses:  (no brace needed orders) Restrictions Weight Bearing Restrictions: No      Mobility  Bed Mobility Overal bed mobility: Needs Assistance Bed Mobility: Rolling, Sidelying to Sit Rolling: Min assist, Min guard Sidelying to sit: Min assist       General bed mobility comments: Bed flat and pt cued to use bed rail to log roll and transition to sit EOB. Min guard to roll to R, minA to rotate trunk to roll to L. MinA to bring legs off EOB and ascend trunk with cued.    Transfers Overall transfer level: Needs assistance Equipment used: 1 person hand held assist Transfers: Sit to/from Stand Sit to Stand: Min assist           General transfer comment: Pt requesting R HHA to pull up to stand from EOB, scooting himself anteriorly on EOB and placing feet appropriately for set-up with transfers without needing cues.    Ambulation/Gait Ambulation/Gait assistance: Min assist, Min guard Gait Distance (Feet): 145 Feet Assistive device: None, 1 person hand held assist Gait Pattern/deviations: Step-through pattern, Decreased stride length, Decreased dorsiflexion - right, Trunk flexed Gait velocity: reduced Gait velocity interpretation: <1.8 ft/sec, indicate of risk for recurrent falls  General Gait Details: Pt ambulates with an anterior lean and  flexed posture, which increases as distance progresses and pain and fatigue increase. Needs cues to pause to rest and improve posture to maintain precautions. Min guard-minA for balance with occasional UE support on furniture when in the room. Decreased R foot clearance noted and R leg externally rotated.  Stairs            Wheelchair Mobility    Modified Rankin (Stroke Patients Only)       Balance Overall balance assessment: Needs assistance Sitting-balance support: No upper extremity supported, Feet supported Sitting balance-Leahy Scale: Fair     Standing balance support: No upper extremity supported, Single extremity supported, During functional activity Standing balance-Leahy Scale: Fair Standing balance comment: Able to ambulate without UE support, intermittent minA                             Pertinent Vitals/Pain Pain Assessment Pain Assessment: Faces Faces Pain Scale: Hurts little more Pain Location: back Pain Descriptors / Indicators: Discomfort, Grimacing, Operative site guarding Pain Intervention(s): Limited activity within patient's tolerance, Monitored during session, Premedicated before session, Repositioned    Home Living Family/patient expects to be discharged to:: Private residence Living Arrangements: Spouse/significant other Available Help at Discharge: Family;Available 24 hours/day Type of Home: House Home Access: Stairs to enter Entrance Stairs-Rails: None Entrance Stairs-Number of Steps: 2   Home Layout: One level Home Equipment: Agricultural consultant (2 wheels);Shower seat - built in      Prior Function Prior Level of Function : Independent/Modified Independent;Driving             Mobility Comments: no AD, no recent falls ADLs Comments: 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     Hand Dominance        Extremity/Trunk Assessment   Upper Extremity Assessment Upper Extremity Assessment: Defer  to OT evaluation    Lower Extremity Assessment Lower Extremity Assessment: RLE deficits/detail;Generalized weakness (for L leg - MMT scores of 4+ knee extension and ankle dorsiflexion though; mildly decreased sensation at plantar distal surface (more on R than L)) RLE Deficits / Details: functional weakness noted with decreased foot clearance and external rotation of leg, reportedly due to chronic issues from back; MMT scores of 4+ knee extension and ankle dorsiflexion though; mildly decreased sensation at plantar distal surface (more on R than L) RLE Sensation: decreased light touch RLE Coordination: decreased gross motor    Cervical / Trunk Assessment Cervical / Trunk Assessment: Back Surgery;Kyphotic  Communication   Communication: No difficulties  Cognition Arousal/Alertness: Awake/alert Behavior During Therapy: WFL for tasks assessed/performed Overall Cognitive Status: Within Functional Limits for tasks assessed                                 General Comments: Tangential speech at times        General Comments General comments (skin integrity, edema, etc.): encouraged frequent mobility    Exercises     Assessment/Plan    PT Assessment Patient needs continued PT services  PT Problem List Decreased strength;Decreased activity tolerance;Decreased balance;Decreased mobility;Decreased knowledge of precautions;Impaired sensation;Pain       PT Treatment Interventions DME instruction;Gait training;Stair training;Functional mobility training;Therapeutic activities;Therapeutic exercise;Balance training;Neuromuscular re-education;Patient/family education    PT Goals (Current goals can be found in the Care Plan section)  Acute Rehab PT Goals Patient Stated  Goal: to reduce pain PT Goal Formulation: With patient Time For Goal Achievement: 08/23/22 Potential to Achieve Goals: Good    Frequency Min 5X/week     Co-evaluation               AM-PAC PT "6  Clicks" Mobility  Outcome Measure Help needed turning from your back to your side while in a flat bed without using bedrails?: A Little Help needed moving from lying on your back to sitting on the side of a flat bed without using bedrails?: A Little Help needed moving to and from a bed to a chair (including a wheelchair)?: A Little Help needed standing up from a chair using your arms (e.g., wheelchair or bedside chair)?: A Little Help needed to walk in hospital room?: A Little Help needed climbing 3-5 steps with a railing? : A Little 6 Click Score: 18    End of Session Equipment Utilized During Treatment: Gait belt Activity Tolerance: Patient tolerated treatment well Patient left: in chair;with call bell/phone within reach;with chair alarm set Nurse Communication: Mobility status PT Visit Diagnosis: Unsteadiness on feet (R26.81);Other abnormalities of gait and mobility (R26.89);Muscle weakness (generalized) (M62.81);Difficulty in walking, not elsewhere classified (R26.2);Pain Pain - Right/Left:  (back) Pain - part of body:  (back)    Time: 0917-1000 PT Time Calculation (min) (ACUTE ONLY): 43 min   Charges:   PT Evaluation $PT Eval Moderate Complexity: 1 Mod PT Treatments $Gait Training: 8-22 mins $Therapeutic Activity: 8-22 mins        Raymond Gurney, PT, DPT Acute Rehabilitation Services  Office: [phone]   Jewel Baize 08/09/2022, 10:11 AM

## 2022-08-09 NOTE — Progress Notes (Signed)
Patient ID: Rodney [name], male   DOB: 10-01-1940, 82 y.o.   MRN: [id number] BP 99/61 (BP Location: Left Arm)   Pulse 82   Temp 98 F (36.7 C) (Oral)   Resp 18   Ht 5' 11.5" (1.816 m)   Wt 88.9 kg   SpO2 99%   BMI 26.96 kg/m  Alert and oriented x 4, speech is clear and fluent Moving well Wound is clean, dry, no signs of infection Start heparin tomorrow 2100

## 2022-08-09 NOTE — TOC Initial Note (Signed)
Transition of Care [facility]) - Initial/Assessment Note    Patient Details  Name: Rodney [name] MRN: [id number] Date of Birth: 1940-12-07  Transition of Care [facility]) CM/SW Contact:    Kermit Balo, RN Phone Number: 08/09/2022, 12:34 PM  Clinical Narrative:                 Pt is from home with his spouse. She is able to provide supervision at home.  Spouse over sees his medications and he denies any issues. Pharmacy: [facility] in [place].  Spouse provides all needed transportation. Home health recommended at home. Pt states he just finished with home health therapies and he doesn't want to have HH therapy again at this time.  Bedside commode for home will be ordered through [employer] and delivered to the patients room.  ToC following.  Expected Discharge Plan: Home/Self Care Barriers to Discharge: Continued Medical Work up   Patient Goals and CMS Choice   CMS Medicare.gov Compare Post Acute Care list provided to:: Patient Choice offered to / list presented to : Patient      Expected Discharge Plan and Services   Discharge Planning Services: CM Consult   Living arrangements for the past 2 months: Single Family Home                 DME Arranged: Bedside commode DME Agency: [employer] Date DME Agency Contacted: 08/09/22   Representative spoke with at DME Agency: Marthann Schiller            Prior Living Arrangements/Services Living arrangements for the past 2 months: Single Family Home Lives with:: Spouse Patient language and need for interpreter reviewed:: Yes Do you feel safe going back to the place where you live?: Yes        Care giver support system in place?: Yes (comment) Current home services: DME (walker) Criminal Activity/Legal Involvement Pertinent to Current Situation/Hospitalization: No - Comment as needed  Activities of Daily Living Home Assistive Devices/Equipment: Walker (specify type) ADL Screening (condition at time of admission) Patient's cognitive  ability adequate to safely complete daily activities?: Yes Is the patient deaf or have difficulty hearing?: No Does the patient have difficulty seeing, even when wearing glasses/contacts?: No Does the patient have difficulty concentrating, remembering, or making decisions?: No Patient able to express need for assistance with ADLs?: Yes Does the patient have difficulty dressing or bathing?: Yes Independently performs ADLs?: No Communication: Independent Dressing (OT): Needs assistance Is this a change from baseline?: Change from baseline, expected to last >3 days Grooming: Independent Feeding: Independent Bathing: Independent Is this a change from baseline?: Pre-admission baseline Toileting: Independent In/Out Bed: Independent Walks in Home: Needs assistance Is this a change from baseline?: Change from baseline, expected to last >3 days Does the patient have difficulty walking or climbing stairs?: Yes Weakness of Legs: Both Weakness of Arms/Hands: Both  Permission Sought/Granted                  Emotional Assessment Appearance:: Appears stated age Attitude/Demeanor/Rapport: Engaged Affect (typically observed): Accepting Orientation: : Oriented to Self, Oriented to Place, Oriented to  Time, Oriented to Situation   Psych Involvement: No (comment)  Admission diagnosis:  Lumbar stenosis with neurogenic claudication [M48.062] Lumbar adjacent segment disease with spondylolisthesis [M51.36, M43.16] Patient Active Problem List   Diagnosis Date Noted   Lumbar adjacent segment disease with spondylolisthesis 08/09/2022   Lumbar stenosis with neurogenic claudication 08/07/2022   Synovial cyst of lumbar facet joint 01/16/2022   Greater trochanteric pain syndrome  of left lower extremity 11/21/2020   Neck pain 11/21/2020   Pain in both lower extremities 10/05/2020   Close exposure to COVID-19 virus 03/21/2020   Prostate cancer (HCC) 11/06/2019   Erectile dysfunction due to arterial  insufficiency 11/06/2019   Hyperlipidemia, unspecified 04/06/2019   Stem cells transplant status (HCC) 09/25/2018   Tear of left gluteus medius tendon 11/08/2017   Primary osteoarthritis of both hips 11/08/2017   Vitamin D deficiency 04/09/2017   OAB (overactive bladder) 04/09/2017   History of skin cancer 04/09/2017   Back pain, thoracic 12/19/2016   Long term (current) use of anticoagulants 12/14/2016   Recurrent major depression in full remission (HCC) 10/02/2016   Fatigue 09/24/2016   Lump of skin 04/23/2016   Bilateral hip pain 04/06/2016   Carpal tunnel syndrome on both sides 01/24/2016   Right foot pain 10/05/2015   Tendonitis of ankle, left 10/03/2015   Hypothyroidism 02/01/2014   Allergic rhinitis 02/01/2014   Bone pain 02/18/2013   Multiple myeloma in remission (HCC) 07/10/2012   Low back pain 02/11/2012   Esophageal reflux 05/31/2008   History of duodenal ulcer 01/05/2008   PROSTATE CANCER, HX OF 01/05/2008   PCP:  [facility]:   [id number] - [place], [place] - [address] [facility] [address] [place] [address] Phone: [phone] Fax: [phone]     Social Determinants of Health (SDOH) Social History: SDOH Screenings   Food Insecurity: No Food Insecurity (08/07/2022)  Housing: Low Risk  (08/07/2022)  Transportation Needs: No Transportation Needs (08/07/2022)  Utilities: Not At Risk (08/07/2022)  Depression (PHQ2-9): Low Risk  (09/26/2020)  Tobacco Use: Medium Risk (08/08/2022)   SDOH Interventions:     Readmission Risk Interventions     No data to display

## 2022-08-09 NOTE — Plan of Care (Signed)

## 2022-08-09 NOTE — Evaluation (Signed)
Occupational Therapy Evaluation Patient Details Name: Rodney [name] MRN: [id number] DOB: July 02, 1940 Today's Date: 08/09/2022   History of Present Illness Pt is a 82 y.o. male who presented 08/07/22 for heparin treatment pre and post op. S/p L4-5 PLIF, laminectomy L4, spire plate placement L3-5 on 6/5. PMH: former smoker (1970), multiple myeloma, s/p chemoradiation, autologous stem cell transplant 11/16/11, pancreatitis, 2nd degree AV block, prostate CA, spinal surgeries, cholecystectomy 2012, anemia, clotting disorder, hypothyroidism, PE   Clinical Impression   Pt currently needs min assist to min guard assist for transfers and simulated toileting tasks.  Mod assist for LB selfcare without use of AE for following back precautions.  Lives with his spouse who he reports can assist with bathing and dressing as needed.  Feel he will benefit from acute care OT to progress to supervision/ modified independent level for return home.  Recommend HHOT eval for safety.       Recommendations for follow up therapy are one component of a multi-disciplinary discharge planning process, led by the attending physician.  Recommendations may be updated based on patient status, additional functional criteria and insurance authorization.   Assistance Recommended at Discharge PRN  Patient can return home with the following A little help with walking and/or transfers;A little help with bathing/dressing/bathroom;Help with stairs or ramp for entrance;Assistance with cooking/housework    Functional Status Assessment  Patient has had a recent decline in their functional status and demonstrates the ability to make significant improvements in function in a reasonable and predictable amount of time.  Equipment Recommendations  BSC/3in1       Precautions / Restrictions Precautions Precautions: Fall;Back Precaution Booklet Issued: Yes (comment) Precaution Comments: reviewed precautions Required Braces or Orthoses:  (no  brace needed orders) Restrictions Weight Bearing Restrictions: No      Mobility Bed Mobility Overal bed mobility: Needs Assistance Bed Mobility: Rolling, Sidelying to Sit Rolling: Min guard (increased time and use of rail) Sidelying to sit: Min assist       General bed mobility comments: Mod demonstrational cueing for sit to supine secondary to trying to lay down on his back instead of his side.    Transfers Overall transfer level: Needs assistance Equipment used: None Transfers: Sit to/from Stand Sit to Stand: Min assist           General transfer comment: Min guard from elevated bed with hand placement on each side of him.      Balance Overall balance assessment: Needs assistance Sitting-balance support: No upper extremity supported, Feet supported Sitting balance-Leahy Scale: Fair Sitting balance - Comments: No LOB sitting EOB with feet supported   Standing balance support: No upper extremity supported, Single extremity supported, During functional activity Standing balance-Leahy Scale: Fair Standing balance comment: Needs support with mobility for safety per observation.                           ADL either performed or assessed with clinical judgement   ADL Overall ADL's : Needs assistance/impaired Eating/Feeding: Independent;Sitting   Grooming: Wash/dry hands;Wash/dry face;Min guard;Standing Grooming Details (indicate cue type and reason): simulated Upper Body Bathing: Set up;Sitting Upper Body Bathing Details (indicate cue type and reason): simulated Lower Body Bathing: Sit to/from stand;Minimal assistance Lower Body Bathing Details (indicate cue type and reason): simulated Upper Body Dressing : Set up;Sitting Upper Body Dressing Details (indicate cue type and reason): simulated Lower Body Dressing: Moderate assistance;Sit to/from stand Lower Body Dressing Details (indicate cue type and  reason): simulated Toilet Transfer: Ambulation;Minimal  assistance Toilet Transfer Details (indicate cue type and reason): no device to the lower toilet with grab bar Toileting- Clothing Manipulation and Hygiene: Minimal assistance;Sit to/from stand       Functional mobility during ADLs: Minimal assistance (ambulation to the bathroom) General ADL Comments: Pt with increased conversation throughout session, mod instructional cueing to focus and initiate tasks at hand.  Able to state 2/3 back precautions but did not recall lfiting precaution.  Mod demonstrational cueing for following them sit to supine as he attempted to lay down on his back instead of going down on his side and then rolling on his back.  Discussed benefit of 3:1 over toilet as his are low and he is in agreement.  Already has shower seat per his report in the walk-in shower.  He has seen AE before and has a Sports administrator but reports that his wife with help him with donning his socks and as needed.  Recommend HHOT eval for safety as pt exhibits balance issues and reports not being able to use assistive device secondary to hand/wrist pain with weightbearing.     Vision Baseline Vision/History: 1 Wears glasses Ability to See in Adequate Light: 0 Adequate Patient Visual Report: No change from baseline Vision Assessment?: No apparent visual deficits     Perception  Not tested   Praxis  Not tested    Pertinent Vitals/Pain Pain Assessment Pain Assessment: No/denies pain Faces Pain Scale: Hurts little more Pain Location: back Pain Descriptors / Indicators: Discomfort, Grimacing, Operative site guarding Pain Intervention(s): Repositioned, Patient requesting pain meds-RN notified     Hand Dominance Right   Extremity/Trunk Assessment Upper Extremity Assessment Upper Extremity Assessment: Generalized weakness;RUE deficits/detail RUE Deficits / Details: Pt with recent hx of right shoulder injury with deceased AROM shoulder flexion and adduction to assist with rolling.  Did not formally  assess in session secondary to back precautions.  AROM for all other joints of the RUE WFLs grossly. RUE Sensation: decreased light touch (history of bilateral carpal tunnel with numbness in the first three digits of both hands.)   Lower Extremity Assessment Lower Extremity Assessment: Defer to PT evaluation RLE Deficits / Details: functional weakness noted with decreased foot clearance and external rotation of leg, reportedly due to chronic issues from back; MMT scores of 4+ knee extension and ankle dorsiflexion though; mildly decreased sensation at plantar distal surface (more on R than L) RLE Sensation: decreased light touch RLE Coordination: decreased gross motor   Cervical / Trunk Assessment Cervical / Trunk Assessment: Back Surgery;Kyphotic   Communication Communication Communication: No difficulties   Cognition Arousal/Alertness: Awake/alert Behavior During Therapy: WFL for tasks assessed/performed Overall Cognitive Status: Within Functional Limits for tasks assessed                                 General Comments: Pt very talkative, difficulty to focus on topic and complete task when asked.  Likely baseline     General Comments  encouraged frequent mobility            Home Living Family/patient expects to be discharged to:: Private residence Living Arrangements: Spouse/significant other Available Help at Discharge: Family;Available 24 hours/day Type of Home: House Home Access: Stairs to enter [employer] of Steps: 2 Entrance Stairs-Rails: None Home Layout: One level     Bathroom Shower/Tub: Tub/shower unit;Walk-in shower (one of each)   Bathroom Toilet: Standard  Home Equipment: Agricultural consultant (2 wheels);Shower seat - built in          Prior Functioning/Environment Prior Level of Function : Independent/Modified Independent;Driving             Mobility Comments: no AD, no recent falls ADLs Comments: 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 OT Goal Formulation: With patient Time For Goal Achievement: 08/23/22 Potential to Achieve Goals: Good  OT Frequency: Min 2X/week       AM-PAC OT "6 Clicks" Daily Activity     Outcome Measure Help from another person eating meals?: None Help from another person taking care of personal grooming?: A Little Help from another person toileting, which includes using toliet, bedpan, or urinal?: A Little Help from another person bathing (including washing, rinsing, drying)?: A Lot Help from another person to put on and taking off regular upper body clothing?: A Little Help from another person to put on and taking off regular lower body clothing?: A Lot 6 Click Score: 17   End of Session Nurse Communication: Mobility status  Activity Tolerance: Patient tolerated treatment well Patient left: in bed;with call bell/phone within reach;with bed alarm set  OT Visit Diagnosis: Unsteadiness on feet (R26.81);Muscle weakness (generalized) (M62.81);Pain Pain - Right/Left:  (back)                Time: 1610-9604 OT Time Calculation (min): 52 min Charges:  OT General Charges $OT Visit: 1 Visit OT Evaluation $OT Eval Moderate Complexity: 1 Mod OT Treatments $Self Care/Home Management : 23-37 mins  Perrin Maltese, OTR/L Acute Rehabilitation Services  Office [phone] 08/09/2022

## 2022-08-10 LAB — CBC
HCT: 35.3 % — ABNORMAL LOW (ref 39.0–52.0)
Hemoglobin: 11.5 g/dL — ABNORMAL LOW (ref 13.0–17.0)
MCH: 30.4 pg (ref 26.0–34.0)
MCHC: 32.6 g/dL (ref 30.0–36.0)
MCV: 93.4 fL (ref 80.0–100.0)
Platelets: 161 10*3/uL (ref 150–400)
RBC: 3.78 MIL/uL — ABNORMAL LOW (ref 4.22–5.81)
RDW: 13.7 % (ref 11.5–15.5)
WBC: 8.2 10*3/uL (ref 4.0–10.5)
nRBC: 0 % (ref 0.0–0.2)

## 2022-08-10 MED ORDER — HEPARIN (PORCINE) 25000 UT/250ML-% IV SOLN
1700.0000 [IU]/h | INTRAVENOUS | Status: DC
  Administered 2022-08-10 – 2022-08-12 (×4): 1600 [IU]/h via INTRAVENOUS
  Administered 2022-08-13: 1700 [IU]/h via INTRAVENOUS
  Filled 2022-08-10 (×6): qty 250

## 2022-08-10 NOTE — Care Management Important Message (Signed)
Important Message  Patient Details  Name: Rodney [name] MRN: [id number] Date of Birth: 1940-03-18   Medicare Important Message Given:  Yes     Dorena Bodo 08/10/2022, 3:46 PM

## 2022-08-10 NOTE — Progress Notes (Signed)
ANTICOAGULATION CONSULT NOTE  Pharmacy Consult for heparin Indication:  hx PE  Allergies  Allergen Reactions   Blood-Group Specific Substance     Must be Leukocyte Reduced and Irradiated due to stem cell transplant.    Prednisone Other (See Comments)    Agitation  Anger    Patient Measurements: Height: 5' 11.5" (181.6 cm) Weight: 88.9 kg (196 lb) IBW/kg (Calculated) : 76.45  Vital Signs: Temp: 98.1 F (36.7 C) (06/07 1008) Temp Source: Oral (06/07 1008) BP: 101/61 (06/07 1008) Pulse Rate: 65 (06/07 1008)  Labs: Recent Labs    08/07/22 1447 08/08/22 0718 08/09/22 0853 08/10/22 0800  HGB 13.6 14.3 13.3 11.5*  HCT 41.4 42.9 38.9* 35.3*  PLT 217 228 209 161  APTT 27  --   --   --   LABPROT 14.6 14.2  --   --   INR 1.1 1.1  --   --   HEPARINUNFRC  --  0.17*  --   --   CREATININE 1.20  --  1.06  --      Estimated Creatinine Clearance: 61.1 mL/min (by C-G formula based on SCr of 1.06 mg/dL).   Medical History: Past Medical History:  Diagnosis Date   Anemia 02-16-11   01-05-11- post surgery-Transfusions x 4 units   Arthritis    Bone pain 02/18/2013   Cellulitis    Clotting disorder (HCC)    Complication of anesthesia 02-16-11   Pt. speaks of awakening during the surgery from back  surgery   Degenerative disc disease 02-16-11   01-05-11 L3 fusion done   Esophageal reflux 05/31/2008   Qualifier: Diagnosis of  By: Lovell Sheehan MD, Balinda Quails    Hernia 02-16-11   left inguinal hernia at present   Hypothyroidism 02/01/2014   Multiple myeloma 02-16-11   suspected tumor  left sacral   Pancreatitis    Personal history of traumatic fracture 07/10/2012   Prostate cancer [facility])    Pulmonary embolism (HCC)    Second degree atrioventricular block 11/26/2012   Shortness of breath 02-16-11   hx. Pulmonary emboli x2 (9 yrs/ 3'12 -last)    Assessment: 85 yoM admitted for spinal surgery 6/5. Pt on warfarin PTA for hx PE. Pharmacy consulted to dose heparin preop while holding  warfarin, INR is 1.1.    Plan:  Stop heparin SQ after 1400 dose on 08/10/2022 Restart heparin 1600 units/h at 21:00 on 08/10/2022 F/u 8hr heparin level Monitor daily heparin level, CBC Monitor for signs/symptoms of bleeding    Greta Doom BS, PharmD, BCPS Clinical Pharmacist 08/10/2022 11:15 AM  Contact: [phone] after 3 PM  "Be curious, not judgmental..." -Debbora Dus

## 2022-08-10 NOTE — Progress Notes (Signed)
Physical Therapy Treatment Patient Details Name: Rodney [name] MRN: [id number] DOB: 1940/08/26 Today's Date: 08/10/2022   History of Present Illness Pt is a 82 y.o. male who presented 08/07/22 for heparin treatment pre and post op. S/p L4-5 PLIF, laminectomy L4, spire plate placement L3-5 on 6/5. PMH: former smoker (1970), multiple myeloma, s/p chemoradiation, autologous stem cell transplant 11/16/11, pancreatitis, 2nd degree AV block, prostate CA, spinal surgeries, cholecystectomy 2012, anemia, clotting disorder, hypothyroidism, PE    PT Comments    Pt is making good, gradual progress with functional mobility, only needing min guard assist for bed mobility, transfers, gait bouts without UE support, and navigating x2 stairs with x1 handrail support. He continues to display decreased R ankle dorsiflexion and foot clearance when ambulating, needing cues to correct. In addition, he displays deficits in balance and lower extremity strength that place him at risk for falls, thus performed standing exercises and provided pt with HEP handout. Pt reports he is declining desire for [facility] therapy and DME at this time. Educated pt on his spinal precautions and recs to mobilize and change positions frequently. He verbalized understanding but displays only fair compliance with maintaining his precautions. Will continue to follow acutely.     Recommendations for follow up therapy are one component of a multi-disciplinary discharge planning process, led by the attending physician.  Recommendations may be updated based on patient status, additional functional criteria and insurance authorization.  Follow Up Recommendations       Assistance Recommended at Discharge Intermittent Supervision/Assistance  Patient can return home with the following A little help with walking and/or transfers;A little help with bathing/dressing/bathroom;Assistance with cooking/housework;Help with stairs or ramp for entrance;Assist for  transportation   Equipment Recommendations  None recommended by PT (pt declining DME)    Recommendations for Other Services       Precautions / Restrictions Precautions Precautions: Fall;Back Precaution Booklet Issued: Yes (comment) Precaution Comments: reviewed precautions - needs cues for compliance intermittently Required Braces or Orthoses:  (no brace needed orders) Restrictions Weight Bearing Restrictions: No     Mobility  Bed Mobility Overal bed mobility: Needs Assistance Bed Mobility: Rolling, Sidelying to Sit Rolling: Min guard Sidelying to sit: Min guard       General bed mobility comments: Bed flat and pt cued to use L bed rail to log roll and transition to sit L EOB. Min guard for safety, extra time and effort to complete.    Transfers Overall transfer level: Needs assistance Equipment used: None Transfers: Sit to/from Stand Sit to Stand: Min guard           General transfer comment: Pt scooting himself anteriorly on EOB and placing feet appropriately for set-up with transfers without needing cues, extra time for set-up and power up to stand, min guard for safety    Ambulation/Gait Ambulation/Gait assistance: Min guard Gait Distance (Feet): 280 Feet Assistive device: None, IV Pole Gait Pattern/deviations: Step-through pattern, Decreased stride length, Decreased dorsiflexion - right, Trunk flexed Gait velocity: reduced Gait velocity interpretation: <1.8 ft/sec, indicate of risk for recurrent falls   General Gait Details: Pt ambulates with a flexed posture, reportedly baseline, and decreased R ankle dorsiflexion and foot clearance with swing phase. Verbal and visual cues provided to clear R foot and perform a heel strike with initial contact. Intermittent support on IV pole but primarily no UE support. No LOB but mild sway and shakiness noted, min guard for safety. Pt often stops ambulating to talk.   Stairs Stairs: Yes  Stairs assistance: Min  guard Stair Management: One rail Right, Step to pattern, Forwards Number of Stairs: 2 General stair comments: Ascends and descends with R rail and step-to pattern, cuing pt to lead up with his L and down with his R leg for improved safety, good success noted, no LOB, min guard for safety   Wheelchair Mobility    Modified Rankin (Stroke Patients Only)       Balance Overall balance assessment: Needs assistance Sitting-balance support: No upper extremity supported, Feet supported Sitting balance-Leahy Scale: Fair     Standing balance support: No upper extremity supported, Single extremity supported, During functional activity Standing balance-Leahy Scale: Fair Standing balance comment: Able to ambulate without UE support, but instability noted                            Cognition Arousal/Alertness: Awake/alert Behavior During Therapy: WFL for tasks assessed/performed Overall Cognitive Status: Within Functional Limits for tasks assessed                                 General Comments: Tangential speech at times, poor recall of precautions and needs cues to maintain attention to learn them, may be baseline        Exercises General Exercises - Lower Extremity Hip ABduction/ADduction: AROM, Strengthening, Both, 10 reps, Standing (with bil UE support) Toe Raises: AROM, Strengthening, Both, 10 reps, Standing (with bil UE support) Heel Raises: AROM, Strengthening, 10 reps, Standing, Both (with bil UE support) Mini-Sqauts: AROM, Strengthening, Both, 10 reps, Standing (with bil UE support and cues to look up to avoid flexing trunk)    General Comments General comments (skin integrity, edema, etc.): encouraged frequent mobility over weekend with nursing staff; educated pt on precautions and car transfers along with frequent mobility and position changes; MedBridge HEP Access Code: [facility]      Pertinent Vitals/Pain Pain Assessment Pain Assessment:  No/denies pain    Home Living                          Prior Function            PT Goals (current goals can now be found in the care plan section) Acute Rehab PT Goals Patient Stated Goal: to go home this weekend PT Goal Formulation: With patient Time For Goal Achievement: 08/23/22 Potential to Achieve Goals: Good Progress towards PT goals: Progressing toward goals    Frequency    Min 5X/week      PT Plan Current plan remains appropriate    Co-evaluation              AM-PAC PT "6 Clicks" Mobility   Outcome Measure  Help needed turning from your back to your side while in a flat bed without using bedrails?: A Little Help needed moving from lying on your back to sitting on the side of a flat bed without using bedrails?: A Little Help needed moving to and from a bed to a chair (including a wheelchair)?: A Little Help needed standing up from a chair using your arms (e.g., wheelchair or bedside chair)?: A Little Help needed to walk in hospital room?: A Little Help needed climbing 3-5 steps with a railing? : A Little 6 Click Score: 18    End of Session Equipment Utilized During Treatment: Gait belt Activity Tolerance: Patient tolerated treatment well Patient  left: in chair;with call bell/phone within reach;with chair alarm set   PT Visit Diagnosis: Unsteadiness on feet (R26.81);Other abnormalities of gait and mobility (R26.89);Muscle weakness (generalized) (M62.81);Difficulty in walking, not elsewhere classified (R26.2);Pain Pain - Right/Left:  (back) Pain - part of body:  (back)     Time: 6213-0865 PT Time Calculation (min) (ACUTE ONLY): 30 min  Charges:  $Gait Training: 8-22 mins $Therapeutic Exercise: 8-22 mins                     Raymond Gurney, PT, DPT Acute Rehabilitation Services  Office: [phone]    Jewel Baize 08/10/2022, 2:25 PM

## 2022-08-10 NOTE — Progress Notes (Signed)
Patient ID: Rodney [name], male   DOB: March 26, 1940, 82 y.o.   MRN: [id number] BP 101/61 (BP Location: Left Arm)   Pulse 65   Temp 98.1 F (36.7 C) (Oral)   Resp 18   Ht 5' 11.5" (1.816 m)   Wt 88.9 kg   SpO2 95%   BMI 26.96 kg/m  Heparin to start tonight Alert and oriented x 4 Speech is clear and fluent Moving all extremities well Coumadin to start tomorrow

## 2022-08-10 NOTE — Plan of Care (Signed)

## 2022-08-11 LAB — HEPARIN LEVEL (UNFRACTIONATED)
Heparin Unfractionated: 0.52 IU/mL (ref 0.30–0.70)
Heparin Unfractionated: 0.57 IU/mL (ref 0.30–0.70)

## 2022-08-11 LAB — CBC
HCT: 33.2 % — ABNORMAL LOW (ref 39.0–52.0)
Hemoglobin: 11 g/dL — ABNORMAL LOW (ref 13.0–17.0)
MCH: 30.7 pg (ref 26.0–34.0)
MCHC: 33.1 g/dL (ref 30.0–36.0)
MCV: 92.7 fL (ref 80.0–100.0)
Platelets: 163 10*3/uL (ref 150–400)
RBC: 3.58 MIL/uL — ABNORMAL LOW (ref 4.22–5.81)
RDW: 13.6 % (ref 11.5–15.5)
WBC: 7.9 10*3/uL (ref 4.0–10.5)
nRBC: 0 % (ref 0.0–0.2)

## 2022-08-11 LAB — PROTIME-INR
INR: 1.1 (ref 0.8–1.2)
Prothrombin Time: 14 seconds (ref 11.4–15.2)

## 2022-08-11 MED ORDER — WARFARIN SODIUM 2.5 MG PO TABS
2.5000 mg | ORAL_TABLET | Freq: Once | ORAL | Status: AC
  Administered 2022-08-11: 2.5 mg via ORAL
  Filled 2022-08-11: qty 1

## 2022-08-11 MED ORDER — WARFARIN - PHARMACIST DOSING INPATIENT: Freq: Every day | Status: DC

## 2022-08-11 NOTE — Progress Notes (Signed)
ANTICOAGULATION CONSULT NOTE - Follow Up Consult  Pharmacy Consult for heparin Indication:  h/o VTE  Labs: Recent Labs    08/08/22 0718 08/09/22 0853 08/10/22 0800 08/11/22 0322  HGB 14.3 13.3 11.5* 11.0*  HCT 42.9 38.9* 35.3* 33.2*  PLT 228 209 161 163  LABPROT 14.2  --   --   --   INR 1.1  --   --   --   HEPARINUNFRC 0.17*  --   --  0.52  CREATININE  --  1.06  --   --     Assessment/Plan:  82yo male therapeutic on heparin after resumed. Will continue infusion at current rate of 1600 units/hr and confirm stable with additional level.  Vernard Gambles, PharmD, BCPS 08/11/2022 4:36 AM

## 2022-08-11 NOTE — Plan of Care (Signed)
Pt alert and oriented x 4. Up 1 assist limited movement due to back surgery and old shoulder injury. Pt received oxycontin scheduled for pain and 1 dose of oxcodone IR. Heparin infusing. Orders placed by pharmacy to recheck level 1000. Dsg to trunk old drainage. Pt stands up to use urinal. Takes meds whole. Vitals stable.  Problem: Education: Goal: Knowledge of General Education information will improve Description: Including pain rating scale, medication(s)/side effects and non-pharmacologic comfort measures Outcome: Progressing   Problem: Health Behavior/Discharge Planning: Goal: Ability to manage health-related needs will improve Outcome: Progressing   Problem: Clinical Measurements: Goal: Ability to maintain clinical measurements within normal limits will improve Outcome: Progressing Goal: Will remain free from infection Outcome: Progressing Goal: Diagnostic test results will improve Outcome: Progressing Goal: Respiratory complications will improve Outcome: Progressing Goal: Cardiovascular complication will be avoided Outcome: Progressing   Problem: Activity: Goal: Risk for activity intolerance will decrease Outcome: Progressing   Problem: Nutrition: Goal: Adequate nutrition will be maintained Outcome: Progressing   Problem: Coping: Goal: Level of anxiety will decrease Outcome: Progressing   Problem: Elimination: Goal: Will not experience complications related to bowel motility Outcome: Progressing Goal: Will not experience complications related to urinary retention Outcome: Progressing   Problem: Pain Managment: Goal: General experience of comfort will improve Outcome: Progressing   Problem: Safety: Goal: Ability to remain free from injury will improve Outcome: Progressing   Problem: Skin Integrity: Goal: Risk for impaired skin integrity will decrease Outcome: Progressing   Problem: Education: Goal: Ability to verbalize activity precautions or restrictions  will improve Outcome: Progressing Goal: Knowledge of the prescribed therapeutic regimen will improve Outcome: Progressing Goal: Understanding of discharge needs will improve Outcome: Progressing   Problem: Activity: Goal: Ability to avoid complications of mobility impairment will improve Outcome: Progressing Goal: Ability to tolerate increased activity will improve Outcome: Progressing Goal: Will remain free from falls Outcome: Progressing   Problem: Bowel/Gastric: Goal: Gastrointestinal status for postoperative course will improve Outcome: Progressing   Problem: Clinical Measurements: Goal: Ability to maintain clinical measurements within normal limits will improve Outcome: Progressing Goal: Postoperative complications will be avoided or minimized Outcome: Progressing Goal: Diagnostic test results will improve Outcome: Progressing   Problem: Pain Management: Goal: Pain level will decrease Outcome: Progressing   Problem: Skin Integrity: Goal: Will show signs of wound healing Outcome: Progressing   Problem: Health Behavior/Discharge Planning: Goal: Identification of resources available to assist in meeting health care needs will improve Outcome: Progressing   Problem: Bladder/Genitourinary: Goal: Urinary functional status for postoperative course will improve Outcome: Progressing

## 2022-08-11 NOTE — Progress Notes (Addendum)
ANTICOAGULATION CONSULT NOTE - Initial Consult  Pharmacy Consult for heparin >> warfarin with heparin bridge Indication:  hx PE  Allergies  Allergen Reactions   Blood-Group Specific Substance     Must be Leukocyte Reduced and Irradiated due to stem cell transplant.    Prednisone Other (See Comments)    Agitation  Anger    Patient Measurements: Height: 5' 11.5" (181.6 cm) Weight: 88.9 kg (196 lb) IBW/kg (Calculated) : 76.45  Vital Signs: BP: 125/74 (06/08 0358) Pulse Rate: 69 (06/08 0358)  Labs: Recent Labs    08/09/22 0853 08/10/22 0800 08/11/22 0322  HGB 13.3 11.5* 11.0*  HCT 38.9* 35.3* 33.2*  PLT 209 161 163  HEPARINUNFRC  --   --  0.52  CREATININE 1.06  --   --      Estimated Creatinine Clearance: 61.1 mL/min (by C-G formula based on SCr of 1.06 mg/dL).   Medical History: Past Medical History:  Diagnosis Date   Anemia 02-16-11   01-05-11- post surgery-Transfusions x 4 units   Arthritis    Bone pain 02/18/2013   Cellulitis    Clotting disorder (HCC)    Complication of anesthesia 02-16-11   Pt. speaks of awakening during the surgery from back  surgery   Degenerative disc disease 02-16-11   01-05-11 L3 fusion done   Esophageal reflux 05/31/2008   Qualifier: Diagnosis of  By: Lovell Sheehan MD, Balinda Quails    Hernia 02-16-11   left inguinal hernia at present   Hypothyroidism 02/01/2014   Multiple myeloma 02-16-11   suspected tumor  left sacral   Pancreatitis    Personal history of traumatic fracture 07/10/2012   Prostate cancer [facility])    Pulmonary embolism (HCC)    Second degree atrioventricular block 11/26/2012   Shortness of breath 02-16-11   hx. Pulmonary emboli x2 (9 yrs/ 3'12 -last)    Assessment: 48 yoM admitted for spinal surgery 6/5. Pt on warfarin PTA for hx PE. Pharmacy originally consulted to dose heparin preop/postop and now transitioning back to warfarin. Per discussion with neurosurgery, will continue heparin today and discontinue prior to discharge  (likely tomorrow) or whenever INR is therapeutic, whichever comes first.   PTA warfarin regimen is 2.5mg  daily  INR is currently subtherapeutic at 1.1. INR goal is ~1.5 given history of nosebleeds. Will avoid giving a higher dose of warfarin today given recent spinal surgery and history of nosebleeds.    Heparin level today remains therapeutic at 0.57, on 1600 units/hr. Heparin level goal is 0.3-0.7. Hgb 11, plt 163--stable. No line issues or signs/symptoms of bleeding reported.  Plan:  Continue heparin gtt @1600  units/hr Give warfarin 2.5mg  x1 tonight  Monitor CBC, heparin level, INR daily Monitor for s/sx of bleeding    Cherylin Mylar, PharmD PGY1 Pharmacy Resident 6/8/20249:11 AM

## 2022-08-11 NOTE — Progress Notes (Addendum)
Subjective: The patient is alert and pleasant.  He wants to go home tomorrow.  Objective: Vital signs in last 24 hours: Temp:  [98 F (36.7 C)-98.1 F (36.7 C)] 98 F (36.7 C) (06/07 1924) Pulse Rate:  [65-71] 69 (06/08 0358) Resp:  [18] 18 (06/08 0357) BP: (101-125)/(54-74) 125/74 (06/08 0358) SpO2:  [94 %-97 %] 94 % (06/08 0358) Estimated body mass index is 26.96 kg/m as calculated from the following:   Height as of this encounter: 5' 11.5" (1.816 m).   Weight as of this encounter: 88.9 kg.   Intake/Output from previous day: 06/07 0701 - 06/08 0700 In: 1931.7 [P.O.:60; I.V.:1871.7] Out: 400 [Urine:400] Intake/Output this shift: No intake/output data recorded.  Physical exam the patient is alert and oriented.  He is moving all 4 extremities well.  Lab Results: Recent Labs    08/10/22 0800 08/11/22 0322  WBC 8.2 7.9  HGB 11.5* 11.0*  HCT 35.3* 33.2*  PLT 161 163   BMET Recent Labs    08/09/22 0853  CREATININE 1.06    Studies/Results: No results found.  Assessment/Plan: Postop day #4: The plan is to go home tomorrow.  Per Dr. Franky Macho, I will ask pharmacy to resume his Coumadin.  LOS: 4 days     Rodney [name] 08/11/2022, 8:59 AM     Patient ID: Rodney [name], male   DOB: 06-Apr-1940, 82 y.o.   MRN: [id number]

## 2022-08-12 LAB — PROTIME-INR
INR: 1.1 (ref 0.8–1.2)
Prothrombin Time: 14.6 seconds (ref 11.4–15.2)

## 2022-08-12 LAB — CBC
HCT: 35.4 % — ABNORMAL LOW (ref 39.0–52.0)
Hemoglobin: 11.5 g/dL — ABNORMAL LOW (ref 13.0–17.0)
MCH: 30.6 pg (ref 26.0–34.0)
MCHC: 32.5 g/dL (ref 30.0–36.0)
MCV: 94.1 fL (ref 80.0–100.0)
Platelets: 173 10*3/uL (ref 150–400)
RBC: 3.76 MIL/uL — ABNORMAL LOW (ref 4.22–5.81)
RDW: 14 % (ref 11.5–15.5)
WBC: 8.1 10*3/uL (ref 4.0–10.5)
nRBC: 0 % (ref 0.0–0.2)

## 2022-08-12 LAB — HEPARIN LEVEL (UNFRACTIONATED): Heparin Unfractionated: 0.44 IU/mL (ref 0.30–0.70)

## 2022-08-12 MED ORDER — WARFARIN SODIUM 2.5 MG PO TABS
2.5000 mg | ORAL_TABLET | Freq: Once | ORAL | Status: AC
  Administered 2022-08-12: 2.5 mg via ORAL
  Filled 2022-08-12: qty 1

## 2022-08-12 NOTE — Progress Notes (Signed)
Patient assisted out of bed to bedside chair after voiding at the bedside with urinal; reports soreness today and stiffness; slow ambulation to the bedside chair; high fall risk; continue to supervise out of bed activity; no acute distress.

## 2022-08-12 NOTE — Progress Notes (Signed)
   Providing Compassionate, Quality Care - Together  NEUROSURGERY PROGRESS NOTE   S: No issues overnight.  Somewhat unsteady this a.m., states he feels slightly shaky  O: EXAM:  BP 127/80 (BP Location: Left Arm)   Pulse 66   Temp 97.6 F (36.4 C) (Oral)   Resp 15   Ht 5' 11.5" (1.816 m)   Wt 88.9 kg   SpO2 93%   BMI 26.96 kg/m   Awake, alert, oriented x 3 PERRL Speech fluent, appropriate  CNs grossly intact  5/5 BUE/BLE  Dressing clean dry and intact with slight staining  ASSESSMENT:  82 y.o. male with   L4-5 lumbar spondylosis, stenosis status post decompression and fusion  PLAN: -Offered him discharge home which he is unsure of at this time.  He will let us know -Coumadin resumption -Continue therapies    Thank you for allowing me to participate in this patient's care.  Please do not hesitate to call with questions or concerns.   Monia Pouch, DO Neurosurgeon [facility] Cell: [phone]

## 2022-08-12 NOTE — Plan of Care (Signed)
  Problem: Education: Goal: Knowledge of General Education information will improve Description: Including pain rating scale, medication(s)/side effects and non-pharmacologic comfort measures Outcome: Progressing   Problem: Health Behavior/Discharge Planning: Goal: Ability to manage health-related needs will improve Outcome: Progressing   Problem: Clinical Measurements: Goal: Ability to maintain clinical measurements within normal limits will improve Outcome: Progressing Goal: Will remain free from infection Outcome: Progressing Goal: Diagnostic test results will improve Outcome: Progressing Goal: Respiratory complications will improve Outcome: Progressing Goal: Cardiovascular complication will be avoided Outcome: Progressing   Problem: Activity: Goal: Risk for activity intolerance will decrease Outcome: Progressing   Problem: Nutrition: Goal: Adequate nutrition will be maintained Outcome: Progressing   Problem: Coping: Goal: Level of anxiety will decrease Outcome: Progressing   Problem: Elimination: Goal: Will not experience complications related to bowel motility Outcome: Progressing Goal: Will not experience complications related to urinary retention Outcome: Progressing   Problem: Pain Managment: Goal: General experience of comfort will improve Outcome: Progressing   Problem: Safety: Goal: Ability to remain free from injury will improve Outcome: Progressing   Problem: Skin Integrity: Goal: Risk for impaired skin integrity will decrease Outcome: Progressing   Problem: Education: Goal: Ability to verbalize activity precautions or restrictions will improve Outcome: Progressing Goal: Knowledge of the prescribed therapeutic regimen will improve Outcome: Progressing Goal: Understanding of discharge needs will improve Outcome: Progressing   Problem: Activity: Goal: Ability to avoid complications of mobility impairment will improve Outcome: Progressing Goal:  Ability to tolerate increased activity will improve Outcome: Progressing Goal: Will remain free from falls Outcome: Progressing   Problem: Bowel/Gastric: Goal: Gastrointestinal status for postoperative course will improve Outcome: Progressing   Problem: Clinical Measurements: Goal: Ability to maintain clinical measurements within normal limits will improve Outcome: Progressing Goal: Postoperative complications will be avoided or minimized Outcome: Progressing Goal: Diagnostic test results will improve Outcome: Progressing   Problem: Skin Integrity: Goal: Will show signs of wound healing Outcome: Progressing   Problem: Health Behavior/Discharge Planning: Goal: Identification of resources available to assist in meeting health care needs will improve Outcome: Progressing   Problem: Bladder/Genitourinary: Goal: Urinary functional status for postoperative course will improve Outcome: Progressing

## 2022-08-12 NOTE — Progress Notes (Signed)
ANTICOAGULATION CONSULT NOTE - Initial Consult  Pharmacy Consult for heparin >> warfarin with heparin bridge Indication:  hx PE  Allergies  Allergen Reactions   Blood-Group Specific Substance     Must be Leukocyte Reduced and Irradiated due to stem cell transplant.    Prednisone Other (See Comments)    Agitation  Anger    Patient Measurements: Height: 5' 11.5" (181.6 cm) Weight: 88.9 kg (196 lb) IBW/kg (Calculated) : 76.45  Vital Signs: Temp: 97.6 F (36.4 C) (06/09 0804) Temp Source: Oral (06/09 0804) BP: 127/80 (06/09 0804) Pulse Rate: 66 (06/09 0804)  Labs: Recent Labs    08/09/22 0853 08/10/22 0800 08/11/22 0322 08/11/22 0956 08/12/22 0526  HGB 13.3 11.5* 11.0*  --  11.5*  HCT 38.9* 35.3* 33.2*  --  35.4*  PLT 209 161 163  --  173  LABPROT  --   --   --  14.0 14.6  INR  --   --   --  1.1 1.1  HEPARINUNFRC  --   --  0.52 0.57 0.44  CREATININE 1.06  --   --   --   --      Estimated Creatinine Clearance: 61.1 mL/min (by C-G formula based on SCr of 1.06 mg/dL).   Medical History: Past Medical History:  Diagnosis Date   Anemia 02-16-11   01-05-11- post surgery-Transfusions x 4 units   Arthritis    Bone pain 02/18/2013   Cellulitis    Clotting disorder (HCC)    Complication of anesthesia 02-16-11   Pt. speaks of awakening during the surgery from back  surgery   Degenerative disc disease 02-16-11   01-05-11 L3 fusion done   Esophageal reflux 05/31/2008   Qualifier: Diagnosis of  By: Lovell Sheehan MD, Balinda Quails    Hernia 02-16-11   left inguinal hernia at present   Hypothyroidism 02/01/2014   Multiple myeloma 02-16-11   suspected tumor  left sacral   Pancreatitis    Personal history of traumatic fracture 07/10/2012   Prostate cancer [facility])    Pulmonary embolism (HCC)    Second degree atrioventricular block 11/26/2012   Shortness of breath 02-16-11   hx. Pulmonary emboli x2 (9 yrs/ 3'12 -last)    Assessment: 22 yoM admitted for spinal surgery 6/5. Pt on  warfarin PTA for hx PE. Pharmacy originally consulted to dose heparin preop/postop and now transitioning back to warfarin. Per discussion with neurosurgery, will continue heparin today and discontinue prior to discharge (likely tomorrow) or whenever INR is therapeutic, whichever comes first.   PTA warfarin regimen is 2.5mg  daily  INR remains subtherapeutic at 1.1. INR goal is ~1.5 given history of nosebleeds. Effects of yesterday's warfarin dose will likely be seen in tomorrow's INR. Will avoid giving a higher dose of warfarin today given recent spinal surgery and history of nosebleeds.  Heparin level today remains therapeutic at 0.44, on 1600 units/hr. Heparin level goal is 0.3-0.7. Hgb 11.5, plt 173--stable. No line issues or signs/symptoms of bleeding reported.  Plan:  Continue heparin gtt @1600  units/hr Give warfarin 2.5mg  x1 again tonight  Monitor CBC, heparin level, INR daily Monitor for s/sx of bleeding    Cherylin Mylar, PharmD PGY1 Pharmacy Resident 6/9/20248:28 AM

## 2022-08-13 ENCOUNTER — Inpatient Hospital Stay (HOSPITAL_COMMUNITY): Payer: Medicare Other

## 2022-08-13 LAB — HEPARIN LEVEL (UNFRACTIONATED)
Heparin Unfractionated: 0.34 IU/mL (ref 0.30–0.70)
Heparin Unfractionated: 0.32 IU/mL (ref 0.30–0.70)

## 2022-08-13 LAB — CBC
HCT: 36.1 % — ABNORMAL LOW (ref 39.0–52.0)
Hemoglobin: 12 g/dL — ABNORMAL LOW (ref 13.0–17.0)
MCH: 31.7 pg (ref 26.0–34.0)
MCHC: 33.2 g/dL (ref 30.0–36.0)
MCV: 95.3 fL (ref 80.0–100.0)
Platelets: 204 10*3/uL (ref 150–400)
RBC: 3.79 MIL/uL — ABNORMAL LOW (ref 4.22–5.81)
RDW: 14.2 % (ref 11.5–15.5)
WBC: 8.9 10*3/uL (ref 4.0–10.5)
nRBC: 0 % (ref 0.0–0.2)

## 2022-08-13 LAB — PROTIME-INR
INR: 1.2 (ref 0.8–1.2)
Prothrombin Time: 15.1 seconds (ref 11.4–15.2)

## 2022-08-13 MED ORDER — WARFARIN SODIUM 2.5 MG PO TABS
2.5000 mg | ORAL_TABLET | Freq: Once | ORAL | Status: AC
  Administered 2022-08-13: 2.5 mg via ORAL
  Filled 2022-08-13: qty 1

## 2022-08-13 MED ORDER — HEPARIN (PORCINE) 25000 UT/250ML-% IV SOLN
1750.0000 [IU]/h | INTRAVENOUS | Status: DC
  Administered 2022-08-14 (×2): 1750 [IU]/h via INTRAVENOUS
  Filled 2022-08-13 (×2): qty 250

## 2022-08-13 NOTE — Progress Notes (Signed)
ANTICOAGULATION CONSULT NOTE - follow-up  Pharmacy Consult for heparin >> warfarin with heparin bridge Indication:  hx PE  Allergies  Allergen Reactions   Blood-Group Specific Substance     Must be Leukocyte Reduced and Irradiated due to stem cell transplant.    Prednisone Other (See Comments)    Agitation  Anger    Patient Measurements: Height: 5' 11.5" (181.6 cm) Weight: 88.9 kg (196 lb) IBW/kg (Calculated) : 76.45  Vital Signs: Temp: 98.5 F (36.9 C) (06/10 0741) Temp Source: Oral (06/10 0741) BP: 109/70 (06/10 0741) Pulse Rate: 67 (06/10 0741)  Labs: Recent Labs    08/11/22 0322 08/11/22 0956 08/12/22 0526 08/13/22 0722  HGB 11.0*  --  11.5* 12.0*  HCT 33.2*  --  35.4* 36.1*  PLT 163  --  173 204  LABPROT  --  14.0 14.6 15.1  INR  --  1.1 1.1 1.2  HEPARINUNFRC 0.52 0.57 0.44 0.34     Estimated Creatinine Clearance: 61.1 mL/min (by C-G formula based on SCr of 1.06 mg/dL).   Medical History: Past Medical History:  Diagnosis Date   Anemia 02-16-11   01-05-11- post surgery-Transfusions x 4 units   Arthritis    Bone pain 02/18/2013   Cellulitis    Clotting disorder (HCC)    Complication of anesthesia 02-16-11   Pt. speaks of awakening during the surgery from back  surgery   Degenerative disc disease 02-16-11   01-05-11 L3 fusion done   Esophageal reflux 05/31/2008   Qualifier: Diagnosis of  By: Lovell Sheehan MD, Balinda Quails    Hernia 02-16-11   left inguinal hernia at present   Hypothyroidism 02/01/2014   Multiple myeloma 02-16-11   suspected tumor  left sacral   Pancreatitis    Personal history of traumatic fracture 07/10/2012   Prostate cancer [facility])    Pulmonary embolism (HCC)    Second degree atrioventricular block 11/26/2012   Shortness of breath 02-16-11   hx. Pulmonary emboli x2 (9 yrs/ 3'12 -last)    Assessment: 71 yoM admitted for spinal surgery 6/5. Pt on warfarin PTA for hx PE. Pharmacy originally consulted to dose heparin preop/postop and now  transitioning back to warfarin. Per discussion with neurosurgery, will continue heparin today and discontinue prior to discharge (likely tomorrow) or whenever INR is therapeutic, whichever comes first.   PTA warfarin regimen is 2.5mg  daily  6/10 AM update: HL: 0.34 INR 1.2 No signs of bleeding or issues with the gtt  Goal: Heparin level 0.3-0.7 INR  ~1.5 given recent NSGY and history of nose bleeds  Plan:  increase heparin gtt @1700  units/hr Give warfarin 2.5 mg x1 tonight  Monitor CBC, heparin level, INR daily Monitor for s/sx of bleeding    Audryanna Zurita BS, PharmD, BCPS Clinical Pharmacist 08/13/2022 9:10 AM  Contact: [phone] after 3 PM  "Be curious, not judgmental..." -Debbora Dus

## 2022-08-13 NOTE — Progress Notes (Addendum)
Patient ID: Rodney [name], male   DOB: [birth year], 82 y.o.   MRN: [id number] Alert and oriented x 4 Speech is clear and fluent Moving all extremities well Discharge tomorrow BP 109/73 (BP Location: Left Arm)   Pulse 76   Temp 98.6 F (37 C) (Oral)   Resp 20   Ht 5' 11.5" (1.816 m)   Wt 88.9 kg   SpO2 94%   BMI 26.96 kg/m  Complaining of right shoulder pain, weakness with internal rotation, abduction, flexion, and extrension

## 2022-08-13 NOTE — Progress Notes (Signed)
Physical Therapy Treatment Patient Details Name: Rodney [name] MRN: [id number] DOB: 12/05/1940 Today's Date: 08/13/2022   History of Present Illness Pt is a 82 y.o. male who presented 08/07/22 for heparin treatment pre and post op. S/p L4-5 PLIF, laminectomy L4, spire plate placement L3-5 on 6/5. PMH: former smoker (1970), multiple myeloma, s/p chemoradiation, autologous stem cell transplant 11/16/11, pancreatitis, 2nd degree AV block, prostate CA, spinal surgeries, cholecystectomy 2012, anemia, clotting disorder, hypothyroidism, PE    PT Comments    Pt was able to ambulate an increased distance today but needed intermittent minA to regain balance when he would stumble on his R foot due to poor R foot clearance. He displayed an increased shuffling gait pattern bil today also and did agree to utilize the RW to manage his pain and improve his balance. As pt is at risk for falls, educated him to utilize his RW at d/c and have his wife guard him. He verbalized understanding. Finished session with several lower extremity exercises to improve bil lower extremity strength (particularly targeting his R leg) to facilitate improved foot clearance with gait. Improved recall of spinal precautions but needs assistance to recall all 3 and cues for compliance intermittently throughout. Will continue to follow acutely.     Recommendations for follow up therapy are one component of a multi-disciplinary discharge planning process, led by the attending physician.  Recommendations may be updated based on patient status, additional functional criteria and insurance authorization.  Follow Up Recommendations       Assistance Recommended at Discharge Intermittent Supervision/Assistance  Patient can return home with the following A little help with walking and/or transfers;A little help with bathing/dressing/bathroom;Assistance with cooking/housework;Help with stairs or ramp for entrance;Assist for transportation    Equipment Recommendations  None recommended by PT (pt declining DME)    Recommendations for Other Services       Precautions / Restrictions Precautions Precautions: Fall;Back Precaution Booklet Issued: Yes (comment) Precaution Comments: reviewed precautions, recalls no bending, needs cues to recall no twisting, unable to recall no lifting - needs cues for compliance intermittently Required Braces or Orthoses:  (no brace needed orders) Restrictions Weight Bearing Restrictions: No     Mobility  Bed Mobility Overal bed mobility: Needs Assistance Bed Mobility: Rolling, Sidelying to Sit Rolling: Min guard Sidelying to sit: Min guard       General bed mobility comments: Bed flat and no use of rails to simulate home set-up. Pt initiates and performs transition sidelying to sit L EOB while maintaining spinal precautions without many cues, extra time though due to pain, min guard for safety.    Transfers Overall transfer level: Needs assistance Equipment used: None Transfers: Sit to/from Stand Sit to Stand: Min guard, Min assist           General transfer comment: Pt scooting himself anteriorly on EOB and placing feet appropriately for set-up with transfers without needing cues, extra time for set-up and power up to stand, min guard for safety. MinA to stand when cued to transfer from recliner with L foot anterior to R to challenge R musculature, difficulty completining 2nd rep like this thus pt sat back down and performed other exercises.    Ambulation/Gait Ambulation/Gait assistance: Min guard, Min assist Gait Distance (Feet): 395 Feet Assistive device: None, Rolling walker (2 wheels) Gait Pattern/deviations: Step-through pattern, Decreased stride length, Decreased dorsiflexion - right, Trunk flexed, Shuffle Gait velocity: reduced Gait velocity interpretation: <1.8 ft/sec, indicate of risk for recurrent falls   General Gait  Details: Pt continues to ambulate with a flexed  posture, reportedly baseline, and decreased R ankle dorsiflexion and foot clearance with swing phase. Pt with increased bil shuffling of feet today and requesting RW for support after ambulating to the door in the room without UE support. Min guard for safety majority of time but intermittent minA needed to ensure pt regained balance when he would stumble due to poor R foot clearance. Verbal, visual, and tactile cues provided for heel strike with gait and to remain proximal to RW as he would tend to push it distally as he fatigued, resulting in more anterior lean of body and less space to clear feet.   Stairs             Wheelchair Mobility    Modified Rankin (Stroke Patients Only)       Balance Overall balance assessment: Needs assistance Sitting-balance support: No upper extremity supported, Feet supported Sitting balance-Leahy Scale: Fair     Standing balance support: No upper extremity supported, During functional activity, Bilateral upper extremity supported Standing balance-Leahy Scale: Fair Standing balance comment: Able to ambulate without UE support, but intermittent LOB needing minA to recover, benefits from RW at this time                            Cognition Arousal/Alertness: Awake/alert Behavior During Therapy: WFL for tasks assessed/performed Overall Cognitive Status: Within Functional Limits for tasks assessed                                 General Comments: Recalls no bending, needs cues to recall no twisting, unable to recall no lifting for spinal precautions - needs cues for compliance intermittently. Likely baseline        Exercises General Exercises - Lower Extremity Hip Flexion/Marching: AROM, Strengthening, Both, 10 reps, Seated Toe Raises: AROM, Strengthening, Both, 10 reps, Seated Other Exercises Other Exercises: sit <> stand from recliner x3 attempts (unsuccessful on 3rd rep) with L foot slightly anterior to R and use of  bil UEs, minA    General Comments General comments (skin integrity, edema, etc.): educated pt on recs for wife to guard him and for pt to utilize his RW at d/c at this time, he verbalized understanding      Pertinent Vitals/Pain Pain Assessment Pain Assessment: Faces Faces Pain Scale: Hurts even more Pain Location: back Pain Descriptors / Indicators: Discomfort, Grimacing, Operative site guarding Pain Intervention(s): Monitored during session, Limited activity within patient's tolerance, Repositioned    Home Living                          Prior Function            PT Goals (current goals can now be found in the care plan section) Acute Rehab PT Goals Patient Stated Goal: to go home and reduce pain PT Goal Formulation: With patient Time For Goal Achievement: 08/23/22 Potential to Achieve Goals: Good Progress towards PT goals: Progressing toward goals    Frequency    Min 5X/week      PT Plan Current plan remains appropriate    Co-evaluation              AM-PAC PT "6 Clicks" Mobility   Outcome Measure  Help needed turning from your back to your side while in a flat bed without using  bedrails?: A Little Help needed moving from lying on your back to sitting on the side of a flat bed without using bedrails?: A Little Help needed moving to and from a bed to a chair (including a wheelchair)?: A Little Help needed standing up from a chair using your arms (e.g., wheelchair or bedside chair)?: A Little Help needed to walk in hospital room?: A Little Help needed climbing 3-5 steps with a railing? : A Little 6 Click Score: 18    End of Session Equipment Utilized During Treatment: Gait belt Activity Tolerance: Patient tolerated treatment well Patient left: in chair;with call bell/phone within reach;with chair alarm set   PT Visit Diagnosis: Unsteadiness on feet (R26.81);Other abnormalities of gait and mobility (R26.89);Muscle weakness (generalized)  (M62.81);Difficulty in walking, not elsewhere classified (R26.2);Pain Pain - Right/Left:  (back) Pain - part of body:  (back)     Time: 1610-9604 PT Time Calculation (min) (ACUTE ONLY): 32 min  Charges:  $Gait Training: 8-22 mins $Therapeutic Exercise: 8-22 mins                     Raymond Gurney, PT, DPT Acute Rehabilitation Services  Office: [phone]    Jewel Baize 08/13/2022, 9:30 AM

## 2022-08-13 NOTE — Progress Notes (Signed)
ANTICOAGULATION CONSULT NOTE - follow-up  Pharmacy Consult for heparin >> warfarin with heparin bridge Indication:  hx PE  Allergies  Allergen Reactions   Blood-Group Specific Substance     Must be Leukocyte Reduced and Irradiated due to stem cell transplant.    Prednisone Other (See Comments)    Agitation  Anger    Patient Measurements: Height: 5' 11.5" (181.6 cm) Weight: 88.9 kg (196 lb) IBW/kg (Calculated) : 76.45  Vital Signs: Temp: 98.4 F (36.9 C) (06/10 1506) Temp Source: Oral (06/10 1506) BP: 109/75 (06/10 1506) Pulse Rate: 70 (06/10 1506)  Labs: Recent Labs    08/11/22 0322 08/11/22 0956 08/12/22 0526 08/13/22 0722 08/13/22 1817  HGB 11.0*  --  11.5* 12.0*  --   HCT 33.2*  --  35.4* 36.1*  --   PLT 163  --  173 204  --   LABPROT  --  14.0 14.6 15.1  --   INR  --  1.1 1.1 1.2  --   HEPARINUNFRC 0.52 0.57 0.44 0.34 0.32     Estimated Creatinine Clearance: 61.1 mL/min (by C-G formula based on SCr of 1.06 mg/dL).   Medical History: Past Medical History:  Diagnosis Date   Anemia 02-16-11   01-05-11- post surgery-Transfusions x 4 units   Arthritis    Bone pain 02/18/2013   Cellulitis    Clotting disorder (HCC)    Complication of anesthesia 02-16-11   Pt. speaks of awakening during the surgery from back  surgery   Degenerative disc disease 02-16-11   01-05-11 L3 fusion done   Esophageal reflux 05/31/2008   Qualifier: Diagnosis of  By: Lovell Sheehan MD, Balinda Quails    Hernia 02-16-11   left inguinal hernia at present   Hypothyroidism 02/01/2014   Multiple myeloma 02-16-11   suspected tumor  left sacral   Pancreatitis    Personal history of traumatic fracture 07/10/2012   Prostate cancer [facility])    Pulmonary embolism (HCC)    Second degree atrioventricular block 11/26/2012   Shortness of breath 02-16-11   hx. Pulmonary emboli x2 (9 yrs/ 3'12 -last)    Assessment: 64 yoM admitted for spinal surgery 6/5. Pt on warfarin PTA for hx PE. Pharmacy originally  consulted to dose heparin preop/postop and now transitioning back to warfarin. Per discussion with neurosurgery, will continue heparin today and discontinue prior to discharge (likely tomorrow) or whenever INR is therapeutic, whichever comes first.   PTA warfarin regimen is 2.5mg  daily  6/10 PM update: HL: 0.32, trending down despite rate increase earlier today No signs of bleeding or issues with the gtt reported  Goal: Heparin level 0.3-0.7 INR  ~1.5 given recent NSGY and history of nose bleeds  Plan:  Increase heparin gtt slightly to 1750 units/hr Monitor CBC, heparin level, INR daily Monitor for s/sx of bleeding    Loralee Pacas, PharmD, BCPS 08/13/2022 7:35 PM  Please check AMION for all [facility] Pharmacy phone numbers After 10:00 PM, call Main Pharmacy [phone]

## 2022-08-13 NOTE — TOC Progression Note (Signed)
Transition of Care [facility]) - Progression Note    Patient Details  Name: Rodney [name] MRN: [id number] Date of Birth: 05/04/1940  Transition of [facility]) CM/SW Contact  Kermit Balo, RN Phone Number: 08/13/2022, 3:55 PM  Clinical Narrative:    Coumadin resumed.  Awaiting pt to be medically ready.  Pt refused Home health. TOC following.   Expected Discharge Plan: Home/Self Care Barriers to Discharge: Continued Medical Work up  Expected Discharge Plan and Services   Discharge Planning Services: CM Consult   Living arrangements for the past 2 months: Single Family Home                 DME Arranged: Bedside commode DME Agency: [employer] Date DME Agency Contacted: 08/09/22   Representative spoke with at DME Agency: Marthann Schiller             Social Determinants of Health (SDOH) Interventions SDOH Screenings   Food Insecurity: No Food Insecurity (08/07/2022)  Housing: Low Risk  (08/07/2022)  Transportation Needs: No Transportation Needs (08/07/2022)  Utilities: Not At Risk (08/07/2022)  Depression (PHQ2-9): Low Risk  (09/26/2020)  Tobacco Use: Medium Risk (08/08/2022)    Readmission Risk Interventions     No data to display

## 2022-08-13 NOTE — Progress Notes (Signed)
Occupational Therapy Treatment Patient Details Name: Rodney [name] MRN: [id number] DOB: March 28, 1940 Today's Date: 08/13/2022   History of present illness Pt is a 82 y.o. male who presented 08/07/22 for heparin treatment pre and post op. S/p L4-5 PLIF, laminectomy L4, spire plate placement L3-5 on 6/5. PMH: former smoker (1970), multiple myeloma, s/p chemoradiation, autologous stem cell transplant 11/16/11, pancreatitis, 2nd degree AV block, prostate CA, spinal surgeries, cholecystectomy 2012, anemia, clotting disorder, hypothyroidism, PE   OT comments  Pt currently at min assist level for simulated walk-in shower transfers and min to mod for simulated LB selfcare.  Pt's spouse present for session and states that she can provide 24 hour assist post discharge.  Feel he is making steady progress and will benefit from continued acute care OT at this time.  Recommend follow-up post acute HHOT to continue progression with ADLs.     Recommendations for follow up therapy are one component of a multi-disciplinary discharge planning process, led by the attending physician.  Recommendations may be updated based on patient status, additional functional criteria and insurance authorization.    Assistance Recommended at Discharge PRN  Patient can return home with the following  A little help with walking and/or transfers;A little help with bathing/dressing/bathroom;Help with stairs or ramp for entrance;Assistance with cooking/housework   Equipment Recommendations  BSC/3in1       Precautions / Restrictions Precautions Precautions: Fall;Back Precaution Booklet Issued: Yes (comment) Precaution Comments: Able to recall 2/3 precautions Required Braces or Orthoses:  (no brace needed orders) Restrictions Weight Bearing Restrictions: No       Mobility Bed Mobility Overal bed mobility: Needs Assistance Bed Mobility: Rolling, Sidelying to Sit, Sit to Sidelying Rolling: Min guard       Sit to  sidelying: Min assist General bed mobility comments: Pt needs increased time for rolling and transition from supine to sit.  Min assist for lifting LEs back in the bed when laying down.    Transfers                         Balance Overall balance assessment: Needs assistance Sitting-balance support: No upper extremity supported, Feet supported Sitting balance-Leahy Scale: Fair     Standing balance support: Bilateral upper extremity supported, During functional activity Standing balance-Leahy Scale: Fair Standing balance comment: Needs UE support on walker with mobility.                           ADL either performed or assessed with clinical judgement   ADL Overall ADL's : Needs assistance/impaired                                 Tub/ Shower Transfer: Walk-in shower;Minimal assistance;Ambulation;Shower seat;Rolling walker (2 wheels)   Functional mobility during ADLs: Minimal assistance;Rolling walker (2 wheels) General ADL Comments: Pt's spouse present for session.  Discussed back precautions and need for a reacher or two at home to assist when needed as items or dropped or with toileting if pants fall around ankles.  Also discussed need for 3:1 which is already in room to take home and need for shower seat vs 3:1 in the shower.  They will see if they can borrow from a friend.  Completed simulated walk-in shower showing two methods and two different placements for shower seat.  Pt reports having grab bars to install but has not  had them put in yet.  Educated on removal of any throw rugs in the bathroom and house with encouragement to use the RW initially for mobility and transfers with direct supervision from spouse to start.  They voice understanding.      Cognition Arousal/Alertness: Lethargic Behavior During Therapy: WFL for tasks assessed/performed Overall Cognitive Status: Within Functional Limits for tasks assessed                                  General Comments: Pt able to recall 2/3 precautions.  Increased sleepiness noted this session compared to previous session.              General Comments educated pt on recs for wife to guard him and for pt to utilize his RW at d/c at this time, he verbalized understanding    Pertinent Vitals/ Pain       Pain Assessment Pain Assessment: Faces Faces Pain Scale: Hurts a little bit Pain Location: back Pain Descriptors / Indicators: Discomfort Pain Intervention(s): Repositioned, Monitored during session            Progress Toward Goals  OT Goals(current goals can now be found in the care plan section)  Progress towards OT goals: Progressing toward goals  Acute Rehab OT Goals Patient Stated Goal: Pt did not state, but is hopeful to go home soon. OT Goal Formulation: With patient/family Time For Goal Achievement: 08/23/22 Potential to Achieve Goals: Good  Plan Discharge plan remains appropriate       AM-PAC OT "6 Clicks" Daily Activity     Outcome Measure   Help from another person eating meals?: None Help from another person taking care of personal grooming?: A Little Help from another person toileting, which includes using toliet, bedpan, or urinal?: A Little Help from another person bathing (including washing, rinsing, drying)?: A Little Help from another person to put on and taking off regular upper body clothing?: A Lot Help from another person to put on and taking off regular lower body clothing?: A Lot 6 Click Score: 17    End of Session Equipment Utilized During Treatment: Rolling walker (2 wheels)  OT Visit Diagnosis: Unsteadiness on feet (R26.81);Muscle weakness (generalized) (M62.81);Pain;Other abnormalities of gait and mobility (R26.89) Pain - Right/Left:  (back)   Activity Tolerance Patient tolerated treatment well   Patient Left in bed;with call bell/phone within reach;with bed alarm set   Nurse Communication          Time:  1610-9604 OT Time Calculation (min): 35 min  Charges: OT General Charges $OT Visit: 1 Visit OT Treatments $Self Care/Home Management : 23-37 mins  Perrin Maltese, OTR/L Acute Rehabilitation Services  Office [phone] 08/13/2022

## 2022-08-14 ENCOUNTER — Inpatient Hospital Stay (HOSPITAL_COMMUNITY): Payer: Medicare Other

## 2022-08-14 LAB — CBC
HCT: 35.3 % — ABNORMAL LOW (ref 39.0–52.0)
Hemoglobin: 11.8 g/dL — ABNORMAL LOW (ref 13.0–17.0)
MCH: 30.7 pg (ref 26.0–34.0)
MCHC: 33.4 g/dL (ref 30.0–36.0)
MCV: 91.9 fL (ref 80.0–100.0)
Platelets: 202 10*3/uL (ref 150–400)
RBC: 3.84 MIL/uL — ABNORMAL LOW (ref 4.22–5.81)
RDW: 14.1 % (ref 11.5–15.5)
WBC: 8 10*3/uL (ref 4.0–10.5)
nRBC: 0 % (ref 0.0–0.2)

## 2022-08-14 LAB — PROTIME-INR
INR: 1.1 (ref 0.8–1.2)
Prothrombin Time: 14.8 seconds (ref 11.4–15.2)

## 2022-08-14 LAB — HEPARIN LEVEL (UNFRACTIONATED): Heparin Unfractionated: 0.38 IU/mL (ref 0.30–0.70)

## 2022-08-14 MED ORDER — OXYCODONE HCL 5 MG PO TABS: 5.0000 mg | ORAL_TABLET | Freq: Four times a day (QID) | ORAL | 0 refills | Status: AC | PRN

## 2022-08-14 MED ORDER — WARFARIN SODIUM 3 MG PO TABS
3.0000 mg | ORAL_TABLET | Freq: Once | ORAL | Status: AC
  Administered 2022-08-14: 3 mg via ORAL
  Filled 2022-08-14: qty 1

## 2022-08-14 MED ORDER — TIZANIDINE HCL 4 MG PO TABS: 4.0000 mg | ORAL_TABLET | Freq: Four times a day (QID) | ORAL | 0 refills | Status: AC | PRN

## 2022-08-14 NOTE — Progress Notes (Signed)
ANTICOAGULATION CONSULT NOTE - follow-up  Pharmacy Consult for heparin >> warfarin with heparin bridge Indication:  hx PE  Allergies  Allergen Reactions   Blood-Group Specific Substance     Must be Leukocyte Reduced and Irradiated due to stem cell transplant.    Prednisone Other (See Comments)    Agitation  Anger    Patient Measurements: Height: 5' 11.5" (181.6 cm) Weight: 88.9 kg (196 lb) IBW/kg (Calculated) : 76.45  Vital Signs: Temp: 98.7 F (37.1 C) (06/11 0815) Temp Source: Oral (06/11 0815) BP: 113/94 (06/11 0815) Pulse Rate: 58 (06/11 0815)  Labs: Recent Labs    08/12/22 0526 08/13/22 0722 08/13/22 1817 08/14/22 0450  HGB 11.5* 12.0*  --  11.8*  HCT 35.4* 36.1*  --  35.3*  PLT 173 204  --  202  LABPROT 14.6 15.1  --  14.8  INR 1.1 1.2  --  1.1  HEPARINUNFRC 0.44 0.34 0.32 0.38     Estimated Creatinine Clearance: 61.1 mL/min (by C-G formula based on SCr of 1.06 mg/dL).   Medical History: Past Medical History:  Diagnosis Date   Anemia 02-16-11   01-05-11- post surgery-Transfusions x 4 units   Arthritis    Bone pain 02/18/2013   Cellulitis    Clotting disorder (HCC)    Complication of anesthesia 02-16-11   Pt. speaks of awakening during the surgery from back  surgery   Degenerative disc disease 02-16-11   01-05-11 L3 fusion done   Esophageal reflux 05/31/2008   Qualifier: Diagnosis of  By: Lovell Sheehan MD, Balinda Quails    Hernia 02-16-11   left inguinal hernia at present   Hypothyroidism 02/01/2014   Multiple myeloma 02-16-11   suspected tumor  left sacral   Pancreatitis    Personal history of traumatic fracture 07/10/2012   Prostate cancer [facility])    Pulmonary embolism (HCC)    Second degree atrioventricular block 11/26/2012   Shortness of breath 02-16-11   hx. Pulmonary emboli x2 (9 yrs/ 3'12 -last)    Assessment: 40 yoM admitted for spinal surgery 6/5. Pt on warfarin PTA for hx PE. Pharmacy originally consulted to dose heparin preop/postop and now  transitioning back to warfarin. Per discussion with neurosurgery, will continue heparin today and discontinue prior to discharge (likely tomorrow) or whenever INR is therapeutic, whichever comes first.   PTA warfarin regimen is 2.5mg  daily  6/11 AM update: HL: 0.38, trending up INR 1.1 No signs of bleeding or issues with the gtt reported  Goal: Heparin level 0.3-0.7 INR  ~1.5 given recent NSGY and history of nose bleeds  Plan:  Continue heparin gtt at 1750 units/hr Give coumadin 3 mg po x1 tonight Monitor CBC, heparin level, INR daily Monitor for s/sx of bleeding    Loralee Pacas, PharmD, BCPS 08/14/2022 9:50 AM  Please check AMION for all [facility] Pharmacy phone numbers After 10:00 PM, call Main Pharmacy [phone]

## 2022-08-14 NOTE — Progress Notes (Signed)
Physical Therapy Treatment Patient Details Name: Rodney [name] MRN: [id number] DOB: 09-07-1940 Today's Date: 08/14/2022   History of Present Illness Pt is a 82 y.o. male who presented 08/07/22 for heparin treatment pre and post op. S/p L4-5 PLIF, laminectomy L4, spire plate placement L3-5 on 6/5. PMH: former smoker (1970), multiple myeloma, s/p chemoradiation, autologous stem cell transplant 11/16/11, pancreatitis, 2nd degree AV block, prostate CA, spinal surgeries, cholecystectomy 2012, anemia, clotting disorder, hypothyroidism, PE    PT Comments    Pt was able to recall all 3/3 spinal precautions with extra time but only min cues today, but still needs intermittent reminders for compliance with functional mobility. Pt exited the R side of the bed today to simulate his home set-up, but had more difficulty due to his R shoulder pain and deficits and thus needed extra time. Pt also had more difficulty powering up to stand from EOB to the RW, needing cues for hand placement and x3 attempts before he was successful. However, pt did demonstrate improved carryover of cues to improve his proximity to the RW and for bil feet clearance and stride length when ambulating with the RW today, no LOB and min guard for safety. When pt tried to ambulate without UE support he did have x1 LOB when he was turning in which he required modA to recover, otherwise min guard for safety as well. Pt agreeable to utilizing his RW at home and verbalized understanding to have his wife guard him with standing mobility due to his high risk for falls at this time. Will continue to follow acutely.      Recommendations for follow up therapy are one component of a multi-disciplinary discharge planning process, led by the attending physician.  Recommendations may be updated based on patient status, additional functional criteria and insurance authorization.  Follow Up Recommendations       Assistance Recommended at Discharge  Intermittent Supervision/Assistance  Patient can return home with the following A little help with walking and/or transfers;A little help with bathing/dressing/bathroom;Assistance with cooking/housework;Help with stairs or ramp for entrance;Assist for transportation   Equipment Recommendations  None recommended by PT (pt declining DME)    Recommendations for Other Services       Precautions / Restrictions Precautions Precautions: Fall;Back Precaution Booklet Issued: Yes (comment) Precaution Comments: reviewed precautions, recalls 3/3 with min cues but extra time - needs cues for compliance intermittently Required Braces or Orthoses:  (no brace needed orders) Restrictions Weight Bearing Restrictions: No     Mobility  Bed Mobility Overal bed mobility: Needs Assistance Bed Mobility: Rolling, Sidelying to Sit Rolling: Min guard Sidelying to sit: Min guard       General bed mobility comments: Bed flat and no use of rails to simulate home set-up. Pt needs extra time and the RW at the level of his head in bed to simulate his bedside table at home pull on to ascend his trunk to sit R EOB due to R shoulder issues impairing his ability to push up on the bed to sit up.    Transfers Overall transfer level: Needs assistance Equipment used: Rolling walker (2 wheels) Transfers: Sit to/from Stand Sit to Stand: Min guard           General transfer comment: Pt pulling up on RW initially and unable to stand, needing cues to place his hands on the bed to push up to stand. x2 attempts before successful in this manner, extra time and effort to stand and extend trunk today,  min guard for safety    Ambulation/Gait Ambulation/Gait assistance: Min guard, Mod assist Gait Distance (Feet): 350 Feet Assistive device: None, Rolling walker (2 wheels) Gait Pattern/deviations: Step-through pattern, Decreased stride length, Decreased dorsiflexion - right, Trunk flexed, Shuffle Gait velocity:  reduced Gait velocity interpretation: <1.8 ft/sec, indicate of risk for recurrent falls   General Gait Details: Pt continues to ambulate with a flexed posture, reportedly baseline, and decreased R ankle dorsiflexion and foot clearance with swing phase. Pt continues to shuffle bil feet, needing cues to increased stride length and bil foot clearance with good success noted. Min guard and no LOB when using the RW. Needs intermittent cues for proximity to RW, but good carryover noted. When ambulating without UE support he displayed smaller steps again and trunk sway, min guard for safety but x1 LOB when turning he needed modA to recover.   Stairs             Wheelchair Mobility    Modified Rankin (Stroke Patients Only)       Balance Overall balance assessment: Needs assistance Sitting-balance support: No upper extremity supported, Feet supported Sitting balance-Leahy Scale: Fair     Standing balance support: No upper extremity supported, During functional activity, Bilateral upper extremity supported Standing balance-Leahy Scale: Fair Standing balance comment: Able to ambulate without UE support, but x1 LOB needing modA to recover, benefits from RW at this time                            Cognition Arousal/Alertness: Awake/alert Behavior During Therapy: [facility] for tasks assessed/performed Overall Cognitive Status: Within Functional Limits for tasks assessed                                 General Comments: Pt recalls 3/3 spinal precautions with min cues and extra time today. Pt mildly less talkative today.        Exercises      General Comments General comments (skin integrity, edema, etc.): continued to educate pt on his risk for falls and recs to use his RW at home. He verbalized understanding and stated he would use his RW unless his wrists are hurting him. Again educated pt to have his wife guard him with mobility      Pertinent Vitals/Pain Pain  Assessment Pain Assessment: Faces Faces Pain Scale: Hurts even more Pain Location: back, R shoulder Pain Descriptors / Indicators: Discomfort, Grimacing, Operative site guarding Pain Intervention(s): Limited activity within patient's tolerance, Monitored during session, Repositioned    Home Living                          Prior Function            PT Goals (current goals can now be found in the care plan section) Acute Rehab PT Goals Patient Stated Goal: to go home and reduce pain PT Goal Formulation: With patient Time For Goal Achievement: 08/23/22 Potential to Achieve Goals: Good Progress towards PT goals: Progressing toward goals    Frequency    Min 5X/week      PT Plan Current plan remains appropriate    Co-evaluation              AM-PAC PT "6 Clicks" Mobility   Outcome Measure  Help needed turning from your back to your side while in a flat bed without  using bedrails?: A Little Help needed moving from lying on your back to sitting on the side of a flat bed without using bedrails?: A Little Help needed moving to and from a bed to a chair (including a wheelchair)?: A Little Help needed standing up from a chair using your arms (e.g., wheelchair or bedside chair)?: A Little Help needed to walk in hospital room?: A Lot Help needed climbing 3-5 steps with a railing? : A Little 6 Click Score: 17    End of Session Equipment Utilized During Treatment: Gait belt Activity Tolerance: Patient tolerated treatment well Patient left: in chair;with call bell/phone within reach;with chair alarm set   PT Visit Diagnosis: Unsteadiness on feet (R26.81);Other abnormalities of gait and mobility (R26.89);Muscle weakness (generalized) (M62.81);Difficulty in walking, not elsewhere classified (R26.2);Pain Pain - Right/Left:  (back) Pain - part of body:  (back)     Time: 1610-9604 PT Time Calculation (min) (ACUTE ONLY): 24 min  Charges:  $Gait Training: 8-22  mins $Therapeutic Activity: 8-22 mins                     Raymond Gurney, PT, DPT Acute Rehabilitation Services  Office: [phone]    Jewel Baize 08/14/2022, 11:41 AM

## 2022-08-14 NOTE — Discharge Instructions (Signed)

## 2022-08-14 NOTE — Progress Notes (Addendum)
Patient ID: Rodney [name], male   DOB: April 01, 1940, 82 y.o.   MRN: [id number] BP 114/75 (BP Location: Left Arm)   Pulse 66   Temp 98.7 F (37.1 C) (Oral)   Resp 18   Ht 5' 11.5" (1.816 m)   Wt 88.9 kg   SpO2 99%   BMI 26.96 kg/m  Alert and oriented x 4 Needs significant assistance when using walker.  Believe home health PT/OT would be helpful Will hold discharge. Will discontinue heparin Wound is clean, dry, no signs of infection

## 2022-08-15 LAB — CBC
HCT: 33.5 % — ABNORMAL LOW (ref 39.0–52.0)
Hemoglobin: 11.1 g/dL — ABNORMAL LOW (ref 13.0–17.0)
MCH: 30.4 pg (ref 26.0–34.0)
MCHC: 33.1 g/dL (ref 30.0–36.0)
MCV: 91.8 fL (ref 80.0–100.0)
Platelets: 193 10*3/uL (ref 150–400)
RBC: 3.65 MIL/uL — ABNORMAL LOW (ref 4.22–5.81)
RDW: 14 % (ref 11.5–15.5)
WBC: 6.9 10*3/uL (ref 4.0–10.5)
nRBC: 0 % (ref 0.0–0.2)

## 2022-08-15 LAB — PROTIME-INR
INR: 1.1 (ref 0.8–1.2)
Prothrombin Time: 14.7 seconds (ref 11.4–15.2)

## 2022-08-15 NOTE — TOC Transition Note (Signed)
Transition of Care [facility]) - CM/SW Discharge Note   Patient Details  Name: Rodney [name] MRN: [id number] Date of Birth: August 06, 1940  [facility] CM/SW Contact:  Kermit Balo, RN Phone Number: 08/15/2022, 11:28 AM   Clinical Narrative:    Pt is discharging home with home health services through [facility]. Pt has decided he needs HH at home and agreeable. Pt has used [facility] in the past and would like to use them again. Information on the AVS. BSC for home in the room.  Wife to provide transportation home.   Final next level of care: Home w Home Health Services Barriers to Discharge: No Barriers Identified   Patient Goals and CMS Choice CMS Medicare.gov Compare Post Acute Care list provided to:: Patient Represenative (must comment) Choice offered to / list presented to : Spouse, Patient  Discharge Placement                         Discharge Plan and Services Additional resources added to the After Visit Summary for     Discharge Planning Services: CM Consult            DME Arranged: Bedside commode DME Agency: [employer] Date DME Agency Contacted: 08/09/22   Representative spoke with at DME Agency: Marthann Schiller HH Arranged: PT, OT HH Agency: [facility] Date [facility] Agency Contacted: 08/15/22   Representative spoke with at [facility] Agency: Haywood Lasso  Social Determinants of Health (SDOH) Interventions SDOH Screenings   Food Insecurity: No Food Insecurity (08/07/2022)  Housing: Low Risk  (08/07/2022)  Transportation Needs: No Transportation Needs (08/07/2022)  Utilities: Not At Risk (08/07/2022)  Depression (PHQ2-9): Low Risk  (09/26/2020)  Tobacco Use: Medium Risk (08/08/2022)     Readmission Risk Interventions     No data to display

## 2022-08-15 NOTE — Progress Notes (Signed)
ANTICOAGULATION CONSULT NOTE - follow-up  Pharmacy Consult for warfarin   Allergies  Allergen Reactions   Blood-Group Specific Substance     Must be Leukocyte Reduced and Irradiated due to stem cell transplant.    Prednisone Other (See Comments)    Agitation  Anger    Patient Measurements: Height: 5' 11.5" (181.6 cm) Weight: 88.9 kg (196 lb) IBW/kg (Calculated) : 76.45  Vital Signs: Temp: 98.4 F (36.9 C) (06/12 0804) Temp Source: Oral (06/12 0749) BP: 127/73 (06/12 0804) Pulse Rate: 89 (06/12 0804)  Labs: Recent Labs    08/13/22 0722 08/13/22 1817 08/14/22 0450 08/15/22 0250  HGB 12.0*  --  11.8* 11.1*  HCT 36.1*  --  35.3* 33.5*  PLT 204  --  202 193  LABPROT 15.1  --  14.8 14.7  INR 1.2  --  1.1 1.1  HEPARINUNFRC 0.34 0.32 0.38  --      Estimated Creatinine Clearance: 61.1 mL/min (by C-G formula based on SCr of 1.06 mg/dL).   Medical History: Past Medical History:  Diagnosis Date   Anemia 02-16-11   01-05-11- post surgery-Transfusions x 4 units   Arthritis    Bone pain 02/18/2013   Cellulitis    Clotting disorder (HCC)    Complication of anesthesia 02-16-11   Pt. speaks of awakening during the surgery from back  surgery   Degenerative disc disease 02-16-11   01-05-11 L3 fusion done   Esophageal reflux 05/31/2008   Qualifier: Diagnosis of  By: Lovell Sheehan MD, Balinda Quails    Hernia 02-16-11   left inguinal hernia at present   Hypothyroidism 02/01/2014   Multiple myeloma 02-16-11   suspected tumor  left sacral   Pancreatitis    Personal history of traumatic fracture 07/10/2012   Prostate cancer [facility])    Pulmonary embolism (HCC)    Second degree atrioventricular block 11/26/2012   Shortness of breath 02-16-11   hx. Pulmonary emboli x2 (9 yrs/ 3'12 -last)    Assessment: 35 yoM admitted for spinal surgery 6/5. Pt on warfarin PTA for hx PE. Pharmacy originally consulted to dose heparin preop/postop and now transitioning back to warfarin. Per discussion with  neurosurgery, will continue heparin today and discontinue prior to discharge (likely tomorrow) or whenever INR is therapeutic, whichever comes first.   PTA warfarin regimen is 2.5mg  daily  6/12 AM update: INR 1.1   Goal: INR  ~1.5 given recent NSGY and history of nose bleeds  Plan:  Patient is discharging today Place on home dose of coumadin 2.5 mg po daily.  Follow-up w/ OP provider for further coumadin management    Greta Doom BS, PharmD, BCPS Clinical Pharmacist 08/15/2022 11:04 AM  Contact: [phone] after 3 PM  "Be curious, not judgmental..." -Debbora Dus

## 2022-08-15 NOTE — Progress Notes (Signed)
Physical Therapy Treatment Patient Details Name: Rodney [name] MRN: [id number] DOB: 01/07/1941 Today's Date: 08/15/2022   History of Present Illness Pt is a 82 y.o. male who presented 08/07/22 for heparin treatment pre and post op. S/p L4-5 PLIF, laminectomy L4, spire plate placement L3-5 on 6/5. PMH: former smoker (1970), multiple myeloma, s/p chemoradiation, autologous stem cell transplant 11/16/11, pancreatitis, 2nd degree AV block, prostate CA, spinal surgeries, cholecystectomy 2012, anemia, clotting disorder, hypothyroidism, PE    PT Comments    Pt progressing towards his physical therapy goals, requiring less assist overall for functional mobility. Pt ambulating 200 ft with a walker at a min guard assist level. Pt spouse present and reviewed guarding technique while mobilizing. Also visually demonstrated car transfer technique. Will benefit from HHPT at d/c to address deficits and maximize functional mobility.    Recommendations for follow up therapy are one component of a multi-disciplinary discharge planning process, led by the attending physician.  Recommendations may be updated based on patient status, additional functional criteria and insurance authorization.  Follow Up Recommendations       Assistance Recommended at Discharge Intermittent Supervision/Assistance  Patient can return home with the following A little help with walking and/or transfers;A little help with bathing/dressing/bathroom;Assistance with cooking/housework;Help with stairs or ramp for entrance;Assist for transportation   Equipment Recommendations  None recommended by PT    Recommendations for Other Services       Precautions / Restrictions Precautions Precautions: Fall;Back Precaution Booklet Issued: Yes (comment) Precaution Comments: reviewed precautions, recalls 3/3 with min cues but extra time - needs cues for compliance intermittently Required Braces or Orthoses:  (no brace needed  orders) Restrictions Weight Bearing Restrictions: No     Mobility  Bed Mobility Overal bed mobility: Needs Assistance Bed Mobility: Rolling, Sidelying to Sit Rolling: Supervision Sidelying to sit: Supervision       General bed mobility comments: Bed flat, exiting towards left side of bed, increased time/effort    Transfers Overall transfer level: Needs assistance Equipment used: Rolling walker (2 wheels) Transfers: Sit to/from Stand Sit to Stand: Supervision           General transfer comment: Cues for hand placement    Ambulation/Gait Ambulation/Gait assistance: Min guard Gait Distance (Feet): 200 Feet Assistive device: Rolling walker (2 wheels) Gait Pattern/deviations: Step-through pattern, Decreased stride length, Decreased dorsiflexion - right, Trunk flexed, Shuffle Gait velocity: reduced     General Gait Details: Verbal cueing for walker proximity, upright posture, keeping feet on inside of walker during turns. Min guard for Futures trader    Modified Rankin (Stroke Patients Only)       Balance Overall balance assessment: Needs assistance Sitting-balance support: No upper extremity supported, Feet supported Sitting balance-Leahy Scale: Fair     Standing balance support: During functional activity, Single extremity supported Standing balance-Leahy Scale: Poor Standing balance comment: reliant on at least single UE support                            Cognition Arousal/Alertness: Awake/alert Behavior During Therapy: WFL for tasks assessed/performed Overall Cognitive Status: Within Functional Limits for tasks assessed                                          Exercises  General Comments        Pertinent Vitals/Pain Pain Assessment Pain Assessment: Faces Faces Pain Scale: Hurts a little bit Pain Location: back, bilat shoulders (R>L) Pain Descriptors / Indicators:  Discomfort, Grimacing, Operative site guarding Pain Intervention(s): Monitored during session    Home Living                          Prior Function            PT Goals (current goals can now be found in the care plan section) Acute Rehab PT Goals Patient Stated Goal: to go home and reduce pain PT Goal Formulation: With patient Time For Goal Achievement: 08/23/22 Potential to Achieve Goals: Good Progress towards PT goals: Progressing toward goals    Frequency    Min 5X/week      PT Plan Current plan remains appropriate    Co-evaluation              AM-PAC PT "6 Clicks" Mobility   Outcome Measure  Help needed turning from your back to your side while in a flat bed without using bedrails?: A Little Help needed moving from lying on your back to sitting on the side of a flat bed without using bedrails?: A Little Help needed moving to and from a bed to a chair (including a wheelchair)?: A Little Help needed standing up from a chair using your arms (e.g., wheelchair or bedside chair)?: A Little Help needed to walk in hospital room?: A Little Help needed climbing 3-5 steps with a railing? : A Little 6 Click Score: 18    End of Session Equipment Utilized During Treatment: Gait belt Activity Tolerance: Patient tolerated treatment well Patient left: with call bell/phone within reach;in bed;with bed alarm set;with family/visitor present Nurse Communication: Mobility status PT Visit Diagnosis: Unsteadiness on feet (R26.81);Other abnormalities of gait and mobility (R26.89);Muscle weakness (generalized) (M62.81);Difficulty in walking, not elsewhere classified (R26.2);Pain Pain - Right/Left:  (back) Pain - part of body:  (back)     Time: 1914-7829 PT Time Calculation (min) (ACUTE ONLY): 32 min  Charges:  $Gait Training: 8-22 mins $Therapeutic Activity: 8-22 mins                     Lillia Pauls, PT, DPT Acute Rehabilitation Services Office  [phone]    Rodney [name] 08/15/2022, 11:30 AM

## 2022-08-15 NOTE — Progress Notes (Signed)
Pt and his equipment wheeled off unit by 2 volunteer. This RN confirmed DC paperwork was reviewed by Flow RN. education was fully understood by pt and his wife, confirmed by pt and pts wife.

## 2022-08-16 ENCOUNTER — Telehealth: Payer: Self-pay | Admitting: Dermatology

## 2022-08-16 DIAGNOSIS — Z7901 Long term (current) use of anticoagulants: Secondary | ICD-10-CM | POA: Diagnosis not present

## 2022-08-16 NOTE — Telephone Encounter (Signed)
Pt is a former pt of [facility] & would like to come back. States spouse & daughter are still current pts. Please advise

## 2022-08-16 NOTE — Telephone Encounter (Signed)
Reference phone note 10/17/20- once again "Based on last interaction with our team/staff I am not willing to accept him again"  " Based on last interaction with our team/staff I am not willing to accept him again   "Peace, Tammy   TP   10:05 AM Note FYI:   Patient came in this morning to see [facility].    I informed patient that Arline Asp was not in our office today.  I pulled patients appointment desk to see if patient had a standing appointment.  Patient did not.    Patient wanted to know where Arline Asp was today.  I began to pull Cindy's office locations.  Patient got aggravated pretty quickly, turned around and walked out stating he would go to one of the other office locations b/c I was too slow.     Patient later called back asking if there was anywhere he could get his blood checked today.  Before I could respond patient stated he was at [facility] and that they were taking him back.  Then stated he was going to find another provider and thanked me for my assistance and laughed.          "

## 2022-08-17 NOTE — Telephone Encounter (Signed)
I have scheduled patient to establish care with Dr. Jon Billings.  I have advised that negative behavior could lead to being discharged from all [facility] practices.

## 2022-08-17 NOTE — Telephone Encounter (Signed)
See below, Dr. Stann Mainland NOT accept this pt back.

## 2022-08-20 DIAGNOSIS — Z86711 Personal history of pulmonary embolism: Secondary | ICD-10-CM | POA: Diagnosis not present

## 2022-08-20 DIAGNOSIS — Z8579 Personal history of other malignant neoplasms of lymphoid, hematopoietic and related tissues: Secondary | ICD-10-CM | POA: Diagnosis not present

## 2022-08-20 DIAGNOSIS — Z981 Arthrodesis status: Secondary | ICD-10-CM | POA: Diagnosis not present

## 2022-08-20 DIAGNOSIS — G8929 Other chronic pain: Secondary | ICD-10-CM | POA: Diagnosis not present

## 2022-08-20 DIAGNOSIS — M4316 Spondylolisthesis, lumbar region: Secondary | ICD-10-CM | POA: Diagnosis not present

## 2022-08-20 DIAGNOSIS — Z85828 Personal history of other malignant neoplasm of skin: Secondary | ICD-10-CM | POA: Diagnosis not present

## 2022-08-20 DIAGNOSIS — Z4789 Encounter for other orthopedic aftercare: Secondary | ICD-10-CM | POA: Diagnosis not present

## 2022-08-20 DIAGNOSIS — M160 Bilateral primary osteoarthritis of hip: Secondary | ICD-10-CM | POA: Diagnosis not present

## 2022-08-20 DIAGNOSIS — M5136 Other intervertebral disc degeneration, lumbar region: Secondary | ICD-10-CM | POA: Diagnosis not present

## 2022-08-20 DIAGNOSIS — E559 Vitamin D deficiency, unspecified: Secondary | ICD-10-CM | POA: Diagnosis not present

## 2022-08-20 DIAGNOSIS — Z87891 Personal history of nicotine dependence: Secondary | ICD-10-CM | POA: Diagnosis not present

## 2022-08-20 DIAGNOSIS — Z9484 Stem cells transplant status: Secondary | ICD-10-CM | POA: Diagnosis not present

## 2022-08-20 DIAGNOSIS — M5441 Lumbago with sciatica, right side: Secondary | ICD-10-CM | POA: Diagnosis not present

## 2022-08-20 DIAGNOSIS — M48061 Spinal stenosis, lumbar region without neurogenic claudication: Secondary | ICD-10-CM | POA: Diagnosis not present

## 2022-08-20 DIAGNOSIS — M47816 Spondylosis without myelopathy or radiculopathy, lumbar region: Secondary | ICD-10-CM | POA: Diagnosis not present

## 2022-08-20 DIAGNOSIS — G5603 Carpal tunnel syndrome, bilateral upper limbs: Secondary | ICD-10-CM | POA: Diagnosis not present

## 2022-08-20 DIAGNOSIS — Z7901 Long term (current) use of anticoagulants: Secondary | ICD-10-CM | POA: Diagnosis not present

## 2022-08-20 DIAGNOSIS — E039 Hypothyroidism, unspecified: Secondary | ICD-10-CM | POA: Diagnosis not present

## 2022-08-20 DIAGNOSIS — J309 Allergic rhinitis, unspecified: Secondary | ICD-10-CM | POA: Diagnosis not present

## 2022-08-20 DIAGNOSIS — E785 Hyperlipidemia, unspecified: Secondary | ICD-10-CM | POA: Diagnosis not present

## 2022-08-20 DIAGNOSIS — N3281 Overactive bladder: Secondary | ICD-10-CM | POA: Diagnosis not present

## 2022-08-20 DIAGNOSIS — Z79891 Long term (current) use of opiate analgesic: Secondary | ICD-10-CM | POA: Diagnosis not present

## 2022-08-20 NOTE — Anesthesia Postprocedure Evaluation (Signed)
Anesthesia Post Note  Patient: Rodney [name]  Procedure(s) Performed: Lumbar Four-Five Posterior Lumbar Interbody Fusion     Patient location during evaluation: PACU Anesthesia Type: General Level of consciousness: awake and alert Pain management: pain level controlled Vital Signs Assessment: post-procedure vital signs reviewed and stable Respiratory status: spontaneous breathing, nonlabored ventilation, respiratory function stable and patient connected to nasal cannula oxygen Cardiovascular status: blood pressure returned to baseline and stable Postop Assessment: no apparent nausea or vomiting Anesthetic complications: no  There were no known notable events for this encounter.  Last Vitals:  Vitals:   08/15/22 0749 08/15/22 0804  BP: 113/70 127/73  Pulse: 62 89  Resp: 19   Temp: 36.8 C 36.9 C  SpO2: 92% 98%    Last Pain:  Vitals:   08/15/22 1200  TempSrc:   PainSc: 0-No pain                 Kyarah Enamorado S

## 2022-08-22 DIAGNOSIS — Z4789 Encounter for other orthopedic aftercare: Secondary | ICD-10-CM | POA: Diagnosis not present

## 2022-08-22 DIAGNOSIS — E559 Vitamin D deficiency, unspecified: Secondary | ICD-10-CM | POA: Diagnosis not present

## 2022-08-22 DIAGNOSIS — Z86711 Personal history of pulmonary embolism: Secondary | ICD-10-CM | POA: Diagnosis not present

## 2022-08-22 DIAGNOSIS — Z981 Arthrodesis status: Secondary | ICD-10-CM | POA: Diagnosis not present

## 2022-08-22 DIAGNOSIS — J309 Allergic rhinitis, unspecified: Secondary | ICD-10-CM | POA: Diagnosis not present

## 2022-08-22 DIAGNOSIS — M5441 Lumbago with sciatica, right side: Secondary | ICD-10-CM | POA: Diagnosis not present

## 2022-08-22 DIAGNOSIS — M48061 Spinal stenosis, lumbar region without neurogenic claudication: Secondary | ICD-10-CM | POA: Diagnosis not present

## 2022-08-22 DIAGNOSIS — G8929 Other chronic pain: Secondary | ICD-10-CM | POA: Diagnosis not present

## 2022-08-22 DIAGNOSIS — M5136 Other intervertebral disc degeneration, lumbar region: Secondary | ICD-10-CM | POA: Diagnosis not present

## 2022-08-22 DIAGNOSIS — M47816 Spondylosis without myelopathy or radiculopathy, lumbar region: Secondary | ICD-10-CM | POA: Diagnosis not present

## 2022-08-22 DIAGNOSIS — N3281 Overactive bladder: Secondary | ICD-10-CM | POA: Diagnosis not present

## 2022-08-22 DIAGNOSIS — Z87891 Personal history of nicotine dependence: Secondary | ICD-10-CM | POA: Diagnosis not present

## 2022-08-22 DIAGNOSIS — Z85828 Personal history of other malignant neoplasm of skin: Secondary | ICD-10-CM | POA: Diagnosis not present

## 2022-08-22 DIAGNOSIS — Z7901 Long term (current) use of anticoagulants: Secondary | ICD-10-CM | POA: Diagnosis not present

## 2022-08-22 DIAGNOSIS — M160 Bilateral primary osteoarthritis of hip: Secondary | ICD-10-CM | POA: Diagnosis not present

## 2022-08-22 DIAGNOSIS — Z79891 Long term (current) use of opiate analgesic: Secondary | ICD-10-CM | POA: Diagnosis not present

## 2022-08-22 DIAGNOSIS — G5603 Carpal tunnel syndrome, bilateral upper limbs: Secondary | ICD-10-CM | POA: Diagnosis not present

## 2022-08-22 DIAGNOSIS — M4316 Spondylolisthesis, lumbar region: Secondary | ICD-10-CM | POA: Diagnosis not present

## 2022-08-22 DIAGNOSIS — E785 Hyperlipidemia, unspecified: Secondary | ICD-10-CM | POA: Diagnosis not present

## 2022-08-22 DIAGNOSIS — E039 Hypothyroidism, unspecified: Secondary | ICD-10-CM | POA: Diagnosis not present

## 2022-08-22 DIAGNOSIS — Z9484 Stem cells transplant status: Secondary | ICD-10-CM | POA: Diagnosis not present

## 2022-08-22 DIAGNOSIS — Z8579 Personal history of other malignant neoplasms of lymphoid, hematopoietic and related tissues: Secondary | ICD-10-CM | POA: Diagnosis not present

## 2022-08-24 DIAGNOSIS — D689 Coagulation defect, unspecified: Secondary | ICD-10-CM | POA: Diagnosis not present

## 2022-08-24 DIAGNOSIS — D6869 Other thrombophilia: Secondary | ICD-10-CM | POA: Diagnosis not present

## 2022-08-24 DIAGNOSIS — Z7901 Long term (current) use of anticoagulants: Secondary | ICD-10-CM | POA: Diagnosis not present

## 2022-08-24 DIAGNOSIS — Z7689 Persons encountering health services in other specified circumstances: Secondary | ICD-10-CM | POA: Diagnosis not present

## 2022-08-27 ENCOUNTER — Other Ambulatory Visit: Payer: Self-pay | Admitting: Neurosurgery

## 2022-08-27 DIAGNOSIS — M4316 Spondylolisthesis, lumbar region: Secondary | ICD-10-CM | POA: Diagnosis not present

## 2022-08-27 DIAGNOSIS — G5601 Carpal tunnel syndrome, right upper limb: Secondary | ICD-10-CM | POA: Diagnosis not present

## 2022-08-28 DIAGNOSIS — G5603 Carpal tunnel syndrome, bilateral upper limbs: Secondary | ICD-10-CM | POA: Diagnosis not present

## 2022-08-28 DIAGNOSIS — E785 Hyperlipidemia, unspecified: Secondary | ICD-10-CM | POA: Diagnosis not present

## 2022-08-28 DIAGNOSIS — M47816 Spondylosis without myelopathy or radiculopathy, lumbar region: Secondary | ICD-10-CM | POA: Diagnosis not present

## 2022-08-28 DIAGNOSIS — M4316 Spondylolisthesis, lumbar region: Secondary | ICD-10-CM | POA: Diagnosis not present

## 2022-08-28 DIAGNOSIS — E559 Vitamin D deficiency, unspecified: Secondary | ICD-10-CM | POA: Diagnosis not present

## 2022-08-28 DIAGNOSIS — M5136 Other intervertebral disc degeneration, lumbar region: Secondary | ICD-10-CM | POA: Diagnosis not present

## 2022-08-28 DIAGNOSIS — Z86711 Personal history of pulmonary embolism: Secondary | ICD-10-CM | POA: Diagnosis not present

## 2022-08-28 DIAGNOSIS — Z87891 Personal history of nicotine dependence: Secondary | ICD-10-CM | POA: Diagnosis not present

## 2022-08-28 DIAGNOSIS — G8929 Other chronic pain: Secondary | ICD-10-CM | POA: Diagnosis not present

## 2022-08-28 DIAGNOSIS — Z79891 Long term (current) use of opiate analgesic: Secondary | ICD-10-CM | POA: Diagnosis not present

## 2022-08-28 DIAGNOSIS — Z981 Arthrodesis status: Secondary | ICD-10-CM | POA: Diagnosis not present

## 2022-08-28 DIAGNOSIS — Z85828 Personal history of other malignant neoplasm of skin: Secondary | ICD-10-CM | POA: Diagnosis not present

## 2022-08-28 DIAGNOSIS — E039 Hypothyroidism, unspecified: Secondary | ICD-10-CM | POA: Diagnosis not present

## 2022-08-28 DIAGNOSIS — Z7901 Long term (current) use of anticoagulants: Secondary | ICD-10-CM | POA: Diagnosis not present

## 2022-08-28 DIAGNOSIS — Z4789 Encounter for other orthopedic aftercare: Secondary | ICD-10-CM | POA: Diagnosis not present

## 2022-08-28 DIAGNOSIS — M48061 Spinal stenosis, lumbar region without neurogenic claudication: Secondary | ICD-10-CM | POA: Diagnosis not present

## 2022-08-28 DIAGNOSIS — Z9484 Stem cells transplant status: Secondary | ICD-10-CM | POA: Diagnosis not present

## 2022-08-28 DIAGNOSIS — M160 Bilateral primary osteoarthritis of hip: Secondary | ICD-10-CM | POA: Diagnosis not present

## 2022-08-28 DIAGNOSIS — N3281 Overactive bladder: Secondary | ICD-10-CM | POA: Diagnosis not present

## 2022-08-28 DIAGNOSIS — M5441 Lumbago with sciatica, right side: Secondary | ICD-10-CM | POA: Diagnosis not present

## 2022-08-28 DIAGNOSIS — J309 Allergic rhinitis, unspecified: Secondary | ICD-10-CM | POA: Diagnosis not present

## 2022-08-28 DIAGNOSIS — Z8579 Personal history of other malignant neoplasms of lymphoid, hematopoietic and related tissues: Secondary | ICD-10-CM | POA: Diagnosis not present

## 2022-08-29 DIAGNOSIS — M4316 Spondylolisthesis, lumbar region: Secondary | ICD-10-CM | POA: Diagnosis not present

## 2022-08-29 DIAGNOSIS — Z7901 Long term (current) use of anticoagulants: Secondary | ICD-10-CM | POA: Diagnosis not present

## 2022-08-29 DIAGNOSIS — G5603 Carpal tunnel syndrome, bilateral upper limbs: Secondary | ICD-10-CM | POA: Diagnosis not present

## 2022-08-29 DIAGNOSIS — N3281 Overactive bladder: Secondary | ICD-10-CM | POA: Diagnosis not present

## 2022-08-29 DIAGNOSIS — Z981 Arthrodesis status: Secondary | ICD-10-CM | POA: Diagnosis not present

## 2022-08-29 DIAGNOSIS — G8929 Other chronic pain: Secondary | ICD-10-CM | POA: Diagnosis not present

## 2022-08-29 DIAGNOSIS — M160 Bilateral primary osteoarthritis of hip: Secondary | ICD-10-CM | POA: Diagnosis not present

## 2022-08-29 DIAGNOSIS — M5441 Lumbago with sciatica, right side: Secondary | ICD-10-CM | POA: Diagnosis not present

## 2022-08-29 DIAGNOSIS — Z85828 Personal history of other malignant neoplasm of skin: Secondary | ICD-10-CM | POA: Diagnosis not present

## 2022-08-29 DIAGNOSIS — Z87891 Personal history of nicotine dependence: Secondary | ICD-10-CM | POA: Diagnosis not present

## 2022-08-29 DIAGNOSIS — Z86711 Personal history of pulmonary embolism: Secondary | ICD-10-CM | POA: Diagnosis not present

## 2022-08-29 DIAGNOSIS — Z9484 Stem cells transplant status: Secondary | ICD-10-CM | POA: Diagnosis not present

## 2022-08-29 DIAGNOSIS — M5136 Other intervertebral disc degeneration, lumbar region: Secondary | ICD-10-CM | POA: Diagnosis not present

## 2022-08-29 DIAGNOSIS — M48061 Spinal stenosis, lumbar region without neurogenic claudication: Secondary | ICD-10-CM | POA: Diagnosis not present

## 2022-08-29 DIAGNOSIS — Z8579 Personal history of other malignant neoplasms of lymphoid, hematopoietic and related tissues: Secondary | ICD-10-CM | POA: Diagnosis not present

## 2022-08-29 DIAGNOSIS — M47816 Spondylosis without myelopathy or radiculopathy, lumbar region: Secondary | ICD-10-CM | POA: Diagnosis not present

## 2022-08-29 DIAGNOSIS — E785 Hyperlipidemia, unspecified: Secondary | ICD-10-CM | POA: Diagnosis not present

## 2022-08-29 DIAGNOSIS — Z4789 Encounter for other orthopedic aftercare: Secondary | ICD-10-CM | POA: Diagnosis not present

## 2022-08-29 DIAGNOSIS — E559 Vitamin D deficiency, unspecified: Secondary | ICD-10-CM | POA: Diagnosis not present

## 2022-08-29 DIAGNOSIS — J309 Allergic rhinitis, unspecified: Secondary | ICD-10-CM | POA: Diagnosis not present

## 2022-08-29 DIAGNOSIS — Z79891 Long term (current) use of opiate analgesic: Secondary | ICD-10-CM | POA: Diagnosis not present

## 2022-08-29 DIAGNOSIS — E039 Hypothyroidism, unspecified: Secondary | ICD-10-CM | POA: Diagnosis not present

## 2022-08-30 DIAGNOSIS — E785 Hyperlipidemia, unspecified: Secondary | ICD-10-CM | POA: Diagnosis not present

## 2022-08-30 DIAGNOSIS — Z981 Arthrodesis status: Secondary | ICD-10-CM | POA: Diagnosis not present

## 2022-08-30 DIAGNOSIS — M4316 Spondylolisthesis, lumbar region: Secondary | ICD-10-CM | POA: Diagnosis not present

## 2022-08-30 DIAGNOSIS — Z79891 Long term (current) use of opiate analgesic: Secondary | ICD-10-CM | POA: Diagnosis not present

## 2022-08-30 DIAGNOSIS — Z4789 Encounter for other orthopedic aftercare: Secondary | ICD-10-CM | POA: Diagnosis not present

## 2022-08-30 DIAGNOSIS — J309 Allergic rhinitis, unspecified: Secondary | ICD-10-CM | POA: Diagnosis not present

## 2022-08-30 DIAGNOSIS — M47816 Spondylosis without myelopathy or radiculopathy, lumbar region: Secondary | ICD-10-CM | POA: Diagnosis not present

## 2022-08-30 DIAGNOSIS — M160 Bilateral primary osteoarthritis of hip: Secondary | ICD-10-CM | POA: Diagnosis not present

## 2022-08-30 DIAGNOSIS — Z7901 Long term (current) use of anticoagulants: Secondary | ICD-10-CM | POA: Diagnosis not present

## 2022-08-30 DIAGNOSIS — Z87891 Personal history of nicotine dependence: Secondary | ICD-10-CM | POA: Diagnosis not present

## 2022-08-30 DIAGNOSIS — E559 Vitamin D deficiency, unspecified: Secondary | ICD-10-CM | POA: Diagnosis not present

## 2022-08-30 DIAGNOSIS — M5136 Other intervertebral disc degeneration, lumbar region: Secondary | ICD-10-CM | POA: Diagnosis not present

## 2022-08-30 DIAGNOSIS — Z85828 Personal history of other malignant neoplasm of skin: Secondary | ICD-10-CM | POA: Diagnosis not present

## 2022-08-30 DIAGNOSIS — N3281 Overactive bladder: Secondary | ICD-10-CM | POA: Diagnosis not present

## 2022-08-30 DIAGNOSIS — Z86711 Personal history of pulmonary embolism: Secondary | ICD-10-CM | POA: Diagnosis not present

## 2022-08-30 DIAGNOSIS — G8929 Other chronic pain: Secondary | ICD-10-CM | POA: Diagnosis not present

## 2022-08-30 DIAGNOSIS — E039 Hypothyroidism, unspecified: Secondary | ICD-10-CM | POA: Diagnosis not present

## 2022-08-30 DIAGNOSIS — M5441 Lumbago with sciatica, right side: Secondary | ICD-10-CM | POA: Diagnosis not present

## 2022-08-30 DIAGNOSIS — Z9484 Stem cells transplant status: Secondary | ICD-10-CM | POA: Diagnosis not present

## 2022-08-30 DIAGNOSIS — Z8579 Personal history of other malignant neoplasms of lymphoid, hematopoietic and related tissues: Secondary | ICD-10-CM | POA: Diagnosis not present

## 2022-08-30 DIAGNOSIS — M48061 Spinal stenosis, lumbar region without neurogenic claudication: Secondary | ICD-10-CM | POA: Diagnosis not present

## 2022-08-30 DIAGNOSIS — G5603 Carpal tunnel syndrome, bilateral upper limbs: Secondary | ICD-10-CM | POA: Diagnosis not present

## 2022-09-03 ENCOUNTER — Ambulatory Visit
Admission: RE | Admit: 2022-09-03 | Discharge: 2022-09-03 | Disposition: A | Discharge status: Home / Self Care | Payer: Medicare Other | Source: Ambulatory Visit | Attending: Neurosurgery | Admitting: Neurosurgery

## 2022-09-03 DIAGNOSIS — M4316 Spondylolisthesis, lumbar region: Secondary | ICD-10-CM

## 2022-09-04 DIAGNOSIS — J309 Allergic rhinitis, unspecified: Secondary | ICD-10-CM | POA: Diagnosis not present

## 2022-09-04 DIAGNOSIS — M5441 Lumbago with sciatica, right side: Secondary | ICD-10-CM | POA: Diagnosis not present

## 2022-09-04 DIAGNOSIS — N3281 Overactive bladder: Secondary | ICD-10-CM | POA: Diagnosis not present

## 2022-09-04 DIAGNOSIS — Z7901 Long term (current) use of anticoagulants: Secondary | ICD-10-CM | POA: Diagnosis not present

## 2022-09-04 DIAGNOSIS — M48061 Spinal stenosis, lumbar region without neurogenic claudication: Secondary | ICD-10-CM | POA: Diagnosis not present

## 2022-09-04 DIAGNOSIS — G5603 Carpal tunnel syndrome, bilateral upper limbs: Secondary | ICD-10-CM | POA: Diagnosis not present

## 2022-09-04 DIAGNOSIS — E039 Hypothyroidism, unspecified: Secondary | ICD-10-CM | POA: Diagnosis not present

## 2022-09-04 DIAGNOSIS — M160 Bilateral primary osteoarthritis of hip: Secondary | ICD-10-CM | POA: Diagnosis not present

## 2022-09-04 DIAGNOSIS — Z981 Arthrodesis status: Secondary | ICD-10-CM | POA: Diagnosis not present

## 2022-09-04 DIAGNOSIS — M4316 Spondylolisthesis, lumbar region: Secondary | ICD-10-CM | POA: Diagnosis not present

## 2022-09-04 DIAGNOSIS — Z86711 Personal history of pulmonary embolism: Secondary | ICD-10-CM | POA: Diagnosis not present

## 2022-09-04 DIAGNOSIS — M5136 Other intervertebral disc degeneration, lumbar region: Secondary | ICD-10-CM | POA: Diagnosis not present

## 2022-09-04 DIAGNOSIS — Z79891 Long term (current) use of opiate analgesic: Secondary | ICD-10-CM | POA: Diagnosis not present

## 2022-09-04 DIAGNOSIS — M47816 Spondylosis without myelopathy or radiculopathy, lumbar region: Secondary | ICD-10-CM | POA: Diagnosis not present

## 2022-09-04 DIAGNOSIS — Z8579 Personal history of other malignant neoplasms of lymphoid, hematopoietic and related tissues: Secondary | ICD-10-CM | POA: Diagnosis not present

## 2022-09-04 DIAGNOSIS — Z87891 Personal history of nicotine dependence: Secondary | ICD-10-CM | POA: Diagnosis not present

## 2022-09-04 DIAGNOSIS — Z85828 Personal history of other malignant neoplasm of skin: Secondary | ICD-10-CM | POA: Diagnosis not present

## 2022-09-04 DIAGNOSIS — E559 Vitamin D deficiency, unspecified: Secondary | ICD-10-CM | POA: Diagnosis not present

## 2022-09-04 DIAGNOSIS — Z9484 Stem cells transplant status: Secondary | ICD-10-CM | POA: Diagnosis not present

## 2022-09-04 DIAGNOSIS — Z4789 Encounter for other orthopedic aftercare: Secondary | ICD-10-CM | POA: Diagnosis not present

## 2022-09-04 DIAGNOSIS — G8929 Other chronic pain: Secondary | ICD-10-CM | POA: Diagnosis not present

## 2022-09-04 DIAGNOSIS — E785 Hyperlipidemia, unspecified: Secondary | ICD-10-CM | POA: Diagnosis not present

## 2022-09-05 DIAGNOSIS — M25512 Pain in left shoulder: Secondary | ICD-10-CM | POA: Diagnosis not present

## 2022-09-05 DIAGNOSIS — M75111 Incomplete rotator cuff tear or rupture of right shoulder, not specified as traumatic: Secondary | ICD-10-CM | POA: Diagnosis not present

## 2022-09-06 DIAGNOSIS — M4316 Spondylolisthesis, lumbar region: Secondary | ICD-10-CM | POA: Diagnosis not present

## 2022-09-06 DIAGNOSIS — Z4789 Encounter for other orthopedic aftercare: Secondary | ICD-10-CM | POA: Diagnosis not present

## 2022-09-06 DIAGNOSIS — Z7901 Long term (current) use of anticoagulants: Secondary | ICD-10-CM | POA: Diagnosis not present

## 2022-09-06 DIAGNOSIS — M5441 Lumbago with sciatica, right side: Secondary | ICD-10-CM | POA: Diagnosis not present

## 2022-09-06 DIAGNOSIS — G8929 Other chronic pain: Secondary | ICD-10-CM | POA: Diagnosis not present

## 2022-09-06 DIAGNOSIS — Z8579 Personal history of other malignant neoplasms of lymphoid, hematopoietic and related tissues: Secondary | ICD-10-CM | POA: Diagnosis not present

## 2022-09-06 DIAGNOSIS — M48061 Spinal stenosis, lumbar region without neurogenic claudication: Secondary | ICD-10-CM | POA: Diagnosis not present

## 2022-09-06 DIAGNOSIS — M160 Bilateral primary osteoarthritis of hip: Secondary | ICD-10-CM | POA: Diagnosis not present

## 2022-09-06 DIAGNOSIS — M5136 Other intervertebral disc degeneration, lumbar region: Secondary | ICD-10-CM | POA: Diagnosis not present

## 2022-09-06 DIAGNOSIS — E559 Vitamin D deficiency, unspecified: Secondary | ICD-10-CM | POA: Diagnosis not present

## 2022-09-06 DIAGNOSIS — J309 Allergic rhinitis, unspecified: Secondary | ICD-10-CM | POA: Diagnosis not present

## 2022-09-06 DIAGNOSIS — Z9484 Stem cells transplant status: Secondary | ICD-10-CM | POA: Diagnosis not present

## 2022-09-06 DIAGNOSIS — Z85828 Personal history of other malignant neoplasm of skin: Secondary | ICD-10-CM | POA: Diagnosis not present

## 2022-09-06 DIAGNOSIS — G5603 Carpal tunnel syndrome, bilateral upper limbs: Secondary | ICD-10-CM | POA: Diagnosis not present

## 2022-09-06 DIAGNOSIS — E039 Hypothyroidism, unspecified: Secondary | ICD-10-CM | POA: Diagnosis not present

## 2022-09-06 DIAGNOSIS — Z86711 Personal history of pulmonary embolism: Secondary | ICD-10-CM | POA: Diagnosis not present

## 2022-09-06 DIAGNOSIS — N3281 Overactive bladder: Secondary | ICD-10-CM | POA: Diagnosis not present

## 2022-09-06 DIAGNOSIS — E785 Hyperlipidemia, unspecified: Secondary | ICD-10-CM | POA: Diagnosis not present

## 2022-09-06 DIAGNOSIS — Z79891 Long term (current) use of opiate analgesic: Secondary | ICD-10-CM | POA: Diagnosis not present

## 2022-09-06 DIAGNOSIS — Z87891 Personal history of nicotine dependence: Secondary | ICD-10-CM | POA: Diagnosis not present

## 2022-09-06 DIAGNOSIS — M47816 Spondylosis without myelopathy or radiculopathy, lumbar region: Secondary | ICD-10-CM | POA: Diagnosis not present

## 2022-09-06 DIAGNOSIS — Z981 Arthrodesis status: Secondary | ICD-10-CM | POA: Diagnosis not present

## 2022-09-07 NOTE — Discharge Summary (Signed)
Physician Discharge Summary  Patient ID: Rodney [name] MRN: [id number] DOB/AGE: 1940-05-21 82 y.o.  Admit date: 08/07/2022 Discharge date: 09/07/2022  Admission Diagnoses:Lumbar stenosis with neurogenic claudication  Discharge Diagnoses:  Principal Problem:   Lumbar stenosis with neurogenic claudication Active Problems:   Lumbar adjacent segment disease with spondylolisthesis   Discharged Condition: good  Hospital Course: [name] was admitted, taken to the operating room for a PLIF at L4/5.  He was admitted preop for heparinization after stopping coumadin for his case. Post op he stayed on heparin until his INR ws satisfactory on coumadin. Post op he is ambulating, voiding, and tolerating a regular diet. His wound is clean, dry, and without signs of infection at discharge. He is doing well with less pain then preop.   Treatments: surgery: Lumbar Four-Five Posterior Lumbar Interbody Fusion, Dome X cages filled with autograft Laminectomy L4 beyond the needed exposure for a PLIF Spire plate placement L3-L5  Discharge Exam: Blood pressure 127/73, pulse 89, temperature 98.4 F (36.9 C), resp. rate 19, height 5' 11.5" (1.816 m), weight 88.9 kg, SpO2 98 %. General appearance: alert, cooperative, appears stated age, and mild distress  Disposition: Discharge disposition: 01-Home or Self Care      Spondylolisthesis, Lumbar region  Allergies as of 08/15/2022       Reactions   Blood-group Specific Substance    Must be Leukocyte Reduced and Irradiated due to stem cell transplant.    Prednisone Other (See Comments)   Agitation  Anger        Medication List     TAKE these medications    acetaminophen 500 MG tablet Commonly known as: TYLENOL Take 500-1,000 mg by mouth every 6 (six) hours as needed for headache (pain).   B-12 3000 MCG Caps Take 3,000 mcg by mouth daily.   Biotin 5 MG Caps Take 5 mg by mouth at bedtime.   CALTRATE 600+D PO Take 1 tablet by mouth  2 (two) times daily.   carboxymethylcellulose 0.5 % Soln Commonly known as: REFRESH PLUS Place 1 drop into both eyes daily as needed (dry eyes).   cetirizine 10 MG tablet Commonly known as: ZYRTEC Take 10 mg by mouth every other day. At night   citalopram 10 MG tablet Commonly known as: CELEXA Take 1 tablet (10 mg total) by mouth daily. What changed: when to take this   Dextromethorphan-guaiFENesin 10-100 MG/5ML liquid Take 5 mLs by mouth 2 (two) times daily as needed (cough).   ESTER-C PO Take 1,000 mg by mouth daily.   gabapentin 300 MG capsule Commonly known as: NEURONTIN TAKE 3 CAPSULES BY MOUTH AT BEDTIME**PATIENT NEEDS APT FOR FURTHER REFILLS OF THIS MEDICATION** What changed:  how much to take how to take this when to take this additional instructions   levothyroxine 112 MCG tablet Commonly known as: SYNTHROID Take 112 mcg by mouth daily before breakfast.   lidocaine 5 % ointment Commonly known as: XYLOCAINE Apply 1 Application topically 3 (three) times daily as needed.   multivitamin with minerals Tabs tablet Take 1 tablet by mouth daily.   Myrbetriq 25 MG Tb24 tablet Generic drug: mirabegron ER TAKE 1 TABLET BY MOUTH EVERY DAY   Nasacort Allergy 24HR 55 MCG/ACT Aero nasal inhaler Generic drug: triamcinolone Place 2 sprays into the nose daily as needed (allergies).   nitroGLYCERIN 0.2 mg/hr patch Commonly known as: NITRODUR - Dosed in mg/24 hr PLACE 1/4 TO 1/2 OF PATCH OVER AFFECTED REGION. REMOVE AND REPLACE ONCE DAILY. SLIGHTLY ALTER SKIN PLACEMENT  DAILY What changed:  how much to take how to take this when to take this additional instructions   omeprazole 20 MG capsule Commonly known as: PRILOSEC Take 1 capsule (20 mg total) by mouth 2 (two) times daily.   ondansetron 4 MG tablet Commonly known as: ZOFRAN Take 4 mg by mouth every 8 (eight) hours as needed for nausea or vomiting.   potassium chloride SA 20 MEQ tablet Commonly known as:  KLOR-CON M TAKE 1 TABLET BY MOUTH EVERY DAY What changed: when to take this   tiZANidine 4 MG tablet Commonly known as: ZANAFLEX Take 1 tablet (4 mg total) by mouth every 6 (six) hours as needed for muscle spasms.   tolterodine 4 MG 24 hr capsule Commonly known as: DETROL LA TAKE 1 CAPSULE BY MOUTH EVERY DAY What changed:  how much to take when to take this   Tums Ultra 1000 400 MG chewable tablet Generic drug: calcium elemental as carbonate Chew 1,000-2,000 mg by mouth daily as needed for heartburn.   Vitamin D 1000 units capsule Take 1,000 Units by mouth 2 (two) times daily.   warfarin 2.5 MG tablet Commonly known as: COUMADIN Take 2.5 mg by mouth at bedtime.       ASK your doctor about these medications    oxyCODONE 5 MG immediate release tablet Commonly known as: Oxy IR/ROXICODONE Take 1 tablet (5 mg total) by mouth every 6 (six) hours as needed for up to 8 days for moderate pain. Ask about: Should I take this medication?        Follow-up Information     Coletta Memos, MD Follow up.   Specialty: Neurosurgery Why: keep your scheduled appointment Contact information: [address] [place] [address] [phone]         [facility] Follow up.   Specialty: Home Health Services Why: The home health agency will contact you for the first home visit. Contact information: [address] [place] [address] [phone]                 Signed: Coletta Memos 09/07/2022, 2:09 PM

## 2022-09-11 DIAGNOSIS — E039 Hypothyroidism, unspecified: Secondary | ICD-10-CM | POA: Diagnosis not present

## 2022-09-11 DIAGNOSIS — N3281 Overactive bladder: Secondary | ICD-10-CM | POA: Diagnosis not present

## 2022-09-11 DIAGNOSIS — G8929 Other chronic pain: Secondary | ICD-10-CM | POA: Diagnosis not present

## 2022-09-11 DIAGNOSIS — D689 Coagulation defect, unspecified: Secondary | ICD-10-CM | POA: Diagnosis not present

## 2022-09-12 DIAGNOSIS — Z7901 Long term (current) use of anticoagulants: Secondary | ICD-10-CM | POA: Diagnosis not present

## 2022-09-12 DIAGNOSIS — Z86711 Personal history of pulmonary embolism: Secondary | ICD-10-CM | POA: Diagnosis not present

## 2022-09-12 DIAGNOSIS — Z79891 Long term (current) use of opiate analgesic: Secondary | ICD-10-CM | POA: Diagnosis not present

## 2022-09-12 DIAGNOSIS — Z4789 Encounter for other orthopedic aftercare: Secondary | ICD-10-CM | POA: Diagnosis not present

## 2022-09-12 DIAGNOSIS — E039 Hypothyroidism, unspecified: Secondary | ICD-10-CM | POA: Diagnosis not present

## 2022-09-12 DIAGNOSIS — Z981 Arthrodesis status: Secondary | ICD-10-CM | POA: Diagnosis not present

## 2022-09-12 DIAGNOSIS — E559 Vitamin D deficiency, unspecified: Secondary | ICD-10-CM | POA: Diagnosis not present

## 2022-09-12 DIAGNOSIS — M47816 Spondylosis without myelopathy or radiculopathy, lumbar region: Secondary | ICD-10-CM | POA: Diagnosis not present

## 2022-09-12 DIAGNOSIS — Z87891 Personal history of nicotine dependence: Secondary | ICD-10-CM | POA: Diagnosis not present

## 2022-09-12 DIAGNOSIS — M160 Bilateral primary osteoarthritis of hip: Secondary | ICD-10-CM | POA: Diagnosis not present

## 2022-09-12 DIAGNOSIS — N3281 Overactive bladder: Secondary | ICD-10-CM | POA: Diagnosis not present

## 2022-09-12 DIAGNOSIS — G8929 Other chronic pain: Secondary | ICD-10-CM | POA: Diagnosis not present

## 2022-09-12 DIAGNOSIS — M5441 Lumbago with sciatica, right side: Secondary | ICD-10-CM | POA: Diagnosis not present

## 2022-09-12 DIAGNOSIS — Z9484 Stem cells transplant status: Secondary | ICD-10-CM | POA: Diagnosis not present

## 2022-09-12 DIAGNOSIS — Z85828 Personal history of other malignant neoplasm of skin: Secondary | ICD-10-CM | POA: Diagnosis not present

## 2022-09-12 DIAGNOSIS — M4316 Spondylolisthesis, lumbar region: Secondary | ICD-10-CM | POA: Diagnosis not present

## 2022-09-12 DIAGNOSIS — M5136 Other intervertebral disc degeneration, lumbar region: Secondary | ICD-10-CM | POA: Diagnosis not present

## 2022-09-12 DIAGNOSIS — E785 Hyperlipidemia, unspecified: Secondary | ICD-10-CM | POA: Diagnosis not present

## 2022-09-12 DIAGNOSIS — G5603 Carpal tunnel syndrome, bilateral upper limbs: Secondary | ICD-10-CM | POA: Diagnosis not present

## 2022-09-12 DIAGNOSIS — M48061 Spinal stenosis, lumbar region without neurogenic claudication: Secondary | ICD-10-CM | POA: Diagnosis not present

## 2022-09-12 DIAGNOSIS — Z8579 Personal history of other malignant neoplasms of lymphoid, hematopoietic and related tissues: Secondary | ICD-10-CM | POA: Diagnosis not present

## 2022-09-12 DIAGNOSIS — J309 Allergic rhinitis, unspecified: Secondary | ICD-10-CM | POA: Diagnosis not present

## 2022-09-13 DIAGNOSIS — M160 Bilateral primary osteoarthritis of hip: Secondary | ICD-10-CM | POA: Diagnosis not present

## 2022-09-13 DIAGNOSIS — N3281 Overactive bladder: Secondary | ICD-10-CM | POA: Diagnosis not present

## 2022-09-13 DIAGNOSIS — Z86711 Personal history of pulmonary embolism: Secondary | ICD-10-CM | POA: Diagnosis not present

## 2022-09-13 DIAGNOSIS — G5603 Carpal tunnel syndrome, bilateral upper limbs: Secondary | ICD-10-CM | POA: Diagnosis not present

## 2022-09-13 DIAGNOSIS — Z8579 Personal history of other malignant neoplasms of lymphoid, hematopoietic and related tissues: Secondary | ICD-10-CM | POA: Diagnosis not present

## 2022-09-13 DIAGNOSIS — M4316 Spondylolisthesis, lumbar region: Secondary | ICD-10-CM | POA: Diagnosis not present

## 2022-09-13 DIAGNOSIS — E039 Hypothyroidism, unspecified: Secondary | ICD-10-CM | POA: Diagnosis not present

## 2022-09-13 DIAGNOSIS — M48061 Spinal stenosis, lumbar region without neurogenic claudication: Secondary | ICD-10-CM | POA: Diagnosis not present

## 2022-09-13 DIAGNOSIS — Z9484 Stem cells transplant status: Secondary | ICD-10-CM | POA: Diagnosis not present

## 2022-09-13 DIAGNOSIS — Z4789 Encounter for other orthopedic aftercare: Secondary | ICD-10-CM | POA: Diagnosis not present

## 2022-09-13 DIAGNOSIS — E559 Vitamin D deficiency, unspecified: Secondary | ICD-10-CM | POA: Diagnosis not present

## 2022-09-13 DIAGNOSIS — Z87891 Personal history of nicotine dependence: Secondary | ICD-10-CM | POA: Diagnosis not present

## 2022-09-13 DIAGNOSIS — M5441 Lumbago with sciatica, right side: Secondary | ICD-10-CM | POA: Diagnosis not present

## 2022-09-13 DIAGNOSIS — M5136 Other intervertebral disc degeneration, lumbar region: Secondary | ICD-10-CM | POA: Diagnosis not present

## 2022-09-13 DIAGNOSIS — Z85828 Personal history of other malignant neoplasm of skin: Secondary | ICD-10-CM | POA: Diagnosis not present

## 2022-09-13 DIAGNOSIS — Z7901 Long term (current) use of anticoagulants: Secondary | ICD-10-CM | POA: Diagnosis not present

## 2022-09-13 DIAGNOSIS — E785 Hyperlipidemia, unspecified: Secondary | ICD-10-CM | POA: Diagnosis not present

## 2022-09-13 DIAGNOSIS — J309 Allergic rhinitis, unspecified: Secondary | ICD-10-CM | POA: Diagnosis not present

## 2022-09-13 DIAGNOSIS — Z79891 Long term (current) use of opiate analgesic: Secondary | ICD-10-CM | POA: Diagnosis not present

## 2022-09-13 DIAGNOSIS — G8929 Other chronic pain: Secondary | ICD-10-CM | POA: Diagnosis not present

## 2022-09-13 DIAGNOSIS — M47816 Spondylosis without myelopathy or radiculopathy, lumbar region: Secondary | ICD-10-CM | POA: Diagnosis not present

## 2022-09-13 DIAGNOSIS — Z981 Arthrodesis status: Secondary | ICD-10-CM | POA: Diagnosis not present

## 2022-09-14 ENCOUNTER — Ambulatory Visit: Payer: Medicare Other | Admitting: Internal Medicine

## 2022-09-18 DIAGNOSIS — J309 Allergic rhinitis, unspecified: Secondary | ICD-10-CM | POA: Diagnosis not present

## 2022-09-18 DIAGNOSIS — G5603 Carpal tunnel syndrome, bilateral upper limbs: Secondary | ICD-10-CM | POA: Diagnosis not present

## 2022-09-18 DIAGNOSIS — E785 Hyperlipidemia, unspecified: Secondary | ICD-10-CM | POA: Diagnosis not present

## 2022-09-18 DIAGNOSIS — M47816 Spondylosis without myelopathy or radiculopathy, lumbar region: Secondary | ICD-10-CM | POA: Diagnosis not present

## 2022-09-18 DIAGNOSIS — Z9484 Stem cells transplant status: Secondary | ICD-10-CM | POA: Diagnosis not present

## 2022-09-18 DIAGNOSIS — Z981 Arthrodesis status: Secondary | ICD-10-CM | POA: Diagnosis not present

## 2022-09-18 DIAGNOSIS — E559 Vitamin D deficiency, unspecified: Secondary | ICD-10-CM | POA: Diagnosis not present

## 2022-09-18 DIAGNOSIS — Z7901 Long term (current) use of anticoagulants: Secondary | ICD-10-CM | POA: Diagnosis not present

## 2022-09-18 DIAGNOSIS — Z4789 Encounter for other orthopedic aftercare: Secondary | ICD-10-CM | POA: Diagnosis not present

## 2022-09-18 DIAGNOSIS — Z86711 Personal history of pulmonary embolism: Secondary | ICD-10-CM | POA: Diagnosis not present

## 2022-09-18 DIAGNOSIS — M4316 Spondylolisthesis, lumbar region: Secondary | ICD-10-CM | POA: Diagnosis not present

## 2022-09-18 DIAGNOSIS — Z8579 Personal history of other malignant neoplasms of lymphoid, hematopoietic and related tissues: Secondary | ICD-10-CM | POA: Diagnosis not present

## 2022-09-18 DIAGNOSIS — N3281 Overactive bladder: Secondary | ICD-10-CM | POA: Diagnosis not present

## 2022-09-18 DIAGNOSIS — M48061 Spinal stenosis, lumbar region without neurogenic claudication: Secondary | ICD-10-CM | POA: Diagnosis not present

## 2022-09-18 DIAGNOSIS — Z85828 Personal history of other malignant neoplasm of skin: Secondary | ICD-10-CM | POA: Diagnosis not present

## 2022-09-18 DIAGNOSIS — M160 Bilateral primary osteoarthritis of hip: Secondary | ICD-10-CM | POA: Diagnosis not present

## 2022-09-18 DIAGNOSIS — E039 Hypothyroidism, unspecified: Secondary | ICD-10-CM | POA: Diagnosis not present

## 2022-09-18 DIAGNOSIS — M5441 Lumbago with sciatica, right side: Secondary | ICD-10-CM | POA: Diagnosis not present

## 2022-09-18 DIAGNOSIS — Z79891 Long term (current) use of opiate analgesic: Secondary | ICD-10-CM | POA: Diagnosis not present

## 2022-09-18 DIAGNOSIS — Z87891 Personal history of nicotine dependence: Secondary | ICD-10-CM | POA: Diagnosis not present

## 2022-09-18 DIAGNOSIS — G8929 Other chronic pain: Secondary | ICD-10-CM | POA: Diagnosis not present

## 2022-09-18 DIAGNOSIS — M5136 Other intervertebral disc degeneration, lumbar region: Secondary | ICD-10-CM | POA: Diagnosis not present

## 2022-09-24 ENCOUNTER — Encounter (HOSPITAL_COMMUNITY): Payer: Self-pay | Admitting: Neurosurgery

## 2022-09-24 ENCOUNTER — Other Ambulatory Visit: Payer: Self-pay

## 2022-09-24 ENCOUNTER — Encounter (HOSPITAL_COMMUNITY): Payer: Self-pay

## 2022-09-24 ENCOUNTER — Inpatient Hospital Stay (HOSPITAL_COMMUNITY)
Admission: AD | Admit: 2022-09-24 | Discharge: 2022-09-30 | DRG: 460 | Disposition: A | Discharge status: Home / Self Care | Payer: Medicare Other | Source: Ambulatory Visit | Attending: Neurosurgery | Admitting: Neurosurgery

## 2022-09-24 DIAGNOSIS — Z8042 Family history of malignant neoplasm of prostate: Secondary | ICD-10-CM

## 2022-09-24 DIAGNOSIS — Z79899 Other long term (current) drug therapy: Secondary | ICD-10-CM

## 2022-09-24 DIAGNOSIS — Z888 Allergy status to other drugs, medicaments and biological substances status: Secondary | ICD-10-CM

## 2022-09-24 DIAGNOSIS — Z7901 Long term (current) use of anticoagulants: Secondary | ICD-10-CM | POA: Diagnosis not present

## 2022-09-24 DIAGNOSIS — T84296A Other mechanical complication of internal fixation device of vertebrae, initial encounter: Secondary | ICD-10-CM | POA: Diagnosis not present

## 2022-09-24 DIAGNOSIS — Z86718 Personal history of other venous thrombosis and embolism: Secondary | ICD-10-CM | POA: Diagnosis not present

## 2022-09-24 DIAGNOSIS — E785 Hyperlipidemia, unspecified: Secondary | ICD-10-CM | POA: Diagnosis not present

## 2022-09-24 DIAGNOSIS — K219 Gastro-esophageal reflux disease without esophagitis: Secondary | ICD-10-CM | POA: Diagnosis not present

## 2022-09-24 DIAGNOSIS — Z833 Family history of diabetes mellitus: Secondary | ICD-10-CM | POA: Diagnosis not present

## 2022-09-24 DIAGNOSIS — T8484XA Pain due to internal orthopedic prosthetic devices, implants and grafts, initial encounter: Secondary | ICD-10-CM | POA: Diagnosis not present

## 2022-09-24 DIAGNOSIS — Z79890 Hormone replacement therapy (postmenopausal): Secondary | ICD-10-CM | POA: Diagnosis not present

## 2022-09-24 DIAGNOSIS — Z832 Family history of diseases of the blood and blood-forming organs and certain disorders involving the immune mechanism: Secondary | ICD-10-CM

## 2022-09-24 DIAGNOSIS — T84216A Breakdown (mechanical) of internal fixation device of vertebrae, initial encounter: Principal | ICD-10-CM | POA: Diagnosis present

## 2022-09-24 DIAGNOSIS — Z8781 Personal history of (healed) traumatic fracture: Secondary | ICD-10-CM

## 2022-09-24 DIAGNOSIS — Z87891 Personal history of nicotine dependence: Secondary | ICD-10-CM

## 2022-09-24 DIAGNOSIS — E039 Hypothyroidism, unspecified: Secondary | ICD-10-CM | POA: Diagnosis present

## 2022-09-24 DIAGNOSIS — Y831 Surgical operation with implant of artificial internal device as the cause of abnormal reaction of the patient, or of later complication, without mention of misadventure at the time of the procedure: Secondary | ICD-10-CM | POA: Diagnosis present

## 2022-09-24 DIAGNOSIS — Z86711 Personal history of pulmonary embolism: Secondary | ICD-10-CM | POA: Diagnosis not present

## 2022-09-24 DIAGNOSIS — Z8546 Personal history of malignant neoplasm of prostate: Secondary | ICD-10-CM | POA: Diagnosis not present

## 2022-09-24 DIAGNOSIS — F3342 Major depressive disorder, recurrent, in full remission: Secondary | ICD-10-CM | POA: Diagnosis not present

## 2022-09-24 DIAGNOSIS — Z807 Family history of other malignant neoplasms of lymphoid, hematopoietic and related tissues: Secondary | ICD-10-CM

## 2022-09-24 LAB — CBC WITH DIFFERENTIAL/PLATELET
Abs Immature Granulocytes: 0.03 10*3/uL (ref 0.00–0.07)
Basophils Absolute: 0.1 10*3/uL (ref 0.0–0.1)
Basophils Relative: 1 %
Eosinophils Absolute: 0.1 10*3/uL (ref 0.0–0.5)
Eosinophils Relative: 2 %
HCT: 36.9 % — ABNORMAL LOW (ref 39.0–52.0)
Hemoglobin: 12.2 g/dL — ABNORMAL LOW (ref 13.0–17.0)
Immature Granulocytes: 0 %
Lymphocytes Relative: 19 %
Lymphs Abs: 1.3 10*3/uL (ref 0.7–4.0)
MCH: 30.6 pg (ref 26.0–34.0)
MCHC: 33.1 g/dL (ref 30.0–36.0)
MCV: 92.5 fL (ref 80.0–100.0)
Monocytes Absolute: 1 10*3/uL (ref 0.1–1.0)
Monocytes Relative: 14 %
Neutro Abs: 4.3 10*3/uL (ref 1.7–7.7)
Neutrophils Relative %: 64 %
Platelets: 198 10*3/uL (ref 150–400)
RBC: 3.99 MIL/uL — ABNORMAL LOW (ref 4.22–5.81)
RDW: 14.6 % (ref 11.5–15.5)
WBC: 6.9 10*3/uL (ref 4.0–10.5)
nRBC: 0 % (ref 0.0–0.2)

## 2022-09-24 LAB — APTT: aPTT: 35 seconds (ref 24–36)

## 2022-09-24 LAB — BASIC METABOLIC PANEL
Anion gap: 9 (ref 5–15)
BUN: 27 mg/dL — ABNORMAL HIGH (ref 8–23)
CO2: 21 mmol/L — ABNORMAL LOW (ref 22–32)
Calcium: 8.4 mg/dL — ABNORMAL LOW (ref 8.9–10.3)
Chloride: 106 mmol/L (ref 98–111)
Creatinine, Ser: 1.03 mg/dL (ref 0.61–1.24)
GFR, Estimated: >60 mL/min (ref >60)
Glucose, Bld: 95 mg/dL (ref 70–99)
Potassium: 4.2 mmol/L (ref 3.5–5.1)
Sodium: 136 mmol/L (ref 135–145)

## 2022-09-24 LAB — PROTIME-INR
INR: 1.7 — ABNORMAL HIGH (ref 0.8–1.2)
Prothrombin Time: 20.5 seconds — ABNORMAL HIGH (ref 11.4–15.2)

## 2022-09-24 MED ORDER — POTASSIUM CHLORIDE CRYS ER 20 MEQ PO TBCR
20.0000 meq | EXTENDED_RELEASE_TABLET | Freq: Every day | ORAL | Status: DC
  Administered 2022-09-24 – 2022-09-29 (×6): 20 meq via ORAL
  Filled 2022-09-24 (×6): qty 1

## 2022-09-24 MED ORDER — FESOTERODINE FUMARATE ER 4 MG PO TB24
4.0000 mg | ORAL_TABLET | Freq: Every day | ORAL | Status: DC
  Administered 2022-09-24 – 2022-09-30 (×7): 4 mg via ORAL
  Filled 2022-09-24 (×7): qty 1

## 2022-09-24 MED ORDER — ACETAMINOPHEN 650 MG RE SUPP: 650.0000 mg | Freq: Four times a day (QID) | RECTAL | Status: DC | PRN

## 2022-09-24 MED ORDER — ESTER-C PO TABS: ORAL_TABLET | Freq: Every day | ORAL | Status: DC

## 2022-09-24 MED ORDER — NITROGLYCERIN 0.2 MG/HR TD PT24: 0.2000 mg | MEDICATED_PATCH | Freq: Every day | TRANSDERMAL | Status: DC | PRN

## 2022-09-24 MED ORDER — TRIAMCINOLONE ACETONIDE 55 MCG/ACT NA AERO: 2.0000 | INHALATION_SPRAY | Freq: Every day | NASAL | Status: DC | PRN

## 2022-09-24 MED ORDER — PANTOPRAZOLE SODIUM 40 MG PO TBEC
80.0000 mg | DELAYED_RELEASE_TABLET | Freq: Every day | ORAL | Status: DC
  Administered 2022-09-25 – 2022-09-30 (×6): 80 mg via ORAL
  Filled 2022-09-24 (×6): qty 2

## 2022-09-24 MED ORDER — HYDROCODONE-ACETAMINOPHEN 5-325 MG PO TABS
1.0000 | ORAL_TABLET | ORAL | Status: DC | PRN
  Administered 2022-09-24 – 2022-09-25 (×2): 1 via ORAL
  Administered 2022-09-25: 2 via ORAL
  Administered 2022-09-25 – 2022-09-27 (×3): 1 via ORAL
  Administered 2022-09-27 – 2022-09-28 (×2): 2 via ORAL
  Administered 2022-09-28: 1 via ORAL
  Administered 2022-09-29 – 2022-09-30 (×5): 2 via ORAL
  Filled 2022-09-24: qty 1
  Filled 2022-09-24: qty 2
  Filled 2022-09-24: qty 1
  Filled 2022-09-24 (×3): qty 2
  Filled 2022-09-24: qty 1
  Filled 2022-09-24 (×3): qty 2
  Filled 2022-09-24: qty 1
  Filled 2022-09-24: qty 2
  Filled 2022-09-24: qty 1
  Filled 2022-09-24: qty 2
  Filled 2022-09-24: qty 1

## 2022-09-24 MED ORDER — POTASSIUM CHLORIDE IN NACL 20-0.9 MEQ/L-% IV SOLN
INTRAVENOUS | Status: DC
  Filled 2022-09-24 (×3): qty 1000

## 2022-09-24 MED ORDER — B-12 3000 MCG PO CAPS: 3000.0000 ug | ORAL_CAPSULE | Freq: Every day | ORAL | Status: DC

## 2022-09-24 MED ORDER — GABAPENTIN 300 MG PO CAPS
600.0000 mg | ORAL_CAPSULE | Freq: Every day | ORAL | Status: DC
  Administered 2022-09-24 – 2022-09-29 (×6): 600 mg via ORAL
  Filled 2022-09-24 (×6): qty 2

## 2022-09-24 MED ORDER — LEVOTHYROXINE SODIUM 112 MCG PO TABS
112.0000 ug | ORAL_TABLET | Freq: Every day | ORAL | Status: DC
  Administered 2022-09-25 – 2022-09-30 (×6): 112 ug via ORAL
  Filled 2022-09-24 (×6): qty 1

## 2022-09-24 MED ORDER — LORATADINE 10 MG PO TABS
10.0000 mg | ORAL_TABLET | Freq: Every day | ORAL | Status: DC
  Administered 2022-09-24 – 2022-09-30 (×7): 10 mg via ORAL
  Filled 2022-09-24 (×7): qty 1

## 2022-09-24 MED ORDER — POLYVINYL ALCOHOL 1.4 % OP SOLN: 1.0000 [drp] | OPHTHALMIC | Status: DC | PRN

## 2022-09-24 MED ORDER — ONDANSETRON HCL 4 MG PO TABS: 4.0000 mg | ORAL_TABLET | Freq: Three times a day (TID) | ORAL | Status: DC | PRN

## 2022-09-24 MED ORDER — BIOTIN 5 MG PO CAPS: 5.0000 mg | ORAL_CAPSULE | Freq: Every day | ORAL | Status: DC

## 2022-09-24 MED ORDER — CALCIUM CARBONATE ANTACID 500 MG PO CHEW
500.0000 mg | CHEWABLE_TABLET | Freq: Every day | ORAL | Status: DC
  Administered 2022-09-26 – 2022-09-30 (×5): 500 mg via ORAL
  Filled 2022-09-24 (×6): qty 3

## 2022-09-24 MED ORDER — VITAMIN B-12 1000 MCG PO TABS
3000.0000 ug | ORAL_TABLET | Freq: Every day | ORAL | Status: DC
  Administered 2022-09-25 – 2022-09-30 (×6): 3000 ug via ORAL
  Filled 2022-09-24 (×6): qty 3

## 2022-09-24 MED ORDER — TIZANIDINE HCL 4 MG PO TABS
4.0000 mg | ORAL_TABLET | Freq: Four times a day (QID) | ORAL | Status: DC | PRN
  Administered 2022-09-25 – 2022-09-27 (×4): 4 mg via ORAL
  Filled 2022-09-24 (×4): qty 1

## 2022-09-24 MED ORDER — CITALOPRAM HYDROBROMIDE 20 MG PO TABS
10.0000 mg | ORAL_TABLET | Freq: Every day | ORAL | Status: DC
  Administered 2022-09-24 – 2022-09-29 (×6): 10 mg via ORAL
  Filled 2022-09-24 (×6): qty 1

## 2022-09-24 MED ORDER — ACETAMINOPHEN 325 MG PO TABS: 650.0000 mg | ORAL_TABLET | Freq: Four times a day (QID) | ORAL | Status: DC | PRN

## 2022-09-24 MED ORDER — HEPARIN (PORCINE) 25000 UT/250ML-% IV SOLN
1400.0000 [IU]/h | INTRAVENOUS | Status: DC
  Administered 2022-09-24 – 2022-09-26 (×3): 1400 [IU]/h via INTRAVENOUS
  Filled 2022-09-24 (×3): qty 250

## 2022-09-24 MED ORDER — CARBOXYMETHYLCELLULOSE SODIUM 0.5 % OP SOLN: 1.0000 [drp] | Freq: Every day | OPHTHALMIC | Status: DC | PRN

## 2022-09-24 MED ORDER — MORPHINE SULFATE (PF) 2 MG/ML IV SOLN
2.0000 mg | INTRAVENOUS | Status: DC | PRN
  Administered 2022-09-25: 2 mg via INTRAVENOUS
  Filled 2022-09-24: qty 1

## 2022-09-24 MED ORDER — MIRABEGRON ER 25 MG PO TB24
25.0000 mg | ORAL_TABLET | Freq: Every day | ORAL | Status: DC
  Administered 2022-09-24 – 2022-09-30 (×7): 25 mg via ORAL
  Filled 2022-09-24 (×7): qty 1

## 2022-09-24 NOTE — H&P (Signed)
BP 101/65 (BP Location: Left Arm)   Pulse 71   Temp 98.4 F (36.9 C)   Resp 18   Ht 5\' 11"  (1.803 m)   SpO2 95%   BMI 27.34 kg/m  Rodney [name] is admitted for a heparin bridge prior to a hardware revision in the lumbar spine. He is on Coumadin, and has stopped taking it in anticipation of the procedure.  Allergies  Allergen Reactions   Blood-Group Specific Substance     Must be Leukocyte Reduced and Irradiated due to stem cell transplant.    Prednisone Other (See Comments)    Agitation  Anger   Past Medical History:  Diagnosis Date   Anemia 02-16-11   01-05-11- post surgery-Transfusions x 4 units   Arthritis    Bone pain 02/18/2013   Cellulitis    Clotting disorder (HCC)    Complication of anesthesia 02-16-11   Pt. speaks of awakening during the surgery from back  surgery   Degenerative disc disease 02-16-11   01-05-11 L3 fusion done   Esophageal reflux 05/31/2008   Qualifier: Diagnosis of  By: Lovell Sheehan MD, Balinda Quails    Hernia 02-16-11   left inguinal hernia at present   Hypothyroidism 02/01/2014   Multiple myeloma 02-16-11   suspected tumor  left sacral   Pancreatitis    Personal history of traumatic fracture 07/10/2012   Prostate cancer [facility])    Pulmonary embolism (HCC)    Second degree atrioventricular block 11/26/2012   Shortness of breath 02-16-11   hx. Pulmonary emboli x2 (9 yrs/ 3'12 -last)  ,h Past Surgical History:  Procedure Laterality Date   APPENDECTOMY     CATARACT EXTRACTION     CHOLECYSTECTOMY  04/06/2010   laparoscopic-inflammation with stones   INGUINAL HERNIA REPAIR  02/20/2011   Procedure: HERNIA REPAIR INGUINAL ADULT;  Surgeon: Velora Heckler, MD;  Location: [facility] ORS;  Service: General;  Laterality: Left;  Repair Left Inguinal Hernia with Mesh   LUMBAR DISC SURGERY  02-16-11    01-05-11 Lumbar surgery L3(complicated by loss of blood volume)/ 01-09-11 then Lumbar fusion done with retained  hardware    LUMBAR LAMINECTOMY/DECOMPRESSION MICRODISCECTOMY Right  01/16/2022   Procedure: Right Lumbar Five- Sacral One Laminectomy for synovial/facet cyst;  Surgeon: Coletta Memos, MD;  Location: [facility] OR;  Service: Neurosurgery;  Laterality: Right;  3C   PROSTATE SURGERY Bilateral 2011   TONSILLECTOMY AND ADENOIDECTOMY     Prior to Admission medications   Medication Sig Start Date End Date Taking? Authorizing Provider  acetaminophen (TYLENOL) 500 MG tablet Take 500-1,000 mg by mouth every 6 (six) hours as needed for headache (pain).    [provider]  Bioflavonoid Products (ESTER-C PO) Take 1,000 mg by mouth daily.    [provider]  Biotin 5 MG CAPS Take 5 mg by mouth at bedtime.    [provider]  Calcium Carbonate-Vitamin D (CALTRATE 600+D PO) Take 1 tablet by mouth 2 (two) times daily.    [provider]  calcium elemental as carbonate (TUMS ULTRA 1000) 400 MG chewable tablet Chew 1,000-2,000 mg by mouth daily as needed for heartburn.    [provider]  carboxymethylcellulose (REFRESH PLUS) 0.5 % SOLN Place 1 drop into both eyes daily as needed (dry eyes).    [provider]  cetirizine (ZYRTEC) 10 MG tablet Take 10 mg by mouth every other day. At night    [provider]  Cholecalciferol (VITAMIN D) 1000 UNITS capsule Take 1,000 Units by mouth  2 (two) times daily.    [provider]  citalopram (CELEXA) 10 MG tablet Take 1 tablet (10 mg total) by mouth daily. Patient taking differently: Take 10 mg by mouth at bedtime. 06/25/22   [name], FNP  Cyanocobalamin (B-12) 3000 MCG CAPS Take 3,000 mcg by mouth daily.    [provider]  Dextromethorphan-guaiFENesin 10-100 MG/5ML liquid Take 5 mLs by mouth 2 (two) times daily as needed (cough).    [provider]  gabapentin (NEURONTIN) 300 MG capsule TAKE 3 CAPSULES BY MOUTH AT BEDTIME**PATIENT NEEDS APT FOR FURTHER REFILLS OF THIS MEDICATION** Patient taking differently: Take 600 mg by mouth at bedtime. 05/18/21    de [place], Raymond J, MD  levothyroxine (SYNTHROID) 112 MCG tablet Take 112 mcg by mouth daily before breakfast.    [provider]  lidocaine (XYLOCAINE) 5 % ointment Apply 1 Application topically 3 (three) times daily as needed. Patient not taking: Reported on 08/03/2022 07/13/22   Gailen Shelter, PA  Multiple Vitamin (MULTIVITAMIN WITH MINERALS) TABS tablet Take 1 tablet by mouth daily.    [provider]  MYRBETRIQ 25 MG TB24 tablet TAKE 1 TABLET BY MOUTH EVERY DAY 10/14/21   [name], [name], MD  nitroGLYCERIN (NITRODUR - DOSED IN MG/24 HR) 0.2 mg/hr patch PLACE 1/4 TO 1/2 OF PATCH OVER AFFECTED REGION. REMOVE AND REPLACE ONCE DAILY. SLIGHTLY ALTER SKIN PLACEMENT DAILY Patient taking differently: Place 0.05-0.2 mg onto the skin See admin instructions. Place 0.05 mg (1/4 patch) to 0.2 mg (full patch) as needed for pain 01/30/21   de [place], Buren Kos, MD  omeprazole (PRILOSEC) 20 MG capsule Take 1 capsule (20 mg total) by mouth 2 (two) times daily. 05/24/22   de [place], Buren Kos, MD  ondansetron (ZOFRAN) 4 MG tablet Take 4 mg by mouth every 8 (eight) hours as needed for nausea or vomiting.    [provider]  potassium chloride SA (KLOR-CON M) 20 MEQ tablet TAKE 1 TABLET BY MOUTH EVERY DAY Patient taking differently: Take 20 mEq by mouth at bedtime. 11/21/21   de [place], Buren Kos, MD  tiZANidine (ZANAFLEX) 4 MG tablet Take 1 tablet (4 mg total) by mouth every 6 (six) hours as needed for muscle spasms. 08/14/22   Coletta Memos, MD  tolterodine (DETROL LA) 4 MG 24 hr capsule TAKE 1 CAPSULE BY MOUTH EVERY DAY Patient taking differently: Take 4 mg by mouth at bedtime. 01/12/22   [name], [name], MD  triamcinolone (NASACORT ALLERGY 24HR) 55 MCG/ACT AERO nasal inhaler Place 2 sprays into the nose daily as needed (allergies).    [provider]  warfarin (COUMADIN) 2.5 MG tablet Take 2.5 mg by mouth at bedtime.    [provider]   Family History  Problem  Relation Age of Onset   Lymphoma Father 70   Prostate cancer Father    Clotting disorder Mother        blood clots   Deep vein thrombosis Mother    Lung disease Brother    Diabetes Son    Diabetes Daughter    Diabetes Daughter    Colon cancer Neg Hx    Esophageal cancer Neg Hx    Rectal cancer Neg Hx    Stomach cancer Neg Hx    Social History   Socioeconomic History   Marital status: Married    Spouse name: Not on file   Number of children: 3   Years of education: Not on file   Highest education  level: Not on file  Occupational History   Occupation: retired  Tobacco Use   Smoking status: Former    Current packs/day: 0.00    Types: Cigarettes    Quit date: 02/15/1969    Years since quitting: 53.6   Smokeless tobacco: Never   Tobacco comments:    quit 50 yrs  Substance and Sexual Activity   Alcohol use: No    Alcohol/week: 0.0 standard drinks of alcohol   Drug use: No   Sexual activity: Yes  Other Topics Concern   Not on file  Social History Narrative   Married. 3 kids. 4 grandkids   Regular exercise-yes      Working at [employer] 35 hours a week. Considering [employer] again   Social Determinants of Health   Financial Resource Strain: Not on file  Food Insecurity: No Food Insecurity (09/24/2022)   Hunger Vital Sign    Worried About Running Out of Food in the Last Year: Never true    Ran Out of Food in the Last Year: Never true  Transportation Needs: No Transportation Needs (09/24/2022)   PRAPARE - Administrator, Civil Service (Medical): No    Lack of Transportation (Non-Medical): No  Physical Activity: Not on file  Stress: No Stress Concern Present (08/04/2020)   Received from [facility], [facility] of Occupational Health - Occupational Stress Questionnaire    Feeling of Stress : Not at all  Social Connections: Not on file  Intimate Partner Violence: Not At Risk (09/24/2022)   Humiliation, Afraid, Rape, and Kick  questionnaire    Fear of Current or Ex-Partner: No    Emotionally Abused: No    Physically Abused: No    Sexually Abused: No   Physical Exam Constitutional:      Appearance: Normal appearance.  HENT:     Head: Normocephalic and atraumatic.     Right Ear: External ear normal.     Left Ear: External ear normal.     Nose: Nose normal.     Mouth/Throat:     Mouth: Mucous membranes are moist.     Pharynx: Oropharynx is clear.  Eyes:     Extraocular Movements: Extraocular movements intact.     Conjunctiva/sclera: Conjunctivae normal.     Pupils: Pupils are equal, round, and reactive to light.  Cardiovascular:     Rate and Rhythm: Normal rate.  Pulmonary:     Effort: Pulmonary effort is normal.     Breath sounds: Normal breath sounds.  Abdominal:     General: Abdomen is flat.  Musculoskeletal:        General: Normal range of motion.  Skin:    General: Skin is warm and dry.  Neurological:     Mental Status: He is alert and oriented to person, place, and time.     Cranial Nerves: Cranial nerves 2-12 are intact. No cranial nerve deficit.     Sensory: No sensory deficit.     Motor: Weakness present.     Coordination: Coordination is intact. Coordination normal.     Gait: Gait abnormal.     Deep Tendon Reflexes: Reflexes normal. Babinski sign absent on the right side. Babinski sign absent on the left side.     Reflex Scores:      Tricep reflexes are 2+ on the right side and 2+ on the left side.      Bicep reflexes are 2+ on the right side and 2+ on the  left side.      Brachioradialis reflexes are 2+ on the right side and 2+ on the left side.      Patellar reflexes are 2+ on the right side and 2+ on the left side.      Achilles reflexes are 2+ on the right side and 2+ on the left side. Psychiatric:        Mood and Affect: Mood normal.        Behavior: Behavior normal.        Thought Content: Thought content normal.        Judgment: Judgment normal.

## 2022-09-24 NOTE — Progress Notes (Signed)
ANTICOAGULATION CONSULT NOTE - Initial Consult  Pharmacy Consult for Heparin bridge while Warfarin on hold Indication: History of PE  Allergies  Allergen Reactions   Blood-Group Specific Substance     Must be Leukocyte Reduced and Irradiated due to stem cell transplant.    Prednisone Other (See Comments)    Agitation  Anger    Patient Measurements: Height: 5\' 11"  (180.3 cm) Weight: 86.2 kg (190 lb 0.6 oz) IBW/kg (Calculated) : 75.3 Heparin Dosing Weight: total weight  Vital Signs: Temp: 98.4 F (36.9 C) (07/22 1420) BP: 101/65 (07/22 1420) Pulse Rate: 71 (07/22 1420)  Labs: No results for input(s): "HGB", "HCT", "PLT", "APTT", "LABPROT", "INR", "HEPARINUNFRC", "HEPRLOWMOCWT", "CREATININE", "CKTOTAL", "CKMB", "TROPONINIHS" in the last 72 hours.  CrCl cannot be calculated (Patient's most recent lab result is older than the maximum 21 days allowed.).   Medical History: Past Medical History:  Diagnosis Date   Anemia 02-16-11   01-05-11- post surgery-Transfusions x 4 units   Arthritis    Bone pain 02/18/2013   Cellulitis    Clotting disorder (HCC)    Complication of anesthesia 02-16-11   Pt. speaks of awakening during the surgery from back  surgery   Degenerative disc disease 02-16-11   01-05-11 L3 fusion done   Esophageal reflux 05/31/2008   Qualifier: Diagnosis of  By: Lovell Sheehan MD, Balinda Quails    Hernia 02-16-11   left inguinal hernia at present   Hypothyroidism 02/01/2014   Multiple myeloma 02-16-11   suspected tumor  left sacral   Pancreatitis    Personal history of traumatic fracture 07/10/2012   Prostate cancer [facility])    Pulmonary embolism (HCC)    Second degree atrioventricular block 11/26/2012   Shortness of breath 02-16-11   hx. Pulmonary emboli x2 (9 yrs/ 3'12 -last)    Medications:  Scheduled:   calcium carbonate  500 mg Oral Daily   citalopram  10 mg Oral QHS   [START ON 09/25/2022] vitamin B-12  3,000 mcg Oral Daily   fesoterodine  4 mg Oral Daily    gabapentin  600 mg Oral QHS   [START ON 09/25/2022] levothyroxine  112 mcg Oral QAC breakfast   loratadine  10 mg Oral Daily   mirabegron ER  25 mg Oral Daily   [START ON 09/25/2022] pantoprazole  80 mg Oral Daily   potassium chloride SA  20 mEq Oral QHS   Infusions:   0.9 % NaCl with KCl 20 mEq / L     PRN: acetaminophen **OR** acetaminophen, HYDROcodone-acetaminophen, morphine injection, nitroGLYCERIN, ondansetron, polyvinyl alcohol, tiZANidine, triamcinolone  Assessment: 82 yo male on chronic warfarin for hx PE admitted for heparin bridge prior to a hardware revision in the lumbar spine.   Dose PTA: 2.5mg  daily except 5mg  MWF - last dose taken 7/19 INR 1.7 Hgb 12.2  Goal of Therapy:  Heparin level 0.3 - 0.7 units/ml Monitor platelets by anticoagulation protocol: Yes   Plan:  Start heparin infusion at 1400 units/hr Check anti-Xa level in 8 hours and daily while on heparin Continue to monitor H&H and platelets Follow up timing of procedure and plans for holding heparin prior to  Loralee Pacas, PharmD, BCPS 09/24/2022,6:24 PM  Please check AMION for all [facility] Pharmacy phone numbers After 10:00 PM, call Main Pharmacy [phone]

## 2022-09-24 NOTE — Plan of Care (Signed)

## 2022-09-25 LAB — CBC
HCT: 37.6 % — ABNORMAL LOW (ref 39.0–52.0)
Hemoglobin: 12.3 g/dL — ABNORMAL LOW (ref 13.0–17.0)
MCH: 29.9 pg (ref 26.0–34.0)
MCHC: 32.7 g/dL (ref 30.0–36.0)
MCV: 91.5 fL (ref 80.0–100.0)
Platelets: 190 10*3/uL (ref 150–400)
RBC: 4.11 MIL/uL — ABNORMAL LOW (ref 4.22–5.81)
RDW: 14.6 % (ref 11.5–15.5)
WBC: 8.4 10*3/uL (ref 4.0–10.5)
nRBC: 0 % (ref 0.0–0.2)

## 2022-09-25 LAB — APTT: aPTT: 84 seconds — ABNORMAL HIGH (ref 24–36)

## 2022-09-25 LAB — HEPARIN LEVEL (UNFRACTIONATED): Heparin Unfractionated: 0.42 IU/mL (ref 0.30–0.70)

## 2022-09-25 LAB — PROTIME-INR
INR: 1.5 — ABNORMAL HIGH (ref 0.8–1.2)
Prothrombin Time: 18.1 seconds — ABNORMAL HIGH (ref 11.4–15.2)

## 2022-09-25 NOTE — Progress Notes (Addendum)
ANTICOAGULATION CONSULT NOTE - Initial Consult  Pharmacy Consult for Heparin bridge while Warfarin on hold Indication: History of PE  Allergies  Allergen Reactions   Blood-Group Specific Substance     Must be Leukocyte Reduced and Irradiated due to stem cell transplant.    Prednisone Other (See Comments)    Agitation  Anger    Patient Measurements: Height: 5\' 11"  (180.3 cm) Weight: 86.2 kg (190 lb 0.6 oz) IBW/kg (Calculated) : 75.3 Heparin Dosing Weight: total weight  Vital Signs: Temp: 97.6 F (36.4 C) (07/23 0731) Temp Source: Oral (07/23 0731) BP: 111/81 (07/23 0731) Pulse Rate: 63 (07/23 0731)  Labs: Recent Labs    09/24/22 1802 09/25/22 0642  HGB 12.2* 12.3*  HCT 36.9* 37.6*  PLT 198 190  APTT 35 84*  LABPROT 20.5* 18.1*  INR 1.7* 1.5*  HEPARINUNFRC  --  0.42  CREATININE 1.03  --     Estimated Creatinine Clearance: 60.9 mL/min (by C-G formula based on SCr of 1.03 mg/dL).   Medical History: Past Medical History:  Diagnosis Date   Anemia 02-16-11   01-05-11- post surgery-Transfusions x 4 units   Arthritis    Bone pain 02/18/2013   Cellulitis    Clotting disorder (HCC)    Complication of anesthesia 02-16-11   Pt. speaks of awakening during the surgery from back  surgery   Degenerative disc disease 02-16-11   01-05-11 L3 fusion done   Esophageal reflux 05/31/2008   Qualifier: Diagnosis of  By: Lovell Sheehan MD, Balinda Quails    Hernia 02-16-11   left inguinal hernia at present   Hypothyroidism 02/01/2014   Multiple myeloma 02-16-11   suspected tumor  left sacral   Pancreatitis    Personal history of traumatic fracture 07/10/2012   Prostate cancer [facility])    Pulmonary embolism (HCC)    Second degree atrioventricular block 11/26/2012   Shortness of breath 02-16-11   hx. Pulmonary emboli x2 (9 yrs/ 3'12 -last)    Medications:  Scheduled:   calcium carbonate  500 mg Oral Daily   citalopram  10 mg Oral QHS   vitamin B-12  3,000 mcg Oral Daily   fesoterodine  4 mg  Oral Daily   gabapentin  600 mg Oral QHS   levothyroxine  112 mcg Oral QAC breakfast   loratadine  10 mg Oral Daily   mirabegron ER  25 mg Oral Daily   pantoprazole  80 mg Oral Daily   potassium chloride SA  20 mEq Oral QHS   Infusions:   0.9 % NaCl with KCl 20 mEq / L 50 mL/hr at 09/25/22 0400   heparin 1,400 Units/hr (09/25/22 0400)   PRN: acetaminophen **OR** acetaminophen, HYDROcodone-acetaminophen, morphine injection, nitroGLYCERIN, ondansetron, polyvinyl alcohol, tiZANidine, triamcinolone  Assessment: 82 yo male on chronic warfarin for hx PE admitted for heparin bridge prior to a hardware revision in the lumbar spine.   HL 0.42 @ 1400 u/hr. Level therapeutic. CBC stable.   Dose PTA: 2.5mg  daily except 5mg  MWF - last dose taken 7/19 INR 1.5  Goal of Therapy:  Heparin level 0.3 - 0.7 units/ml Monitor platelets by anticoagulation protocol: Yes   Plan:  Continue heparin infusion at 1400 units/hr Check Heparin level with am labs Continue to monitor H&H and platelets Follow up timing of procedure and plans for holding heparin prior to  Verdene Rio, PharmD PGY1 Pharmacy Resident

## 2022-09-25 NOTE — Plan of Care (Signed)

## 2022-09-25 NOTE — Progress Notes (Signed)
Patient ID: Rodney [name], male   DOB: September 26, 1940, 82 y.o.   MRN: [id number] BP 118/71 (BP Location: Right Arm)   Pulse 71   Temp 98.3 F (36.8 C)   Resp 20   Ht 5\' 11"  (1.803 m)   Wt 86.2 kg   SpO2 94%   BMI 26.50 kg/m  Alert and oriented x 4, speech is clear and fluent Moving all extremities Awaiting OR

## 2022-09-26 ENCOUNTER — Other Ambulatory Visit: Payer: Self-pay | Admitting: Neurosurgery

## 2022-09-26 LAB — CBC
HCT: 36.2 % — ABNORMAL LOW (ref 39.0–52.0)
Hemoglobin: 11.8 g/dL — ABNORMAL LOW (ref 13.0–17.0)
MCH: 29.7 pg (ref 26.0–34.0)
MCHC: 32.6 g/dL (ref 30.0–36.0)
MCV: 91.2 fL (ref 80.0–100.0)
Platelets: 178 10*3/uL (ref 150–400)
RBC: 3.97 MIL/uL — ABNORMAL LOW (ref 4.22–5.81)
RDW: 14.6 % (ref 11.5–15.5)
WBC: 7 10*3/uL (ref 4.0–10.5)
nRBC: 0 % (ref 0.0–0.2)

## 2022-09-26 LAB — HEPARIN LEVEL (UNFRACTIONATED)
Heparin Unfractionated: 0.6 IU/mL (ref 0.30–0.70)
Heparin Unfractionated: 0.33 IU/mL (ref 0.30–0.70)

## 2022-09-26 MED ORDER — HEPARIN (PORCINE) 25000 UT/250ML-% IV SOLN
1400.0000 [IU]/h | INTRAVENOUS | Status: DC
  Administered 2022-09-26 – 2022-09-28 (×3): 1400 [IU]/h via INTRAVENOUS
  Filled 2022-09-26 (×3): qty 250

## 2022-09-26 NOTE — Progress Notes (Signed)
Patient ID: Rodney [name], male   DOB: 1940-11-23, 82 y.o.   MRN: [id number] BP 101/64 (BP Location: Right Arm)   Pulse 73   Temp 99.3 F (37.4 C)   Resp 19   Ht 5\' 11"  (1.803 m)   Wt 86.2 kg   SpO2 97%   BMI 26.50 kg/m  Alert and oriented x 4, speech is clear and fluent Moving all extremities well Case scheduled for friday

## 2022-09-26 NOTE — Progress Notes (Signed)
ANTICOAGULATION CONSULT NOTE - Initial Consult  Pharmacy Consult for Heparin bridge while Warfarin on hold Indication: History of PE  Allergies  Allergen Reactions   Blood-Group Specific Substance     Must be Leukocyte Reduced and Irradiated due to stem cell transplant.    Prednisone Other (See Comments)    Agitation  Anger    Patient Measurements: Height: 5\' 11"  (180.3 cm) Weight: 86.2 kg (190 lb 0.6 oz) IBW/kg (Calculated) : 75.3 Heparin Dosing Weight: total weight  Vital Signs: Temp: 99.3 F (37.4 C) (07/24 1907) Temp Source: Oral (07/24 1338) BP: 101/64 (07/24 1907) Pulse Rate: Rodney (07/24 1907)  Labs: Recent Labs    09/24/22 1802 09/25/22 0642 09/26/22 0125 09/26/22 2112  HGB 12.2* 12.3* 11.8*  --   HCT 36.9* 37.6* 36.2*  --   PLT 198 190 178  --   APTT 35 84*  --   --   LABPROT 20.5* 18.1*  --   --   INR 1.7* 1.5*  --   --   HEPARINUNFRC  --  0.42 0.60 0.33  CREATININE 1.03  --   --   --     Estimated Creatinine Clearance: 60.9 mL/min (by C-G formula based on SCr of 1.03 mg/dL).   Medical History: Past Medical History:  Diagnosis Date   Anemia 02-16-11   01-05-11- post surgery-Transfusions x 4 units   Arthritis    Bone pain 02/18/2013   Cellulitis    Clotting disorder (HCC)    Complication of anesthesia 02-16-11   Pt. speaks of awakening during the surgery from back  surgery   Degenerative disc disease 02-16-11   01-05-11 L3 fusion done   Esophageal reflux 05/31/2008   Qualifier: Diagnosis of  By: Lovell Sheehan MD, Balinda Quails    Hernia 02-16-11   left inguinal hernia at present   Hypothyroidism 02/01/2014   Multiple myeloma 02-16-11   suspected tumor  left sacral   Pancreatitis    Personal history of traumatic fracture 07/10/2012   Prostate cancer [facility])    Pulmonary embolism (HCC)    Second degree atrioventricular block 11/26/2012   Shortness of breath 02-16-11   hx. Pulmonary emboli x2 (9 yrs/ 3'12 -last)    Medications:  Scheduled:   calcium  carbonate  500 mg Oral Daily   citalopram  10 mg Oral QHS   vitamin B-12  3,000 mcg Oral Daily   fesoterodine  4 mg Oral Daily   gabapentin  600 mg Oral QHS   levothyroxine  112 mcg Oral QAC breakfast   loratadine  10 mg Oral Daily   mirabegron ER  25 mg Oral Daily   pantoprazole  80 mg Oral Daily   potassium chloride SA  20 mEq Oral QHS   Infusions:   0.9 % NaCl with KCl 20 mEq / L 50 mL/hr at 09/26/22 0450   heparin 1,400 Units/hr (09/26/22 1331)   PRN: acetaminophen **OR** acetaminophen, HYDROcodone-acetaminophen, morphine injection, nitroGLYCERIN, ondansetron, polyvinyl alcohol, tiZANidine, triamcinolone  Assessment: 82 yo [name] on chronic warfarin for hx PE admitted for heparin bridge prior to a hardware revision in the lumbar spine.   Heparin level 0.33 and therapeutic after holding due to bleeding at IV line earlier in the day.  Dose PTA: 2.5mg  daily except 5mg  MWF - last dose taken 7/19 INR 1.5  Goal of Therapy:  Heparin level 0.3 - 0.7 units/ml Monitor platelets by anticoagulation protocol: Yes   Plan:  Continue heparin infusion at 1400 units/hr Daily heparin level and  CBC  Fredonia Highland, PharmD, BCPS, [facility] Clinical Pharmacist [phone] Please check AMION for all [facility] Pharmacy numbers 09/26/2022

## 2022-09-26 NOTE — Progress Notes (Addendum)
ANTICOAGULATION CONSULT NOTE - Initial Consult  Pharmacy Consult for Heparin bridge while Warfarin on hold Indication: History of PE  Allergies  Allergen Reactions   Blood-Group Specific Substance     Must be Leukocyte Reduced and Irradiated due to stem cell transplant.    Prednisone Other (See Comments)    Agitation  Anger    Patient Measurements: Height: 5\' 11"  (180.3 cm) Weight: 86.2 kg (190 lb 0.6 oz) IBW/kg (Calculated) : 75.3 Heparin Dosing Weight: total weight  Vital Signs: Temp: 97.9 F (36.6 C) (07/24 0801) Temp Source: Oral (07/24 0801) BP: 106/66 (07/24 0801) Pulse Rate: 65 (07/24 0801)  Labs: Recent Labs    09/24/22 1802 09/25/22 0642 09/26/22 0125  HGB 12.2* 12.3* 11.8*  HCT 36.9* 37.6* 36.2*  PLT 198 190 178  APTT 35 84*  --   LABPROT 20.5* 18.1*  --   INR 1.7* 1.5*  --   HEPARINUNFRC  --  0.42 0.60  CREATININE 1.03  --   --     Estimated Creatinine Clearance: 60.9 mL/min (by C-G formula based on SCr of 1.03 mg/dL).   Medical History: Past Medical History:  Diagnosis Date   Anemia 02-16-11   01-05-11- post surgery-Transfusions x 4 units   Arthritis    Bone pain 02/18/2013   Cellulitis    Clotting disorder (HCC)    Complication of anesthesia 02-16-11   Pt. speaks of awakening during the surgery from back  surgery   Degenerative disc disease 02-16-11   01-05-11 L3 fusion done   Esophageal reflux 05/31/2008   Qualifier: Diagnosis of  By: Lovell Sheehan MD, Balinda Quails    Hernia 02-16-11   left inguinal hernia at present   Hypothyroidism 02/01/2014   Multiple myeloma 02-16-11   suspected tumor  left sacral   Pancreatitis    Personal history of traumatic fracture 07/10/2012   Prostate cancer [facility])    Pulmonary embolism (HCC)    Second degree atrioventricular block 11/26/2012   Shortness of breath 02-16-11   hx. Pulmonary emboli x2 (9 yrs/ 3'12 -last)    Medications:  Scheduled:   calcium carbonate  500 mg Oral Daily   citalopram  10 mg Oral QHS    vitamin B-12  3,000 mcg Oral Daily   fesoterodine  4 mg Oral Daily   gabapentin  600 mg Oral QHS   levothyroxine  112 mcg Oral QAC breakfast   loratadine  10 mg Oral Daily   mirabegron ER  25 mg Oral Daily   pantoprazole  80 mg Oral Daily   potassium chloride SA  20 mEq Oral QHS   Infusions:   0.9 % NaCl with KCl 20 mEq / L 50 mL/hr at 09/26/22 0450   heparin 1,400 Units/hr (09/26/22 0723)   PRN: acetaminophen **OR** acetaminophen, HYDROcodone-acetaminophen, morphine injection, nitroGLYCERIN, ondansetron, polyvinyl alcohol, tiZANidine, triamcinolone  Assessment: 82 yo male on chronic warfarin for hx PE admitted for heparin bridge prior to a hardware revision in the lumbar spine.   HL 0.6 @ 1400 u/hr. Level therapeutic. CBC stable. RN noted temporarily stopping the drip due to bleeding from IV site, stated will restart from another line. Bleeding stopped. IV removed. Waiting on IV team to place new iv then will resume heparin.  Dose PTA: 2.5mg  daily except 5mg  MWF - last dose taken 7/19 INR 1.5  Goal of Therapy:  Heparin level 0.3 - 0.7 units/ml Monitor platelets by anticoagulation protocol: Yes   Plan:  Continue heparin infusion at 1400 units/hr Check 8hr  Heparin level Continue to monitor H&H and platelets Follow up timing of procedure and plans for holding heparin prior to Follow up new IV line for heparin  Verdene Rio, PharmD PGY1 Pharmacy Resident

## 2022-09-26 NOTE — TOC CM/SW Note (Signed)
Transition of Care [facility]) - Inpatient Brief Assessment   Patient Details  Name: Rodney [name] MRN: [id number] Date of Birth: September 29, 1940  Transition of Care [facility]) CM/SW Contact:    Epifanio Lesches, RN Phone Number: 09/26/2022, 2:51 PM   Clinical Narrative: Admitted for heparin bridge prior to a hardware revision in the lumbar spine.  Awaiting surgery...  TOC following for needs.  Transition of Care Asessment: Insurance and Status: Insurance coverage has been reviewed Patient has primary care physician: Yes Home environment has been reviewed: From home with wife Prior level of function:: PTA independent with ADL's, DME: RW, [employer] Prior/Current Home Services: Current home services (Active with [facility] / PT and OT) Social Determinants of Health Reivew: SDOH reviewed no interventions necessary Readmission risk has been reviewed: No Transition of care needs: no transition of care needs at this time

## 2022-09-27 LAB — CBC WITH DIFFERENTIAL/PLATELET
Abs Immature Granulocytes: 0.04 10*3/uL (ref 0.00–0.07)
Basophils Absolute: 0.1 10*3/uL (ref 0.0–0.1)
Basophils Relative: 1 %
Eosinophils Absolute: 0.1 10*3/uL (ref 0.0–0.5)
Eosinophils Relative: 2 %
HCT: 36.3 % — ABNORMAL LOW (ref 39.0–52.0)
Hemoglobin: 12.3 g/dL — ABNORMAL LOW (ref 13.0–17.0)
Immature Granulocytes: 1 %
Lymphocytes Relative: 16 %
Lymphs Abs: 1 10*3/uL (ref 0.7–4.0)
MCH: 31.3 pg (ref 26.0–34.0)
MCHC: 33.9 g/dL (ref 30.0–36.0)
MCV: 92.4 fL (ref 80.0–100.0)
Monocytes Absolute: 1 10*3/uL (ref 0.1–1.0)
Monocytes Relative: 15 %
Neutro Abs: 4.2 10*3/uL (ref 1.7–7.7)
Neutrophils Relative %: 65 %
Platelets: 194 10*3/uL (ref 150–400)
RBC: 3.93 MIL/uL — ABNORMAL LOW (ref 4.22–5.81)
RDW: 14.7 % (ref 11.5–15.5)
WBC: 6.5 10*3/uL (ref 4.0–10.5)
nRBC: 0 % (ref 0.0–0.2)

## 2022-09-27 LAB — CBC
HCT: 38.5 % — ABNORMAL LOW (ref 39.0–52.0)
Hemoglobin: 12.6 g/dL — ABNORMAL LOW (ref 13.0–17.0)
MCH: 29.7 pg (ref 26.0–34.0)
MCHC: 32.7 g/dL (ref 30.0–36.0)
MCV: 90.8 fL (ref 80.0–100.0)
Platelets: 192 10*3/uL (ref 150–400)
RDW: 14.6 % (ref 11.5–15.5)
WBC: 6.8 10*3/uL (ref 4.0–10.5)
nRBC: 0 % (ref 0.0–0.2)

## 2022-09-27 NOTE — Care Management Important Message (Signed)
Important Message  Patient Details  Name: Rodney [name] MRN: [id number] Date of Birth: Apr 07, 1940   Medicare Important Message Given:  Yes     Sherilyn Banker 09/27/2022, 1:47 PM

## 2022-09-27 NOTE — Plan of Care (Signed)

## 2022-09-27 NOTE — Progress Notes (Signed)
Patient ID: Rodney [name], male   DOB: 1941/02/04, 82 y.o.   MRN: [id number] BP 120/70 (BP Location: Right Arm)   Pulse 73   Temp 98.3 F (36.8 C) (Oral)   Resp 17   Ht 5\' 11"  (1.803 m)   Wt 86.2 kg   SpO2 98%   BMI 26.50 kg/m  Alert and oriented x 4, speech is clear and fluent Moving all extremities well OR tomorrow.

## 2022-09-27 NOTE — Progress Notes (Signed)
ANTICOAGULATION CONSULT NOTE - Initial Consult  Pharmacy Consult for Heparin bridge while Warfarin on hold Indication: History of PE  Allergies  Allergen Reactions   Blood-Group Specific Substance     Must be Leukocyte Reduced and Irradiated due to stem cell transplant.    Prednisone Other (See Comments)    Agitation  Anger    Patient Measurements: Height: 5\' 11"  (180.3 cm) Weight: 86.2 kg (190 lb 0.6 oz) IBW/kg (Calculated) : 75.3 Heparin Dosing Weight: total weight  Vital Signs: Temp: 98 F (36.7 C) (07/25 0751) Temp Source: Oral (07/25 0435) BP: 118/72 (07/25 0751) Pulse Rate: 55 (07/25 0751)  Labs: Recent Labs    09/24/22 1802 09/24/22 1802 09/25/22 0642 09/26/22 0125 09/26/22 2112 09/27/22 0206  HGB 12.2*  --  12.3* 11.8*  --  12.6*  HCT 36.9*  --  37.6* 36.2*  --  38.5*  PLT 198  --  190 178  --  192  APTT 35  --  84*  --   --   --   LABPROT 20.5*  --  18.1*  --   --   --   INR 1.7*  --  1.5*  --   --   --   HEPARINUNFRC  --    < > 0.42 0.60 0.33 0.40  CREATININE 1.03  --   --   --   --   --    < > = values in this interval not displayed.    Estimated Creatinine Clearance: 60.9 mL/min (by C-G formula based on SCr of 1.03 mg/dL).   Medical History: Past Medical History:  Diagnosis Date   Anemia 02-16-11   01-05-11- post surgery-Transfusions x 4 units   Arthritis    Bone pain 02/18/2013   Cellulitis    Clotting disorder (HCC)    Complication of anesthesia 02-16-11   Pt. speaks of awakening during the surgery from back  surgery   Degenerative disc disease 02-16-11   01-05-11 L3 fusion done   Esophageal reflux 05/31/2008   Qualifier: Diagnosis of  By: Lovell Sheehan MD, Balinda Quails    Hernia 02-16-11   left inguinal hernia at present   Hypothyroidism 02/01/2014   Multiple myeloma 02-16-11   suspected tumor  left sacral   Pancreatitis    Personal history of traumatic fracture 07/10/2012   Prostate cancer [facility])    Pulmonary embolism (HCC)    Second degree  atrioventricular block 11/26/2012   Shortness of breath 02-16-11   hx. Pulmonary emboli x2 (9 yrs/ 3'12 -last)    Medications:  Scheduled:   calcium carbonate  500 mg Oral Daily   citalopram  10 mg Oral QHS   vitamin B-12  3,000 mcg Oral Daily   fesoterodine  4 mg Oral Daily   gabapentin  600 mg Oral QHS   levothyroxine  112 mcg Oral QAC breakfast   loratadine  10 mg Oral Daily   mirabegron ER  25 mg Oral Daily   pantoprazole  80 mg Oral Daily   potassium chloride SA  20 mEq Oral QHS   Infusions:   0.9 % NaCl with KCl 20 mEq / L 50 mL/hr at 09/26/22 0450   heparin 1,400 Units/hr (09/27/22 0757)   PRN: acetaminophen **OR** acetaminophen, HYDROcodone-acetaminophen, morphine injection, nitroGLYCERIN, ondansetron, polyvinyl alcohol, tiZANidine, triamcinolone  Assessment: 82 yo male on chronic warfarin for hx PE admitted for heparin bridge prior to a hardware revision in the lumbar spine.   Heparin continues to be therapeutic. Plan for  OR in AM.  Dose PTA: 2.5mg  daily except 5mg  MWF - last dose taken 7/19 INR 1.5  Goal of Therapy:  Heparin level 0.3 - 0.7 units/ml Monitor platelets by anticoagulation protocol: Yes   Plan:  Continue heparin infusion at 1400 units/hr Daily heparin level and CBC  Ulyses Southward, PharmD, BCIDP, AAHIVP, CPP Infectious Disease Pharmacist 09/27/2022 12:57 PM

## 2022-09-27 NOTE — Plan of Care (Signed)

## 2022-09-28 ENCOUNTER — Encounter (HOSPITAL_COMMUNITY): Payer: Self-pay | Admitting: Neurosurgery

## 2022-09-28 ENCOUNTER — Inpatient Hospital Stay (HOSPITAL_COMMUNITY): Payer: Medicare Other

## 2022-09-28 ENCOUNTER — Encounter (HOSPITAL_COMMUNITY)
Admission: AD | Disposition: A | Discharge status: Home / Self Care | Payer: Self-pay | Source: Ambulatory Visit | Attending: Neurosurgery

## 2022-09-28 ENCOUNTER — Other Ambulatory Visit: Payer: Self-pay

## 2022-09-28 DIAGNOSIS — T84296A Other mechanical complication of internal fixation device of vertebrae, initial encounter: Secondary | ICD-10-CM | POA: Diagnosis not present

## 2022-09-28 DIAGNOSIS — E039 Hypothyroidism, unspecified: Secondary | ICD-10-CM | POA: Diagnosis not present

## 2022-09-28 DIAGNOSIS — E785 Hyperlipidemia, unspecified: Secondary | ICD-10-CM | POA: Diagnosis not present

## 2022-09-28 DIAGNOSIS — F3342 Major depressive disorder, recurrent, in full remission: Secondary | ICD-10-CM | POA: Diagnosis not present

## 2022-09-28 LAB — SURGICAL PCR SCREEN
MRSA, PCR: NEGATIVE
Staphylococcus aureus: NEGATIVE

## 2022-09-28 SURGERY — POSTERIOR LUMBAR FUSION 1 WITH HARDWARE REMOVAL
Anesthesia: General | Site: Spine Lumbar

## 2022-09-28 MED ORDER — PHENYLEPHRINE HCL-NACL 20-0.9 MG/250ML-% IV SOLN
INTRAVENOUS | Status: DC | PRN
  Administered 2022-09-28: 15 ug/min via INTRAVENOUS

## 2022-09-28 MED ORDER — ROCURONIUM BROMIDE 10 MG/ML (PF) SYRINGE
PREFILLED_SYRINGE | INTRAVENOUS | Status: DC | PRN
  Administered 2022-09-28 (×2): 5 mg via INTRAVENOUS
  Administered 2022-09-28: 10 mg via INTRAVENOUS
  Administered 2022-09-28 (×2): 5 mg via INTRAVENOUS
  Administered 2022-09-28: 60 mg via INTRAVENOUS

## 2022-09-28 MED ORDER — PROPOFOL 10 MG/ML IV BOLUS
INTRAVENOUS | Status: AC
  Filled 2022-09-28: qty 20

## 2022-09-28 MED ORDER — DEXAMETHASONE SODIUM PHOSPHATE 10 MG/ML IJ SOLN
INTRAMUSCULAR | Status: AC
  Filled 2022-09-28: qty 1

## 2022-09-28 MED ORDER — CHLORHEXIDINE GLUCONATE CLOTH 2 % EX PADS: 6.0000 | MEDICATED_PAD | Freq: Once | CUTANEOUS | Status: DC

## 2022-09-28 MED ORDER — LIDOCAINE-EPINEPHRINE 0.5 %-1:200000 IJ SOLN
INTRAMUSCULAR | Status: DC | PRN
  Administered 2022-09-28: 10 mL

## 2022-09-28 MED ORDER — BUPIVACAINE HCL (PF) 0.5 % IJ SOLN
INTRAMUSCULAR | Status: AC
  Filled 2022-09-28: qty 30

## 2022-09-28 MED ORDER — PROPOFOL 10 MG/ML IV BOLUS
INTRAVENOUS | Status: DC | PRN
Start: 2022-09-28 — End: 2022-09-28
  Administered 2022-09-28: 120 mg via INTRAVENOUS

## 2022-09-28 MED ORDER — FENTANYL CITRATE (PF) 250 MCG/5ML IJ SOLN
INTRAMUSCULAR | Status: AC
  Filled 2022-09-28: qty 5

## 2022-09-28 MED ORDER — HYDROMORPHONE HCL 1 MG/ML IJ SOLN
INTRAMUSCULAR | Status: AC
  Filled 2022-09-28: qty 1

## 2022-09-28 MED ORDER — 0.9 % SODIUM CHLORIDE (POUR BTL) OPTIME
TOPICAL | Status: DC | PRN
  Administered 2022-09-28: 1000 mL

## 2022-09-28 MED ORDER — OXYCODONE HCL 5 MG PO TABS: 5.0000 mg | ORAL_TABLET | Freq: Once | ORAL | Status: DC | PRN

## 2022-09-28 MED ORDER — ORAL CARE MOUTH RINSE: 15.0000 mL | Freq: Once | OROMUCOSAL | Status: AC

## 2022-09-28 MED ORDER — ONDANSETRON HCL 4 MG/2ML IJ SOLN
INTRAMUSCULAR | Status: AC
  Filled 2022-09-28: qty 2

## 2022-09-28 MED ORDER — LIDOCAINE 2% (20 MG/ML) 5 ML SYRINGE
INTRAMUSCULAR | Status: AC
  Filled 2022-09-28: qty 5

## 2022-09-28 MED ORDER — CEFAZOLIN SODIUM-DEXTROSE 2-4 GM/100ML-% IV SOLN
2.0000 g | INTRAVENOUS | Status: AC
  Administered 2022-09-28: 2 g via INTRAVENOUS
  Filled 2022-09-28: qty 100

## 2022-09-28 MED ORDER — OXYCODONE HCL 5 MG/5ML PO SOLN: 5.0000 mg | Freq: Once | ORAL | Status: DC | PRN

## 2022-09-28 MED ORDER — FENTANYL CITRATE (PF) 250 MCG/5ML IJ SOLN
INTRAMUSCULAR | Status: DC | PRN
  Administered 2022-09-28: 50 ug via INTRAVENOUS
  Administered 2022-09-28 (×2): 75 ug via INTRAVENOUS
  Administered 2022-09-28: 150 ug via INTRAVENOUS
  Administered 2022-09-28 (×2): 25 ug via INTRAVENOUS

## 2022-09-28 MED ORDER — ONDANSETRON HCL 4 MG/2ML IJ SOLN
INTRAMUSCULAR | Status: DC | PRN
  Administered 2022-09-28: 4 mg via INTRAVENOUS

## 2022-09-28 MED ORDER — CHLORHEXIDINE GLUCONATE 0.12 % MT SOLN: 15.0000 mL | Freq: Once | OROMUCOSAL | Status: AC

## 2022-09-28 MED ORDER — SUGAMMADEX SODIUM 200 MG/2ML IV SOLN
INTRAVENOUS | Status: DC | PRN
  Administered 2022-09-28: 200 mg via INTRAVENOUS

## 2022-09-28 MED ORDER — PHENYLEPHRINE 80 MCG/ML (10ML) SYRINGE FOR IV PUSH (FOR BLOOD PRESSURE SUPPORT)
PREFILLED_SYRINGE | INTRAVENOUS | Status: DC | PRN
  Administered 2022-09-28: 80 ug via INTRAVENOUS
  Administered 2022-09-28: 40 ug via INTRAVENOUS
  Administered 2022-09-28 (×3): 80 ug via INTRAVENOUS

## 2022-09-28 MED ORDER — CHLORHEXIDINE GLUCONATE 0.12 % MT SOLN
OROMUCOSAL | Status: AC
  Administered 2022-09-28: 15 mL via OROMUCOSAL
  Filled 2022-09-28: qty 15

## 2022-09-28 MED ORDER — THROMBIN 20000 UNITS EX SOLR: CUTANEOUS | Status: DC | PRN

## 2022-09-28 MED ORDER — MIDAZOLAM HCL 2 MG/2ML IJ SOLN: 0.5000 mg | Freq: Once | INTRAMUSCULAR | Status: DC | PRN

## 2022-09-28 MED ORDER — ROCURONIUM BROMIDE 10 MG/ML (PF) SYRINGE
PREFILLED_SYRINGE | INTRAVENOUS | Status: AC
  Filled 2022-09-28: qty 10

## 2022-09-28 MED ORDER — DIPHENHYDRAMINE HCL 50 MG/ML IJ SOLN
INTRAMUSCULAR | Status: DC | PRN
  Administered 2022-09-28: 12.5 mg via INTRAVENOUS

## 2022-09-28 MED ORDER — LIDOCAINE 2% (20 MG/ML) 5 ML SYRINGE
INTRAMUSCULAR | Status: DC | PRN
  Administered 2022-09-28: 60 mg via INTRAVENOUS

## 2022-09-28 MED ORDER — HYDROMORPHONE HCL 1 MG/ML IJ SOLN
0.2500 mg | INTRAMUSCULAR | Status: DC | PRN
  Administered 2022-09-28 (×3): 0.5 mg via INTRAVENOUS

## 2022-09-28 MED ORDER — LACTATED RINGERS IV SOLN: INTRAVENOUS | Status: DC

## 2022-09-28 MED ORDER — THROMBIN 20000 UNITS EX SOLR
CUTANEOUS | Status: AC
  Filled 2022-09-28: qty 20000

## 2022-09-28 MED ORDER — LIDOCAINE-EPINEPHRINE 0.5 %-1:200000 IJ SOLN
INTRAMUSCULAR | Status: AC
  Filled 2022-09-28: qty 50

## 2022-09-28 MED ORDER — PROMETHAZINE HCL 25 MG/ML IJ SOLN: 6.2500 mg | INTRAMUSCULAR | Status: DC | PRN

## 2022-09-28 MED ORDER — EPHEDRINE SULFATE-NACL 50-0.9 MG/10ML-% IV SOSY
PREFILLED_SYRINGE | INTRAVENOUS | Status: DC | PRN
  Administered 2022-09-28: 5 mg via INTRAVENOUS
  Administered 2022-09-28: 15 mg via INTRAVENOUS
  Administered 2022-09-28: 5 mg via INTRAVENOUS

## 2022-09-28 MED ORDER — DIPHENHYDRAMINE HCL 50 MG/ML IJ SOLN
INTRAMUSCULAR | Status: AC
  Filled 2022-09-28: qty 1

## 2022-09-28 SURGICAL SUPPLY — 61 items
ADH SKN CLS APL DERMABOND .7 (GAUZE/BANDAGES/DRESSINGS) ×1
APL SKNCLS STERI-STRIP NONHPOA (GAUZE/BANDAGES/DRESSINGS)
BAG COUNTER SPONGE SURGICOUNT (BAG) ×1 IMPLANT
BAG SPNG CNTER NS LX DISP (BAG) ×1
BASKET BONE COLLECTION (BASKET) ×1 IMPLANT
BENZOIN TINCTURE PRP APPL 2/3 (GAUZE/BANDAGES/DRESSINGS) IMPLANT
BLADE BONE MILL MEDIUM (MISCELLANEOUS) ×1 IMPLANT
BLADE CLIPPER SURG (BLADE) IMPLANT
BONE CANC CHIPS 20CC PCAN1/4 (Bone Implant) ×1 IMPLANT
BUR MATCHSTICK NEURO 3.0 LAGG (BURR) ×1 IMPLANT
CANISTER SUCT 3000ML PPV (MISCELLANEOUS) ×1 IMPLANT
CNTNR URN SCR LID CUP LEK RST (MISCELLANEOUS) ×1 IMPLANT
CONT SPEC 4OZ STRL OR WHT (MISCELLANEOUS) ×1
DERMABOND ADVANCED .7 DNX12 (GAUZE/BANDAGES/DRESSINGS) ×1 IMPLANT
DRAPE C-ARM 42X72 X-RAY (DRAPES) ×2 IMPLANT
DRAPE LAPAROTOMY 100X72X124 (DRAPES) ×1 IMPLANT
DRAPE SURG 17X23 STRL (DRAPES) ×1 IMPLANT
DRSG OPSITE POSTOP 4X8 (GAUZE/BANDAGES/DRESSINGS) IMPLANT
DURAPREP 26ML APPLICATOR (WOUND CARE) ×1 IMPLANT
ELECT REM PT RETURN 9FT ADLT (ELECTROSURGICAL) ×1
ELECTRODE REM PT RTRN 9FT ADLT (ELECTROSURGICAL) ×1 IMPLANT
GAUZE 4X4 16PLY ~~LOC~~+RFID DBL (SPONGE) IMPLANT
GAUZE SPONGE 4X4 12PLY STRL (GAUZE/BANDAGES/DRESSINGS) IMPLANT
GLOVE ECLIPSE 6.5 STRL STRAW (GLOVE) ×2 IMPLANT
GLOVE EXAM NITRILE XL STR (GLOVE) IMPLANT
GOWN STRL REUS W/ TWL LRG LVL3 (GOWN DISPOSABLE) ×2 IMPLANT
GOWN STRL REUS W/ TWL XL LVL3 (GOWN DISPOSABLE) IMPLANT
GOWN STRL REUS W/TWL 2XL LVL3 (GOWN DISPOSABLE) IMPLANT
GOWN STRL REUS W/TWL LRG LVL3 (GOWN DISPOSABLE) ×2
GOWN STRL REUS W/TWL XL LVL3 (GOWN DISPOSABLE)
GRAFT BNE CANC CHIPS 1-8 20CC (Bone Implant) IMPLANT
GRAFT BONE PROTEIOS MED 2.5CC (Orthopedic Implant) IMPLANT
KIT BASIN OR (CUSTOM PROCEDURE TRAY) ×1 IMPLANT
KIT POSITION SURG JACKSON T1 (MISCELLANEOUS) ×1 IMPLANT
KIT TURNOVER KIT B (KITS) ×1 IMPLANT
MILL BONE PREP (MISCELLANEOUS) IMPLANT
NDL HYPO 25X1 1.5 SAFETY (NEEDLE) ×1 IMPLANT
NDL SPNL 18GX3.5 QUINCKE PK (NEEDLE) IMPLANT
NEEDLE HYPO 25X1 1.5 SAFETY (NEEDLE) ×1
NEEDLE SPNL 18GX3.5 QUINCKE PK (NEEDLE)
NS IRRIG 1000ML POUR BTL (IV SOLUTION) ×1 IMPLANT
PACK LAMINECTOMY NEURO (CUSTOM PROCEDURE TRAY) ×1 IMPLANT
PAD ARMBOARD 7.5X6 YLW CONV (MISCELLANEOUS) ×3 IMPLANT
ROD SOLERA 70MM (Rod) ×1 IMPLANT
ROD SOLERA 70X4.75X (Rod) IMPLANT
SCREW BREAKOFF M4 (Screw) IMPLANT
SCREW MAS 6.5X50 (Screw) IMPLANT
SCREW SET SOLERA (Screw) ×3 IMPLANT
SCREW SET SOLERA TI (Screw) IMPLANT
SPIKE FLUID TRANSFER (MISCELLANEOUS) ×1 IMPLANT
SPONGE SURGIFOAM ABS GEL 100 (HEMOSTASIS) ×1 IMPLANT
SPONGE T-LAP 4X18 ~~LOC~~+RFID (SPONGE) IMPLANT
STRIP CLOSURE SKIN 1/2X4 (GAUZE/BANDAGES/DRESSINGS) IMPLANT
SUT PROLENE 6 0 BV (SUTURE) IMPLANT
SUT VIC AB 0 CT1 18XCR BRD8 (SUTURE) ×1 IMPLANT
SUT VIC AB 0 CT1 8-18 (SUTURE) ×1
SUT VIC AB 2-0 CT1 18 (SUTURE) ×1 IMPLANT
SUT VIC AB 3-0 SH 8-18 (SUTURE) ×1 IMPLANT
TOWEL GREEN STERILE (TOWEL DISPOSABLE) ×1 IMPLANT
TOWEL GREEN STERILE FF (TOWEL DISPOSABLE) ×1 IMPLANT
WATER STERILE IRR 1000ML POUR (IV SOLUTION) ×1 IMPLANT

## 2022-09-28 NOTE — Anesthesia Procedure Notes (Addendum)
Procedure Name: Intubation Date/Time: 09/28/2022 2:52 PM  Performed by: Darlina Guys, CRNAPre-anesthesia Checklist: Patient identified, Emergency Drugs available, Suction available and Patient being monitored Patient Re-evaluated:Patient Re-evaluated prior to induction Oxygen Delivery Method: Circle system utilized Preoxygenation: Pre-oxygenation with 100% oxygen Induction Type: IV induction Ventilation: Mask ventilation without difficulty and Oral airway inserted - appropriate to patient size Laryngoscope Size: Mac and 4 Grade View: Grade I Tube type: Oral Tube size: 7.5 mm Number of attempts: 1 Airway Equipment and Method: Stylet and Oral airway Placement Confirmation: ETT inserted through vocal cords under direct vision, positive ETCO2 and breath sounds checked- equal and bilateral Tube secured with: Tape Dental Injury: Teeth and Oropharynx as per pre-operative assessment  Comments: Intubated by SRNAKennedy Bucker

## 2022-09-28 NOTE — Progress Notes (Signed)
BP (!) 146/94   Pulse 63   Temp (!) 97.4 F (36.3 C) (Oral)   Resp 17   Ht 5\' 11"  (1.803 m)   Wt 86.2 kg   SpO2 96%   BMI 26.50 kg/m  Alert and oriented x 4, normal strength To the OR for hardware revision .

## 2022-09-28 NOTE — Plan of Care (Signed)

## 2022-09-28 NOTE — Anesthesia Postprocedure Evaluation (Signed)
Anesthesia Post Note  Patient: Rodney [name]  Procedure(s) Performed: LUMBAR TWO-LUMBAR FIVE SEGMENTAL PEDICLE FIXATION, REPOSITION OF INTERBODY CAGES, REMOVAL AND REPLACEMENT OF SPINOUS PROCESS PLATE (Spine Lumbar)     Patient location during evaluation: PACU Anesthesia Type: General Level of consciousness: awake and alert Pain management: pain level controlled Vital Signs Assessment: post-procedure vital signs reviewed and stable Respiratory status: spontaneous breathing, nonlabored ventilation, respiratory function stable and patient connected to nasal cannula oxygen Cardiovascular status: blood pressure returned to baseline and stable Postop Assessment: no apparent nausea or vomiting Anesthetic complications: no  No notable events documented.  Last Vitals:  Vitals:   09/28/22 1930 09/28/22 1945  BP: 121/70 126/88  Pulse: 72 73  Resp: 14 14  Temp:  36.5 C  SpO2: 95% 93%    Last Pain:  Vitals:   09/28/22 1945  TempSrc:   PainSc: 4                  Axelle Szwed S

## 2022-09-28 NOTE — Op Note (Signed)
09/28/2022  7:03 PM  PATIENT:  Rodney [name]  82 y.o. male  PRE-OPERATIVE DIAGNOSIS:  Painful hardware  POST-OPERATIVE DIAGNOSIS:  Painful hardware  PROCEDURE:  Procedure(s): LUMBAR TWO-LUMBAR FIVE SEGMENTAL PEDICLE FIXATION, REPOSITION OF INTERBODY CAGES, REMOVAL AND REPLACEMENT OF SPINOUS PROCESS PLATE  SURGEON: Surgeon(s): Coletta Memos, MD  ASSISTANTS:none  ANESTHESIA:   general  EBL:  No intake/output data recorded.  BLOOD ADMINISTERED:none  CELL SAVER GIVEN:not used  COUNT:per nursing  DRAINS: Urinary Catheter (Foley)   SPECIMEN:  No Specimen  DICTATION: Rodney [name] was taken to the operating room, intubated, and placed under a general anesthetic without difficulty. A foley catheter was placed under sterile conditions. He was positioned prone on the [place] table with all pressure points properly padded. His lumbar region was prepped and draped in a sterile manner.  I opened the previous incision and dissected to the thoracolumbar fascia. I exposed the spinous process plate which no longer had any purchase on the spinous process of L5. I removed the spinous process plate.  I dissected lateral to the thecal sac on both the right and left sides to expose the interbody cages. I removed both cages without complication.  I exposed the superior pedicle screw at L2 on the left side, removed the locking cap, and also at L4. I removed the rod easily. I placed a pedicle screw at L5 on the right hand side with fluoroscopic guidance. I placed a medtronic solera 6.94mm x50 mm screw.  I replaced the cages pushing them further into the disc space before expanding them on the right and left sides. This was performed with fluoroscopic imaging.  I placed allograft bearing proteus 1 into the disc space.  I placed a new rod from L2 to L5 and secured it with locking caps on the left side.  I replaced the spinous process plate further anterior on the spinous process of both L4 and  L5. I secured the plate. I irrigated and closed. I approximated the thoracolumbar fascia, subcutaneous and subcuticular planes with vicryl sutures.  I placed a sterile dressing. He was rolled onto the stretcher and extubated. He was moving all extremities.  PLAN OF CARE: Admit to inpatient   PATIENT DISPOSITION:  PACU - hemodynamically stable.   Delay start of Pharmacological VTE agent (>24hrs) due to surgical blood loss or risk of bleeding:  yes

## 2022-09-28 NOTE — Progress Notes (Signed)
ANTICOAGULATION CONSULT NOTE - Initial Consult  Pharmacy Consult for Heparin bridge while Warfarin on hold Indication: History of PE  Allergies  Allergen Reactions   Blood-Group Specific Substance     Must be Leukocyte Reduced and Irradiated due to stem cell transplant.    Prednisone Other (See Comments)    Agitation  Anger    Patient Measurements: Height: 5\' 11"  (180.3 cm) Weight: 86.2 kg (190 lb 0.6 oz) IBW/kg (Calculated) : 75.3 Heparin Dosing Weight: total weight  Vital Signs: Temp: 97.5 F (36.4 C) (07/26 0713) Temp Source: Oral (07/26 0713) BP: 116/74 (07/26 0713) Pulse Rate: 58 (07/26 0713)  Labs: Recent Labs    09/26/22 2112 09/27/22 0206 09/27/22 2053 09/28/22 0248  HGB  --  12.6* 12.3* 12.2*  HCT  --  38.5* 36.3* 36.6*  PLT  --  192 194 192  APTT  --   --   --  74*  LABPROT  --   --   --  15.1  INR  --   --   --  1.2  HEPARINUNFRC 0.33 0.40  --  0.45    Estimated Creatinine Clearance: 60.9 mL/min (by C-G formula based on SCr of 1.03 mg/dL).   Medical History: Past Medical History:  Diagnosis Date   Anemia 02-16-11   01-05-11- post surgery-Transfusions x 4 units   Arthritis    Bone pain 02/18/2013   Cellulitis    Clotting disorder (HCC)    Complication of anesthesia 02-16-11   Pt. speaks of awakening during the surgery from back  surgery   Degenerative disc disease 02-16-11   01-05-11 L3 fusion done   Esophageal reflux 05/31/2008   Qualifier: Diagnosis of  By: Lovell Sheehan MD, Balinda Quails    Hernia 02-16-11   left inguinal hernia at present   Hypothyroidism 02/01/2014   Multiple myeloma 02-16-11   suspected tumor  left sacral   Pancreatitis    Personal history of traumatic fracture 07/10/2012   Prostate cancer [facility])    Pulmonary embolism (HCC)    Second degree atrioventricular block 11/26/2012   Shortness of breath 02-16-11   hx. Pulmonary emboli x2 (9 yrs/ 3'12 -last)    Medications:  Scheduled:   calcium carbonate  500 mg Oral Daily    citalopram  10 mg Oral QHS   vitamin B-12  3,000 mcg Oral Daily   fesoterodine  4 mg Oral Daily   gabapentin  600 mg Oral QHS   levothyroxine  112 mcg Oral QAC breakfast   loratadine  10 mg Oral Daily   mirabegron ER  25 mg Oral Daily   pantoprazole  80 mg Oral Daily   potassium chloride SA  20 mEq Oral QHS   Infusions:   0.9 % NaCl with KCl 20 mEq / L 50 mL/hr at 09/28/22 1020   heparin 1,400 Units/hr (09/28/22 0524)   PRN: acetaminophen **OR** acetaminophen, HYDROcodone-acetaminophen, morphine injection, nitroGLYCERIN, ondansetron, polyvinyl alcohol, tiZANidine, triamcinolone  Assessment: 82 yo male on chronic warfarin for hx PE admitted for heparin bridge prior to a hardware revision in the lumbar spine.   Heparin continues to be therapeutic. Plan for OR today. We will f/u with resuming post procedure.   Dose PTA: 2.5mg  daily except 5mg  MWF - last dose taken 7/19 INR 1.5  Goal of Therapy:  Heparin level 0.3 - 0.7 units/ml Monitor platelets by anticoagulation protocol: Yes   Plan:  Continue heparin infusion at 1400 units/hr Daily heparin level and CBC F/u resuming post OR  Ulyses Southward, PharmD, BCIDP, AAHIVP, CPP Infectious Disease Pharmacist 09/28/2022 11:09 AM

## 2022-09-28 NOTE — Transfer of Care (Signed)
Immediate Anesthesia Transfer of Care Note  Patient: Rodney [name]  Procedure(s) Performed: LUMBAR TWO-LUMBAR FIVE SEGMENTAL PEDICLE FIXATION, REPOSITION OF INTERBODY CAGES, REMOVAL AND REPLACEMENT OF SPINOUS PROCESS PLATE (Spine Lumbar)  Patient Location: PACU  Anesthesia Type:General  Level of Consciousness: awake and patient cooperative  Airway & Oxygen Therapy: Patient Spontanous Breathing and Patient connected to face mask oxygen  Post-op Assessment: Report given to RN and Post -op Vital signs reviewed and stable  Post vital signs: Reviewed and stable  Last Vitals:  Vitals Value Taken Time  BP 135/90 09/28/22 1840  Temp 36.1 C 09/28/22 1840  Pulse 69 09/28/22 1844  Resp 14 09/28/22 1844  SpO2 97 % 09/28/22 1844  Vitals shown include unfiled device data.  Last Pain:  Vitals:   09/28/22 1840  TempSrc:   PainSc: Asleep      Patients Stated Pain Goal: 0 (09/28/22 1418)  Complications: No notable events documented.

## 2022-09-28 NOTE — Anesthesia Preprocedure Evaluation (Addendum)
Anesthesia Evaluation  Patient identified by MRN, date of birth, ID band Patient awake    Reviewed: Allergy & Precautions, NPO status , Patient's Chart, lab work & pertinent test results  History of Anesthesia Complications Negative for: history of anesthetic complications  Airway Mallampati: II  TM Distance: >3 FB Neck ROM: Full    Dental  (+) Dental Advisory Given   Pulmonary former smoker, PE   breath sounds clear to auscultation       Cardiovascular (-) hypertension(-) angina  Rhythm:Regular Rate:Normal     Neuro/Psych    Depression    Back pain    GI/Hepatic Neg liver ROS,GERD  Medicated and Controlled,,  Endo/Other  Hypothyroidism    Renal/GU negative Renal ROS     Musculoskeletal  (+) Arthritis ,    Abdominal   Peds  Hematology Coumadin Plt 192k, Hb 12.2, INR 1.2 H/o myeloma: now s/p stem cell transplant   Anesthesia Other Findings H/o prostate cancer  Reproductive/Obstetrics                             Anesthesia Physical Anesthesia Plan  ASA: 3  Anesthesia Plan: General   Post-op Pain Management: Tylenol PO (pre-op)*   Induction: Intravenous  PONV Risk Score and Plan: 2 and Ondansetron and Dexamethasone  Airway Management Planned: Oral ETT  Additional Equipment: None  Intra-op Plan:   Post-operative Plan: Extubation in OR  Informed Consent: I have reviewed the patients History and Physical, chart, labs and discussed the procedure including the risks, benefits and alternatives for the proposed anesthesia with the patient or authorized representative who has indicated his/her understanding and acceptance.     Dental advisory given  Plan Discussed with: CRNA and Surgeon  Anesthesia Plan Comments:        Anesthesia Quick Evaluation

## 2022-09-29 NOTE — Plan of Care (Signed)

## 2022-09-29 NOTE — Progress Notes (Signed)
Patient ID: Rodney [name], male   DOB: 1941-01-15, 82 y.o.   MRN: [id number] BP 105/73 (BP Location: Right Arm)   Pulse 90   Temp 98.8 F (37.1 C)   Resp 20   Ht 5\' 11"  (1.803 m)   Wt 86.2 kg   SpO2 98%   BMI 26.50 kg/m  Alert, oriented x4 Moving all extremities well Wound is clean, dry, no signs of infection.  Possible discharge

## 2022-09-30 MED ORDER — TIZANIDINE HCL 4 MG PO TABS: 4.0000 mg | ORAL_TABLET | Freq: Four times a day (QID) | ORAL | 0 refills | Status: AC | PRN

## 2022-09-30 MED ORDER — TRIAMCINOLONE ACETONIDE 55 MCG/ACT NA AERO: 2.0000 | INHALATION_SPRAY | Freq: Every day | NASAL | 12 refills | Status: AC | PRN

## 2022-09-30 MED ORDER — HYDROCODONE-ACETAMINOPHEN 5-325 MG PO TABS: 1.0000 | ORAL_TABLET | ORAL | 0 refills | Status: AC | PRN

## 2022-09-30 NOTE — Discharge Summary (Signed)
Physician Discharge Summary  Patient ID: IVY PLYMEL MRN: [id number] DOB/AGE: 80-Dec-1942 80 y.o.  Admit date: 09/24/2022 Discharge date: 09/30/2022  Admission Diagnoses:hardware failure cage displacement, spinous process plate detachment  Discharge Diagnoses:  Principal Problem:   Hardware failure of anterior column of spine [facility])   Discharged Condition: good  Hospital Course: Mr. Hollie was admitted for a heparin bridge due to history of VTE. Underwent operative procedure on 7/26. I removed the expandable half dome x cages. I removed the spinous process plate which was no longer attached to the L5 spinous process. I placed a new pedicle screw at L5 on the left. I removed the rod from the screws at L2 and L4 on the left, replacing it with a longer rod connecting L2,4, and now L5. I placed the cages again, and the spinous process plate. He has done well post op. I have instructed him to start the coumadin at his regular dose. He is ambulating well. Wound is clean, dry, without signs of infection  Treatments: surgery: LUMBAR TWO-LUMBAR FIVE SEGMENTAL PEDICLE FIXATION, REPOSITION OF INTERBODY CAGES, REMOVAL AND REPLACEMENT OF SPINOUS PROCESS PLATE    Discharge Exam: Blood pressure (!) 74/60, pulse 77, temperature 97.9 F (36.6 C), temperature source Oral, resp. rate 17, height 5\' 11"  (1.803 m), weight 86.2 kg, SpO2 97%. General appearance: alert, cooperative, appears stated age, and no distress  Disposition: Discharge disposition: 01-Home or Self Care      Painful hardware  Allergies as of 09/30/2022       Reactions   Blood-group Specific Substance    Must be Leukocyte Reduced and Irradiated due to stem cell transplant.    Prednisone Other (See Comments)   Agitation  Anger        Medication List     TAKE these medications    acetaminophen 500 MG tablet Commonly known as: TYLENOL Take 500-1,000 mg by mouth every 6 (six) hours as needed for headache (pain).    B-12 3000 MCG Caps Take 3,000 mcg by mouth daily.   Biotin 5 MG Caps Take 5 mg by mouth at bedtime.   CALTRATE 600+D PO Take 1 tablet by mouth 2 (two) times daily.   cetirizine 10 MG tablet Commonly known as: ZYRTEC Take 10 mg by mouth at bedtime as needed for allergies. At night   citalopram 10 MG tablet Commonly known as: CELEXA Take 1 tablet (10 mg total) by mouth daily. What changed: when to take this   ESTER-C PO Take 1,000 mg by mouth daily.   gabapentin 300 MG capsule Commonly known as: NEURONTIN TAKE 3 CAPSULES BY MOUTH AT BEDTIME**PATIENT NEEDS APT FOR FURTHER REFILLS OF THIS MEDICATION** What changed:  how much to take how to take this when to take this additional instructions   HYDROcodone-acetaminophen 5-325 MG tablet Commonly known as: NORCO/VICODIN Take 1-2 tablets by mouth every 4 (four) hours as needed for moderate pain.   levothyroxine 112 MCG tablet Commonly known as: SYNTHROID Take 112 mcg by mouth daily before breakfast.   lidocaine 5 % ointment Commonly known as: XYLOCAINE Apply 1 Application topically 3 (three) times daily as needed.   multivitamin with minerals Tabs tablet Take 1 tablet by mouth daily.   Myrbetriq 25 MG Tb24 tablet Generic drug: mirabegron ER TAKE 1 TABLET BY MOUTH EVERY DAY   NASACORT ALLERGY 24HR NA Place 2 sprays into the nose at bedtime as needed (congestion).   triamcinolone 55 MCG/ACT Aero nasal inhaler Commonly known as: Nasacort Allergy 24HR Place  2 sprays into the nose daily as needed (allergies).   nitroGLYCERIN 0.2 mg/hr patch Commonly known as: NITRODUR - Dosed in mg/24 hr PLACE 1/4 TO 1/2 OF PATCH OVER AFFECTED REGION. REMOVE AND REPLACE ONCE DAILY. SLIGHTLY ALTER SKIN PLACEMENT DAILY What changed:  how much to take how to take this when to take this additional instructions   omeprazole 20 MG capsule Commonly known as: PRILOSEC Take 1 capsule (20 mg total) by mouth 2 (two) times daily.    ondansetron 4 MG tablet Commonly known as: ZOFRAN Take 4 mg by mouth every 8 (eight) hours as needed for nausea or vomiting.   oxycodone 5 MG capsule Commonly known as: OXY-IR Take 5 mg by mouth every 6 (six) hours as needed.   potassium chloride SA 20 MEQ tablet Commonly known as: KLOR-CON M TAKE 1 TABLET BY MOUTH EVERY DAY What changed: when to take this   ROBITUSSIN MAXIMUM STRENGTH PO Take 2.5 mLs by mouth daily as needed.   tiZANidine 4 MG tablet Commonly known as: ZANAFLEX Take 1 tablet (4 mg total) by mouth every 6 (six) hours as needed for muscle spasms. What changed: Another medication with the same name was added. Make sure you understand how and when to take each.   tiZANidine 4 MG tablet Commonly known as: ZANAFLEX Take 1 tablet (4 mg total) by mouth every 6 (six) hours as needed for muscle spasms. What changed: You were already taking a medication with the same name, and this prescription was added. Make sure you understand how and when to take each.   tolterodine 4 MG 24 hr capsule Commonly known as: DETROL LA TAKE 1 CAPSULE BY MOUTH EVERY DAY What changed:  how much to take when to take this   Tums Ultra 1000 1000 MG chewable tablet Generic drug: calcium elemental as carbonate Chew 1,000-2,000 mg by mouth daily as needed for heartburn.   Vitamin D 1000 units capsule Take 1,000 Units by mouth 2 (two) times daily.   warfarin 2.5 MG tablet Commonly known as: COUMADIN Take 2.5-5 mg by mouth at bedtime. Take 5 mg on Monday, Wednesday & Friday and 2.5 mg all other days.        Follow-up Information     Coletta Memos, MD Follow up.   Specialty: Neurosurgery Why: keep your scheduled appointment Contact information: [address] [place] [address] [phone]                 Signed: Coletta Memos 09/30/2022, 2:18 PM

## 2022-09-30 NOTE — Discharge Instructions (Signed)
Restart coumadin at your regular dose today.  Spinal Fusion Care After Refer to this sheet in the next few weeks. These instructions provide you with information on caring for yourself after your procedure. Your caregiver may also give you more specific instructions. Your treatment has been planned according to current medical practices, but problems sometimes occur. Call your caregiver if you have any problems or questions after your procedure. HOME CARE INSTRUCTIONS  Take whatever pain medicine has been prescribed by your caregiver. Do not take over-the-counter pain medicine unless directed otherwise by your caregiver.  Do not drive if you are taking narcotic pain medicines.  Change your bandage (dressing) if necessary or as directed by your caregiver.  You may shower. The wound may get wet, simply pat the area dry. It will take ~2 weeks for the glue to peel off. If you have been prescribed medicine to prevent your blood from clotting, follow the directions carefully.  Check the area around your incision often. Look for redness and swelling. Also, look for anything leaking from your wound. You can use a mirror or have a family member inspect your incision if it is in a place where it is difficult for you to see.  Ask your caregiver what activities you should avoid and for how long.  Walk as much as possible.  Do not lift anything heavier than 5 lbs until your caregiver says it is safe.  Do not twist or bend for a few weeks. Try not to pull on things. Avoid sitting for long periods of time. Change positions at least every hour.

## 2022-09-30 NOTE — Plan of Care (Signed)

## 2022-10-02 DIAGNOSIS — D689 Coagulation defect, unspecified: Secondary | ICD-10-CM | POA: Diagnosis not present

## 2022-10-02 DIAGNOSIS — Z7901 Long term (current) use of anticoagulants: Secondary | ICD-10-CM | POA: Diagnosis not present

## 2022-10-04 DIAGNOSIS — M160 Bilateral primary osteoarthritis of hip: Secondary | ICD-10-CM | POA: Diagnosis not present

## 2022-10-04 DIAGNOSIS — M47816 Spondylosis without myelopathy or radiculopathy, lumbar region: Secondary | ICD-10-CM | POA: Diagnosis not present

## 2022-10-04 DIAGNOSIS — Z8579 Personal history of other malignant neoplasms of lymphoid, hematopoietic and related tissues: Secondary | ICD-10-CM | POA: Diagnosis not present

## 2022-10-04 DIAGNOSIS — J309 Allergic rhinitis, unspecified: Secondary | ICD-10-CM | POA: Diagnosis not present

## 2022-10-04 DIAGNOSIS — G8929 Other chronic pain: Secondary | ICD-10-CM | POA: Diagnosis not present

## 2022-10-04 DIAGNOSIS — E559 Vitamin D deficiency, unspecified: Secondary | ICD-10-CM | POA: Diagnosis not present

## 2022-10-04 DIAGNOSIS — M5441 Lumbago with sciatica, right side: Secondary | ICD-10-CM | POA: Diagnosis not present

## 2022-10-04 DIAGNOSIS — Z85828 Personal history of other malignant neoplasm of skin: Secondary | ICD-10-CM | POA: Diagnosis not present

## 2022-10-04 DIAGNOSIS — M4316 Spondylolisthesis, lumbar region: Secondary | ICD-10-CM | POA: Diagnosis not present

## 2022-10-04 DIAGNOSIS — N3281 Overactive bladder: Secondary | ICD-10-CM | POA: Diagnosis not present

## 2022-10-04 DIAGNOSIS — E039 Hypothyroidism, unspecified: Secondary | ICD-10-CM | POA: Diagnosis not present

## 2022-10-04 DIAGNOSIS — G5603 Carpal tunnel syndrome, bilateral upper limbs: Secondary | ICD-10-CM | POA: Diagnosis not present

## 2022-10-04 DIAGNOSIS — M48061 Spinal stenosis, lumbar region without neurogenic claudication: Secondary | ICD-10-CM | POA: Diagnosis not present

## 2022-10-04 DIAGNOSIS — Z87891 Personal history of nicotine dependence: Secondary | ICD-10-CM | POA: Diagnosis not present

## 2022-10-04 DIAGNOSIS — Z7901 Long term (current) use of anticoagulants: Secondary | ICD-10-CM | POA: Diagnosis not present

## 2022-10-04 DIAGNOSIS — M5136 Other intervertebral disc degeneration, lumbar region: Secondary | ICD-10-CM | POA: Diagnosis not present

## 2022-10-04 DIAGNOSIS — Z79891 Long term (current) use of opiate analgesic: Secondary | ICD-10-CM | POA: Diagnosis not present

## 2022-10-04 DIAGNOSIS — Z86711 Personal history of pulmonary embolism: Secondary | ICD-10-CM | POA: Diagnosis not present

## 2022-10-04 DIAGNOSIS — E785 Hyperlipidemia, unspecified: Secondary | ICD-10-CM | POA: Diagnosis not present

## 2022-10-04 DIAGNOSIS — Z4789 Encounter for other orthopedic aftercare: Secondary | ICD-10-CM | POA: Diagnosis not present

## 2022-10-04 DIAGNOSIS — Z981 Arthrodesis status: Secondary | ICD-10-CM | POA: Diagnosis not present

## 2022-10-04 DIAGNOSIS — Z9484 Stem cells transplant status: Secondary | ICD-10-CM | POA: Diagnosis not present

## 2022-10-10 ENCOUNTER — Other Ambulatory Visit (HOSPITAL_BASED_OUTPATIENT_CLINIC_OR_DEPARTMENT_OTHER): Payer: Self-pay | Admitting: Family Medicine

## 2022-10-12 DIAGNOSIS — M5441 Lumbago with sciatica, right side: Secondary | ICD-10-CM | POA: Diagnosis not present

## 2022-10-12 DIAGNOSIS — E039 Hypothyroidism, unspecified: Secondary | ICD-10-CM | POA: Diagnosis not present

## 2022-10-12 DIAGNOSIS — Z85828 Personal history of other malignant neoplasm of skin: Secondary | ICD-10-CM | POA: Diagnosis not present

## 2022-10-12 DIAGNOSIS — M48061 Spinal stenosis, lumbar region without neurogenic claudication: Secondary | ICD-10-CM | POA: Diagnosis not present

## 2022-10-12 DIAGNOSIS — Z7901 Long term (current) use of anticoagulants: Secondary | ICD-10-CM | POA: Diagnosis not present

## 2022-10-12 DIAGNOSIS — G8929 Other chronic pain: Secondary | ICD-10-CM | POA: Diagnosis not present

## 2022-10-12 DIAGNOSIS — M47816 Spondylosis without myelopathy or radiculopathy, lumbar region: Secondary | ICD-10-CM | POA: Diagnosis not present

## 2022-10-12 DIAGNOSIS — Z87891 Personal history of nicotine dependence: Secondary | ICD-10-CM | POA: Diagnosis not present

## 2022-10-12 DIAGNOSIS — Z86711 Personal history of pulmonary embolism: Secondary | ICD-10-CM | POA: Diagnosis not present

## 2022-10-12 DIAGNOSIS — E559 Vitamin D deficiency, unspecified: Secondary | ICD-10-CM | POA: Diagnosis not present

## 2022-10-12 DIAGNOSIS — M4316 Spondylolisthesis, lumbar region: Secondary | ICD-10-CM | POA: Diagnosis not present

## 2022-10-12 DIAGNOSIS — Z79891 Long term (current) use of opiate analgesic: Secondary | ICD-10-CM | POA: Diagnosis not present

## 2022-10-12 DIAGNOSIS — M5136 Other intervertebral disc degeneration, lumbar region: Secondary | ICD-10-CM | POA: Diagnosis not present

## 2022-10-12 DIAGNOSIS — Z981 Arthrodesis status: Secondary | ICD-10-CM | POA: Diagnosis not present

## 2022-10-12 DIAGNOSIS — M160 Bilateral primary osteoarthritis of hip: Secondary | ICD-10-CM | POA: Diagnosis not present

## 2022-10-12 DIAGNOSIS — Z8579 Personal history of other malignant neoplasms of lymphoid, hematopoietic and related tissues: Secondary | ICD-10-CM | POA: Diagnosis not present

## 2022-10-12 DIAGNOSIS — J309 Allergic rhinitis, unspecified: Secondary | ICD-10-CM | POA: Diagnosis not present

## 2022-10-12 DIAGNOSIS — Z4789 Encounter for other orthopedic aftercare: Secondary | ICD-10-CM | POA: Diagnosis not present

## 2022-10-12 DIAGNOSIS — E785 Hyperlipidemia, unspecified: Secondary | ICD-10-CM | POA: Diagnosis not present

## 2022-10-12 DIAGNOSIS — Z9484 Stem cells transplant status: Secondary | ICD-10-CM | POA: Diagnosis not present

## 2022-10-12 DIAGNOSIS — G5603 Carpal tunnel syndrome, bilateral upper limbs: Secondary | ICD-10-CM | POA: Diagnosis not present

## 2022-10-12 DIAGNOSIS — N3281 Overactive bladder: Secondary | ICD-10-CM | POA: Diagnosis not present

## 2022-10-16 DIAGNOSIS — E559 Vitamin D deficiency, unspecified: Secondary | ICD-10-CM | POA: Diagnosis not present

## 2022-10-16 DIAGNOSIS — M48061 Spinal stenosis, lumbar region without neurogenic claudication: Secondary | ICD-10-CM | POA: Diagnosis not present

## 2022-10-16 DIAGNOSIS — Z86711 Personal history of pulmonary embolism: Secondary | ICD-10-CM | POA: Diagnosis not present

## 2022-10-16 DIAGNOSIS — Z7901 Long term (current) use of anticoagulants: Secondary | ICD-10-CM | POA: Diagnosis not present

## 2022-10-16 DIAGNOSIS — M5441 Lumbago with sciatica, right side: Secondary | ICD-10-CM | POA: Diagnosis not present

## 2022-10-16 DIAGNOSIS — G8929 Other chronic pain: Secondary | ICD-10-CM | POA: Diagnosis not present

## 2022-10-16 DIAGNOSIS — N3281 Overactive bladder: Secondary | ICD-10-CM | POA: Diagnosis not present

## 2022-10-16 DIAGNOSIS — M160 Bilateral primary osteoarthritis of hip: Secondary | ICD-10-CM | POA: Diagnosis not present

## 2022-10-16 DIAGNOSIS — G5603 Carpal tunnel syndrome, bilateral upper limbs: Secondary | ICD-10-CM | POA: Diagnosis not present

## 2022-10-16 DIAGNOSIS — J309 Allergic rhinitis, unspecified: Secondary | ICD-10-CM | POA: Diagnosis not present

## 2022-10-16 DIAGNOSIS — Z981 Arthrodesis status: Secondary | ICD-10-CM | POA: Diagnosis not present

## 2022-10-16 DIAGNOSIS — M47816 Spondylosis without myelopathy or radiculopathy, lumbar region: Secondary | ICD-10-CM | POA: Diagnosis not present

## 2022-10-16 DIAGNOSIS — Z87891 Personal history of nicotine dependence: Secondary | ICD-10-CM | POA: Diagnosis not present

## 2022-10-16 DIAGNOSIS — E785 Hyperlipidemia, unspecified: Secondary | ICD-10-CM | POA: Diagnosis not present

## 2022-10-16 DIAGNOSIS — M5136 Other intervertebral disc degeneration, lumbar region: Secondary | ICD-10-CM | POA: Diagnosis not present

## 2022-10-16 DIAGNOSIS — Z9484 Stem cells transplant status: Secondary | ICD-10-CM | POA: Diagnosis not present

## 2022-10-16 DIAGNOSIS — E039 Hypothyroidism, unspecified: Secondary | ICD-10-CM | POA: Diagnosis not present

## 2022-10-16 DIAGNOSIS — Z79891 Long term (current) use of opiate analgesic: Secondary | ICD-10-CM | POA: Diagnosis not present

## 2022-10-16 DIAGNOSIS — M4316 Spondylolisthesis, lumbar region: Secondary | ICD-10-CM | POA: Diagnosis not present

## 2022-10-16 DIAGNOSIS — Z4789 Encounter for other orthopedic aftercare: Secondary | ICD-10-CM | POA: Diagnosis not present

## 2022-10-16 DIAGNOSIS — Z8579 Personal history of other malignant neoplasms of lymphoid, hematopoietic and related tissues: Secondary | ICD-10-CM | POA: Diagnosis not present

## 2022-10-16 DIAGNOSIS — Z85828 Personal history of other malignant neoplasm of skin: Secondary | ICD-10-CM | POA: Diagnosis not present

## 2022-10-18 DIAGNOSIS — M5136 Other intervertebral disc degeneration, lumbar region: Secondary | ICD-10-CM | POA: Diagnosis not present

## 2022-10-23 DIAGNOSIS — M4316 Spondylolisthesis, lumbar region: Secondary | ICD-10-CM | POA: Diagnosis not present

## 2022-10-23 DIAGNOSIS — E785 Hyperlipidemia, unspecified: Secondary | ICD-10-CM | POA: Diagnosis not present

## 2022-10-23 DIAGNOSIS — Z86711 Personal history of pulmonary embolism: Secondary | ICD-10-CM | POA: Diagnosis not present

## 2022-10-23 DIAGNOSIS — Z87891 Personal history of nicotine dependence: Secondary | ICD-10-CM | POA: Diagnosis not present

## 2022-10-23 DIAGNOSIS — M160 Bilateral primary osteoarthritis of hip: Secondary | ICD-10-CM | POA: Diagnosis not present

## 2022-10-23 DIAGNOSIS — N3281 Overactive bladder: Secondary | ICD-10-CM | POA: Diagnosis not present

## 2022-10-23 DIAGNOSIS — J309 Allergic rhinitis, unspecified: Secondary | ICD-10-CM | POA: Diagnosis not present

## 2022-10-23 DIAGNOSIS — Z85828 Personal history of other malignant neoplasm of skin: Secondary | ICD-10-CM | POA: Diagnosis not present

## 2022-10-23 DIAGNOSIS — Z9484 Stem cells transplant status: Secondary | ICD-10-CM | POA: Diagnosis not present

## 2022-10-23 DIAGNOSIS — M5136 Other intervertebral disc degeneration, lumbar region: Secondary | ICD-10-CM | POA: Diagnosis not present

## 2022-10-23 DIAGNOSIS — G5603 Carpal tunnel syndrome, bilateral upper limbs: Secondary | ICD-10-CM | POA: Diagnosis not present

## 2022-10-23 DIAGNOSIS — M47816 Spondylosis without myelopathy or radiculopathy, lumbar region: Secondary | ICD-10-CM | POA: Diagnosis not present

## 2022-10-23 DIAGNOSIS — M48061 Spinal stenosis, lumbar region without neurogenic claudication: Secondary | ICD-10-CM | POA: Diagnosis not present

## 2022-10-23 DIAGNOSIS — Z79891 Long term (current) use of opiate analgesic: Secondary | ICD-10-CM | POA: Diagnosis not present

## 2022-10-23 DIAGNOSIS — Z8579 Personal history of other malignant neoplasms of lymphoid, hematopoietic and related tissues: Secondary | ICD-10-CM | POA: Diagnosis not present

## 2022-10-23 DIAGNOSIS — M5441 Lumbago with sciatica, right side: Secondary | ICD-10-CM | POA: Diagnosis not present

## 2022-10-23 DIAGNOSIS — E559 Vitamin D deficiency, unspecified: Secondary | ICD-10-CM | POA: Diagnosis not present

## 2022-10-23 DIAGNOSIS — G8929 Other chronic pain: Secondary | ICD-10-CM | POA: Diagnosis not present

## 2022-10-23 DIAGNOSIS — E039 Hypothyroidism, unspecified: Secondary | ICD-10-CM | POA: Diagnosis not present

## 2022-10-23 DIAGNOSIS — Z7901 Long term (current) use of anticoagulants: Secondary | ICD-10-CM | POA: Diagnosis not present

## 2022-10-23 DIAGNOSIS — T84296D Other mechanical complication of internal fixation device of vertebrae, subsequent encounter: Secondary | ICD-10-CM | POA: Diagnosis not present

## 2022-10-24 DIAGNOSIS — Z7901 Long term (current) use of anticoagulants: Secondary | ICD-10-CM | POA: Diagnosis not present

## 2022-10-24 DIAGNOSIS — D689 Coagulation defect, unspecified: Secondary | ICD-10-CM | POA: Diagnosis not present

## 2022-10-25 DIAGNOSIS — C9001 Multiple myeloma in remission: Secondary | ICD-10-CM | POA: Diagnosis not present

## 2022-10-25 DIAGNOSIS — R6889 Other general symptoms and signs: Secondary | ICD-10-CM | POA: Diagnosis not present

## 2022-10-25 DIAGNOSIS — G5603 Carpal tunnel syndrome, bilateral upper limbs: Secondary | ICD-10-CM | POA: Diagnosis not present

## 2022-10-25 DIAGNOSIS — C9000 Multiple myeloma not having achieved remission: Secondary | ICD-10-CM | POA: Diagnosis not present

## 2022-11-02 DIAGNOSIS — N3281 Overactive bladder: Secondary | ICD-10-CM | POA: Diagnosis not present

## 2022-11-02 DIAGNOSIS — M4316 Spondylolisthesis, lumbar region: Secondary | ICD-10-CM | POA: Diagnosis not present

## 2022-11-02 DIAGNOSIS — T84296D Other mechanical complication of internal fixation device of vertebrae, subsequent encounter: Secondary | ICD-10-CM | POA: Diagnosis not present

## 2022-11-02 DIAGNOSIS — E039 Hypothyroidism, unspecified: Secondary | ICD-10-CM | POA: Diagnosis not present

## 2022-11-02 DIAGNOSIS — M5136 Other intervertebral disc degeneration, lumbar region: Secondary | ICD-10-CM | POA: Diagnosis not present

## 2022-11-02 DIAGNOSIS — G8929 Other chronic pain: Secondary | ICD-10-CM | POA: Diagnosis not present

## 2022-11-02 DIAGNOSIS — M5441 Lumbago with sciatica, right side: Secondary | ICD-10-CM | POA: Diagnosis not present

## 2022-11-02 DIAGNOSIS — G5603 Carpal tunnel syndrome, bilateral upper limbs: Secondary | ICD-10-CM | POA: Diagnosis not present

## 2022-11-02 DIAGNOSIS — Z87891 Personal history of nicotine dependence: Secondary | ICD-10-CM | POA: Diagnosis not present

## 2022-11-02 DIAGNOSIS — M160 Bilateral primary osteoarthritis of hip: Secondary | ICD-10-CM | POA: Diagnosis not present

## 2022-11-02 DIAGNOSIS — J309 Allergic rhinitis, unspecified: Secondary | ICD-10-CM | POA: Diagnosis not present

## 2022-11-02 DIAGNOSIS — Z86711 Personal history of pulmonary embolism: Secondary | ICD-10-CM | POA: Diagnosis not present

## 2022-11-02 DIAGNOSIS — M48061 Spinal stenosis, lumbar region without neurogenic claudication: Secondary | ICD-10-CM | POA: Diagnosis not present

## 2022-11-02 DIAGNOSIS — Z7901 Long term (current) use of anticoagulants: Secondary | ICD-10-CM | POA: Diagnosis not present

## 2022-11-02 DIAGNOSIS — Z85828 Personal history of other malignant neoplasm of skin: Secondary | ICD-10-CM | POA: Diagnosis not present

## 2022-11-02 DIAGNOSIS — E785 Hyperlipidemia, unspecified: Secondary | ICD-10-CM | POA: Diagnosis not present

## 2022-11-02 DIAGNOSIS — Z9484 Stem cells transplant status: Secondary | ICD-10-CM | POA: Diagnosis not present

## 2022-11-02 DIAGNOSIS — Z79891 Long term (current) use of opiate analgesic: Secondary | ICD-10-CM | POA: Diagnosis not present

## 2022-11-02 DIAGNOSIS — Z8579 Personal history of other malignant neoplasms of lymphoid, hematopoietic and related tissues: Secondary | ICD-10-CM | POA: Diagnosis not present

## 2022-11-02 DIAGNOSIS — E559 Vitamin D deficiency, unspecified: Secondary | ICD-10-CM | POA: Diagnosis not present

## 2022-11-02 DIAGNOSIS — M47816 Spondylosis without myelopathy or radiculopathy, lumbar region: Secondary | ICD-10-CM | POA: Diagnosis not present

## 2022-11-09 DIAGNOSIS — J309 Allergic rhinitis, unspecified: Secondary | ICD-10-CM | POA: Diagnosis not present

## 2022-11-09 DIAGNOSIS — Z86711 Personal history of pulmonary embolism: Secondary | ICD-10-CM | POA: Diagnosis not present

## 2022-11-09 DIAGNOSIS — M48061 Spinal stenosis, lumbar region without neurogenic claudication: Secondary | ICD-10-CM | POA: Diagnosis not present

## 2022-11-09 DIAGNOSIS — N3281 Overactive bladder: Secondary | ICD-10-CM | POA: Diagnosis not present

## 2022-11-09 DIAGNOSIS — Z7901 Long term (current) use of anticoagulants: Secondary | ICD-10-CM | POA: Diagnosis not present

## 2022-11-09 DIAGNOSIS — T84296D Other mechanical complication of internal fixation device of vertebrae, subsequent encounter: Secondary | ICD-10-CM | POA: Diagnosis not present

## 2022-11-09 DIAGNOSIS — G8929 Other chronic pain: Secondary | ICD-10-CM | POA: Diagnosis not present

## 2022-11-09 DIAGNOSIS — E559 Vitamin D deficiency, unspecified: Secondary | ICD-10-CM | POA: Diagnosis not present

## 2022-11-09 DIAGNOSIS — Z87891 Personal history of nicotine dependence: Secondary | ICD-10-CM | POA: Diagnosis not present

## 2022-11-09 DIAGNOSIS — M5441 Lumbago with sciatica, right side: Secondary | ICD-10-CM | POA: Diagnosis not present

## 2022-11-09 DIAGNOSIS — G5603 Carpal tunnel syndrome, bilateral upper limbs: Secondary | ICD-10-CM | POA: Diagnosis not present

## 2022-11-09 DIAGNOSIS — M4316 Spondylolisthesis, lumbar region: Secondary | ICD-10-CM | POA: Diagnosis not present

## 2022-11-09 DIAGNOSIS — M47816 Spondylosis without myelopathy or radiculopathy, lumbar region: Secondary | ICD-10-CM | POA: Diagnosis not present

## 2022-11-09 DIAGNOSIS — Z79891 Long term (current) use of opiate analgesic: Secondary | ICD-10-CM | POA: Diagnosis not present

## 2022-11-09 DIAGNOSIS — Z85828 Personal history of other malignant neoplasm of skin: Secondary | ICD-10-CM | POA: Diagnosis not present

## 2022-11-09 DIAGNOSIS — Z9484 Stem cells transplant status: Secondary | ICD-10-CM | POA: Diagnosis not present

## 2022-11-09 DIAGNOSIS — M5136 Other intervertebral disc degeneration, lumbar region: Secondary | ICD-10-CM | POA: Diagnosis not present

## 2022-11-09 DIAGNOSIS — M160 Bilateral primary osteoarthritis of hip: Secondary | ICD-10-CM | POA: Diagnosis not present

## 2022-11-09 DIAGNOSIS — E039 Hypothyroidism, unspecified: Secondary | ICD-10-CM | POA: Diagnosis not present

## 2022-11-09 DIAGNOSIS — E785 Hyperlipidemia, unspecified: Secondary | ICD-10-CM | POA: Diagnosis not present

## 2022-11-09 DIAGNOSIS — Z8579 Personal history of other malignant neoplasms of lymphoid, hematopoietic and related tissues: Secondary | ICD-10-CM | POA: Diagnosis not present

## 2022-11-13 DIAGNOSIS — Z9484 Stem cells transplant status: Secondary | ICD-10-CM | POA: Diagnosis not present

## 2022-11-13 DIAGNOSIS — G5603 Carpal tunnel syndrome, bilateral upper limbs: Secondary | ICD-10-CM | POA: Diagnosis not present

## 2022-11-13 DIAGNOSIS — J309 Allergic rhinitis, unspecified: Secondary | ICD-10-CM | POA: Diagnosis not present

## 2022-11-13 DIAGNOSIS — M4316 Spondylolisthesis, lumbar region: Secondary | ICD-10-CM | POA: Diagnosis not present

## 2022-11-13 DIAGNOSIS — Z86711 Personal history of pulmonary embolism: Secondary | ICD-10-CM | POA: Diagnosis not present

## 2022-11-13 DIAGNOSIS — E559 Vitamin D deficiency, unspecified: Secondary | ICD-10-CM | POA: Diagnosis not present

## 2022-11-13 DIAGNOSIS — E785 Hyperlipidemia, unspecified: Secondary | ICD-10-CM | POA: Diagnosis not present

## 2022-11-13 DIAGNOSIS — M5441 Lumbago with sciatica, right side: Secondary | ICD-10-CM | POA: Diagnosis not present

## 2022-11-13 DIAGNOSIS — Z79891 Long term (current) use of opiate analgesic: Secondary | ICD-10-CM | POA: Diagnosis not present

## 2022-11-13 DIAGNOSIS — M48061 Spinal stenosis, lumbar region without neurogenic claudication: Secondary | ICD-10-CM | POA: Diagnosis not present

## 2022-11-13 DIAGNOSIS — T84296D Other mechanical complication of internal fixation device of vertebrae, subsequent encounter: Secondary | ICD-10-CM | POA: Diagnosis not present

## 2022-11-13 DIAGNOSIS — M47816 Spondylosis without myelopathy or radiculopathy, lumbar region: Secondary | ICD-10-CM | POA: Diagnosis not present

## 2022-11-13 DIAGNOSIS — N3281 Overactive bladder: Secondary | ICD-10-CM | POA: Diagnosis not present

## 2022-11-13 DIAGNOSIS — M5136 Other intervertebral disc degeneration, lumbar region: Secondary | ICD-10-CM | POA: Diagnosis not present

## 2022-11-13 DIAGNOSIS — M160 Bilateral primary osteoarthritis of hip: Secondary | ICD-10-CM | POA: Diagnosis not present

## 2022-11-13 DIAGNOSIS — Z85828 Personal history of other malignant neoplasm of skin: Secondary | ICD-10-CM | POA: Diagnosis not present

## 2022-11-13 DIAGNOSIS — Z87891 Personal history of nicotine dependence: Secondary | ICD-10-CM | POA: Diagnosis not present

## 2022-11-13 DIAGNOSIS — G8929 Other chronic pain: Secondary | ICD-10-CM | POA: Diagnosis not present

## 2022-11-13 DIAGNOSIS — Z7901 Long term (current) use of anticoagulants: Secondary | ICD-10-CM | POA: Diagnosis not present

## 2022-11-13 DIAGNOSIS — E039 Hypothyroidism, unspecified: Secondary | ICD-10-CM | POA: Diagnosis not present

## 2022-11-13 DIAGNOSIS — Z8579 Personal history of other malignant neoplasms of lymphoid, hematopoietic and related tissues: Secondary | ICD-10-CM | POA: Diagnosis not present

## 2022-11-16 ENCOUNTER — Telehealth: Payer: Self-pay

## 2022-11-20 NOTE — Telephone Encounter (Signed)
Called patient to make overdue follow up appointment. Patient states he will call back once he figures out when he can come.

## 2022-11-21 ENCOUNTER — Ambulatory Visit (INDEPENDENT_AMBULATORY_CARE_PROVIDER_SITE_OTHER): Payer: Medicare Other | Admitting: Urology

## 2022-11-21 VITALS — BP 89/56 | HR 73

## 2022-11-21 DIAGNOSIS — N3281 Overactive bladder: Secondary | ICD-10-CM

## 2022-11-21 DIAGNOSIS — C61 Malignant neoplasm of prostate: Secondary | ICD-10-CM

## 2022-11-21 DIAGNOSIS — Z7901 Long term (current) use of anticoagulants: Secondary | ICD-10-CM | POA: Diagnosis not present

## 2022-11-21 LAB — URINALYSIS, ROUTINE W REFLEX MICROSCOPIC
Bilirubin, UA: NEGATIVE
Glucose, UA: NEGATIVE
Leukocytes,UA: NEGATIVE
Nitrite, UA: NEGATIVE
RBC, UA: NEGATIVE
Specific Gravity, UA: 1.03 (ref 1.005–1.030)
Urobilinogen, Ur: 1 mg/dL (ref 0.2–1.0)
pH, UA: 6 (ref 5.0–7.5)

## 2022-11-21 LAB — MICROSCOPIC EXAMINATION: Bacteria, UA: NONE SEEN

## 2022-11-21 MED ORDER — MIRABEGRON ER 25 MG PO TB24
25.0000 mg | ORAL_TABLET | Freq: Every day | ORAL | 11 refills | Status: DC
Start: 2022-11-21 — End: 2023-11-22

## 2022-11-21 MED ORDER — TOLTERODINE TARTRATE ER 4 MG PO CP24
4.0000 mg | ORAL_CAPSULE | Freq: Every day | ORAL | 11 refills | Status: DC
Start: 2022-11-21 — End: 2023-11-22

## 2022-11-21 NOTE — Progress Notes (Signed)
11/21/2022 3:18 PM   Rodney [name] [birth year] [id number]  Referring provider: Center, [facility] [address] [place],  [place] [address]  Followup prostate cancer and OAB   HPI: Rodney [name] is a 82yo here for followup for prostate cancer and OAB. No recent PSA. He has had 3 spine surgeries in the past 2 years. His last surgery was in July 2024. He had worsening urinary frequency since his last surgery which has improved the past 2-3 weeks. He is currently on detrol 4mg  daily and mirabegron 25mg  daily.  His stream is weaker and he has to site to urinate.    PMH: Past Medical History:  Diagnosis Date   Anemia 02-16-11   01-05-11- post surgery-Transfusions x 4 units   Arthritis    Bone pain 02/18/2013   Cellulitis    Clotting disorder (HCC)    Complication of anesthesia 02-16-11   Pt. speaks of awakening during the surgery from back  surgery   Degenerative disc disease 02-16-11   01-05-11 L3 fusion done   Esophageal reflux 05/31/2008   Qualifier: Diagnosis of  By: Lovell Sheehan MD, Balinda Quails    Hernia 02-16-11   left inguinal hernia at present   Hypothyroidism 02/01/2014   Multiple myeloma 02-16-11   suspected tumor  left sacral   Pancreatitis    Personal history of traumatic fracture 07/10/2012   Prostate cancer [facility])    Pulmonary embolism (HCC)    Second degree atrioventricular block 11/26/2012   Shortness of breath 02-16-11   hx. Pulmonary emboli x2 (9 yrs/ 3'12 -last)    Surgical History: Past Surgical History:  Procedure Laterality Date   APPENDECTOMY     CATARACT EXTRACTION     CHOLECYSTECTOMY  04/06/2010   laparoscopic-inflammation with stones   INGUINAL HERNIA REPAIR  02/20/2011   Procedure: HERNIA REPAIR INGUINAL ADULT;  Surgeon: Velora Heckler, MD;  Location: [facility] ORS;  Service: General;  Laterality: Left;  Repair Left Inguinal Hernia with Mesh   LUMBAR DISC SURGERY  02-16-11    01-05-11 Lumbar surgery L3(complicated by loss of blood volume)/ 01-09-11 then Lumbar  fusion done with retained  hardware    LUMBAR LAMINECTOMY/DECOMPRESSION MICRODISCECTOMY Right 01/16/2022   Procedure: Right Lumbar Five- Sacral One Laminectomy for synovial/facet cyst;  Surgeon: Coletta Memos, MD;  Location: [facility] OR;  Service: Neurosurgery;  Laterality: Right;  3C   PROSTATE SURGERY Bilateral 2011   TONSILLECTOMY AND ADENOIDECTOMY      Home Medications:  Allergies as of 11/21/2022       Reactions   Blood-group Specific Substance    Must be Leukocyte Reduced and Irradiated due to stem cell transplant.    Prednisone Other (See Comments)   Agitation  Anger        Medication List        Accurate as of November 21, 2022  3:18 PM. If you have any questions, ask your nurse or doctor.          acetaminophen 500 MG tablet Commonly known as: TYLENOL Take 500-1,000 mg by mouth every 6 (six) hours as needed for headache (pain).   B-12 3000 MCG Caps Take 3,000 mcg by mouth daily.   Biotin 5 MG Caps Take 5 mg by mouth at bedtime.   CALTRATE 600+D PO Take 1 tablet by mouth 2 (two) times daily.   cetirizine 10 MG tablet Commonly known as: ZYRTEC Take 10 mg by mouth at bedtime as needed for allergies. At night   citalopram 10 MG  tablet Commonly known as: CELEXA Take 1 tablet (10 mg total) by mouth daily. What changed: when to take this   ESTER-C PO Take 1,000 mg by mouth daily.   gabapentin 300 MG capsule Commonly known as: NEURONTIN TAKE 3 CAPSULES BY MOUTH AT BEDTIME**PATIENT NEEDS APT FOR FURTHER REFILLS OF THIS MEDICATION** What changed:  how much to take how to take this when to take this additional instructions   HYDROcodone-acetaminophen 5-325 MG tablet Commonly known as: NORCO/VICODIN Take 1-2 tablets by mouth every 4 (four) hours as needed for moderate pain.   levothyroxine 112 MCG tablet Commonly known as: SYNTHROID Take 112 mcg by mouth daily before breakfast.   lidocaine 5 % ointment Commonly known as: XYLOCAINE Apply 1 Application  topically 3 (three) times daily as needed.   multivitamin with minerals Tabs tablet Take 1 tablet by mouth daily.   Myrbetriq 25 MG Tb24 tablet Generic drug: mirabegron ER TAKE 1 TABLET BY MOUTH EVERY DAY   NASACORT ALLERGY 24HR NA Place 2 sprays into the nose at bedtime as needed (congestion).   triamcinolone 55 MCG/ACT Aero nasal inhaler Commonly known as: Nasacort Allergy 24HR Place 2 sprays into the nose daily as needed (allergies).   nitroGLYCERIN 0.2 mg/hr patch Commonly known as: NITRODUR - Dosed in mg/24 hr PLACE 1/4 TO 1/2 OF PATCH OVER AFFECTED REGION. REMOVE AND REPLACE ONCE DAILY. SLIGHTLY ALTER SKIN PLACEMENT DAILY What changed:  how much to take how to take this when to take this additional instructions   omeprazole 20 MG capsule Commonly known as: PRILOSEC TAKE 1 CAPSULE(20 MG) BY MOUTH TWICE DAILY   ondansetron 4 MG tablet Commonly known as: ZOFRAN Take 4 mg by mouth every 8 (eight) hours as needed for nausea or vomiting.   oxycodone 5 MG capsule Commonly known as: OXY-IR Take 5 mg by mouth every 6 (six) hours as needed.   potassium chloride SA 20 MEQ tablet Commonly known as: KLOR-CON M TAKE 1 TABLET BY MOUTH EVERY DAY What changed: when to take this   ROBITUSSIN MAXIMUM STRENGTH PO Take 2.5 mLs by mouth daily as needed.   tiZANidine 4 MG tablet Commonly known as: ZANAFLEX Take 1 tablet (4 mg total) by mouth every 6 (six) hours as needed for muscle spasms.   tiZANidine 4 MG tablet Commonly known as: ZANAFLEX Take 1 tablet (4 mg total) by mouth every 6 (six) hours as needed for muscle spasms.   tolterodine 4 MG 24 hr capsule Commonly known as: DETROL LA TAKE 1 CAPSULE BY MOUTH EVERY DAY What changed:  how much to take when to take this   Tums Ultra 1000 1000 MG chewable tablet Generic drug: calcium elemental as carbonate Chew 1,000-2,000 mg by mouth daily as needed for heartburn.   Vitamin D 1000 units capsule Take 1,000 Units by mouth  2 (two) times daily.   warfarin 2.5 MG tablet Commonly known as: COUMADIN Take 2.5-5 mg by mouth at bedtime. Take 5 mg on Monday, Wednesday & Friday and 2.5 mg all other days.        Allergies:  Allergies  Allergen Reactions   Blood-Group Specific Substance     Must be Leukocyte Reduced and Irradiated due to stem cell transplant.    Prednisone Other (See Comments)    Agitation  Anger    Family History: Family History  Problem Relation Age of Onset   Lymphoma Father 3   Prostate cancer Father    Clotting disorder Mother  blood clots   Deep vein thrombosis Mother    Lung disease Brother    Diabetes Son    Diabetes Daughter    Diabetes Daughter    Colon cancer Neg Hx    Esophageal cancer Neg Hx    Rectal cancer Neg Hx    Stomach cancer Neg Hx     Social History:  reports that he quit smoking about 53 years ago. His smoking use included cigarettes. He has never used smokeless tobacco. He reports that he does not drink alcohol and does not use drugs.  ROS: All other review of systems were reviewed and are negative except what is noted above in HPI  Physical Exam: BP (!) 89/56   Pulse 73   Constitutional:  Alert and oriented, No acute distress. HEENT: [place] AT, moist mucus membranes.  Trachea midline, no masses. Cardiovascular: No clubbing, cyanosis, or edema. Respiratory: Normal respiratory effort, no increased work of breathing. GI: Abdomen is soft, nontender, nondistended, no abdominal masses GU: No CVA tenderness.  Lymph: No cervical or inguinal lymphadenopathy. Skin: No rashes, bruises or suspicious lesions. Neurologic: Grossly intact, no focal deficits, moving all 4 extremities. Psychiatric: Normal mood and affect.  Laboratory Data: Lab Results  Component Value Date   WBC 7.2 09/28/2022   HGB 12.2 (L) 09/28/2022   HCT 36.6 (L) 09/28/2022   MCV 92.2 09/28/2022   PLT 192 09/28/2022    Lab Results  Component Value Date   CREATININE 1.03 09/24/2022     Lab Results  Component Value Date   PSA 0.00 (L) 09/25/2018   PSA 0.00 Repeated and verified X2. (L) 09/24/2016   PSA 0.00 Repeated and verified X2. (L) 02/17/2016    No results found for: "TESTOSTERONE"  No results found for: "HGBA1C"  Urinalysis    Component Value Date/Time   COLORURINE YELLOW 08/07/2022 2050   APPEARANCEUR CLEAR 08/07/2022 2050   APPEARANCEUR Clear 11/06/2019 1336   LABSPEC 1.024 08/07/2022 2050   PHURINE 6.0 08/07/2022 2050   GLUCOSEU NEGATIVE 08/07/2022 2050   GLUCOSEU NEGATIVE 09/24/2016 1212   HGBUR NEGATIVE 08/07/2022 2050   BILIRUBINUR NEGATIVE 08/07/2022 2050   BILIRUBINUR Negative 11/06/2019 1336   KETONESUR NEGATIVE 08/07/2022 2050   PROTEINUR 30 (A) 08/07/2022 2050   UROBILINOGEN 0.2 09/24/2016 1212   NITRITE NEGATIVE 08/07/2022 2050   LEUKOCYTESUR NEGATIVE 08/07/2022 2050    Lab Results  Component Value Date   LABMICR See below: 11/06/2019   WBCUA None seen 11/06/2019   LABEPIT None seen 11/06/2019   MUCUS Presence of (A) 09/24/2016   BACTERIA NONE SEEN 08/07/2022    Pertinent Imaging:  No results found for this or any previous visit.  No results found for this or any previous visit.  No results found for this or any previous visit.  No results found for this or any previous visit.  No results found for this or any previous visit.  No valid procedures specified. No results found for this or any previous visit.  No results found for this or any previous visit.   Assessment & Plan:    1. Prostate cancer (HCC) -followup 1 year with PSA - Urinalysis, Routine w reflex microscopic - PSA - PSA; Future  2. OAB (overactive bladder) Continue detral LL 4mg  and mirabegron 25mg    No follow-ups on file.  Wilkie Aye, MD  [facility] [place]

## 2022-11-22 LAB — PSA: Prostate Specific Ag, Serum: <0.1 ng/mL (ref 0.0–4.0)

## 2022-11-27 ENCOUNTER — Encounter: Payer: Self-pay | Admitting: Urology

## 2022-11-27 NOTE — Progress Notes (Signed)
Letter sent.

## 2022-11-27 NOTE — Patient Instructions (Signed)

## 2022-12-05 DIAGNOSIS — M25511 Pain in right shoulder: Secondary | ICD-10-CM | POA: Diagnosis not present

## 2022-12-05 DIAGNOSIS — M25512 Pain in left shoulder: Secondary | ICD-10-CM | POA: Diagnosis not present

## 2022-12-19 DIAGNOSIS — Z7901 Long term (current) use of anticoagulants: Secondary | ICD-10-CM | POA: Diagnosis not present

## 2023-01-16 DIAGNOSIS — E039 Hypothyroidism, unspecified: Secondary | ICD-10-CM | POA: Diagnosis not present

## 2023-01-16 DIAGNOSIS — Z9481 Bone marrow transplant status: Secondary | ICD-10-CM | POA: Diagnosis not present

## 2023-01-16 DIAGNOSIS — Z7901 Long term (current) use of anticoagulants: Secondary | ICD-10-CM | POA: Diagnosis not present

## 2023-01-17 DIAGNOSIS — M4316 Spondylolisthesis, lumbar region: Secondary | ICD-10-CM | POA: Diagnosis not present

## 2023-01-17 DIAGNOSIS — M25551 Pain in right hip: Secondary | ICD-10-CM | POA: Diagnosis not present

## 2023-01-24 DIAGNOSIS — G5603 Carpal tunnel syndrome, bilateral upper limbs: Secondary | ICD-10-CM | POA: Diagnosis not present

## 2023-01-24 DIAGNOSIS — C9000 Multiple myeloma not having achieved remission: Secondary | ICD-10-CM | POA: Diagnosis not present

## 2023-01-24 DIAGNOSIS — R6889 Other general symptoms and signs: Secondary | ICD-10-CM | POA: Diagnosis not present

## 2023-01-24 DIAGNOSIS — C9001 Multiple myeloma in remission: Secondary | ICD-10-CM | POA: Diagnosis not present

## 2023-02-18 DIAGNOSIS — Z7901 Long term (current) use of anticoagulants: Secondary | ICD-10-CM | POA: Diagnosis not present

## 2023-03-13 DIAGNOSIS — Z7901 Long term (current) use of anticoagulants: Secondary | ICD-10-CM | POA: Diagnosis not present

## 2023-03-13 DIAGNOSIS — C9001 Multiple myeloma in remission: Secondary | ICD-10-CM | POA: Diagnosis not present

## 2023-03-13 DIAGNOSIS — E538 Deficiency of other specified B group vitamins: Secondary | ICD-10-CM | POA: Diagnosis not present

## 2023-03-13 DIAGNOSIS — E039 Hypothyroidism, unspecified: Secondary | ICD-10-CM | POA: Diagnosis not present

## 2023-03-14 DIAGNOSIS — M4316 Spondylolisthesis, lumbar region: Secondary | ICD-10-CM | POA: Diagnosis not present

## 2023-03-14 DIAGNOSIS — T8484XA Pain due to internal orthopedic prosthetic devices, implants and grafts, initial encounter: Secondary | ICD-10-CM | POA: Diagnosis not present

## 2023-03-21 DIAGNOSIS — L820 Inflamed seborrheic keratosis: Secondary | ICD-10-CM | POA: Diagnosis not present

## 2023-03-21 DIAGNOSIS — L821 Other seborrheic keratosis: Secondary | ICD-10-CM | POA: Diagnosis not present

## 2023-03-21 DIAGNOSIS — L2989 Other pruritus: Secondary | ICD-10-CM | POA: Diagnosis not present

## 2023-03-21 DIAGNOSIS — D225 Melanocytic nevi of trunk: Secondary | ICD-10-CM | POA: Diagnosis not present

## 2023-03-21 DIAGNOSIS — L814 Other melanin hyperpigmentation: Secondary | ICD-10-CM | POA: Diagnosis not present

## 2023-03-21 DIAGNOSIS — L538 Other specified erythematous conditions: Secondary | ICD-10-CM | POA: Diagnosis not present

## 2023-03-21 DIAGNOSIS — L570 Actinic keratosis: Secondary | ICD-10-CM | POA: Diagnosis not present

## 2023-03-21 DIAGNOSIS — Z08 Encounter for follow-up examination after completed treatment for malignant neoplasm: Secondary | ICD-10-CM | POA: Diagnosis not present

## 2023-03-21 DIAGNOSIS — Z85828 Personal history of other malignant neoplasm of skin: Secondary | ICD-10-CM | POA: Diagnosis not present

## 2023-03-21 DIAGNOSIS — Z789 Other specified health status: Secondary | ICD-10-CM | POA: Diagnosis not present

## 2023-03-25 DIAGNOSIS — M25511 Pain in right shoulder: Secondary | ICD-10-CM | POA: Diagnosis not present

## 2023-04-10 DIAGNOSIS — D689 Coagulation defect, unspecified: Secondary | ICD-10-CM | POA: Diagnosis not present

## 2023-04-10 DIAGNOSIS — Z7901 Long term (current) use of anticoagulants: Secondary | ICD-10-CM | POA: Diagnosis not present

## 2023-04-29 ENCOUNTER — Telehealth: Payer: Self-pay | Admitting: Urology

## 2023-04-29 NOTE — Telephone Encounter (Signed)
 Patient says tolterodine makes him dizzy

## 2023-04-29 NOTE — Telephone Encounter (Signed)
 Tried calling patient with no answer, unable to leave vm. Tolterodine was update in patient allergies as requested.

## 2023-05-02 DIAGNOSIS — G5603 Carpal tunnel syndrome, bilateral upper limbs: Secondary | ICD-10-CM | POA: Diagnosis not present

## 2023-05-02 DIAGNOSIS — C9001 Multiple myeloma in remission: Secondary | ICD-10-CM | POA: Diagnosis not present

## 2023-05-02 DIAGNOSIS — R6889 Other general symptoms and signs: Secondary | ICD-10-CM | POA: Diagnosis not present

## 2023-05-02 DIAGNOSIS — C9000 Multiple myeloma not having achieved remission: Secondary | ICD-10-CM | POA: Diagnosis not present

## 2023-05-09 DIAGNOSIS — Z7901 Long term (current) use of anticoagulants: Secondary | ICD-10-CM | POA: Diagnosis not present

## 2023-05-13 DIAGNOSIS — T8484XA Pain due to internal orthopedic prosthetic devices, implants and grafts, initial encounter: Secondary | ICD-10-CM | POA: Diagnosis not present

## 2023-05-13 DIAGNOSIS — M4316 Spondylolisthesis, lumbar region: Secondary | ICD-10-CM | POA: Diagnosis not present

## 2023-05-14 ENCOUNTER — Ambulatory Visit (HOSPITAL_BASED_OUTPATIENT_CLINIC_OR_DEPARTMENT_OTHER)
Admission: RE | Admit: 2023-05-14 | Discharge: 2023-05-14 | Disposition: A | Discharge status: Home / Self Care | Source: Ambulatory Visit | Attending: Neurosurgery | Admitting: Neurosurgery

## 2023-05-14 ENCOUNTER — Other Ambulatory Visit (HOSPITAL_BASED_OUTPATIENT_CLINIC_OR_DEPARTMENT_OTHER): Payer: Self-pay | Admitting: Neurosurgery

## 2023-05-14 DIAGNOSIS — M898X9 Other specified disorders of bone, unspecified site: Secondary | ICD-10-CM | POA: Diagnosis not present

## 2023-05-14 DIAGNOSIS — M4316 Spondylolisthesis, lumbar region: Secondary | ICD-10-CM

## 2023-05-14 DIAGNOSIS — M51379 Other intervertebral disc degeneration, lumbosacral region without mention of lumbar back pain or lower extremity pain: Secondary | ICD-10-CM | POA: Diagnosis not present

## 2023-05-14 DIAGNOSIS — M4807 Spinal stenosis, lumbosacral region: Secondary | ICD-10-CM | POA: Diagnosis not present

## 2023-06-06 DIAGNOSIS — Z7901 Long term (current) use of anticoagulants: Secondary | ICD-10-CM | POA: Diagnosis not present

## 2023-06-06 DIAGNOSIS — D689 Coagulation defect, unspecified: Secondary | ICD-10-CM | POA: Diagnosis not present

## 2023-06-13 DIAGNOSIS — M25551 Pain in right hip: Secondary | ICD-10-CM | POA: Diagnosis not present

## 2023-06-19 DIAGNOSIS — Z8719 Personal history of other diseases of the digestive system: Secondary | ICD-10-CM | POA: Diagnosis not present

## 2023-06-19 DIAGNOSIS — K219 Gastro-esophageal reflux disease without esophagitis: Secondary | ICD-10-CM | POA: Diagnosis not present

## 2023-06-19 DIAGNOSIS — R1013 Epigastric pain: Secondary | ICD-10-CM | POA: Diagnosis not present

## 2023-06-25 DIAGNOSIS — M25552 Pain in left hip: Secondary | ICD-10-CM | POA: Diagnosis not present

## 2023-07-09 DIAGNOSIS — Z7901 Long term (current) use of anticoagulants: Secondary | ICD-10-CM | POA: Diagnosis not present

## 2023-07-18 DIAGNOSIS — J069 Acute upper respiratory infection, unspecified: Secondary | ICD-10-CM | POA: Diagnosis not present

## 2023-08-01 DIAGNOSIS — C9001 Multiple myeloma in remission: Secondary | ICD-10-CM | POA: Diagnosis not present

## 2023-08-01 DIAGNOSIS — C9000 Multiple myeloma not having achieved remission: Secondary | ICD-10-CM | POA: Diagnosis not present

## 2023-08-01 DIAGNOSIS — R6889 Other general symptoms and signs: Secondary | ICD-10-CM | POA: Diagnosis not present

## 2023-08-02 DIAGNOSIS — Z7901 Long term (current) use of anticoagulants: Secondary | ICD-10-CM | POA: Diagnosis not present

## 2023-08-15 DIAGNOSIS — M25551 Pain in right hip: Secondary | ICD-10-CM | POA: Diagnosis not present

## 2023-08-15 DIAGNOSIS — M7061 Trochanteric bursitis, right hip: Secondary | ICD-10-CM | POA: Diagnosis not present

## 2023-08-19 DIAGNOSIS — M7061 Trochanteric bursitis, right hip: Secondary | ICD-10-CM | POA: Diagnosis not present

## 2023-08-30 DIAGNOSIS — M7061 Trochanteric bursitis, right hip: Secondary | ICD-10-CM | POA: Diagnosis not present

## 2023-08-30 DIAGNOSIS — D689 Coagulation defect, unspecified: Secondary | ICD-10-CM | POA: Diagnosis not present

## 2023-08-30 DIAGNOSIS — Z7901 Long term (current) use of anticoagulants: Secondary | ICD-10-CM | POA: Diagnosis not present

## 2023-09-09 DIAGNOSIS — R059 Cough, unspecified: Secondary | ICD-10-CM | POA: Diagnosis not present

## 2023-09-09 DIAGNOSIS — R0602 Shortness of breath: Secondary | ICD-10-CM | POA: Diagnosis not present

## 2023-09-19 DIAGNOSIS — Z03818 Encounter for observation for suspected exposure to other biological agents ruled out: Secondary | ICD-10-CM | POA: Diagnosis not present

## 2023-09-19 DIAGNOSIS — R051 Acute cough: Secondary | ICD-10-CM | POA: Diagnosis not present

## 2023-10-02 ENCOUNTER — Emergency Department (HOSPITAL_COMMUNITY)
Admission: EM | Admit: 2023-10-02 | Discharge: 2023-10-02 | Disposition: A | Discharge status: Home / Self Care | Attending: Emergency Medicine | Admitting: Emergency Medicine

## 2023-10-02 ENCOUNTER — Other Ambulatory Visit: Payer: Self-pay

## 2023-10-02 DIAGNOSIS — Z8546 Personal history of malignant neoplasm of prostate: Secondary | ICD-10-CM | POA: Diagnosis not present

## 2023-10-02 DIAGNOSIS — K5900 Constipation, unspecified: Secondary | ICD-10-CM | POA: Diagnosis not present

## 2023-10-02 DIAGNOSIS — E039 Hypothyroidism, unspecified: Secondary | ICD-10-CM | POA: Diagnosis not present

## 2023-10-02 DIAGNOSIS — Z8579 Personal history of other malignant neoplasms of lymphoid, hematopoietic and related tissues: Secondary | ICD-10-CM | POA: Insufficient documentation

## 2023-10-02 MED ORDER — POLYETHYLENE GLYCOL 3350 17 G PO PACK: PACK | ORAL | 0 refills | Status: AC

## 2023-10-02 MED ORDER — SMOG ENEMA
960.0000 mL | Freq: Once | RECTAL | Status: AC
  Administered 2023-10-02: 960 mL via RECTAL
  Filled 2023-10-02: qty 960

## 2023-10-02 MED ORDER — BISACODYL 10 MG RE SUPP: 10.0000 mg | RECTAL | 0 refills | Status: AC | PRN

## 2023-10-02 MED ORDER — ACETAMINOPHEN 325 MG PO TABS
975.0000 mg | ORAL_TABLET | Freq: Once | ORAL | Status: AC
  Administered 2023-10-02: 975 mg via ORAL
  Filled 2023-10-02: qty 3

## 2023-10-02 NOTE — ED Triage Notes (Signed)
 Patient to ED by POV with c/o constipation. Per patient his last BM was 4 days ago , HX of impaction tried OTC meds no relief. Voices cramping in BL ABD. ABD non distended denies being able to pass gas.

## 2023-10-02 NOTE — Discharge Instructions (Addendum)
 Consider following up with your primary care doctor or a GI doctor for further evaluation of your constipation.  Take the medications as prescribed for continued treatment of your constipation.  I do recommend that you continue with MiraLAX  daily to help prevent recurrent bouts of constipation.  Return to the ER for fever vomiting or other concerning symptoms

## 2023-10-02 NOTE — ED Provider Notes (Signed)
 Rodney [name] EMERGENCY DEPARTMENT AT [facility] Provider Note   CSN: [id number] Arrival date & time: 10/02/23  9240     Patient presents with: Constipation   Rodney [name] is a 83 y.o. male.    Constipation    Patient has a history of pulmonary embolism pancreatitis prostate cancer multiple myeloma hypothyroidism arthritis cellulitis.  Patient presents ED with complaints of constipation and impaction.  Patient states he has not had a a bowel movement since 4 days ago.  Patient states he tried an enema this morning without relief.  He is having pain in his lower abdomen as well as to his rectal area.  Patient denies any fevers.  He is not having any vomiting.  Prior to Admission medications   Medication Sig Start Date End Date Taking? Authorizing Provider  bisacodyl  (DULCOLAX) 10 MG suppository Place 1 suppository (10 mg total) rectally as needed for moderate constipation. 10/02/23  Yes Randol Simmonds, MD  polyethylene glycol (MIRALAX ) 17 g packet Take 17 g by mouth 2 (two) times daily for 5 days, THEN 17 g daily. 10/02/23 12/06/23 Yes Randol Simmonds, MD  acetaminophen  (TYLENOL ) 500 MG tablet Take 500-1,000 mg by mouth every 6 (six) hours as needed for headache (pain).    [provider]  Bioflavonoid Products (ESTER-C PO) Take 1,000 mg by mouth daily.    [provider]  Biotin  5 MG CAPS Take 5 mg by mouth at bedtime.    [provider]  Calcium  Carbonate-Vitamin D  (CALTRATE 600+D PO) Take 1 tablet by mouth 2 (two) times daily.    [provider]  calcium  elemental as carbonate (TUMS ULTRA 1000) 400 MG chewable tablet Chew 1,000-2,000 mg by mouth daily as needed for heartburn.    [provider]  cetirizine (ZYRTEC) 10 MG tablet Take 10 mg by mouth at bedtime as needed for allergies. At night    [provider]  Cholecalciferol  (VITAMIN D ) 1000 UNITS capsule Take 1,000 Units by mouth 2 (two) times daily.    [provider]  citalopram  (CELEXA ) 10 MG tablet Take 1 tablet (10 mg total) by mouth daily. Patient taking differently: Take 10 mg by mouth at bedtime. 06/25/22   Towana Small, FNP  Cyanocobalamin  (B-12) 3000 MCG CAPS Take 3,000 mcg by mouth daily.    [provider]  Dextromethorphan HBr (ROBITUSSIN MAXIMUM STRENGTH PO) Take 2.5 mLs by mouth daily as needed.    [provider]  gabapentin  (NEURONTIN ) 300 MG capsule TAKE 3 CAPSULES BY MOUTH AT BEDTIME**PATIENT NEEDS APT FOR FURTHER REFILLS OF THIS MEDICATION** Patient taking differently: Take 600 mg by mouth at bedtime. 05/18/21   [place], Quintin PARAS, MD  HYDROcodone -acetaminophen  (NORCO/VICODIN) 5-325 MG tablet Take 1-2 tablets by mouth every 4 (four) hours as needed for moderate pain. 09/30/22   Gillie Duncans, MD  levothyroxine  (SYNTHROID ) 112 MCG tablet Take 112 mcg by mouth daily before breakfast.    [provider]  lidocaine  (XYLOCAINE ) 5 % ointment Apply 1 Application topically 3 (three) times daily as needed. Patient not taking: Reported on 08/03/2022 07/13/22   Neldon Hamp RAMAN, PA  mirabegron  ER (MYRBETRIQ ) 25 MG TB24 tablet Take 1 tablet (25 mg total) by mouth daily. 11/21/22   [name], Belvie CROME, MD  Multiple Vitamin (MULTIVITAMIN WITH MINERALS) TABS tablet Take 1 tablet by mouth daily.    [provider]  nitroGLYCERIN  (NITRODUR - DOSED IN MG/24 HR) 0.2 mg/hr patch PLACE 1/4 TO 1/2 OF PATCH OVER AFFECTED  REGION. REMOVE AND REPLACE ONCE DAILY. SLIGHTLY ALTER SKIN PLACEMENT DAILY Patient taking differently: Place 0.05-0.2 mg onto the skin See admin instructions. Place 0.05 mg (1/4 patch) to 0.2 mg (full patch) as needed for pain 01/30/21   [place], Quintin PARAS, MD  omeprazole  (PRILOSEC) 20 MG capsule TAKE 1 CAPSULE(20 MG) BY MOUTH TWICE DAILY 10/10/22   [place], Quintin PARAS, MD  ondansetron  (ZOFRAN ) 4 MG tablet Take 4 mg by mouth every 8 (eight) hours as needed for nausea or vomiting.    [provider]   oxycodone  (OXY-IR) 5 MG capsule Take 5 mg by mouth every 6 (six) hours as needed.    [provider]  potassium chloride  SA (KLOR-CON  M) 20 MEQ tablet TAKE 1 TABLET BY MOUTH EVERY DAY Patient taking differently: Take 20 mEq by mouth at bedtime. 11/21/21   [place], Quintin PARAS, MD  tiZANidine  (ZANAFLEX ) 4 MG tablet Take 1 tablet (4 mg total) by mouth every 6 (six) hours as needed for muscle spasms. 08/14/22   Gillie Duncans, MD  tiZANidine  (ZANAFLEX ) 4 MG tablet Take 1 tablet (4 mg total) by mouth every 6 (six) hours as needed for muscle spasms. 09/30/22   Gillie Duncans, MD  tolterodine  (DETROL  LA) 4 MG 24 hr capsule Take 1 capsule (4 mg total) by mouth at bedtime. 11/21/22   [name], Belvie CROME, MD  triamcinolone  (NASACORT  ALLERGY 24HR) 55 MCG/ACT AERO nasal inhaler Place 2 sprays into the nose daily as needed (allergies). 09/30/22   Gillie Duncans, MD  Triamcinolone  Acetonide (NASACORT  ALLERGY 24HR NA) Place 2 sprays into the nose at bedtime as needed (congestion).    [provider]  warfarin (COUMADIN ) 2.5 MG tablet Take 2.5-5 mg by mouth at bedtime. Take 5 mg on Monday, Wednesday & Friday and 2.5 mg all other days.    [provider]    Allergies: Blood-group specific substance, Tolterodine , and Prednisone    Review of Systems  Gastrointestinal:  Positive for constipation.    Updated Vital Signs BP 114/72 (BP Location: Left Arm)   Pulse 78   Temp 97.9 F (36.6 C) (Oral)   Resp 18   Ht 1.803 m (5' 11)   Wt 84.8 kg   SpO2 94%   BMI 26.08 kg/m   Physical Exam Vitals and nursing note reviewed.  Constitutional:      Appearance: He is well-developed. He is not diaphoretic.  HENT:     Head: Normocephalic and atraumatic.     Right Ear: External ear normal.     Left Ear: External ear normal.  Eyes:     General: No scleral icterus.       Right eye: No discharge.        Left eye: No discharge.     Conjunctiva/sclera: Conjunctivae normal.  Neck:     Trachea: No  tracheal deviation.  Cardiovascular:     Rate and Rhythm: Normal rate and regular rhythm.  Pulmonary:     Effort: Pulmonary effort is normal. No respiratory distress.     Breath sounds: Normal breath sounds. No stridor. No wheezing or rales.  Abdominal:     General: Bowel sounds are normal. There is no distension.     Palpations: Abdomen is soft.     Tenderness: There is abdominal tenderness. There is no guarding or rebound.     Comments: Ttp lower abd, no rebound or guarding  Genitourinary:    Comments: Proximal fecal impaction, firm hard stool, discomfort during exam Musculoskeletal:  General: No tenderness or deformity.     Cervical back: Neck supple.  Skin:    General: Skin is warm and dry.     Findings: No rash.  Neurological:     General: No focal deficit present.     Mental Status: He is alert.     Cranial Nerves: No cranial nerve deficit, dysarthria or facial asymmetry.     Sensory: No sensory deficit.     Motor: No abnormal muscle tone or seizure activity.     Coordination: Coordination normal.  Psychiatric:        Mood and Affect: Mood normal.     (all labs ordered are listed, but only abnormal results are displayed) Labs Reviewed  CBC  COMPREHENSIVE METABOLIC PANEL WITH GFR    EKG: None  Radiology: No results found.   Procedures   Medications Ordered in the ED  acetaminophen  (TYLENOL ) tablet 975 mg (975 mg Oral Given 10/02/23 0957)  sorbitol , magnesium hydroxide, mineral oil, glycerin (SMOG) enema (960 mLs Rectal Given 10/02/23 1016)                                    Medical Decision Making Problems Addressed: Constipation, unspecified constipation type: acute illness or injury that poses a threat to life or bodily functions  Amount and/or Complexity of Data Reviewed Labs: ordered.  Risk OTC drugs.   Patient presented to the ED for evaluation of constipation.  Patient has not had any vomiting or diarrhea.  On exam he was noted to have  fecal impaction.  I was unable to disimpact significantly because of the proximal location of the stool.  Considered possibility of diverticulitis or colitis however patient had notable firm stool on rectal exam which was causing his pain and discomfort.  Patient was treated with an enema in the ED.  He had a bowel movement and had significant improvement in symptoms.  Initial we ordered laboratory testing however there was issues with the sample and there was never resulted.  At this point I do not feel that laboratory testing is necessary  Discussed follow-up with PCP or possibly GI regarding his constipation.  Will have him take stool softeners and laxatives at home for the next few days.  Discussed taking maintenance MiraLAX  to help prevent further episodes.     Final diagnoses:  Constipation, unspecified constipation type    ED Discharge Orders          Ordered    bisacodyl  (DULCOLAX) 10 MG suppository  As needed        10/02/23 1205    polyethylene glycol (MIRALAX ) 17 g packet  Multiple Frequencies        10/02/23 1205               Randol Simmonds, MD 10/02/23 1208

## 2023-10-10 DIAGNOSIS — T8484XA Pain due to internal orthopedic prosthetic devices, implants and grafts, initial encounter: Secondary | ICD-10-CM | POA: Diagnosis not present

## 2023-10-10 DIAGNOSIS — M4316 Spondylolisthesis, lumbar region: Secondary | ICD-10-CM | POA: Diagnosis not present

## 2023-10-14 ENCOUNTER — Other Ambulatory Visit (HOSPITAL_COMMUNITY): Payer: Self-pay | Admitting: Neurosurgery

## 2023-10-14 DIAGNOSIS — T8484XA Pain due to internal orthopedic prosthetic devices, implants and grafts, initial encounter: Secondary | ICD-10-CM

## 2023-10-18 ENCOUNTER — Other Ambulatory Visit (HOSPITAL_COMMUNITY)

## 2023-10-22 ENCOUNTER — Telehealth: Payer: Self-pay

## 2023-10-22 NOTE — Telephone Encounter (Signed)
 Pt wife states they believe pt Rx of tolterodine  is lowering pt BP wants to know what they should do/ if Rx needs to be changed pt wife advise a message would be sent to MD for his advisement

## 2023-10-24 NOTE — Telephone Encounter (Signed)
 Patient called back to check on medication(s) causing low blood pressure.  Call:  [phone]

## 2023-10-25 ENCOUNTER — Ambulatory Visit (HOSPITAL_COMMUNITY)
Admission: RE | Admit: 2023-10-25 | Discharge: 2023-10-25 | Disposition: A | Discharge status: Home / Self Care | Source: Ambulatory Visit | Attending: Neurosurgery | Admitting: Neurosurgery

## 2023-10-25 ENCOUNTER — Other Ambulatory Visit: Payer: Self-pay

## 2023-10-25 DIAGNOSIS — T8484XA Pain due to internal orthopedic prosthetic devices, implants and grafts, initial encounter: Secondary | ICD-10-CM | POA: Diagnosis not present

## 2023-10-25 DIAGNOSIS — I700 Atherosclerosis of aorta: Secondary | ICD-10-CM | POA: Diagnosis not present

## 2023-10-25 DIAGNOSIS — M4807 Spinal stenosis, lumbosacral region: Secondary | ICD-10-CM | POA: Insufficient documentation

## 2023-10-25 DIAGNOSIS — M48061 Spinal stenosis, lumbar region without neurogenic claudication: Secondary | ICD-10-CM | POA: Diagnosis not present

## 2023-10-25 DIAGNOSIS — M47816 Spondylosis without myelopathy or radiculopathy, lumbar region: Secondary | ICD-10-CM | POA: Diagnosis not present

## 2023-10-25 DIAGNOSIS — C9000 Multiple myeloma not having achieved remission: Secondary | ICD-10-CM | POA: Diagnosis not present

## 2023-10-25 DIAGNOSIS — Z8579 Personal history of other malignant neoplasms of lymphoid, hematopoietic and related tissues: Secondary | ICD-10-CM | POA: Insufficient documentation

## 2023-10-25 DIAGNOSIS — Z981 Arthrodesis status: Secondary | ICD-10-CM | POA: Insufficient documentation

## 2023-10-25 DIAGNOSIS — M4316 Spondylolisthesis, lumbar region: Secondary | ICD-10-CM | POA: Diagnosis not present

## 2023-10-25 MED ORDER — ONDANSETRON HCL 4 MG/2ML IJ SOLN: 4.0000 mg | Freq: Four times a day (QID) | INTRAMUSCULAR | Status: DC | PRN

## 2023-10-25 MED ORDER — LIDOCAINE HCL (PF) 1 % IJ SOLN
5.0000 mL | Freq: Once | INTRAMUSCULAR | Status: AC
  Administered 2023-10-25: 5 mL via INTRADERMAL

## 2023-10-25 MED ORDER — IOHEXOL 180 MG/ML  SOLN
20.0000 mL | Freq: Once | INTRAMUSCULAR | Status: AC | PRN
  Administered 2023-10-25: 20 mL via INTRATHECAL

## 2023-10-25 MED ORDER — OXYCODONE-ACETAMINOPHEN 5-325 MG PO TABS: 1.0000 | ORAL_TABLET | ORAL | Status: DC | PRN

## 2023-10-25 NOTE — Discharge Instructions (Signed)
Myelogram and Lumbar Puncture Discharge Instructions  Go home and rest quietly for the next 24 hours.  It is important to lie flat for the next 24 hours.  Get up only to go to the restroom.  You may lie in the bed or on a couch on your back, your stomach, your left side or your right side.  You may have one pillow under your head.  You may have pillows between your knees while you are on your side or under your knees while you are on your back.  DO NOT drive today.  Recline the seat as far back as it will go, while still wearing your seat belt, on the way home.  You may get up to go to the bathroom as needed.  You may sit up for 10 minutes to eat.  You may resume your normal diet and medications unless otherwise indicated.  The incidence of headache, nausea, or vomiting is about 5% (one in 20 patients).  If you develop a headache, lie flat and drink plenty of fluids until the headache goes away.  Caffeinated beverages may be helpful.  If you develop severe nausea and vomiting or a headache that does not go away with flat bed rest, call Dr Evelena Asa may resume normal activities after your 24 hours of bed rest is over; however, do not exert yourself strongly or do any heavy lifting tomorrow.  Call your physician for a follow-up appointment.  The results of your myelogram will be sent directly to your physician by the following day.  If you have any questions or if complications develop after you arrive home, please call Dr Christella Noa  Discharge instructions have been explained to the patient.  The patient, or the person responsible for the patient, fully understands these instructions.

## 2023-10-25 NOTE — Telephone Encounter (Signed)
 Tried to return patient call with no answer, mailbox is full so unable to leave message.

## 2023-10-29 DIAGNOSIS — E039 Hypothyroidism, unspecified: Secondary | ICD-10-CM | POA: Diagnosis not present

## 2023-10-29 DIAGNOSIS — G8929 Other chronic pain: Secondary | ICD-10-CM | POA: Diagnosis not present

## 2023-10-29 DIAGNOSIS — H9313 Tinnitus, bilateral: Secondary | ICD-10-CM | POA: Diagnosis not present

## 2023-10-29 DIAGNOSIS — Z136 Encounter for screening for cardiovascular disorders: Secondary | ICD-10-CM | POA: Diagnosis not present

## 2023-10-29 DIAGNOSIS — Z0000 Encounter for general adult medical examination without abnormal findings: Secondary | ICD-10-CM | POA: Diagnosis not present

## 2023-10-29 DIAGNOSIS — Z9481 Bone marrow transplant status: Secondary | ICD-10-CM | POA: Diagnosis not present

## 2023-10-29 DIAGNOSIS — Z23 Encounter for immunization: Secondary | ICD-10-CM | POA: Diagnosis not present

## 2023-10-29 DIAGNOSIS — G629 Polyneuropathy, unspecified: Secondary | ICD-10-CM | POA: Diagnosis not present

## 2023-10-29 DIAGNOSIS — Z7901 Long term (current) use of anticoagulants: Secondary | ICD-10-CM | POA: Diagnosis not present

## 2023-10-29 DIAGNOSIS — C9001 Multiple myeloma in remission: Secondary | ICD-10-CM | POA: Diagnosis not present

## 2023-10-31 DIAGNOSIS — R6889 Other general symptoms and signs: Secondary | ICD-10-CM | POA: Diagnosis not present

## 2023-10-31 DIAGNOSIS — C9000 Multiple myeloma not having achieved remission: Secondary | ICD-10-CM | POA: Diagnosis not present

## 2023-10-31 DIAGNOSIS — C9001 Multiple myeloma in remission: Secondary | ICD-10-CM | POA: Diagnosis not present

## 2023-10-31 DIAGNOSIS — M5416 Radiculopathy, lumbar region: Secondary | ICD-10-CM | POA: Diagnosis not present

## 2023-11-05 ENCOUNTER — Encounter: Payer: Self-pay | Admitting: Podiatry

## 2023-11-05 ENCOUNTER — Ambulatory Visit (INDEPENDENT_AMBULATORY_CARE_PROVIDER_SITE_OTHER): Admitting: Podiatry

## 2023-11-05 DIAGNOSIS — G629 Polyneuropathy, unspecified: Secondary | ICD-10-CM

## 2023-11-05 DIAGNOSIS — Z7901 Long term (current) use of anticoagulants: Secondary | ICD-10-CM

## 2023-11-05 DIAGNOSIS — T8484XA Pain due to internal orthopedic prosthetic devices, implants and grafts, initial encounter: Secondary | ICD-10-CM | POA: Diagnosis not present

## 2023-11-05 DIAGNOSIS — M79674 Pain in right toe(s): Secondary | ICD-10-CM

## 2023-11-05 DIAGNOSIS — M79675 Pain in left toe(s): Secondary | ICD-10-CM | POA: Diagnosis not present

## 2023-11-05 DIAGNOSIS — B351 Tinea unguium: Secondary | ICD-10-CM

## 2023-11-05 DIAGNOSIS — M4316 Spondylolisthesis, lumbar region: Secondary | ICD-10-CM | POA: Diagnosis not present

## 2023-11-05 DIAGNOSIS — G5601 Carpal tunnel syndrome, right upper limb: Secondary | ICD-10-CM | POA: Diagnosis not present

## 2023-11-05 MED ORDER — CICLOPIROX 8 % EX SOLN: Freq: Every day | CUTANEOUS | 2 refills | Status: DC

## 2023-11-05 MED ORDER — LIDOCAINE 5 % EX PTCH: 1.0000 | MEDICATED_PATCH | CUTANEOUS | 2 refills | Status: AC

## 2023-11-05 NOTE — Progress Notes (Unsigned)
 Subjective: Chief Complaint  Patient presents with   Foot Pain    Rm11 Patient requesting nails trimmed/ concerns about nail fungus bilateral toes/ numbness in toes for several years/gabapentin  with no improvement.    83 year old male presents the office today with the above concerns.  He is endorsing numbness to his feet.  He is on gabapentin  due to side effects he is having to take 100 mg at nighttime.  Since I saw him last he has had back surgery.  He is also asking for nail fungus.  His nails trimmed as well.  They are thick and elongated.  No recent injuries.  He is on warfarin.  No open lesions.  Objective: AAO x3, NAD DP/PT pulses palpable bilaterally, CRT less than 3 seconds Sensation decreased with Semmes Weinstein monofilament. There is no area pinpoint tenderness. Nails are hypertrophic, dystrophic, brittle, discolored, elongated 10. No surrounding redness or drainage. Tenderness nails 1-5 bilaterally. No open lesions or pre-ulcerative lesions are identified today. No pain with calf compression, swelling, warmth, erythema  Assessment: Symptomatic onychomycosis, neuropathy  Plan: Symptomatic onychomycosis -Sharply debrided the nails x 10 without any complications or bleeding  Neuropathy -He is to continue gabapentin .  We discussed alternative medications as well as topical medications that he can try.  Prescribed lidocaine .  Rodney [name] DPM

## 2023-11-06 DIAGNOSIS — L570 Actinic keratosis: Secondary | ICD-10-CM | POA: Diagnosis not present

## 2023-11-06 DIAGNOSIS — D485 Neoplasm of uncertain behavior of skin: Secondary | ICD-10-CM | POA: Diagnosis not present

## 2023-11-06 DIAGNOSIS — D225 Melanocytic nevi of trunk: Secondary | ICD-10-CM | POA: Diagnosis not present

## 2023-11-06 DIAGNOSIS — Z08 Encounter for follow-up examination after completed treatment for malignant neoplasm: Secondary | ICD-10-CM | POA: Diagnosis not present

## 2023-11-06 DIAGNOSIS — D045 Carcinoma in situ of skin of trunk: Secondary | ICD-10-CM | POA: Diagnosis not present

## 2023-11-06 DIAGNOSIS — Z85828 Personal history of other malignant neoplasm of skin: Secondary | ICD-10-CM | POA: Diagnosis not present

## 2023-11-06 DIAGNOSIS — L821 Other seborrheic keratosis: Secondary | ICD-10-CM | POA: Diagnosis not present

## 2023-11-06 DIAGNOSIS — L814 Other melanin hyperpigmentation: Secondary | ICD-10-CM | POA: Diagnosis not present

## 2023-11-15 ENCOUNTER — Other Ambulatory Visit: Payer: Medicare Other

## 2023-11-15 DIAGNOSIS — C61 Malignant neoplasm of prostate: Secondary | ICD-10-CM

## 2023-11-16 LAB — PSA: Prostate Specific Ag, Serum: <0.1 ng/mL (ref 0.0–4.0)

## 2023-11-19 DIAGNOSIS — Z7901 Long term (current) use of anticoagulants: Secondary | ICD-10-CM | POA: Diagnosis not present

## 2023-11-20 DIAGNOSIS — M47816 Spondylosis without myelopathy or radiculopathy, lumbar region: Secondary | ICD-10-CM | POA: Diagnosis not present

## 2023-11-22 ENCOUNTER — Encounter: Payer: Self-pay | Admitting: Urology

## 2023-11-22 ENCOUNTER — Ambulatory Visit: Payer: Medicare Other | Admitting: Urology

## 2023-11-22 VITALS — BP 81/56 | HR 68

## 2023-11-22 DIAGNOSIS — N3281 Overactive bladder: Secondary | ICD-10-CM | POA: Diagnosis not present

## 2023-11-22 DIAGNOSIS — C61 Malignant neoplasm of prostate: Secondary | ICD-10-CM

## 2023-11-22 LAB — URINALYSIS, ROUTINE W REFLEX MICROSCOPIC
Bilirubin, UA: NEGATIVE
Glucose, UA: NEGATIVE
Leukocytes,UA: NEGATIVE
Nitrite, UA: NEGATIVE
RBC, UA: NEGATIVE
Specific Gravity, UA: 1.025 (ref 1.005–1.030)
Urobilinogen, Ur: 1 mg/dL (ref 0.2–1.0)
pH, UA: 6 (ref 5.0–7.5)

## 2023-11-22 LAB — MICROSCOPIC EXAMINATION: Bacteria, UA: NONE SEEN

## 2023-11-22 MED ORDER — TOLTERODINE TARTRATE ER 4 MG PO CP24: 4.0000 mg | ORAL_CAPSULE | Freq: Every day | ORAL | 11 refills | Status: AC

## 2023-11-22 MED ORDER — MIRABEGRON ER 25 MG PO TB24: 25.0000 mg | ORAL_TABLET | Freq: Every day | ORAL | 11 refills | Status: AC

## 2023-11-22 NOTE — Progress Notes (Signed)
 11/22/2023 1:01 PM   Ozell JONETTA Lemmings Aug 02, 1940 [id number]  Referring provider: Center, [facility] [address] [place],  [place] [address]  Followup OAB and prostate cancer   HPI: Mr Rodney [name] is a 83yo here for followup for prostate cancer and OAB. PSA undetectable. He is doing well on mirabegron  and detrol . He has nocturia 1-2x. No straining to urinate. No UTIs since last visit.    PMH: Past Medical History:  Diagnosis Date   Anemia 02-16-11   01-05-11- post surgery-Transfusions x 4 units   Arthritis    Bone pain 02/18/2013   Cellulitis    Clotting disorder (HCC)    Complication of anesthesia 02-16-11   Pt. speaks of awakening during the surgery from back  surgery   Degenerative disc disease 02-16-11   01-05-11 L3 fusion done   Esophageal reflux 05/31/2008   Qualifier: Diagnosis of  By: Mavis MD, Norleen BRAVO    Hernia 02-16-11   left inguinal hernia at present   Hypothyroidism 02/01/2014   Multiple myeloma 02-16-11   suspected tumor  left sacral   Pancreatitis    Personal history of traumatic fracture 07/10/2012   Prostate cancer [facility])    Pulmonary embolism (HCC)    Second degree atrioventricular block 11/26/2012   Shortness of breath 02-16-11   hx. Pulmonary emboli x2 (9 yrs/ 3'12 -last)    Surgical History: Past Surgical History:  Procedure Laterality Date   APPENDECTOMY     CATARACT EXTRACTION     CHOLECYSTECTOMY  04/06/2010   laparoscopic-inflammation with stones   INGUINAL HERNIA REPAIR  02/20/2011   Procedure: HERNIA REPAIR INGUINAL ADULT;  Surgeon: Krystal CHRISTELLA Spinner, MD;  Location: [facility] ORS;  Service: General;  Laterality: Left;  Repair Left Inguinal Hernia with Mesh   LUMBAR DISC SURGERY  02-16-11    01-05-11 Lumbar surgery L3(complicated by loss of blood volume)/ 01-09-11 then Lumbar fusion done with retained  hardware    LUMBAR LAMINECTOMY/DECOMPRESSION MICRODISCECTOMY Right 01/16/2022   Procedure: Right Lumbar Five- Sacral One Laminectomy for  synovial/facet cyst;  Surgeon: Gillie Duncans, MD;  Location: [facility] OR;  Service: Neurosurgery;  Laterality: Right;  3C   PROSTATE SURGERY Bilateral 2011   TONSILLECTOMY AND ADENOIDECTOMY      Home Medications:  Allergies as of 11/22/2023       Reactions   Blood-group Specific Substance    Must be Leukocyte Reduced and Irradiated due to stem cell transplant.    Tolterodine  Other (See Comments)   Makes patient dizzy    Prednisone Other (See Comments)   Agitation  Anger        Medication List        Accurate as of November 22, 2023  1:01 PM. If you have any questions, ask your nurse or doctor.          acetaminophen  500 MG tablet Commonly known as: TYLENOL  Take 500-1,000 mg by mouth every 6 (six) hours as needed for headache (pain).   B-12 3000 MCG Caps Take 3,000 mcg by mouth daily.   Biotin  5 MG Caps Take 5 mg by mouth at bedtime.   bisacodyl  10 MG suppository Commonly known as: Dulcolax Place 1 suppository (10 mg total) rectally as needed for moderate constipation.   CALTRATE 600+D PO Take 1 tablet by mouth 2 (two) times daily.   cetirizine 10 MG tablet Commonly known as: ZYRTEC Take 10 mg by mouth at bedtime as needed for allergies. At night   ciclopirox  8 % solution Commonly known  as: PENLAC  Apply topically at bedtime. Apply over nail and surrounding skin. Apply daily over previous coat. After seven (7) days, may remove with alcohol  and continue cycle.   citalopram  10 MG tablet Commonly known as: CELEXA  Take 1 tablet (10 mg total) by mouth daily. What changed: when to take this   ESTER-C PO Take 1,000 mg by mouth daily.   gabapentin  300 MG capsule Commonly known as: NEURONTIN  TAKE 3 CAPSULES BY MOUTH AT BEDTIME**PATIENT NEEDS APT FOR FURTHER REFILLS OF THIS MEDICATION** What changed:  how much to take how to take this when to take this additional instructions   HYDROcodone -acetaminophen  5-325 MG tablet Commonly known as: NORCO/VICODIN Take 1-2  tablets by mouth every 4 (four) hours as needed for moderate pain.   levothyroxine  112 MCG tablet Commonly known as: SYNTHROID  Take 112 mcg by mouth daily before breakfast.   lidocaine  5 % ointment Commonly known as: XYLOCAINE  Apply 1 Application topically 3 (three) times daily as needed.   lidocaine  5 % Commonly known as: Lidoderm  Place 1 patch onto the skin daily. Remove & Discard patch within 12 hours or as directed by MD   mirabegron  ER 25 MG Tb24 tablet Commonly known as: Myrbetriq  Take 1 tablet (25 mg total) by mouth daily.   multivitamin with minerals Tabs tablet Take 1 tablet by mouth daily.   NASACORT  ALLERGY 24HR NA Place 2 sprays into the nose at bedtime as needed (congestion).   triamcinolone  55 MCG/ACT Aero nasal inhaler Commonly known as: Nasacort  Allergy 24HR Place 2 sprays into the nose daily as needed (allergies).   nitroGLYCERIN  0.2 mg/hr patch Commonly known as: NITRODUR - Dosed in mg/24 hr PLACE 1/4 TO 1/2 OF PATCH OVER AFFECTED REGION. REMOVE AND REPLACE ONCE DAILY. SLIGHTLY ALTER SKIN PLACEMENT DAILY What changed:  how much to take how to take this when to take this additional instructions   omeprazole  20 MG capsule Commonly known as: PRILOSEC TAKE 1 CAPSULE(20 MG) BY MOUTH TWICE DAILY   ondansetron  4 MG tablet Commonly known as: ZOFRAN  Take 4 mg by mouth every 8 (eight) hours as needed for nausea or vomiting.   oxycodone  5 MG capsule Commonly known as: OXY-IR Take 5 mg by mouth every 6 (six) hours as needed.   polyethylene glycol 17 g packet Commonly known as: MiraLax  Take 17 g by mouth 2 (two) times daily for 5 days, THEN 17 g daily. Start taking on: October 02, 2023   potassium chloride  SA 20 MEQ tablet Commonly known as: KLOR-CON  M TAKE 1 TABLET BY MOUTH EVERY DAY What changed: when to take this   ROBITUSSIN MAXIMUM STRENGTH PO Take 2.5 mLs by mouth daily as needed.   tiZANidine  4 MG tablet Commonly known as: ZANAFLEX  Take 1  tablet (4 mg total) by mouth every 6 (six) hours as needed for muscle spasms.   tiZANidine  4 MG tablet Commonly known as: ZANAFLEX  Take 1 tablet (4 mg total) by mouth every 6 (six) hours as needed for muscle spasms.   tolterodine  4 MG 24 hr capsule Commonly known as: DETROL  LA Take 1 capsule (4 mg total) by mouth at bedtime.   Tums Ultra 1000 1000 MG chewable tablet Generic drug: calcium  elemental as carbonate Chew 1,000-2,000 mg by mouth daily as needed for heartburn.   Vitamin D  1000 units capsule Take 1,000 Units by mouth 2 (two) times daily.   warfarin 2.5 MG tablet Commonly known as: COUMADIN  Take 2.5-5 mg by mouth at bedtime. Take 5 mg on Monday,  Wednesday & Friday and 2.5 mg all other days.        Allergies:  Allergies  Allergen Reactions   Blood-Group Specific Substance     Must be Leukocyte Reduced and Irradiated due to stem cell transplant.    Tolterodine  Other (See Comments)    Makes patient dizzy    Prednisone Other (See Comments)    Agitation  Anger    Family History: Family History  Problem Relation Age of Onset   Lymphoma Father 32   Prostate cancer Father    Clotting disorder Mother        blood clots   Deep vein thrombosis Mother    Lung disease Brother    Diabetes Son    Diabetes Daughter    Diabetes Daughter    Colon cancer Neg Hx    Esophageal cancer Neg Hx    Rectal cancer Neg Hx    Stomach cancer Neg Hx     Social History:  reports that he quit smoking about 54 years ago. His smoking use included cigarettes. He has never used smokeless tobacco. He reports that he does not drink alcohol  and does not use drugs.  ROS: All other review of systems were reviewed and are negative except what is noted above in HPI  Physical Exam: BP (!) 81/56   Pulse 68   Constitutional:  Alert and oriented, No acute distress. HEENT: [place] AT, moist mucus membranes.  Trachea midline, no masses. Cardiovascular: No clubbing, cyanosis, or edema. Respiratory:  Normal respiratory effort, no increased work of breathing. GI: Abdomen is soft, nontender, nondistended, no abdominal masses GU: No CVA tenderness.  Lymph: No cervical or inguinal lymphadenopathy. Skin: No rashes, bruises or suspicious lesions. Neurologic: Grossly intact, no focal deficits, moving all 4 extremities. Psychiatric: Normal mood and affect.  Laboratory Data: Lab Results  Component Value Date   WBC 7.2 09/28/2022   HGB 12.2 (L) 09/28/2022   HCT 36.6 (L) 09/28/2022   MCV 92.2 09/28/2022   PLT 192 09/28/2022    Lab Results  Component Value Date   CREATININE 1.03 09/24/2022    Lab Results  Component Value Date   PSA 0.00 (L) 09/25/2018   PSA 0.00 Repeated and verified X2. (L) 09/24/2016   PSA 0.00 Repeated and verified X2. (L) 02/17/2016    No results found for: TESTOSTERONE  No results found for: HGBA1C  Urinalysis    Component Value Date/Time   COLORURINE YELLOW 08/07/2022 2050   APPEARANCEUR Clear 11/21/2022 1513   LABSPEC 1.024 08/07/2022 2050   PHURINE 6.0 08/07/2022 2050   GLUCOSEU Negative 11/21/2022 1513   GLUCOSEU NEGATIVE 09/24/2016 1212   HGBUR NEGATIVE 08/07/2022 2050   BILIRUBINUR Negative 11/21/2022 1513   KETONESUR NEGATIVE 08/07/2022 2050   PROTEINUR 1+ (A) 11/21/2022 1513   PROTEINUR 30 (A) 08/07/2022 2050   UROBILINOGEN 0.2 09/24/2016 1212   NITRITE Negative 11/21/2022 1513   NITRITE NEGATIVE 08/07/2022 2050   LEUKOCYTESUR Negative 11/21/2022 1513   LEUKOCYTESUR NEGATIVE 08/07/2022 2050    Lab Results  Component Value Date   LABMICR See below: 11/21/2022   WBCUA 0-5 11/21/2022   LABEPIT 0-10 11/21/2022   MUCUS Presence of (A) 09/24/2016   BACTERIA None seen 11/21/2022    Pertinent Imaging:  No results found for this or any previous visit.  No results found for this or any previous visit.  No results found for this or any previous visit.  No results found for this or any previous visit.  No results  found for this  or any previous visit.  No results found for this or any previous visit.  No results found for this or any previous visit.  No results found for this or any previous visit.   Assessment & Plan:    1. Prostate cancer (HCC) (Primary) Followup 1 year with a PSA - Urinalysis, Routine w reflex microscopic  2. OAB (overactive bladder) Continue detrol  4mg  and mirabegron  25mg  daily   No follow-ups on file.  Belvie Clara, MD  [facility] [place]

## 2023-11-22 NOTE — Patient Instructions (Signed)

## 2023-11-26 ENCOUNTER — Ambulatory Visit: Payer: Self-pay | Admitting: Urology

## 2023-12-17 DIAGNOSIS — Z7901 Long term (current) use of anticoagulants: Secondary | ICD-10-CM | POA: Diagnosis not present

## 2023-12-19 DIAGNOSIS — G5601 Carpal tunnel syndrome, right upper limb: Secondary | ICD-10-CM | POA: Diagnosis not present

## 2023-12-19 DIAGNOSIS — G5621 Lesion of ulnar nerve, right upper limb: Secondary | ICD-10-CM | POA: Diagnosis not present

## 2023-12-24 DIAGNOSIS — G5601 Carpal tunnel syndrome, right upper limb: Secondary | ICD-10-CM | POA: Diagnosis not present

## 2023-12-24 DIAGNOSIS — G5621 Lesion of ulnar nerve, right upper limb: Secondary | ICD-10-CM | POA: Diagnosis not present

## 2024-03-02 ENCOUNTER — Ambulatory Visit (INDEPENDENT_AMBULATORY_CARE_PROVIDER_SITE_OTHER)

## 2024-03-02 ENCOUNTER — Ambulatory Visit: Admitting: Podiatry

## 2024-03-02 DIAGNOSIS — M7751 Other enthesopathy of right foot: Secondary | ICD-10-CM | POA: Diagnosis not present

## 2024-03-02 DIAGNOSIS — G629 Polyneuropathy, unspecified: Secondary | ICD-10-CM

## 2024-03-02 DIAGNOSIS — M79675 Pain in left toe(s): Secondary | ICD-10-CM | POA: Diagnosis not present

## 2024-03-02 DIAGNOSIS — M79674 Pain in right toe(s): Secondary | ICD-10-CM

## 2024-03-02 DIAGNOSIS — Z7901 Long term (current) use of anticoagulants: Secondary | ICD-10-CM | POA: Diagnosis not present

## 2024-03-02 DIAGNOSIS — B351 Tinea unguium: Secondary | ICD-10-CM | POA: Diagnosis not present

## 2024-03-02 DIAGNOSIS — M722 Plantar fascial fibromatosis: Secondary | ICD-10-CM | POA: Diagnosis not present

## 2024-03-02 DIAGNOSIS — M21371 Foot drop, right foot: Secondary | ICD-10-CM | POA: Diagnosis not present

## 2024-03-02 NOTE — Progress Notes (Unsigned)
 Subjective: Chief Complaint  Patient presents with   rfc    PATIENT PRESENTS TODAY FOR rfc PATIENT RELATES TO BE EXPERIENCING SOME PAIN ON HIS R FOOT     83 year old male presents the office today with the above concerns.  States his nails are thick and elongated has difficulty trimming them himself.  No open lesions.   He has a secondary concerns of pain to the right foot.  He points to the top of the foot and ankle as well where he is any discomfort but he does not report any injuries.  No recent treatment.   He is endorsing numbness to his feet.  He is on gabapentin  due to side effects he is having to take 100 mg at nighttime.  Since I saw him last he has had back surgery.  He is on warfarin.  No open lesions.  Objective: AAO x3, NAD DP/PT pulses palpable bilaterally, CRT less than 3 seconds Sensation decreased with Semmes Weinstein monofilament. There is no area pinpoint tenderness. Nails are hypertrophic, dystrophic, brittle, discolored, elongated 10. No surrounding redness or drainage. Tenderness nails 1-5 bilaterally. No open lesions or pre-ulcerative lesions are identified today. Decreased ankle dorsiflexion, dropfoot present on the right foot.  There is diffuse discomfort on the dorsal aspect of the foot as well as the ankle joint but not able to appreciate any area of pinpoint tenderness.  There is no significant increase in edema there is no erythema. No pain with calf compression, swelling, warmth, erythema  Assessment: Symptomatic onychomycosis, neuropathy, capsulitis/dropfoot right side  Plan: Symptomatic onychomycosis -Sharply debrided the nails x 10 without any complications or bleeding  Neuropathy -He is to continue gabapentin .  We discussed alternative medications as well as topical medications that he can try.  Prescribed lidocaine .  Right ankle pain, dropfoot - X-rays were obtained and reviewed with the patient.  Multiple views of the foot, ankle were  obtained.  There is no evidence of acute fracture noted today.  Likely old avulsion fractures noted on the medial ankle. Calcaneal spurring is present.  Some spurring present at the lateral view noted as well.  Surgical clips on the plantar foot. - I do think his symptoms are related to his gait instability as well as dropfoot.  I have recommended the dropfoot, AFO brace.  I will order one for him.  He is a size 12.  Continue shoes, good support.  Discussed topical medications that he can use.  Donnice JONELLE Fees DPM   Size 12 dropfoot brace

## 2024-03-09 ENCOUNTER — Other Ambulatory Visit: Payer: Self-pay | Admitting: Podiatry

## 2024-03-25 ENCOUNTER — Telehealth: Payer: Self-pay | Admitting: Podiatry

## 2024-03-25 NOTE — Telephone Encounter (Signed)
 Patient requested to have provider contact him, due to not receiving brace for right foot [phone]

## 2024-03-26 NOTE — Telephone Encounter (Signed)
 I spoke to Rodney [name] and let him know the brace is ordered and we are waiting for it to come in. I told him I will call him as soon as we get it. He stated he is good for now and doesn't need to speak to Dr. Gershon.

## 2024-04-03 NOTE — Telephone Encounter (Signed)
 Patient called back this morning, asking for an update on his brace. Some concern for memory issues - patient states that he called last week and no one called him back [phone]

## 2024-06-01 ENCOUNTER — Ambulatory Visit: Admitting: Podiatry

## 2024-11-06 ENCOUNTER — Other Ambulatory Visit

## 2024-11-27 ENCOUNTER — Ambulatory Visit: Admitting: Urology
